# Patient Record
Sex: Female | Born: 1955
Health system: Southern US, Community
[De-identification: ages and names within clinical notes are randomized; demographics above are authoritative.]

## PROBLEM LIST (undated history)

## (undated) DIAGNOSIS — J449 Chronic obstructive pulmonary disease, unspecified: Secondary | ICD-10-CM

## (undated) DIAGNOSIS — M1990 Unspecified osteoarthritis, unspecified site: Secondary | ICD-10-CM

## (undated) DIAGNOSIS — I639 Cerebral infarction, unspecified: Secondary | ICD-10-CM

## (undated) DIAGNOSIS — Z9289 Personal history of other medical treatment: Secondary | ICD-10-CM

## (undated) DIAGNOSIS — I2510 Atherosclerotic heart disease of native coronary artery without angina pectoris: Secondary | ICD-10-CM

## (undated) DIAGNOSIS — M810 Age-related osteoporosis without current pathological fracture: Secondary | ICD-10-CM

## (undated) DIAGNOSIS — H409 Unspecified glaucoma: Secondary | ICD-10-CM

## (undated) DIAGNOSIS — IMO0002 Reserved for concepts with insufficient information to code with codable children: Secondary | ICD-10-CM

## (undated) DIAGNOSIS — I209 Angina pectoris, unspecified: Secondary | ICD-10-CM

## (undated) DIAGNOSIS — R1013 Epigastric pain: Secondary | ICD-10-CM

## (undated) DIAGNOSIS — F32A Depression, unspecified: Secondary | ICD-10-CM

## (undated) DIAGNOSIS — J45909 Unspecified asthma, uncomplicated: Secondary | ICD-10-CM

## (undated) DIAGNOSIS — E039 Hypothyroidism, unspecified: Secondary | ICD-10-CM

## (undated) DIAGNOSIS — I2699 Other pulmonary embolism without acute cor pulmonale: Secondary | ICD-10-CM

## (undated) DIAGNOSIS — K50811 Crohn's disease of both small and large intestine with rectal bleeding: Secondary | ICD-10-CM

## (undated) DIAGNOSIS — I669 Occlusion and stenosis of unspecified cerebral artery: Secondary | ICD-10-CM

## (undated) DIAGNOSIS — J309 Allergic rhinitis, unspecified: Secondary | ICD-10-CM

## (undated) DIAGNOSIS — E785 Hyperlipidemia, unspecified: Secondary | ICD-10-CM

## (undated) DIAGNOSIS — K219 Gastro-esophageal reflux disease without esophagitis: Secondary | ICD-10-CM

## (undated) DIAGNOSIS — I69922 Dysarthria following unspecified cerebrovascular disease: Secondary | ICD-10-CM

## (undated) DIAGNOSIS — I69919 Unspecified symptoms and signs involving cognitive functions following unspecified cerebrovascular disease: Secondary | ICD-10-CM

## (undated) DIAGNOSIS — H269 Unspecified cataract: Secondary | ICD-10-CM

## (undated) DIAGNOSIS — I10 Essential (primary) hypertension: Secondary | ICD-10-CM

## (undated) DIAGNOSIS — K5090 Crohn's disease, unspecified, without complications: Secondary | ICD-10-CM

## (undated) DIAGNOSIS — Z87442 Personal history of urinary calculi: Secondary | ICD-10-CM

## (undated) DIAGNOSIS — R0600 Dyspnea, unspecified: Secondary | ICD-10-CM

## (undated) DIAGNOSIS — C50919 Malignant neoplasm of unspecified site of unspecified female breast: Secondary | ICD-10-CM

## (undated) DIAGNOSIS — B001 Herpesviral vesicular dermatitis: Secondary | ICD-10-CM

## (undated) DIAGNOSIS — D689 Coagulation defect, unspecified: Secondary | ICD-10-CM

## (undated) DIAGNOSIS — F329 Major depressive disorder, single episode, unspecified: Secondary | ICD-10-CM

## (undated) DIAGNOSIS — Z923 Personal history of irradiation: Secondary | ICD-10-CM

## (undated) HISTORY — DX: Hyperlipidemia, unspecified: E78.5

## (undated) HISTORY — DX: Atherosclerotic heart disease of native coronary artery without angina pectoris: I25.10

## (undated) HISTORY — DX: Crohn's disease of both small and large intestine with rectal bleeding: K50.811

## (undated) HISTORY — PX: FRACTURE SURGERY: SHX138

## (undated) HISTORY — DX: Allergic rhinitis, unspecified: J30.9

## (undated) HISTORY — PX: BREAST SURGERY: SHX581

## (undated) HISTORY — DX: Unspecified glaucoma: H40.9

## (undated) HISTORY — DX: Other pulmonary embolism without acute cor pulmonale: I26.99

## (undated) HISTORY — DX: Major depressive disorder, single episode, unspecified: F32.9

## (undated) HISTORY — PX: EYE SURGERY: SHX253

## (undated) HISTORY — DX: Cerebral infarction, unspecified: I63.9

## (undated) HISTORY — DX: Malignant neoplasm of unspecified site of unspecified female breast: C50.919

## (undated) HISTORY — DX: Coagulation defect, unspecified: D68.9

## (undated) HISTORY — PX: CARDIAC CATHETERIZATION: SHX172

## (undated) HISTORY — DX: Occlusion and stenosis of unspecified cerebral artery: I66.9

## (undated) HISTORY — PX: TUBAL LIGATION: SHX77

## (undated) HISTORY — DX: Unspecified cataract: H26.9

## (undated) HISTORY — DX: Age-related osteoporosis without current pathological fracture: M81.0

## (undated) HISTORY — DX: Depression, unspecified: F32.A

## (undated) HISTORY — DX: Dysarthria following unspecified cerebrovascular disease: I69.922

## (undated) HISTORY — DX: Hypothyroidism, unspecified: E03.9

## (undated) HISTORY — DX: Personal history of other medical treatment: Z92.89

## (undated) HISTORY — DX: Gastro-esophageal reflux disease without esophagitis: K21.9

## (undated) HISTORY — DX: Herpesviral vesicular dermatitis: B00.1

## (undated) HISTORY — DX: Epigastric pain: R10.13

## (undated) HISTORY — DX: Essential (primary) hypertension: I10

## (undated) HISTORY — DX: Reserved for concepts with insufficient information to code with codable children: IMO0002

## (undated) HISTORY — PX: FINGER SURGERY: SHX640

## (undated) HISTORY — DX: Crohn's disease, unspecified, without complications: K50.90

## (undated) HISTORY — DX: Unspecified symptoms and signs involving cognitive functions following unspecified cerebrovascular disease: I69.919

## (undated) HISTORY — DX: Unspecified asthma, uncomplicated: J45.909

---

## 2004-08-17 ENCOUNTER — Ambulatory Visit: Payer: Self-pay | Admitting: General Surgery

## 2004-09-07 ENCOUNTER — Ambulatory Visit: Payer: Self-pay | Admitting: Oncology

## 2004-09-20 ENCOUNTER — Ambulatory Visit: Payer: Self-pay | Admitting: Oncology

## 2004-09-20 DIAGNOSIS — C50919 Malignant neoplasm of unspecified site of unspecified female breast: Secondary | ICD-10-CM

## 2004-09-20 DIAGNOSIS — Z923 Personal history of irradiation: Secondary | ICD-10-CM

## 2004-09-20 HISTORY — PX: BREAST BIOPSY: SHX20

## 2004-09-20 HISTORY — DX: Malignant neoplasm of unspecified site of unspecified female breast: C50.919

## 2004-09-20 HISTORY — DX: Personal history of irradiation: Z92.3

## 2004-09-20 HISTORY — PX: BREAST LUMPECTOMY: SHX2

## 2004-09-28 ENCOUNTER — Ambulatory Visit: Payer: Self-pay | Admitting: Family Medicine

## 2004-12-15 ENCOUNTER — Emergency Department: Payer: Self-pay | Admitting: Emergency Medicine

## 2004-12-18 ENCOUNTER — Ambulatory Visit: Payer: Self-pay | Admitting: Internal Medicine

## 2005-01-11 ENCOUNTER — Ambulatory Visit: Payer: Self-pay | Admitting: Internal Medicine

## 2005-01-28 ENCOUNTER — Ambulatory Visit: Payer: Self-pay | Admitting: Internal Medicine

## 2005-04-05 ENCOUNTER — Ambulatory Visit: Payer: Self-pay | Admitting: Oncology

## 2005-04-20 ENCOUNTER — Ambulatory Visit: Payer: Self-pay | Admitting: Oncology

## 2005-07-26 ENCOUNTER — Ambulatory Visit: Payer: Self-pay | Admitting: General Surgery

## 2005-12-27 ENCOUNTER — Ambulatory Visit: Payer: Self-pay | Admitting: Oncology

## 2006-01-18 ENCOUNTER — Ambulatory Visit: Payer: Self-pay | Admitting: Oncology

## 2006-05-15 ENCOUNTER — Observation Stay: Payer: Self-pay

## 2006-05-15 ENCOUNTER — Observation Stay (HOSPITAL_COMMUNITY): Admission: AD | Admit: 2006-05-15 | Discharge: 2006-05-17 | Payer: Self-pay | Admitting: Neurology

## 2006-07-15 ENCOUNTER — Ambulatory Visit: Payer: Self-pay | Admitting: Oncology

## 2006-07-27 ENCOUNTER — Ambulatory Visit: Payer: Self-pay | Admitting: General Surgery

## 2007-01-18 DIAGNOSIS — E785 Hyperlipidemia, unspecified: Secondary | ICD-10-CM | POA: Insufficient documentation

## 2007-01-18 DIAGNOSIS — E7800 Pure hypercholesterolemia, unspecified: Secondary | ICD-10-CM

## 2007-01-18 DIAGNOSIS — I10 Essential (primary) hypertension: Secondary | ICD-10-CM | POA: Insufficient documentation

## 2007-01-18 HISTORY — DX: Pure hypercholesterolemia, unspecified: E78.00

## 2007-01-19 ENCOUNTER — Ambulatory Visit: Payer: Self-pay | Admitting: Oncology

## 2007-02-01 ENCOUNTER — Other Ambulatory Visit: Payer: Self-pay

## 2007-02-01 ENCOUNTER — Emergency Department: Payer: Self-pay | Admitting: Emergency Medicine

## 2007-02-10 ENCOUNTER — Ambulatory Visit: Payer: Self-pay | Admitting: Oncology

## 2007-02-19 ENCOUNTER — Ambulatory Visit: Payer: Self-pay | Admitting: Oncology

## 2007-05-23 DIAGNOSIS — K219 Gastro-esophageal reflux disease without esophagitis: Secondary | ICD-10-CM | POA: Insufficient documentation

## 2007-09-21 ENCOUNTER — Ambulatory Visit: Payer: Self-pay | Admitting: Oncology

## 2007-09-28 ENCOUNTER — Ambulatory Visit: Payer: Self-pay | Admitting: Oncology

## 2007-10-02 ENCOUNTER — Ambulatory Visit: Payer: Self-pay | Admitting: Oncology

## 2007-10-22 ENCOUNTER — Ambulatory Visit: Payer: Self-pay | Admitting: Oncology

## 2008-03-20 ENCOUNTER — Ambulatory Visit: Payer: Self-pay | Admitting: Oncology

## 2008-04-03 ENCOUNTER — Ambulatory Visit: Payer: Self-pay | Admitting: Oncology

## 2008-04-20 ENCOUNTER — Ambulatory Visit: Payer: Self-pay | Admitting: Oncology

## 2008-06-12 DIAGNOSIS — E039 Hypothyroidism, unspecified: Secondary | ICD-10-CM | POA: Insufficient documentation

## 2008-06-20 ENCOUNTER — Ambulatory Visit: Payer: Self-pay | Admitting: Oncology

## 2008-10-21 ENCOUNTER — Ambulatory Visit: Payer: Self-pay | Admitting: Oncology

## 2008-11-04 ENCOUNTER — Ambulatory Visit: Payer: Self-pay | Admitting: Oncology

## 2008-11-08 ENCOUNTER — Ambulatory Visit: Payer: Self-pay | Admitting: Oncology

## 2008-11-18 ENCOUNTER — Ambulatory Visit: Payer: Self-pay | Admitting: Oncology

## 2009-04-28 DIAGNOSIS — I69359 Hemiplegia and hemiparesis following cerebral infarction affecting unspecified side: Secondary | ICD-10-CM | POA: Insufficient documentation

## 2009-05-01 ENCOUNTER — Ambulatory Visit: Payer: Self-pay | Admitting: Family Medicine

## 2009-05-21 ENCOUNTER — Ambulatory Visit: Payer: Self-pay | Admitting: Oncology

## 2009-05-22 ENCOUNTER — Ambulatory Visit: Payer: Self-pay | Admitting: Oncology

## 2009-06-20 ENCOUNTER — Ambulatory Visit: Payer: Self-pay | Admitting: Oncology

## 2009-10-20 DIAGNOSIS — M810 Age-related osteoporosis without current pathological fracture: Secondary | ICD-10-CM | POA: Insufficient documentation

## 2009-10-21 ENCOUNTER — Ambulatory Visit: Payer: Self-pay

## 2009-11-18 ENCOUNTER — Ambulatory Visit: Payer: Self-pay | Admitting: Oncology

## 2009-12-18 ENCOUNTER — Ambulatory Visit: Payer: Self-pay | Admitting: Oncology

## 2009-12-19 ENCOUNTER — Ambulatory Visit: Payer: Self-pay | Admitting: Oncology

## 2010-01-20 DIAGNOSIS — I69919 Unspecified symptoms and signs involving cognitive functions following unspecified cerebrovascular disease: Secondary | ICD-10-CM | POA: Insufficient documentation

## 2010-02-18 ENCOUNTER — Ambulatory Visit: Payer: Self-pay | Admitting: Oncology

## 2010-03-19 ENCOUNTER — Ambulatory Visit: Payer: Self-pay | Admitting: Oncology

## 2010-03-20 ENCOUNTER — Ambulatory Visit: Payer: Self-pay | Admitting: Oncology

## 2010-04-20 ENCOUNTER — Ambulatory Visit: Payer: Self-pay | Admitting: Oncology

## 2010-04-22 DIAGNOSIS — I69922 Dysarthria following unspecified cerebrovascular disease: Secondary | ICD-10-CM | POA: Insufficient documentation

## 2010-09-24 ENCOUNTER — Ambulatory Visit: Payer: Self-pay | Admitting: Oncology

## 2010-10-21 ENCOUNTER — Ambulatory Visit: Payer: Self-pay | Admitting: Oncology

## 2011-04-05 ENCOUNTER — Ambulatory Visit: Payer: Self-pay | Admitting: Oncology

## 2011-04-08 ENCOUNTER — Ambulatory Visit: Payer: Self-pay | Admitting: Oncology

## 2011-04-21 ENCOUNTER — Ambulatory Visit: Payer: Self-pay | Admitting: Oncology

## 2011-08-29 ENCOUNTER — Emergency Department: Payer: Self-pay | Admitting: Unknown Physician Specialty

## 2012-04-05 ENCOUNTER — Ambulatory Visit: Payer: Self-pay | Admitting: Oncology

## 2012-04-12 ENCOUNTER — Ambulatory Visit: Payer: Self-pay | Admitting: Oncology

## 2012-04-12 LAB — CBC CANCER CENTER
Basophil #: 0 x10 3/mm (ref 0.0–0.1)
Basophil %: 0.8 %
Eosinophil #: 0.2 x10 3/mm (ref 0.0–0.7)
Eosinophil %: 2.7 %
HCT: 36.7 % (ref 35.0–47.0)
HGB: 11.9 g/dL — ABNORMAL LOW (ref 12.0–16.0)
Lymphocyte #: 1.9 x10 3/mm (ref 1.0–3.6)
Lymphocyte %: 33.4 %
MCH: 28.6 pg (ref 26.0–34.0)
MCHC: 32.4 g/dL (ref 32.0–36.0)
MCV: 88 fL (ref 80–100)
Monocyte #: 0.5 x10 3/mm (ref 0.2–0.9)
Monocyte %: 9.2 %
Neutrophil #: 3 x10 3/mm (ref 1.4–6.5)
Neutrophil %: 53.9 %
Platelet: 186 x10 3/mm (ref 150–440)
RBC: 4.16 10*6/uL (ref 3.80–5.20)
RDW: 14.6 % — ABNORMAL HIGH (ref 11.5–14.5)
WBC: 5.7 x10 3/mm (ref 3.6–11.0)

## 2012-04-12 LAB — COMPREHENSIVE METABOLIC PANEL
Albumin: 3.6 g/dL (ref 3.4–5.0)
Alkaline Phosphatase: 88 U/L (ref 50–136)
Anion Gap: 6 — ABNORMAL LOW (ref 7–16)
BUN: 11 mg/dL (ref 7–18)
Bilirubin,Total: 0.3 mg/dL (ref 0.2–1.0)
Calcium, Total: 9.3 mg/dL (ref 8.5–10.1)
Chloride: 106 mmol/L (ref 98–107)
Co2: 30 mmol/L (ref 21–32)
Creatinine: 0.9 mg/dL (ref 0.60–1.30)
EGFR (African American): >60
EGFR (Non-African Amer.): >60
Glucose: 110 mg/dL — ABNORMAL HIGH (ref 65–99)
Osmolality: 283 (ref 275–301)
Potassium: 3.8 mmol/L (ref 3.5–5.1)
SGOT(AST): 16 U/L (ref 15–37)
SGPT (ALT): 28 U/L
Sodium: 142 mmol/L (ref 136–145)
Total Protein: 7.5 g/dL (ref 6.4–8.2)

## 2012-04-20 ENCOUNTER — Ambulatory Visit: Payer: Self-pay | Admitting: Oncology

## 2012-10-06 ENCOUNTER — Emergency Department: Payer: Self-pay | Admitting: Emergency Medicine

## 2012-10-07 LAB — COMPREHENSIVE METABOLIC PANEL
Albumin: 4.2 g/dL (ref 3.4–5.0)
Alkaline Phosphatase: 88 U/L (ref 50–136)
Anion Gap: 7 (ref 7–16)
BUN: 13 mg/dL (ref 7–18)
Bilirubin,Total: 0.4 mg/dL (ref 0.2–1.0)
Calcium, Total: 9.8 mg/dL (ref 8.5–10.1)
Chloride: 106 mmol/L (ref 98–107)
Co2: 26 mmol/L (ref 21–32)
Creatinine: 0.67 mg/dL (ref 0.60–1.30)
EGFR (African American): >60
EGFR (Non-African Amer.): >60
Glucose: 106 mg/dL — ABNORMAL HIGH (ref 65–99)
Osmolality: 278 (ref 275–301)
Potassium: 3.4 mmol/L — ABNORMAL LOW (ref 3.5–5.1)
SGOT(AST): 16 U/L (ref 15–37)
SGPT (ALT): 21 U/L (ref 12–78)
Sodium: 139 mmol/L (ref 136–145)
Total Protein: 7.9 g/dL (ref 6.4–8.2)

## 2012-10-07 LAB — CBC
HCT: 37.4 % (ref 35.0–47.0)
HGB: 12.5 g/dL (ref 12.0–16.0)
MCH: 28.8 pg (ref 26.0–34.0)
MCHC: 33.5 g/dL (ref 32.0–36.0)
MCV: 86 fL (ref 80–100)
Platelet: 202 10*3/uL (ref 150–440)
RBC: 4.35 10*6/uL (ref 3.80–5.20)
RDW: 14.6 % — ABNORMAL HIGH (ref 11.5–14.5)
WBC: 6.7 10*3/uL (ref 3.6–11.0)

## 2012-10-07 LAB — CK TOTAL AND CKMB (NOT AT ARMC)
CK, Total: 107 U/L (ref 21–215)
CK, Total: 97 U/L (ref 21–215)
CK-MB: 0.7 ng/mL (ref 0.5–3.6)
CK-MB: <0.5 ng/mL — ABNORMAL LOW (ref 0.5–3.6)

## 2012-10-07 LAB — TROPONIN I
Troponin-I: <0.02 ng/mL
Troponin-I: <0.02 ng/mL

## 2012-10-19 ENCOUNTER — Ambulatory Visit: Payer: Self-pay | Admitting: Internal Medicine

## 2012-10-25 ENCOUNTER — Ambulatory Visit: Payer: Self-pay | Admitting: Internal Medicine

## 2012-11-07 ENCOUNTER — Ambulatory Visit (INDEPENDENT_AMBULATORY_CARE_PROVIDER_SITE_OTHER): Payer: Medicare Other | Admitting: Pulmonary Disease

## 2012-11-07 ENCOUNTER — Encounter: Payer: Self-pay | Admitting: Pulmonary Disease

## 2012-11-07 VITALS — BP 122/64 | HR 96 | Temp 97.8°F | Ht 60.0 in | Wt 137.0 lb

## 2012-11-07 DIAGNOSIS — R0609 Other forms of dyspnea: Secondary | ICD-10-CM | POA: Insufficient documentation

## 2012-11-07 DIAGNOSIS — R0600 Dyspnea, unspecified: Secondary | ICD-10-CM | POA: Insufficient documentation

## 2012-11-07 DIAGNOSIS — R0602 Shortness of breath: Secondary | ICD-10-CM

## 2012-11-07 DIAGNOSIS — R911 Solitary pulmonary nodule: Secondary | ICD-10-CM

## 2012-11-07 MED ORDER — BUDESONIDE-FORMOTEROL FUMARATE 160-4.5 MCG/ACT IN AERO: 2.0000 | INHALATION_SPRAY | Freq: Two times a day (BID) | RESPIRATORY_TRACT | Status: DC

## 2012-11-07 MED ORDER — ALBUTEROL SULFATE HFA 108 (90 BASE) MCG/ACT IN AERS: 2.0000 | INHALATION_SPRAY | Freq: Four times a day (QID) | RESPIRATORY_TRACT | Status: DC | PRN

## 2012-11-07 NOTE — Assessment & Plan Note (Addendum)
Repeat CT scan of chest in 12 months (January 2015). We will need to order this on the next visit.

## 2012-11-07 NOTE — Progress Notes (Signed)
Subjective:    Patient ID: Carolyn Campbell, female    DOB: March 18, 1956, 57 y.o.   MRN: [id number]  HPI And is-year-old female with a past medical history significant for she and hypertension who comes to our clinic for evaluation of a fast respiratory rate. She states that she had no childhood respiratory illnesses and was diagnosed with asthma approximately 10 years ago. She tells me she never smoked cigarettes. She lives with a smoker. In the last 2 months she has developed increasing shortness of breath with exertion. She states that she can make it through the grocery store if she "leans on the basket". She has difficulty cleaning or doing things in the house. About one month ago she started noticing back and chest pain which radiated to her left arm. She was evaluated at [facility] and underwent a left heart catheterization echocardiogram which she tells me were normal. Her shortness of breath develop not long after this and she went to [facility] for a violation. She was evaluated there with a hospitalization which lasted one night. She ruled out for an MI, she underwent a CT MG a chest which was negative for pulmonary embolism but there was a small, 3 mm pulmonary nodule. She had normal chemistries with the exception of a slightly high calcium at 10.7. Her bicarbonate value was 24 on chemistries a [employer]. Her pro BNP was 27. Since her discharge at [facility] she has gone back for evaluation in the emergency room and reassured and told to go home. Her symptoms have persisted. She states that she gets short of breath with talking and with exertion. When she is at rest things feel better. She still has some chest and back tightness. She states that they're known terminal triggers which make her shortness of breath worse. It is mostly just with exertion and talking. She states that she took inhalers approximately 10 years ago around the time of her diagnosis of asthma. It did help with shortness of breath.  She's not been on any controller medications lately. She states that the breathing treatment she has received during hospitalizations recently have been beneficial.   Past Medical History  Diagnosis Date  . Asthma   . Hypertension   . Hyperlipidemia   . Stroke   . Allergic rhinitis      Family History  Problem Relation Age of Onset  . Heart disease Mother   . Breast cancer Sister      History   Social History  . Marital Status: Married    Spouse Name: N/A    Number of Children: N/A  . Years of Education: N/A   Occupational History  . Not on file.   Social History Main Topics  . Smoking status: Never Smoker   . Smokeless tobacco: Never Used  . Alcohol Use: No  . Drug Use: No  . Sexually Active: Not on file   Other Topics Concern  . Not on file   Social History Narrative  . No narrative on file     Allergies  Allergen Reactions  . Morphine And Related      No outpatient prescriptions prior to visit.   No facility-administered medications prior to visit.   Home medications  synthroid  benazapril  lipitor  alendrontate amolidipne Bayer zoloft   Review of Systems  Constitutional: Negative for fever and unexpected weight change.  HENT: Negative for ear pain, nosebleeds, congestion, sore throat, rhinorrhea, sneezing, trouble swallowing, dental problem, postnasal drip and  sinus pressure.   Eyes: Negative for redness and itching.  Respiratory: Positive for cough, chest tightness, shortness of breath and wheezing.   Cardiovascular: Negative for palpitations and leg swelling.  Gastrointestinal: Negative for nausea and vomiting.  Genitourinary: Negative for dysuria.  Musculoskeletal: Negative for joint swelling.  Skin: Negative for rash.  Neurological: Negative for headaches.  Hematological: Does not bruise/bleed easily.  Psychiatric/Behavioral: Negative for dysphoric mood. The patient is not nervous/anxious.        Objective:   Physical  Exam  Filed Vitals:   11/07/12 1543  BP: 122/64  Pulse: 96  Temp: 97.8 F (36.6 C)  TempSrc: Oral  Height: 5' (1.524 m)  Weight: 137 lb (62.143 kg)  SpO2: 100%  room air  Gen: no acute distress when I enter the room, tachypnic with speaking in HEENT: NCAT, PERRL, EOMi, OP clear, neck supple without masses PULM: CTA B, no accessory muscle use, no paradoxical breathing CV: RRR, no mgr, no JVD AB: BS+, soft, nontender, no hsm Ext: warm, no edema, no clubbing, no cyanosis Derm: no rash or skin breakdown Neuro: A&Ox4, left facial droop, strength 5/5 hand grip, hip flexion R side; 4/5 for both of these on L   January 2014 left heart catheterization by Dr. Jerrye Beavers normal per patient January 2014 echocardiogram by Dr. Jerrye Beavers normal per patient January 2014 CT angio chest at [facility] no evidence of pulmonary embolism, 3 mm right upper lobe pulmonary nodule, no lymphadenopathy January 2014 labwork at [facility] includes a pro BNP value of 27, hemoglobin of 12.7, d-dimer of 741 (elevated) calcium of 10.7 bicarbonate 24 (normal)  11/07/2012 simple spirometry poor patient effort, uninterpretable     Assessment & Plan:   Shortness of breath It is unclear to me what is causing Carolyn Campbell shortness of breath. She has had an extensive evaluation in the last month including a left heart catheterization, a CT angiogram chest, and an echocardiogram which apparently are all normal. I am reassured that there is no clear evidence of pulmonary parenchymal disease seen on the CT scan of her chest. We need to obtain records from Dr. Donnamarie Rossetti to confirm that the left heart catheterization and the echocardiogram are normal.  She was not able to fully cooperate with the simple spirometry test today and so it's results are uninterpretable.  Though she exhibits increased work of breathing on occasion while speaking with me she uses no accessory muscles and does not show signs of diaphragmatic  weakness and that there is no paradoxical breathing. When I leave the room the breathing difficulty goes away and she is breathing comfortably when I come back. She has no signs of neuromuscular weakness on physical exam.   Perhaps we are dealing with a restrictive lung disease which is best evaluated with full pulmonary function testing.  The next best test to evaluate her shortness of breath is a full pulmonary function tests with lung volumes. We will request that this be ordered at [facility].  Because she has a history of asthma I will add Symbicort twice a day and when necessary albuterol to see if this helps with her subjective dyspnea. To be honest, because she is not wheezing I do not think these will make much of a difference.  Overall I am encouraged by the extensive negative work up she has received in the last few months, but I am uncertain as to the etiology of her symptoms.   Plan : -Trial of  Symbicort twice a day -Trial of albuterol when necessary -Full pulmonary function testing at [facility] -Return to clinic in 4 weeks  Solitary pulmonary nodule Repeat CT scan of chest in 12 months (January 2015). We will need to order this on the next visit.    Updated Medication List Outpatient Encounter Prescriptions as of 11/07/2012  Medication Sig Dispense Refill  . alendronate (FOSAMAX) 70 MG tablet Take 1 tablet by mouth once a week.      Marland Kitchen amLODipine (NORVASC) 10 MG tablet Take 1 tablet by mouth daily.      Marland Kitchen aspirin 81 MG tablet Take 81 mg by mouth daily.      Marland Kitchen atorvastatin (LIPITOR) 40 MG tablet Take 1 tablet by mouth daily.      . benazepril (LOTENSIN) 20 MG tablet Take 1 tablet by mouth daily.      Marland Kitchen levothyroxine (SYNTHROID, LEVOTHROID) 50 MCG tablet Take 1 tablet by mouth daily.      . sertraline (ZOLOFT) 100 MG tablet Take 100 mg by mouth daily.      Marland Kitchen albuterol (PROVENTIL HFA;VENTOLIN HFA) 108 (90 BASE) MCG/ACT inhaler  Inhale 2 puffs into the lungs every 6 (six) hours as needed for wheezing.  1 Inhaler  0  . budesonide-formoterol (SYMBICORT) 160-4.5 MCG/ACT inhaler Inhale 2 puffs into the lungs 2 (two) times daily.  1 Inhaler  0   No facility-administered encounter medications on file as of 11/07/2012.

## 2012-11-07 NOTE — Patient Instructions (Signed)
Take the symbicort inhaler two puffs twice a day no matter how you feel Pick up a prescription for albuterol and use two puffs every four hours as needed We will send you to [facility] for a breathing test Go to the ER if your breathing gets worse We will see you back in one month or sooner if needed

## 2012-11-07 NOTE — Assessment & Plan Note (Addendum)
It is unclear to me what is causing Carolyn Campbell shortness of breath. She has had an extensive evaluation in the last month including a left heart catheterization, a CT angiogram chest, and an echocardiogram which apparently are all normal. I am reassured that there is no clear evidence of pulmonary parenchymal disease seen on the CT scan of her chest. We need to obtain records from Dr. Donnamarie Rossetti to confirm that the left heart catheterization and the echocardiogram are normal.  She was not able to fully cooperate with the simple spirometry test today and so it's results are uninterpretable.  Though she exhibits increased work of breathing on occasion while speaking with me she uses no accessory muscles and does not show signs of diaphragmatic weakness and that there is no paradoxical breathing. When I leave the room the breathing difficulty goes away and she is breathing comfortably when I come back. She has no signs of neuromuscular weakness on physical exam.   Perhaps we are dealing with a restrictive lung disease which is best evaluated with full pulmonary function testing.  The next best test to evaluate her shortness of breath is a full pulmonary function tests with lung volumes. We will request that this be ordered at [facility].  Because she has a history of asthma I will add Symbicort twice a day and when necessary albuterol to see if this helps with her subjective dyspnea. To be honest, because she is not wheezing I do not think these will make much of a difference.  Overall I am encouraged by the extensive negative work up she has received in the last few months, but I am uncertain as to the etiology of her symptoms.   Plan : -Trial of Symbicort twice a day -Trial of albuterol when necessary -Full pulmonary function testing at [facility] -Return to clinic in 4 weeks

## 2012-11-08 ENCOUNTER — Telehealth: Payer: Self-pay | Admitting: *Deleted

## 2012-11-08 NOTE — Telephone Encounter (Signed)
Error

## 2012-11-14 ENCOUNTER — Ambulatory Visit: Payer: Self-pay | Admitting: Pulmonary Disease

## 2012-11-14 LAB — PULMONARY FUNCTION TEST

## 2012-11-17 ENCOUNTER — Telehealth: Payer: Self-pay | Admitting: Pulmonary Disease

## 2012-11-17 MED ORDER — BUDESONIDE-FORMOTEROL FUMARATE 160-4.5 MCG/ACT IN AERO: 2.0000 | INHALATION_SPRAY | Freq: Two times a day (BID) | RESPIRATORY_TRACT | Status: DC

## 2012-11-17 NOTE — Telephone Encounter (Signed)
Rx has been sent in, pt is aware. 

## 2012-11-22 ENCOUNTER — Encounter: Payer: Self-pay | Admitting: Pulmonary Disease

## 2012-12-05 ENCOUNTER — Encounter: Payer: Self-pay | Admitting: Pulmonary Disease

## 2012-12-05 ENCOUNTER — Ambulatory Visit (INDEPENDENT_AMBULATORY_CARE_PROVIDER_SITE_OTHER): Payer: Medicare Other | Admitting: Pulmonary Disease

## 2012-12-05 VITALS — BP 120/70 | HR 91 | Temp 98.9°F | Ht 60.0 in | Wt 138.4 lb

## 2012-12-05 DIAGNOSIS — R0602 Shortness of breath: Secondary | ICD-10-CM

## 2012-12-05 DIAGNOSIS — R911 Solitary pulmonary nodule: Secondary | ICD-10-CM

## 2012-12-05 NOTE — Assessment & Plan Note (Signed)
It is unclear to me exactly what caused her elevated respiratory rate in January and February.  She had an extensive work up that was normal.    She is better now that she is on Symbicort, but it is not clear to me that she has any degree of obstructive airways disease.  She really needs PFT's and a methacholine challenge test but she requests that we not order any more tests now due to her extensive medical bills.  I seriously question whether or not her tachypnea in January/February 2014 was psychogenic.  It is really not possible for someone to have a legitimate, organic, underlying progressive process driving tachypnea for weeks on end without eventual respiratory failure.  Plan: -needs PFTs when she can afford it, spirometry minimal; ideally full PFT with methacholine challenge -continue Symbicort for now, try to change to inhaled corticosteroid alone next visit if still well

## 2012-12-05 NOTE — Assessment & Plan Note (Signed)
A 62m nodule was seen on her January 2014 CT chest.  We will order a repeat for 12 months time (Jan 2015).

## 2012-12-05 NOTE — Progress Notes (Signed)
Subjective:    Patient ID: Carolyn Campbell, female    DOB: 10/12/1955, 57 y.o.   MRN: [id number]  Synopsis: Carolyn Campbell first saw the [facility] office in February 2014 after multiple hospitalizations and ER visits for tachypnea. She had been seen at both [facility] and [facility] and had an extensive workup which was entirely negative. This included a left heart catheterization, CT angiogram chest multiple chest x-rays, and an echocardiogram. In February 2014 when she saw [place] for the first time the [facility] Campbell clinic she was noted to be tachypneic when the examiner was in the room but this apparently subsided undergone. She was unable to complete pulmonary function testing due to her tachypnea. She was started on Symbicort empirically because she had a distant history of asthma.   HPI  12/05/2012 routine office visit--Carolyn Campbell has been doing well since her last visit. She is taking the Symbicort twice a day and states that it has helped greatly with her shortness of breath and wheezing. She has only had to use albuterol 2 times since the last visit. She states that she sometimes get short of breath when she exerts her self but in general she is much better than before. She is not exercising on a regular basis and states that she does not feel as strong as she did when she previously participated in a regular exercise routine. She is not coughing. She has not had weight loss, fever, or chills. She is currently suffering from an episode of gastroenteritis causing diarrhea.  Past Medical History  Diagnosis Date  . Asthma   . Hypertension   . Hyperlipidemia   . Stroke   . Allergic rhinitis      Review of Systems  Constitutional: Negative for fever, chills and fatigue.  HENT: Negative for congestion, rhinorrhea and postnasal drip.   Respiratory: Negative for cough, shortness of breath and wheezing.   Cardiovascular: Negative for chest pain, palpitations and  leg swelling.  Gastrointestinal: Positive for nausea, vomiting and diarrhea.       Objective:   Physical Exam  Filed Vitals:   12/05/12 1607  BP: 120/70  Pulse: 91  Temp: 98.9 F (37.2 C)  TempSrc: Oral  Height: 5' (1.524 m)  Weight: 138 lb 6.4 oz (62.778 kg)  SpO2: 98%   Room Air  Gen: well appearing, no acute distress HEENT: NCAT, EOMi, OP clear, PULM: CTA B CV: RRR, no mgr, no JVD AB: BS+, soft, nontender, no hsm Ext: warm, no edema, no clubbing, no cyanosis  January 2014 left heart catheterization by Dr. Jerrye Beavers normal per patient January 2014 echocardiogram by Dr. Jerrye Beavers at normal per patient January 2014 CT angio chest at [facility] no evidence of pulmonary embolism, 3 mm right upper lobe pulmonary nodule, no lymphadenopathy January 2014 labwork at [facility] includes a pro BNP value of 27, hemoglobin of 12.7, d-dimer of 741 (elevated) calcium of 10.7 bicarbonate 24 (normal) 11/07/2012 simple spirometry poor patient effort, uninterpretable 11/14/2012 unable to complete PFTs, did not follow commands      Assessment & Plan:   Solitary pulmonary nodule A 29m nodule was seen on her January 2014 CT chest.  We will order a repeat for 12 months time (Jan 2015).  Shortness of breath It is unclear to me exactly what caused her elevated respiratory rate in January and February.  She had an extensive work up that was normal.    She is better now that  she is on Symbicort, but it is not clear to me that she has any degree of obstructive airways disease.  She really needs PFT's and a methacholine challenge test but she requests that we not order any more tests now due to her extensive medical bills.  I seriously question whether or not her tachypnea in January/February 2014 was psychogenic.  It is really not possible for someone to have a legitimate, organic, underlying progressive process driving tachypnea for weeks on end without eventual respiratory  failure.  Plan: -needs PFTs when she can afford it, spirometry minimal; ideally full PFT with methacholine challenge -continue Symbicort for now, try to change to inhaled corticosteroid alone next visit if still well   Updated Medication List Outpatient Encounter Prescriptions as of 12/05/2012  Medication Sig Dispense Refill  . albuterol (PROVENTIL HFA;VENTOLIN HFA) 108 (90 BASE) MCG/ACT inhaler Inhale 2 puffs into the lungs every 6 (six) hours as needed for wheezing.  1 Inhaler  0  . alendronate (FOSAMAX) 70 MG tablet Take 1 tablet by mouth once a week.      Marland Kitchen amLODipine (NORVASC) 10 MG tablet Take 1 tablet by mouth daily.      Marland Kitchen aspirin 81 MG tablet Take 81 mg by mouth daily.      Marland Kitchen atorvastatin (LIPITOR) 40 MG tablet Take 1 tablet by mouth daily.      . benazepril (LOTENSIN) 20 MG tablet Take 1 tablet by mouth daily.      . budesonide-formoterol (SYMBICORT) 160-4.5 MCG/ACT inhaler Inhale 2 puffs into the lungs 2 (two) times daily.  1 Inhaler  5  . levothyroxine (SYNTHROID, LEVOTHROID) 50 MCG tablet Take 1 tablet by mouth daily.      . sertraline (ZOLOFT) 100 MG tablet Take 100 mg by mouth daily.       No facility-administered encounter medications on file as of 12/05/2012.

## 2012-12-05 NOTE — Patient Instructions (Signed)
We will have you get a CT scan in January 2014 to evaluate the pulmonary nodule Keep taking the Symbicort as you are doing We will see you back in 3-4 months or sooner if needed

## 2012-12-11 ENCOUNTER — Encounter: Payer: Self-pay | Admitting: Pulmonary Disease

## 2012-12-12 ENCOUNTER — Encounter: Payer: Self-pay | Admitting: Pulmonary Disease

## 2012-12-18 ENCOUNTER — Telehealth: Payer: Self-pay | Admitting: Pulmonary Disease

## 2012-12-18 MED ORDER — BUDESONIDE-FORMOTEROL FUMARATE 160-4.5 MCG/ACT IN AERO: 2.0000 | INHALATION_SPRAY | Freq: Two times a day (BID) | RESPIRATORY_TRACT | Status: DC

## 2012-12-18 NOTE — Telephone Encounter (Signed)
I spoke with pt. She is needing rx for symbicort sent to [facility]. rx has been printed off. Magda Paganini will have Dr. Lake Bells sign this and will fax this tomorrow in [place]. Pt aware. Nothing further was needed

## 2013-03-06 ENCOUNTER — Ambulatory Visit: Payer: Medicare Other | Admitting: Pulmonary Disease

## 2013-04-02 ENCOUNTER — Ambulatory Visit: Payer: Medicare Other | Admitting: Pulmonary Disease

## 2013-04-24 ENCOUNTER — Encounter: Payer: Self-pay | Admitting: Pulmonary Disease

## 2013-04-24 ENCOUNTER — Ambulatory Visit (INDEPENDENT_AMBULATORY_CARE_PROVIDER_SITE_OTHER): Payer: Medicare Other | Admitting: Pulmonary Disease

## 2013-04-24 VITALS — BP 140/72 | HR 72 | Temp 98.3°F | Ht 60.0 in | Wt 141.0 lb

## 2013-04-24 DIAGNOSIS — R0602 Shortness of breath: Secondary | ICD-10-CM

## 2013-04-24 DIAGNOSIS — R911 Solitary pulmonary nodule: Secondary | ICD-10-CM

## 2013-04-24 NOTE — Patient Instructions (Signed)
We will order a CT scan of your chest at [facility] in January 2015 to evaluate the pulmonary nodule  Stop taking the Symbicort and replace it with the Aerospan inhaler I gave you.  Take one puff twice a day for a month.  At the end of the month let us know how you are doing.  If you are doing OK on it then let us know so that we can call in a prescription for this new medicine.  If you are not better then resume the symbicort regularly  We will see you back in January 2015

## 2013-04-24 NOTE — Assessment & Plan Note (Signed)
Symptoms been stable since the last visit. It is possible she has mild asthma. She cannot afford a methacholine challenge which I think would provide useful information. Notably, she has never really given as acceptable pulmonary function testing.  Plan: -We will try a trial of inhaled corticosteroid alone, she will call us if she does okay on this. -If her symptoms worsen on the inhaled corticosteroid and she is to restart Symbicort twice a day and use it regularly.

## 2013-04-24 NOTE — Assessment & Plan Note (Signed)
Repeat CT chest in January 2015

## 2013-04-24 NOTE — Progress Notes (Signed)
Subjective:    Patient ID: Carolyn Campbell, female    DOB: Sep 26, 1955, 57 y.o.   MRN: [id number]  Synopsis: Carolyn Campbell first saw the [facility] office in February 2014 after multiple hospitalizations and ER visits for tachypnea. She had been seen at both [facility] and [facility] and had an extensive workup which was entirely negative. This included a left heart catheterization, CT angiogram chest multiple chest x-rays, and an echocardiogram. In February 2014 when she saw [place] for the first time the [facility] clinic she was noted to be tachypneic when the examiner was in the room but this apparently subsided undergone. She was unable to complete pulmonary function testing due to her tachypnea. She was started on Symbicort empirically because she had a distant history of asthma.   HPI   12/05/2012 routine office visit--Carolyn Campbell has been doing well since her last visit. She is taking the Symbicort twice a day and states that it has helped greatly with her shortness of breath and wheezing. She has only had to use albuterol 2 times since the last visit. She states that she sometimes get short of breath when she exerts her self but in general she is much better than before. She is not exercising on a regular basis and states that she does not feel as strong as she did when she previously participated in a regular exercise routine. She is not coughing. She has not had weight loss, fever, or chills. She is currently suffering from an episode of gastroenteritis causing diarrhea.  04/24/2013 ROV >> This is been a stable interval for Carolyn Campbell. She is only using the Symbicort on occasion and usually at night. She does not take it regularly and only uses as needed. Her breathing has been better since when I first met her in February 2014. She has not had use her rescue inhaler. Sometimes she feels that she cannot breathe very well and she is lying on her left side.    Past  Medical History  Diagnosis Date  . Asthma   . Hypertension   . Hyperlipidemia   . Stroke   . Allergic rhinitis      Review of Systems  Constitutional: Negative for fever, chills and fatigue.  HENT: Negative for congestion, rhinorrhea and postnasal drip.   Respiratory: Negative for cough, shortness of breath and wheezing.   Cardiovascular: Negative for chest pain, palpitations and leg swelling.  Gastrointestinal: Positive for nausea, vomiting and diarrhea.       Objective:   Physical Exam   Filed Vitals:   04/24/13 1000  BP: 140/72  Pulse: 72  Temp: 98.3 F (36.8 C)  TempSrc: Oral  Height: 5' (1.524 m)  Weight: 141 lb (63.957 kg)  SpO2: 97%   Room Air  Gen: well appearing, no acute distress HEENT: NCAT, EOMi, OP clear,  PULM: CTA B CV: RRR, no mgr, no JVD AB: BS+, soft, nontender, no hsm Ext: warm, no edema, no clubbing, no cyanosis  January 2014 left heart catheterization by Dr. Jerrye Beavers normal per patient January 2014 echocardiogram by Dr. Jerrye Beavers at normal per patient January 2014 CT angio chest at [facility] no evidence of pulmonary embolism, 3 mm right upper lobe pulmonary nodule, no lymphadenopathy January 2014 labwork at [facility] includes a pro BNP value of 27, hemoglobin of 12.7, d-dimer of 741 (elevated) calcium of 10.7 bicarbonate 24 (normal) 11/07/2012 simple spirometry poor patient effort, uninterpretable 11/14/2012 unable to complete PFTs, did  not follow commands      Assessment & Plan:   Shortness of breath Symptoms been stable since the last visit. It is possible she has mild asthma. She cannot afford a methacholine challenge which I think would provide useful information. Notably, she has never really given as acceptable pulmonary function testing.  Plan: -We will try a trial of inhaled corticosteroid alone, she will call us if she does okay on this. -If her symptoms worsen on the inhaled corticosteroid and she is to restart  Symbicort twice a day and use it regularly.  Solitary pulmonary nodule Repeat CT chest in January 2015    Updated Medication List Outpatient Encounter Prescriptions as of 04/24/2013  Medication Sig Dispense Refill  . albuterol (PROVENTIL HFA;VENTOLIN HFA) 108 (90 BASE) MCG/ACT inhaler Inhale 2 puffs into the lungs every 6 (six) hours as needed for wheezing.  1 Inhaler  0  . amLODipine (NORVASC) 10 MG tablet Take 1 tablet by mouth daily.      Marland Kitchen aspirin 81 MG tablet Take 81 mg by mouth daily.      Marland Kitchen atorvastatin (LIPITOR) 40 MG tablet Take 1 tablet by mouth daily.      . benazepril (LOTENSIN) 20 MG tablet Take 1 tablet by mouth daily.      . budesonide-formoterol (SYMBICORT) 160-4.5 MCG/ACT inhaler Inhale 2 puffs into the lungs 2 (two) times daily.  1 Inhaler  6  . levothyroxine (SYNTHROID, LEVOTHROID) 50 MCG tablet Take 1 tablet by mouth daily.      . sertraline (ZOLOFT) 100 MG tablet Take 100 mg by mouth daily.      . [DISCONTINUED] alendronate (FOSAMAX) 70 MG tablet Take 1 tablet by mouth once a week.       No facility-administered encounter medications on file as of 04/24/2013.

## 2013-04-27 ENCOUNTER — Telehealth: Payer: Self-pay | Admitting: Pulmonary Disease

## 2013-04-27 NOTE — Telephone Encounter (Signed)
Spoke with patient-- Patient seen by Dr. Kendrick Fries 04/24/13 Patient Instructions    We will order a CT scan of your chest at [facility] in January 2015 to evaluate the pulmonary nodule  Stop taking the Symbicort and replace it with the Aerospan inhaler I gave you. Take one puff twice a day for a month. At the end of the month let us know how you are doing. If you are doing OK on it then let us know so that we can call in a prescription for this new medicine. If you are not better then resume the symbicort regularly  We will see you back in January 2015   ------------- Pt states every time she uses the aerospan she coughs Today she woke up with sore throat unsure if she is catching a cold or if this is related to [place] Dr. Maple Hudson please advise as Dr. Kendrick Fries is out of the office, thank you  Allergies  Allergen Reactions  . Morphine And Related    Current Outpatient Prescriptions on File Prior to Visit  Medication Sig Dispense Refill  . albuterol (PROVENTIL HFA;VENTOLIN HFA) 108 (90 BASE) MCG/ACT inhaler Inhale 2 puffs into the lungs every 6 (six) hours as needed for wheezing.  1 Inhaler  0  . amLODipine (NORVASC) 10 MG tablet Take 1 tablet by mouth daily.      Marland Kitchen aspirin 81 MG tablet Take 81 mg by mouth daily.      Marland Kitchen atorvastatin (LIPITOR) 40 MG tablet Take 1 tablet by mouth daily.      . benazepril (LOTENSIN) 20 MG tablet Take 1 tablet by mouth daily.      . budesonide-formoterol (SYMBICORT) 160-4.5 MCG/ACT inhaler Inhale 2 puffs into the lungs 2 (two) times daily.  1 Inhaler  6  . levothyroxine (SYNTHROID, LEVOTHROID) 50 MCG tablet Take 1 tablet by mouth daily.      . sertraline (ZOLOFT) 100 MG tablet Take 100 mg by mouth daily.       No current facility-administered medications on file prior to visit.

## 2013-04-27 NOTE — Telephone Encounter (Signed)
Per CDY:  If she still has symbicort, go back to that 2 puffs and rinse bid until we can reach Dr. Kendrick Fries next week.    -------  Carolyn Campbell, spoke with pt.  Informed her of above per CY.  She verbalized understanding of this and does still have symbicort.  She will go back to symbicort 2 puffs bid until instructed further.  Will send msg to Dr. Kendrick Fries to advise on further recs.

## 2013-05-01 NOTE — Telephone Encounter (Signed)
noted 

## 2013-05-29 ENCOUNTER — Ambulatory Visit: Payer: Self-pay | Admitting: Family Medicine

## 2013-07-26 ENCOUNTER — Other Ambulatory Visit: Payer: Self-pay

## 2013-08-20 ENCOUNTER — Telehealth: Payer: Self-pay | Admitting: *Deleted

## 2013-08-20 DIAGNOSIS — R911 Solitary pulmonary nodule: Secondary | ICD-10-CM

## 2013-08-20 NOTE — Telephone Encounter (Signed)
Message copied by Christen Butter on Mon Aug 20, 2013 10:22 AM ------      Message from: Christen Butter      Created: Tue Dec 05, 2012  4:23 PM       Pt neds non con ct chest in Jan 2015 to f/u pulmonary nodule ------

## 2013-08-20 NOTE — Telephone Encounter (Signed)
Order sent to [facility] for ct chest in Jan 2015.

## 2013-09-20 HISTORY — PX: ESOPHAGOGASTRODUODENOSCOPY: SHX1529

## 2013-09-20 HISTORY — PX: GIVENS CAPSULE STUDY: SHX5432

## 2013-09-20 HISTORY — PX: COLONOSCOPY: SHX174

## 2013-09-24 ENCOUNTER — Telehealth: Payer: Self-pay | Admitting: Pulmonary Disease

## 2013-09-24 NOTE — Telephone Encounter (Signed)
Spoke with pt. She reports she doesn't drive to [facility], so she needs her CT cancelled that is scheduled over at [facility] on 10/01/13 and r/s to be done in [place]. Please advise PCC's thanks

## 2013-09-25 NOTE — Telephone Encounter (Signed)
pcc's aware and precert has been issused for [employer]

## 2013-09-28 ENCOUNTER — Telehealth: Payer: Self-pay | Admitting: Pulmonary Disease

## 2013-10-01 ENCOUNTER — Other Ambulatory Visit: Payer: Medicare Other

## 2013-10-01 NOTE — Telephone Encounter (Signed)
Per [employer] pt has to see pcp, pt has appt today, then they have to do a referral for dr [facility] and once that is done precert can be completed,in the mean time ct has been moved to 10/04/13@11 :30am@[facility]  pt is aware of this process Joellen Jersey

## 2013-10-08 ENCOUNTER — Encounter: Payer: Self-pay | Admitting: Pulmonary Disease

## 2013-10-09 ENCOUNTER — Telehealth: Payer: Self-pay | Admitting: Pulmonary Disease

## 2013-10-10 NOTE — Telephone Encounter (Signed)
Had to reschedule the ct for [email or web address]  location 10/12/13@4 :00pm pt has been notified Joellen Jersey

## 2013-10-12 ENCOUNTER — Ambulatory Visit: Payer: Self-pay | Admitting: Pulmonary Disease

## 2013-10-19 ENCOUNTER — Encounter: Payer: Self-pay | Admitting: Pulmonary Disease

## 2013-10-19 ENCOUNTER — Ambulatory Visit (INDEPENDENT_AMBULATORY_CARE_PROVIDER_SITE_OTHER): Payer: Medicare HMO | Admitting: Pulmonary Disease

## 2013-10-19 VITALS — BP 122/68 | HR 75 | Temp 98.0°F | Ht 60.0 in | Wt 138.0 lb

## 2013-10-19 DIAGNOSIS — R0602 Shortness of breath: Secondary | ICD-10-CM

## 2013-10-19 DIAGNOSIS — D3500 Benign neoplasm of unspecified adrenal gland: Secondary | ICD-10-CM

## 2013-10-19 DIAGNOSIS — R911 Solitary pulmonary nodule: Secondary | ICD-10-CM

## 2013-10-19 LAB — BASIC METABOLIC PANEL
BUN: 13 mg/dL (ref 6–23)
CO2: 30 mEq/L (ref 19–32)
Calcium: 9.6 mg/dL (ref 8.4–10.5)
Chloride: 103 mEq/L (ref 96–112)
Creatinine, Ser: 0.7 mg/dL (ref 0.4–1.2)
GFR: 104.61 mL/min (ref >60.00)
Glucose, Bld: 101 mg/dL — ABNORMAL HIGH (ref 70–99)
Potassium: 3.9 mEq/L (ref 3.5–5.1)
Sodium: 139 mEq/L (ref 135–145)

## 2013-10-19 MED ORDER — ALBUTEROL SULFATE HFA 108 (90 BASE) MCG/ACT IN AERS: 2.0000 | INHALATION_SPRAY | Freq: Four times a day (QID) | RESPIRATORY_TRACT | Status: DC | PRN

## 2013-10-19 NOTE — Assessment & Plan Note (Signed)
This was seen on the CT of her chest and was incompletely imaged. Plan: -CT abdomen with contrast to evaluate further

## 2013-10-19 NOTE — Progress Notes (Signed)
Subjective:    Patient ID: Carolyn Campbell, female    DOB: [birth year], 58 y.o.   MRN: [id number]  Synopsis: [name] first saw the [facility] office in February 2014 after multiple hospitalizations and ER visits for tachypnea. She had been seen at both [facility] and [facility] and had an extensive workup which was entirely negative. This included a left heart catheterization, CT angiogram chest multiple chest x-rays, and an echocardiogram. In February 2014 when she saw [place] for the first time the [facility] clinic she was noted to be tachypneic when the examiner was in the room but this apparently subsided undergone. She was unable to complete pulmonary function testing due to her tachypnea. She was started on Symbicort empirically because she had a distant history of asthma.   HPI   12/05/2012 routine office visit--Carolyn Campbell has been doing well since her last visit. She is taking the Symbicort twice a day and states that it has helped greatly with her shortness of breath and wheezing. She has only had to use albuterol 2 times since the last visit. She states that she sometimes get short of breath when she exerts her self but in general she is much better than before. She is not exercising on a regular basis and states that she does not feel as strong as she did when she previously participated in a regular exercise routine. She is not coughing. She has not had weight loss, fever, or chills. She is currently suffering from an episode of gastroenteritis causing diarrhea.  04/24/2013 ROV >> This is been a stable interval for [name]. She is only using the Symbicort on occasion and usually at night. She does not take it regularly and only uses as needed. Her breathing has been better since when I first met her in February 2014. She has not had use her rescue inhaler. Sometimes she feels that she cannot breathe very well and she is lying on her left side.  10/19/2013  ROV >> Carolyn Campbell says that her breathing is better since the last visit. She sometimes feels like hard candy gets stuck in her throat and sometimes if she "speaks out loud" she gets choked.  She still gets some shortness of breath with exercise like fast walking and climbing a flight of stairs. She sometimes get hoarseness. She continues to use the symbicort two puffs twice a day.  She sometimes misses an occassional dose maybe twice a week.  She tried another inhaler after the last visit that made her cough more.   Past Medical History  Diagnosis Date  . Asthma   . Hypertension   . Hyperlipidemia   . Stroke   . Allergic rhinitis      Review of Systems  Constitutional: Negative for fever, chills and fatigue.  HENT: Negative for congestion, postnasal drip and rhinorrhea.   Respiratory: Negative for cough, shortness of breath and wheezing.   Cardiovascular: Negative for chest pain, palpitations and leg swelling.  Gastrointestinal: Positive for nausea, vomiting and diarrhea.       Objective:   Physical Exam   Filed Vitals:   10/19/13 0933  BP: 122/68  Pulse: 75  Temp: 98 F (36.7 C)  TempSrc: Oral  Height: 5' (1.524 m)  Weight: 138 lb (62.596 kg)  SpO2: 97%   Room Air  Gen: well appearing, no acute distress HEENT: NCAT, EOMi, OP clear,  PULM: CTA B CV: RRR, no mgr, no JVD AB: BS+, soft, nontender, no  hsm Ext: warm, no edema, no clubbing, no cyanosis  January 2014 left heart catheterization by Dr. Jerrye Beavers normal per patient January 2014 echocardiogram by Dr. Jerrye Beavers at normal per patient January 2014 CT angio chest at [facility] no evidence of pulmonary embolism, 3 mm right upper lobe pulmonary nodule, no lymphadenopathy January 2014 labwork at [facility] includes a pro BNP value of 27, hemoglobin of 12.7, d-dimer of 741 (elevated) calcium of 10.7 bicarbonate 24 (normal) 11/07/2012 simple spirometry poor patient effort, uninterpretable 11/14/2012 unable to complete  PFTs, did not follow commands      Assessment & Plan:   Solitary pulmonary nodule Resolved  Adrenal adenoma This was seen on the CT of her chest and was incompletely imaged. Plan: -CT abdomen with contrast to evaluate further  Shortness of breath It is really not clear to me if she has asthma or not.  I strongly suspect VCD and or other anxiety related issues as she has had a lengthy work up which was negative and her exams and vitals are always normal.  If she has asthma it is mild at best and it is currently over treated with the strong dose of symbicort.  Plan: -stop symbicort -use Aerospan inhaler 2 puffs twice a day -if this doesn't work, go back to Symbicort 80/4.5 (NOT 160/4.5) -instructed on Albuterol PRN use and prescribed -goal 2015 is to get to prn albuterol alone    Updated Medication List Outpatient Encounter Prescriptions as of 10/19/2013  Medication Sig  . albuterol (PROVENTIL HFA;VENTOLIN HFA) 108 (90 BASE) MCG/ACT inhaler Inhale 2 puffs into the lungs every 6 (six) hours as needed for wheezing.  Marland Kitchen amLODipine (NORVASC) 10 MG tablet Take 1 tablet by mouth daily.  Marland Kitchen aspirin 81 MG tablet Take 81 mg by mouth daily.  Marland Kitchen atorvastatin (LIPITOR) 40 MG tablet Take 1 tablet by mouth daily.  . benazepril (LOTENSIN) 20 MG tablet Take 1 tablet by mouth daily.  . budesonide-formoterol (SYMBICORT) 160-4.5 MCG/ACT inhaler Inhale 2 puffs into the lungs 2 (two) times daily.  Marland Kitchen levothyroxine (SYNTHROID, LEVOTHROID) 50 MCG tablet Take 1 tablet by mouth daily.  . sertraline (ZOLOFT) 100 MG tablet Take 100 mg by mouth daily.

## 2013-10-19 NOTE — Patient Instructions (Signed)
Stop symbicort Start using the Aerospan two puffs twice a day no matter how you feel Let us know how you are doing on the [place] and we will send in a new prescription for this  Use the albuterol (rescue) inhaler 2 puffs every 4-6 hours AS NEEDED FOR SHORTNESS OF BREATH  We will order a contrasted CT of your abdomen to evaluate the adrenal nodule  We will see you back in 3-4 months or sooner if needed

## 2013-10-19 NOTE — Assessment & Plan Note (Signed)
Resolved

## 2013-10-19 NOTE — Assessment & Plan Note (Addendum)
It is really not clear to me if she has asthma or not.  I strongly suspect VCD and or other anxiety related issues as she has had a lengthy work up which was negative and her exams and vitals are always normal.  If she has asthma it is mild at best and it is currently over treated with the strong dose of symbicort.  Plan: -stop symbicort -use Aerospan inhaler 2 puffs twice a day -if this doesn't work, go back to Symbicort 80/4.5 (NOT 160/4.5) -instructed on Albuterol PRN use and prescribed -goal 2015 is to get to prn albuterol alone

## 2013-10-22 NOTE — Addendum Note (Signed)
Addended by: Len Blalock on: 10/22/2013 01:51 PM   Modules accepted: Orders

## 2013-10-30 ENCOUNTER — Telehealth: Payer: Self-pay | Admitting: Pulmonary Disease

## 2013-10-30 NOTE — Telephone Encounter (Signed)
Will forward to [facility]

## 2013-10-31 ENCOUNTER — Telehealth: Payer: Self-pay | Admitting: Pulmonary Disease

## 2013-10-31 DIAGNOSIS — D3500 Benign neoplasm of unspecified adrenal gland: Secondary | ICD-10-CM

## 2013-10-31 NOTE — Telephone Encounter (Signed)
lmomtcb x1 for teresa

## 2013-10-31 NOTE — Telephone Encounter (Signed)
Rec'd a call from [facility] outpatient [facility] regarding pt's CT.  Needs a different order for tomorrow's CT.  [name] can be reached at [phone].  [name]

## 2013-10-31 NOTE — Telephone Encounter (Signed)
I spoke with Juliann Pulse and she states that they are needing the precert for the pt CT for tomorrow. I spoke with Golden Circle and she has Psychologist, counselling for [employer] and will work on this and will [employer]. I will forward the note to [facility]. [place] Bing, CMA

## 2013-10-31 NOTE — Telephone Encounter (Signed)
Needs to speak to libby in ref to this pt says she left a vm also can be reached at [phone].Elnita Maxwell

## 2013-10-31 NOTE — Telephone Encounter (Signed)
  Spoke with [name] and the radiologists and he is asking that the CT abdomen be done without contrast as well as with. This is what is best to view what is needed. Order placed and Golden Circle is aware to do precert. [place] Bing, CMA I ATC diane x3, NA, no voicemail. WCB. [place] Bing, CMA

## 2013-10-31 NOTE — Telephone Encounter (Signed)
Auth# [id number] given to Damaris Hippo Ottinger

## 2013-11-01 NOTE — Telephone Encounter (Signed)
Received numerous call from [facility] this has been taken care of order refaxed and precert resent to [email or web address]  is aware of this spoke to Peri Maris

## 2013-11-07 ENCOUNTER — Telehealth: Payer: Self-pay | Admitting: Pulmonary Disease

## 2013-11-07 NOTE — Telephone Encounter (Signed)
This has been rescheduled to 11/14/13 @[facility]  Carolyn Campbell

## 2013-11-09 ENCOUNTER — Ambulatory Visit: Payer: Self-pay | Admitting: Pulmonary Disease

## 2013-11-12 ENCOUNTER — Emergency Department: Payer: Self-pay | Admitting: Emergency Medicine

## 2013-11-12 ENCOUNTER — Telehealth: Payer: Self-pay | Admitting: Pulmonary Disease

## 2013-11-12 LAB — URINALYSIS, COMPLETE
Bacteria: NONE SEEN
Bilirubin,UR: NEGATIVE
Blood: NEGATIVE
Glucose,UR: NEGATIVE mg/dL (ref 0–75)
Leukocyte Esterase: NEGATIVE
Nitrite: NEGATIVE
Ph: 6 (ref 4.5–8.0)
Protein: NEGATIVE
RBC,UR: 1 /HPF (ref 0–5)
Specific Gravity: 1.026 (ref 1.003–1.030)
Squamous Epithelial: <1
WBC UR: <1 /HPF (ref 0–5)

## 2013-11-12 LAB — COMPREHENSIVE METABOLIC PANEL
Albumin: 4.2 g/dL (ref 3.4–5.0)
Alkaline Phosphatase: 106 U/L
Anion Gap: 4 — ABNORMAL LOW (ref 7–16)
BUN: 13 mg/dL (ref 7–18)
Bilirubin,Total: 0.5 mg/dL (ref 0.2–1.0)
Calcium, Total: 9.8 mg/dL (ref 8.5–10.1)
Chloride: 103 mmol/L (ref 98–107)
Co2: 30 mmol/L (ref 21–32)
Creatinine: 0.81 mg/dL (ref 0.60–1.30)
EGFR (African American): >60
EGFR (Non-African Amer.): >60
Glucose: 96 mg/dL (ref 65–99)
Osmolality: 274 (ref 275–301)
Potassium: 3.5 mmol/L (ref 3.5–5.1)
SGOT(AST): 29 U/L (ref 15–37)
SGPT (ALT): 22 U/L (ref 12–78)
Sodium: 137 mmol/L (ref 136–145)
Total Protein: 8.4 g/dL — ABNORMAL HIGH (ref 6.4–8.2)

## 2013-11-12 LAB — CBC WITH DIFFERENTIAL/PLATELET
Basophil #: 0.1 10*3/uL (ref 0.0–0.1)
Basophil %: 0.9 %
Eosinophil #: 0.1 10*3/uL (ref 0.0–0.7)
Eosinophil %: 1.5 %
HCT: 41.6 % (ref 35.0–47.0)
HGB: 13.3 g/dL (ref 12.0–16.0)
Lymphocyte #: 2.3 10*3/uL (ref 1.0–3.6)
Lymphocyte %: 35.9 %
MCH: 27.9 pg (ref 26.0–34.0)
MCHC: 31.9 g/dL — ABNORMAL LOW (ref 32.0–36.0)
MCV: 87 fL (ref 80–100)
Monocyte #: 0.6 x10 3/mm (ref 0.2–0.9)
Monocyte %: 9.3 %
Neutrophil #: 3.4 10*3/uL (ref 1.4–6.5)
Neutrophil %: 52.4 %
Platelet: 252 10*3/uL (ref 150–440)
RBC: 4.76 10*6/uL (ref 3.80–5.20)
RDW: 14.5 % (ref 11.5–14.5)
WBC: 6.4 10*3/uL (ref 3.6–11.0)

## 2013-11-12 NOTE — Telephone Encounter (Signed)
lmomtcb x1 for pt 

## 2013-11-13 NOTE — Telephone Encounter (Signed)
I called Carolyn Campbell to discuss the results of her CT which showed no adrenal mass but one gallstone.  She has already undergone an ultrasound in the last 2 days as she went to the ED with belly pain.  She plans to see her PCP this week to discuss further.  Agreed with this plan.

## 2013-11-20 ENCOUNTER — Encounter: Payer: Self-pay | Admitting: *Deleted

## 2013-11-27 ENCOUNTER — Encounter: Payer: Self-pay | Admitting: Pulmonary Disease

## 2013-11-28 ENCOUNTER — Ambulatory Visit: Payer: Self-pay | Admitting: General Surgery

## 2013-12-03 ENCOUNTER — Ambulatory Visit: Payer: Self-pay | Admitting: Urgent Care

## 2013-12-03 ENCOUNTER — Encounter: Payer: Self-pay | Admitting: Pulmonary Disease

## 2013-12-10 ENCOUNTER — Ambulatory Visit: Payer: Self-pay | Admitting: Gastroenterology

## 2014-05-06 ENCOUNTER — Ambulatory Visit: Payer: Self-pay | Admitting: Gastroenterology

## 2014-05-08 ENCOUNTER — Other Ambulatory Visit: Payer: Self-pay | Admitting: Urgent Care

## 2014-05-20 ENCOUNTER — Other Ambulatory Visit: Payer: Self-pay | Admitting: Urgent Care

## 2014-05-21 LAB — HM MAMMOGRAPHY: HM Mammogram: NORMAL (ref 0–4)

## 2014-05-23 ENCOUNTER — Other Ambulatory Visit: Payer: Self-pay | Admitting: Urgent Care

## 2014-05-23 LAB — COMPREHENSIVE METABOLIC PANEL
Albumin: 3.6 g/dL (ref 3.4–5.0)
Alkaline Phosphatase: 107 U/L
Anion Gap: 6 — ABNORMAL LOW (ref 7–16)
BUN: 9 mg/dL (ref 7–18)
Bilirubin,Total: 0.4 mg/dL (ref 0.2–1.0)
Calcium, Total: 9.3 mg/dL (ref 8.5–10.1)
Chloride: 107 mmol/L (ref 98–107)
Co2: 28 mmol/L (ref 21–32)
Creatinine: 0.71 mg/dL (ref 0.60–1.30)
EGFR (African American): >60
EGFR (Non-African Amer.): >60
Glucose: 104 mg/dL — ABNORMAL HIGH (ref 65–99)
Osmolality: 280 (ref 275–301)
Potassium: 3.5 mmol/L (ref 3.5–5.1)
SGOT(AST): 11 U/L — ABNORMAL LOW (ref 15–37)
SGPT (ALT): 18 U/L
Sodium: 141 mmol/L (ref 136–145)
Total Protein: 7.7 g/dL (ref 6.4–8.2)

## 2014-05-30 ENCOUNTER — Ambulatory Visit: Payer: Self-pay | Admitting: Family Medicine

## 2014-08-19 ENCOUNTER — Ambulatory Visit: Payer: Self-pay | Admitting: Urgent Care

## 2014-08-19 LAB — CBC WITH DIFFERENTIAL/PLATELET
Basophil #: 0 10*3/uL (ref 0.0–0.1)
Basophil %: 0.8 %
Eosinophil #: 0.1 10*3/uL (ref 0.0–0.7)
Eosinophil %: 2.9 %
HCT: 38.8 % (ref 35.0–47.0)
HGB: 12.3 g/dL (ref 12.0–16.0)
Lymphocyte #: 1.6 10*3/uL (ref 1.0–3.6)
Lymphocyte %: 32 %
MCH: 27.9 pg (ref 26.0–34.0)
MCHC: 31.7 g/dL — ABNORMAL LOW (ref 32.0–36.0)
MCV: 88 fL (ref 80–100)
Monocyte #: 0.4 x10 3/mm (ref 0.2–0.9)
Monocyte %: 7.9 %
Neutrophil #: 2.8 10*3/uL (ref 1.4–6.5)
Neutrophil %: 56.4 %
Platelet: 209 10*3/uL (ref 150–440)
RBC: 4.4 10*6/uL (ref 3.80–5.20)
RDW: 14.7 % — ABNORMAL HIGH (ref 11.5–14.5)
WBC: 5 10*3/uL (ref 3.6–11.0)

## 2014-08-19 LAB — COMPREHENSIVE METABOLIC PANEL
Albumin: 3.7 g/dL (ref 3.4–5.0)
Alkaline Phosphatase: 97 U/L
Anion Gap: 8 (ref 7–16)
BUN: 10 mg/dL (ref 7–18)
Bilirubin,Total: 0.4 mg/dL (ref 0.2–1.0)
Calcium, Total: 9.1 mg/dL (ref 8.5–10.1)
Chloride: 106 mmol/L (ref 98–107)
Co2: 28 mmol/L (ref 21–32)
Creatinine: 0.75 mg/dL (ref 0.60–1.30)
EGFR (African American): >60
EGFR (Non-African Amer.): >60
Glucose: 99 mg/dL (ref 65–99)
Osmolality: 282 (ref 275–301)
Potassium: 3.5 mmol/L (ref 3.5–5.1)
SGOT(AST): 25 U/L (ref 15–37)
SGPT (ALT): 23 U/L
Sodium: 142 mmol/L (ref 136–145)
Total Protein: 7.7 g/dL (ref 6.4–8.2)

## 2014-10-16 ENCOUNTER — Encounter: Payer: Self-pay | Admitting: Internal Medicine

## 2014-12-04 ENCOUNTER — Encounter: Payer: Self-pay | Admitting: Internal Medicine

## 2014-12-04 ENCOUNTER — Other Ambulatory Visit (INDEPENDENT_AMBULATORY_CARE_PROVIDER_SITE_OTHER): Payer: Commercial Managed Care - HMO

## 2014-12-04 ENCOUNTER — Other Ambulatory Visit (HOSPITAL_COMMUNITY): Payer: Self-pay | Admitting: Internal Medicine

## 2014-12-04 ENCOUNTER — Ambulatory Visit (INDEPENDENT_AMBULATORY_CARE_PROVIDER_SITE_OTHER): Payer: Commercial Managed Care - HMO | Admitting: Internal Medicine

## 2014-12-04 VITALS — BP 140/70 | HR 72 | Ht 60.0 in | Wt 131.2 lb

## 2014-12-04 DIAGNOSIS — K50811 Crohn's disease of both small and large intestine with rectal bleeding: Secondary | ICD-10-CM

## 2014-12-04 DIAGNOSIS — R1313 Dysphagia, pharyngeal phase: Secondary | ICD-10-CM

## 2014-12-04 DIAGNOSIS — K50112 Crohn's disease of large intestine with intestinal obstruction: Secondary | ICD-10-CM

## 2014-12-04 DIAGNOSIS — R1314 Dysphagia, pharyngoesophageal phase: Secondary | ICD-10-CM

## 2014-12-04 HISTORY — DX: Crohn's disease of both small and large intestine with rectal bleeding: K50.811

## 2014-12-04 LAB — COMPREHENSIVE METABOLIC PANEL
ALT: 14 U/L (ref 0–35)
AST: 16 U/L (ref 0–37)
Albumin: 4.3 g/dL (ref 3.5–5.2)
Alkaline Phosphatase: 101 U/L (ref 39–117)
BUN: 7 mg/dL (ref 6–23)
CO2: 35 mEq/L — ABNORMAL HIGH (ref 19–32)
Calcium: 9.6 mg/dL (ref 8.4–10.5)
Chloride: 102 mEq/L (ref 96–112)
Creatinine, Ser: 0.72 mg/dL (ref 0.40–1.20)
GFR: 105.89 mL/min (ref >60.00)
Glucose, Bld: 101 mg/dL — ABNORMAL HIGH (ref 70–99)
Potassium: 3.6 mEq/L (ref 3.5–5.1)
Sodium: 142 mEq/L (ref 135–145)
Total Bilirubin: 0.6 mg/dL (ref 0.2–1.2)
Total Protein: 7.3 g/dL (ref 6.0–8.3)

## 2014-12-04 LAB — CBC WITH DIFFERENTIAL/PLATELET
Basophils Absolute: 0 10*3/uL (ref 0.0–0.1)
Basophils Relative: 0.4 % (ref 0.0–3.0)
Eosinophils Absolute: 0.1 10*3/uL (ref 0.0–0.7)
Eosinophils Relative: 1.7 % (ref 0.0–5.0)
HCT: 36 % (ref 36.0–46.0)
Hemoglobin: 12.2 g/dL (ref 12.0–15.0)
Lymphocytes Relative: 29.4 % (ref 12.0–46.0)
Lymphs Abs: 1.5 10*3/uL (ref 0.7–4.0)
MCHC: 33.7 g/dL (ref 30.0–36.0)
MCV: 84.1 fl (ref 78.0–100.0)
Monocytes Absolute: 0.5 10*3/uL (ref 0.1–1.0)
Monocytes Relative: 8.7 % (ref 3.0–12.0)
Neutro Abs: 3.1 10*3/uL (ref 1.4–7.7)
Neutrophils Relative %: 59.8 % (ref 43.0–77.0)
Platelets: 207 10*3/uL (ref 150.0–400.0)
RBC: 4.28 Mil/uL (ref 3.87–5.11)
RDW: 15 % (ref 11.5–15.5)
WBC: 5.2 10*3/uL (ref 4.0–10.5)

## 2014-12-04 LAB — HEPATITIS B SURFACE ANTIGEN: Hepatitis B Surface Ag: NEGATIVE

## 2014-12-04 LAB — HEPATITIS B CORE ANTIBODY, TOTAL: Hep B Core Total Ab: NONREACTIVE

## 2014-12-04 NOTE — Patient Instructions (Signed)
Your physician has requested that you go to the basement for lab work before leaving today.  You have been scheduled for a modified barium swallow on March 23rd at 11:00. Please arrive 15 minutes prior to your test for registration. You will go to [facility] (1st Floor) for your appointment. . Should you need to cancel or reschedule your appointment, please contact [phone] [place]) or [phone] [place]). _____________________________________________________________________ A Modified Barium Swallow Study, or MBS, is a special x-ray that is taken to check swallowing skills. It is carried out by a Stage manager and a Psychologist, clinical (SLP). During this test, yourmouth, throat, and esophagus, a muscular tube which connects your mouth to your stomach, is checked. The test will help you, your doctor, and the SLP plan what types of foods and liquids are easier for you to swallow. The SLP will also identify positions and ways to help you swallow more easily and safely. What will happen during an MBS? You will be taken to an x-ray room and seated comfortably. You will be asked to swallow small amounts of food and liquid mixed with barium. Barium is a liquid or paste that allows images of your mouth, throat and esophagus to be seen on x-ray. The x-ray captures moving images of the food you are swallowing as it travels from your mouth through your throat and into your esophagus. This test helps identify whether food or liquid is entering your lungs (aspiration). The test also shows which part of your mouth or throat lacks strength or coordination to move the food or liquid in the right direction. This test typically takes 30 minutes to 1 hour to complete. _______________________________________________________________________  I appreciate the opportunity to care for you. Silvano Rusk, M.D., [facility]

## 2014-12-04 NOTE — Progress Notes (Signed)
Referred by Magnus Sinning, MD Subjective:  Cc: Crohn's, swallowing problems  Patient ID: Carolyn Campbell, female    DOB: Nov 11, 1955, 59 y.o.   MRN: [id number]  HPI Patient is a very nice middle-aged black woman here with her husband. She was diagnosed with Crohn's disease over the past year. Her cup was performed and I reviewed it. Dr. Allen Norris has performed EGD, colonoscopy and capsule endoscopy of the small bowel. She had been complaining of upper abdominal pain after eating, and rectal bleeding. Colonoscopy on 12/10/2013 demonstrated scattered areas of ulcerated mucosa without bleeding stigmata in the entire colon. He had a sessile polyp in the sigmoid colon 4 mm. Terminal ileum wasn't well. EGD demonstrated a hiatal hernia and gastritis. Pathology specimens revealed active proctitis with architectural features of chronicity on rectal biopsy. Random colon biopsies in the right and left colon showed melanosis coli. Duodenal biopsies were negative. Mild chronic gastritis and erosion on gastric biopsies. Capsule endoscopy has shown distal small bowel ulcerations. Images viewed as well as images from the endoscopy and colonoscopy. C-reactive protein in August was high at 7.4. She had IBD diagnostic serologies significant for being consistent with Crohn's disease. She has had slight anemia. Hemoglobin in the 11 range.  She has been through with Pentasa, it sounds like Entocort was used as well and she is also been on Canasa suppositories most recently for about a month. She continues to have intermittent passage of bloody mucus. It might be better but it is a persistent problem. She has significant upper abdominal pain after eating and bloating and distention. She takes 4 g of Pentasa in the form of 2 g twice a day. She has on a prescription program because of the expense of that. She does not seem to have much diarrhea if at all.  Additionally she has a problem where she has like a sensation of something being  stuck in her throat in the right neck all the time. She coughs when she talks at times and feels like she is swallowing or aspirating saliva and this happens with liquids at times as well. Does not seem to have any difficulty swallowing food in particular. Allergies  Allergen Reactions  . Morphine And Related    Outpatient Prescriptions Prior to Visit  Medication Sig Dispense Refill  . amLODipine (NORVASC) 10 MG tablet Take 1 tablet by mouth daily.    Marland Kitchen atorvastatin (LIPITOR) 40 MG tablet Take 1 tablet by mouth daily.    . benazepril (LOTENSIN) 20 MG tablet Take 1 tablet by mouth daily.    Marland Kitchen levothyroxine (SYNTHROID, LEVOTHROID) 50 MCG tablet Take 1 tablet by mouth daily.    . sertraline (ZOLOFT) 100 MG tablet Take 100 mg by mouth daily.    Marland Kitchen albuterol (PROVENTIL HFA;VENTOLIN HFA) 108 (90 BASE) MCG/ACT inhaler Inhale 2 puffs into the lungs every 6 (six) hours as needed for wheezing. 1 Inhaler 0  . aspirin 81 MG tablet Take 81 mg by mouth daily.    . budesonide-formoterol (SYMBICORT) 160-4.5 MCG/ACT inhaler Inhale 2 puffs into the lungs 2 (two) times daily. 1 Inhaler 6   No facility-administered medications prior to visit.   Past Medical History  Diagnosis Date  . Asthma   . Hypertension   . Hyperlipidemia   . Allergic rhinitis   . Breast cancer   . Esophageal reflux   . Hypothyroidism   . Osteoporosis   . Occlusion, cerebral artery     NOS w/infarction  . 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 Cognitive deficits as late effect of cerebrovascular disease   . Glaucoma     vitreous degeneration  . Crohn's disease of both small and large intestine with rectal bleeding 12/04/2014   Past Surgical History  Procedure Laterality Date  . Tubal ligation    . Breast surgery Left     malignant biopsy  . Esophagogastroduodenoscopy  2015  . Colonoscopy  2015  . Givens capsule study  2015   History   Social History  . Marital Status: Married    Spouse Name: N/A  .  Number of Children: N/A  . Years of Education: N/A   Social History Main Topics  . Smoking status: Never Smoker   . Smokeless tobacco: Never Used  . Alcohol Use: No  . Drug Use: No  . Sexual Activity: Not on file   Other Topics Concern  . None   Social History Narrative   Married. One son and one daughter. She is disabled after she had breast cancer and strokes. 2 caffeinated beverages daily.   Family History  Problem Relation Age of Onset  . Heart disease Mother   . Breast cancer Sister   . Alcohol abuse Father   . Cerebrovascular Accident Father   . Hypertension    . Diabetes    . Heart attack Mother    Review of Systems As per HPI. Chronic mild RLE weakness after stroke. Some fatigue. All other ROS negative.    Objective:   Physical Exam BP 140/70 mmHg  Pulse 72  Ht 5' (1.524 m)  Wt 131 lb 3.2 oz (59.512 kg)  BMI 25.62 kg/m2 General:  Well-developed, well-nourished and in no acute distress Eyes:  anicteric. ENT:   Mouth and posterior pharynx free of lesions.  Neck:   supple w/o thyromegaly or mass.  Lungs: Clear to auscultation bilaterally. Heart:  S1S2, no rubs, murmurs, gallops. Abdomen:  soft, non-tender, no hepatosplenomegaly, hernia, or mass and BS+.  Rectal:  Carolyn Campbell, Bowbells present.  NL anoderm Slightly decreased rectal tone at rest, formed brown stool and no mass  Lymph:  no cervical or supraclavicular adenopathy. Extremities:   no edema Skin   no rash. Neuro:  A&O x 3.  Psych:  appropriate mood and  Affect.   Data Reviewed: Xrays, labs, GI notes, colonoscopy with images and pathology, EGD with images and pathology, capsule endoscopy small bowel with images, all from 2015-16. See HPI also.       Assessment & Plan:  Crohn's disease of both small and large intestine with rectal bleeding  The diagnosis and treatment to date have been appropriate I think. However, I think it is time to change therapy - she is not adequately responding to  mesalamine therapy. I reviewed immunomodulators (azathioprine, 6 MP) and biologic therapy (Remicade, Humira, Cimzia) with her and husband today. Explained that these all increase risk of serious infections, possibly hematopoetic malignancies also. Risk of CNS damage also. She is inclined to pursue more aggressive therapy since quality of life not adeqyuate and disease not controlloed (signs and sxs).  CBC, CMET, TSH Hep B S Ag and quantiferon TB testing today in anticipation of starting biologic Tx. Will call results and decide Tx   Pharyngeal dysphagia  Modified ba swallow has been ordered to evaluate this problem.   I appreciate the opportunity to care for this patient. XT:[email or web address],IOEVOJ, MD

## 2014-12-06 ENCOUNTER — Other Ambulatory Visit: Payer: Self-pay

## 2014-12-06 ENCOUNTER — Other Ambulatory Visit: Payer: Commercial Managed Care - HMO

## 2014-12-06 DIAGNOSIS — K50119 Crohn's disease of large intestine with unspecified complications: Secondary | ICD-10-CM

## 2014-12-09 LAB — QUANTIFERON TB GOLD ASSAY (BLOOD)
Interferon Gamma Release Assay: NEGATIVE
Mitogen value: 2.17 IU/mL
Quantiferon Nil Value: 0.15 IU/mL
Quantiferon Tb Ag Minus Nil Value: 0.09 IU/mL
TB Ag value: 0.24 IU/mL

## 2014-12-11 ENCOUNTER — Ambulatory Visit (HOSPITAL_COMMUNITY)
Admission: RE | Admit: 2014-12-11 | Discharge: 2014-12-11 | Disposition: A | Discharge status: Home / Self Care | Payer: Commercial Managed Care - HMO | Source: Ambulatory Visit | Attending: Internal Medicine | Admitting: Internal Medicine

## 2014-12-11 DIAGNOSIS — E039 Hypothyroidism, unspecified: Secondary | ICD-10-CM | POA: Diagnosis not present

## 2014-12-11 DIAGNOSIS — K219 Gastro-esophageal reflux disease without esophagitis: Secondary | ICD-10-CM | POA: Insufficient documentation

## 2014-12-11 DIAGNOSIS — J45909 Unspecified asthma, uncomplicated: Secondary | ICD-10-CM | POA: Insufficient documentation

## 2014-12-11 DIAGNOSIS — R1312 Dysphagia, oropharyngeal phase: Secondary | ICD-10-CM | POA: Diagnosis not present

## 2014-12-11 DIAGNOSIS — I10 Essential (primary) hypertension: Secondary | ICD-10-CM | POA: Diagnosis not present

## 2014-12-11 DIAGNOSIS — K50811 Crohn's disease of both small and large intestine with rectal bleeding: Secondary | ICD-10-CM

## 2014-12-11 DIAGNOSIS — K5080 Crohn's disease of both small and large intestine without complications: Secondary | ICD-10-CM | POA: Insufficient documentation

## 2014-12-11 DIAGNOSIS — R1310 Dysphagia, unspecified: Secondary | ICD-10-CM | POA: Diagnosis present

## 2014-12-11 DIAGNOSIS — I69922 Dysarthria following unspecified cerebrovascular disease: Secondary | ICD-10-CM | POA: Diagnosis not present

## 2014-12-11 DIAGNOSIS — E785 Hyperlipidemia, unspecified: Secondary | ICD-10-CM | POA: Diagnosis not present

## 2014-12-11 DIAGNOSIS — M810 Age-related osteoporosis without current pathological fracture: Secondary | ICD-10-CM | POA: Diagnosis not present

## 2014-12-11 DIAGNOSIS — R1314 Dysphagia, pharyngoesophageal phase: Secondary | ICD-10-CM

## 2014-12-11 DIAGNOSIS — R1313 Dysphagia, pharyngeal phase: Secondary | ICD-10-CM

## 2014-12-11 DIAGNOSIS — K449 Diaphragmatic hernia without obstruction or gangrene: Secondary | ICD-10-CM | POA: Diagnosis not present

## 2014-12-11 DIAGNOSIS — I6991 Cognitive deficits following unspecified cerebrovascular disease: Secondary | ICD-10-CM | POA: Insufficient documentation

## 2014-12-11 DIAGNOSIS — K50112 Crohn's disease of large intestine with intestinal obstruction: Secondary | ICD-10-CM

## 2014-12-11 NOTE — Procedures (Signed)
Objective Swallowing Evaluation: Modified Barium Swallowing Study  Patient Details  Name: Carolyn Campbell MRN: [id number] Date of Birth: 02/24/1956  Today's Date: 12/11/2014 Time: SLP Start Time (ACUTE ONLY): 1110-SLP Stop Time (ACUTE ONLY): 1130 SLP Time Calculation (min) (ACUTE ONLY): 20 min  Past Medical History:  Past Medical History  Diagnosis Date  . Asthma   . Hypertension   . Hyperlipidemia   . Allergic rhinitis   . Breast cancer   . Esophageal reflux   . Hypothyroidism   . Osteoporosis   . Occlusion, cerebral artery     NOS w/infarction  . 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 Cognitive deficits as late effect of cerebrovascular disease   . Glaucoma     vitreous degeneration  . Crohn's disease of both small and large intestine with rectal bleeding 12/04/2014   Past Surgical History:  Past Surgical History  Procedure Laterality Date  . Tubal ligation    . Breast surgery Left     malignant biopsy  . Esophagogastroduodenoscopy  2015  . Colonoscopy  2015  . Givens capsule study  2015   HPI:  HPI: 59 yr old seen for outpatient MBS accompanied by husband. PMH includes CVA 2008, EGD revealing hiatal hernia and gastritis, Crohns. Pt complains of pharyngeal globus sensation, coughing with thin liquids and sometimes saliva.   No Data Recorded  Assessment / Plan / Recommendation CHL IP CLINICAL IMPRESSIONS 12/11/2014  Dysphagia Diagnosis Mild oral phase dysphagia;Mild pharyngeal phase dysphagia  Clinical impression Pt exhibits mild sensory based pharyngeal dysphagia characterized by decreased sensation leading to swallow initiation at the pyriform sinuses. Pt appears to be compensating for decreased oral cohesion and control due to hesitation and brief holding of bolus in oral cavity prior to initiating posterior transfer. Mild residue in vallecular and pyriform sinsus residue due to reduced tongue base retraction and laryngeal elevation. Airway was protected  without penetration or aspiration during this assessment. Esophagus briefly scanned with observation of pill stopping mid esophagus not transited by liquid, however puree consistency facilitated transfer to stomach. Educated pt to use precaution and take small sips, straws allowed, swallow 2 times after every several bites and sips, esophageal precautions and bites puree following pills. Regular diet texture and thin liqiuds recommended.       CHL IP TREATMENT RECOMMENDATION 12/11/2014  Treatment Plan Recommendations No treatment recommended at this time     CHL IP DIET RECOMMENDATION 12/11/2014  Diet Recommendations Regular;Thin liquid  Liquid Administration via Cup;Straw  Medication Administration Whole meds with liquid  Compensations Slow rate;Small sips/bites;Multiple dry swallows after each bite/sip;Follow solids with liquid  Postural Changes and/or Swallow Maneuvers Seated upright 90 degrees;Upright 30-60 min after meal     CHL IP OTHER RECOMMENDATIONS 12/11/2014  Recommended Consults (None)  Oral Care Recommendations Oral care BID  Other Recommendations (None)     CHL IP FOLLOW UP RECOMMENDATIONS 12/11/2014  Follow up Recommendations None     No flowsheet data found.   Pertinent Vitals/Pain none    SLP Swallow Goals No flowsheet data found.  No flowsheet data found.    CHL IP REASON FOR REFERRAL 12/11/2014  Reason for Referral Objectively evaluate swallowing function     CHL IP ORAL PHASE 12/11/2014  Lips (None)  Tongue (None)  Mucous membranes (None)  Nutritional status (None)  Other (None)  Oxygen therapy (None)  Oral Phase Impaired  Oral - Pudding Teaspoon (None)  Oral - Pudding Cup (None)  Oral - Honey Teaspoon (None)  Oral - Honey Cup (None)  Oral - Honey Syringe (None)  Oral - Nectar Teaspoon (None)  Oral - Nectar Cup (None)  Oral - Nectar Straw (None)  Oral - Nectar Syringe (None)  Oral - Ice Chips (None)  Oral - Thin Teaspoon (None)  Oral - Thin Cup  Delayed oral transit;Piecemeal swallowing  Oral - Thin Straw Delayed oral transit  Oral - Thin Syringe (None)  Oral - Puree (None)  Oral - Mechanical Soft (None)  Oral - Regular (None)  Oral - Multi-consistency (None)  Oral - Pill (None)  Oral Phase - Comment (None)      CHL IP PHARYNGEAL PHASE 12/11/2014  Pharyngeal Phase Impaired  Pharyngeal - Pudding Teaspoon (None)  Penetration/Aspiration details (pudding teaspoon) (None)  Pharyngeal - Pudding Cup (None)  Penetration/Aspiration details (pudding cup) (None)  Pharyngeal - Honey Teaspoon (None)  Penetration/Aspiration details (honey teaspoon) (None)  Pharyngeal - Honey Cup (None)  Penetration/Aspiration details (honey cup) (None)  Pharyngeal - Honey Syringe (None)  Penetration/Aspiration details (honey syringe) (None)  Pharyngeal - Nectar Teaspoon (None)  Penetration/Aspiration details (nectar teaspoon) (None)  Pharyngeal - Nectar Cup (None)  Penetration/Aspiration details (nectar cup) (None)  Pharyngeal - Nectar Straw (None)  Penetration/Aspiration details (nectar straw) (None)  Pharyngeal - Nectar Syringe (None)  Penetration/Aspiration details (nectar syringe) (None)  Pharyngeal - Ice Chips (None)  Penetration/Aspiration details (ice chips) (None)  Pharyngeal - Thin Teaspoon (None)  Penetration/Aspiration details (thin teaspoon) (None)  Pharyngeal - Thin Cup Delayed swallow initiation;Premature spillage to valleculae;Premature spillage to pyriform sinuses;Pharyngeal residue - valleculae;Pharyngeal residue - pyriform sinuses;Reduced laryngeal elevation;Reduced tongue base retraction  Penetration/Aspiration details (thin cup) (None)  Pharyngeal - Thin Straw Delayed swallow initiation;Premature spillage to pyriform sinuses;Pharyngeal residue - valleculae;Pharyngeal residue - pyriform sinuses;Reduced tongue base retraction;Reduced laryngeal elevation  Penetration/Aspiration details (thin straw) (None)  Pharyngeal - Thin Syringe  (None)  Penetration/Aspiration details (thin syringe') (None)  Pharyngeal - Puree (None)  Penetration/Aspiration details (puree) (None)  Pharyngeal - Mechanical Soft (None)  Penetration/Aspiration details (mechanical soft) (None)  Pharyngeal - Regular WFL  Penetration/Aspiration details (regular) (None)  Pharyngeal - Multi-consistency (None)  Penetration/Aspiration details (multi-consistency) (None)  Pharyngeal - Pill Pharyngeal residue - valleculae  Penetration/Aspiration details (pill) (None)  Pharyngeal Comment (None)     CHL IP CERVICAL ESOPHAGEAL PHASE 12/11/2014  Cervical Esophageal Phase WFL  Pudding Teaspoon (None)  Pudding Cup (None)  Honey Teaspoon (None)  Honey Cup (None)  Honey Syringe (None)  Nectar Teaspoon (None)  Nectar Cup (None)  Nectar Straw (None)  Nectar Syringe (None)  Thin Teaspoon (None)  Thin Cup (None)  Thin Straw (None)  Thin Syringe (None)  Cervical Esophageal Comment (None)    CHL IP GO 12/11/2014  Functional Assessment Tool Used clinical skilled judgement  Functional Limitations Swallowing  Swallow Current Status (O9629) CJ  Swallow Goal Status (B2841) CJ  Swallow Discharge Status (L2440) CJ  Motor Speech Current Status (N0272) (None)  Motor Speech Goal Status (Z3664) (None)  Motor Speech Goal Status (Q0347) (None)  Spoken Language Comprehension Current Status (Q2595) (None)  Spoken Language Comprehension Goal Status (G3875) (None)  Spoken Language Comprehension Discharge Status (I4332) (None)  Spoken Language Expression Current Status (R5188) (None)  Spoken Language Expression Goal Status (C1660) (None)  Spoken Language Expression Discharge Status (Y3016) (None)  Attention Current Status (W1093) (None)  Attention Goal Status (A3557) (None)  Attention Discharge Status (D2202) (None)  Memory Current Status (R4270) (None)  Memory Goal Status (W2376) (None)  Memory Discharge Status (E8315) (None)  Voice  Current Status [phone]) (None)   Voice Goal Status [phone]) (None)  Voice Discharge Status [phone]) (None)  Other Speech-Language Pathology Functional Limitation [phone]) (None)  Other Speech-Language Pathology Functional Limitation Goal Status (S1423) (None)  Other Speech-Language Pathology Functional Limitation Discharge Status [phone]) (None)           Houston Siren 12/11/2014, 1:21 PM  Orbie Pyo Colvin Caroli.[employer] [phone]

## 2014-12-17 NOTE — Progress Notes (Signed)
Quick Note:  Labs are ok We were planning on biologic therapy - she was going to think about it. Is she ready to pursue or does she have ? For me about it and I will call her ______

## 2014-12-17 NOTE — Progress Notes (Signed)
Quick Note:  I spoke to her and suggested that Remicade may be most cost effective for her. She is going to check with her insurance company and call back. Told her that support programs usually not helpful for Medicare patients ______

## 2014-12-18 ENCOUNTER — Telehealth: Payer: Self-pay | Admitting: Internal Medicine

## 2014-12-18 NOTE — Telephone Encounter (Signed)
Crohn's small and large intestine please

## 2014-12-18 NOTE — Telephone Encounter (Signed)
Carolyn Campbell called back and said she spoke to her insurance company about the Remicade.  They just told her that you need to contact them with dx.  Do you want [place] to initialize a rx for this [name]?

## 2014-12-19 ENCOUNTER — Other Ambulatory Visit: Payer: Self-pay

## 2014-12-19 ENCOUNTER — Telehealth: Payer: Self-pay | Admitting: Internal Medicine

## 2014-12-19 ENCOUNTER — Telehealth: Payer: Self-pay

## 2014-12-19 DIAGNOSIS — K5080 Crohn's disease of both small and large intestine without complications: Secondary | ICD-10-CM

## 2014-12-19 NOTE — Telephone Encounter (Signed)
See other phone note from 12/19/14 for additional details.  She is scheduled for first Remicade for 12/26/14

## 2014-12-19 NOTE — Telephone Encounter (Signed)
-----   Message from Gatha Mayer, MD sent at 12/18/2014  5:24 PM EDT ----- Regarding: F/U after Remicade Forgot to say 5 mg/kg on her She will need an REV in 3 months after starting the Remicade

## 2014-12-19 NOTE — Telephone Encounter (Signed)
Patient is scheduled for her first Remicade infusion for Thursday 12/26/14 8:00 am at [facility] She is notified of the first infusion date and time.  She is aware that they will schedule her next 2 infusions with her when she is there.   She verbalized understanding to arrive at 7:30 in admitting to register.

## 2014-12-19 NOTE — Telephone Encounter (Signed)
Patient has questions about how much Remicade infusion will be and how much she has to pay.  I advised her that I do not know that information.  I did give her the number to short stay, I advised her that they will probably be able to direct her to the correct department

## 2014-12-20 ENCOUNTER — Telehealth: Payer: Self-pay | Admitting: Internal Medicine

## 2014-12-20 LAB — HM PAP SMEAR: HM Pap smear: NORMAL

## 2014-12-20 NOTE — Telephone Encounter (Signed)
I explained to the patient that the precert for the infusion and for the drug are still pending. She is advised unlikely to have an answer until Tues of next week.  She is advised that once we hear from the insurance company we will come up with a plan if not approved or too expensive.  She is aware that we will call once we hear from the insurance company

## 2014-12-20 NOTE — Telephone Encounter (Signed)
Prior Carolyn Campbell is already in process for Remicade. Pending review It is a covered drug on her formulary according to [place] Left message for patient to call back

## 2014-12-23 NOTE — Telephone Encounter (Signed)
Pt said she resch'd her appt at the hospital until 12-26-14

## 2014-12-23 NOTE — Telephone Encounter (Signed)
Remicade has been approved under her Medicare part B.   She is notified that she should keep her appt for Thursday at [facility].  She told me she cancelled it last week because she thought it would not be covered.  She said she will call back the infusion center at [facility] and reschedule.  She is asked to call me back if she is not able to reschedule.

## 2014-12-26 ENCOUNTER — Encounter (HOSPITAL_COMMUNITY)
Admission: RE | Admit: 2014-12-26 | Discharge: 2014-12-26 | Disposition: A | Discharge status: Home / Self Care | Payer: Commercial Managed Care - HMO | Source: Ambulatory Visit | Attending: Internal Medicine | Admitting: Internal Medicine

## 2014-12-26 ENCOUNTER — Encounter (HOSPITAL_COMMUNITY): Payer: Commercial Managed Care - HMO

## 2014-12-26 VITALS — BP 139/60 | HR 72 | Temp 98.0°F | Resp 20 | Ht 60.0 in | Wt 132.0 lb

## 2014-12-26 DIAGNOSIS — K5080 Crohn's disease of both small and large intestine without complications: Secondary | ICD-10-CM | POA: Diagnosis present

## 2014-12-26 DIAGNOSIS — Z79899 Other long term (current) drug therapy: Secondary | ICD-10-CM | POA: Insufficient documentation

## 2014-12-26 MED ORDER — SODIUM CHLORIDE 0.9 % IV SOLN
INTRAVENOUS | Status: DC
  Administered 2014-12-26: 09:00:00 via INTRAVENOUS

## 2014-12-26 MED ORDER — DIPHENHYDRAMINE HCL 25 MG PO CAPS
ORAL_CAPSULE | ORAL | Status: AC
  Filled 2014-12-26: qty 2

## 2014-12-26 MED ORDER — ACETAMINOPHEN 325 MG PO TABS
ORAL_TABLET | ORAL | Status: AC
  Filled 2014-12-26: qty 2

## 2014-12-26 MED ORDER — SODIUM CHLORIDE 0.9 % IV SOLN
5.0000 mg/kg | Freq: Once | INTRAVENOUS | Status: AC
  Administered 2014-12-26: 300 mg via INTRAVENOUS
  Filled 2014-12-26: qty 30

## 2014-12-26 MED ORDER — DIPHENHYDRAMINE HCL 25 MG PO CAPS
50.0000 mg | ORAL_CAPSULE | Freq: Every day | ORAL | Status: DC
  Administered 2014-12-26: 50 mg via ORAL

## 2014-12-26 MED ORDER — ACETAMINOPHEN 325 MG PO TABS
650.0000 mg | ORAL_TABLET | Freq: Every day | ORAL | Status: DC
  Administered 2014-12-26: 650 mg via ORAL

## 2014-12-26 NOTE — Discharge Instructions (Signed)
Infliximab injection °What is this medicine? °INFLIXIMAB (in FLIX i mab) is used to treat Crohn's disease and ulcerative colitis. It is also used to treat ankylosing spondylitis, psoriasis, and some forms of arthritis. °This medicine may be used for other purposes; ask your health care provider or pharmacist if you have questions. °COMMON BRAND NAME(S): Remicade °What should I tell my health care provider before I take this medicine? °They need to know if you have any of these conditions: °-diabetes °-exposure to tuberculosis °-heart failure °-hepatitis or liver disease °-immune system problems °-infection °-lung or breathing disease, like COPD °-multiple sclerosis °-current or past resident of [place] °-seizure disorder °-an unusual or allergic reaction to infliximab, mouse proteins, other medicines, foods, dyes, or preservatives °-pregnant or trying to get pregnant °-breast-feeding °How should I use this medicine? °This medicine is for injection into a vein. It is usually given by a health care professional in a hospital or clinic setting. °A special MedGuide will be given to you by the pharmacist with each prescription and refill. Be sure to read this information carefully each time. °Talk to your pediatrician regarding the use of this medicine in children. Special care may be needed. °Overdosage: If you think you have taken too much of this medicine contact a poison control center or emergency room at once. °NOTE: This medicine is only for you. Do not share this medicine with others. °What if I miss a dose? °It is important not to miss your dose. Call your doctor or health care professional if you are unable to keep an appointment. °What may interact with this medicine? °Do not take this medicine with any of the following medications: °-anakinra °-rilonacept °This medicine may also interact with the following medications: °-vaccines °This list may not describe all possible interactions.  Give your health care provider a list of all the medicines, herbs, non-prescription drugs, or dietary supplements you use. Also tell them if you smoke, drink alcohol, or use illegal drugs. Some items may interact with your medicine. °What should I watch for while using this medicine? °Visit your doctor or health care professional for regular checks on your progress. °If you get a cold or other infection while receiving this medicine, call your doctor or health care professional. Do not treat yourself. This medicine may decrease your body's ability to fight infections. Before beginning therapy, your doctor may do a test to see if you have been exposed to tuberculosis. °This medicine may make the symptoms of heart failure worse in some patients. If you notice symptoms such as increased shortness of breath or swelling of the ankles or legs, contact your health care provider right away. °If you are going to have surgery or dental work, tell your health care professional or dentist that you have received this medicine. °If you take this medicine for plaque psoriasis, stay out of the sun. If you cannot avoid being in the sun, wear protective clothing and use sunscreen. Do not use sun lamps or tanning beds/booths. °What side effects may I notice from receiving this medicine? °Side effects that you should report to your doctor or health care professional as soon as possible: °-allergic reactions like skin rash, itching or hives, swelling of the face, lips, or tongue °-chest pain °-fever or chills, usually related to the infusion °-muscle or joint pain °-red, scaly patches or raised bumps on the skin °-signs of infection - fever or chills, cough, sore throat, pain or difficulty passing urine °-swollen lymph nodes   in the neck, underarm, or groin areas °-unexplained weight loss °-unusual bleeding or bruising °-unusually weak or tired °-yellowing of the eyes or skin °Side effects that usually do not require medical attention  (report to your doctor or health care professional if they continue or are bothersome): °-headache °-heartburn or stomach pain °-nausea, vomiting °This list may not describe all possible side effects. Call your doctor for medical advice about side effects. You may report side effects to FDA at 1-800-FDA-1088. °Where should I keep my medicine? °This drug is given in a hospital or clinic and will not be stored at home. °NOTE: This sheet is a summary. It may not cover all possible information. If you have questions about this medicine, talk to your doctor, pharmacist, or health care provider. °© 2015, Elsevier/Gold Standard. (2008-04-24 10:26:02) ° °

## 2015-01-03 ENCOUNTER — Other Ambulatory Visit: Payer: Self-pay | Admitting: Family Medicine

## 2015-01-03 ENCOUNTER — Telehealth: Payer: Self-pay | Admitting: Internal Medicine

## 2015-01-03 DIAGNOSIS — Z1239 Encounter for other screening for malignant neoplasm of breast: Secondary | ICD-10-CM

## 2015-01-03 NOTE — Telephone Encounter (Signed)
Patient has started on Remicade and had her first infusion 2 weeks ago.  She is advised that she should remain on both the Remicade and Pentasa.  She will call back for any additional questions or concerns.

## 2015-01-07 LAB — BASIC METABOLIC PANEL
BUN: 10 mg/dL (ref 4–21)
Creatinine: 0.7 mg/dL (ref 0.5–1.1)
Glucose: 101 mg/dL
Potassium: 4.2 mmol/L (ref 3.4–5.3)
Sodium: 146 mmol/L (ref 137–147)

## 2015-01-07 LAB — LIPID PANEL
Cholesterol: 223 mg/dL — AB (ref 0–200)
HDL: 135 mg/dL — AB (ref 35–70)
LDL Cholesterol: 77 mg/dL
LDl/HDL Ratio: 0.6
Triglycerides: 53 mg/dL (ref 40–160)

## 2015-01-07 LAB — CBC AND DIFFERENTIAL
HCT: 37 % (ref 36–46)
Hemoglobin: 12.6 g/dL (ref 12.0–16.0)
Neutrophils Absolute: 2 /uL
Platelets: 211 10*3/uL (ref 150–399)
WBC: 4.9 10^3/mL

## 2015-01-07 LAB — HEPATIC FUNCTION PANEL
ALT: 14 U/L (ref 7–35)
AST: 16 U/L (ref 13–35)
Alkaline Phosphatase: 89 U/L (ref 25–125)
Bilirubin, Total: 0.5 mg/dL

## 2015-01-07 LAB — TSH: TSH: 1.39 u[IU]/mL (ref 0.41–5.90)

## 2015-01-09 ENCOUNTER — Encounter (HOSPITAL_COMMUNITY)
Admission: RE | Admit: 2015-01-09 | Discharge: 2015-01-09 | Disposition: A | Discharge status: Home / Self Care | Payer: Commercial Managed Care - HMO | Source: Ambulatory Visit | Attending: Internal Medicine | Admitting: Internal Medicine

## 2015-01-09 VITALS — BP 111/44 | HR 81 | Temp 98.1°F | Resp 20 | Ht 60.0 in | Wt 132.0 lb

## 2015-01-09 DIAGNOSIS — K5080 Crohn's disease of both small and large intestine without complications: Secondary | ICD-10-CM

## 2015-01-09 DIAGNOSIS — Z79899 Other long term (current) drug therapy: Secondary | ICD-10-CM | POA: Diagnosis not present

## 2015-01-09 MED ORDER — DIPHENHYDRAMINE HCL 25 MG PO CAPS: 50.0000 mg | ORAL_CAPSULE | Freq: Every day | ORAL | Status: DC

## 2015-01-09 MED ORDER — SODIUM CHLORIDE 0.9 % IV SOLN
INTRAVENOUS | Status: DC
  Administered 2015-01-09: 10:00:00 via INTRAVENOUS

## 2015-01-09 MED ORDER — ACETAMINOPHEN 325 MG PO TABS: 650.0000 mg | ORAL_TABLET | Freq: Every day | ORAL | Status: DC

## 2015-01-09 MED ORDER — SODIUM CHLORIDE 0.9 % IV SOLN
5.0000 mg/kg | Freq: Once | INTRAVENOUS | Status: AC
  Administered 2015-01-09: 300 mg via INTRAVENOUS
  Filled 2015-01-09: qty 30

## 2015-01-10 NOTE — Consult Note (Signed)
PATIENT NAME:  Carolyn Campbell, Carolyn Campbell MR#:  [id number] DATE OF BIRTH:  12-Jul-1956  DATE OF CONSULTATION:  10/06/2012  REFERRING PHYSICIAN:  Hospitalist and Emergency Room physician from [facility].  CONSULTING PHYSICIAN:  Dionisio David, MD  HISTORY OF PRESENT ILLNESS: This is a 59 year old African American female with a past medical history of hypertension, hyperlipidemia, who normally sees Dr. Rutherford Nail, came into the Emergency Room because of chest pain. The chest pain was left precordial associated with shortness of breath and diaphoresis. She says she came into the Emergency Room, it was 9/10 chest pain. She was given nitroglycerin and aspirin and it eased off for chest pain. She had two sets of cardiac enzymes which were negative. EKG shows normal sinus rhythm, nonspecific ST-T changes. No acute changes. I was asked to evaluate the patient since two sets of cardiac enzymes were negative. The patient at this time is feeling much better, was sleeping when I came to see her.   PAST MEDICAL HISTORY: History of hypertension, hyperlipidemia, no history of diabetes.   SOCIAL HISTORY: No history of EtOH abuse or smoking or drug use.   FAMILY HISTORY: Her mother died of myocardial infarction at age 20.   ALLERGIES: No known drug allergies.   PHYSICAL EXAMINATION:  GENERAL: She is alert, oriented x 3, in no acute distress.  VITAL SIGNS: Stable.  NECK: No JVD.  LUNGS: Clear.  HEART: Regular rate and rhythm. Normal S1, S2. No audible murmur.  ABDOMEN: Soft, nontender, positive bowel sounds.  EXTREMITIES: No pedal edema.  CHEST: Has tenderness on the left precordium.  EKG showed normal sinus rhythm, nonspecific ST-T changes, 80 beats per minute, cardiac enzymes x 2 are negative.   ASSESSMENT AND PLAN: Exertional anginal-type chest pain. Right now chest pain free. I advised giving aspirin and Imdur 30 mg once a day with follow-up in the office Monday at 9:00 when we will do a stress  Myoview. The patient was given my card and my phone number in case she has chest pain over the weekend.   ____________________________ Dionisio David, MD sak:jm D: 10/07/2012 09:00:55 ET T: 10/07/2012 09:56:28 ET JOB#: [id number]  cc: Dionisio David, MD, <Dictator> Ashok Norris, MD Dionisio David MD ELECTRONICALLY SIGNED 11/06/2012 9:04

## 2015-01-10 NOTE — H&P (Signed)
PATIENT NAME:  Carolyn Campbell, RIEDERER MR#:  [id number] DATE OF BIRTH:  1955/09/29  DATE OF ADMISSION:  10/25/2012  REFERRING PHYSICIAN:   Ashok Norris, MD    INDICATIONS: Angina, chest pain, abnormal functional study, poorly-controlled hypertension.   HISTORY OF PRESENT ILLNESS: The patient is a 59 year old African American female with a history of multiple medical problems and multiple risk factors including hypertension, cerebrovascular accident, hypercholesterolemia and a heart murmur who presented with recurrent chest pain and angina. The patient has been having recurrent midsternal chest discomfort, saw her PMD in the office, was set up for further evaluation; but the pain got worse, so she went to the Emergency Room. She was seen in the Emergency Room and then also ended up at [facility] Emergency Room and got admitted from 01/19 to 01/20. She ruled out for myocardial infarction, but with her current symptoms she was recommended to have a functional study, so she was referred to me for further evaluation.  During the admission, she was found to have an abnormal CT with a pulmonary nodule. She complained of midsternal chest pain, was placed on reflux medicine, placed on Imdur. Blood pressure was difficult to control, and she was on benazepril and amlodipine as well as Imdur; but her blood pressure remained difficult to control. She had midsternal chest pain and radiation to the arms, so she was referred for further evaluation and underwent a functional study with a Lexiscan. She had Post Lexiscan shortness of breath and stridor.  The Lexiscan showed a persistent anterior defect suggestive of coronary artery disease, less likely related to a scar, so she was referred for cardiac catheterization.    REVIEW OF SYSTEMS: No blackout spells, syncope. No nausea, vomiting. No fever, chills, or sweats.  No weight loss, no weight gain.  No hemoptysis, hematemesis. No bright red blood rectum.   PAST MEDICAL HISTORY:   Hypertension, CVA, hypercholesterolemia,    artery disease, depression, GERD, pulmonary nodule, murmur.   FAMILY HISTORY: Hypertension.   SOCIAL HISTORY: Married, children, disabled. She denies recent smoking or alcohol consumption.   PAST SURGICAL HISTORY: Breast cancer.   MEDICATIONS: Amlodipine 10 mg a day, benazepril 20 mg a day, Lipitor 40 a day, calcium 600 a day, levothyroxine 50 a day, Zoloft 100 a day, Imdur 30 mg a day, aspirin 81 mg a day, omeprazole 20 mg a day, alendronate 70 mg once a week.   ALLERGIES: None.   LABORATORY DATA: BUN 13, creatinine 0.67, potassium 3.4. H and H of 12.5 and 37, platelet count of 202.   PHYSICAL EXAMINATION:  VITAL SIGNS: Blood pressure 170/90, pulse 75, respiratory rate 18, afebrile. Pulse ox was 97. HEENT: Normocephalic, atraumatic. Pupils are equal and reactive to light.  NECK: Supple. No significant JVD, bruits or adenopathy.  LUNGS: Clear to auscultation and percussion. No significant wheeze, rhonchi, or rales.  HEART: Regular rate and rhythm. Positive S4. Systolic ejection murmur at the apex.  ABDOMEN: Benign. Positive bowel sounds. No rebound, guarding, or tenderness.  EXTREMITIES: Within normal limits. No cyanosis, clubbing, or edema.  NEUROLOGIC: Intact.  SKIN: Normal.   ASSESSMENT: 1. Angina, possible unstable angina. 2. Abnormal functional study.  3. Chest pain.  4. Hypertension.  5. History of cerebrovascular accident.  6. Depression.  7. Hypercholesterolemia.  8. Gastroesophageal reflux disease.  9. Murmur.   PLAN:  1. Because of the abnormal functional study and persistent symptoms of chest pain, recurrent even on Imdur and aspirin, I would proceed with cardiac catheterization to definitively evaluate  coronary artery disease with her abnormal functional study.   2. Poorly controlled hypertension: She is on benazepril and amlodipine. I will consider adding a beta blocker and/or increase in the dosage of benazepril and/or  amlodipine, continue Imdur as well. We will consider renal angiogram to rule out renal artery stenosis since her blood pressure is so difficult to control. 3. Cerebrovascular accident: She is on aspirin, consider Plavix if symptoms recur. Lipid management with Lipitor should be continued and follow up based on lipid studies.  4. Reflux: Treated with omeprazole at this point.  5. Murmur:  She had an echocardiogram which showed a functional murmur.  We will proceed with cardiac catheterization to definitively rule out any significant coronary artery disease with her baseline abnormal functional study.   ____________________________ Loran Senters Clayborn Bigness, MD ddc:cb D: 10/25/2012 15:05:06 ET T: 10/25/2012 18:17:45 ET JOB#: [id number]  cc: Dwayne D. Clayborn Bigness, MD, <Dictator> Yolonda Kida MD ELECTRONICALLY SIGNED 11/10/2012 16:18

## 2015-01-23 ENCOUNTER — Encounter (HOSPITAL_COMMUNITY)
Admission: RE | Admit: 2015-01-23 | Discharge: 2015-01-23 | Disposition: A | Discharge status: Home / Self Care | Payer: Commercial Managed Care - HMO | Source: Ambulatory Visit | Attending: Internal Medicine | Admitting: Internal Medicine

## 2015-01-23 VITALS — BP 112/53 | HR 72 | Temp 98.2°F | Resp 20 | Ht 60.0 in | Wt 132.0 lb

## 2015-01-23 DIAGNOSIS — K5080 Crohn's disease of both small and large intestine without complications: Secondary | ICD-10-CM | POA: Diagnosis not present

## 2015-01-23 DIAGNOSIS — Z79899 Other long term (current) drug therapy: Secondary | ICD-10-CM | POA: Insufficient documentation

## 2015-01-23 MED ORDER — SODIUM CHLORIDE 0.9 % IV SOLN
5.0000 mg/kg | Freq: Once | INTRAVENOUS | Status: AC
  Administered 2015-01-23: 300 mg via INTRAVENOUS
  Filled 2015-01-23: qty 30

## 2015-01-23 MED ORDER — SODIUM CHLORIDE 0.9 % IV SOLN
INTRAVENOUS | Status: DC
  Administered 2015-01-23: 11:00:00 via INTRAVENOUS

## 2015-01-23 MED ORDER — DIPHENHYDRAMINE HCL 25 MG PO CAPS: 50.0000 mg | ORAL_CAPSULE | Freq: Every day | ORAL | Status: DC

## 2015-01-23 MED ORDER — ACETAMINOPHEN 325 MG PO TABS: 650.0000 mg | ORAL_TABLET | Freq: Every day | ORAL | Status: DC

## 2015-01-23 NOTE — Progress Notes (Signed)
Spoke with Carolyn Campbell at Dr. Marla Roe office re: pr states she had tooth pulled 3 days ago; Carolyn Campbell spoke with Dr. Carlean Purl, stated to proceed with infusion

## 2015-03-12 ENCOUNTER — Telehealth: Payer: Self-pay

## 2015-03-12 ENCOUNTER — Encounter: Payer: Self-pay | Admitting: Internal Medicine

## 2015-03-12 DIAGNOSIS — K50119 Crohn's disease of large intestine with unspecified complications: Secondary | ICD-10-CM

## 2015-03-12 DIAGNOSIS — R109 Unspecified abdominal pain: Secondary | ICD-10-CM

## 2015-03-12 NOTE — Telephone Encounter (Signed)
Patient notified New orders placed She will come for labs and x-rays this week

## 2015-03-12 NOTE — Telephone Encounter (Signed)
-----   Message from Gatha Mayer, MD sent at 03/12/2015  3:36 PM EDT ----- Regarding: needs testing Has Crohn's and has had 0,2 and 6 week remicade.  Has had 2 weeks bloating and post defecation abdominal pain, still having rectal bleeding.  1) CBC, CMET, CRP re: abdominal pain and Crohn's 2) 2 view abdomen re these sxs and Crohn's   I told her we would call via My Chart

## 2015-03-13 ENCOUNTER — Ambulatory Visit (INDEPENDENT_AMBULATORY_CARE_PROVIDER_SITE_OTHER)
Admission: RE | Admit: 2015-03-13 | Discharge: 2015-03-13 | Disposition: A | Discharge status: Home / Self Care | Payer: Commercial Managed Care - HMO | Source: Ambulatory Visit | Attending: Internal Medicine | Admitting: Internal Medicine

## 2015-03-13 ENCOUNTER — Ambulatory Visit: Payer: Self-pay | Admitting: Family Medicine

## 2015-03-13 ENCOUNTER — Other Ambulatory Visit (INDEPENDENT_AMBULATORY_CARE_PROVIDER_SITE_OTHER): Payer: Commercial Managed Care - HMO

## 2015-03-13 ENCOUNTER — Other Ambulatory Visit: Payer: Self-pay

## 2015-03-13 DIAGNOSIS — K50119 Crohn's disease of large intestine with unspecified complications: Secondary | ICD-10-CM

## 2015-03-13 DIAGNOSIS — R109 Unspecified abdominal pain: Secondary | ICD-10-CM

## 2015-03-13 LAB — CBC WITH DIFFERENTIAL/PLATELET
Basophils Absolute: 0 10*3/uL (ref 0.0–0.1)
Basophils Relative: 0.7 % (ref 0.0–3.0)
Eosinophils Absolute: 0.1 10*3/uL (ref 0.0–0.7)
Eosinophils Relative: 2.7 % (ref 0.0–5.0)
HCT: 39.7 % (ref 36.0–46.0)
Hemoglobin: 13.1 g/dL (ref 12.0–15.0)
Lymphocytes Relative: 35.5 % (ref 12.0–46.0)
Lymphs Abs: 1.9 10*3/uL (ref 0.7–4.0)
MCHC: 33.1 g/dL (ref 30.0–36.0)
MCV: 86.7 fl (ref 78.0–100.0)
Monocytes Absolute: 0.6 10*3/uL (ref 0.1–1.0)
Monocytes Relative: 10.5 % (ref 3.0–12.0)
Neutro Abs: 2.8 10*3/uL (ref 1.4–7.7)
Neutrophils Relative %: 50.6 % (ref 43.0–77.0)
Platelets: 218 10*3/uL (ref 150.0–400.0)
RBC: 4.58 Mil/uL (ref 3.87–5.11)
RDW: 14.6 % (ref 11.5–15.5)
WBC: 5.5 10*3/uL (ref 4.0–10.5)

## 2015-03-13 LAB — COMPREHENSIVE METABOLIC PANEL
ALT: 22 U/L (ref 0–35)
AST: 21 U/L (ref 0–37)
Albumin: 4.2 g/dL (ref 3.5–5.2)
Alkaline Phosphatase: 86 U/L (ref 39–117)
BUN: 10 mg/dL (ref 6–23)
CO2: 29 mEq/L (ref 19–32)
Calcium: 9.9 mg/dL (ref 8.4–10.5)
Chloride: 105 mEq/L (ref 96–112)
Creatinine, Ser: 0.77 mg/dL (ref 0.40–1.20)
GFR: 97.91 mL/min (ref >60.00)
Glucose, Bld: 90 mg/dL (ref 70–99)
Potassium: 3.4 mEq/L — ABNORMAL LOW (ref 3.5–5.1)
Sodium: 141 mEq/L (ref 135–145)
Total Bilirubin: 0.6 mg/dL (ref 0.2–1.2)
Total Protein: 7.4 g/dL (ref 6.0–8.3)

## 2015-03-13 LAB — C-REACTIVE PROTEIN: CRP: 0.4 mg/dL — ABNORMAL LOW (ref 0.5–20.0)

## 2015-03-13 MED ORDER — POTASSIUM CHLORIDE ER 20 MEQ PO TBCR: 20.0000 meq | EXTENDED_RELEASE_TABLET | Freq: Every day | ORAL | Status: DC

## 2015-03-13 MED ORDER — DICYCLOMINE HCL 20 MG PO TABS: 20.0000 mg | ORAL_TABLET | Freq: Three times a day (TID) | ORAL | Status: DC

## 2015-03-13 NOTE — Progress Notes (Signed)
Quick Note:  Labs and xray ok except slightly low K  KCL 20 mEQ daily # 30 12 RF Dicyclomine 20 mg q6 hrs prn pain tell her may help to take before meals  #90 2 RF  If this does not help things by next week call back or sooner if getting severe problems ______

## 2015-03-20 ENCOUNTER — Encounter (HOSPITAL_COMMUNITY)
Admission: RE | Admit: 2015-03-20 | Discharge: 2015-03-20 | Disposition: A | Discharge status: Home / Self Care | Payer: Commercial Managed Care - HMO | Source: Ambulatory Visit | Attending: Internal Medicine | Admitting: Internal Medicine

## 2015-03-20 VITALS — BP 107/41 | HR 72 | Temp 98.3°F | Resp 20 | Ht 60.0 in | Wt 130.0 lb

## 2015-03-20 DIAGNOSIS — K5080 Crohn's disease of both small and large intestine without complications: Secondary | ICD-10-CM | POA: Diagnosis not present

## 2015-03-20 MED ORDER — SODIUM CHLORIDE 0.9 % IV SOLN
INTRAVENOUS | Status: DC
  Administered 2015-03-20: 10:00:00 via INTRAVENOUS

## 2015-03-20 MED ORDER — ACETAMINOPHEN 325 MG PO TABS
650.0000 mg | ORAL_TABLET | Freq: Every day | ORAL | Status: DC
Start: 2015-03-20 — End: 2015-03-21

## 2015-03-20 MED ORDER — DIPHENHYDRAMINE HCL 25 MG PO CAPS
50.0000 mg | ORAL_CAPSULE | Freq: Every day | ORAL | Status: DC
Start: 2015-03-20 — End: 2015-03-21

## 2015-03-20 MED ORDER — SODIUM CHLORIDE 0.9 % IV SOLN
5.0000 mg/kg | Freq: Once | INTRAVENOUS | Status: AC
  Administered 2015-03-20: 300 mg via INTRAVENOUS
  Filled 2015-03-20: qty 30

## 2015-04-10 ENCOUNTER — Ambulatory Visit (INDEPENDENT_AMBULATORY_CARE_PROVIDER_SITE_OTHER): Payer: Commercial Managed Care - HMO | Admitting: Internal Medicine

## 2015-04-10 ENCOUNTER — Encounter: Payer: Self-pay | Admitting: Internal Medicine

## 2015-04-10 VITALS — BP 116/62 | HR 72 | Ht 60.0 in | Wt 136.6 lb

## 2015-04-10 DIAGNOSIS — K50811 Crohn's disease of both small and large intestine with rectal bleeding: Secondary | ICD-10-CM

## 2015-04-10 NOTE — Patient Instructions (Signed)
  Stop your Pentasa per Dr Carlean Purl.   We are going to look into you getting your remicade in [place].   Follow up with Dr. Carlean Purl in 3 months.    I appreciate the opportunity to care for you. Silvano Rusk, MD, [facility]

## 2015-04-10 NOTE — Assessment & Plan Note (Addendum)
She says she is slightly better, she has had 4 doses of Remicade. She is concerned about the cost of this and traveling over to [place]. He would like to return to [facility] to get the therapy. I explained to her that I do not have privileges at [facility] and that I might have to ask her primary care provider to prescribe it. She asked about returning to her gastrologist she saw first, Dr. Allen Norris. I told her I would see if that was possible for her. I had further to go back to [place] with second opinion initially but she decided to stay here. He has been helped by dicyclomine and will continue that.  At this point I recommended she discontinue Pentasa, continue the Remicade. 5 mg/kg every 8 weeks. I think later this she thereafter 6+ months of Remicade it would make sense to check for improvement by colonoscopy. At this point I plan to see her back in about 3 months and let she switches air.

## 2015-04-10 NOTE — Progress Notes (Signed)
   Subjective:    Patient ID: Carolyn Campbell, female    DOB: 1955-10-12, 59 y.o.   MRN: [id number] Chief complaint:  Crohn's follow-up HPI Patient returns, she has received her loading doses and then her first every 8 week dose of Remicade 5 mg/kg. She had called in June and said she was having postprandial bloating slight prescribed dicyclomine 20 mg and she says that helps quite a bit. She uses it before meals as needed. She is not having diarrhea but she still has some mucus production and rectal bleeding. She thinks that is slightly better since initiating Remicade. She is concerned about the infusion administration cost with Remicade as well as she is working with an assistance fund to try to get help with that. Wt Readings from Last 3 Encounters:  04/10/15 136 lb 9.6 oz (61.961 kg)  03/20/15 130 lb (58.968 kg)  12/20/14 132 lb 3 oz (59.96 kg)   she denies any infectious symptoms though she's having some ear pressure and alone with sinus congestion.  Medications, allergies, past medical history, past surgical history, family history and social history are reviewed and updated in the EMR.   Review of Systems As above    Objective:   Physical Exam BP 116/62 mmHg  Pulse 72  Ht 5' (1.524 m)  Wt 136 lb 9.6 oz (61.961 kg)  BMI 26.68 kg/m2 Well-developed well-nourished black woman in no acute distress She does not have any sinus tenderness, pharynx is clear the neck is supple without lymphadenopathy Lungs are clear Heart sounds are normal The abdomen is soft and nontender and bowel sounds are present and there is no organomegaly or mass      Assessment & Plan:  Crohn's disease of both small and large intestine with rectal bleeding She says she is slightly better, she has had 4 doses of Remicade. She is concerned about the cost of this and traveling over to [place]. He would like to return to [facility] to get the therapy. I explained to her that I do not have privileges at [facility] and that I might have to ask her primary care provider to prescribe it. She asked about returning to her gastrologist she saw first, Dr. Allen Norris. I told her I would see if that was possible for her. I had further to go back to [place] with second opinion initially but she decided to stay here. He has been helped by dicyclomine and will continue that.  At this point I recommended she discontinue Pentasa, continue the Remicade. 5 mg/kg every 8 weeks. I think later this she thereafter 6+ months of Remicade it would make sense to check for improvement by colonoscopy. At this point I plan to see her back in about 3 months and let she switches air.   15 minutes time spent with patient > half in counseling coordination of care  CC: Ashok Norris, MD

## 2015-04-14 ENCOUNTER — Telehealth: Payer: Self-pay

## 2015-04-14 NOTE — Telephone Encounter (Signed)
-----   Message from Gatha Mayer, MD sent at 04/14/2015  9:24 AM EDT ----- Regarding: FW: accept a patient back Please let the patient know Dr. Allen Norris is willing to see her again.  She should transfer back to him.  She will need help getting Remicade set up at [facility].  Gatha Mayer, MD, [facility]  ----- Message -----    From: Lucilla Lame, MD    Sent: 04/12/2015   7:00 PM      To: Gatha Mayer, MD Subject: RE: accept a patient back                      That is fine. I don't mind when someone wants another opinion. I will help her in anyway I can.  Darren ----- Message -----    From: Gatha Mayer, MD    Sent: 04/10/2015   1:04 PM      To: Lucilla Lame, MD Subject: accept a patient back                          Hello Dr. Linton Flemings sent me this lady for another opinion and I recommended Remicade - she elected to stay with me but it has been a burden to come over here for Remicade (I cannot order at [facility]) and is asking to return to you and [place].  I can understand if you do not want to accept her back since she sought another opinion but told her I would ask.  She is perhaps a little better on Remicade though she thinks dicyclomine has helped a lot - ? If its Remicade working   Thanks

## 2015-04-14 NOTE — Telephone Encounter (Signed)
Patient notified, she will call Dr. Dorothey Baseman office to schedule an appt.

## 2015-05-13 ENCOUNTER — Encounter: Payer: Self-pay | Admitting: Family Medicine

## 2015-05-13 ENCOUNTER — Encounter: Payer: Self-pay | Admitting: Emergency Medicine

## 2015-05-13 ENCOUNTER — Emergency Department
Admission: EM | Admit: 2015-05-13 | Discharge: 2015-05-13 | Disposition: A | Discharge status: Home / Self Care | Payer: Commercial Managed Care - HMO | Attending: Emergency Medicine | Admitting: Emergency Medicine

## 2015-05-13 DIAGNOSIS — M25552 Pain in left hip: Secondary | ICD-10-CM | POA: Diagnosis present

## 2015-05-13 DIAGNOSIS — M5432 Sciatica, left side: Secondary | ICD-10-CM

## 2015-05-13 DIAGNOSIS — I10 Essential (primary) hypertension: Secondary | ICD-10-CM | POA: Diagnosis not present

## 2015-05-13 DIAGNOSIS — Z79899 Other long term (current) drug therapy: Secondary | ICD-10-CM | POA: Diagnosis not present

## 2015-05-13 MED ORDER — CYCLOBENZAPRINE HCL 5 MG PO TABS
5.0000 mg | ORAL_TABLET | Freq: Three times a day (TID) | ORAL | Status: DC | PRN
Start: 2015-05-13 — End: 2016-02-09

## 2015-05-13 MED ORDER — CYCLOBENZAPRINE HCL 10 MG PO TABS
5.0000 mg | ORAL_TABLET | Freq: Once | ORAL | Status: AC
Start: 2015-05-13 — End: 2015-05-13
  Administered 2015-05-13: 5 mg via ORAL
  Filled 2015-05-13: qty 1

## 2015-05-13 NOTE — ED Notes (Signed)
Lower back pain for a few days w/o injury  Now pain radiates into leg  ambs slowly d/t pain  No limp

## 2015-05-13 NOTE — ED Provider Notes (Signed)
[facility] Emergency Department Provider Note    ____________________________________________  Time seen: 1105  I have reviewed the triage vital signs and the nursing notes.   HISTORY  Chief Complaint Hip Pain   History limited by: Not Limited   HPI Carolyn Campbell is a 59 y.o. female who presents to the emergency department today with left sided low back pain and left leg pain. Patient states that these symptoms started 2 days ago. They've progressively gotten worse since then. She denies any trauma, falls prior to the pain starting. She describes pain as starting in the left lower back.She states that the pain then travels down the back side of her left thigh. He gets to the level of the knee. She describes it as sharp. She denies any recent fevers. Denies any bowel or bladder incontinence or retention.     Past Medical History  Diagnosis Date  . Asthma   . Hypertension   . Hyperlipidemia   . Allergic rhinitis   . Breast cancer   . Esophageal reflux   . Hypothyroidism   . Osteoporosis   . Occlusion, cerebral artery     NOS w/infarction  . 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 Cognitive deficits as late effect of cerebrovascular disease   . Glaucoma     vitreous degeneration  . Crohn's disease of both small and large intestine with rectal bleeding 12/04/2014    Patient Active Problem List   Diagnosis Date Noted  . Crohn's disease of both small and large intestine with rectal bleeding 12/04/2014  . Adrenal adenoma 10/19/2013  . Shortness of breath 11/07/2012  . Solitary pulmonary nodule 11/07/2012    Past Surgical History  Procedure Laterality Date  . Tubal ligation    . Breast surgery Left     malignant biopsy  . Esophagogastroduodenoscopy  2015  . Colonoscopy  2015  . Givens capsule study  2015  . Finger surgery      Right small finger    Current Outpatient Rx  Name  Route  Sig  Dispense  Refill  .  alendronate (FOSAMAX) 70 MG tablet               . amLODipine (NORVASC) 10 MG tablet   Oral   Take 1 tablet by mouth daily.         Marland Kitchen atorvastatin (LIPITOR) 40 MG tablet   Oral   Take 1 tablet by mouth daily.         . benazepril (LOTENSIN) 20 MG tablet   Oral   Take 1 tablet by mouth daily.         Marland Kitchen dicyclomine (BENTYL) 20 MG tablet   Oral   Take 1 tablet (20 mg total) by mouth 3 (three) times daily before meals.   90 tablet   1   . inFLIXimab in sodium chloride 0.9 %   Intravenous   Inject 5 mg/kg into the vein every 8 (eight) weeks.         Marland Kitchen levothyroxine (SYNTHROID, LEVOTHROID) 50 MCG tablet   Oral   Take 1 tablet by mouth daily.         Marland Kitchen omeprazole (PRILOSEC) 20 MG capsule               . Potassium Chloride ER 20 MEQ TBCR   Oral   Take 20 mEq by mouth daily.   30 tablet   12   . sertraline (ZOLOFT) 100 MG  tablet   Oral   Take 100 mg by mouth daily.           Allergies Morphine and related  Family History  Problem Relation Age of Onset  . Heart disease Mother   . Breast cancer Sister   . Alcohol abuse Father   . Cerebrovascular Accident Father   . Hypertension    . Diabetes    . Heart attack Mother     Social History Social History  Substance Use Topics  . Smoking status: Never Smoker   . Smokeless tobacco: Never Used  . Alcohol Use: No    Review of Systems  Constitutional: Negative for fever. Cardiovascular: Negative for chest pain. Respiratory: Negative for shortness of breath. Gastrointestinal: Negative for abdominal pain, vomiting and diarrhea. Genitourinary: Negative for dysuria. Musculoskeletal: Left lower back pain Skin: Negative for rash. Neurological: Negative for headaches, focal weakness or numbness.  10-point ROS otherwise negative.  ____________________________________________   PHYSICAL EXAM:  VITAL SIGNS: ED Triage Vitals  Enc Vitals Group     BP 05/13/15 1040 131/55 mmHg     Pulse Rate  05/13/15 1040 88     Resp 05/13/15 1040 16     Temp 05/13/15 1040 98 F (36.7 C)     Temp src --      SpO2 05/13/15 1040 97 %     Weight 05/13/15 1040 138 lb (62.596 kg)     Height 05/13/15 1040 5' (1.524 m)     Head Cir --      Peak Flow --      Pain Score 05/13/15 1041 10   Constitutional: Alert and oriented. Well appearing and in no distress. Eyes: Conjunctivae are normal. PERRL. Normal extraocular movements. ENT   Head: Normocephalic and atraumatic.   Nose: No congestion/rhinnorhea.   Mouth/Throat: Mucous membranes are moist.   Neck: No stridor. Hematological/Lymphatic/Immunilogical: No cervical lymphadenopathy. Cardiovascular: Normal rate, regular rhythm.  No murmurs, rubs, or gallops. Respiratory: Normal respiratory effort without tachypnea nor retractions. Breath sounds are clear and equal bilaterally. No wheezes/rales/rhonchi. Gastrointestinal: Soft and nontender. No distention.  Genitourinary: Deferred Musculoskeletal: Normal range of motion in all extremities. No joint effusions.  No lower extremity tenderness nor edema. Mild tenderness to palpation of the left lower back. No midline tenderness of the entire spine. Neurologic:  Normal speech and language. No gross focal neurologic deficits are appreciated. Speech is normal.  Skin:  Skin is warm, dry and intact. No rash noted. Psychiatric: Mood and affect are normal. Speech and behavior are normal. Patient exhibits appropriate insight and judgment.  ____________________________________________    LABS (pertinent positives/negatives)  None  ____________________________________________   EKG  None  ____________________________________________    RADIOLOGY  None  ____________________________________________   PROCEDURES  Procedure(s) performed: None  Critical Care performed: No  ____________________________________________   INITIAL IMPRESSION / ASSESSMENT AND PLAN / ED  COURSE  Pertinent labs & imaging results that were available during my care of the patient were reviewed by me and considered in my medical decision making (see chart for details).  Patient presents to the emergency department today with concerns for left lower back pain that radiates down the back of her left thigh. Clinical history and exam is consistent with sciatica. This time I have no concern for cord compression. Discussed plan and expectations with patient.  ____________________________________________   FINAL CLINICAL IMPRESSION(S) / ED DIAGNOSES  Final diagnoses:  Sciatica, left     Nance Pear, MD 05/13/15 1116

## 2015-05-13 NOTE — Discharge Instructions (Signed)
Please seek medical attention for any high fevers, chest pain, shortness of breath, change in behavior, persistent vomiting, bloody stool or any other new or concerning symptoms.   Sciatica with Rehab The sciatic nerve runs from the back down the leg and is responsible for sensation and control of the muscles in the back (posterior) side of the thigh, lower leg, and foot. Sciatica is a condition that is characterized by inflammation of this nerve.  SYMPTOMS   Signs of nerve damage, including numbness and/or weakness along the posterior side of the lower extremity.  Pain in the back of the thigh that may also travel down the leg.  Pain that worsens when sitting for long periods of time.  Occasionally, pain in the back or buttock. CAUSES  Inflammation of the sciatic nerve is the cause of sciatica. The inflammation is due to something irritating the nerve. Common sources of irritation include:  Sitting for long periods of time.  Direct trauma to the nerve.  Arthritis of the spine.  Herniated or ruptured disk.  Slipping of the vertebrae (spondylolisthesis).  Pressure from soft tissues, such as muscles or ligament-like tissue (fascia). RISK INCREASES WITH:  Sports that place pressure or stress on the spine (football or weightlifting).  Poor strength and flexibility.  Failure to warm up properly before activity.  Family history of low back pain or disk disorders.  Previous back injury or surgery.  Poor body mechanics, especially when lifting, or poor posture. PREVENTION   Warm up and stretch properly before activity.  Maintain physical fitness:  Strength, flexibility, and endurance.  Cardiovascular fitness.  Learn and use proper technique, especially with posture and lifting. When possible, have coach correct improper technique.  Avoid activities that place stress on the spine. PROGNOSIS If treated properly, then sciatica usually resolves within 6 weeks. However,  occasionally surgery is necessary.  RELATED COMPLICATIONS   Permanent nerve damage, including pain, numbness, tingle, or weakness.  Chronic back pain.  Risks of surgery: infection, bleeding, nerve damage, or damage to surrounding tissues. TREATMENT Treatment initially involves resting from any activities that aggravate your symptoms. The use of ice and medication may help reduce pain and inflammation. The use of strengthening and stretching exercises may help reduce pain with activity. These exercises may be performed at home or with referral to a therapist. A therapist may recommend further treatments, such as transcutaneous electronic nerve stimulation (TENS) or ultrasound. Your caregiver may recommend corticosteroid injections to help reduce inflammation of the sciatic nerve. If symptoms persist despite non-surgical (conservative) treatment, then surgery may be recommended. MEDICATION  If pain medication is necessary, then nonsteroidal anti-inflammatory medications, such as aspirin and ibuprofen, or other minor pain relievers, such as acetaminophen, are often recommended.  Do not take pain medication for 7 days before surgery.  Prescription pain relievers may be given if deemed necessary by your caregiver. Use only as directed and only as much as you need.  Ointments applied to the skin may be helpful.  Corticosteroid injections may be given by your caregiver. These injections should be reserved for the most serious cases, because they may only be given a certain number of times. HEAT AND COLD  Cold treatment (icing) relieves pain and reduces inflammation. Cold treatment should be applied for 10 to 15 minutes every 2 to 3 hours for inflammation and pain and immediately after any activity that aggravates your symptoms. Use ice packs or massage the area with a piece of ice (ice massage).  Heat treatment may be  used prior to performing the stretching and strengthening activities prescribed  by your caregiver, physical therapist, or athletic trainer. Use a heat pack or soak the injury in warm water. SEEK MEDICAL CARE IF:  Treatment seems to offer no benefit, or the condition worsens.  Any medications produce adverse side effects. EXERCISES  RANGE OF MOTION (ROM) AND STRETCHING EXERCISES - Sciatica Most people with sciatic will find that their symptoms worsen with either excessive bending forward (flexion) or arching at the low back (extension). The exercises which will help resolve your symptoms will focus on the opposite motion. Your physician, physical therapist or athletic trainer will help you determine which exercises will be most helpful to resolve your low back pain. Do not complete any exercises without first consulting with your clinician. Discontinue any exercises which worsen your symptoms until you speak to your clinician. If you have pain, numbness or tingling which travels down into your buttocks, leg or foot, the goal of the therapy is for these symptoms to move closer to your back and eventually resolve. Occasionally, these leg symptoms will get better, but your low back pain may worsen; this is typically an indication of progress in your rehabilitation. Be certain to be very alert to any changes in your symptoms and the activities in which you participated in the 24 hours prior to the change. Sharing this information with your clinician will allow him/her to most efficiently treat your condition. These exercises may help you when beginning to rehabilitate your injury. Your symptoms may resolve with or without further involvement from your physician, physical therapist or athletic trainer. While completing these exercises, remember:   Restoring tissue flexibility helps normal motion to return to the joints. This allows healthier, less painful movement and activity.  An effective stretch should be held for at least 30 seconds.  A stretch should never be painful. You should  only feel a gentle lengthening or release in the stretched tissue. FLEXION RANGE OF MOTION AND STRETCHING EXERCISES: STRETCH - Flexion, Single Knee to Chest   Lie on a firm bed or floor with both legs extended in front of you.  Keeping one leg in contact with the floor, bring your opposite knee to your chest. Hold your leg in place by either grabbing behind your thigh or at your knee.  Pull until you feel a gentle stretch in your low back. Hold __________ seconds.  Slowly release your grasp and repeat the exercise with the opposite side. Repeat __________ times. Complete this exercise __________ times per day.  STRETCH - Flexion, Double Knee to Chest  Lie on a firm bed or floor with both legs extended in front of you.  Keeping one leg in contact with the floor, bring your opposite knee to your chest.  Tense your stomach muscles to support your back and then lift your other knee to your chest. Hold your legs in place by either grabbing behind your thighs or at your knees.  Pull both knees toward your chest until you feel a gentle stretch in your low back. Hold __________ seconds.  Tense your stomach muscles and slowly return one leg at a time to the floor. Repeat __________ times. Complete this exercise __________ times per day.  STRETCH - Low Trunk Rotation   Lie on a firm bed or floor. Keeping your legs in front of you, bend your knees so they are both pointed toward the ceiling and your feet are flat on the floor.  Extend your arms out to  the side. This will stabilize your upper body by keeping your shoulders in contact with the floor.  Gently and slowly drop both knees together to one side until you feel a gentle stretch in your low back. Hold for __________ seconds.  Tense your stomach muscles to support your low back as you bring your knees back to the starting position. Repeat the exercise to the other side. Repeat __________ times. Complete this exercise __________ times per  day  EXTENSION RANGE OF MOTION AND FLEXIBILITY EXERCISES: STRETCH - Extension, Prone on Elbows  Lie on your stomach on the floor, a bed will be too soft. Place your palms about shoulder width apart and at the height of your head.  Place your elbows under your shoulders. If this is too painful, stack pillows under your chest.  Allow your body to relax so that your hips drop lower and make contact more completely with the floor.  Hold this position for __________ seconds.  Slowly return to lying flat on the floor. Repeat __________ times. Complete this exercise __________ times per day.  RANGE OF MOTION - Extension, Prone Press Ups  Lie on your stomach on the floor, a bed will be too soft. Place your palms about shoulder width apart and at the height of your head.  Keeping your back as relaxed as possible, slowly straighten your elbows while keeping your hips on the floor. You may adjust the placement of your hands to maximize your comfort. As you gain motion, your hands will come more underneath your shoulders.  Hold this position __________ seconds.  Slowly return to lying flat on the floor. Repeat __________ times. Complete this exercise __________ times per day.  STRENGTHENING EXERCISES - Sciatica  These exercises may help you when beginning to rehabilitate your injury. These exercises should be done near your "sweet spot." This is the neutral, low-back arch, somewhere between fully rounded and fully arched, that is your least painful position. When performed in this safe range of motion, these exercises can be used for people who have either a flexion or extension based injury. These exercises may resolve your symptoms with or without further involvement from your physician, physical therapist or athletic trainer. While completing these exercises, remember:   Muscles can gain both the endurance and the strength needed for everyday activities through controlled exercises.  Complete  these exercises as instructed by your physician, physical therapist or athletic trainer. Progress with the resistance and repetition exercises only as your caregiver advises.  You may experience muscle soreness or fatigue, but the pain or discomfort you are trying to eliminate should never worsen during these exercises. If this pain does worsen, stop and make certain you are following the directions exactly. If the pain is still present after adjustments, discontinue the exercise until you can discuss the trouble with your clinician. STRENGTHENING - Deep Abdominals, Pelvic Tilt   Lie on a firm bed or floor. Keeping your legs in front of you, bend your knees so they are both pointed toward the ceiling and your feet are flat on the floor.  Tense your lower abdominal muscles to press your low back into the floor. This motion will rotate your pelvis so that your tail bone is scooping upwards rather than pointing at your feet or into the floor.  With a gentle tension and even breathing, hold this position for __________ seconds. Repeat __________ times. Complete this exercise __________ times per day.  STRENGTHENING - Abdominals, Crunches   Lie on a  firm bed or floor. Keeping your legs in front of you, bend your knees so they are both pointed toward the ceiling and your feet are flat on the floor. Cross your arms over your chest.  Slightly tip your chin down without bending your neck.  Tense your abdominals and slowly lift your trunk high enough to just clear your shoulder blades. Lifting higher can put excessive stress on the low back and does not further strengthen your abdominal muscles.  Control your return to the starting position. Repeat __________ times. Complete this exercise __________ times per day.  STRENGTHENING - Quadruped, Opposite UE/LE Lift  Assume a hands and knees position on a firm surface. Keep your hands under your shoulders and your knees under your hips. You may place padding  under your knees for comfort.  Find your neutral spine and gently tense your abdominal muscles so that you can maintain this position. Your shoulders and hips should form a rectangle that is parallel with the floor and is not twisted.  Keeping your trunk steady, lift your right hand no higher than your shoulder and then your left leg no higher than your hip. Make sure you are not holding your breath. Hold this position __________ seconds.  Continuing to keep your abdominal muscles tense and your back steady, slowly return to your starting position. Repeat with the opposite arm and leg. Repeat __________ times. Complete this exercise __________ times per day.  STRENGTHENING - Abdominals and Quadriceps, Straight Leg Raise   Lie on a firm bed or floor with both legs extended in front of you.  Keeping one leg in contact with the floor, bend the other knee so that your foot can rest flat on the floor.  Find your neutral spine, and tense your abdominal muscles to maintain your spinal position throughout the exercise.  Slowly lift your straight leg off the floor about 6 inches for a count of 15, making sure to not hold your breath.  Still keeping your neutral spine, slowly lower your leg all the way to the floor. Repeat this exercise with each leg __________ times. Complete this exercise __________ times per day. POSTURE AND BODY MECHANICS CONSIDERATIONS - Sciatica Keeping correct posture when sitting, standing or completing your activities will reduce the stress put on different body tissues, allowing injured tissues a chance to heal and limiting painful experiences. The following are general guidelines for improved posture. Your physician or physical therapist will provide you with any instructions specific to your needs. While reading these guidelines, remember:  The exercises prescribed by your provider will help you have the flexibility and strength to maintain correct postures.  The correct  posture provides the optimal environment for your joints to work. All of your joints have less wear and tear when properly supported by a spine with good posture. This means you will experience a healthier, less painful body.  Correct posture must be practiced with all of your activities, especially prolonged sitting and standing. Correct posture is as important when doing repetitive low-stress activities (typing) as it is when doing a single heavy-load activity (lifting). RESTING POSITIONS Consider which positions are most painful for you when choosing a resting position. If you have pain with flexion-based activities (sitting, bending, stooping, squatting), choose a position that allows you to rest in a less flexed posture. You would want to avoid curling into a fetal position on your side. If your pain worsens with extension-based activities (prolonged standing, working overhead), avoid resting in an extended  position such as sleeping on your stomach. Most people will find more comfort when they rest with their spine in a more neutral position, neither too rounded nor too arched. Lying on a non-sagging bed on your side with a pillow between your knees, or on your back with a pillow under your knees will often provide some relief. Keep in mind, being in any one position for a prolonged period of time, no matter how correct your posture, can still lead to stiffness. PROPER SITTING POSTURE In order to minimize stress and discomfort on your spine, you must sit with correct posture Sitting with good posture should be effortless for a healthy body. Returning to good posture is a gradual process. Many people can work toward this most comfortably by using various supports until they have the flexibility and strength to maintain this posture on their own. When sitting with proper posture, your ears will fall over your shoulders and your shoulders will fall over your hips. You should use the back of the chair to  support your upper back. Your low back will be in a neutral position, just slightly arched. You may place a small pillow or folded towel at the base of your low back for support.  When working at a desk, create an environment that supports good, upright posture. Without extra support, muscles fatigue and lead to excessive strain on joints and other tissues. Keep these recommendations in mind: CHAIR:   A chair should be able to slide under your desk when your back makes contact with the back of the chair. This allows you to work closely.  The chair's height should allow your eyes to be level with the upper part of your monitor and your hands to be slightly lower than your elbows. BODY POSITION  Your feet should make contact with the floor. If this is not possible, use a foot rest.  Keep your ears over your shoulders. This will reduce stress on your neck and low back. INCORRECT SITTING POSTURES   If you are feeling tired and unable to assume a healthy sitting posture, do not slouch or slump. This puts excessive strain on your back tissues, causing more damage and pain. Healthier options include:  Using more support, like a lumbar pillow.  Switching tasks to something that requires you to be upright or walking.  Talking a brief walk.  Lying down to rest in a neutral-spine position. PROLONGED STANDING WHILE SLIGHTLY LEANING FORWARD  When completing a task that requires you to lean forward while standing in one place for a long time, place either foot up on a stationary 2-4 inch high object to help maintain the best posture. When both feet are on the ground, the low back tends to lose its slight inward curve. If this curve flattens (or becomes too large), then the back and your other joints will experience too much stress, fatigue more quickly and can cause pain.  CORRECT STANDING POSTURES Proper standing posture should be assumed with all daily activities, even if they only take a few moments,  like when brushing your teeth. As in sitting, your ears should fall over your shoulders and your shoulders should fall over your hips. You should keep a slight tension in your abdominal muscles to brace your spine. Your tailbone should point down to the ground, not behind your body, resulting in an over-extended swayback posture.  INCORRECT STANDING POSTURES  Common incorrect standing postures include a forward head, locked knees and/or an excessive swayback. WALKING  Walk with an upright posture. Your ears, shoulders and hips should all line-up. PROLONGED ACTIVITY IN A FLEXED POSITION When completing a task that requires you to bend forward at your waist or lean over a low surface, try to find a way to stabilize 3 of 4 of your limbs. You can place a hand or elbow on your thigh or rest a knee on the surface you are reaching across. This will provide you more stability so that your muscles do not fatigue as quickly. By keeping your knees relaxed, or slightly bent, you will also reduce stress across your low back. CORRECT LIFTING TECHNIQUES DO :   Assume a wide stance. This will provide you more stability and the opportunity to get as close as possible to the object which you are lifting.  Tense your abdominals to brace your spine; then bend at the knees and hips. Keeping your back locked in a neutral-spine position, lift using your leg muscles. Lift with your legs, keeping your back straight.  Test the weight of unknown objects before attempting to lift them.  Try to keep your elbows locked down at your sides in order get the best strength from your shoulders when carrying an object.  Always ask for help when lifting heavy or awkward objects. INCORRECT LIFTING TECHNIQUES DO NOT:   Lock your knees when lifting, even if it is a small object.  Bend and twist. Pivot at your feet or move your feet when needing to change directions.  Assume that you cannot safely pick up a paperclip without proper  posture. Document Released: 09/06/2005 Document Revised: 01/21/2014 Document Reviewed: 12/19/2008 [facility] Patient Information 2015 [place], [place]. This information is not intended to replace advice given to you by your health care provider. Make sure you discuss any questions you have with your health care provider.

## 2015-05-13 NOTE — ED Notes (Signed)
Reports lower back pain onset Sunday, now radiating down left hip and leg.  Hurts to walk.  Skin w/d

## 2015-05-15 ENCOUNTER — Encounter (HOSPITAL_COMMUNITY): Payer: Commercial Managed Care - HMO

## 2015-05-16 ENCOUNTER — Encounter: Payer: Self-pay | Admitting: Family Medicine

## 2015-05-16 ENCOUNTER — Other Ambulatory Visit: Payer: Self-pay | Admitting: Family Medicine

## 2015-05-21 ENCOUNTER — Other Ambulatory Visit: Payer: Self-pay

## 2015-05-21 DIAGNOSIS — Z853 Personal history of malignant neoplasm of breast: Secondary | ICD-10-CM | POA: Insufficient documentation

## 2015-05-21 DIAGNOSIS — R413 Other amnesia: Secondary | ICD-10-CM | POA: Insufficient documentation

## 2015-05-21 DIAGNOSIS — H43819 Vitreous degeneration, unspecified eye: Secondary | ICD-10-CM | POA: Insufficient documentation

## 2015-05-21 DIAGNOSIS — K921 Melena: Secondary | ICD-10-CM | POA: Insufficient documentation

## 2015-05-21 DIAGNOSIS — Q159 Congenital malformation of eye, unspecified: Secondary | ICD-10-CM | POA: Insufficient documentation

## 2015-05-21 DIAGNOSIS — H4050X0 Glaucoma secondary to other eye disorders, unspecified eye, stage unspecified: Secondary | ICD-10-CM | POA: Insufficient documentation

## 2015-05-21 DIAGNOSIS — K625 Hemorrhage of anus and rectum: Secondary | ICD-10-CM | POA: Insufficient documentation

## 2015-05-21 DIAGNOSIS — K5090 Crohn's disease, unspecified, without complications: Secondary | ICD-10-CM

## 2015-05-21 DIAGNOSIS — L309 Dermatitis, unspecified: Secondary | ICD-10-CM

## 2015-05-21 HISTORY — DX: Crohn's disease, unspecified, without complications: K50.90

## 2015-05-21 HISTORY — DX: Dermatitis, unspecified: L30.9

## 2015-05-22 ENCOUNTER — Ambulatory Visit: Payer: Self-pay | Admitting: Gastroenterology

## 2015-05-22 ENCOUNTER — Ambulatory Visit (INDEPENDENT_AMBULATORY_CARE_PROVIDER_SITE_OTHER): Payer: Commercial Managed Care - HMO | Admitting: Gastroenterology

## 2015-05-22 ENCOUNTER — Encounter: Payer: Self-pay | Admitting: Gastroenterology

## 2015-05-22 VITALS — BP 120/65 | HR 102 | Temp 98.2°F | Ht 60.0 in | Wt 141.0 lb

## 2015-05-22 DIAGNOSIS — K5090 Crohn's disease, unspecified, without complications: Secondary | ICD-10-CM | POA: Diagnosis not present

## 2015-05-22 NOTE — Progress Notes (Signed)
Primary Care Physician: Carolyn Norris, MD  Primary Gastroenterologist:  Dr. Lucilla Lame  Chief Complaint  Patient presents with  . Crohn's Disease    transferring care from [facility]    HPI: Carolyn Campbell is a 59 y.o. female here follow-up of Crohn's disease. The patient was seen by another gastrologist after not doing well on her Pentasa for her Crohn's disease. The patient was started on Remicade and states that she still has some rectal bleeding but her symptoms are much better. The patient was not started on Imuran at the same time of the Remicade and is now on 5 mg/kg per dose every 8 weeks. The drive to [facility] was too much for her so her gastrologist there contacted me and asked if I would resume her care. The patient has been having some bright red blood per rectum but states that her pain is much better.  Current Outpatient Prescriptions  Medication Sig Dispense Refill  . amLODipine (NORVASC) 10 MG tablet Take 1 tablet by mouth daily.    Marland Kitchen atorvastatin (LIPITOR) 40 MG tablet Take 1 tablet by mouth daily.    . benazepril (LOTENSIN) 20 MG tablet Take 1 tablet by mouth daily.    . cyclobenzaprine (FLEXERIL) 5 MG tablet Take 1 tablet (5 mg total) by mouth every 8 (eight) hours as needed for muscle spasms. 20 tablet 0  . dicyclomine (BENTYL) 20 MG tablet Take 1 tablet (20 mg total) by mouth 3 (three) times daily before meals. 90 tablet 1  . inFLIXimab in sodium chloride 0.9 % Inject 5 mg/kg into the vein every 8 (eight) weeks.    Marland Kitchen levothyroxine (SYNTHROID, LEVOTHROID) 50 MCG tablet TAKE 1 TABLET EVERY DAY 90 tablet 1  . Potassium Chloride ER 20 MEQ TBCR Take 20 mEq by mouth daily. 30 tablet 12  . sertraline (ZOLOFT) 100 MG tablet Take 100 mg by mouth daily.    Marland Kitchen alendronate (FOSAMAX) 70 MG tablet     . omeprazole (PRILOSEC) 20 MG capsule      No current facility-administered medications for this visit.    Allergies as of 05/22/2015 - Review Complete 05/22/2015  Allergen  Reaction Noted  . Morphine and related  11/07/2012    ROS:  General: Negative for anorexia, weight loss, fever, chills, fatigue, weakness. ENT: Negative for hoarseness, difficulty swallowing , nasal congestion. CV: Negative for chest pain, angina, palpitations, dyspnea on exertion, peripheral edema.  Respiratory: Negative for dyspnea at rest, dyspnea on exertion, cough, sputum, wheezing.  GI: See history of present illness. GU:  Negative for dysuria, hematuria, urinary incontinence, urinary frequency, nocturnal urination.  Endo: Negative for unusual weight change.    Physical Examination:   BP 120/65 mmHg  Pulse 102  Temp(Src) 98.2 F (36.8 C)  Ht 5' (1.524 m)  Wt 141 lb (63.957 kg)  BMI 27.54 kg/m2  General: Well-nourished, well-developed in no acute distress.  Eyes: No icterus. Conjunctivae pink. Mouth: Oropharyngeal mucosa moist and pink , no lesions erythema or exudate. Lungs: Clear to auscultation bilaterally. Non-labored. Heart: Regular rate and rhythm, no murmurs rubs or gallops.  Abdomen: Bowel sounds are normal, nontender, nondistended, no hepatosplenomegaly or masses, no abdominal bruits or hernia , no rebound or guarding.   Extremities: No lower extremity edema. No clubbing or deformities. Neuro: Alert and oriented x 3.  Grossly intact. Skin: Warm and dry, no jaundice.   Psych: Alert and cooperative, normal mood and affect.  Labs:    Imaging Studies: No results found.  Assessment and Plan:   Carolyn Campbell is a 60 y.o. y/o female who has a history of Crohn's disease. The patient was started on Pentasa and then reports that she wasn't doing well and decided to go seek medical care in [place]. The patient was escalated to Remicade without Imuran and states that she has been doing better but not completely resolved. The patient will have her C-reactive protein checked to see if her Crohn's is under control. She has been explained the risks of Remicade including  infection anaphylactic shock antibody resistance and MS. The patient agrees to continue the medication and will be set up at the cancer center for infusions of the Remicade.   Note: This dictation was prepared with Dragon dictation along with smaller phrase technology. Any transcriptional errors that result from this process are unintentional.

## 2015-05-23 LAB — HIGH SENSITIVITY CRP: CRP, High Sensitivity: 16.67 mg/L — ABNORMAL HIGH (ref 0.00–3.00)

## 2015-05-27 ENCOUNTER — Encounter: Payer: Self-pay | Admitting: Family Medicine

## 2015-05-27 ENCOUNTER — Ambulatory Visit
Admission: RE | Admit: 2015-05-27 | Discharge: 2015-05-27 | Disposition: A | Discharge status: Home / Self Care | Payer: Commercial Managed Care - HMO | Source: Ambulatory Visit | Attending: Family Medicine | Admitting: Family Medicine

## 2015-05-27 ENCOUNTER — Ambulatory Visit (INDEPENDENT_AMBULATORY_CARE_PROVIDER_SITE_OTHER): Payer: Commercial Managed Care - HMO | Admitting: Family Medicine

## 2015-05-27 VITALS — BP 110/60 | HR 95 | Temp 97.9°F | Resp 16 | Ht 60.0 in | Wt 140.2 lb

## 2015-05-27 DIAGNOSIS — M5137 Other intervertebral disc degeneration, lumbosacral region: Secondary | ICD-10-CM | POA: Insufficient documentation

## 2015-05-27 DIAGNOSIS — K50811 Crohn's disease of both small and large intestine with rectal bleeding: Secondary | ICD-10-CM

## 2015-05-27 DIAGNOSIS — E876 Hypokalemia: Secondary | ICD-10-CM | POA: Diagnosis not present

## 2015-05-27 DIAGNOSIS — M5430 Sciatica, unspecified side: Secondary | ICD-10-CM | POA: Diagnosis not present

## 2015-05-27 DIAGNOSIS — M545 Low back pain: Secondary | ICD-10-CM | POA: Insufficient documentation

## 2015-05-27 DIAGNOSIS — I6930 Unspecified sequelae of cerebral infarction: Secondary | ICD-10-CM | POA: Diagnosis not present

## 2015-05-27 DIAGNOSIS — M5440 Lumbago with sciatica, unspecified side: Secondary | ICD-10-CM | POA: Diagnosis not present

## 2015-05-27 MED ORDER — GABAPENTIN 100 MG PO CAPS: 100.0000 mg | ORAL_CAPSULE | Freq: Three times a day (TID) | ORAL | Status: DC

## 2015-05-27 NOTE — Progress Notes (Signed)
Name: Carolyn Campbell   MRN: [id number]    DOB: 1956-01-07   Date:05/27/2015       Progress Note  Subjective  Chief Complaint  Chief Complaint  Patient presents with  . Back Pain    Transtion into care fro [facility]. 05/13/2015    HPI  Back pain  Complaint of lower back discomfort for the past several weeks with radiation down to the lower extremities bilaterally. She was seen in emergency department where she was given Flexeril. Because of her history of Crohn's disease she was not placed on NSAID. The pain has been relieved somewhat by Flexeril but not completely resolved. There is no history of any traumatic injury.  History of CVA with mild left right-sided weakness  Patient continues to say mild left-sided weakness. Her speech is basically back to normal.  Hypokalemia  Patient was seen by gastroenterologist and placed on potassium again for supplementation at that time. She continues on same. She has a known history of Crohn's disease Past Medical History  Diagnosis Date  . Asthma   . Hypertension   . Hyperlipidemia   . Allergic rhinitis   . Breast cancer   . Esophageal reflux   . Hypothyroidism   . Osteoporosis   . Occlusion, cerebral artery     NOS w/infarction  . 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 Cognitive deficits as late effect of cerebrovascular disease   . Glaucoma     vitreous degeneration  . Crohn's disease of both small and large intestine with rectal bleeding 12/04/2014    Social History  Substance Use Topics  . Smoking status: Never Smoker   . Smokeless tobacco: Never Used  . Alcohol Use: No     Current outpatient prescriptions:  .  alendronate (FOSAMAX) 70 MG tablet, , Disp: , Rfl:  .  amLODipine (NORVASC) 10 MG tablet, Take 1 tablet by mouth daily., Disp: , Rfl:  .  atorvastatin (LIPITOR) 40 MG tablet, Take 1 tablet by mouth daily., Disp: , Rfl:  .  benazepril (LOTENSIN) 20 MG tablet, Take 1 tablet by mouth daily., Disp: , Rfl:   .  cyclobenzaprine (FLEXERIL) 5 MG tablet, Take 1 tablet (5 mg total) by mouth every 8 (eight) hours as needed for muscle spasms., Disp: 20 tablet, Rfl: 0 .  dicyclomine (BENTYL) 20 MG tablet, Take 1 tablet (20 mg total) by mouth 3 (three) times daily before meals., Disp: 90 tablet, Rfl: 1 .  inFLIXimab in sodium chloride 0.9 %, Inject 5 mg/kg into the vein every 8 (eight) weeks., Disp: , Rfl:  .  levothyroxine (SYNTHROID, LEVOTHROID) 50 MCG tablet, TAKE 1 TABLET EVERY DAY, Disp: 90 tablet, Rfl: 1 .  omeprazole (PRILOSEC) 20 MG capsule, , Disp: , Rfl:  .  Potassium Chloride ER 20 MEQ TBCR, Take 20 mEq by mouth daily., Disp: 30 tablet, Rfl: 12 .  sertraline (ZOLOFT) 100 MG tablet, Take 100 mg by mouth daily., Disp: , Rfl:   Allergies  Allergen Reactions  . Morphine And Related     Review of Systems  Constitutional: Negative for fever and chills.  Respiratory: Negative for cough.   Cardiovascular: Negative for chest pain and palpitations.  Gastrointestinal: Positive for abdominal pain, diarrhea and blood in stool.  Genitourinary: Negative for dysuria, urgency, frequency and hematuria.  Musculoskeletal: Positive for back pain and joint pain.  Skin: Negative for rash.  Neurological: Positive for focal weakness and weakness. Negative for headaches.     Objective  Filed Vitals:   05/27/15 1034  BP: 110/60  Pulse: 95  Temp: 97.9 F (36.6 C)  TempSrc: Oral  Resp: 16  Height: 5' (1.524 m)  Weight: 140 lb 3.2 oz (63.594 kg)  SpO2: 97%     Physical Exam  Constitutional: She is oriented to person, place, and time and well-developed, well-nourished, and in no distress.  HENT:  Head: Normocephalic.  Eyes: EOM are normal. Pupils are equal, round, and reactive to light.  Neck: Normal range of motion. No thyromegaly present.  Cardiovascular: Normal rate, regular rhythm and normal heart sounds.   No murmur heard. Pulmonary/Chest: Effort normal and breath sounds normal.   Musculoskeletal: Normal range of motion. She exhibits tenderness (mild tenderness along the lower lumbar segments and the  Sacral region. Straight leg raising is negative except for tight hamstrings.). She exhibits no edema.  Neurological: She is alert and oriented to person, place, and time. No cranial nerve deficit. Gait normal.  There is mild left-sided weakness 5 minus over 5 left upper lower extremity from her former CVA this is basically unchanged  Skin: Skin is warm and dry. No rash noted.  Psychiatric: Memory and affect normal.      Assessment & Plan  1. Midline low back pain with sciatica, sciatica laterality unspecified Resolving - DG Lumbar Spine Complete; Future  2. Sciatic leg pain Resolving - DG Lumbar Spine Complete; Future  3. Hypokalemia Repeat labs on return visit  4. History of CVA with residual deficit Stable  5. Crohn's disease of both small and large intestine with rectal bleeding Stable and followed by gastroenterologist

## 2015-05-27 NOTE — Patient Instructions (Signed)
Sciatica Sciatica is pain, weakness, numbness, or tingling along the path of the sciatic nerve. The nerve starts in the lower back and runs down the back of each leg. The nerve controls the muscles in the lower leg and in the back of the knee, while also providing sensation to the back of the thigh, lower leg, and the sole of your foot. Sciatica is a symptom of another medical condition. For instance, nerve damage or certain conditions, such as a herniated disk or bone spur on the spine, pinch or put pressure on the sciatic nerve. This causes the pain, weakness, or other sensations normally associated with sciatica. Generally, sciatica only affects one side of the body. CAUSES   Herniated or slipped disc.  Degenerative disk disease.  A pain disorder involving the narrow muscle in the buttocks (piriformis syndrome).  Pelvic injury or fracture.  Pregnancy.  Tumor (rare). SYMPTOMS  Symptoms can vary from mild to very severe. The symptoms usually travel from the low back to the buttocks and down the back of the leg. Symptoms can include:  Mild tingling or dull aches in the lower back, leg, or hip.  Numbness in the back of the calf or sole of the foot.  Burning sensations in the lower back, leg, or hip.  Sharp pains in the lower back, leg, or hip.  Leg weakness.  Severe back pain inhibiting movement. These symptoms may get worse with coughing, sneezing, laughing, or prolonged sitting or standing. Also, being overweight may worsen symptoms. DIAGNOSIS  Your caregiver will perform a physical exam to look for common symptoms of sciatica. He or she may ask you to do certain movements or activities that would trigger sciatic nerve pain. Other tests may be performed to find the cause of the sciatica. These may include:  Blood tests.  X-rays.  Imaging tests, such as an MRI or CT scan. TREATMENT  Treatment is directed at the cause of the sciatic pain. Sometimes, treatment is not necessary  and the pain and discomfort goes away on its own. If treatment is needed, your caregiver may suggest:  Over-the-counter medicines to relieve pain.  Prescription medicines, such as anti-inflammatory medicine, muscle relaxants, or narcotics.  Applying heat or ice to the painful area.  Steroid injections to lessen pain, irritation, and inflammation around the nerve.  Reducing activity during periods of pain.  Exercising and stretching to strengthen your abdomen and improve flexibility of your spine. Your caregiver may suggest losing weight if the extra weight makes the back pain worse.  Physical therapy.  Surgery to eliminate what is pressing or pinching the nerve, such as a bone spur or part of a herniated disk. HOME CARE INSTRUCTIONS   Only take over-the-counter or prescription medicines for pain or discomfort as directed by your caregiver.  Apply ice to the affected area for 20 minutes, 3-4 times a day for the first 48-72 hours. Then try heat in the same way.  Exercise, stretch, or perform your usual activities if these do not aggravate your pain.  Attend physical therapy sessions as directed by your caregiver.  Keep all follow-up appointments as directed by your caregiver.  Do not wear high heels or shoes that do not provide proper support.  Check your mattress to see if it is too soft. A firm mattress may lessen your pain and discomfort. SEEK IMMEDIATE MEDICAL CARE IF:   You lose control of your bowel or bladder (incontinence).  You have increasing weakness in the lower back, pelvis, buttocks,   or legs.  You have redness or swelling of your back.  You have a burning sensation when you urinate.  You have pain that gets worse when you lie down or awakens you at night.  Your pain is worse than you have experienced in the past.  Your pain is lasting longer than 4 weeks.  You are suddenly losing weight without reason. MAKE SURE YOU:  Understand these  instructions.  Will watch your condition.  Will get help right away if you are not doing well or get worse. Document Released: 08/31/2001 Document Revised: 03/07/2012 Document Reviewed: 01/16/2012 Exit[employer] Patient Information 2015 [employer]. This information is not intended to replace advice given to you by your health care provider. Make sure you discuss any questions you have with your health care provider.  

## 2015-05-28 ENCOUNTER — Telehealth: Payer: Self-pay

## 2015-05-28 NOTE — Telephone Encounter (Signed)
-----   Message from Lucilla Lame, MD sent at 05/27/2015  7:51 AM EDT ----- Let the patient know that the blood test showed her disease to be active. Therefore she needs to proceed with the Remicaide.

## 2015-05-28 NOTE — Telephone Encounter (Signed)
LVM for pt to return my call.

## 2015-05-29 ENCOUNTER — Other Ambulatory Visit: Payer: Self-pay

## 2015-05-29 DIAGNOSIS — K50919 Crohn's disease, unspecified, with unspecified complications: Secondary | ICD-10-CM

## 2015-05-29 NOTE — Telephone Encounter (Signed)
Pt notified of results as well as referral to the cancer center for Remicade infusions.

## 2015-05-29 NOTE — Telephone Encounter (Signed)
-----   Message from Lucilla Lame, MD sent at 05/27/2015  7:51 AM EDT ----- Let the patient know that the blood test showed her disease to be active. Therefore she needs to proceed with the Remicaide.

## 2015-06-02 ENCOUNTER — Ambulatory Visit
Admission: RE | Admit: 2015-06-02 | Discharge: 2015-06-02 | Disposition: A | Discharge status: Home / Self Care | Payer: Commercial Managed Care - HMO | Source: Ambulatory Visit | Attending: Family Medicine | Admitting: Family Medicine

## 2015-06-02 DIAGNOSIS — Z1231 Encounter for screening mammogram for malignant neoplasm of breast: Secondary | ICD-10-CM | POA: Diagnosis not present

## 2015-06-02 DIAGNOSIS — Z1239 Encounter for other screening for malignant neoplasm of breast: Secondary | ICD-10-CM

## 2015-06-03 ENCOUNTER — Telehealth: Payer: Self-pay

## 2015-06-03 MED ORDER — SODIUM CHLORIDE 0.9 % IV SOLN: 5.0000 mg/kg | INTRAVENOUS | Status: DC

## 2015-06-03 NOTE — Progress Notes (Signed)
Make sure this has been taken care of.

## 2015-06-05 ENCOUNTER — Other Ambulatory Visit: Payer: Self-pay | Admitting: Family Medicine

## 2015-06-05 NOTE — Telephone Encounter (Signed)
Pt has been referred to the cancer center to establish care with physician there. Pt will undergo Remicade infusion every 8 weeks.

## 2015-06-09 ENCOUNTER — Other Ambulatory Visit: Payer: Self-pay | Admitting: Family Medicine

## 2015-06-12 ENCOUNTER — Telehealth: Payer: Self-pay

## 2015-06-12 ENCOUNTER — Telehealth: Payer: Self-pay | Admitting: Family Medicine

## 2015-06-12 NOTE — Telephone Encounter (Signed)
Per michelle with cancer center at [place] said they need a ref to be put in . Looks like someone tried but they but it in the cancer center in of [facility]. PT is coming in at 9am.

## 2015-06-12 NOTE — Telephone Encounter (Signed)
Had tiffnay to do this for they were in high need of it.

## 2015-06-13 ENCOUNTER — Inpatient Hospital Stay: Payer: Commercial Managed Care - HMO | Attending: Oncology | Admitting: Oncology

## 2015-06-13 ENCOUNTER — Inpatient Hospital Stay: Payer: Commercial Managed Care - HMO

## 2015-06-13 VITALS — BP 135/63 | HR 84 | Temp 96.4°F | Resp 20 | Wt 138.7 lb

## 2015-06-13 DIAGNOSIS — K219 Gastro-esophageal reflux disease without esophagitis: Secondary | ICD-10-CM | POA: Insufficient documentation

## 2015-06-13 DIAGNOSIS — E785 Hyperlipidemia, unspecified: Secondary | ICD-10-CM | POA: Diagnosis not present

## 2015-06-13 DIAGNOSIS — E039 Hypothyroidism, unspecified: Secondary | ICD-10-CM | POA: Insufficient documentation

## 2015-06-13 DIAGNOSIS — I10 Essential (primary) hypertension: Secondary | ICD-10-CM | POA: Insufficient documentation

## 2015-06-13 DIAGNOSIS — J45909 Unspecified asthma, uncomplicated: Secondary | ICD-10-CM | POA: Diagnosis not present

## 2015-06-13 DIAGNOSIS — Z79899 Other long term (current) drug therapy: Secondary | ICD-10-CM | POA: Insufficient documentation

## 2015-06-13 DIAGNOSIS — K5090 Crohn's disease, unspecified, without complications: Secondary | ICD-10-CM | POA: Insufficient documentation

## 2015-06-13 DIAGNOSIS — K50919 Crohn's disease, unspecified, with unspecified complications: Secondary | ICD-10-CM

## 2015-06-13 DIAGNOSIS — Z853 Personal history of malignant neoplasm of breast: Secondary | ICD-10-CM | POA: Diagnosis not present

## 2015-06-13 MED ORDER — SODIUM CHLORIDE 0.9 % IV SOLN
5.0000 mg/kg | Freq: Once | INTRAVENOUS | Status: AC
  Administered 2015-06-13: 300 mg via INTRAVENOUS
  Filled 2015-06-13: qty 30

## 2015-06-13 MED ORDER — ACETAMINOPHEN 325 MG PO TABS
650.0000 mg | ORAL_TABLET | Freq: Once | ORAL | Status: AC
  Administered 2015-06-13: 650 mg via ORAL
  Filled 2015-06-13: qty 2

## 2015-06-13 MED ORDER — SODIUM CHLORIDE 0.9 % IV SOLN
Freq: Once | INTRAVENOUS | Status: AC
  Administered 2015-06-13: 10:00:00 via INTRAVENOUS
  Filled 2015-06-13: qty 1000

## 2015-06-13 NOTE — Progress Notes (Signed)
Patient here today to establish care for Remicade infusion, patient referred by Dr. Allen Norris regarding Remicade for Crohn's disease.

## 2015-06-26 ENCOUNTER — Ambulatory Visit: Payer: Commercial Managed Care - HMO | Admitting: Family Medicine

## 2015-06-27 NOTE — Progress Notes (Signed)
[place]  [phone] [phone]  ID: Carolyn Campbell OB: 08/19/1956  MR#: [id number]  IOE#:[id number]  Patient Care Team: Ashok Norris, MD as PCP - General (Unknown Physician Specialty)  CHIEF COMPLAINT:  Chief Complaint  Patient presents with  . New Evaluation    Remicade infusions    INTERVAL HISTORY: Patient is a 59 year old female with a long-standing history of Crohn's disease who is being referred to clinic for Remicade infusions. She currently feels well and is asymptomatic. She denies any recent fevers or illnesses. She has a good appetite and denies weight loss. She has no neurologic complaints. She denies any chest pain or shortness of breath. She denies any abdominal pain. She denies any nausea, vomiting, consultation, or diarrhea. Patient feels at her baseline and offers no specific complaints today.  REVIEW OF SYSTEMS:   Review of Systems  Constitutional: Negative.  Negative for fever and malaise/fatigue.  Respiratory: Negative.   Cardiovascular: Negative.   Gastrointestinal: Negative for diarrhea, blood in stool and melena.  Musculoskeletal: Negative.   Neurological: Negative.  Negative for weakness.    As per HPI. Otherwise, a complete review of systems is negatve.  PAST MEDICAL HISTORY: Past Medical History  Diagnosis Date  . Asthma   . Hypertension   . Hyperlipidemia   . Allergic rhinitis   . Breast cancer   . Esophageal reflux   . Hypothyroidism   . Osteoporosis   . Occlusion, cerebral artery     NOS w/infarction  . 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 Cognitive deficits as late effect of cerebrovascular disease   . Glaucoma     vitreous degeneration  . Crohn's disease of both small and large intestine with rectal bleeding 12/04/2014    PAST SURGICAL HISTORY: Past Surgical History  Procedure Laterality Date  . Tubal ligation    . Breast surgery Left     malignant biopsy  .  Esophagogastroduodenoscopy  2015  . Colonoscopy  2015  . Givens capsule study  2015  . Finger surgery      Right small finger  . Breast lumpectomy Left 09/20/2004    positive    FAMILY HISTORY Family History  Problem Relation Age of Onset  . Heart disease Mother   . Heart attack Mother   . Breast cancer Sister   . Alcohol abuse Father   . Cerebrovascular Accident Father   . Hypertension    . Diabetes    . Breast cancer Sister 32       ADVANCED DIRECTIVES:    HEALTH MAINTENANCE: Social History  Substance Use Topics  . Smoking status: Never Smoker   . Smokeless tobacco: Never Used  . Alcohol Use: No     Colonoscopy:  PAP:  Bone density:  Lipid panel:  Allergies  Allergen Reactions  . Morphine And Related     Current Outpatient Prescriptions  Medication Sig Dispense Refill  . alendronate (FOSAMAX) 70 MG tablet TAKE 1 TABLET ONE TIME WEEKLY 12 tablet 2  . amLODipine (NORVASC) 10 MG tablet Take 1 tablet by mouth daily.    Marland Kitchen atorvastatin (LIPITOR) 40 MG tablet TAKE 1 TABLET EVERY DAY 90 tablet 1  . benazepril (LOTENSIN) 20 MG tablet TAKE 1 TABLET EVERY DAY 90 tablet 0  . inFLIXimab 300 mg in sodium chloride 0.9 % 220 mL Inject 300 mg into the vein every 8 (eight) weeks. 1 each prn  . levothyroxine (SYNTHROID, LEVOTHROID) 50 MCG tablet TAKE 1 TABLET  EVERY DAY 90 tablet 1  . sertraline (ZOLOFT) 100 MG tablet Take 100 mg by mouth daily.    . cyclobenzaprine (FLEXERIL) 5 MG tablet Take 1 tablet (5 mg total) by mouth every 8 (eight) hours as needed for muscle spasms. (Patient not taking: Reported on 06/13/2015) 20 tablet 0  . dicyclomine (BENTYL) 20 MG tablet Take 1 tablet (20 mg total) by mouth 3 (three) times daily before meals. (Patient not taking: Reported on 06/13/2015) 90 tablet 1  . gabapentin (NEURONTIN) 100 MG capsule Take 1 capsule (100 mg total) by mouth 3 (three) times daily. (Patient not taking: Reported on 06/13/2015) 90 capsule 3  . omeprazole (PRILOSEC) 20 MG  capsule     . Potassium Chloride ER 20 MEQ TBCR Take 20 mEq by mouth daily. (Patient not taking: Reported on 06/13/2015) 30 tablet 12   No current facility-administered medications for this visit.    OBJECTIVE: Filed Vitals:   06/13/15 0918  BP: 135/63  Pulse: 84  Temp: 96.4 F (35.8 C)  Resp: 20     Body mass index is 27.08 kg/(m^2).    ECOG FS:0 - Asymptomatic  General: Well-developed, well-nourished, no acute distress. Eyes: Pink conjunctiva, anicteric sclera. HEENT: Normocephalic, moist mucous membranes, clear oropharnyx. Lungs: Clear to auscultation bilaterally. Heart: Regular rate and rhythm. No rubs, murmurs, or gallops. Abdomen: Soft, nontender, nondistended. No organomegaly noted, normoactive bowel sounds. Musculoskeletal: No edema, cyanosis, or clubbing. Neuro: Alert, answering all questions appropriately. Cranial nerves grossly intact. Skin: No rashes or petechiae noted. Psych: Normal affect. Lymphatics: No cervical, calvicular, axillary or inguinal LAD.   LAB RESULTS:  Lab Results  Component Value Date   NA 141 03/13/2015   K 3.4* 03/13/2015   CL 105 03/13/2015   CO2 29 03/13/2015   GLUCOSE 90 03/13/2015   BUN 10 03/13/2015   CREATININE 0.77 03/13/2015   CALCIUM 9.9 03/13/2015   PROT 7.4 03/13/2015   ALBUMIN 4.2 03/13/2015   AST 21 03/13/2015   ALT 22 03/13/2015   ALKPHOS 86 03/13/2015   BILITOT 0.6 03/13/2015   GFRNONAA >60 08/19/2014   GFRAA >60 08/19/2014    Lab Results  Component Value Date   WBC 5.5 03/13/2015   NEUTROABS 2.8 03/13/2015   HGB 13.1 03/13/2015   HCT 39.7 03/13/2015   MCV 86.7 03/13/2015   PLT 218.0 03/13/2015     STUDIES: Mm Digital Screening Bilateral  06/03/2015   CLINICAL DATA:  Screening.  EXAM: DIGITAL SCREENING BILATERAL MAMMOGRAM WITH CAD  COMPARISON:  Previous exam(s).  ACR Breast Density Category c: The breast tissue is heterogeneously dense, which may obscure small masses.  FINDINGS: There are no findings  suspicious for malignancy. Images were processed with CAD.  IMPRESSION: No mammographic evidence of malignancy. A result letter of this screening mammogram will be mailed directly to the patient.  RECOMMENDATION: Screening mammogram in one year. (Code:SM-B-01Y)  BI-RADS CATEGORY  1: Negative.   Electronically Signed   By: Everlean Alstrom M.D.   On: 06/03/2015 08:24    ASSESSMENT: Crohn's disease requiring Remicade infusions.  PLAN:    1. Crohn's disease: Patient will proceed with 300 mg IV Remicade today. She will return to clinic every 8 weeks for additional infusions and then in 6 months for routine evaluation. Patient understands that any laboratory work or questions regarding her Crohn's should be directed at her primary GI physician.  Continue routine colonoscopies per GI.  Patient expressed understanding and was in agreement with this plan. She also understands  that She can call clinic at any time with any questions, concerns, or complaints.    Lloyd Huger, MD   06/27/2015 12:48 PM

## 2015-07-07 ENCOUNTER — Telehealth: Payer: Self-pay | Admitting: Gastroenterology

## 2015-07-07 ENCOUNTER — Other Ambulatory Visit: Payer: Self-pay

## 2015-07-07 ENCOUNTER — Ambulatory Visit (INDEPENDENT_AMBULATORY_CARE_PROVIDER_SITE_OTHER): Payer: Commercial Managed Care - HMO | Admitting: Family Medicine

## 2015-07-07 ENCOUNTER — Encounter: Payer: Self-pay | Admitting: Family Medicine

## 2015-07-07 ENCOUNTER — Ambulatory Visit: Payer: Commercial Managed Care - HMO | Admitting: Family Medicine

## 2015-07-07 VITALS — BP 128/60 | HR 100 | Temp 98.9°F | Resp 16 | Ht 60.0 in | Wt 139.3 lb

## 2015-07-07 DIAGNOSIS — Z23 Encounter for immunization: Secondary | ICD-10-CM

## 2015-07-07 DIAGNOSIS — M5442 Lumbago with sciatica, left side: Secondary | ICD-10-CM

## 2015-07-07 NOTE — Telephone Encounter (Signed)
LVM for pt to return my call.

## 2015-07-07 NOTE — Telephone Encounter (Signed)
Patient called and wanted to know if you got authorization for her to see Dr. Allen Norris from Dr. Rutherford Nail?  She stated that Dr. Walker Kehr office said you would have to call and get it for her insurance. I didn't see anything in her chart.

## 2015-07-07 NOTE — Progress Notes (Signed)
Name: Carolyn Campbell   MRN: [id number]    DOB: 1956/08/28   Date:07/07/2015       Progress Note  Subjective  Chief Complaint  Chief Complaint  Patient presents with  . Back Pain    1 month follow up. Taking medication only as needesd now.    HPI  Follow back pain  Low back pain is now essentially resolved. Flexeril in the past for this. Show show C 12 limits is 11   Past Medical History  Diagnosis Date  . Asthma   . Hypertension   . Hyperlipidemia   . Allergic rhinitis   . Breast cancer ([place])   . Esophageal reflux   . Hypothyroidism   . Osteoporosis   . Occlusion, cerebral artery (HCC)     NOS w/infarction  . 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 Cognitive deficits as late effect of cerebrovascular disease   . Glaucoma     vitreous degeneration  . Crohn's disease of both small and large intestine with rectal bleeding ([place]) 12/04/2014    Social History  Substance Use Topics  . Smoking status: Never Smoker   . Smokeless tobacco: Never Used  . Alcohol Use: No     Current outpatient prescriptions:  .  alendronate (FOSAMAX) 70 MG tablet, TAKE 1 TABLET ONE TIME WEEKLY, Disp: 12 tablet, Rfl: 2 .  amLODipine (NORVASC) 10 MG tablet, Take 1 tablet by mouth daily., Disp: , Rfl:  .  atorvastatin (LIPITOR) 40 MG tablet, TAKE 1 TABLET EVERY DAY, Disp: 90 tablet, Rfl: 1 .  benazepril (LOTENSIN) 20 MG tablet, TAKE 1 TABLET EVERY DAY, Disp: 90 tablet, Rfl: 0 .  cyclobenzaprine (FLEXERIL) 5 MG tablet, Take 1 tablet (5 mg total) by mouth every 8 (eight) hours as needed for muscle spasms. (Patient not taking: Reported on 06/13/2015), Disp: 20 tablet, Rfl: 0 .  dicyclomine (BENTYL) 20 MG tablet, Take 1 tablet (20 mg total) by mouth 3 (three) times daily before meals. (Patient not taking: Reported on 06/13/2015), Disp: 90 tablet, Rfl: 1 .  gabapentin (NEURONTIN) 100 MG capsule, Take 1 capsule (100 mg total) by mouth 3 (three) times daily. (Patient not taking: Reported on  06/13/2015), Disp: 90 capsule, Rfl: 3 .  inFLIXimab 300 mg in sodium chloride 0.9 % 220 mL, Inject 300 mg into the vein every 8 (eight) weeks., Disp: 1 each, Rfl: prn .  levothyroxine (SYNTHROID, LEVOTHROID) 50 MCG tablet, TAKE 1 TABLET EVERY DAY, Disp: 90 tablet, Rfl: 1 .  omeprazole (PRILOSEC) 20 MG capsule, , Disp: , Rfl:  .  Potassium Chloride ER 20 MEQ TBCR, Take 20 mEq by mouth daily. (Patient not taking: Reported on 06/13/2015), Disp: 30 tablet, Rfl: 12 .  sertraline (ZOLOFT) 100 MG tablet, Take 100 mg by mouth daily., Disp: , Rfl:   Allergies  Allergen Reactions  . Morphine And Related     Review of Systems  Constitutional: Negative for fever, chills and weight loss.  HENT: Negative for congestion, hearing loss, sore throat and tinnitus.   Eyes: Negative for blurred vision, double vision and redness.  Respiratory: Negative for cough, hemoptysis and shortness of breath.   Cardiovascular: Negative for chest pain, palpitations, orthopnea, claudication and leg swelling.  Gastrointestinal: Negative for heartburn, nausea, vomiting, diarrhea, constipation and blood in stool.  Genitourinary: Negative for dysuria, urgency, frequency and hematuria.  Musculoskeletal: Positive for back pain (essentially all resolved). Negative for myalgias, joint pain and falls.  Skin: Negative for itching.  Neurological:  Negative for dizziness, tingling, tremors, focal weakness, seizures, loss of consciousness, weakness and headaches.  Endo/Heme/Allergies: Does not bruise/bleed easily.  Psychiatric/Behavioral: Negative for depression and substance abuse. The patient is not nervous/anxious and does not have insomnia.      Objective  Filed Vitals:   07/07/15 1143  BP: 128/60  Pulse: 100  Temp: 98.9 F (37.2 C)  TempSrc: Oral  Resp: 16  Height: 5' (1.524 m)  Weight: 139 lb 4.8 oz (63.186 kg)  SpO2: 96%     Physical Exam  Constitutional: She is oriented to person, place, and time and  well-developed, well-nourished, and in no distress.  HENT:  Head: Normocephalic.  Eyes: EOM are normal. Pupils are equal, round, and reactive to light.  Neck: Normal range of motion. No thyromegaly present.  Cardiovascular: Normal rate, regular rhythm and normal heart sounds.   No murmur heard. Pulmonary/Chest: Effort normal and breath sounds normal.  Abdominal: Soft. Bowel sounds are normal.  Musculoskeletal: She exhibits tenderness (mild left paraspinal muscle spasm in the lumbar area but nontender). She exhibits no edema.  Neurological: She is alert and oriented to person, place, and time. No cranial nerve deficit. Gait normal.  Skin: Skin is warm and dry. No rash noted.  Psychiatric: Memory and affect normal.      Assessment & Plan   1. Need for influenza vaccination Given - Flu Vaccine QUAD 36+ mos PF IM (Fluarix & Fluzone Quad PF)  2. Left-sided low back pain with left-sided sciatica Essentially resolved  3. Hypertension  Well-controlled Well-controlled

## 2015-07-08 ENCOUNTER — Telehealth: Payer: Self-pay | Admitting: Family Medicine

## 2015-07-08 NOTE — Telephone Encounter (Signed)
Open in Error.

## 2015-07-08 NOTE — Telephone Encounter (Signed)
Patient is needing a referral to Dr Durwin Reges (who see her for her disease) and for Dr Grayland Ormond (who does her Transfusions/Infusion)

## 2015-07-10 NOTE — Telephone Encounter (Signed)
LVM again for pt to return my call.  

## 2015-07-15 NOTE — Telephone Encounter (Signed)
Obtained [employer] referral for pt to see Dr. Allen Norris ICD-10: K50.90,  I also looked in the acuity portal and she already has authorization for Dr. Grayland Ormond.  Start - 07/15/15 Expires - 01/11/16 ICD-10: K50.90

## 2015-07-28 ENCOUNTER — Telehealth: Payer: Self-pay | Admitting: Gastroenterology

## 2015-07-28 NOTE — Telephone Encounter (Signed)
Patient left a voice message that she will not be able to take the Rem[employer] because she can't assistance fund. Please call.

## 2015-07-30 NOTE — Telephone Encounter (Signed)
Pt can no longer continue with Remicade injections due to cost. Please advise what we can change her medication to. Pt has Crohn's disease. Pt was last seen in office with you on 05-22-15.

## 2015-07-31 NOTE — Telephone Encounter (Signed)
Findings at if there is something else that her insurance will cover if not then she should be started on Imuran 50 mg a day with her blood count being checked every week for 4 weeks including a CBC

## 2015-08-01 NOTE — Telephone Encounter (Signed)
LVM for pt to return my call.

## 2015-08-05 ENCOUNTER — Other Ambulatory Visit: Payer: Self-pay

## 2015-08-05 DIAGNOSIS — K50919 Crohn's disease, unspecified, with unspecified complications: Secondary | ICD-10-CM

## 2015-08-05 MED ORDER — AZATHIOPRINE 50 MG PO TABS
50.0000 mg | ORAL_TABLET | Freq: Every day | ORAL | Status: DC
Start: 2015-08-05 — End: 2015-10-07

## 2015-08-05 NOTE — Telephone Encounter (Signed)
Pt notified of recommendation. Rx for Imuran 50mg  sent to pt's pharmacy. Pt also made aware that a CBC will need to be done every 4 weeks.

## 2015-08-08 ENCOUNTER — Ambulatory Visit: Payer: Commercial Managed Care - HMO

## 2015-08-28 ENCOUNTER — Telehealth: Payer: Self-pay | Admitting: Gastroenterology

## 2015-08-28 NOTE — Telephone Encounter (Signed)
Is there any medication she can take to help after she eats. Her stomach is hurting. Can you recommend anything.

## 2015-08-29 NOTE — Telephone Encounter (Signed)
Pt stated her stomach hurts most days. She is having nausea as well. Advised her to take pepto and gasx over the weekend. Please advise.

## 2015-09-01 NOTE — Telephone Encounter (Signed)
I have been following her for Crohn's. This is something new and I don't know what is causing her abd pain when she eats. She can try a PPI and see if it helps.Marland Kitchen

## 2015-09-01 NOTE — Telephone Encounter (Signed)
Pt has been notified to restart her Omeprazole 40mg  daily. If absolutely no improvement in her symptoms after 1 week, then she is to call me back. If she is experiencing some improvement she is to continue taking daily.

## 2015-09-23 ENCOUNTER — Other Ambulatory Visit: Payer: Self-pay

## 2015-09-23 MED ORDER — SERTRALINE HCL 100 MG PO TABS: 100.0000 mg | ORAL_TABLET | Freq: Every day | ORAL | Status: DC

## 2015-10-03 ENCOUNTER — Ambulatory Visit: Payer: Commercial Managed Care - HMO

## 2015-10-06 ENCOUNTER — Other Ambulatory Visit: Payer: Self-pay

## 2015-10-06 ENCOUNTER — Telehealth: Payer: Self-pay | Admitting: Gastroenterology

## 2015-10-06 DIAGNOSIS — K50919 Crohn's disease, unspecified, with unspecified complications: Secondary | ICD-10-CM

## 2015-10-06 NOTE — Telephone Encounter (Signed)
Patient needs a refill on her azathioprine she would like to use a mail pharmacy, [facility] # [phone] Patient only had the phone number to the company

## 2015-10-07 ENCOUNTER — Other Ambulatory Visit: Payer: Self-pay

## 2015-10-07 DIAGNOSIS — K50919 Crohn's disease, unspecified, with unspecified complications: Secondary | ICD-10-CM

## 2015-10-07 MED ORDER — AZATHIOPRINE 50 MG PO TABS: 50.0000 mg | ORAL_TABLET | Freq: Every day | ORAL | Status: DC

## 2015-10-07 NOTE — Telephone Encounter (Signed)
Rx has been faxed to [facility] mail order pharmacy per pt request. [phone]. Pt aware this was sent.

## 2015-11-04 ENCOUNTER — Other Ambulatory Visit: Payer: Self-pay | Admitting: Family Medicine

## 2015-11-06 ENCOUNTER — Ambulatory Visit: Payer: Commercial Managed Care - HMO | Admitting: Family Medicine

## 2015-11-06 ENCOUNTER — Telehealth: Payer: Self-pay | Admitting: Emergency Medicine

## 2015-11-06 MED ORDER — SERTRALINE HCL 100 MG PO TABS: 100.0000 mg | ORAL_TABLET | Freq: Every day | ORAL | Status: DC

## 2015-11-07 NOTE — Telephone Encounter (Signed)
Script sent  

## 2015-11-20 ENCOUNTER — Telehealth: Payer: Self-pay | Admitting: Family Medicine

## 2015-11-20 NOTE — Telephone Encounter (Signed)
Patient has been r/s to see Dr. Rutherford Nail on 12/24/15 @ 11am.  Patient is needing a refill on her blood pressure medication.  She was getting the medication separate (Amlodipine 43m & Benzepril 240m and she wants to know if the [facility].

## 2015-11-27 ENCOUNTER — Ambulatory Visit: Payer: Commercial Managed Care - HMO | Admitting: Family Medicine

## 2015-11-28 ENCOUNTER — Ambulatory Visit: Payer: Commercial Managed Care - HMO | Admitting: Oncology

## 2015-11-28 ENCOUNTER — Ambulatory Visit: Payer: Commercial Managed Care - HMO

## 2015-12-19 ENCOUNTER — Ambulatory Visit (INDEPENDENT_AMBULATORY_CARE_PROVIDER_SITE_OTHER): Payer: PPO | Admitting: Family Medicine

## 2015-12-19 ENCOUNTER — Ambulatory Visit
Admission: RE | Admit: 2015-12-19 | Discharge: 2015-12-19 | Disposition: A | Discharge status: Home / Self Care | Payer: PPO | Source: Ambulatory Visit | Attending: Family Medicine | Admitting: Family Medicine

## 2015-12-19 ENCOUNTER — Encounter: Payer: Self-pay | Admitting: Family Medicine

## 2015-12-19 VITALS — BP 126/70 | HR 92 | Temp 98.8°F | Resp 18 | Ht 60.0 in | Wt 142.6 lb

## 2015-12-19 DIAGNOSIS — J111 Influenza due to unidentified influenza virus with other respiratory manifestations: Secondary | ICD-10-CM

## 2015-12-19 DIAGNOSIS — R05 Cough: Secondary | ICD-10-CM

## 2015-12-19 DIAGNOSIS — R059 Cough, unspecified: Secondary | ICD-10-CM

## 2015-12-19 DIAGNOSIS — J4521 Mild intermittent asthma with (acute) exacerbation: Secondary | ICD-10-CM | POA: Diagnosis not present

## 2015-12-19 DIAGNOSIS — I700 Atherosclerosis of aorta: Secondary | ICD-10-CM

## 2015-12-19 MED ORDER — FLUTICASONE FUROATE-VILANTEROL 100-25 MCG/INH IN AEPB: 1.0000 | INHALATION_SPRAY | Freq: Every day | RESPIRATORY_TRACT | Status: DC

## 2015-12-19 MED ORDER — GUAIFENESIN ER 600 MG PO TB12: 600.0000 mg | ORAL_TABLET | Freq: Two times a day (BID) | ORAL | Status: DC

## 2015-12-19 NOTE — Progress Notes (Addendum)
Name: Carolyn Campbell   MRN: [id number]    DOB: 29-May-1956   Date:12/19/2015       Progress Note  Subjective  Chief Complaint  Chief Complaint  Patient presents with  . Cough    patient states she is having a dry cough and she feels drainage but unable to produce anything, feels as if something in her chest.    HPI  Flu: she was diagnosed with Flu at local urgent care 6 days ago. She had Flu type A, she was given Tamiflu, she states rhinorrhea, fatigue, fever, body aches have improved, however has a dry cough and feels congested in her chest. She has a history of asthma, but did not start using her rescue inhaler. She denies wheezing, but she has mild SOB with activity    Patient Active Problem List   Diagnosis Date Noted  . Vitreous degeneration 05/21/2015  . Bleeding per rectum 05/21/2015  . Bad memory 05/21/2015  . Chronic ulcerative proctitis ([place]) 05/21/2015  . Personal history of malignant neoplasm of breast 05/21/2015  . Glaucoma associated with chamber angle anomalies 05/21/2015  . Dermatitis, eczematoid 05/21/2015  . CD (Crohn's disease) ([place]) 05/21/2015  . H/O malignant neoplasm of breast 05/21/2015  . Crohn's disease of both small and large intestine with rectal bleeding ([place]) 12/04/2014  . Adrenal adenoma 10/19/2013  . Shortness of breath 11/07/2012  . Solitary pulmonary nodule 11/07/2012  . 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 Cognitive deficits as late effect of cerebrovascular disease 01/20/2010  . OP (osteoporosis) 10/20/2009  . Cerebral vascular accident ([place]) 04/28/2009  . Adult hypothyroidism 06/12/2008  . Acid reflux 05/23/2007  . Hypercholesterolemia without hypertriglyceridemia 01/18/2007  . Benign essential HTN 01/18/2007    Past Surgical History  Procedure Laterality Date  . Tubal ligation    . Breast surgery Left     malignant biopsy  . Esophagogastroduodenoscopy  2015  . Colonoscopy  2015  . Givens capsule study   2015  . Finger surgery      Right small finger  . Breast lumpectomy Left 09/20/2004    positive    Family History  Problem Relation Age of Onset  . Heart disease Mother   . Heart attack Mother   . Breast cancer Sister   . Alcohol abuse Father   . Cerebrovascular Accident Father   . Hypertension    . Diabetes    . Breast cancer Sister 29    Social History   Social History  . Marital Status: Married    Spouse Name: N/A  . Number of Children: N/A  . Years of Education: N/A   Occupational History  . Not on file.   Social History Main Topics  . Smoking status: Never Smoker   . Smokeless tobacco: Never Used  . Alcohol Use: No  . Drug Use: No  . Sexual Activity: Not on file   Other Topics Concern  . Not on file   Social History Narrative   Married. One son and one daughter. She is disabled after she had breast cancer and strokes. 2 caffeinated beverages daily.     Current outpatient prescriptions:  .  alendronate (FOSAMAX) 70 MG tablet, TAKE 1 TABLET ONE TIME WEEKLY, Disp: 12 tablet, Rfl: 2 .  amLODipine (NORVASC) 10 MG tablet, Take 1 tablet by mouth daily., Disp: , Rfl:  .  atorvastatin (LIPITOR) 40 MG tablet, TAKE 1 TABLET EVERY DAY, Disp: 90 tablet, Rfl: 1 .  azaTHIOprine (IMURAN)  50 MG tablet, Take 1 tablet (50 mg total) by mouth daily., Disp: 90 tablet, Rfl: 3 .  benazepril (LOTENSIN) 20 MG tablet, TAKE 1 TABLET EVERY DAY, Disp: 90 tablet, Rfl: 0 .  cyclobenzaprine (FLEXERIL) 5 MG tablet, Take 1 tablet (5 mg total) by mouth every 8 (eight) hours as needed for muscle spasms., Disp: 20 tablet, Rfl: 0 .  dicyclomine (BENTYL) 20 MG tablet, Take 1 tablet (20 mg total) by mouth 3 (three) times daily before meals., Disp: 90 tablet, Rfl: 1 .  fluticasone (FLONASE) 50 MCG/ACT nasal spray, , Disp: , Rfl:  .  gabapentin (NEURONTIN) 100 MG capsule, Take 1 capsule (100 mg total) by mouth 3 (three) times daily., Disp: 90 capsule, Rfl: 3 .  inFLIXimab 300 mg in sodium chloride 0.9  % 220 mL, Inject 300 mg into the vein every 8 (eight) weeks., Disp: 1 each, Rfl: prn .  levothyroxine (SYNTHROID, LEVOTHROID) 50 MCG tablet, TAKE 1 TABLET EVERY DAY, Disp: 90 tablet, Rfl: 1 .  omeprazole (PRILOSEC) 20 MG capsule, , Disp: , Rfl:  .  oseltamivir (TAMIFLU) 75 MG capsule, , Disp: , Rfl:  .  sertraline (ZOLOFT) 100 MG tablet, Take 1 tablet (100 mg total) by mouth daily., Disp: 90 tablet, Rfl: 0 .  fluticasone furoate-vilanterol (BREO ELLIPTA) 100-25 MCG/INH AEPB, Inhale 1 puff into the lungs daily., Disp: 60 each, Rfl: 0 .  guaiFENesin (MUCINEX) 600 MG 12 hr tablet, Take 1 tablet (600 mg total) by mouth 2 (two) times daily., Disp: 60 tablet, Rfl: 0  Allergies  Allergen Reactions  . Morphine And Related      ROS  Ten systems reviewed and is negative except as mentioned in HPI   Objective  Filed Vitals:   12/19/15 1335  BP: 126/70  Pulse: 92  Temp: 98.8 F (37.1 C)  TempSrc: Oral  Resp: 18  Height: 5' (1.524 m)  Weight: 142 lb 9.6 oz (64.683 kg)  SpO2: 96%    Body mass index is 27.85 kg/(m^2).  Physical Exam  Constitutional: Patient appears well-developed and well-nourished.  No distress.  HEENT: head atraumatic, normocephalic, pupils equal and reactive to light, ears normal TM bilaterally,  neck supple, throat within normal limits Cardiovascular: Normal rate, regular rhythm and normal heart sounds.  No murmur heard. No BLE edema. Pulmonary/Chest: Effort normal with occasional rhonchi. No respiratory distress. Abdominal: Soft.  There is no tenderness. Psychiatric: Patient has a normal mood and affect. behavior is normal. Judgment and thought content normal.  PHQ2/9: Depression screen [facility] 2/9 12/19/2015 07/07/2015  Decreased Interest 0 0  Down, Depressed, Hopeless 0 0  PHQ - 2 Score 0 0     Fall Risk: Fall Risk  12/19/2015 07/07/2015 05/27/2015  Falls in the past year? No No No     Functional Status Survey: Is the patient deaf or have difficulty  hearing?: Yes (left ear problems) Does the patient have difficulty seeing, even when wearing glasses/contacts?: Yes (glasses) Does the patient have difficulty concentrating, remembering, or making decisions?: No Does the patient have difficulty walking or climbing stairs?: No Does the patient have difficulty dressing or bathing?: No Does the patient have difficulty doing errands alone such as visiting a doctor's office or shopping?: No    Assessment & Plan  1. Flu  Treated on Saturday at Urgent Care with Tamiflu  2. Acute asthma exacerbation, mild intermittent  - fluticasone furoate-vilanterol (BREO ELLIPTA) 100-25 MCG/INH AEPB; Inhale 1 puff into the lungs daily.  Dispense: 60 each; Refill:  0 - guaiFENesin (MUCINEX) 600 MG 12 hr tablet; Take 1 tablet (600 mg total) by mouth 2 (two) times daily.  Dispense: 60 tablet; Refill: 0  3. Cough  Since flu, other symptoms have improved, but having chest congestion and is worried about pneumonia. She is immuno compromised with a history of chron's disease and take Imuran  - DG Chest 2 View; Future

## 2015-12-24 ENCOUNTER — Ambulatory Visit: Payer: Commercial Managed Care - HMO | Admitting: Family Medicine

## 2016-01-27 NOTE — Telephone Encounter (Signed)
Appointment made for patient to come in to office

## 2016-01-31 ENCOUNTER — Other Ambulatory Visit: Payer: Self-pay | Admitting: Family Medicine

## 2016-02-09 ENCOUNTER — Ambulatory Visit (INDEPENDENT_AMBULATORY_CARE_PROVIDER_SITE_OTHER): Payer: PPO | Admitting: Family Medicine

## 2016-02-09 ENCOUNTER — Encounter: Payer: Self-pay | Admitting: Family Medicine

## 2016-02-09 VITALS — BP 140/68 | HR 96 | Temp 98.7°F | Resp 14 | Ht 60.0 in | Wt 136.3 lb

## 2016-02-09 DIAGNOSIS — B001 Herpesviral vesicular dermatitis: Secondary | ICD-10-CM

## 2016-02-09 DIAGNOSIS — I10 Essential (primary) hypertension: Secondary | ICD-10-CM | POA: Diagnosis not present

## 2016-02-09 DIAGNOSIS — E038 Other specified hypothyroidism: Secondary | ICD-10-CM

## 2016-02-09 DIAGNOSIS — F325 Major depressive disorder, single episode, in full remission: Secondary | ICD-10-CM

## 2016-02-09 DIAGNOSIS — K50811 Crohn's disease of both small and large intestine with rectal bleeding: Secondary | ICD-10-CM

## 2016-02-09 DIAGNOSIS — M549 Dorsalgia, unspecified: Secondary | ICD-10-CM | POA: Diagnosis not present

## 2016-02-09 DIAGNOSIS — I700 Atherosclerosis of aorta: Secondary | ICD-10-CM

## 2016-02-09 DIAGNOSIS — M810 Age-related osteoporosis without current pathological fracture: Secondary | ICD-10-CM | POA: Diagnosis not present

## 2016-02-09 DIAGNOSIS — E7800 Pure hypercholesterolemia, unspecified: Secondary | ICD-10-CM | POA: Diagnosis not present

## 2016-02-09 DIAGNOSIS — G8929 Other chronic pain: Secondary | ICD-10-CM | POA: Diagnosis not present

## 2016-02-09 MED ORDER — LEVOTHYROXINE SODIUM 50 MCG PO TABS: 50.0000 ug | ORAL_TABLET | Freq: Every day | ORAL | Status: DC

## 2016-02-09 MED ORDER — SERTRALINE HCL 100 MG PO TABS: 100.0000 mg | ORAL_TABLET | Freq: Every day | ORAL | Status: DC

## 2016-02-09 MED ORDER — ALENDRONATE SODIUM 70 MG PO TABS: 70.0000 mg | ORAL_TABLET | ORAL | Status: DC

## 2016-02-09 MED ORDER — GABAPENTIN 100 MG PO CAPS: 100.0000 mg | ORAL_CAPSULE | Freq: Every day | ORAL | Status: DC

## 2016-02-09 MED ORDER — AMLODIPINE BESY-BENAZEPRIL HCL 10-20 MG PO CAPS: 1.0000 | ORAL_CAPSULE | Freq: Every day | ORAL | Status: DC

## 2016-02-09 MED ORDER — VALACYCLOVIR HCL 1 G PO TABS: 1000.0000 mg | ORAL_TABLET | Freq: Two times a day (BID) | ORAL | Status: DC

## 2016-02-09 MED ORDER — ATORVASTATIN CALCIUM 40 MG PO TABS: 40.0000 mg | ORAL_TABLET | Freq: Every day | ORAL | Status: DC

## 2016-02-09 NOTE — Progress Notes (Signed)
Name: Carolyn Campbell   MRN: [id number]    DOB: 19-Dec-1955   Date:02/09/2016       Progress Note  Subjective  Chief Complaint  Chief Complaint  Patient presents with  . Medication Refill    6 MONTH F/U  . Hypertension    Patient would like to discuss taking BP and Cholestrol medication as a combination medication.   . Hyperlipidemia  . Depression    Well controlled with medication  . Hypothyroidism    Unchanged    HPI  HTN: taking medication, but would like to go back on combination Lotrel. She denies chest pain or palpitation  Hyperlipidemia and Aortic Atherosclerosis: she stopped Lipitor on her own, due for labs, explained importance of compliance to decrease chance of heart attack and strokes.  Chron's: can't take aspirin because it increased her rectal bleeding. Doing well on Imuran, sees Dr. Durwin Reges. She has intermittent episodes of diarrhea and abdominal pain, no recent episodes of bleeding  Hypothyroidism: on current dose for a long time, no constipation, dysphagia .  Major Depression: she has been on Zoloft for many years, tried to wean self off but felt sad, she is currently in remission. No sadness, lack of energy or anhedonia.   Osteoporosis: taking medication as prescribed we will check vitamin D level  Patient Active Problem List   Diagnosis Date Noted  . Atherosclerosis of aorta ([place]) 12/19/2015  . Vitreous degeneration 05/21/2015  . Bad memory 05/21/2015  . Chronic ulcerative proctitis ([place]) 05/21/2015  . Glaucoma associated with chamber angle anomalies 05/21/2015  . Dermatitis, eczematoid 05/21/2015  . CD (Crohn's disease) ([place]) 05/21/2015  . H/O malignant neoplasm of breast 05/21/2015  . Crohn's disease of both small and large intestine with rectal bleeding ([place]) 12/04/2014  . Solitary pulmonary nodule 11/07/2012  . 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 Cognitive deficits as late effect of cerebrovascular disease 01/20/2010  . OP  (osteoporosis) 10/20/2009  . Cerebral vascular accident ([place]) 04/28/2009  . Adult hypothyroidism 06/12/2008  . Acid reflux 05/23/2007  . Hypercholesterolemia without hypertriglyceridemia 01/18/2007  . Benign essential HTN 01/18/2007    Past Surgical History  Procedure Laterality Date  . Tubal ligation    . Breast surgery Left     malignant biopsy  . Esophagogastroduodenoscopy  2015  . Colonoscopy  2015  . Givens capsule study  2015  . Finger surgery      Right small finger  . Breast lumpectomy Left 09/20/2004    positive    Family History  Problem Relation Age of Onset  . Heart disease Mother   . Heart attack Mother   . Breast cancer Sister   . Alcohol abuse Father   . Cerebrovascular Accident Father   . Hypertension    . Diabetes    . Breast cancer Sister 25    Social History   Social History  . Marital Status: Married    Spouse Name: N/A  . Number of Children: N/A  . Years of Education: N/A   Occupational History  . Not on file.   Social History Main Topics  . Smoking status: Never Smoker   . Smokeless tobacco: Never Used  . Alcohol Use: No  . Drug Use: No  . Sexual Activity:    Partners: Male   Other Topics Concern  . Not on file   Social History Narrative   Married. One son and one daughter. She is disabled after she had breast cancer and strokes.  2 caffeinated beverages daily.     Current outpatient prescriptions:  .  alendronate (FOSAMAX) 70 MG tablet, Take 1 tablet (70 mg total) by mouth once a week. Take with a full glass of water on an empty stomach., Disp: 12 tablet, Rfl: 1 .  amLODipine-benazepril (LOTREL) 10-20 MG capsule, Take 1 capsule by mouth daily., Disp: 90 capsule, Rfl: 1 .  atorvastatin (LIPITOR) 40 MG tablet, Take 1 tablet (40 mg total) by mouth daily., Disp: 90 tablet, Rfl: 1 .  azaTHIOprine (IMURAN) 50 MG tablet, Take 1 tablet (50 mg total) by mouth daily., Disp: 90 tablet, Rfl: 3 .  dicyclomine (BENTYL) 20 MG tablet, Take 1  tablet (20 mg total) by mouth 3 (three) times daily before meals., Disp: 90 tablet, Rfl: 1 .  fluticasone (FLONASE) 50 MCG/ACT nasal spray, , Disp: , Rfl:  .  gabapentin (NEURONTIN) 100 MG capsule, Take 1-3 capsules (100-300 mg total) by mouth at bedtime., Disp: 270 capsule, Rfl: 1 .  levothyroxine (SYNTHROID, LEVOTHROID) 50 MCG tablet, Take 1 tablet (50 mcg total) by mouth daily., Disp: 90 tablet, Rfl: 1 .  sertraline (ZOLOFT) 100 MG tablet, Take 1 tablet (100 mg total) by mouth daily., Disp: 90 tablet, Rfl: 1 .  valACYclovir (VALTREX) 1000 MG tablet, Take 1 tablet (1,000 mg total) by mouth 2 (two) times daily., Disp: 30 tablet, Rfl: 0  Allergies  Allergen Reactions  . Morphine And Related      ROS  Constitutional: Negative for fever, positive for weight change ( changed life style - trying to lose weight )  Respiratory: Negative for cough and shortness of breath.   Cardiovascular: Negative for chest pain or palpitations.  Gastrointestinal: Positive for abdominal pain, no bowel changes.  Musculoskeletal: Negative for gait problem or joint swelling.  Skin: Negative for rash.  Neurological: Negative for dizziness or headache.  No other specific complaints in a complete review of systems (except as listed in HPI above).  Objective  Filed Vitals:   02/09/16 1016  BP: 140/68  Pulse: 96  Temp: 98.7 F (37.1 C)  TempSrc: Oral  Resp: 14  Height: 5' (1.524 m)  Weight: 136 lb 4.8 oz (61.825 kg)  SpO2: 96%    Body mass index is 26.62 kg/(m^2).  Physical Exam  Constitutional: Patient appears well-developed and well-nourished.  No distress.  HEENT: head atraumatic, normocephalic, pupils equal and reactive to light, ears normal, neck supple, throat within normal limits, no goiter Cardiovascular: Normal rate, regular rhythm and normal heart sounds.  No murmur heard. No BLE edema. Pulmonary/Chest: Effort normal and breath sounds normal. No respiratory distress. Abdominal: Soft.  There  is no tenderness. Psychiatric: Patient has a normal mood and affect. behavior is normal. Judgment and thought content normal. Muscular Skeletal: negative straight leg raise  PHQ2/9: Depression screen [facility] 2/9 12/19/2015 07/07/2015  Decreased Interest 0 0  Down, Depressed, Hopeless 0 0  PHQ - 2 Score 0 0    Fall Risk: Fall Risk  12/19/2015 07/07/2015 05/27/2015  Falls in the past year? No No No     Assessment & Plan  1. Crohn's disease of both small and large intestine with rectal bleeding (HCC)  - CBC with Differential/Platelet - C-reactive protein  2. Hypercholesterolemia without hypertriglyceridemia  - atorvastatin (LIPITOR) 40 MG tablet; Take 1 tablet (40 mg total) by mouth daily.  Dispense: 90 tablet; Refill: 1 - Lipid panel  3. Other specified hypothyroidism  - levothyroxine (SYNTHROID, LEVOTHROID) 50 MCG tablet; Take 1 tablet (50 mcg  total) by mouth daily.  Dispense: 90 tablet; Refill: 1 - TSH  4. Atherosclerosis of aorta (HCC)  - atorvastatin (LIPITOR) 40 MG tablet; Take 1 tablet (40 mg total) by mouth daily.  Dispense: 90 tablet; Refill: 1 - Lipid panel  5. Benign essential HTN  - amLODipine-benazepril (LOTREL) 10-20 MG capsule; Take 1 capsule by mouth daily.  Dispense: 90 capsule; Refill: 1 - Comprehensive metabolic panel  6. Chronic back pain  - gabapentin (NEURONTIN) 100 MG capsule; Take 1-3 capsules (100-300 mg total) by mouth at bedtime.  Dispense: 270 capsule; Refill: 1  7. OP (osteoporosis)  - alendronate (FOSAMAX) 70 MG tablet; Take 1 tablet (70 mg total) by mouth once a week. Take with a full glass of water on an empty stomach.  Dispense: 12 tablet; Refill: 1 - VITAMIN D 25 Hydroxy (Vit-D Deficiency, Fractures)  8. Fever blister  Recurrent on upper lip, we will try Valtrex prn for 1 to two days - valACYclovir (VALTREX) 1000 MG tablet; Take 1 tablet (1,000 mg total) by mouth 2 (two) times daily.  Dispense: 30 tablet; Refill: 0  9. Major depression  in remission (HCC)  - sertraline (ZOLOFT) 100 MG tablet; Take 1 tablet (100 mg total) by mouth daily.  Dispense: 90 tablet; Refill: 1

## 2016-02-16 ENCOUNTER — Other Ambulatory Visit: Payer: Self-pay | Admitting: Family Medicine

## 2016-02-17 ENCOUNTER — Other Ambulatory Visit: Payer: Self-pay | Admitting: Family Medicine

## 2016-02-19 ENCOUNTER — Other Ambulatory Visit: Payer: Self-pay | Admitting: Family Medicine

## 2016-02-19 DIAGNOSIS — B001 Herpesviral vesicular dermatitis: Secondary | ICD-10-CM

## 2016-02-19 MED ORDER — VALACYCLOVIR HCL 1 G PO TABS: 1000.0000 mg | ORAL_TABLET | Freq: Two times a day (BID) | ORAL | Status: DC

## 2016-02-20 LAB — CBC WITH DIFFERENTIAL/PLATELET
Basophils Absolute: 0 10*3/uL (ref 0.0–0.2)
Basos: 1 %
EOS (ABSOLUTE): 0 10*3/uL (ref 0.0–0.4)
Eos: 1 %
Hematocrit: 36.6 % (ref 34.0–46.6)
Hemoglobin: 12.7 g/dL (ref 11.1–15.9)
Immature Grans (Abs): 0 10*3/uL (ref 0.0–0.1)
Immature Granulocytes: 0 %
Lymphocytes Absolute: 1.9 10*3/uL (ref 0.7–3.1)
Lymphs: 40 %
MCH: 28.9 pg (ref 26.6–33.0)
MCHC: 34.7 g/dL (ref 31.5–35.7)
MCV: 83 fL (ref 79–97)
Monocytes Absolute: 0.4 10*3/uL (ref 0.1–0.9)
Monocytes: 8 %
Neutrophils Absolute: 2.3 10*3/uL (ref 1.4–7.0)
Neutrophils: 50 %
Platelets: 225 10*3/uL (ref 150–379)
RBC: 4.39 x10E6/uL (ref 3.77–5.28)
RDW: 14.4 % (ref 12.3–15.4)
WBC: 4.6 10*3/uL (ref 3.4–10.8)

## 2016-02-20 LAB — LIPID PANEL
Chol/HDL Ratio: 2.2 ratio units (ref 0.0–4.4)
Cholesterol, Total: 221 mg/dL — ABNORMAL HIGH (ref 100–199)
HDL: 102 mg/dL (ref >39)
LDL Calculated: 109 mg/dL — ABNORMAL HIGH (ref 0–99)
Triglycerides: 51 mg/dL (ref 0–149)
VLDL Cholesterol Cal: 10 mg/dL (ref 5–40)

## 2016-02-20 LAB — COMPREHENSIVE METABOLIC PANEL
ALT: 17 IU/L (ref 0–32)
AST: 21 IU/L (ref 0–40)
Albumin/Globulin Ratio: 1.6 (ref 1.2–2.2)
Albumin: 4.3 g/dL (ref 3.6–4.8)
Alkaline Phosphatase: 85 IU/L (ref 39–117)
BUN/Creatinine Ratio: 17 (ref 12–28)
BUN: 13 mg/dL (ref 8–27)
Bilirubin Total: 0.5 mg/dL (ref 0.0–1.2)
CO2: 22 mmol/L (ref 18–29)
Calcium: 9.7 mg/dL (ref 8.7–10.3)
Chloride: 101 mmol/L (ref 96–106)
Creatinine, Ser: 0.75 mg/dL (ref 0.57–1.00)
GFR calc Af Amer: 99 mL/min/{1.73_m2} (ref >59)
GFR calc non Af Amer: 86 mL/min/{1.73_m2} (ref >59)
Globulin, Total: 2.7 g/dL (ref 1.5–4.5)
Glucose: 99 mg/dL (ref 65–99)
Potassium: 3.8 mmol/L (ref 3.5–5.2)
Sodium: 142 mmol/L (ref 134–144)
Total Protein: 7 g/dL (ref 6.0–8.5)

## 2016-02-20 LAB — C-REACTIVE PROTEIN: CRP: 6.7 mg/L — ABNORMAL HIGH (ref 0.0–4.9)

## 2016-02-20 LAB — VITAMIN D 25 HYDROXY (VIT D DEFICIENCY, FRACTURES): Vit D, 25-Hydroxy: 29.9 ng/mL — ABNORMAL LOW (ref 30.0–100.0)

## 2016-02-20 LAB — TSH: TSH: 4.83 u[IU]/mL — ABNORMAL HIGH (ref 0.450–4.500)

## 2016-03-01 ENCOUNTER — Telehealth: Payer: Self-pay | Admitting: Family Medicine

## 2016-03-01 NOTE — Telephone Encounter (Signed)
Results were reviewed and a copy was mailed out to the patient along with patient education print out.

## 2016-03-01 NOTE — Telephone Encounter (Signed)
Pt would like a call back about her lab results and needs someone advise on how she can get back on track to get her levels where they should be.

## 2016-04-22 ENCOUNTER — Encounter: Payer: Self-pay | Admitting: Gastroenterology

## 2016-05-13 ENCOUNTER — Encounter: Payer: Self-pay | Admitting: Gastroenterology

## 2016-05-13 ENCOUNTER — Encounter: Payer: Self-pay | Admitting: Family Medicine

## 2016-05-14 ENCOUNTER — Other Ambulatory Visit: Payer: Self-pay | Admitting: Family Medicine

## 2016-05-18 ENCOUNTER — Other Ambulatory Visit: Payer: Self-pay

## 2016-05-18 DIAGNOSIS — K50919 Crohn's disease, unspecified, with unspecified complications: Secondary | ICD-10-CM

## 2016-05-18 MED ORDER — AZATHIOPRINE 50 MG PO TABS: 50.0000 mg | ORAL_TABLET | Freq: Every day | ORAL | 3 refills | Status: DC

## 2016-05-31 ENCOUNTER — Other Ambulatory Visit: Payer: Self-pay

## 2016-05-31 ENCOUNTER — Telehealth: Payer: Self-pay | Admitting: Gastroenterology

## 2016-05-31 DIAGNOSIS — K5090 Crohn's disease, unspecified, without complications: Secondary | ICD-10-CM

## 2016-05-31 MED ORDER — PREDNISONE 20 MG PO TABS: ORAL_TABLET | ORAL | 0 refills | Status: DC

## 2016-05-31 NOTE — Telephone Encounter (Signed)
Spoke with pt and she is taking her Imuran 50mg  daily but started having abdominal pain and bloody stools. No fever noted. Should we get a CRP? Send prednisone? Please advise.

## 2016-05-31 NOTE — Telephone Encounter (Signed)
Yes. Send off a CRP and prednisone 40mg  a day for 2 weeks and then decrease by 10mg  every 2 weeks.

## 2016-05-31 NOTE — Telephone Encounter (Signed)
Pt notified of CRP lab test and rx for prednisone sent to pt's pharmacy.

## 2016-05-31 NOTE — Telephone Encounter (Signed)
Patient having a Crohn's flare up and wondering what she can take.

## 2016-06-01 ENCOUNTER — Other Ambulatory Visit: Payer: Self-pay

## 2016-06-01 DIAGNOSIS — K50011 Crohn's disease of small intestine with rectal bleeding: Secondary | ICD-10-CM

## 2016-06-01 LAB — C-REACTIVE PROTEIN: CRP: 15.1 mg/L — ABNORMAL HIGH (ref 0.0–4.9)

## 2016-06-01 NOTE — Telephone Encounter (Signed)
Pt notified of active flare and to increase her Imuran to 100mg  daily. Lab order has been done for pt to repeat CBC and LFT's in 2 weeks.

## 2016-06-01 NOTE — Telephone Encounter (Signed)
-----   Message from Lucilla Lame, MD sent at 06/01/2016  7:51 AM EDT ----- Let the patient know that her C-reactive protein was high consistent with this being a flare. Findings the patient is on 50 mg of Imuran and if so go up to 100. If we do go up then please check her CBC and liver functions in 2 weeks.

## 2016-06-12 LAB — CBC WITH DIFFERENTIAL/PLATELET
Basophils Absolute: 0 10*3/uL (ref 0.0–0.2)
Basos: 0 %
EOS (ABSOLUTE): 0 10*3/uL (ref 0.0–0.4)
Eos: 0 %
Hematocrit: 36.7 % (ref 34.0–46.6)
Hemoglobin: 12.4 g/dL (ref 11.1–15.9)
Immature Grans (Abs): 0.1 10*3/uL (ref 0.0–0.1)
Immature Granulocytes: 1 %
Lymphocytes Absolute: 1.6 10*3/uL (ref 0.7–3.1)
Lymphs: 14 %
MCH: 29.2 pg (ref 26.6–33.0)
MCHC: 33.8 g/dL (ref 31.5–35.7)
MCV: 86 fL (ref 79–97)
Monocytes Absolute: 0.6 10*3/uL (ref 0.1–0.9)
Monocytes: 5 %
Neutrophils Absolute: 9.5 10*3/uL — ABNORMAL HIGH (ref 1.4–7.0)
Neutrophils: 80 %
Platelets: 226 10*3/uL (ref 150–379)
RBC: 4.25 x10E6/uL (ref 3.77–5.28)
RDW: 15 % (ref 12.3–15.4)
WBC: 11.8 10*3/uL — ABNORMAL HIGH (ref 3.4–10.8)

## 2016-06-12 LAB — HEPATIC FUNCTION PANEL
ALT: 13 IU/L (ref 0–32)
AST: 10 IU/L (ref 0–40)
Albumin: 3.9 g/dL (ref 3.6–4.8)
Alkaline Phosphatase: 77 IU/L (ref 39–117)
Bilirubin Total: 0.5 mg/dL (ref 0.0–1.2)
Bilirubin, Direct: 0.16 mg/dL (ref 0.00–0.40)
Total Protein: 6.5 g/dL (ref 6.0–8.5)

## 2016-06-15 ENCOUNTER — Encounter: Payer: Self-pay | Admitting: Family Medicine

## 2016-06-15 ENCOUNTER — Telehealth: Payer: Self-pay

## 2016-06-15 ENCOUNTER — Ambulatory Visit (INDEPENDENT_AMBULATORY_CARE_PROVIDER_SITE_OTHER): Payer: PPO | Admitting: Family Medicine

## 2016-06-15 VITALS — BP 122/42 | HR 91 | Temp 99.1°F | Wt 137.6 lb

## 2016-06-15 DIAGNOSIS — M549 Dorsalgia, unspecified: Secondary | ICD-10-CM

## 2016-06-15 DIAGNOSIS — D899 Disorder involving the immune mechanism, unspecified: Secondary | ICD-10-CM | POA: Diagnosis not present

## 2016-06-15 DIAGNOSIS — Z0000 Encounter for general adult medical examination without abnormal findings: Secondary | ICD-10-CM

## 2016-06-15 DIAGNOSIS — M5430 Sciatica, unspecified side: Secondary | ICD-10-CM | POA: Diagnosis not present

## 2016-06-15 DIAGNOSIS — Z23 Encounter for immunization: Secondary | ICD-10-CM

## 2016-06-15 DIAGNOSIS — E038 Other specified hypothyroidism: Secondary | ICD-10-CM

## 2016-06-15 DIAGNOSIS — G8929 Other chronic pain: Secondary | ICD-10-CM

## 2016-06-15 DIAGNOSIS — D849 Immunodeficiency, unspecified: Secondary | ICD-10-CM

## 2016-06-15 DIAGNOSIS — Z853 Personal history of malignant neoplasm of breast: Secondary | ICD-10-CM | POA: Diagnosis not present

## 2016-06-15 DIAGNOSIS — Z08 Encounter for follow-up examination after completed treatment for malignant neoplasm: Secondary | ICD-10-CM | POA: Diagnosis not present

## 2016-06-15 MED ORDER — GABAPENTIN 100 MG PO CAPS: 100.0000 mg | ORAL_CAPSULE | Freq: Every day | ORAL | 0 refills | Status: DC

## 2016-06-15 NOTE — Progress Notes (Signed)
Name: Carolyn Campbell   MRN: [id number]    DOB: 04/29/1956   Date:06/15/2016       Progress Note  Subjective  Chief Complaint  Chief Complaint  Patient presents with  . Annual Exam    HPI  Functional ability/safety issues: She is on disability secondary to CVA with left side hemiparesis, she still has some dysarthria and mild cognitive dysfunction -she is able to do ADL Hearing issues: Addressed  Activities of daily living: Discussed - she does it slowly but able to function Home safety issues: No Issues  End Of Life Planning: Offered verbal information regarding advanced directives, healthcare power of attorney.  Preventative care, Health maintenance, Preventative health measures discussed.  Preventative screenings discussed today: lab work, colonoscopy,  mammogram, DEXA.  Low Dose CT Chest recommended if Age 50-80 years, 30 pack-year currently smoking OR have quit w/in 15years. Never smoked   Lifestyle risk factor issued reviewed: Diet, exercise, weight management, advised patient smoking is not healthy, nutrition/diet.  Preventative health measures discussed (5-10 year plan).  Reviewed and recommended vaccinations: - Pneumovax  - Prevnar  - Annual Influenza - Zostavax -can;t afford it  - Tdap   Depression screening: Done Fall risk screening: Done Discuss ADLs/IADLs: Done  Current medical providers: See HPI  Other health risk factors identified this visit: No other issues Cognitive impairment issues: None identified  All above discussed with patient. Appropriate education, counseling and referral will be made based upon the above.   Hypothyroidism: last TSH was elevated and is due for repeat, she has gained some weight and has been feeling more tired lately, we will recheck labs, she did not want to change the dose of levothyroxine last time  Chron's flare: she has noticed that she has been more tired, seeing Dr. Durwin Reges, on Imuran and currently prednisone taper    Chronic back pain with radiculitis: out of gabapentin, but states pain is so severe at times and needs to go back on mediation to control symptoms. She denies side effects of medication. Pain is described as hip pain and goes down her leg.   Patient Active Problem List   Diagnosis Date Noted  . Atherosclerosis of aorta ([place]) 12/19/2015  . Vitreous degeneration 05/21/2015  . Bad memory 05/21/2015  . Chronic ulcerative proctitis ([place]) 05/21/2015  . Glaucoma associated with chamber angle anomalies 05/21/2015  . Dermatitis, eczematoid 05/21/2015  . CD (Crohn's disease) ([place]) 05/21/2015  . H/O malignant neoplasm of breast 05/21/2015  . Crohn's disease of both small and large intestine with rectal bleeding ([place]) 12/04/2014  . Solitary pulmonary nodule 11/07/2012  . 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 Cognitive deficits as late effect of cerebrovascular disease 01/20/2010  . CVA, old, hemiparesis ([place]) 04/28/2009  . Adult hypothyroidism 06/12/2008  . Acid reflux 05/23/2007  . Hypercholesterolemia without hypertriglyceridemia 01/18/2007  . Benign essential HTN 01/18/2007    Past Surgical History:  Procedure Laterality Date  . BREAST LUMPECTOMY Left 09/20/2004   positive  . BREAST SURGERY Left    malignant biopsy  . COLONOSCOPY  2015  . ESOPHAGOGASTRODUODENOSCOPY  2015  . FINGER SURGERY     Right small finger  . [place] STUDY  2015  . TUBAL LIGATION      Family History  Problem Relation Age of Onset  . Heart disease Mother   . Heart attack Mother   . Breast cancer Sister   . Alcohol abuse Father   . Cerebrovascular Accident Father   .  Hypertension    . Diabetes    . Breast cancer Sister 14    Social History   Social History  . Marital status: Married    Spouse name: N/A  . Number of children: N/A  . Years of education: N/A   Occupational History  . Not on file.   Social History Main Topics  . Smoking status: Never Smoker  .  Smokeless tobacco: Never Used  . Alcohol use No  . Drug use: No  . Sexual activity: Yes    Partners: Male   Other Topics Concern  . Not on file   Social History Narrative   Married. One son and one daughter. She is disabled after she had breast cancer and strokes. 2 caffeinated beverages daily.     Current Outpatient Prescriptions:  .  alendronate (FOSAMAX) 70 MG tablet, Take 1 tablet (70 mg total) by mouth once a week. Take with a full glass of water on an empty stomach., Disp: 12 tablet, Rfl: 1 .  amLODipine-benazepril (LOTREL) 10-20 MG capsule, Take 1 capsule by mouth daily., Disp: 90 capsule, Rfl: 1 .  atorvastatin (LIPITOR) 40 MG tablet, Take 1 tablet (40 mg total) by mouth daily., Disp: 90 tablet, Rfl: 1 .  azaTHIOprine (IMURAN) 50 MG tablet, Take 1 tablet (50 mg total) by mouth daily., Disp: 90 tablet, Rfl: 3 .  gabapentin (NEURONTIN) 100 MG capsule, Take 1-3 capsules (100-300 mg total) by mouth at bedtime., Disp: 270 capsule, Rfl: 1 .  latanoprost (XALATAN) 0.005 % ophthalmic solution, , Disp: , Rfl:  .  levothyroxine (SYNTHROID, LEVOTHROID) 50 MCG tablet, Take 1 tablet (50 mcg total) by mouth daily., Disp: 90 tablet, Rfl: 1 .  predniSONE (DELTASONE) 20 MG tablet, Take 40mg  daily x 2 weeks, 30mg  daily x 2 weeks, 20mg  daily x 2 weeks, then 10mg  daily x 2 weeks, Disp: 84 tablet, Rfl: 0 .  sertraline (ZOLOFT) 100 MG tablet, Take 1 tablet (100 mg total) by mouth daily., Disp: 90 tablet, Rfl: 1 .  valACYclovir (VALTREX) 1000 MG tablet, Take 1 tablet (1,000 mg total) by mouth 2 (two) times daily., Disp: 30 tablet, Rfl: 0  Allergies  Allergen Reactions  . Morphine And Related Palpitations     ROS  Constitutional: Negative for fever, positive for weight change.  Respiratory: Negative for cough and shortness of breath.   Cardiovascular: Negative for chest pain or palpitations.  Gastrointestinal: Negative for abdominal pain, no bowel changes.  Musculoskeletal: Positive  for gait  problem ( worse when tired ) or joint swelling.  Skin: Negative for rash.  Neurological: Negative for dizziness or headache.  No other specific complaints in a complete review of systems (except as listed in HPI above).  Objective  Vitals:   06/15/16 1132  BP: (!) 122/42  Pulse: 91  Temp: 99.1 F (37.3 C)  SpO2: 97%  Weight: 137 lb 9.6 oz (62.4 kg)    Body mass index is 26.87 kg/m.  Physical Exam  Constitutional: Patient appears well-developed and well-nourished. No distress.  HENT: Head: Normocephalic and atraumatic. Ears: B TMs ok, no erythema or effusion; Nose: Nose normal. Mouth/Throat: Oropharynx is clear and moist. No oropharyngeal exudate.  Eyes: Conjunctivae and EOM are normal. Pupils are equal, round, and reactive to light. No scleral icterus.  Neck: Normal range of motion. Neck supple. No JVD present. No thyromegaly present.  Cardiovascular: Normal rate, regular rhythm and normal heart sounds.  No murmur heard. No BLE edema. Pulmonary/Chest: Effort normal and breath sounds  normal. No respiratory distress. Abdominal: Soft. Bowel sounds are normal, no distension. There is no tenderness. no masses Breast: no lumps or masses, no nipple discharge or rashes FEMALE GENITALIA:  External genitalia normal External urethra normal Not done RECTAL: not done Musculoskeletal: Normal range of motion, no joint effusions. No gross deformities Neurological: he is alert and oriented to person, place, and time. No cranial nerve deficit. Coordination, balance she has dysarthria and weaker on left side when compared to right side Skin: Skin is warm and dry. No rash noted. No erythema.  Psychiatric: Patient has a normal mood and affect. behavior is normal. Judgment and thought content normal.  Recent Results (from the past 2160 hour(s))  C-reactive protein     Status: Abnormal   Collection Time: 05/31/16  3:57 PM  Result Value Ref Range   CRP 15.1 (H) 0.0 - 4.9 mg/L  Hepatic function  panel     Status: None   Collection Time: 06/11/16 10:01 AM  Result Value Ref Range   Total Protein 6.5 6.0 - 8.5 g/dL   Albumin 3.9 3.6 - 4.8 g/dL   Bilirubin Total 0.5 0.0 - 1.2 mg/dL   Bilirubin, Direct 0.16 0.00 - 0.40 mg/dL   Alkaline Phosphatase 77 39 - 117 IU/L   AST 10 0 - 40 IU/L   ALT 13 0 - 32 IU/L  CBC with Differential/Platelet     Status: Abnormal   Collection Time: 06/11/16 10:03 AM  Result Value Ref Range   WBC 11.8 (H) 3.4 - 10.8 x10E3/uL   RBC 4.25 3.77 - 5.28 x10E6/uL   Hemoglobin 12.4 11.1 - 15.9 g/dL   Hematocrit 36.7 34.0 - 46.6 %   MCV 86 79 - 97 fL   MCH 29.2 26.6 - 33.0 pg   MCHC 33.8 31.5 - 35.7 g/dL   RDW 15.0 12.3 - 15.4 %   Platelets 226 150 - 379 x10E3/uL   Neutrophils 80 %   Lymphs 14 %   Monocytes 5 %   Eos 0 %   Basos 0 %   Neutrophils Absolute 9.5 (H) 1.4 - 7.0 x10E3/uL   Lymphocytes Absolute 1.6 0.7 - 3.1 x10E3/uL   Monocytes Absolute 0.6 0.1 - 0.9 x10E3/uL   EOS (ABSOLUTE) 0.0 0.0 - 0.4 x10E3/uL   Basophils Absolute 0.0 0.0 - 0.2 x10E3/uL   Immature Granulocytes 1 %   Immature Grans (Abs) 0.1 0.0 - 0.1 x10E3/uL      PHQ2/9: Depression screen [facility] 2/9 06/15/2016 12/19/2015 07/07/2015  Decreased Interest 0 0 0  Down, Depressed, Hopeless 0 0 0  PHQ - 2 Score 0 0 0     Fall Risk: Fall Risk  06/15/2016 12/19/2015 07/07/2015 05/27/2015  Falls in the past year? No No No No      Functional Status Survey: Is the patient deaf or have difficulty hearing?: No Does the patient have difficulty seeing, even when wearing glasses/contacts?: No Does the patient have difficulty concentrating, remembering, or making decisions?: No Does the patient have difficulty walking or climbing stairs?: No Does the patient have difficulty dressing or bathing?: No Does the patient have difficulty doing errands alone such as visiting a doctor's office or shopping?: No    Assessment & Plan  1. Medicare annual wellness visit, subsequent  Discussed  importance of 150 minutes of physical activity weekly, eat two servings of fish weekly, eat one serving of tree nuts ( cashews, pistachios, pecans, almonds.Marland Kitchen) every other day, eat 6 servings of fruit/vegetables daily and drink plenty  of water and avoid sweet beverages.   2. Needs flu shot   - Flu Vaccine QUAD 36+ mos PF IM (Fluarix & Fluzone Quad PF) She is on prednisone but had already receive Flu shot before I saw her, explained she may not mount a good response to vaccine  3. Other specified hypothyroidism  - TSH  4. Encounter for follow-up surveillance of breast cancer  - MM Digital Diagnostic Bilat; Future   5. Immunosuppressed status (HCC)  Imdur and prednisone  6. Sciatic leg pain  - gabapentin (NEURONTIN) 100 MG capsule; Take 1-3 capsules (100-300 mg total) by mouth at bedtime.  Dispense: 270 capsule; Refill: 0  7. Chronic back pain  - gabapentin (NEURONTIN) 100 MG capsule; Take 1-3 capsules (100-300 mg total) by mouth at bedtime.  Dispense: 270 capsule; Refill: 0

## 2016-06-15 NOTE — Telephone Encounter (Signed)
Pt notified of results. Pt stated she was not having any infection type symptoms. Will monitor this and call me back if she develops any problems.

## 2016-06-15 NOTE — Addendum Note (Signed)
Addended by: Inda Coke on: 06/15/2016 04:55 PM   Modules accepted: Orders

## 2016-06-15 NOTE — Telephone Encounter (Signed)
-----   Message from Lucilla Lame, MD sent at 06/15/2016  7:21 AM EDT ----- Let the patient know the white cells are a bit high likely from the steriods. Make sure she is not having any symptoms of an ifection.

## 2016-06-20 LAB — TSH: TSH: 0.287 u[IU]/mL — ABNORMAL LOW (ref 0.450–4.500)

## 2016-06-20 LAB — SPECIMEN STATUS REPORT

## 2016-07-14 ENCOUNTER — Ambulatory Visit
Admission: RE | Admit: 2016-07-14 | Discharge: 2016-07-14 | Disposition: A | Discharge status: Home / Self Care | Payer: PPO | Source: Ambulatory Visit | Attending: Family Medicine | Admitting: Family Medicine

## 2016-07-14 ENCOUNTER — Other Ambulatory Visit: Payer: Self-pay | Admitting: Family Medicine

## 2016-07-14 DIAGNOSIS — Z0000 Encounter for general adult medical examination without abnormal findings: Secondary | ICD-10-CM

## 2016-07-14 DIAGNOSIS — Z1231 Encounter for screening mammogram for malignant neoplasm of breast: Secondary | ICD-10-CM | POA: Diagnosis present

## 2016-07-14 HISTORY — DX: Personal history of irradiation: Z92.3

## 2016-08-11 ENCOUNTER — Ambulatory Visit: Payer: PPO | Admitting: Family Medicine

## 2016-08-19 ENCOUNTER — Other Ambulatory Visit: Payer: Self-pay | Admitting: Family Medicine

## 2016-08-19 DIAGNOSIS — M810 Age-related osteoporosis without current pathological fracture: Secondary | ICD-10-CM

## 2016-08-19 DIAGNOSIS — I10 Essential (primary) hypertension: Secondary | ICD-10-CM

## 2016-08-19 DIAGNOSIS — E038 Other specified hypothyroidism: Secondary | ICD-10-CM

## 2016-08-19 NOTE — Telephone Encounter (Signed)
Patient requesting refill of Fosamax, Lotrel, and Levothyroxine to [facility].

## 2016-10-18 ENCOUNTER — Encounter: Payer: Self-pay | Admitting: Family Medicine

## 2016-10-18 ENCOUNTER — Ambulatory Visit (INDEPENDENT_AMBULATORY_CARE_PROVIDER_SITE_OTHER): Payer: Medicare Other | Admitting: Family Medicine

## 2016-10-18 VITALS — BP 130/70 | HR 108 | Temp 98.7°F | Resp 16 | Ht 60.0 in | Wt 146.9 lb

## 2016-10-18 DIAGNOSIS — M5442 Lumbago with sciatica, left side: Secondary | ICD-10-CM

## 2016-10-18 DIAGNOSIS — D849 Immunodeficiency, unspecified: Secondary | ICD-10-CM

## 2016-10-18 DIAGNOSIS — I10 Essential (primary) hypertension: Secondary | ICD-10-CM

## 2016-10-18 DIAGNOSIS — M5430 Sciatica, unspecified side: Secondary | ICD-10-CM

## 2016-10-18 DIAGNOSIS — R1311 Dysphagia, oral phase: Secondary | ICD-10-CM | POA: Diagnosis not present

## 2016-10-18 DIAGNOSIS — F325 Major depressive disorder, single episode, in full remission: Secondary | ICD-10-CM | POA: Diagnosis not present

## 2016-10-18 DIAGNOSIS — E7800 Pure hypercholesterolemia, unspecified: Secondary | ICD-10-CM

## 2016-10-18 DIAGNOSIS — K50811 Crohn's disease of both small and large intestine with rectal bleeding: Secondary | ICD-10-CM | POA: Diagnosis not present

## 2016-10-18 DIAGNOSIS — D899 Disorder involving the immune mechanism, unspecified: Secondary | ICD-10-CM | POA: Diagnosis not present

## 2016-10-18 DIAGNOSIS — E038 Other specified hypothyroidism: Secondary | ICD-10-CM

## 2016-10-18 DIAGNOSIS — M8580 Other specified disorders of bone density and structure, unspecified site: Secondary | ICD-10-CM | POA: Diagnosis not present

## 2016-10-18 DIAGNOSIS — I69359 Hemiplegia and hemiparesis following cerebral infarction affecting unspecified side: Secondary | ICD-10-CM | POA: Diagnosis not present

## 2016-10-18 DIAGNOSIS — G8929 Other chronic pain: Secondary | ICD-10-CM

## 2016-10-18 DIAGNOSIS — I700 Atherosclerosis of aorta: Secondary | ICD-10-CM

## 2016-10-18 DIAGNOSIS — R946 Abnormal results of thyroid function studies: Secondary | ICD-10-CM

## 2016-10-18 LAB — TSH: TSH: 3 mIU/L

## 2016-10-18 MED ORDER — SERTRALINE HCL 100 MG PO TABS
100.0000 mg | ORAL_TABLET | Freq: Every day | ORAL | 1 refills | Status: DC
Start: 2016-10-18 — End: 2017-04-14

## 2016-10-18 MED ORDER — ALENDRONATE SODIUM 70 MG PO TABS: ORAL_TABLET | ORAL | 1 refills | Status: DC

## 2016-10-18 MED ORDER — AMLODIPINE BESY-BENAZEPRIL HCL 10-20 MG PO CAPS: 1.0000 | ORAL_CAPSULE | Freq: Every day | ORAL | 1 refills | Status: DC

## 2016-10-18 MED ORDER — GABAPENTIN 300 MG PO CAPS: 300.0000 mg | ORAL_CAPSULE | Freq: Every day | ORAL | 1 refills | Status: DC

## 2016-10-18 MED ORDER — ATORVASTATIN CALCIUM 40 MG PO TABS: 40.0000 mg | ORAL_TABLET | Freq: Every day | ORAL | 1 refills | Status: DC

## 2016-10-18 NOTE — Progress Notes (Addendum)
Name: Carolyn Campbell   MRN: [id number]    DOB: [birth year]   Date:10/18/2016       Progress Note  Subjective  Chief Complaint  Chief Complaint  Patient presents with  . Follow-up    patient is here for her 4 month f/u  . Back Pain    patient stated that she has been having some lower back pain  . Dysphagia    patient stated that she has some issues with swallowing on the right side  . Cough    patient stated that she has been coughing a lot at night so she has been using cough drops  . Medication Refill    patient needs printed Rxs due to new insurance and must be 90 day supply    HPI   Hypothyroidism: last TSH was suppressed t, she has gained some weight and has been feeling more tired lately, we will recheck labs, she did not want to change the dose of levothyroxine last time, but not sure on how she is taking it.   Chron's flare: she states she has been doing well since Summer of 2017, last colonoscopy done in 2015.   Chronic back pain with radiculitis: out of gabapentin, pain right now is 4/10 on lower back, described as dull aching and sometimes shoots down left leg  S/p CVA with left side weakness: she is on bp medication, statin therapy , but not on aspirin because of Chron's and risk of bleeding. She is right hand dominant.   Atherosclerosis of aorta: found on imaging studies, on statin but unable to take aspirin  Hyperlipidemia: taking statin and denies myalgia, chest pain or SOB  Dysphagia: she states she seems to choke with saliva when talking or screaming, but also has a dysphagia on right side of throat when she swallows, that has been present for months now, no weight loss. Episodes more frequent. She has a history of GERD but she is not taking medication for it, but she has some omeprazole at home and advised to try it  referral to GI  Major depression: she is on disability, but doing well at this time, no crying spells or lack of energy , she is taking Zoloft daily  and denies side effects  Patient Active Problem List   Diagnosis Date Noted  . Atherosclerosis of aorta ([place]) 12/19/2015  . Vitreous degeneration 05/21/2015  . Bad memory 05/21/2015  . Chronic ulcerative proctitis ([place]) 05/21/2015  . Glaucoma associated with chamber angle anomalies 05/21/2015  . Dermatitis, eczematoid 05/21/2015  . CD (Crohn's disease) ([place]) 05/21/2015  . H/O malignant neoplasm of breast 05/21/2015  . Crohn's disease of both small and large intestine with rectal bleeding ([place]) 12/04/2014  . Solitary pulmonary nodule 11/07/2012  . 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 Cognitive deficits as late effect of cerebrovascular disease 01/20/2010  . CVA, old, hemiparesis ([place]) 04/28/2009  . Adult hypothyroidism 06/12/2008  . Acid reflux 05/23/2007  . Hypercholesterolemia without hypertriglyceridemia 01/18/2007  . Benign essential HTN 01/18/2007    Past Surgical History:  Procedure Laterality Date  . BREAST BIOPSY Left 2006   POS  . BREAST LUMPECTOMY Left 09/20/2004   positive  . BREAST SURGERY Left    malignant biopsy  . COLONOSCOPY  2015  . ESOPHAGOGASTRODUODENOSCOPY  2015  . FINGER SURGERY     Right small finger  . [place] STUDY  2015  . TUBAL LIGATION      Family History  Problem Relation Age of Onset  . Heart disease Mother   . Heart attack Mother   . Breast cancer Sister   . Alcohol abuse Father   . Cerebrovascular Accident Father   . Hypertension    . Diabetes    . Breast cancer Sister 46    Social History   Social History  . Marital status: Married    Spouse name: N/A  . Number of children: N/A  . Years of education: N/A   Occupational History  . Not on file.   Social History Main Topics  . Smoking status: Never Smoker  . Smokeless tobacco: Never Used  . Alcohol use No  . Drug use: No  . Sexual activity: Yes    Partners: Male   Other Topics Concern  . Not on file   Social History Narrative    Married. One son and one daughter. She is disabled after she had breast cancer and strokes. 2 caffeinated beverages daily.     Current Outpatient Prescriptions:  .  alendronate (FOSAMAX) 70 MG tablet, Take 1 tablet by mouth once a week. Take with a full glass of water on an empty stomach., Disp: 12 tablet, Rfl: 1 .  amLODipine-benazepril (LOTREL) 10-20 MG capsule, Take 1 capsule by mouth daily., Disp: 90 capsule, Rfl: 1 .  atorvastatin (LIPITOR) 40 MG tablet, Take 1 tablet (40 mg total) by mouth daily., Disp: 90 tablet, Rfl: 1 .  azaTHIOprine (IMURAN) 50 MG tablet, Take 1 tablet (50 mg total) by mouth daily., Disp: 90 tablet, Rfl: 3 .  levothyroxine (SYNTHROID, LEVOTHROID) 50 MCG tablet, Take 1 tablet by mouth once daily, Disp: 90 tablet, Rfl: 0 .  Multiple Vitamin (MULTIVITAMIN) tablet, Take 1 tablet by mouth daily., Disp: , Rfl:  .  sertraline (ZOLOFT) 100 MG tablet, Take 1 tablet (100 mg total) by mouth daily., Disp: 90 tablet, Rfl: 1 .  gabapentin (NEURONTIN) 300 MG capsule, Take 1 capsule (300 mg total) by mouth at bedtime., Disp: 90 capsule, Rfl: 1 .  latanoprost (XALATAN) 0.005 % ophthalmic solution, , Disp: , Rfl:  .  valACYclovir (VALTREX) 1000 MG tablet, Take 1 tablet (1,000 mg total) by mouth 2 (two) times daily. (Patient not taking: Reported on 10/18/2016), Disp: 30 tablet, Rfl: 0  No Active Allergies   ROS  Constitutional: Negative for fever, positive for  weight change.  Respiratory: Positive  for dry cough at night, no shortness of breath.   Cardiovascular: Negative for chest pain or palpitations.  Gastrointestinal: Negative for abdominal pain, no bowel changes.  Musculoskeletal: Negative for gait problem or joint swelling.  Skin: Negative for rash.  Neurological: Negative for dizziness or headache.  No other specific complaints in a complete review of systems (except as listed in HPI above).  Objective  Vitals:   10/18/16 1322  BP: 130/70  Pulse: (!) 108  Resp: 16   Temp: 98.7 F (37.1 C)  TempSrc: Oral  SpO2: 97%  Weight: 146 lb 14.4 oz (66.6 kg)  Height: 5' (1.524 m)    Body mass index is 28.69 kg/m.  Physical Exam  Constitutional: Patient appears well-developed and well-nourished. Obese  No distress.  HEENT: head atraumatic, normocephalic, pupils equal and reactive to light,  neck supple, throat within normal limits, possible thyroid enlargement  Cardiovascular: Normal rate, regular rhythm and normal heart sounds.  No murmur heard. No BLE edema. Pulmonary/Chest: Effort normal and breath sounds normal. No respiratory distress. Abdominal: Soft.  There is no tenderness. Psychiatric: Patient has  a normal mood and affect. behavior is normal. Judgment and thought content normal. Neurological: negative straight leg raise, grip is weaker on left side , 4-5/5 normal on right side  PHQ2/9: Depression screen [facility] 2/9 06/15/2016 12/19/2015 07/07/2015  Decreased Interest 0 0 0  Down, Depressed, Hopeless 0 0 0  PHQ - 2 Score 0 0 0     Fall Risk: Fall Risk  06/15/2016 12/19/2015 07/07/2015 05/27/2015  Falls in the past year? No No No No     Assessment & Plan  1. Other specified hypothyroidism  - TSH  2. Immunosuppressed status ([place])  On Imuran   3. Hypercholesterolemia without hypertriglyceridemia  - atorvastatin (LIPITOR) 40 MG tablet; Take 1 tablet (40 mg total) by mouth daily.  Dispense: 90 tablet; Refill: 1  4. Atherosclerosis of aorta (HCC)  - atorvastatin (LIPITOR) 40 MG tablet; Take 1 tablet (40 mg total) by mouth daily.  Dispense: 90 tablet; Refill: 1  5. Crohn's disease of both small and large intestine with rectal bleeding ([place])  Continue follow up with GI - she has seen Dr. Durwin Reges in the past and needs to follow up with him Referral GI  6. Benign essential HTN  - amLODipine-benazepril (LOTREL) 10-20 MG capsule; Take 1 capsule by mouth daily.  Dispense: 90 capsule; Refill: 1  7. Major depression in remission (HCC)  -  sertraline (ZOLOFT) 100 MG tablet; Take 1 tablet (100 mg total) by mouth daily.  Dispense: 90 tablet; Refill: 1  8. Chronic left-sided low back pain with left-sided sciatica  With recent flare, however stopped taking gapentin and we will resume it today  9. CVA, old, hemiparesis ([place])  Still has weakness on left side  10. Chronic bilateral low back pain with left-sided sciatica  - gabapentin (NEURONTIN) 300 MG capsule; Take 1 capsule (300 mg total) by mouth at bedtime.  Dispense: 90 capsule; Refill: 1  11. Sciatic leg pain  - gabapentin (NEURONTIN) 300 MG capsule; Take 1 capsule (300 mg total) by mouth at bedtime.  Dispense: 90 capsule; Refill: 1  12. Oral phase dysphagia  - Ambulatory referral to GI - Dr. Durwin Reges  -Ranitidine 150 mg qhs #90   13. Osteopenia, unspecified location  - DG Bone Density; Future  14. Abnormal thyroid exam  - US Soft Tissue Head/Neck; Future

## 2016-10-18 NOTE — Addendum Note (Signed)
Addended by: Steele Sizer F on: 10/18/2016 02:13 PM   Modules accepted: Orders

## 2016-10-19 ENCOUNTER — Other Ambulatory Visit: Payer: Self-pay | Admitting: Family Medicine

## 2016-10-19 DIAGNOSIS — E038 Other specified hypothyroidism: Secondary | ICD-10-CM

## 2016-10-19 MED ORDER — LEVOTHYROXINE SODIUM 50 MCG PO TABS: 50.0000 ug | ORAL_TABLET | Freq: Every day | ORAL | 1 refills | Status: DC

## 2016-10-27 ENCOUNTER — Emergency Department: Payer: Medicare Other

## 2016-10-27 ENCOUNTER — Encounter: Payer: Self-pay | Admitting: Emergency Medicine

## 2016-10-27 ENCOUNTER — Emergency Department
Admission: EM | Admit: 2016-10-27 | Discharge: 2016-10-27 | Disposition: A | Discharge status: Home / Self Care | Payer: Medicare Other | Attending: Student in an Organized Health Care Education/Training Program | Admitting: Student in an Organized Health Care Education/Training Program

## 2016-10-27 DIAGNOSIS — I10 Essential (primary) hypertension: Secondary | ICD-10-CM | POA: Insufficient documentation

## 2016-10-27 DIAGNOSIS — R079 Chest pain, unspecified: Secondary | ICD-10-CM | POA: Diagnosis not present

## 2016-10-27 DIAGNOSIS — Z853 Personal history of malignant neoplasm of breast: Secondary | ICD-10-CM | POA: Insufficient documentation

## 2016-10-27 DIAGNOSIS — E039 Hypothyroidism, unspecified: Secondary | ICD-10-CM | POA: Insufficient documentation

## 2016-10-27 DIAGNOSIS — J45909 Unspecified asthma, uncomplicated: Secondary | ICD-10-CM | POA: Diagnosis not present

## 2016-10-27 DIAGNOSIS — Z79899 Other long term (current) drug therapy: Secondary | ICD-10-CM | POA: Diagnosis not present

## 2016-10-27 LAB — CBC WITH DIFFERENTIAL/PLATELET
Basophils Absolute: 0 10*3/uL (ref 0–0.1)
Basophils Relative: 1 %
Eosinophils Absolute: 0 10*3/uL (ref 0–0.7)
Eosinophils Relative: 1 %
HCT: 37.9 % (ref 35.0–47.0)
Hemoglobin: 12.9 g/dL (ref 12.0–16.0)
Lymphocytes Relative: 18 %
Lymphs Abs: 1 10*3/uL (ref 1.0–3.6)
MCH: 29.3 pg (ref 26.0–34.0)
MCHC: 34.1 g/dL (ref 32.0–36.0)
MCV: 85.9 fL (ref 80.0–100.0)
Monocytes Absolute: 0.5 10*3/uL (ref 0.2–0.9)
Monocytes Relative: 9 %
Neutro Abs: 3.9 10*3/uL (ref 1.4–6.5)
Neutrophils Relative %: 71 %
Platelets: 229 10*3/uL (ref 150–440)
RBC: 4.41 MIL/uL (ref 3.80–5.20)
RDW: 14.7 % — ABNORMAL HIGH (ref 11.5–14.5)
WBC: 5.4 10*3/uL (ref 3.6–11.0)

## 2016-10-27 LAB — TROPONIN I
Troponin I: <0.03 ng/mL (ref <0.03)
Troponin I: <0.03 ng/mL (ref <0.03)

## 2016-10-27 LAB — COMPREHENSIVE METABOLIC PANEL
ALT: 17 U/L (ref 14–54)
AST: 21 U/L (ref 15–41)
Albumin: 4.1 g/dL (ref 3.5–5.0)
Alkaline Phosphatase: 75 U/L (ref 38–126)
Anion gap: 7 (ref 5–15)
BUN: 17 mg/dL (ref 6–20)
CO2: 26 mmol/L (ref 22–32)
Calcium: 9.3 mg/dL (ref 8.9–10.3)
Chloride: 108 mmol/L (ref 101–111)
Creatinine, Ser: 0.97 mg/dL (ref 0.44–1.00)
GFR calc Af Amer: >60 mL/min (ref >60)
GFR calc non Af Amer: >60 mL/min (ref >60)
Glucose, Bld: 108 mg/dL — ABNORMAL HIGH (ref 65–99)
Potassium: 3.5 mmol/L (ref 3.5–5.1)
Sodium: 141 mmol/L (ref 135–145)
Total Bilirubin: 0.7 mg/dL (ref 0.3–1.2)
Total Protein: 7.4 g/dL (ref 6.5–8.1)

## 2016-10-27 LAB — FIBRIN DERIVATIVES D-DIMER (ARMC ONLY): Fibrin derivatives D-dimer (ARMC): 584 — ABNORMAL HIGH (ref 0–499)

## 2016-10-27 MED ORDER — AMLODIPINE BESYLATE 5 MG PO TABS
10.0000 mg | ORAL_TABLET | Freq: Once | ORAL | Status: AC
  Administered 2016-10-27: 10 mg via ORAL
  Filled 2016-10-27: qty 2

## 2016-10-27 MED ORDER — ALBUTEROL SULFATE HFA 108 (90 BASE) MCG/ACT IN AERS: 2.0000 | INHALATION_SPRAY | Freq: Four times a day (QID) | RESPIRATORY_TRACT | 2 refills | Status: DC | PRN

## 2016-10-27 MED ORDER — IOPAMIDOL (ISOVUE-370) INJECTION 76%
75.0000 mL | Freq: Once | INTRAVENOUS | Status: AC | PRN
  Administered 2016-10-27: 75 mL via INTRAVENOUS

## 2016-10-27 MED ORDER — TRAMADOL HCL 50 MG PO TABS: 50.0000 mg | ORAL_TABLET | Freq: Four times a day (QID) | ORAL | 0 refills | Status: DC | PRN

## 2016-10-27 MED ORDER — SODIUM CHLORIDE 0.9 % IV BOLUS (SEPSIS)
500.0000 mL | Freq: Once | INTRAVENOUS | Status: AC
  Administered 2016-10-27: 500 mL via INTRAVENOUS

## 2016-10-27 MED ORDER — IPRATROPIUM-ALBUTEROL 0.5-2.5 (3) MG/3ML IN SOLN
3.0000 mL | Freq: Once | RESPIRATORY_TRACT | Status: AC
  Administered 2016-10-27: 3 mL via RESPIRATORY_TRACT
  Filled 2016-10-27: qty 3

## 2016-10-27 NOTE — ED Notes (Signed)
Pt left without signing. Pt was given paperwork and papers were explained. Pt was waiting for MD and left before RN could come back to the room. Pt in NAD. PT left ED at 1540

## 2016-10-27 NOTE — ED Provider Notes (Signed)
[facility] Emergency Department Provider Note    First MD Initiated Contact with Patient 10/27/16 [id number]     (approximate)  I have reviewed the triage vital signs and the nursing notes.   HISTORY  Chief Complaint Chest Pain    HPI Carolyn Campbell is a 61 y.o. female with a history of asthma and breast cancer presents with several days of left-sided chest pain radiating down her left arm. States it initially started over 2 weeks ago but became worse over the weekend. Denies any pain with taking a deep breath. States the pain has been constant. No cough. No shortness of breath. Denies any pain radiating to her right shoulder or jaw. There is no associated nausea, vomiting or diarrhea. No measured fevers. States she also has right ear pain and a sore throat.   Past Medical History:  Diagnosis Date  . Allergic rhinitis   . Asthma   . Breast cancer ([place]) 2006   LT LUMPECTOMY  . Cognitive deficits as late effect of cerebrovascular disease   . Crohn's disease of both small and large intestine with rectal bleeding ([place]) 12/04/2014  . 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 Esophageal reflux   . Glaucoma    vitreous degeneration  . Hyperlipidemia   . Hypertension   . Hypothyroidism   . Occlusion, cerebral artery    NOS w/infarction  . Osteoporosis   . Personal history of radiation therapy 2006   BREAST CA   Family History  Problem Relation Age of Onset  . Heart disease Mother   . Heart attack Mother   . Breast cancer Sister   . Alcohol abuse Father   . Cerebrovascular Accident Father   . Hypertension    . Diabetes    . Breast cancer Sister 99   Past Surgical History:  Procedure Laterality Date  . BREAST BIOPSY Left 2006   POS  . BREAST LUMPECTOMY Left 09/20/2004   positive  . BREAST SURGERY Left    malignant biopsy  . COLONOSCOPY  2015  . ESOPHAGOGASTRODUODENOSCOPY  2015  . FINGER SURGERY     Right small finger  . [place] STUDY  2015  . TUBAL LIGATION     Patient Active Problem List   Diagnosis Date Noted  . Osteopenia 10/18/2016  . Atherosclerosis of aorta ([place]) 12/19/2015  . Vitreous degeneration 05/21/2015  . Bad memory 05/21/2015  . Chronic ulcerative proctitis ([place]) 05/21/2015  . Glaucoma associated with chamber angle anomalies 05/21/2015  . Dermatitis, eczematoid 05/21/2015  . CD (Crohn's disease) ([place]) 05/21/2015  . H/O malignant neoplasm of breast 05/21/2015  . Crohn's disease of both small and large intestine with rectal bleeding ([place]) 12/04/2014  . Solitary pulmonary nodule 11/07/2012  . 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 Cognitive deficits as late effect of cerebrovascular disease 01/20/2010  . CVA, old, hemiparesis ([place]) 04/28/2009  . Adult hypothyroidism 06/12/2008  . Acid reflux 05/23/2007  . Hypercholesterolemia without hypertriglyceridemia 01/18/2007  . Benign essential HTN 01/18/2007      Prior to Admission medications   Medication Sig Start Date End Date Taking? Authorizing Provider  alendronate (FOSAMAX) 70 MG tablet Take 1 tablet by mouth once a week. Take with a full glass of water on an empty stomach. 10/18/16   Steele Sizer, MD  amLODipine-benazepril (LOTREL) 10-20 MG capsule Take 1 capsule by mouth daily. 10/18/16   Steele Sizer, MD  atorvastatin (LIPITOR) 40 MG tablet  Take 1 tablet (40 mg total) by mouth daily. 10/18/16   Steele Sizer, MD  azaTHIOprine (IMURAN) 50 MG tablet Take 1 tablet (50 mg total) by mouth daily. 05/18/16   Lucilla Lame, MD  gabapentin (NEURONTIN) 300 MG capsule Take 1 capsule (300 mg total) by mouth at bedtime. 10/18/16   Steele Sizer, MD  latanoprost (XALATAN) 0.005 % ophthalmic solution  04/22/16   Historical Provider, MD  levothyroxine (SYNTHROID, LEVOTHROID) 50 MCG tablet Take 1 tablet (50 mcg total) by mouth daily. 10/19/16   Steele Sizer, MD  Multiple Vitamin (MULTIVITAMIN) tablet Take 1 tablet by mouth  daily.    Historical Provider, MD  sertraline (ZOLOFT) 100 MG tablet Take 1 tablet (100 mg total) by mouth daily. 10/18/16   Steele Sizer, MD  valACYclovir (VALTREX) 1000 MG tablet Take 1 tablet (1,000 mg total) by mouth 2 (two) times daily. Patient not taking: Reported on 10/18/2016 02/19/16   Steele Sizer, MD    Allergies Aspirin    Social History Social History  Substance Use Topics  . Smoking status: Never Smoker  . Smokeless tobacco: Never Used  . Alcohol use No    Review of Systems Patient denies headaches, rhinorrhea, blurry vision, numbness, shortness of breath, chest pain, edema, cough, abdominal pain, nausea, vomiting, diarrhea, dysuria, fevers, rashes or hallucinations unless otherwise stated above in HPI. ____________________________________________   PHYSICAL EXAM:  VITAL SIGNS: Vitals:   10/27/16 1043 10/27/16 1426  BP: (!) 161/77 (!) 151/77  Pulse: 91 88  Resp: 17 16  Temp:      Constitutional: Alert and oriented. Well appearing and in no acute distress. Eyes: Conjunctivae are normal. PERRL. EOMI. Head: Atraumatic. Ears: right TM erythematous with normal anatomy, no effusion, left TM normal Nose: No congestion/rhinnorhea. Mouth/Throat: Mucous membranes are moist.  Oropharynx non-erythematous. Neck: No stridor. Painless ROM. No cervical spine tenderness to palpation Hematological/Lymphatic/Immunilogical: No cervical lymphadenopathy. Cardiovascular: Normal rate, regular rhythm. Grossly normal heart sounds.  Good peripheral circulation. Respiratory: Normal respiratory effort.  No retractions. Lungs CTAB. Gastrointestinal: Soft and nontender. No distention. No abdominal bruits. No CVA tenderness. Musculoskeletal: No lower extremity tenderness nor edema.  No joint effusions. Neurologic:  Normal speech and language. No gross focal neurologic deficits are appreciated. No gait instability. Skin:  Skin is warm, dry and intact. No rash noted. Psychiatric: Mood  and affect are normal. Speech and behavior are normal.  ____________________________________________   LABS (all labs ordered are listed, but only abnormal results are displayed)  Results for orders placed or performed during the hospital encounter of 10/27/16 (from the past 24 hour(s))  CBC with Differential/Platelet     Status: Abnormal   Collection Time: 10/27/16 10:37 AM  Result Value Ref Range   WBC 5.4 3.6 - 11.0 K/uL   RBC 4.41 3.80 - 5.20 MIL/uL   Hemoglobin 12.9 12.0 - 16.0 g/dL   HCT 37.9 35.0 - 47.0 %   MCV 85.9 80.0 - 100.0 fL   MCH 29.3 26.0 - 34.0 pg   MCHC 34.1 32.0 - 36.0 g/dL   RDW 14.7 (H) 11.5 - 14.5 %   Platelets 229 150 - 440 K/uL   Neutrophils Relative % 71 %   Neutro Abs 3.9 1.4 - 6.5 K/uL   Lymphocytes Relative 18 %   Lymphs Abs 1.0 1.0 - 3.6 K/uL   Monocytes Relative 9 %   Monocytes Absolute 0.5 0.2 - 0.9 K/uL   Eosinophils Relative 1 %   Eosinophils Absolute 0.0 0 - 0.7 K/uL  Basophils Relative 1 %   Basophils Absolute 0.0 0 - 0.1 K/uL  Comprehensive metabolic panel     Status: Abnormal   Collection Time: 10/27/16 10:37 AM  Result Value Ref Range   Sodium 141 135 - 145 mmol/L   Potassium 3.5 3.5 - 5.1 mmol/L   Chloride 108 101 - 111 mmol/L   CO2 26 22 - 32 mmol/L   Glucose, Bld 108 (H) 65 - 99 mg/dL   BUN 17 6 - 20 mg/dL   Creatinine, Ser 0.97 0.44 - 1.00 mg/dL   Calcium 9.3 8.9 - 10.3 mg/dL   Total Protein 7.4 6.5 - 8.1 g/dL   Albumin 4.1 3.5 - 5.0 g/dL   AST 21 15 - 41 U/L   ALT 17 14 - 54 U/L   Alkaline Phosphatase 75 38 - 126 U/L   Total Bilirubin 0.7 0.3 - 1.2 mg/dL   GFR calc non Af Amer >60 >60 mL/min   GFR calc Af Amer >60 >60 mL/min   Anion gap 7 5 - 15  Troponin I     Status: None   Collection Time: 10/27/16 10:37 AM  Result Value Ref Range   Troponin I <0.03 <0.03 ng/mL  Fibrin derivatives D-Dimer ([facility] only)     Status: Abnormal   Collection Time: 10/27/16 10:37 AM  Result Value Ref Range   Fibrin derivatives D-dimer  ([facility]) 584 (H) 0 - 499  Troponin I     Status: None   Collection Time: 10/27/16  2:22 PM  Result Value Ref Range   Troponin I <0.03 <0.03 ng/mL   ____________________________________________  EKG My review and personal interpretation at Time: 9:31   Indication: chest pain  Rate: 110  Rhythm: sinus Axis: normal Other: non specific st changes, normal intervals, no STEMI ____________________________________________  RADIOLOGY  I personally reviewed all radiographic images ordered to evaluate for the above acute complaints and reviewed radiology reports and findings.  These findings were personally discussed with the patient.  Please see medical record for radiology report.  ____________________________________________   PROCEDURES  Procedure(s) performed:  Procedures    Critical Care performed: no ____________________________________________   INITIAL IMPRESSION / ASSESSMENT AND PLAN / ED COURSE  Pertinent labs & imaging results that were available during my care of the patient were reviewed by me and considered in my medical decision making (see chart for details).  DDX: ACS, pericarditis, esophagitis, boerhaaves, pe, dissection, pna, bronchitis, costochondritis   BLAKELIE UEHARA is a 61 y.o. who presents to the ED with chief complaint of left-sided chest pain as well as ear pain and sore throat as described above. Patient with low-grade temperature, tachycardia and hypertension the patient did not take her antihypertensive medications this morning. Initial EKG with nonspecific changes. No evidence of ST elevation MI. Presentation was consistent with ACS based on duration without any exertional features are typical symptoms of ACS but patient will require further evaluation with troponin and chest x-ray to evaluate for any evidence of underlying ischemia, pneumonia. Patient is tachycardic but not hypoxic. She is low risk [place] therefore will order a d-dimer to further risk  stratify for pulmonary embolism.  The patient will be placed on continuous pulse oximetry and telemetry for monitoring.  Laboratory evaluation will be sent to evaluate for the above complaints.     Clinical Course as of Oct 27 1644  Wed Oct 27, 2016  1115 DG Chest 2 View [PR]  1522 D-dimer was elevated therefore a based on risk factors  CT imaging of the chest was ordered to evaluate for pulmonary embolus him. CT imaging shows no evidence of acute abnormality.   Repeat troponin is negative. Patient hemodynamically stable. In no acute distress. Possible component of bronchitis. Will provide nebulas or treatment as well as symptomatically management and follow-up with cardiology.  [PR]    Clinical Course User Index [PR] Merlyn Lot, MD     ____________________________________________   FINAL CLINICAL IMPRESSION(S) / ED DIAGNOSES  Final diagnoses:  Chest pain, unspecified type      NEW MEDICATIONS STARTED DURING THIS VISIT:  New Prescriptions   No medications on file     Note:  This document was prepared using Dragon voice recognition software and may include unintentional dictation errors.    Merlyn Lot, MD 10/27/16 [phone]

## 2016-10-27 NOTE — ED Triage Notes (Signed)
Pt presents to ED with c/o L sided chest pain that radiates to her L arm. Pt states pain has been going on x 2 weeks, intermittently with worsening over the last 3 days. Pt is alert and oriented, noted to be tachycardic on the montior.

## 2016-11-04 ENCOUNTER — Telehealth: Payer: Self-pay

## 2016-11-04 NOTE — Telephone Encounter (Signed)
Patient was informed that she has been scheduled to have her ultrasound of the head and neck on 11/08/16 @ 1pm at the [facility]. She was told to arrive at 12:45pm to get registered. She expressed verbal agreement and said thanks.

## 2016-11-08 ENCOUNTER — Ambulatory Visit (INDEPENDENT_AMBULATORY_CARE_PROVIDER_SITE_OTHER): Payer: Medicare Other | Admitting: Gastroenterology

## 2016-11-08 ENCOUNTER — Ambulatory Visit
Admission: RE | Admit: 2016-11-08 | Discharge: 2016-11-08 | Disposition: A | Discharge status: Home / Self Care | Payer: Medicare Other | Source: Ambulatory Visit | Attending: Family Medicine | Admitting: Family Medicine

## 2016-11-08 ENCOUNTER — Encounter: Payer: Self-pay | Admitting: Gastroenterology

## 2016-11-08 VITALS — BP 144/69 | HR 84 | Temp 98.4°F | Ht 60.0 in | Wt 147.0 lb

## 2016-11-08 DIAGNOSIS — K50919 Crohn's disease, unspecified, with unspecified complications: Secondary | ICD-10-CM | POA: Diagnosis not present

## 2016-11-08 DIAGNOSIS — R946 Abnormal results of thyroid function studies: Secondary | ICD-10-CM | POA: Diagnosis not present

## 2016-11-08 DIAGNOSIS — E039 Hypothyroidism, unspecified: Secondary | ICD-10-CM | POA: Diagnosis not present

## 2016-11-08 MED ORDER — AZATHIOPRINE 50 MG PO TABS: 50.0000 mg | ORAL_TABLET | Freq: Every day | ORAL | 3 refills | Status: DC

## 2016-11-08 NOTE — Progress Notes (Signed)
Primary Care Physician: Loistine Chance, MD  Primary Gastroenterologist:  Dr. Lucilla Lame  Chief Complaint  Patient presents with  . Crohn's Disease    HPI: Carolyn Campbell is a 61 y.o. female here for follow-up of her Crohn's disease. The patient was on Remicade but states that she had problems with getting it covered by the insurance company and was not able to keep taking it.  She reports that she is doing well now on Imuran.  There is no report of any diarrhea black stools or bloody stools.  She does report that she has chronic bloating but this is unchanged from her baseline.  Current Outpatient Prescriptions  Medication Sig Dispense Refill  . albuterol (PROVENTIL HFA;VENTOLIN HFA) 108 (90 Base) MCG/ACT inhaler Inhale 2 puffs into the lungs every 6 (six) hours as needed for wheezing or shortness of breath. 1 Inhaler 2  . amLODipine-benazepril (LOTREL) 10-20 MG capsule Take 1 capsule by mouth daily. 90 capsule 1  . atorvastatin (LIPITOR) 40 MG tablet Take 1 tablet (40 mg total) by mouth daily. 90 tablet 1  . azaTHIOprine (IMURAN) 50 MG tablet Take 1 tablet (50 mg total) by mouth daily. 90 tablet 3  . gabapentin (NEURONTIN) 300 MG capsule Take 1 capsule (300 mg total) by mouth at bedtime. 90 capsule 1  . latanoprost (XALATAN) 0.005 % ophthalmic solution Place 1 drop into both eyes at bedtime.     Marland Kitchen levothyroxine (SYNTHROID, LEVOTHROID) 50 MCG tablet Take 1 tablet (50 mcg total) by mouth daily. 90 tablet 1  . Multiple Vitamin (MULTIVITAMIN) tablet Take 1 tablet by mouth daily.    . sertraline (ZOLOFT) 100 MG tablet Take 1 tablet (100 mg total) by mouth daily. 90 tablet 1  . valACYclovir (VALTREX) 1000 MG tablet Take 1 tablet (1,000 mg total) by mouth 2 (two) times daily. 30 tablet 0  . alendronate (FOSAMAX) 70 MG tablet Take 1 tablet by mouth once a week. Take with a full glass of water on an empty stomach. (Patient not taking: Reported on 11/08/2016) 12 tablet 1  . traMADol (ULTRAM) 50  MG tablet Take 1 tablet (50 mg total) by mouth every 6 (six) hours as needed. (Patient not taking: Reported on 11/08/2016) 6 tablet 0   No current facility-administered medications for this visit.     Allergies as of 11/08/2016 - Review Complete 11/08/2016  Allergen Reaction Noted  . Aspirin Other (See Comments) 10/18/2016    ROS:  General: Negative for anorexia, weight loss, fever, chills, fatigue, weakness. ENT: Negative for hoarseness, difficulty swallowing , nasal congestion. CV: Negative for chest pain, angina, palpitations, dyspnea on exertion, peripheral edema.  Respiratory: Negative for dyspnea at rest, dyspnea on exertion, cough, sputum, wheezing.  GI: See history of present illness. GU:  Negative for dysuria, hematuria, urinary incontinence, urinary frequency, nocturnal urination.  Endo: Negative for unusual weight change.    Physical Examination:   BP (!) 144/69   Pulse 84   Temp 98.4 F (36.9 C) (Oral)   Ht 5' (1.524 m)   Wt 147 lb (66.7 kg)   BMI 28.71 kg/m   General: Well-nourished, well-developed in no acute distress.  Eyes: No icterus. Conjunctivae pink. Mouth: Oropharyngeal mucosa moist and pink , no lesions erythema or exudate. Lungs: Clear to auscultation bilaterally. Non-labored. Heart: Regular rate and rhythm, no murmurs rubs or gallops.  Abdomen: Bowel sounds are normal, nontender, nondistended, no hepatosplenomegaly or masses, no abdominal bruits or hernia , no rebound or guarding.  Extremities: No lower extremity edema. No clubbing or deformities. Neuro: Alert and oriented x 3.  Grossly intact. Skin: Warm and dry, no jaundice.   Psych: Alert and cooperative, normal mood and affect.  Labs:    Imaging Studies: Dg Chest 2 View  Result Date: 10/27/2016 CLINICAL DATA:  Left-sided chest pain radiating to the left arm over the last 2 weeks, worse over the last 3 days, history of asthma and breast carcinoma EXAM: CHEST  2 VIEW COMPARISON:  Chest x-ray of  12/19/2015 FINDINGS: No active infiltrate or effusion is seen. Mediastinal and hilar contours are unremarkable. The heart is within normal limits in size. No bony abnormality is seen. IMPRESSION: No active cardiopulmonary disease. Electronically Signed   By: Ivar Drape M.D.   On: 10/27/2016 10:17   Ct Angio Chest Pe W And/or Wo Contrast  Result Date: 10/27/2016 CLINICAL DATA:  Left-sided chest pain radiating to the left arm EXAM: CT ANGIOGRAPHY CHEST WITH CONTRAST TECHNIQUE: Multidetector CT imaging of the chest was performed using the standard protocol during bolus administration of intravenous contrast. Multiplanar CT image reconstructions and MIPs were obtained to evaluate the vascular anatomy. CONTRAST:  Study 5 mL Isovue 370 COMPARISON:  None. FINDINGS: Cardiovascular: Satisfactory opacification of the pulmonary arteries to the segmental level. No evidence of pulmonary embolism. Normal heart size. No pericardial effusion. Normal caliber thoracic aorta. Mediastinum/Nodes: No enlarged mediastinal, hilar, or axillary lymph nodes. Thyroid gland, trachea, and esophagus demonstrate no significant findings. Lungs/Pleura: Lungs are clear. No pleural effusion or pneumothorax. Upper Abdomen: No acute abnormality. Musculoskeletal: No chest wall abnormality. No acute or significant osseous findings. Review of the MIP images confirms the above findings. IMPRESSION: 1. No evidence pulmonary embolus. 2. No acute cardiopulmonary disease. Electronically Signed   By: Kathreen Devoid   On: 10/27/2016 13:36   US Thyroid  Result Date: 11/08/2016 CLINICAL DATA:  Right sided dysphagia, hypothyroidism, asymmetry EXAM: THYROID ULTRASOUND TECHNIQUE: Ultrasound examination of the thyroid gland and adjacent soft tissues was performed. COMPARISON:  None. FINDINGS: Parenchymal Echotexture: Mildly heterogenous Isthmus: 0.2 cm thickness Right lobe: 3.8 x 1.1 x 1.1 cm Left lobe: 3.6 x 1.4 x 1.3 cm  _________________________________________________________ Estimated total number of nodules >/= 1 cm: 0 Number of spongiform nodules >/=  2 cm not described below (TR1): 0 Number of mixed cystic and solid nodules >/= 1.5 cm not described below (TR2): 0 No discrete nodules are seen within the thyroid gland. IMPRESSION: 1. Normal-sized thyroid.  No nodule. The above is in keeping with the ACR TI-RADS recommendations - J Am Coll Radiol 2017;14:587-595. Electronically Signed   By: Lucrezia Europe M.D.   On: 11/08/2016 15:01    Assessment and Plan:   Carolyn Campbell is a 61 y.o. y/o female comes in today with a history of Crohn's disease.  The patient also has some fullness on the right side of her throat that she is being worked up for possible thyroid problems.  The patient's Crohn's disease seems to be under control at the present time and she has no complaints.  The patient will have her Imuran refilled for a year.  The patient will also have her liver enzymes and CBC checked today. The patient has been explained the plan and agrees with it.    Lucilla Lame, MD. Marval Regal   Note: This dictation was prepared with Dragon dictation along with smaller phrase technology. Any transcriptional errors that result from this process are unintentional.

## 2016-11-09 ENCOUNTER — Telehealth: Payer: Self-pay | Admitting: Family Medicine

## 2016-11-09 LAB — CBC WITH DIFFERENTIAL/PLATELET
Basophils Absolute: 0 10*3/uL (ref 0.0–0.2)
Basos: 0 %
EOS (ABSOLUTE): 0.1 10*3/uL (ref 0.0–0.4)
Eos: 2 %
Hematocrit: 37.6 % (ref 34.0–46.6)
Hemoglobin: 12.7 g/dL (ref 11.1–15.9)
Immature Grans (Abs): 0 10*3/uL (ref 0.0–0.1)
Immature Granulocytes: 0 %
Lymphocytes Absolute: 1.8 10*3/uL (ref 0.7–3.1)
Lymphs: 35 %
MCH: 28.6 pg (ref 26.6–33.0)
MCHC: 33.8 g/dL (ref 31.5–35.7)
MCV: 85 fL (ref 79–97)
Monocytes Absolute: 0.4 10*3/uL (ref 0.1–0.9)
Monocytes: 8 %
Neutrophils Absolute: 2.7 10*3/uL (ref 1.4–7.0)
Neutrophils: 55 %
Platelets: 226 10*3/uL (ref 150–379)
RBC: 4.44 x10E6/uL (ref 3.77–5.28)
RDW: 14.6 % (ref 12.3–15.4)
WBC: 5 10*3/uL (ref 3.4–10.8)

## 2016-11-09 LAB — HEPATIC FUNCTION PANEL
ALT: 23 IU/L (ref 0–32)
AST: 24 IU/L (ref 0–40)
Albumin: 4.4 g/dL (ref 3.6–4.8)
Alkaline Phosphatase: 90 IU/L (ref 39–117)
Bilirubin Total: 0.4 mg/dL (ref 0.0–1.2)
Bilirubin, Direct: 0.13 mg/dL (ref 0.00–0.40)
Total Protein: 7.2 g/dL (ref 6.0–8.5)

## 2016-11-09 NOTE — Telephone Encounter (Signed)
Experiencing right ear pain.  Patient stated that she was seen in the ED on 10/27/16 for chest pain and the provider also checked her ear because patient complained of pain of the right ear.  Patient was given tramadol.  Patient stated that her ear is still bothering and wants to know what she can do. Please advise.

## 2016-11-10 ENCOUNTER — Encounter: Payer: Self-pay | Admitting: Family Medicine

## 2016-11-10 ENCOUNTER — Ambulatory Visit (INDEPENDENT_AMBULATORY_CARE_PROVIDER_SITE_OTHER): Payer: Medicare Other | Admitting: Family Medicine

## 2016-11-10 ENCOUNTER — Telehealth: Payer: Self-pay

## 2016-11-10 VITALS — BP 116/64 | HR 83 | Temp 98.2°F | Resp 16 | Ht 60.0 in | Wt 146.0 lb

## 2016-11-10 DIAGNOSIS — H9201 Otalgia, right ear: Secondary | ICD-10-CM | POA: Diagnosis not present

## 2016-11-10 DIAGNOSIS — M26621 Arthralgia of right temporomandibular joint: Secondary | ICD-10-CM

## 2016-11-10 MED ORDER — DICLOFENAC SODIUM 1 % TD GEL: 2.0000 g | Freq: Four times a day (QID) | TRANSDERMAL | 0 refills | Status: DC

## 2016-11-10 NOTE — Telephone Encounter (Signed)
-----   Message from Lucilla Lame, MD sent at 11/09/2016 10:27 AM EST ----- Let the patient know that all of her labs were normal

## 2016-11-10 NOTE — Progress Notes (Signed)
Name: Carolyn Campbell   MRN: [id number]    DOB: 22-Apr-1956   Date:11/10/2016       Progress Note  Subjective  Chief Complaint  Chief Complaint  Patient presents with  . Ear Pain    not getting better    HPI  Right ear pain: she states pain started suddenly. She state she developed left arm pain about one month ago and right otalgia about 2 weeks ago . She went to Urgent Care and was sent to [facility]. She was diagnosed with bronchitis and was given Ventolin. She states breathing is better, and no longer has left arm pain, but she continues to have right ear pain , worse when she applies pressure and radiates to right side of neck and head. Pain is described as aching, dull like. No fever, chills, change in appetite, nausea or vomiting. CT chest negative for PE, negative cardiac enzymes, normal CBC   Patient Active Problem List   Diagnosis Date Noted  . Osteopenia 10/18/2016  . Atherosclerosis of aorta ([place]) 12/19/2015  . Vitreous degeneration 05/21/2015  . Bad memory 05/21/2015  . Chronic ulcerative proctitis ([place]) 05/21/2015  . Glaucoma associated with chamber angle anomalies 05/21/2015  . Dermatitis, eczematoid 05/21/2015  . CD (Crohn's disease) ([place]) 05/21/2015  . H/O malignant neoplasm of breast 05/21/2015  . Crohn's disease of both small and large intestine with rectal bleeding ([place]) 12/04/2014  . Solitary pulmonary nodule 11/07/2012  . 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 Cognitive deficits as late effect of cerebrovascular disease 01/20/2010  . CVA, old, hemiparesis ([place]) 04/28/2009  . Adult hypothyroidism 06/12/2008  . Acid reflux 05/23/2007  . Hypercholesterolemia without hypertriglyceridemia 01/18/2007  . Benign essential HTN 01/18/2007    Past Surgical History:  Procedure Laterality Date  . BREAST BIOPSY Left 2006   POS  . BREAST LUMPECTOMY Left 09/20/2004   positive  . BREAST SURGERY Left    malignant biopsy  . COLONOSCOPY  2015  .  ESOPHAGOGASTRODUODENOSCOPY  2015  . FINGER SURGERY     Right small finger  . [place] STUDY  2015  . TUBAL LIGATION      Family History  Problem Relation Age of Onset  . Heart disease Mother   . Heart attack Mother   . Breast cancer Sister   . Alcohol abuse Father   . Cerebrovascular Accident Father   . Hypertension    . Diabetes    . Breast cancer Sister 46    Social History   Social History  . Marital status: Married    Spouse name: N/A  . Number of children: N/A  . Years of education: N/A   Occupational History  . Not on file.   Social History Main Topics  . Smoking status: Never Smoker  . Smokeless tobacco: Never Used  . Alcohol use No  . Drug use: No  . Sexual activity: Yes    Partners: Male   Other Topics Concern  . Not on file   Social History Narrative   Married. One son and one daughter. She is disabled after she had breast cancer and strokes. 2 caffeinated beverages daily.     Current Outpatient Prescriptions:  .  albuterol (PROVENTIL HFA;VENTOLIN HFA) 108 (90 Base) MCG/ACT inhaler, Inhale 2 puffs into the lungs every 6 (six) hours as needed for wheezing or shortness of breath., Disp: 1 Inhaler, Rfl: 2 .  amLODipine-benazepril (LOTREL) 10-20 MG capsule, Take 1 capsule by mouth daily., Disp: 90  capsule, Rfl: 1 .  atorvastatin (LIPITOR) 40 MG tablet, Take 1 tablet (40 mg total) by mouth daily., Disp: 90 tablet, Rfl: 1 .  azaTHIOprine (IMURAN) 50 MG tablet, Take 1 tablet (50 mg total) by mouth daily., Disp: 90 tablet, Rfl: 3 .  gabapentin (NEURONTIN) 300 MG capsule, Take 1 capsule (300 mg total) by mouth at bedtime., Disp: 90 capsule, Rfl: 1 .  latanoprost (XALATAN) 0.005 % ophthalmic solution, Place 1 drop into both eyes at bedtime. , Disp: , Rfl:  .  levothyroxine (SYNTHROID, LEVOTHROID) 50 MCG tablet, Take 1 tablet (50 mcg total) by mouth daily., Disp: 90 tablet, Rfl: 1 .  Multiple Vitamin (MULTIVITAMIN) tablet, Take 1 tablet by mouth daily.,  Disp: , Rfl:  .  sertraline (ZOLOFT) 100 MG tablet, Take 1 tablet (100 mg total) by mouth daily., Disp: 90 tablet, Rfl: 1 .  traMADol (ULTRAM) 50 MG tablet, Take 1 tablet (50 mg total) by mouth every 6 (six) hours as needed., Disp: 6 tablet, Rfl: 0 .  valACYclovir (VALTREX) 1000 MG tablet, Take 1 tablet (1,000 mg total) by mouth 2 (two) times daily., Disp: 30 tablet, Rfl: 0 .  alendronate (FOSAMAX) 70 MG tablet, Take 1 tablet by mouth once a week. Take with a full glass of water on an empty stomach. (Patient not taking: Reported on 11/10/2016), Disp: 12 tablet, Rfl: 1  Allergies  Allergen Reactions  . Aspirin Other (See Comments)    She has Chron's and advised to not take it by GI     ROS  Ten systems reviewed and is negative except as mentioned in HPI   Objective  Vitals:   11/10/16 1301  BP: 116/64  Pulse: 83  Resp: 16  Temp: 98.2 F (36.8 C)  TempSrc: Oral  SpO2: 95%  Weight: 146 lb (66.2 kg)  Height: 5' (1.524 m)    Body mass index is 28.51 kg/m.  Physical Exam  Constitutional: Patient appears well-developed and well-nourished. No distress.  HEENT: head atraumatic, normocephalic, pupils equal and reactive to light, ears normal TM, no pain during palpation of mastoid area, but very tender over TMJ with palpation of Right TMJ,  neck supple, throat within normal limits Cardiovascular: Normal rate, regular rhythm and normal heart sounds.  No murmur heard. No BLE edema. Pulmonary/Chest: Effort normal and breath sounds normal. No respiratory distress. Abdominal: Soft.  There is no tenderness. Psychiatric: Patient has a normal mood and affect. behavior is normal. Judgment and thought content normal.  Recent Results (from the past 2160 hour(s))  TSH     Status: None   Collection Time: 10/18/16  2:17 PM  Result Value Ref Range   TSH 3.00 mIU/L    Comment:   Reference Range   > or = 20 Years  0.40-4.50   Pregnancy Range First trimester  0.26-2.66 Second trimester  0.55-2.73 Third trimester  0.43-2.91     CBC with Differential/Platelet     Status: Abnormal   Collection Time: 10/27/16 10:37 AM  Result Value Ref Range   WBC 5.4 3.6 - 11.0 K/uL   RBC 4.41 3.80 - 5.20 MIL/uL   Hemoglobin 12.9 12.0 - 16.0 g/dL   HCT 37.9 35.0 - 47.0 %   MCV 85.9 80.0 - 100.0 fL   MCH 29.3 26.0 - 34.0 pg   MCHC 34.1 32.0 - 36.0 g/dL   RDW 14.7 (H) 11.5 - 14.5 %   Platelets 229 150 - 440 K/uL   Neutrophils Relative % 71 %  Neutro Abs 3.9 1.4 - 6.5 K/uL   Lymphocytes Relative 18 %   Lymphs Abs 1.0 1.0 - 3.6 K/uL   Monocytes Relative 9 %   Monocytes Absolute 0.5 0.2 - 0.9 K/uL   Eosinophils Relative 1 %   Eosinophils Absolute 0.0 0 - 0.7 K/uL   Basophils Relative 1 %   Basophils Absolute 0.0 0 - 0.1 K/uL  Comprehensive metabolic panel     Status: Abnormal   Collection Time: 10/27/16 10:37 AM  Result Value Ref Range   Sodium 141 135 - 145 mmol/L   Potassium 3.5 3.5 - 5.1 mmol/L   Chloride 108 101 - 111 mmol/L   CO2 26 22 - 32 mmol/L   Glucose, Bld 108 (H) 65 - 99 mg/dL   BUN 17 6 - 20 mg/dL   Creatinine, Ser 0.97 0.44 - 1.00 mg/dL   Calcium 9.3 8.9 - 10.3 mg/dL   Total Protein 7.4 6.5 - 8.1 g/dL   Albumin 4.1 3.5 - 5.0 g/dL   AST 21 15 - 41 U/L   ALT 17 14 - 54 U/L   Alkaline Phosphatase 75 38 - 126 U/L   Total Bilirubin 0.7 0.3 - 1.2 mg/dL   GFR calc non Af Amer >60 >60 mL/min   GFR calc Af Amer >60 >60 mL/min    Comment: (NOTE) The eGFR has been calculated using the CKD EPI equation. This calculation has not been validated in all clinical situations. eGFR's persistently <60 mL/min signify possible Chronic Kidney Disease.    Anion gap 7 5 - 15  Troponin I     Status: None   Collection Time: 10/27/16 10:37 AM  Result Value Ref Range   Troponin I <0.03 <0.03 ng/mL  Fibrin derivatives D-Dimer ([facility] only)     Status: Abnormal   Collection Time: 10/27/16 10:37 AM  Result Value Ref Range   Fibrin derivatives D-dimer ([facility]) 584 (H) 0 - 499     Comment: (NOTE) <> Exclusion of Venous Thromboembolism (VTE) - OUTPATIENT ONLY   (Emergency Department or [place])   0-499 ng/ml (FEU): With a low to intermediate pretest probability                      for VTE this test result excludes the diagnosis                      of VTE.   >499 ng/ml (FEU) : VTE not excluded; additional work up for VTE is                      required. <> Testing on Inpatients and Evaluation of Disseminated Intravascular   Coagulation ([facility]) Reference Range:   0-499 ng/ml (FEU)   Troponin I     Status: None   Collection Time: 10/27/16  2:22 PM  Result Value Ref Range   Troponin I <0.03 <0.03 ng/mL  CBC with Differential/Platelet     Status: None   Collection Time: 11/08/16  2:47 PM  Result Value Ref Range   WBC 5.0 3.4 - 10.8 x10E3/uL   RBC 4.44 3.77 - 5.28 x10E6/uL   Hemoglobin 12.7 11.1 - 15.9 g/dL   Hematocrit 37.6 34.0 - 46.6 %   MCV 85 79 - 97 fL   MCH 28.6 26.6 - 33.0 pg   MCHC 33.8 31.5 - 35.7 g/dL   RDW 14.6 12.3 - 15.4 %   Platelets 226 150 - 379 x10E3/uL  Neutrophils 55 Not Estab. %   Lymphs 35 Not Estab. %   Monocytes 8 Not Estab. %   Eos 2 Not Estab. %   Basos 0 Not Estab. %   Neutrophils Absolute 2.7 1.4 - 7.0 x10E3/uL   Lymphocytes Absolute 1.8 0.7 - 3.1 x10E3/uL   Monocytes Absolute 0.4 0.1 - 0.9 x10E3/uL   EOS (ABSOLUTE) 0.1 0.0 - 0.4 x10E3/uL   Basophils Absolute 0.0 0.0 - 0.2 x10E3/uL   Immature Granulocytes 0 Not Estab. %   Immature Grans (Abs) 0.0 0.0 - 0.1 x10E3/uL  Hepatic function panel     Status: None   Collection Time: 11/08/16  2:47 PM  Result Value Ref Range   Total Protein 7.2 6.0 - 8.5 g/dL   Albumin 4.4 3.6 - 4.8 g/dL   Bilirubin Total 0.4 0.0 - 1.2 mg/dL   Bilirubin, Direct 0.13 0.00 - 0.40 mg/dL   Alkaline Phosphatase 90 39 - 117 IU/L   AST 24 0 - 40 IU/L   ALT 23 0 - 32 IU/L     PHQ2/9: Depression screen [facility] 2/9 11/10/2016 06/15/2016 12/19/2015 07/07/2015  Decreased Interest 0 0 0 0  Down, Depressed,  Hopeless 0 0 0 0  PHQ - 2 Score 0 0 0 0     Fall Risk: Fall Risk  11/10/2016 06/15/2016 12/19/2015 07/07/2015 05/27/2015  Falls in the past year? _0     Functional Status Survey: Is the patient deaf or have difficulty hearing?: No Does the patient have difficulty seeing, even when wearing glasses/contacts?: No Does the patient have difficulty concentrating, remembering, or making decisions?: No Does the patient have difficulty walking or climbing stairs?: No Does the patient have difficulty dressing or bathing?: No Does the patient have difficulty doing errands alone such as visiting a doctor's office or shopping?: No    Assessment & Plan  1. Right ear pain  No signs of infection   2. Arthralgia of right temporomandibular joint  Likely the cause of her pain, painful during palpation of right TMJ also with abduction, but not when chewing. Advised follow up with her dentist She states she likes to chew gum, advised her to avoid chewing if possible - diclofenac sodium (VOLTAREN) 1 % GEL; Apply 2 g topically 4 (four) times daily.  Dispense: 100 g; Refill: 0

## 2016-11-10 NOTE — Telephone Encounter (Signed)
Left vm letting pt know lab was normal.  

## 2016-11-10 NOTE — Telephone Encounter (Signed)
Pt seen today

## 2016-11-11 ENCOUNTER — Other Ambulatory Visit: Payer: Self-pay | Admitting: Family Medicine

## 2016-11-11 MED ORDER — MELOXICAM 15 MG PO TABS: 15.0000 mg | ORAL_TABLET | Freq: Every day | ORAL | 0 refills | Status: DC

## 2016-12-16 ENCOUNTER — Other Ambulatory Visit: Payer: Self-pay | Admitting: Family Medicine

## 2016-12-16 NOTE — Telephone Encounter (Signed)
Patient requesting refill of Meloxicam to [facility].

## 2016-12-30 DIAGNOSIS — H401121 Primary open-angle glaucoma, left eye, mild stage: Secondary | ICD-10-CM | POA: Diagnosis not present

## 2016-12-30 DIAGNOSIS — H401112 Primary open-angle glaucoma, right eye, moderate stage: Secondary | ICD-10-CM | POA: Diagnosis not present

## 2016-12-30 DIAGNOSIS — H52223 Regular astigmatism, bilateral: Secondary | ICD-10-CM | POA: Diagnosis not present

## 2017-01-17 ENCOUNTER — Encounter: Payer: Self-pay | Admitting: Family Medicine

## 2017-01-17 ENCOUNTER — Ambulatory Visit (INDEPENDENT_AMBULATORY_CARE_PROVIDER_SITE_OTHER): Payer: Medicare Other | Admitting: Family Medicine

## 2017-01-17 VITALS — BP 126/64 | HR 90 | Temp 98.3°F | Resp 16 | Ht 60.0 in | Wt 145.9 lb

## 2017-01-17 DIAGNOSIS — K50811 Crohn's disease of both small and large intestine with rectal bleeding: Secondary | ICD-10-CM | POA: Diagnosis not present

## 2017-01-17 DIAGNOSIS — I69359 Hemiplegia and hemiparesis following cerebral infarction affecting unspecified side: Secondary | ICD-10-CM | POA: Diagnosis not present

## 2017-01-17 DIAGNOSIS — R1311 Dysphagia, oral phase: Secondary | ICD-10-CM

## 2017-01-17 DIAGNOSIS — E663 Overweight: Secondary | ICD-10-CM

## 2017-01-17 DIAGNOSIS — K219 Gastro-esophageal reflux disease without esophagitis: Secondary | ICD-10-CM | POA: Diagnosis not present

## 2017-01-17 DIAGNOSIS — E039 Hypothyroidism, unspecified: Secondary | ICD-10-CM

## 2017-01-17 DIAGNOSIS — I700 Atherosclerosis of aorta: Secondary | ICD-10-CM | POA: Diagnosis not present

## 2017-01-17 DIAGNOSIS — M8580 Other specified disorders of bone density and structure, unspecified site: Secondary | ICD-10-CM

## 2017-01-17 DIAGNOSIS — I10 Essential (primary) hypertension: Secondary | ICD-10-CM

## 2017-01-17 NOTE — Patient Instructions (Addendum)
Exercising to Lose Weight Exercising can help you to lose weight. In order to lose weight through exercise, you need to do vigorous-intensity exercise. You can tell that you are exercising with vigorous intensity if you are breathing very hard and fast and cannot hold a conversation while exercising. Moderate-intensity exercise helps to maintain your current weight. You can tell that you are exercising at a moderate level if you have a higher heart rate and faster breathing, but you are still able to hold a conversation. How often should I exercise? Choose an activity that you enjoy and set realistic goals. Your health care provider can help you to make an activity plan that works for you. Exercise regularly as directed by your health care provider. This may include:  Doing resistance training twice each week, such as:  Push-ups.  Sit-ups.  Lifting weights.  Using resistance bands.  Doing a given intensity of exercise for a given amount of time. Choose from these options:  150 minutes of moderate-intensity exercise every week.  75 minutes of vigorous-intensity exercise every week.  A mix of moderate-intensity and vigorous-intensity exercise every week. Children, pregnant women, people who are out of shape, people who are overweight, and older adults may need to consult a health care provider for individual recommendations. If you have any sort of medical condition, be sure to consult your health care provider before starting a new exercise program. What are some activities that can help me to lose weight?  Walking at a rate of at least 4.5 miles an hour.  Jogging or running at a rate of 5 miles per hour.  Biking at a rate of at least 10 miles per hour.  Lap swimming.  Roller-skating or in-line skating.  Cross-country skiing.  Vigorous competitive sports, such as football, basketball, and soccer.  Jumping rope.  Aerobic dancing. How can I be more active in my day-to-day  activities?  Use the stairs instead of the elevator.  Take a walk during your lunch break.  If you drive, park your car farther away from work or school.  If you take public transportation, get off one stop early and walk the rest of the way.  Make all of your phone calls while standing up and walking around.  Get up, stretch, and walk around every 30 minutes throughout the day. What guidelines should I follow while exercising?  Do not exercise so much that you hurt yourself, feel dizzy, or get very short of breath.  Consult your health care provider prior to starting a new exercise program.  Wear comfortable clothes and shoes with good support.  Drink plenty of water while you exercise to prevent dehydration or heat stroke. Body water is lost during exercise and must be replaced.  Work out until you breathe faster and your heart beats faster. This information is not intended to replace advice given to you by your health care provider. Make sure you discuss any questions you have with your health care provider. Document Released: 10/09/2010 Document Revised: 02/12/2016 Document Reviewed: 02/07/2014 Elsevier Interactive Patient Education  2017 [employer]  Obesity, Adult Obesity is having too much body fat. If you have a BMI of 30 or more, you are obese. BMI is a number that explains how much body fat you have. Obesity is often caused by taking in (consuming) more calories than your body uses. Obesity can cause serious health problems. Changing your lifestyle can help to treat obesity. Follow these instructions at home: Eating and drinking  Follow advice from your doctor about what to eat and drink. Your doctor may tell you to:  Cut down on (limit) fast foods, sweets, and processed snack foods.  Choose low-fat options. For example, choose low-fat milk instead of whole milk.  Eat 5 or more servings of fruits or vegetables every day.  Eat at home more often. This gives  you more control over what you eat.  Choose healthy foods when you eat out.  Learn what a healthy portion size is. A portion size is the amount of a certain food that is healthy for you to eat at one time. This is different for each person.  Keep low-fat snacks available.  Avoid sugary drinks. These include soda, fruit juice, iced tea that is sweetened with sugar, and flavored milk.  Eat a healthy breakfast.  Drink enough water to keep your pee (urine) clear or pale yellow.  Do not go without eating for long periods of time (do not fast).  Do not go on popular or trendy diets (fad diets). Physical Activity   Exercise often, as told by your doctor. Ask your doctor:  What types of exercise are safe for you.  How often you should exercise.  Warm up and stretch before being active.  Do slow stretching after being active (cool down).  Rest between times of being active. Lifestyle   Limit how much time you spend in front of your TV, computer, or video game system (be less sedentary).  Find ways to reward yourself that do not involve food.  Limit alcohol intake to no more than 1 drink a day for nonpregnant women and 2 drinks a day for men. One drink equals 12 oz of beer, 5 oz of wine, or 1 oz of hard liquor. General instructions   Keep a weight loss journal. This can help you keep track of:  The food that you eat.  The exercise that you do.  Take over-the-counter and prescription medicines only as told by your doctor.  Take vitamins and supplements only as told by your doctor.  Think about joining a support group. Your doctor may be able to help with this.  Keep all follow-up visits as told by your doctor. This is important. Contact a doctor if:  You cannot meet your weight loss goal after you have changed your diet and lifestyle for 6 weeks. This information is not intended to replace advice given to you by your health care provider. Make sure you discuss any  questions you have with your health care provider. Document Released: 11/29/2011 Document Revised: 02/12/2016 Document Reviewed: 06/25/2015 Elsevier Interactive Patient Education  2017 [employer].

## 2017-01-17 NOTE — Progress Notes (Signed)
Name: Carolyn Campbell   MRN: [id number]    DOB: 1956/06/12   Date:01/17/2017       Progress Note  Subjective  Chief Complaint  Chief Complaint  Patient presents with  . Medication Refill  . Hypothyroidism  . Depression    HPI   Hypothyroidism: last TSH was 3.00, she has recently started to work on losing weight. Denies weight gain, fatigue, dry skin, palpitations, or constipation.  Chron's: Last colonoscopy done in 2015 - she is due to repeat.  Had a flare 4-5 months ago, saw GI, and has been doing well since then.   Chronic back pain with radiculitis: Has been taking gabapentin as prescribed, pain right now is 0/10 on lower back, described as dull aching and sometimes shoots down left leg, but this has been less since beginning to exercise. Takes Tramadol very seldomly for pain.  S/p CVA with left side weakness: she is on bp medication, statin therapy , but not on aspirin because of Chron's and risk of bleeding. She is right hand dominant.   Atherosclerosis of aorta: found on imaging studies, on statin but unable to take aspirin  Hyperlipidemia: taking statin and denies myalgias, chest pain, or SOB  Dysphagia: she states she seems to choke with saliva when talking or yelling, but also has a dysphagia on right side of throat when she swallows, that has been present for several months now, no weight loss. Episodes more frequent. She has a history of GERD but she is not taking medication for it, but she did trial with omeprazole and it did not help. Saw her GI specialist regarding this issue and states there was never a resolution, she did not see the ENT after referral was placed - states never heard back.  Major depression: she is on disability, but doing well at this time, no crying spells or lack of energy, she is taking Zoloft daily and denies side effects.  Overweight: She notes this is the heaviest she has ever been and is motivated to lose about 10lbs. Pt began going to the  [facility] 3 times a week and has been doing the bike for 30 minutes, and then does different strengthening machines, and sometimes walk the track.  Osteopenia - Patient continues to take Fosamax as prescribed.   Patient Active Problem List   Diagnosis Date Noted  . Osteopenia 10/18/2016  . Atherosclerosis of aorta ([place]) 12/19/2015  . Vitreous degeneration 05/21/2015  . Bad memory 05/21/2015  . Chronic ulcerative proctitis ([place]) 05/21/2015  . Glaucoma associated with chamber angle anomalies 05/21/2015  . Dermatitis, eczematoid 05/21/2015  . CD (Crohn's disease) ([place]) 05/21/2015  . H/O malignant neoplasm of breast 05/21/2015  . Crohn's disease of both small and large intestine with rectal bleeding ([place]) 12/04/2014  . Solitary pulmonary nodule 11/07/2012  . 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 Cognitive deficits as late effect of cerebrovascular disease 01/20/2010  . CVA, old, hemiparesis ([place]) 04/28/2009  . Adult hypothyroidism 06/12/2008  . Acid reflux 05/23/2007  . Hypercholesterolemia without hypertriglyceridemia 01/18/2007  . Benign essential HTN 01/18/2007    Past Surgical History:  Procedure Laterality Date  . BREAST BIOPSY Left 2006   POS  . BREAST LUMPECTOMY Left 09/20/2004   positive  . BREAST SURGERY Left    malignant biopsy  . COLONOSCOPY  2015  . ESOPHAGOGASTRODUODENOSCOPY  2015  . FINGER SURGERY     Right small finger  . [place] STUDY  2015  . TUBAL  LIGATION      Family History  Problem Relation Age of Onset  . Heart disease Mother   . Heart attack Mother   . Breast cancer Sister   . Alcohol abuse Father   . Cerebrovascular Accident Father   . Hypertension    . Diabetes    . Breast cancer Sister 45    Social History   Social History  . Marital status: Married    Spouse name: N/A  . Number of children: N/A  . Years of education: N/A   Occupational History  . Not on file.   Social History Main Topics  . Smoking  status: Never Smoker  . Smokeless tobacco: Never Used  . Alcohol use No  . Drug use: No  . Sexual activity: Yes    Partners: Male   Other Topics Concern  . Not on file   Social History Narrative   Married. One son and one daughter. She is disabled after she had breast cancer and strokes. 2 caffeinated beverages daily.     Current Outpatient Prescriptions:  .  alendronate (FOSAMAX) 70 MG tablet, Take 1 tablet by mouth once a week. Take with a full glass of water on an empty stomach., Disp: 12 tablet, Rfl: 1 .  amLODipine-benazepril (LOTREL) 10-20 MG capsule, Take 1 capsule by mouth daily., Disp: 90 capsule, Rfl: 1 .  atorvastatin (LIPITOR) 40 MG tablet, Take 1 tablet (40 mg total) by mouth daily., Disp: 90 tablet, Rfl: 1 .  azaTHIOprine (IMURAN) 50 MG tablet, Take 1 tablet (50 mg total) by mouth daily., Disp: 90 tablet, Rfl: 3 .  diclofenac sodium (VOLTAREN) 1 % GEL, Apply 2 g topically 4 (four) times daily., Disp: 100 g, Rfl: 0 .  gabapentin (NEURONTIN) 300 MG capsule, Take 1 capsule (300 mg total) by mouth at bedtime., Disp: 90 capsule, Rfl: 1 .  latanoprost (XALATAN) 0.005 % ophthalmic solution, Place 1 drop into both eyes at bedtime. , Disp: , Rfl:  .  levothyroxine (SYNTHROID, LEVOTHROID) 50 MCG tablet, Take 1 tablet (50 mcg total) by mouth daily., Disp: 90 tablet, Rfl: 1 .  meloxicam (MOBIC) 15 MG tablet, Take 1 tablet (15 mg total) by mouth daily., Disp: 30 tablet, Rfl: 0 .  Multiple Vitamin (MULTIVITAMIN) tablet, Take 1 tablet by mouth daily., Disp: , Rfl:  .  sertraline (ZOLOFT) 100 MG tablet, Take 1 tablet (100 mg total) by mouth daily., Disp: 90 tablet, Rfl: 1 .  traMADol (ULTRAM) 50 MG tablet, Take 1 tablet (50 mg total) by mouth every 6 (six) hours as needed., Disp: 6 tablet, Rfl: 0 .  valACYclovir (VALTREX) 1000 MG tablet, Take 1 tablet (1,000 mg total) by mouth 2 (two) times daily., Disp: 30 tablet, Rfl: 0  Allergies  Allergen Reactions  . Aspirin Other (See Comments)     She has Chron's and advised to not take it by GI     ROS  Constitutional: Negative for fever or weight change.  Respiratory: Negative for cough and shortness of breath.   Cardiovascular: Negative for chest pain or palpitations.  Gastrointestinal: Negative for abdominal pain, no bowel changes.  Musculoskeletal: Negative for gait problem or joint swelling.  Skin: Negative for rash.  Neurological: Negative for dizziness or headache.  No other specific complaints in a complete review of systems (except as listed in HPI above).  Objective  Vitals:   01/17/17 1322  BP: 126/64  Pulse: 90  Resp: 16  Temp: 98.3 F (36.8 C)  TempSrc: Oral  SpO2: 95%  Weight: 145 lb 14.4 oz (66.2 kg)  Height: 5' (1.524 m)    Body mass index is 28.49 kg/m.  Physical Exam Constitutional: Patient appears well-developed and well-nourished. Overweight No distress.  HENT: Head: Normocephalic and atraumatic. Eyes: Conjunctivae and EOM are normal. Pupils are equal, round, and reactive to light. No scleral icterus.  Neck: Normal range of motion. Neck supple. No JVD present. No thyromegaly present.  Cardiovascular: Normal rate, regular rhythm and normal heart sounds.  No murmur heard. No BLE edema. Pulmonary/Chest: Effort normal and breath sounds normal. No respiratory distress. Musculoskeletal: Normal range of motion, no joint effusions. No gross deformities Neurological: he is alert and oriented to person, place, and time. No cranial nerve deficit. Coordination, balance, strength, speech and gait are normal.  Skin: Skin is warm and dry. No rash noted. No erythema.  Psychiatric: Patient has a normal mood and affect. behavior is normal. Judgment and thought content normal.   Recent Results (from the past 2160 hour(s))  CBC with Differential/Platelet     Status: Abnormal   Collection Time: 10/27/16 10:37 AM  Result Value Ref Range   WBC 5.4 3.6 - 11.0 K/uL   RBC 4.41 3.80 - 5.20 MIL/uL   Hemoglobin  12.9 12.0 - 16.0 g/dL   HCT 37.9 35.0 - 47.0 %   MCV 85.9 80.0 - 100.0 fL   MCH 29.3 26.0 - 34.0 pg   MCHC 34.1 32.0 - 36.0 g/dL   RDW 14.7 (H) 11.5 - 14.5 %   Platelets 229 150 - 440 K/uL   Neutrophils Relative % 71 %   Neutro Abs 3.9 1.4 - 6.5 K/uL   Lymphocytes Relative 18 %   Lymphs Abs 1.0 1.0 - 3.6 K/uL   Monocytes Relative 9 %   Monocytes Absolute 0.5 0.2 - 0.9 K/uL   Eosinophils Relative 1 %   Eosinophils Absolute 0.0 0 - 0.7 K/uL   Basophils Relative 1 %   Basophils Absolute 0.0 0 - 0.1 K/uL  Comprehensive metabolic panel     Status: Abnormal   Collection Time: 10/27/16 10:37 AM  Result Value Ref Range   Sodium 141 135 - 145 mmol/L   Potassium 3.5 3.5 - 5.1 mmol/L   Chloride 108 101 - 111 mmol/L   CO2 26 22 - 32 mmol/L   Glucose, Bld 108 (H) 65 - 99 mg/dL   BUN 17 6 - 20 mg/dL   Creatinine, Ser 0.97 0.44 - 1.00 mg/dL   Calcium 9.3 8.9 - 10.3 mg/dL   Total Protein 7.4 6.5 - 8.1 g/dL   Albumin 4.1 3.5 - 5.0 g/dL   AST 21 15 - 41 U/L   ALT 17 14 - 54 U/L   Alkaline Phosphatase 75 38 - 126 U/L   Total Bilirubin 0.7 0.3 - 1.2 mg/dL   GFR calc non Af Amer >60 >60 mL/min   GFR calc Af Amer >60 >60 mL/min    Comment: (NOTE) The eGFR has been calculated using the CKD EPI equation. This calculation has not been validated in all clinical situations. eGFR's persistently <60 mL/min signify possible Chronic Kidney Disease.    Anion gap 7 5 - 15  Troponin I     Status: None   Collection Time: 10/27/16 10:37 AM  Result Value Ref Range   Troponin I <0.03 <0.03 ng/mL  Fibrin derivatives D-Dimer ([facility] only)     Status: Abnormal   Collection Time: 10/27/16 10:37 AM  Result Value Ref Range  Fibrin derivatives D-dimer ([facility]) 584 (H) 0 - 499    Comment: (NOTE) <> Exclusion of Venous Thromboembolism (VTE) - OUTPATIENT ONLY   (Emergency Department or [place])   0-499 ng/ml (FEU): With a low to intermediate pretest probability                      for VTE this test result  excludes the diagnosis                      of VTE.   >499 ng/ml (FEU) : VTE not excluded; additional work up for VTE is                      required. <> Testing on Inpatients and Evaluation of Disseminated Intravascular   Coagulation ([facility]) Reference Range:   0-499 ng/ml (FEU)   Troponin I     Status: None   Collection Time: 10/27/16  2:22 PM  Result Value Ref Range   Troponin I <0.03 <0.03 ng/mL  CBC with Differential/Platelet     Status: None   Collection Time: 11/08/16  2:47 PM  Result Value Ref Range   WBC 5.0 3.4 - 10.8 x10E3/uL   RBC 4.44 3.77 - 5.28 x10E6/uL   Hemoglobin 12.7 11.1 - 15.9 g/dL   Hematocrit 37.6 34.0 - 46.6 %   MCV 85 79 - 97 fL   MCH 28.6 26.6 - 33.0 pg   MCHC 33.8 31.5 - 35.7 g/dL   RDW 14.6 12.3 - 15.4 %   Platelets 226 150 - 379 x10E3/uL   Neutrophils 55 Not Estab. %   Lymphs 35 Not Estab. %   Monocytes 8 Not Estab. %   Eos 2 Not Estab. %   Basos 0 Not Estab. %   Neutrophils Absolute 2.7 1.4 - 7.0 x10E3/uL   Lymphocytes Absolute 1.8 0.7 - 3.1 x10E3/uL   Monocytes Absolute 0.4 0.1 - 0.9 x10E3/uL   EOS (ABSOLUTE) 0.1 0.0 - 0.4 x10E3/uL   Basophils Absolute 0.0 0.0 - 0.2 x10E3/uL   Immature Granulocytes 0 Not Estab. %   Immature Grans (Abs) 0.0 0.0 - 0.1 x10E3/uL  Hepatic function panel     Status: None   Collection Time: 11/08/16  2:47 PM  Result Value Ref Range   Total Protein 7.2 6.0 - 8.5 g/dL   Albumin 4.4 3.6 - 4.8 g/dL   Bilirubin Total 0.4 0.0 - 1.2 mg/dL   Bilirubin, Direct 0.13 0.00 - 0.40 mg/dL   Alkaline Phosphatase 90 39 - 117 IU/L   AST 24 0 - 40 IU/L   ALT 23 0 - 32 IU/L    PHQ2/9: Depression screen [facility] 2/9 01/17/2017 11/10/2016 06/15/2016 12/19/2015 07/07/2015  Decreased Interest 0 0 0 0 0  Down, Depressed, Hopeless 0 0 0 0 0  PHQ - 2 Score 0 0 0 0 0    Fall Risk: Fall Risk  01/17/2017 11/10/2016 06/15/2016 12/19/2015 07/07/2015  Falls in the past year? _0     Functional Status Survey: Is the patient deaf or have  difficulty hearing?: No Does the patient have difficulty seeing, even when wearing glasses/contacts?: No Does the patient have difficulty concentrating, remembering, or making decisions?: No Does the patient have difficulty walking or climbing stairs?: No Does the patient have difficulty dressing or bathing?: No Does the patient have difficulty doing errands alone such as visiting a doctor's office or shopping?: No   Assessment & Plan  1. Adult hypothyroidism Stable - will recheck at next visit  2. Overweight (BMI 25.0-29.9) -Avoid sweets, begin eating more fruits, vegetables, lean meats, and tree nuts as discussed. -Continue to exercise and advance as tolerated.  3. Benign essential HTN Stable, doing well  4. Atherosclerosis of aorta ([place]) Will recheck lipids at next appointment  5. Osteopenia, unspecified location Continue fossamax and weight-bearing exercise  6. Gastroesophageal reflux disease, esophagitis presence not specified Follow up with GI as needed.  7. Crohn's disease of both small and large intestine with rectal bleeding ([place]) Follow up with GI as needed.  8. CVA, old, hemiparesis ([place]) Stable Will recheck lipids at next appointment  9. Oral phase dysphagia - Ambulatory referral to ENT   -Reviewed Health Maintenance: Due for annual exam - will schedule at checkout.

## 2017-01-24 ENCOUNTER — Other Ambulatory Visit: Payer: Self-pay | Admitting: Family Medicine

## 2017-01-24 DIAGNOSIS — G8929 Other chronic pain: Secondary | ICD-10-CM

## 2017-01-24 DIAGNOSIS — M5442 Lumbago with sciatica, left side: Principal | ICD-10-CM

## 2017-01-24 DIAGNOSIS — M5430 Sciatica, unspecified side: Secondary | ICD-10-CM

## 2017-01-24 NOTE — Telephone Encounter (Signed)
Patient requesting refill of Gabapentin and Meloxicam to [facility].

## 2017-01-28 ENCOUNTER — Encounter: Payer: Self-pay | Admitting: Family Medicine

## 2017-01-28 ENCOUNTER — Ambulatory Visit (INDEPENDENT_AMBULATORY_CARE_PROVIDER_SITE_OTHER): Payer: Medicare Other | Admitting: Family Medicine

## 2017-01-28 VITALS — BP 132/68 | HR 78 | Temp 98.3°F | Resp 16 | Ht 60.0 in | Wt 144.2 lb

## 2017-01-28 DIAGNOSIS — R400 Somnolence: Secondary | ICD-10-CM

## 2017-01-28 DIAGNOSIS — E559 Vitamin D deficiency, unspecified: Secondary | ICD-10-CM | POA: Diagnosis not present

## 2017-01-28 DIAGNOSIS — R413 Other amnesia: Secondary | ICD-10-CM | POA: Diagnosis not present

## 2017-01-28 DIAGNOSIS — R0683 Snoring: Secondary | ICD-10-CM | POA: Diagnosis not present

## 2017-01-28 LAB — CBC WITH DIFFERENTIAL/PLATELET
Basophils Absolute: 0 cells/uL (ref 0–200)
Basophils Relative: 0 %
Eosinophils Absolute: 147 cells/uL (ref 15–500)
Eosinophils Relative: 3 %
HCT: 37.9 % (ref 35.0–45.0)
Hemoglobin: 12.3 g/dL (ref 11.7–15.5)
Lymphocytes Relative: 31 %
Lymphs Abs: 1519 cells/uL (ref 850–3900)
MCH: 28.8 pg (ref 27.0–33.0)
MCHC: 32.5 g/dL (ref 32.0–36.0)
MCV: 88.8 fL (ref 80.0–100.0)
MPV: 10 fL (ref 7.5–12.5)
Monocytes Absolute: 392 cells/uL (ref 200–950)
Monocytes Relative: 8 %
Neutro Abs: 2842 cells/uL (ref 1500–7800)
Neutrophils Relative %: 58 %
Platelets: 279 10*3/uL (ref 140–400)
RBC: 4.27 MIL/uL (ref 3.80–5.10)
RDW: 14.8 % (ref 11.0–15.0)
WBC: 4.9 10*3/uL (ref 3.8–10.8)

## 2017-01-28 LAB — TSH: TSH: 1.88 mIU/L

## 2017-01-28 NOTE — Patient Instructions (Signed)

## 2017-01-28 NOTE — Progress Notes (Signed)
Name: Carolyn Campbell   MRN: [id number]    DOB: [birth year]   Date:01/28/2017       Progress Note  Subjective  Chief Complaint  Chief Complaint  Patient presents with  . Dazed and Sleepy    Patient states for the past couple of months she has been noticing she has become increasely sleeply and dazed feeling.     HPI  Somnolence: she is disabled because of strokes and breast cancer. She has been feeling tired for a long time, but much worse over the past week. She states she feels like she will fall asleep any time of the day. She states she usually does not take naps, but she napped three days this week. She has difficulty falling asleep at night, and uses a tablet to play games while in bed. She states her husband told her that she snores. She is staying in bed for about 12 hours, 9 - 10 hours of sleep time but wakes up still feeling tired a few times a week, but always tired in the afternoon. She denies headaches. No fever, no chills , denies vertigo symptoms.   Patient Active Problem List   Diagnosis Date Noted  . Osteopenia 10/18/2016  . Atherosclerosis of aorta ([place]) 12/19/2015  . Vitreous degeneration 05/21/2015  . Bad memory 05/21/2015  . Chronic ulcerative proctitis ([place]) 05/21/2015  . Glaucoma associated with chamber angle anomalies 05/21/2015  . Dermatitis, eczematoid 05/21/2015  . CD (Crohn's disease) ([place]) 05/21/2015  . H/O malignant neoplasm of breast 05/21/2015  . Crohn's disease of both small and large intestine with rectal bleeding ([place]) 12/04/2014  . Solitary pulmonary nodule 11/07/2012  . 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 Cognitive deficits as late effect of cerebrovascular disease 01/20/2010  . CVA, old, hemiparesis ([place]) 04/28/2009  . Adult hypothyroidism 06/12/2008  . Acid reflux 05/23/2007  . Hypercholesterolemia without hypertriglyceridemia 01/18/2007  . Benign essential HTN 01/18/2007    Past Surgical History:  Procedure  Laterality Date  . BREAST BIOPSY Left 2006   POS  . BREAST LUMPECTOMY Left 09/20/2004   positive  . BREAST SURGERY Left    malignant biopsy  . COLONOSCOPY  2015  . ESOPHAGOGASTRODUODENOSCOPY  2015  . FINGER SURGERY     Right small finger  . [place] STUDY  2015  . TUBAL LIGATION      Family History  Problem Relation Age of Onset  . Heart disease Mother   . Heart attack Mother   . Breast cancer Sister   . Alcohol abuse Father   . Cerebrovascular Accident Father   . Hypertension Unknown   . Diabetes Unknown   . Breast cancer Sister 27    Social History   Social History  . Marital status: Married    Spouse name: N/A  . Number of children: N/A  . Years of education: N/A   Occupational History  . Not on file.   Social History Main Topics  . Smoking status: Never Smoker  . Smokeless tobacco: Never Used  . Alcohol use No  . Drug use: No  . Sexual activity: Yes    Partners: Male   Other Topics Concern  . Not on file   Social History Narrative   Married. One son and one daughter. She is disabled after she had breast cancer and strokes. 2 caffeinated beverages daily.     Current Outpatient Prescriptions:  .  alendronate (FOSAMAX) 70 MG tablet, Take 1 tablet by mouth  once a week. Take with a full glass of water on an empty stomach., Disp: 12 tablet, Rfl: 1 .  amLODipine-benazepril (LOTREL) 10-20 MG capsule, Take 1 capsule by mouth daily., Disp: 90 capsule, Rfl: 1 .  atorvastatin (LIPITOR) 40 MG tablet, Take 1 tablet (40 mg total) by mouth daily., Disp: 90 tablet, Rfl: 1 .  azaTHIOprine (IMURAN) 50 MG tablet, Take 1 tablet (50 mg total) by mouth daily., Disp: 90 tablet, Rfl: 3 .  diclofenac sodium (VOLTAREN) 1 % GEL, Apply 2 g topically 4 (four) times daily., Disp: 100 g, Rfl: 0 .  gabapentin (NEURONTIN) 300 MG capsule, TAKE 1 CAPSULE BY MOUTH AT  BEDTIME, Disp: 90 capsule, Rfl: 1 .  latanoprost (XALATAN) 0.005 % ophthalmic solution, Place 1 drop into both eyes at  bedtime. , Disp: , Rfl:  .  levothyroxine (SYNTHROID, LEVOTHROID) 50 MCG tablet, Take 1 tablet (50 mcg total) by mouth daily., Disp: 90 tablet, Rfl: 1 .  meloxicam (MOBIC) 15 MG tablet, Take 1 tablet (15 mg total) by mouth daily., Disp: 30 tablet, Rfl: 0 .  Multiple Vitamin (MULTIVITAMIN) tablet, Take 1 tablet by mouth daily., Disp: , Rfl:  .  sertraline (ZOLOFT) 100 MG tablet, Take 1 tablet (100 mg total) by mouth daily., Disp: 90 tablet, Rfl: 1 .  traMADol (ULTRAM) 50 MG tablet, Take 1 tablet (50 mg total) by mouth every 6 (six) hours as needed., Disp: 6 tablet, Rfl: 0 .  valACYclovir (VALTREX) 1000 MG tablet, Take 1 tablet (1,000 mg total) by mouth 2 (two) times daily., Disp: 30 tablet, Rfl: 0  Allergies  Allergen Reactions  . Aspirin Other (See Comments)    She has Chron's and advised to not take it by GI     ROS  Constitutional: Negative for fever or weight change.  Respiratory: Positive  for dry cough but no shortness of breath.   Cardiovascular: Negative for chest pain or palpitations.  Gastrointestinal: Negative for abdominal pain, no bowel changes.  Musculoskeletal: Negative for gait problem or joint swelling.  Skin: Negative for rash.  Neurological: Negative for dizziness or headache.  No other specific complaints in a complete review of systems (except as listed in HPI above).  Objective  Vitals:   01/28/17 1511  BP: 132/68  Pulse: 78  Resp: 16  Temp: 98.3 F (36.8 C)  TempSrc: Oral  SpO2: 96%  Weight: 144 lb 3.2 oz (65.4 kg)  Height: 5' (1.524 m)    Body mass index is 28.16 kg/m.  Physical Exam  Constitutional: Patient appears well-developed and well-nourished.No distress.  HEENT: head atraumatic, normocephalic, pupils equal and reactive to light, ear normal exam, no nystagmus,  neck supple, throat within normal limits Cardiovascular: Normal rate, regular rhythm and normal heart sounds.  No murmur heard. No BLE edema. Pulmonary/Chest: Effort normal and  breath sounds normal. No respiratory distress. Abdominal: Soft.  There is no tenderness. Psychiatric: Patient has a normal mood and affect. behavior is normal Neurological: slow speech pattern, gait also slow  Recent Results (from the past 2160 hour(s))  CBC with Differential/Platelet     Status: None   Collection Time: 11/08/16  2:47 PM  Result Value Ref Range   WBC 5.0 3.4 - 10.8 x10E3/uL   RBC 4.44 3.77 - 5.28 x10E6/uL   Hemoglobin 12.7 11.1 - 15.9 g/dL   Hematocrit 37.6 34.0 - 46.6 %   MCV 85 79 - 97 fL   MCH 28.6 26.6 - 33.0 pg   MCHC 33.8 31.5 -  35.7 g/dL   RDW 14.6 12.3 - 15.4 %   Platelets 226 150 - 379 x10E3/uL   Neutrophils 55 Not Estab. %   Lymphs 35 Not Estab. %   Monocytes 8 Not Estab. %   Eos 2 Not Estab. %   Basos 0 Not Estab. %   Neutrophils Absolute 2.7 1.4 - 7.0 x10E3/uL   Lymphocytes Absolute 1.8 0.7 - 3.1 x10E3/uL   Monocytes Absolute 0.4 0.1 - 0.9 x10E3/uL   EOS (ABSOLUTE) 0.1 0.0 - 0.4 x10E3/uL   Basophils Absolute 0.0 0.0 - 0.2 x10E3/uL   Immature Granulocytes 0 Not Estab. %   Immature Grans (Abs) 0.0 0.0 - 0.1 x10E3/uL  Hepatic function panel     Status: None   Collection Time: 11/08/16  2:47 PM  Result Value Ref Range   Total Protein 7.2 6.0 - 8.5 g/dL   Albumin 4.4 3.6 - 4.8 g/dL   Bilirubin Total 0.4 0.0 - 1.2 mg/dL   Bilirubin, Direct 0.13 0.00 - 0.40 mg/dL   Alkaline Phosphatase 90 39 - 117 IU/L   AST 24 0 - 40 IU/L   ALT 23 0 - 32 IU/L      PHQ2/9: Depression screen [facility] 2/9 01/17/2017 11/10/2016 06/15/2016 12/19/2015 07/07/2015  Decreased Interest 0 0 0 0 0  Down, Depressed, Hopeless 0 0 0 0 0  PHQ - 2 Score 0 0 0 0 0     Fall Risk: Fall Risk  01/17/2017 11/10/2016 06/15/2016 12/19/2015 07/07/2015  Falls in the past year? No No No No No     Assessment & Plan  1. Somnolence  - CBC with Differential/Platelet - COMPLETE METABOLIC PANEL WITH GFR - TSH - Vitamin B12 - VITAMIN D 25 Hydroxy (Vit-D Deficiency, Fractures) Discussed sleep  hygiene  2. Snoring  Discussed referral for sleep study or neurologist but she wants to wait until lab results.  ESS 16

## 2017-01-29 LAB — COMPLETE METABOLIC PANEL WITH GFR
ALT: 11 U/L (ref 6–29)
AST: 17 U/L (ref 10–35)
Albumin: 4.3 g/dL (ref 3.6–5.1)
Alkaline Phosphatase: 79 U/L (ref 33–130)
BUN: 11 mg/dL (ref 7–25)
CO2: 26 mmol/L (ref 20–31)
Calcium: 9.9 mg/dL (ref 8.6–10.4)
Chloride: 105 mmol/L (ref 98–110)
Creat: 0.73 mg/dL (ref 0.50–0.99)
GFR, Est African American: >89 mL/min (ref >=60)
GFR, Est Non African American: 88 mL/min (ref >=60)
Glucose, Bld: 83 mg/dL (ref 65–99)
Potassium: 4.1 mmol/L (ref 3.5–5.3)
Sodium: 142 mmol/L (ref 135–146)
Total Bilirubin: 0.5 mg/dL (ref 0.2–1.2)
Total Protein: 7 g/dL (ref 6.1–8.1)

## 2017-01-29 LAB — VITAMIN D 25 HYDROXY (VIT D DEFICIENCY, FRACTURES): Vit D, 25-Hydroxy: 29 ng/mL — ABNORMAL LOW (ref 30–100)

## 2017-01-29 LAB — VITAMIN B12: Vitamin B-12: 687 pg/mL (ref 200–1100)

## 2017-02-09 ENCOUNTER — Encounter: Payer: Self-pay | Admitting: Family Medicine

## 2017-02-23 ENCOUNTER — Other Ambulatory Visit: Payer: Self-pay | Admitting: Unknown Physician Specialty

## 2017-02-23 DIAGNOSIS — R1312 Dysphagia, oropharyngeal phase: Secondary | ICD-10-CM

## 2017-02-23 DIAGNOSIS — K219 Gastro-esophageal reflux disease without esophagitis: Secondary | ICD-10-CM | POA: Diagnosis not present

## 2017-02-23 DIAGNOSIS — R1310 Dysphagia, unspecified: Secondary | ICD-10-CM | POA: Diagnosis not present

## 2017-02-24 ENCOUNTER — Telehealth: Payer: Self-pay

## 2017-02-24 NOTE — Telephone Encounter (Signed)
Left message for patient to call back regarding medication and seeing ENT for Dysphagia.

## 2017-02-24 NOTE — Telephone Encounter (Signed)
Patient requesting refill of Pantoprazole to [facility].

## 2017-02-24 NOTE — Telephone Encounter (Signed)
Pantoprazole is not on patient's medication list and it does not appear in her medication history. I can see that she has a history of GERD and she trialed Omeprazole in 2016 without good efficacy.  Please call pt to clarify. Also please ask - Has she been able to see ENT about the dysphagia?  Thank you!

## 2017-02-25 NOTE — Telephone Encounter (Signed)
PT CALLED BACK AND SAID THAT SHE GOT THE DR THAT PRESCRIBED THE MEDICATION TO HER TO REFILL IT.

## 2017-03-08 ENCOUNTER — Ambulatory Visit (INDEPENDENT_AMBULATORY_CARE_PROVIDER_SITE_OTHER): Payer: Medicare Other | Admitting: Family Medicine

## 2017-03-08 ENCOUNTER — Ambulatory Visit
Admission: RE | Admit: 2017-03-08 | Discharge: 2017-03-08 | Disposition: A | Discharge status: Home / Self Care | Payer: Medicare Other | Source: Ambulatory Visit | Attending: Family Medicine | Admitting: Family Medicine

## 2017-03-08 ENCOUNTER — Encounter: Payer: Self-pay | Admitting: Family Medicine

## 2017-03-08 ENCOUNTER — Telehealth: Payer: Self-pay

## 2017-03-08 ENCOUNTER — Other Ambulatory Visit: Payer: Self-pay | Admitting: Family Medicine

## 2017-03-08 VITALS — BP 140/76 | HR 97 | Temp 98.2°F | Resp 16 | Ht 60.0 in | Wt 142.2 lb

## 2017-03-08 DIAGNOSIS — M79605 Pain in left leg: Secondary | ICD-10-CM | POA: Insufficient documentation

## 2017-03-08 DIAGNOSIS — M7989 Other specified soft tissue disorders: Secondary | ICD-10-CM | POA: Insufficient documentation

## 2017-03-08 MED ORDER — BENAZEPRIL HCL 40 MG PO TABS: 40.0000 mg | ORAL_TABLET | Freq: Every day | ORAL | 0 refills | Status: DC

## 2017-03-08 MED ORDER — HYDROCHLOROTHIAZIDE 12.5 MG PO CAPS: 12.5000 mg | ORAL_CAPSULE | Freq: Every day | ORAL | 0 refills | Status: DC

## 2017-03-08 NOTE — Progress Notes (Addendum)
Name: Carolyn Campbell   MRN: [id number]    DOB: [birth year]   Date:03/08/2017       Progress Note  Subjective  Chief Complaint  Chief Complaint  Patient presents with  . Leg Swelling    and pain    HPI  PT presents with c/o BLE edema for several months and LEFT lower extremity pain x1 week.  No chest pain or shortness of breath, palpitations, or increased anxiety; no numbness or tingling, no skin color changes.  Edema usually goes down at night, but the left side has been staying swollen over the last week.   Has HTN, atherosclerosis of the aorta and hx CVA. Has Crohn disease and does not take Aspirin and is not on anticoagulation.  Has been taking BP medications as prescribed.  Patient Active Problem List   Diagnosis Date Noted  . Osteopenia 10/18/2016  . Atherosclerosis of aorta ([place]) 12/19/2015  . Vitreous degeneration 05/21/2015  . Bad memory 05/21/2015  . Chronic ulcerative proctitis ([place]) 05/21/2015  . Glaucoma associated with chamber angle anomalies 05/21/2015  . Dermatitis, eczematoid 05/21/2015  . CD (Crohn's disease) ([place]) 05/21/2015  . H/O malignant neoplasm of breast 05/21/2015  . Crohn's disease of both small and large intestine with rectal bleeding ([place]) 12/04/2014  . Solitary pulmonary nodule 11/07/2012  . 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 Cognitive deficits as late effect of cerebrovascular disease 01/20/2010  . CVA, old, hemiparesis ([place]) 04/28/2009  . Adult hypothyroidism 06/12/2008  . Acid reflux 05/23/2007  . Hypercholesterolemia without hypertriglyceridemia 01/18/2007  . Benign essential HTN 01/18/2007    Social History  Substance Use Topics  . Smoking status: Never Smoker  . Smokeless tobacco: Never Used  . Alcohol use No     Current Outpatient Prescriptions:  .  alendronate (FOSAMAX) 70 MG tablet, Take 1 tablet by mouth once a week. Take with a full glass of water on an empty stomach., Disp: 12 tablet, Rfl: 1 .   amLODipine-benazepril (LOTREL) 10-20 MG capsule, Take 1 capsule by mouth daily., Disp: 90 capsule, Rfl: 1 .  atorvastatin (LIPITOR) 40 MG tablet, Take 1 tablet (40 mg total) by mouth daily., Disp: 90 tablet, Rfl: 1 .  azaTHIOprine (IMURAN) 50 MG tablet, Take 1 tablet (50 mg total) by mouth daily., Disp: 90 tablet, Rfl: 3 .  diclofenac sodium (VOLTAREN) 1 % GEL, Apply 2 g topically 4 (four) times daily., Disp: 100 g, Rfl: 0 .  gabapentin (NEURONTIN) 300 MG capsule, TAKE 1 CAPSULE BY MOUTH AT  BEDTIME, Disp: 90 capsule, Rfl: 1 .  latanoprost (XALATAN) 0.005 % ophthalmic solution, Place 1 drop into both eyes at bedtime. , Disp: , Rfl:  .  levothyroxine (SYNTHROID, LEVOTHROID) 50 MCG tablet, Take 1 tablet (50 mcg total) by mouth daily., Disp: 90 tablet, Rfl: 1 .  meloxicam (MOBIC) 15 MG tablet, Take 1 tablet (15 mg total) by mouth daily., Disp: 30 tablet, Rfl: 0 .  Multiple Vitamin (MULTIVITAMIN) tablet, Take 1 tablet by mouth daily., Disp: , Rfl:  .  pantoprazole (PROTONIX) 40 MG tablet, Take 40 mg by mouth daily., Disp: , Rfl:  .  sertraline (ZOLOFT) 100 MG tablet, Take 1 tablet (100 mg total) by mouth daily., Disp: 90 tablet, Rfl: 1 .  traMADol (ULTRAM) 50 MG tablet, Take 1 tablet (50 mg total) by mouth every 6 (six) hours as needed., Disp: 6 tablet, Rfl: 0 .  valACYclovir (VALTREX) 1000 MG tablet, Take 1 tablet (1,000 mg total) by  mouth 2 (two) times daily., Disp: 30 tablet, Rfl: 0  Allergies  Allergen Reactions  . Aspirin Other (See Comments)    She has Chron's and advised to not take it by GI    ROS  Constitutional: Negative for fever or weight change.  Respiratory: Negative for cough and shortness of breath.   Cardiovascular: Negative for chest pain or palpitations.  Gastrointestinal: Negative for abdominal pain, no bowel changes.  Musculoskeletal: See HPI Skin: Negative for rash.  Neurological: Negative for dizziness or headache.  No other specific complaints in a complete review of  systems (except as listed in HPI above).  Objective  Vitals:   03/08/17 1125  BP: 140/76  Pulse: 97  Resp: 16  Temp: 98.2 F (36.8 C)  TempSrc: Oral  SpO2: 96%  Weight: 142 lb 3.2 oz (64.5 kg)  Height: 5' (1.524 m)    Body mass index is 27.77 kg/m.  Nursing Note and Vital Signs reviewed.  Physical Exam  Constitutional: Patient appears well-developed and well-nourished.  No distress.  HEENT: head atraumatic, normocephalic Cardiovascular: Normal rate, regular rhythm, S1/S2 present.  No murmur or rub heard. No BLE edema. Pulmonary/Chest: Effort normal and breath sounds clear. No respiratory distress or retractions. MSK: Antalgic gait favoring the LLE. Pain on palpation to upper anterior thigh and calf of LLE, no erythema or heat.  Left calf moderately taught and has moderate non-pitting edema on palpation. RLE exhibits mild non-pitting edema.  Pedal pulses +2 bilaterally, cap refill <3 seconds bilaterally. Full AROM. Psychiatric: Patient has a normal mood and affect. behavior is normal. Judgment and thought content normal.  Recent Results (from the past 2160 hour(s))  CBC with Differential/Platelet     Status: None   Collection Time: 01/28/17  3:59 PM  Result Value Ref Range   WBC 4.9 3.8 - 10.8 K/uL   RBC 4.27 3.80 - 5.10 MIL/uL   Hemoglobin 12.3 11.7 - 15.5 g/dL   HCT 37.9 35.0 - 45.0 %   MCV 88.8 80.0 - 100.0 fL   MCH 28.8 27.0 - 33.0 pg   MCHC 32.5 32.0 - 36.0 g/dL   RDW 14.8 11.0 - 15.0 %   Platelets 279 140 - 400 K/uL   MPV 10.0 7.5 - 12.5 fL   Neutro Abs 2,842 1,500 - 7,800 cells/uL   Lymphs Abs 1,519 850 - 3,900 cells/uL   Monocytes Absolute 392 200 - 950 cells/uL   Eosinophils Absolute 147 15 - 500 cells/uL   Basophils Absolute 0 0 - 200 cells/uL   Neutrophils Relative % 58 %   Lymphocytes Relative 31 %   Monocytes Relative 8 %   Eosinophils Relative 3 %   Basophils Relative 0 %   Smear Review Criteria for review not met   COMPLETE METABOLIC PANEL WITH  GFR     Status: None   Collection Time: 01/28/17  3:59 PM  Result Value Ref Range   Sodium 142 135 - 146 mmol/L   Potassium 4.1 3.5 - 5.3 mmol/L   Chloride 105 98 - 110 mmol/L   CO2 26 20 - 31 mmol/L   Glucose, Bld 83 65 - 99 mg/dL   BUN 11 7 - 25 mg/dL   Creat 0.73 0.50 - 0.99 mg/dL    Comment:   For patients > or = 63 years of age: The upper reference limit for Creatinine is approximately 13% higher for people identified as African-American.      Total Bilirubin 0.5 0.2 - 1.2 mg/dL  Alkaline Phosphatase 79 33 - 130 U/L   AST 17 10 - 35 U/L   ALT 11 6 - 29 U/L   Total Protein 7.0 6.1 - 8.1 g/dL   Albumin 4.3 3.6 - 5.1 g/dL   Calcium 9.9 8.6 - 10.4 mg/dL   GFR, Est African American >89 >=60 mL/min   GFR, Est Non African American 88 >=60 mL/min  TSH     Status: None   Collection Time: 01/28/17  3:59 PM  Result Value Ref Range   TSH 1.88 mIU/L    Comment:   Reference Range   > or = 20 Years  0.40-4.50   Pregnancy Range First trimester  0.26-2.66 Second trimester 0.55-2.73 Third trimester  0.43-2.91     Vitamin B12     Status: None   Collection Time: 01/28/17  3:59 PM  Result Value Ref Range   Vitamin B-12 687 200 - 1,100 pg/mL  VITAMIN D 25 Hydroxy (Vit-D Deficiency, Fractures)     Status: Abnormal   Collection Time: 01/28/17  3:59 PM  Result Value Ref Range   Vit D, 25-Hydroxy 29 (L) 30 - 100 ng/mL    Comment: Vitamin D Status           25-OH Vitamin D        Deficiency                <20 ng/mL        Insufficiency         20 - 29 ng/mL        Optimal             > or = 30 ng/mL   For 25-OH Vitamin D testing on patients on D2-supplementation and patients for whom quantitation of D2 and D3 fractions is required, the QuestAssureD 25-OH VIT D, (D2,D3), LC/MS/MS is recommended: order code [id number] (patients > 2 yrs).      Assessment & Plan  1. Pain and swelling of lower extremity, left  - US Venous Img Lower Unilateral Left; Future - 1:45 today in [place],  patient is aware of appointment and agreeable to present for imaging.  2. Swelling of right lower extremity   -Discussed possibility of new onset clot in LLE in the last week in combination with BLE dependent edema that has been ongoing for several months.  Discussed the need for stat US to rule out clot. If this is negative, we will consider other causes of swelling including use of CCB for HTN and venous insufficiency. We will also consider daily use of compression stockings, elevating feet at night, etc. She is advised to continue current regimen and not to wear compression stockings until results are returned.  -Red flags and when to present for emergency care or RTC including fever >101.52F, chest pain, shortness of breath, new/worsening/un-resolving symptoms, significant increase in pain or swelling reviewed with patient at time of visit. Follow up and care instructions discussed and provided in AVS.   I have reviewed this encounter including the documentation in this note and/or discussed this patient with the Johney Maine, FNP, NP-C. I am certifying that I agree with the content of this note as supervising physician.  Steele Sizer, MD [facility] 03/08/2017, 1:07 PM

## 2017-03-08 NOTE — Patient Instructions (Signed)
Please go to [place] Location at [facility] (Across from the [facility]) for your ultrasound. Please arrive by 1:30, your appointment is at 1:45pm.

## 2017-03-08 NOTE — Progress Notes (Signed)
Hello Ms. Treloar,  Your ultrasound was negative for clot - good news. I want you to STOP taking your Lotrel (Amlodipine-Benazepril).  I have sent in two pills that I want you to take daily in place of the Lotrel.  These two pills are Benazepril and Hydrochlorothiazide.  Please also wear compression stockings any time you are awake and remove them to sleep. I will have someone call you soon to schedule a 2 week follow up.  If you have any questions, do not hesitate to reach out.  Best, Raelyn Ensign NP-C

## 2017-03-08 NOTE — Progress Notes (Signed)
Please call patient and let her know her [place] was negative for clot. I want her to stop her Lotrel (Amlodipine-Benazepril), and start Benazepril 40mg  and HCTZ 12.5mg  - I have sent 30 day supply to pharmacy.  She should also start wearing compression stockings daily unless she is sleeping.  Also remind her to elevate her feet at night.  Please schedule her for a 2 week follow up.  If she experiences lightheadedness, dizziness, chest pain, or shortness of breath, she needs to seek immediate medical attention. Thank you!

## 2017-03-08 NOTE — Telephone Encounter (Signed)
Per the request of Raelyn Ensign, FNP, I contacted this patient to relay the message below:  Please call patient and let her know her [place] was negative for clot. I want her to stop her Lotrel (Amlodipine-Benazepril), and start Benazepril 40mg  and HCTZ 12.5mg  - I have sent 30 day supply to pharmacy.  She should also start wearing compression stockings daily unless she is sleeping.  Also remind her to elevate her feet at night.  Please schedule her for a 2 week follow up.  If she experiences lightheadedness, dizziness, chest pain, or shortness of breath, she needs to seek immediate medical attention.   Patient said ok and thank you.

## 2017-03-10 ENCOUNTER — Other Ambulatory Visit: Payer: Self-pay | Admitting: Family Medicine

## 2017-03-10 NOTE — Telephone Encounter (Signed)
Patient requesting refill of Fosamax to [facility].

## 2017-03-14 ENCOUNTER — Other Ambulatory Visit: Payer: Self-pay | Admitting: Family Medicine

## 2017-03-14 DIAGNOSIS — E7800 Pure hypercholesterolemia, unspecified: Secondary | ICD-10-CM

## 2017-03-14 DIAGNOSIS — I700 Atherosclerosis of aorta: Secondary | ICD-10-CM

## 2017-03-14 DIAGNOSIS — I10 Essential (primary) hypertension: Secondary | ICD-10-CM

## 2017-03-14 DIAGNOSIS — E038 Other specified hypothyroidism: Secondary | ICD-10-CM

## 2017-03-14 DIAGNOSIS — F325 Major depressive disorder, single episode, in full remission: Secondary | ICD-10-CM

## 2017-03-14 NOTE — Telephone Encounter (Signed)
Patient requesting refill of Atorvastatin, Levothyroxine, Zoloft and Lotrel to [facility].

## 2017-03-16 ENCOUNTER — Ambulatory Visit
Admission: RE | Admit: 2017-03-16 | Discharge: 2017-03-16 | Disposition: A | Discharge status: Home / Self Care | Payer: Medicare Other | Source: Ambulatory Visit | Attending: Unknown Physician Specialty | Admitting: Unknown Physician Specialty

## 2017-03-16 ENCOUNTER — Other Ambulatory Visit: Payer: Self-pay

## 2017-03-16 DIAGNOSIS — E7800 Pure hypercholesterolemia, unspecified: Secondary | ICD-10-CM

## 2017-03-16 DIAGNOSIS — K219 Gastro-esophageal reflux disease without esophagitis: Secondary | ICD-10-CM | POA: Insufficient documentation

## 2017-03-16 DIAGNOSIS — R1310 Dysphagia, unspecified: Secondary | ICD-10-CM | POA: Insufficient documentation

## 2017-03-16 DIAGNOSIS — R1312 Dysphagia, oropharyngeal phase: Secondary | ICD-10-CM

## 2017-03-16 DIAGNOSIS — I700 Atherosclerosis of aorta: Secondary | ICD-10-CM

## 2017-03-16 DIAGNOSIS — E038 Other specified hypothyroidism: Secondary | ICD-10-CM

## 2017-03-16 DIAGNOSIS — F325 Major depressive disorder, single episode, in full remission: Secondary | ICD-10-CM

## 2017-03-16 NOTE — Therapy (Signed)
[place], [place], [address] Phone: [phone]   Fax:     Modified Barium Swallow  Patient Details  Name: Carolyn Campbell MRN: [id number] Date of Birth: 1956/06/09 No Data Recorded  Encounter Date: 03/16/2017      End of Session - 03/16/17 1359    Visit Number 1   Number of Visits 1   Date for SLP Re-Evaluation 03/16/17   SLP Start Time 77   SLP Stop Time  1330   SLP Time Calculation (min) 60 min   Activity Tolerance Patient tolerated treatment well      Past Medical History:  Diagnosis Date  . Allergic rhinitis   . Asthma   . Breast cancer ([place]) 2006   LT LUMPECTOMY  . Cognitive deficits as late effect of cerebrovascular disease   . Crohn's disease of both small and large intestine with rectal bleeding ([place]) 12/04/2014  . 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 Esophageal reflux   . Glaucoma    vitreous degeneration  . Hyperlipidemia   . Hypertension   . Hypothyroidism   . Occlusion, cerebral artery    NOS w/infarction  . Osteoporosis   . Personal history of radiation therapy 2006   BREAST CA    Past Surgical History:  Procedure Laterality Date  . BREAST BIOPSY Left 2006   POS  . BREAST LUMPECTOMY Left 09/20/2004   positive  . BREAST SURGERY Left    malignant biopsy  . COLONOSCOPY  2015  . ESOPHAGOGASTRODUODENOSCOPY  2015  . FINGER SURGERY     Right small finger  . [place] STUDY  2015  . TUBAL LIGATION      There were no vitals filed for this visit.   Subjective: Patient behavior: (alertness, ability to follow instructions, etc.): able to follow directions and verbalize complaints  Chief complaint: S/P CVA 2008, EGD revealing hiatal hernia and gastritis, Crohns, LPR and complaints of globus sensation, coughing with talking/screaming, and needing to "think" in order to swallow  MBS 12/11/2014: Pt exhibits mild sensory based pharyngeal dysphagia  characterized by decreased sensation leading to swallow initiation at the pyriform sinuses. Pt appears to be compensating for decreased oral cohesion and control due to hesitation and brief holding of bolus in oral cavity prior to initiating posterior transfer. Mild residue in vallecular and pyriform sinsus residue due to reduced tongue base retraction and laryngeal elevation. Airway was protected without penetration or aspiration during this assessment. Esophagus briefly scanned with observation of pill stopping mid esophagus not transited by liquid, however puree consistency facilitated transfer to stomach. Educated pt to use precaution and take small sips, straws allowed, swallow 2 times after every several bites and sips, esophageal precautions and bites puree following pills. Regular diet texture and thin liqiuds recommended.   Objective:  Radiological Procedure: A videoflouroscopic evaluation of oral-preparatory, reflex initiation, and pharyngeal phases of the swallow was performed; as well as a screening of the upper esophageal phase.  I. POSTURE: Upright in MB chair, standing  II. VIEW: Lateral and A-P  III. COMPENSATORY STRATEGIES: dry swallows clear pharyngeal residue  IV. BOLUSES ADMINISTERED:   Thin Liquid: 2 small cup rim, 3 rapid consecutive   Nectar-thick Liquid: 1 moderate cup rim   Honey-thick Liquid: DNT   Puree: 2 teaspoon presentations   Mechanical Soft: 1/4 graham cracker in applesauce  V. RESULTS OF EVALUATION: A. ORAL PREPARATORY PHASE: (The lips, tongue, and velum are observed  for strength and coordination)       **Overall Severity Rating: Mild; disorganized and prolonged posterior transfer  B. SWALLOW INITIATION/REFLEX: (The reflex is normal if "triggered" by the time the bolus reached the base of the tongue)  **Overall Severity Rating: Mild-moderate; triggers at the valleculae  C. PHARYNGEAL PHASE: (Pharyngeal function is normal if the bolus shows rapid,  smooth, and continuous transit through the pharynx and there is no pharyngeal residue after the swallow)  **Overall Severity Rating: Mild; reduced tongue base retraction, reduced hyolaryngeal excursion, mild pharyngeal residue (greater right than left)  D. LARYNGEAL PENETRATION: (Material entering into the laryngeal inlet/vestibule but not aspirated) None  E. ASPIRATION: None  F. ESOPHAGEAL PHASE: (Screening of the upper esophagus): no observed abnormality within the viewable cervical esophagus; patient has documented esophogeal problems  ASSESSMENT: This 61 year old woman; S/P CVA 2008, EGD revealing hiatal hernia and gastritis, Crohns, LPR and complaints of globus sensation, coughing with talking/screaming, and needing to "think" in order to swallow; is presenting with mild oropharyngeal dysphagia characterized by disorganized/prolonged oral transit, delayed swallow initiation, and reduced pharyngeal pressure generation (decreased hyolaryngeal excursion and tongue base retraction) with mild pharyngeal residue (primarily right pyriform sinus).  There is no observed laryngeal penetration or tracheal aspiration and the patient is not at significant risk for prandial aspiration.  The patient's oropharyngeal dysphagia is most likely due to a combination of neuromuscular changes due to stroke and decreased sensation of the larynx and pharynx due to LPR.  The patient was counseled that her swallowing is safe; her symptoms can be explained by the stroke and LPR and do not pose a health risk.  She was counseled to avoid screaming or other vocal habits that elicit coughing.    PLAN/RECOMMENDATIONS:   A. Diet: Regular    B. Swallowing Precautions: Continue to take your time with swallowing, follow reflux precautions, avoid screaming   C. Recommended consultation to: follow up with MD as recommended   D. Therapy recommendations: not indicated at this time   E. Results and recommendations were discussed  with the patient immediately following the study and the final report routed to referring MD   Oropharyngeal dysphagia - Plan: DG OP Swallowing Func-Medicare/Speech Path, DG OP Swallowing Func-Medicare/Speech Path      G-Codes - 03/26/2017 1400    Functional Assessment Tool Used MBSS, clinical judgment   Functional Limitations Swallowing   Swallow Current Status (B4496) At least 20 percent but less than 40 percent impaired, limited or restricted   Swallow Goal Status (P5916) At least 20 percent but less than 40 percent impaired, limited or restricted   Swallow Discharge Status [id number]) At least 20 percent but less than 40 percent impaired, limited or restricted          Problem List Patient Active Problem List   Diagnosis Date Noted  . Osteopenia 10/18/2016  . Atherosclerosis of aorta ([place]) 12/19/2015  . Vitreous degeneration 05/21/2015  . Bad memory 05/21/2015  . Chronic ulcerative proctitis ([place]) 05/21/2015  . Glaucoma associated with chamber angle anomalies 05/21/2015  . Dermatitis, eczematoid 05/21/2015  . CD (Crohn's disease) ([place]) 05/21/2015  . H/O malignant neoplasm of breast 05/21/2015  . Crohn's disease of both small and large intestine with rectal bleeding ([place]) 12/04/2014  . Solitary pulmonary nodule 11/07/2012  . 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 Cognitive deficits as late effect of cerebrovascular disease 01/20/2010  . CVA, old, hemiparesis ([place]) 04/28/2009  . Adult hypothyroidism 06/12/2008  .  Acid reflux 05/23/2007  . Hypercholesterolemia without hypertriglyceridemia 01/18/2007  . Benign essential HTN 01/18/2007    Leroy Sea, MS/CCC- SLP  Lou Miner 03/16/2017, 2:00 PM  [place], [place], [address] Phone: [phone]   Fax:     Name: Carolyn Campbell MRN: [id number] Date of Birth: [birth year]

## 2017-03-16 NOTE — Telephone Encounter (Signed)
Patient requesting refill of Atorvastatin, Levothyroxine and Zoloft to [facility].

## 2017-03-18 ENCOUNTER — Encounter: Payer: Self-pay | Admitting: Family Medicine

## 2017-03-18 ENCOUNTER — Ambulatory Visit (INDEPENDENT_AMBULATORY_CARE_PROVIDER_SITE_OTHER): Payer: Medicare Other | Admitting: Family Medicine

## 2017-03-18 VITALS — BP 122/84 | HR 100 | Temp 98.1°F | Resp 16 | Ht 60.0 in | Wt 143.4 lb

## 2017-03-18 DIAGNOSIS — R600 Localized edema: Secondary | ICD-10-CM | POA: Diagnosis not present

## 2017-03-18 DIAGNOSIS — I10 Essential (primary) hypertension: Secondary | ICD-10-CM | POA: Diagnosis not present

## 2017-03-18 MED ORDER — HYDROCHLOROTHIAZIDE 25 MG PO TABS: 25.0000 mg | ORAL_TABLET | Freq: Every day | ORAL | 0 refills | Status: DC

## 2017-03-18 MED ORDER — BENAZEPRIL HCL 40 MG PO TABS: 40.0000 mg | ORAL_TABLET | Freq: Every day | ORAL | 0 refills | Status: DC

## 2017-03-18 NOTE — Progress Notes (Addendum)
Name: Carolyn Campbell   MRN: [id number]    DOB: Jul 23, 1956   Date:03/18/2017       Progress Note  Subjective  Chief Complaint  Chief Complaint  Patient presents with  . Follow-up    leg pain  2 week recheck    HPI  Pt presents to follow up on BLE swelling, worse on LEFT ankle/calf. She switched her BP medication Microzide and Benazapril and notes this has made a difference in the swelling, but she is still having some swelling and would like to increase dosing of HCTZ. Not wearing compression socks often and not elevated feet, so still having some swelling - is willing to do this, but forgot. Does not use anything for pain, but does have Voltaren gel at home she is willing to try.   HTN: BP is at goal today, but she would like to increase HCTZ dosing because it has helped with her swelling so much. She denies side effects - no lightheadedness, chest pain, shortness of breath, no near-syncope/syncope.  Patient Active Problem List   Diagnosis Date Noted  . Osteopenia 10/18/2016  . Atherosclerosis of aorta ([place]) 12/19/2015  . Vitreous degeneration 05/21/2015  . Bad memory 05/21/2015  . Chronic ulcerative proctitis ([place]) 05/21/2015  . Glaucoma associated with chamber angle anomalies 05/21/2015  . Dermatitis, eczematoid 05/21/2015  . CD (Crohn's disease) ([place]) 05/21/2015  . H/O malignant neoplasm of breast 05/21/2015  . Crohn's disease of both small and large intestine with rectal bleeding ([place]) 12/04/2014  . Solitary pulmonary nodule 11/07/2012  . 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 Cognitive deficits as late effect of cerebrovascular disease 01/20/2010  . CVA, old, hemiparesis ([place]) 04/28/2009  . Adult hypothyroidism 06/12/2008  . Acid reflux 05/23/2007  . Hypercholesterolemia without hypertriglyceridemia 01/18/2007  . Benign essential HTN 01/18/2007    Social History  Substance Use Topics  . Smoking status: Never Smoker  . Smokeless tobacco: Never  Used  . Alcohol use No     Current Outpatient Prescriptions:  .  alendronate (FOSAMAX) 70 MG tablet, TAKE 1 TABLET BY MOUTH ONCE A WEEK. TAKE WITH A FULL  GLASS OF WATER ON AN EMPTY  STOMACH., Disp: 12 tablet, Rfl: 2 .  atorvastatin (LIPITOR) 40 MG tablet, Take 1 tablet (40 mg total) by mouth daily., Disp: 90 tablet, Rfl: 1 .  azaTHIOprine (IMURAN) 50 MG tablet, Take 1 tablet (50 mg total) by mouth daily., Disp: 90 tablet, Rfl: 3 .  benazepril (LOTENSIN) 40 MG tablet, Take 1 tablet (40 mg total) by mouth daily., Disp: 30 tablet, Rfl: 0 .  diclofenac sodium (VOLTAREN) 1 % GEL, Apply 2 g topically 4 (four) times daily., Disp: 100 g, Rfl: 0 .  gabapentin (NEURONTIN) 300 MG capsule, TAKE 1 CAPSULE BY MOUTH AT  BEDTIME, Disp: 90 capsule, Rfl: 1 .  hydrochlorothiazide (MICROZIDE) 12.5 MG capsule, Take 1 capsule (12.5 mg total) by mouth daily., Disp: 30 capsule, Rfl: 0 .  latanoprost (XALATAN) 0.005 % ophthalmic solution, Place 1 drop into both eyes at bedtime. , Disp: , Rfl:  .  levothyroxine (SYNTHROID, LEVOTHROID) 50 MCG tablet, Take 1 tablet (50 mcg total) by mouth daily., Disp: 90 tablet, Rfl: 1 .  meloxicam (MOBIC) 15 MG tablet, Take 1 tablet (15 mg total) by mouth daily., Disp: 30 tablet, Rfl: 0 .  Multiple Vitamin (MULTIVITAMIN) tablet, Take 1 tablet by mouth daily., Disp: , Rfl:  .  pantoprazole (PROTONIX) 40 MG tablet, Take 40 mg by mouth  daily., Disp: , Rfl:  .  sertraline (ZOLOFT) 100 MG tablet, Take 1 tablet (100 mg total) by mouth daily., Disp: 90 tablet, Rfl: 1 .  traMADol (ULTRAM) 50 MG tablet, Take 1 tablet (50 mg total) by mouth every 6 (six) hours as needed., Disp: 6 tablet, Rfl: 0 .  valACYclovir (VALTREX) 1000 MG tablet, Take 1 tablet (1,000 mg total) by mouth 2 (two) times daily., Disp: 30 tablet, Rfl: 0  Allergies  Allergen Reactions  . Aspirin Other (See Comments)    She has Chron's and advised to not take it by GI    ROS  Constitutional: Negative for fever or weight  change.  Respiratory: Negative for cough and shortness of breath.   Cardiovascular: Negative for chest pain or palpitations.  See HPI Gastrointestinal: Negative for abdominal pain, no bowel changes.  Musculoskeletal: Negative for gait problem or joint swelling.  Skin: Negative for rash.  Neurological: Negative for dizziness or headache.  No other specific complaints in a complete review of systems (except as listed in HPI above).  Objective  Vitals:   03/18/17 1014  BP: 122/84  Pulse: 100  Resp: 16  Temp: 98.1 F (36.7 C)  TempSrc: Oral  SpO2: 97%  Weight: 143 lb 6.4 oz (65 kg)  Height: 5' (1.524 m)   Body mass index is 28.01 kg/m.  Nursing Note and Vital Signs reviewed.  Physical Exam  Constitutional: Patient appears well-developed and well-nourished.  No distress.  HEENT: head atraumatic, normocephalic Cardiovascular: Normal rate, regular rhythm, S1/S2 present.  No murmur or rub heard. Mild RLE edema, mild to moderate LLE edema - non-pitting bilaterally. Pulmonary/Chest: Effort normal and breath sounds clear. No respiratory distress or retractions. Psychiatric: Patient has a normal mood and affect. behavior is normal. Judgment and thought content normal.  Recent Results (from the past 2160 hour(s))  CBC with Differential/Platelet     Status: None   Collection Time: 01/28/17  3:59 PM  Result Value Ref Range   WBC 4.9 3.8 - 10.8 K/uL   RBC 4.27 3.80 - 5.10 MIL/uL   Hemoglobin 12.3 11.7 - 15.5 g/dL   HCT 37.9 35.0 - 45.0 %   MCV 88.8 80.0 - 100.0 fL   MCH 28.8 27.0 - 33.0 pg   MCHC 32.5 32.0 - 36.0 g/dL   RDW 14.8 11.0 - 15.0 %   Platelets 279 140 - 400 K/uL   MPV 10.0 7.5 - 12.5 fL   Neutro Abs 2,842 1,500 - 7,800 cells/uL   Lymphs Abs 1,519 850 - 3,900 cells/uL   Monocytes Absolute 392 200 - 950 cells/uL   Eosinophils Absolute 147 15 - 500 cells/uL   Basophils Absolute 0 0 - 200 cells/uL   Neutrophils Relative % 58 %   Lymphocytes Relative 31 %   Monocytes  Relative 8 %   Eosinophils Relative 3 %   Basophils Relative 0 %   Smear Review Criteria for review not met   COMPLETE METABOLIC PANEL WITH GFR     Status: None   Collection Time: 01/28/17  3:59 PM  Result Value Ref Range   Sodium 142 135 - 146 mmol/L   Potassium 4.1 3.5 - 5.3 mmol/L   Chloride 105 98 - 110 mmol/L   CO2 26 20 - 31 mmol/L   Glucose, Bld 83 65 - 99 mg/dL   BUN 11 7 - 25 mg/dL   Creat 0.73 0.50 - 0.99 mg/dL    Comment:   For patients > or = 63  years of age: The upper reference limit for Creatinine is approximately 13% higher for people identified as African-American.      Total Bilirubin 0.5 0.2 - 1.2 mg/dL   Alkaline Phosphatase 79 33 - 130 U/L   AST 17 10 - 35 U/L   ALT 11 6 - 29 U/L   Total Protein 7.0 6.1 - 8.1 g/dL   Albumin 4.3 3.6 - 5.1 g/dL   Calcium 9.9 8.6 - 10.4 mg/dL   GFR, Est African American >89 >=60 mL/min   GFR, Est Non African American 88 >=60 mL/min  TSH     Status: None   Collection Time: 01/28/17  3:59 PM  Result Value Ref Range   TSH 1.88 mIU/L    Comment:   Reference Range   > or = 20 Years  0.40-4.50   Pregnancy Range First trimester  0.26-2.66 Second trimester 0.55-2.73 Third trimester  0.43-2.91     Vitamin B12     Status: None   Collection Time: 01/28/17  3:59 PM  Result Value Ref Range   Vitamin B-12 687 200 - 1,100 pg/mL  VITAMIN D 25 Hydroxy (Vit-D Deficiency, Fractures)     Status: Abnormal   Collection Time: 01/28/17  3:59 PM  Result Value Ref Range   Vit D, 25-Hydroxy 29 (L) 30 - 100 ng/mL    Comment: Vitamin D Status           25-OH Vitamin D        Deficiency                <20 ng/mL        Insufficiency         20 - 29 ng/mL        Optimal             > or = 30 ng/mL   For 25-OH Vitamin D testing on patients on D2-supplementation and patients for whom quantitation of D2 and D3 fractions is required, the QuestAssureD 25-OH VIT D, (D2,D3), LC/MS/MS is recommended: order code [id number] (patients > 2 yrs).       Assessment & Plan  1. Bilateral lower extremity edema - hydrochlorothiazide (HYDRODIURIL) 25 MG tablet; Take 1 tablet (25 mg total) by mouth daily.  Dispense: 90 tablet; Refill: 0  2. Benign essential HTN - benazepril (LOTENSIN) 40 MG tablet; Take 1 tablet (40 mg total) by mouth daily.  Dispense: 90 tablet; Refill: 0 - hydrochlorothiazide (HYDRODIURIL) 25 MG tablet; Take 1 tablet (25 mg total) by mouth daily.  Dispense: 90 tablet; Refill: 0  -Red flags and when to present for emergency care or RTC including fever >101.16F, chest pain, shortness of breath, new/worsening/un-resolving symptoms, lightheadedness, near-syncope/syncope reviewed with patient at time of visit. Follow up and care instructions discussed and provided in AVS.  I have reviewed this encounter including the documentation in this note and/or discussed this patient with the Johney Maine, FNP, NP-C. I am certifying that I agree with the content of this note as supervising physician.  Steele Sizer, MD [facility] 03/18/2017, 1:01 PM

## 2017-03-18 NOTE — Patient Instructions (Addendum)
-Check your blood pressure every morning and bring your log to your next appointment.   **-If it is below 110/70, ONLY take your Hydrochlorothiazide and do not take Benazapril.  If it is less than 100/60, do not take either medication.**    -If you have lightheadedness, chest pain, shortness of breath, increased swelling, or feel like you're going to pass out, call our office or call 911.   DASH Eating Plan DASH stands for "Dietary Approaches to Stop Hypertension." The DASH eating plan is a healthy eating plan that has been shown to reduce high blood pressure (hypertension). It may also reduce your risk for type 2 diabetes, heart disease, and stroke. The DASH eating plan may also help with weight loss. What are tips for following this plan? General guidelines  Avoid eating more than 2,300 mg (milligrams) of salt (sodium) a day. If you have hypertension, you may need to reduce your sodium intake to 1,500 mg a day.  Limit alcohol intake to no more than 1 drink a day for nonpregnant women and 2 drinks a day for men. One drink equals 12 oz of beer, 5 oz of wine, or 1 oz of hard liquor.  Work with your health care provider to maintain a healthy body weight or to lose weight. Ask what an ideal weight is for you.  Get at least 30 minutes of exercise that causes your heart to beat faster (aerobic exercise) most days of the week. Activities may include walking, swimming, or biking.  Work with your health care provider or diet and nutrition specialist (dietitian) to adjust your eating plan to your individual calorie needs. Reading food labels  Check food labels for the amount of sodium per serving. Choose foods with less than 5 percent of the Daily Value of sodium. Generally, foods with less than 300 mg of sodium per serving fit into this eating plan.  To find whole grains, look for the word "whole" as the first word in the ingredient list. Shopping  Buy products labeled as "low-sodium" or "no  salt added."  Buy fresh foods. Avoid canned foods and premade or frozen meals. Cooking  Avoid adding salt when cooking. Use salt-free seasonings or herbs instead of table salt or sea salt. Check with your health care provider or pharmacist before using salt substitutes.  Do not fry foods. Cook foods using healthy methods such as baking, boiling, grilling, and broiling instead.  Cook with heart-healthy oils, such as olive, canola, soybean, or sunflower oil. Meal planning   Eat a balanced diet that includes: ? 5 or more servings of fruits and vegetables each day. At each meal, try to fill half of your plate with fruits and vegetables. ? Up to 6-8 servings of whole grains each day. ? Less than 6 oz of lean meat, poultry, or fish each day. A 3-oz serving of meat is about the same size as a deck of cards. One egg equals 1 oz. ? 2 servings of low-fat dairy each day. ? A serving of nuts, seeds, or beans 5 times each week. ? Heart-healthy fats. Healthy fats called Omega-3 fatty acids are found in foods such as flaxseeds and coldwater fish, like sardines, salmon, and mackerel.  Limit how much you eat of the following: ? Canned or prepackaged foods. ? Food that is high in trans fat, such as fried foods. ? Food that is high in saturated fat, such as fatty meat. ? Sweets, desserts, sugary drinks, and other foods with added sugar. ?  Full-fat dairy products.  Do not salt foods before eating.  Try to eat at least 2 vegetarian meals each week.  Eat more home-cooked food and less restaurant, buffet, and fast food.  When eating at a restaurant, ask that your food be prepared with less salt or no salt, if possible. What foods are recommended? The items listed may not be a complete list. Talk with your dietitian about what dietary choices are best for you. Grains Whole-grain or whole-wheat bread. Whole-grain or whole-wheat pasta. Brown rice. [place]. Bulgur. Whole-grain and low-sodium  cereals. Pita bread. Low-fat, low-sodium crackers. Whole-wheat flour tortillas. Vegetables Fresh or frozen vegetables (raw, steamed, roasted, or grilled). Low-sodium or reduced-sodium tomato and vegetable juice. Low-sodium or reduced-sodium tomato sauce and tomato paste. Low-sodium or reduced-sodium canned vegetables. Fruits All fresh, dried, or frozen fruit. Canned fruit in natural juice (without added sugar). Meat and other protein foods Skinless chicken or [place]. Ground chicken or [place]. Pork with fat trimmed off. Fish and seafood. Egg whites. Dried beans, peas, or lentils. Unsalted nuts, nut butters, and seeds. Unsalted canned beans. Lean cuts of beef with fat trimmed off. Low-sodium, lean deli meat. Dairy Low-fat (1%) or fat-free (skim) milk. Fat-free, low-fat, or reduced-fat cheeses. Nonfat, low-sodium ricotta or cottage cheese. Low-fat or nonfat yogurt. Low-fat, low-sodium cheese. Fats and oils Soft margarine without trans fats. Vegetable oil. Low-fat, reduced-fat, or light mayonnaise and salad dressings (reduced-sodium). Canola, safflower, olive, soybean, and sunflower oils. Avocado. Seasoning and other foods Herbs. Spices. Seasoning mixes without salt. Unsalted popcorn and pretzels. Fat-free sweets. What foods are not recommended? The items listed may not be a complete list. Talk with your dietitian about what dietary choices are best for you. Grains Baked goods made with fat, such as croissants, muffins, or some breads. Dry pasta or rice meal packs. Vegetables Creamed or fried vegetables. Vegetables in a cheese sauce. Regular canned vegetables (not low-sodium or reduced-sodium). Regular canned tomato sauce and paste (not low-sodium or reduced-sodium). Regular tomato and vegetable juice (not low-sodium or reduced-sodium). Angie Fava. Olives. Fruits Canned fruit in a light or heavy syrup. Fried fruit. Fruit in cream or butter sauce. Meat and other protein foods Fatty cuts of meat. Ribs.  Fried meat. Berniece Salines. Sausage. Bologna and other processed lunch meats. Salami. Fatback. Hotdogs. Bratwurst. Salted nuts and seeds. Canned beans with added salt. Canned or smoked fish. Whole eggs or egg yolks. Chicken or [place] with skin. Dairy Whole or 2% milk, cream, and half-and-half. Whole or full-fat cream cheese. Whole-fat or sweetened yogurt. Full-fat cheese. Nondairy creamers. Whipped toppings. Processed cheese and cheese spreads. Fats and oils Butter. Stick margarine. Lard. Shortening. Ghee. Bacon fat. Tropical oils, such as coconut, palm kernel, or palm oil. Seasoning and other foods Salted popcorn and pretzels. Onion salt, garlic salt, seasoned salt, table salt, and sea salt. Worcestershire sauce. Tartar sauce. Barbecue sauce. Teriyaki sauce. Soy sauce, including reduced-sodium. Steak sauce. Canned and packaged gravies. Fish sauce. Oyster sauce. Cocktail sauce. Horseradish that you find on the shelf. Ketchup. Mustard. Meat flavorings and tenderizers. Bouillon cubes. Hot sauce and Tabasco sauce. Premade or packaged marinades. Premade or packaged taco seasonings. Relishes. Regular salad dressings. Where to find more information:  National Heart, Lung, and [place]: https://wilson-eaton.com/  American Heart Association: www.heart.org Summary  The DASH eating plan is a healthy eating plan that has been shown to reduce high blood pressure (hypertension). It may also reduce your risk for type 2 diabetes, heart disease, and stroke.  With the DASH eating plan, you  should limit salt (sodium) intake to 2,300 mg a day. If you have hypertension, you may need to reduce your sodium intake to 1,500 mg a day.  When on the DASH eating plan, aim to eat more fresh fruits and vegetables, whole grains, lean proteins, low-fat dairy, and heart-healthy fats.  Work with your health care provider or diet and nutrition specialist (dietitian) to adjust your eating plan to your individual calorie needs. This  information is not intended to replace advice given to you by your health care provider. Make sure you discuss any questions you have with your health care provider. Document Released: 08/26/2011 Document Revised: 08/30/2016 Document Reviewed: 08/30/2016 Elsevier Interactive Patient Education  2017 [employer].  How to Take Your Blood Pressure You can take your blood pressure at home with a machine. You may need to check your blood pressure at home:  To check if you have high blood pressure (hypertension).  To check your blood pressure over time.  To make sure your blood pressure medicine is working.  Supplies needed: You will need a blood pressure machine, or monitor. You can buy one at a drugstore or online. When choosing one:  Choose one with an arm cuff.  Choose one that wraps around your upper arm. Only one finger should fit between your arm and the cuff.  Do not choose one that measures your blood pressure from your wrist or finger.  Your doctor can suggest a monitor. How to prepare Avoid these things for 30 minutes before checking your blood pressure:  Drinking caffeine.  Drinking alcohol.  Eating.  Smoking.  Exercising.  Five minutes before checking your blood pressure:  Pee.  Sit in a dining chair. Avoid sitting in a soft couch or armchair.  Be quiet. Do not talk.  How to take your blood pressure Follow the instructions that came with your machine. If you have a digital blood pressure monitor, these may be the instructions: 1. Sit up straight. 2. Place your feet on the floor. Do not cross your ankles or legs. 3. Rest your left arm at the level of your heart. You may rest it on a table, desk, or chair. 4. Pull up your shirt sleeve. 5. Wrap the blood pressure cuff around the upper part of your left arm. The cuff should be 1 inch (2.5 cm) above your elbow. It is best to wrap the cuff around bare skin. 6. Fit the cuff snugly around your arm. You should be  able to place only one finger between the cuff and your arm. 7. Put the cord inside the groove of your elbow. 8. Press the power button. 9. Sit quietly while the cuff fills with air and loses air. 10. Write down the numbers on the screen. 11. Wait 2-3 minutes and then repeat steps 1-10.  What do the numbers mean? Two numbers make up your blood pressure. The first number is called systolic pressure. The second is called diastolic pressure. An example of a blood pressure reading is "120 over 80" (or 120/80). If you are an adult and do not have a medical condition, use this guide to find out if your blood pressure is normal: Normal  First number: below 120.  Second number: below 80. Elevated  First number: 120-129.  Second number: below 80. Hypertension stage 1  First number: 130-139.  Second number: 80-89. Hypertension stage 2  First number: 140 or above.  Second number: 82 or above. Your blood pressure is above normal even if  only the top or bottom number is above normal. Follow these instructions at home:  Check your blood pressure as often as your doctor tells you to.  Take your monitor to your next doctor's appointment. Your doctor will: ? Make sure you are using it correctly. ? Make sure it is working right.  Make sure you understand what your blood pressure numbers should be.  Tell your doctor if your medicines are causing side effects. Contact a doctor if:  Your blood pressure keeps being high. Get help right away if:  Your first blood pressure number is higher than 180.  Your second blood pressure number is higher than 120. This information is not intended to replace advice given to you by your health care provider. Make sure you discuss any questions you have with your health care provider. Document Released: 08/19/2008 Document Revised: 08/04/2016 Document Reviewed: 02/13/2016 Elsevier Interactive Patient Education  [employer].

## 2017-03-20 ENCOUNTER — Encounter: Payer: Self-pay | Admitting: Family Medicine

## 2017-03-20 DIAGNOSIS — I69391 Dysphagia following cerebral infarction: Secondary | ICD-10-CM | POA: Insufficient documentation

## 2017-03-27 ENCOUNTER — Encounter: Payer: Self-pay | Admitting: Family Medicine

## 2017-03-31 DIAGNOSIS — H401121 Primary open-angle glaucoma, left eye, mild stage: Secondary | ICD-10-CM | POA: Diagnosis not present

## 2017-03-31 DIAGNOSIS — H401112 Primary open-angle glaucoma, right eye, moderate stage: Secondary | ICD-10-CM | POA: Diagnosis not present

## 2017-03-31 DIAGNOSIS — H52223 Regular astigmatism, bilateral: Secondary | ICD-10-CM | POA: Diagnosis not present

## 2017-04-06 DIAGNOSIS — R1310 Dysphagia, unspecified: Secondary | ICD-10-CM | POA: Diagnosis not present

## 2017-04-06 DIAGNOSIS — K219 Gastro-esophageal reflux disease without esophagitis: Secondary | ICD-10-CM | POA: Diagnosis not present

## 2017-04-06 DIAGNOSIS — R0982 Postnasal drip: Secondary | ICD-10-CM | POA: Diagnosis not present

## 2017-04-14 ENCOUNTER — Ambulatory Visit (INDEPENDENT_AMBULATORY_CARE_PROVIDER_SITE_OTHER): Payer: Medicare Other | Admitting: Family Medicine

## 2017-04-14 ENCOUNTER — Encounter: Payer: Self-pay | Admitting: Family Medicine

## 2017-04-14 VITALS — BP 130/70 | HR 93 | Temp 98.1°F | Resp 16 | Ht 60.0 in | Wt 146.0 lb

## 2017-04-14 DIAGNOSIS — K50811 Crohn's disease of both small and large intestine with rectal bleeding: Secondary | ICD-10-CM

## 2017-04-14 DIAGNOSIS — F325 Major depressive disorder, single episode, in full remission: Secondary | ICD-10-CM

## 2017-04-14 DIAGNOSIS — M8580 Other specified disorders of bone density and structure, unspecified site: Secondary | ICD-10-CM | POA: Diagnosis not present

## 2017-04-14 DIAGNOSIS — E039 Hypothyroidism, unspecified: Secondary | ICD-10-CM

## 2017-04-14 DIAGNOSIS — I69359 Hemiplegia and hemiparesis following cerebral infarction affecting unspecified side: Secondary | ICD-10-CM | POA: Diagnosis not present

## 2017-04-14 DIAGNOSIS — I700 Atherosclerosis of aorta: Secondary | ICD-10-CM

## 2017-04-14 DIAGNOSIS — R600 Localized edema: Secondary | ICD-10-CM | POA: Diagnosis not present

## 2017-04-14 DIAGNOSIS — K219 Gastro-esophageal reflux disease without esophagitis: Secondary | ICD-10-CM

## 2017-04-14 DIAGNOSIS — I10 Essential (primary) hypertension: Secondary | ICD-10-CM | POA: Diagnosis not present

## 2017-04-14 DIAGNOSIS — E7800 Pure hypercholesterolemia, unspecified: Secondary | ICD-10-CM

## 2017-04-14 MED ORDER — SERTRALINE HCL 100 MG PO TABS: 100.0000 mg | ORAL_TABLET | Freq: Every day | ORAL | 1 refills | Status: DC

## 2017-04-14 NOTE — Progress Notes (Addendum)
Name: Carolyn Campbell   MRN: [id number]    DOB: [birth year]   Date:04/14/2017       Progress Note  Subjective  Chief Complaint  Chief Complaint  Patient presents with  . Follow-up    3 month recheck  . Heartburn    Protonix not working    HPI  Hypothyroidism: last TSH was 1.88, she has recently started to work on losing weight. Denies fatigue, dry skin, palpitations, or constipation.  Endorses some brittle nails, gained 3lbs since last visit.   Chron's: Last colonoscopy done in 2015.  Had a flare September 2017. Last visit with GI was February 2018 with Dr. Allen Norris and has been doing well since then. No longer taking fosamax, takes mobic very seldom.  Chronic back pain with radiculitis: Has been taking gabapentin as prescribed, pain right now is 0/10 on lower back, described as dull aching and sometimes shoots down left leg, but this has been less since beginning to exercise - doing aerobics. Takes Mobic very seldomly.  S/p CVA with left side weakness: she is on bp medication, statin therapy , but not on aspirin because of Chron's and risk of bleeding. She is right hand dominant. No new changes with left sided weakness.  Atherosclerosis of aorta: found on imaging studies, on statin but unable to take aspirin  Hyperlipidemia: taking statin and denies myalgias, chest pain, or SOB  HTN: Taking HCTZ and Benazapril, brings home BP readings with her and all are <140/90. Denies chest pain, palpitations, shortness of breath.has BLE edema on occasion - has been better with increased dose of HCTZ at last visit.  Dysphagia: she states she seems to choke with saliva when talking or yelling, but also has a dysphagia on right side of throat when she swallows, that has been present for several months now, no weight loss. Episodes more frequent. She has a history of GERD and protonix is not helping - ENT Rx'd at referral appt a few weeks ago. She saw  the ENT a couple of weeks ago and was told that her  throat was red on the right side but better than before. No longer taking fosamax, takes mobic very seldom.  Major depression: she is on disability, but doing well at this time, no crying spells or lack of energy, she is taking Zoloft daily and denies side effects.   Overweight: Gained 3lbs since last visit, she notes this is the heaviest she has ever been and is motivated to lose about 10lbs. Pt began going to the [facility] 3 times a week and has been doing the bike for 30 minutes, and then does different strengthening machines, and sometimes walk the track, also does a group aerobics class.  Osteopenia - States has not taken Fosamax in over a year. Has Bone density ordered, needs to schedule.  Patient Active Problem List   Diagnosis Date Noted  . Dysphagia as late effect of stroke 03/20/2017  . Bilateral lower extremity edema 03/18/2017  . Osteopenia 10/18/2016  . Atherosclerosis of aorta ([place]) 12/19/2015  . Vitreous degeneration 05/21/2015  . Bad memory 05/21/2015  . Chronic ulcerative proctitis ([place]) 05/21/2015  . Glaucoma associated with chamber angle anomalies 05/21/2015  . Dermatitis, eczematoid 05/21/2015  . CD (Crohn's disease) ([place]) 05/21/2015  . H/O malignant neoplasm of breast 05/21/2015  . Crohn's disease of both small and large intestine with rectal bleeding ([place]) 12/04/2014  . Solitary pulmonary nodule 11/07/2012  . 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  Cognitive deficits as late effect of cerebrovascular disease 01/20/2010  . CVA, old, hemiparesis ([place]) 04/28/2009  . Adult hypothyroidism 06/12/2008  . Acid reflux 05/23/2007  . Hypercholesterolemia without hypertriglyceridemia 01/18/2007  . Benign essential HTN 01/18/2007    Past Surgical History:  Procedure Laterality Date  . BREAST BIOPSY Left 2006   POS  . BREAST LUMPECTOMY Left 09/20/2004   positive  . BREAST SURGERY Left    malignant biopsy  . COLONOSCOPY  2015  .  ESOPHAGOGASTRODUODENOSCOPY  2015  . FINGER SURGERY     Right small finger  . [place] STUDY  2015  . TUBAL LIGATION      Family History  Problem Relation Age of Onset  . Heart disease Mother   . Heart attack Mother   . Breast cancer Sister   . Alcohol abuse Father   . Cerebrovascular Accident Father   . Hypertension Unknown   . Diabetes Unknown   . Breast cancer Sister 77    Social History   Social History  . Marital status: Married    Spouse name: N/A  . Number of children: N/A  . Years of education: N/A   Occupational History  . Not on file.   Social History Main Topics  . Smoking status: Never Smoker  . Smokeless tobacco: Never Used  . Alcohol use No  . Drug use: No  . Sexual activity: Yes    Partners: Male   Other Topics Concern  . Not on file   Social History Narrative   Married. One son and one daughter. She is disabled after she had breast cancer and strokes. 2 caffeinated beverages daily.     Current Outpatient Prescriptions:  .  atorvastatin (LIPITOR) 40 MG tablet, Take 1 tablet (40 mg total) by mouth daily., Disp: 90 tablet, Rfl: 1 .  azaTHIOprine (IMURAN) 50 MG tablet, Take 1 tablet (50 mg total) by mouth daily., Disp: 90 tablet, Rfl: 3 .  benazepril (LOTENSIN) 40 MG tablet, Take 1 tablet (40 mg total) by mouth daily., Disp: 90 tablet, Rfl: 0 .  diclofenac sodium (VOLTAREN) 1 % GEL, Apply 2 g topically 4 (four) times daily., Disp: 100 g, Rfl: 0 .  gabapentin (NEURONTIN) 300 MG capsule, TAKE 1 CAPSULE BY MOUTH AT  BEDTIME, Disp: 90 capsule, Rfl: 1 .  hydrochlorothiazide (HYDRODIURIL) 25 MG tablet, Take 1 tablet (25 mg total) by mouth daily., Disp: 90 tablet, Rfl: 0 .  latanoprost (XALATAN) 0.005 % ophthalmic solution, Place 1 drop into both eyes at bedtime. , Disp: , Rfl:  .  levothyroxine (SYNTHROID, LEVOTHROID) 50 MCG tablet, Take 1 tablet (50 mcg total) by mouth daily., Disp: 90 tablet, Rfl: 1 .  meloxicam (MOBIC) 15 MG tablet, Take 1 tablet  (15 mg total) by mouth daily., Disp: 30 tablet, Rfl: 0 .  Multiple Vitamin (MULTIVITAMIN) tablet, Take 1 tablet by mouth daily., Disp: , Rfl:  .  pantoprazole (PROTONIX) 40 MG tablet, Take 40 mg by mouth daily., Disp: , Rfl:  .  sertraline (ZOLOFT) 100 MG tablet, Take 1 tablet (100 mg total) by mouth daily., Disp: 90 tablet, Rfl: 1 .  valACYclovir (VALTREX) 1000 MG tablet, Take 1 tablet (1,000 mg total) by mouth 2 (two) times daily., Disp: 30 tablet, Rfl: 0  Allergies  Allergen Reactions  . Aspirin Other (See Comments)    She has Chron's and advised to not take it by GI     ROS  Constitutional: Negative for fever or weight change.  Respiratory:  Negative for cough and shortness of breath.   Cardiovascular: Negative for chest pain or palpitations.  Gastrointestinal: Negative for abdominal pain, no bowel changes.  Musculoskeletal: Negative for gait problem or joint swelling.  Skin: Negative for rash.  Neurological: Negative for dizziness or headache.  No other specific complaints in a complete review of systems (except as listed in HPI above).  Objective  Vitals:   04/14/17 1005  BP: 130/70  Pulse: 93  Resp: 16  Temp: 98.1 F (36.7 C)  TempSrc: Oral  SpO2: 97%  Weight: 146 lb (66.2 kg)  Height: 5' (1.524 m)   Body mass index is 28.51 kg/m.  Physical Exam Constitutional: Patient appears well-developed and well-nourished. Overweight. No distress.  HEENT: head atraumatic, normocephalic, pupils equal and reactive to light, neck supple, throat within normal limits Cardiovascular: Normal rate, regular rhythm and normal heart sounds.  No murmur heard. No BLE edema. Pulmonary/Chest: Effort normal and breath sounds normal. No respiratory distress. Abdominal: Soft.  There is no tenderness. Psychiatric: Patient has a normal mood and affect. behavior is normal. Judgment and thought content normal.   Recent Results (from the past 2160 hour(s))  CBC with Differential/Platelet      Status: None   Collection Time: 01/28/17  3:59 PM  Result Value Ref Range   WBC 4.9 3.8 - 10.8 K/uL   RBC 4.27 3.80 - 5.10 MIL/uL   Hemoglobin 12.3 11.7 - 15.5 g/dL   HCT 37.9 35.0 - 45.0 %   MCV 88.8 80.0 - 100.0 fL   MCH 28.8 27.0 - 33.0 pg   MCHC 32.5 32.0 - 36.0 g/dL   RDW 14.8 11.0 - 15.0 %   Platelets 279 140 - 400 K/uL   MPV 10.0 7.5 - 12.5 fL   Neutro Abs 2,842 1,500 - 7,800 cells/uL   Lymphs Abs 1,519 850 - 3,900 cells/uL   Monocytes Absolute 392 200 - 950 cells/uL   Eosinophils Absolute 147 15 - 500 cells/uL   Basophils Absolute 0 0 - 200 cells/uL   Neutrophils Relative % 58 %   Lymphocytes Relative 31 %   Monocytes Relative 8 %   Eosinophils Relative 3 %   Basophils Relative 0 %   Smear Review Criteria for review not met   COMPLETE METABOLIC PANEL WITH GFR     Status: None   Collection Time: 01/28/17  3:59 PM  Result Value Ref Range   Sodium 142 135 - 146 mmol/L   Potassium 4.1 3.5 - 5.3 mmol/L   Chloride 105 98 - 110 mmol/L   CO2 26 20 - 31 mmol/L   Glucose, Bld 83 65 - 99 mg/dL   BUN 11 7 - 25 mg/dL   Creat 0.73 0.50 - 0.99 mg/dL    Comment:   For patients > or = 63 years of age: The upper reference limit for Creatinine is approximately 13% higher for people identified as African-American.      Total Bilirubin 0.5 0.2 - 1.2 mg/dL   Alkaline Phosphatase 79 33 - 130 U/L   AST 17 10 - 35 U/L   ALT 11 6 - 29 U/L   Total Protein 7.0 6.1 - 8.1 g/dL   Albumin 4.3 3.6 - 5.1 g/dL   Calcium 9.9 8.6 - 10.4 mg/dL   GFR, Est African American >89 >=60 mL/min   GFR, Est Non African American 88 >=60 mL/min  TSH     Status: None   Collection Time: 01/28/17  3:59 PM  Result Value  Ref Range   TSH 1.88 mIU/L    Comment:   Reference Range   > or = 20 Years  0.40-4.50   Pregnancy Range First trimester  0.26-2.66 Second trimester 0.55-2.73 Third trimester  0.43-2.91     Vitamin B12     Status: None   Collection Time: 01/28/17  3:59 PM  Result Value Ref Range    Vitamin B-12 687 200 - 1,100 pg/mL  VITAMIN D 25 Hydroxy (Vit-D Deficiency, Fractures)     Status: Abnormal   Collection Time: 01/28/17  3:59 PM  Result Value Ref Range   Vit D, 25-Hydroxy 29 (L) 30 - 100 ng/mL    Comment: Vitamin D Status           25-OH Vitamin D        Deficiency                <20 ng/mL        Insufficiency         20 - 29 ng/mL        Optimal             > or = 30 ng/mL   For 25-OH Vitamin D testing on patients on D2-supplementation and patients for whom quantitation of D2 and D3 fractions is required, the QuestAssureD 25-OH VIT D, (D2,D3), LC/MS/MS is recommended: order code [phone] (patients > 2 yrs).     PHQ2/9: Depression screen [facility] 2/9 01/17/2017 11/10/2016 06/15/2016 12/19/2015 07/07/2015  Decreased Interest 0 0 0 0 0  Down, Depressed, Hopeless 0 0 0 0 0  PHQ - 2 Score 0 0 0 0 0   Fall Risk: Fall Risk  01/17/2017 11/10/2016 06/15/2016 12/19/2015 07/07/2015  Falls in the past year? No No No No No   Assessment & Plan  1. Crohn's disease of both small and large intestine with rectal bleeding ([place]) - Ambulatory referral to Gastroenterology  2. Hypercholesterolemia without hypertriglyceridemia - Lipid panel  3. Atherosclerosis of aorta ([facility]) - Lipid panel  4. Bilateral lower extremity edema Continue to elevate feet at night, continue HCTZ  5. Osteopenia, unspecified location Dexa scan per orders.  6. CVA, old, hemiparesis ([place]) Stable, continue medication  7. Benign essential HTN Stable, continue to monitor BP weekly at home, continue medications.  8. Gastroesophageal reflux disease, esophagitis presence not specified Continue follow up with ENT  9. Major depression in remission (HCC) - sertraline (ZOLOFT) 100 MG tablet; Take 1 tablet (100 mg total) by mouth daily.  Dispense: 90 tablet; Refill: 1  10. Adult hypothyroidism Stable  I have reviewed this encounter including the documentation in this note and/or discussed this patient with the  Johney Maine, FNP, NP-C. I am certifying that I agree with the content of this note as supervising physician.  Steele Sizer, MD [facility] 04/14/2017, 11:08 AM

## 2017-04-14 NOTE — Patient Instructions (Addendum)
Please come  Back fasting to have labs done. Please call the ENT doctor regarding your Protonix and reflux.  We will let you know when your Dexa scan is scheduled. Keep exercising, try to avoid sweets.

## 2017-04-15 ENCOUNTER — Other Ambulatory Visit: Payer: Self-pay | Admitting: Family Medicine

## 2017-04-15 ENCOUNTER — Emergency Department
Admission: EM | Admit: 2017-04-15 | Discharge: 2017-04-15 | Disposition: A | Discharge status: Home / Self Care | Payer: Medicare Other | Attending: Emergency Medicine | Admitting: Emergency Medicine

## 2017-04-15 ENCOUNTER — Telehealth: Payer: Self-pay | Admitting: Family Medicine

## 2017-04-15 ENCOUNTER — Emergency Department: Payer: Medicare Other

## 2017-04-15 ENCOUNTER — Encounter: Payer: Self-pay | Admitting: Emergency Medicine

## 2017-04-15 DIAGNOSIS — E7800 Pure hypercholesterolemia, unspecified: Secondary | ICD-10-CM

## 2017-04-15 DIAGNOSIS — I10 Essential (primary) hypertension: Secondary | ICD-10-CM

## 2017-04-15 DIAGNOSIS — R079 Chest pain, unspecified: Secondary | ICD-10-CM | POA: Diagnosis not present

## 2017-04-15 DIAGNOSIS — J45909 Unspecified asthma, uncomplicated: Secondary | ICD-10-CM | POA: Insufficient documentation

## 2017-04-15 DIAGNOSIS — K2970 Gastritis, unspecified, without bleeding: Secondary | ICD-10-CM | POA: Insufficient documentation

## 2017-04-15 DIAGNOSIS — K2900 Acute gastritis without bleeding: Secondary | ICD-10-CM

## 2017-04-15 DIAGNOSIS — E039 Hypothyroidism, unspecified: Secondary | ICD-10-CM | POA: Diagnosis not present

## 2017-04-15 DIAGNOSIS — Z791 Long term (current) use of non-steroidal anti-inflammatories (NSAID): Secondary | ICD-10-CM | POA: Insufficient documentation

## 2017-04-15 DIAGNOSIS — Z79899 Other long term (current) drug therapy: Secondary | ICD-10-CM | POA: Insufficient documentation

## 2017-04-15 DIAGNOSIS — E038 Other specified hypothyroidism: Secondary | ICD-10-CM

## 2017-04-15 DIAGNOSIS — I700 Atherosclerosis of aorta: Secondary | ICD-10-CM

## 2017-04-15 DIAGNOSIS — K760 Fatty (change of) liver, not elsewhere classified: Secondary | ICD-10-CM | POA: Diagnosis not present

## 2017-04-15 DIAGNOSIS — R600 Localized edema: Secondary | ICD-10-CM

## 2017-04-15 LAB — CBC
HCT: 37.6 % (ref 35.0–47.0)
Hemoglobin: 12.7 g/dL (ref 12.0–16.0)
MCH: 30.1 pg (ref 26.0–34.0)
MCHC: 33.9 g/dL (ref 32.0–36.0)
MCV: 88.9 fL (ref 80.0–100.0)
Platelets: 239 10*3/uL (ref 150–440)
RBC: 4.23 MIL/uL (ref 3.80–5.20)
RDW: 14.5 % (ref 11.5–14.5)
WBC: 5.3 10*3/uL (ref 3.6–11.0)

## 2017-04-15 LAB — BASIC METABOLIC PANEL
Anion gap: 10 (ref 5–15)
BUN: 14 mg/dL (ref 6–20)
CO2: 29 mmol/L (ref 22–32)
Calcium: 9.8 mg/dL (ref 8.9–10.3)
Chloride: 103 mmol/L (ref 101–111)
Creatinine, Ser: 0.76 mg/dL (ref 0.44–1.00)
GFR calc Af Amer: >60 mL/min (ref >60)
GFR calc non Af Amer: >60 mL/min (ref >60)
Glucose, Bld: 124 mg/dL — ABNORMAL HIGH (ref 65–99)
Potassium: 3.3 mmol/L — ABNORMAL LOW (ref 3.5–5.1)
Sodium: 142 mmol/L (ref 135–145)

## 2017-04-15 LAB — HEPATIC FUNCTION PANEL
ALT: 17 U/L (ref 14–54)
AST: 21 U/L (ref 15–41)
Albumin: 4.4 g/dL (ref 3.5–5.0)
Alkaline Phosphatase: 78 U/L (ref 38–126)
Bilirubin, Direct: <0.1 mg/dL — ABNORMAL LOW (ref 0.1–0.5)
Total Bilirubin: 0.7 mg/dL (ref 0.3–1.2)
Total Protein: 7.8 g/dL (ref 6.5–8.1)

## 2017-04-15 LAB — LIPASE, BLOOD: Lipase: 51 U/L (ref 11–51)

## 2017-04-15 LAB — TROPONIN I
Troponin I: <0.03 ng/mL (ref <0.03)
Troponin I: <0.03 ng/mL (ref <0.03)

## 2017-04-15 MED ORDER — SUCRALFATE 1 G PO TABS: 1.0000 g | ORAL_TABLET | Freq: Four times a day (QID) | ORAL | 1 refills | Status: DC

## 2017-04-15 MED ORDER — GI COCKTAIL ~~LOC~~
30.0000 mL | ORAL | Status: AC
  Administered 2017-04-15: 30 mL via ORAL
  Filled 2017-04-15: qty 30

## 2017-04-15 MED ORDER — FAMOTIDINE 20 MG PO TABS
40.0000 mg | ORAL_TABLET | Freq: Once | ORAL | Status: AC
  Administered 2017-04-15: 40 mg via ORAL
  Filled 2017-04-15: qty 2

## 2017-04-15 MED ORDER — FAMOTIDINE 20 MG PO TABS: 20.0000 mg | ORAL_TABLET | Freq: Two times a day (BID) | ORAL | 0 refills | Status: DC

## 2017-04-15 MED ORDER — METOCLOPRAMIDE HCL 10 MG PO TABS: 10.0000 mg | ORAL_TABLET | Freq: Four times a day (QID) | ORAL | 0 refills | Status: DC | PRN

## 2017-04-15 NOTE — Telephone Encounter (Signed)
Pt's husband called to state that patient is having much worse heartburn today with fatigue. Advised that patient needs to go to the Emergency Department immediately, preferably by ambulance. He is in agreement.  Advised to call back when ready to schedule close follow up appointment.

## 2017-04-15 NOTE — ED Notes (Signed)
Pt discharged to home.  Family member driving.  Discharge instructions reviewed.  Verbalized understanding.  No questions or concerns at this time.  Teach back verified.  Pt in NAD.  No items left in ED.   

## 2017-04-15 NOTE — ED Triage Notes (Signed)
Pt to ED via POV for chest pain, pt states that she has been having chest pain for over 1 week. Pt states that the pain is worse today than it has been over the past week. Pt saw her PCP yesterday. Pt states that she has hx/o GERD. Pt states that she is having nausea but no other symptoms.

## 2017-04-15 NOTE — ED Provider Notes (Signed)
[facility] Emergency Department Provider Note  ____________________________________________  Time seen: Approximately 7:13 PM  I have reviewed the triage vital signs and the nursing notes.   HISTORY  Chief Complaint Chest Pain    HPI Carolyn Campbell is a 61 y.o. female who complains of chest pain over the lower sternum and epigastrium radiating up to the throat over the past week, intermittent without aggravating or alleviating factors. Not exertional or pleuritic. Not associated with shortness of breath vomiting or diaphoresis. Today seemed worse.  Saw her doctor yesterday without any acute changes. Has been on Protonix for about a month without relief. Pain is described as aching.      Past Medical History:  Diagnosis Date  . Allergic rhinitis   . Asthma   . Breast cancer ([place]) 2006   LT LUMPECTOMY  . Cognitive deficits as late effect of cerebrovascular disease   . Crohn's disease of both small and large intestine with rectal bleeding ([place]) 12/04/2014  . 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 Esophageal reflux   . Glaucoma    vitreous degeneration  . Hyperlipidemia   . Hypertension   . Hypothyroidism   . Occlusion, cerebral artery    NOS w/infarction  . Osteoporosis   . Personal history of radiation therapy 2006   BREAST CA     Patient Active Problem List   Diagnosis Date Noted  . Dysphagia as late effect of stroke 03/20/2017  . Bilateral lower extremity edema 03/18/2017  . Osteopenia 10/18/2016  . Atherosclerosis of aorta ([place]) 12/19/2015  . Vitreous degeneration 05/21/2015  . Bad memory 05/21/2015  . Chronic ulcerative proctitis ([place]) 05/21/2015  . Glaucoma associated with chamber angle anomalies 05/21/2015  . Dermatitis, eczematoid 05/21/2015  . CD (Crohn's disease) ([place]) 05/21/2015  . H/O malignant neoplasm of breast 05/21/2015  . Crohn's disease of both small and large intestine with rectal bleeding ([place])  12/04/2014  . Solitary pulmonary nodule 11/07/2012  . 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 Cognitive deficits as late effect of cerebrovascular disease 01/20/2010  . CVA, old, hemiparesis ([place]) 04/28/2009  . Adult hypothyroidism 06/12/2008  . Acid reflux 05/23/2007  . Hypercholesterolemia without hypertriglyceridemia 01/18/2007  . Benign essential HTN 01/18/2007     Past Surgical History:  Procedure Laterality Date  . BREAST BIOPSY Left 2006   POS  . BREAST LUMPECTOMY Left 09/20/2004   positive  . BREAST SURGERY Left    malignant biopsy  . COLONOSCOPY  2015  . ESOPHAGOGASTRODUODENOSCOPY  2015  . FINGER SURGERY     Right small finger  . [place] STUDY  2015  . TUBAL LIGATION       Prior to Admission medications   Medication Sig Start Date End Date Taking? Authorizing Provider  atorvastatin (LIPITOR) 40 MG tablet Take 1 tablet (40 mg total) by mouth daily. 10/18/16   Steele Sizer, MD  azaTHIOprine (IMURAN) 50 MG tablet Take 1 tablet (50 mg total) by mouth daily. 11/08/16   Lucilla Lame, MD  benazepril (LOTENSIN) 40 MG tablet Take 1 tablet (40 mg total) by mouth daily. 03/18/17   Hubbard Hartshorn, FNP  diclofenac sodium (VOLTAREN) 1 % GEL Apply 2 g topically 4 (four) times daily. 11/10/16   Steele Sizer, MD  famotidine (PEPCID) 20 MG tablet Take 1 tablet (20 mg total) by mouth 2 (two) times daily. 04/15/17   Carrie Mew, MD  gabapentin (NEURONTIN) 300 MG capsule TAKE 1 CAPSULE BY  MOUTH AT  BEDTIME 01/24/17   Steele Sizer, MD  hydrochlorothiazide (HYDRODIURIL) 25 MG tablet Take 1 tablet (25 mg total) by mouth daily. 03/18/17   Hubbard Hartshorn, FNP  latanoprost (XALATAN) 0.005 % ophthalmic solution Place 1 drop into both eyes at bedtime.  04/22/16   [provider]  levothyroxine (SYNTHROID, LEVOTHROID) 50 MCG tablet Take 1 tablet (50 mcg total) by mouth daily. 10/19/16   Steele Sizer, MD  meloxicam (MOBIC) 15 MG tablet Take 1 tablet  (15 mg total) by mouth daily. 11/11/16   Steele Sizer, MD  metoCLOPramide (REGLAN) 10 MG tablet Take 1 tablet (10 mg total) by mouth every 6 (six) hours as needed. 04/15/17   Carrie Mew, MD  Multiple Vitamin (MULTIVITAMIN) tablet Take 1 tablet by mouth daily.    [provider]  pantoprazole (PROTONIX) 40 MG tablet Take 40 mg by mouth daily.    [provider]  sertraline (ZOLOFT) 100 MG tablet Take 1 tablet (100 mg total) by mouth daily. 04/14/17   Hubbard Hartshorn, FNP  sucralfate (CARAFATE) 1 g tablet Take 1 tablet (1 g total) by mouth 4 (four) times daily. 04/15/17   Carrie Mew, MD  valACYclovir (VALTREX) 1000 MG tablet Take 1 tablet (1,000 mg total) by mouth 2 (two) times daily. 02/19/16   Steele Sizer, MD     Allergies Aspirin   Family History  Problem Relation Age of Onset  . Heart disease Mother   . Heart attack Mother   . Breast cancer Sister   . Alcohol abuse Father   . Cerebrovascular Accident Father   . Hypertension Unknown   . Diabetes Unknown   . Breast cancer Sister 39    Social History Social History  Substance Use Topics  . Smoking status: Never Smoker  . Smokeless tobacco: Never Used  . Alcohol use No    Review of Systems  Constitutional:   No fever or chills.  ENT:   No sore throat. No rhinorrhea. Cardiovascular:   Positive as above chest pain without syncope. Respiratory:   No dyspnea or cough. Gastrointestinal:   Negative for abdominal pain, vomiting and diarrhea.  Musculoskeletal:   Negative for focal pain or swelling All other systems reviewed and are negative except as documented above in ROS and HPI.  ____________________________________________   PHYSICAL EXAM:  VITAL SIGNS: ED Triage Vitals  Enc Vitals Group     BP 04/15/17 1559 (!) 153/62     Pulse Rate 04/15/17 1559 96     Resp 04/15/17 1559 12     Temp 04/15/17 1559 99.2 F (37.3 C)     Temp Source 04/15/17 1559 Oral     SpO2 04/15/17 1559 96 %      Weight 04/15/17 1559 146 lb (66.2 kg)     Height 04/15/17 1559 5' (1.524 m)     Head Circumference --      Peak Flow --      Pain Score 04/15/17 1554 6     Pain Loc --      Pain Edu? --      Excl. in [place]? --     Vital signs reviewed, nursing assessments reviewed.   Constitutional:   Alert and oriented. Well appearing and in no distress. Eyes:   No scleral icterus.  EOMI. No nystagmus. No conjunctival pallor. PERRL. ENT   Head:   Normocephalic and atraumatic.   Nose:   No congestion/rhinnorhea.    Mouth/Throat:   MMM, no pharyngeal erythema.  No peritonsillar mass.    Neck:   No meningismus. Full ROM Hematological/Lymphatic/Immunilogical:   No cervical lymphadenopathy. Cardiovascular:   RRR. Symmetric bilateral radial and DP pulses.  No murmurs.  Respiratory:   Normal respiratory effort without tachypnea/retractions. Breath sounds are clear and equal bilaterally. No wheezes/rales/rhonchi. Gastrointestinal:   Soft With right upper quadrant and epigastric tenderness. Non distended. There is no CVA tenderness.  No rebound, rigidity, or guarding. Genitourinary:   deferred Musculoskeletal:   Normal range of motion in all extremities. No joint effusions.  No lower extremity tenderness.  No edema. Neurologic:   Normal speech and language.  Motor grossly intact. No gross focal neurologic deficits are appreciated.  Skin:    Skin is warm, dry and intact. No rash noted.  No petechiae, purpura, or bullae.  ____________________________________________    LABS (pertinent positives/negatives) (all labs ordered are listed, but only abnormal results are displayed) Labs Reviewed  BASIC METABOLIC PANEL - Abnormal; Notable for the following:       Result Value   Potassium 3.3 (*)    Glucose, Bld 124 (*)    All other components within normal limits  HEPATIC FUNCTION PANEL - Abnormal; Notable for the following:    Bilirubin, Direct <0.1 (*)    All other components within normal limits   CBC  TROPONIN I  TROPONIN I  LIPASE, BLOOD   ____________________________________________   EKG  Interpreted by me Normal sinus rhythm rate of 94, normal axis and intervals. Normal QRS ST segments. Isolated T wave inversion in V2 which is nonspecific.  ____________________________________________    RADIOLOGY  Dg Chest 2 View  Result Date: 04/15/2017 CLINICAL DATA:  Centralized chest pain. EXAM: CHEST  2 VIEW COMPARISON:  10/27/2016 FINDINGS: The heart size and mediastinal contours are within normal limits. Both lungs are clear. The visualized skeletal structures are unremarkable. IMPRESSION: No active cardiopulmonary disease. Electronically Signed   By: Kathreen Devoid   On: 04/15/2017 16:33   US Abdomen Limited Ruq  Result Date: 04/15/2017 CLINICAL DATA:  Right upper quadrant pain for 1 week EXAM: ULTRASOUND ABDOMEN LIMITED RIGHT UPPER QUADRANT COMPARISON:  None. FINDINGS: Gallbladder: No gallstones or wall thickening visualized. No sonographic Murphy sign noted by sonographer. Common bile duct: Diameter: 9 mm Liver: Slightly increased in echogenicity consistent with fatty infiltration. IMPRESSION: Fatty infiltration of liver. Mild prominence of the common bile duct. Correlation laboratory values is recommended. Electronically Signed   By: Inez Catalina M.D.   On: 04/15/2017 20:08    ____________________________________________   PROCEDURES Procedures  ____________________________________________   INITIAL IMPRESSION / ASSESSMENT AND PLAN / ED COURSE  Pertinent labs & imaging results that were available during my care of the patient were reviewed by me and considered in my medical decision making (see chart for details).  Patient well appearing no acute distress presents with central chest pain, abdominal tenderness. Review of electronic medical records show she has a history of gastritis and small bowel erosions. We'll treat with antiacids for now. Initial labs unremarkable  added on LFTs ultrasound of the abdomen and a delta troponin given her comorbidities and presentation. If negative and she is suitable for discharge home with aggressive antacid therapy.      ----------------------------------------- 8:11 PM on 04/15/2017 -----------------------------------------  Workup negative. We'll discharge home to follow up with primary care.  ____________________________________________   FINAL CLINICAL IMPRESSION(S) / ED DIAGNOSES  Final diagnoses:  Nonspecific chest pain  Acute gastritis without hemorrhage, unspecified gastritis type  New Prescriptions   FAMOTIDINE (PEPCID) 20 MG TABLET    Take 1 tablet (20 mg total) by mouth 2 (two) times daily.   METOCLOPRAMIDE (REGLAN) 10 MG TABLET    Take 1 tablet (10 mg total) by mouth every 6 (six) hours as needed.   SUCRALFATE (CARAFATE) 1 G TABLET    Take 1 tablet (1 g total) by mouth 4 (four) times daily.     Portions of this note were generated with dragon dictation software. Dictation errors may occur despite best attempts at proofreading.    Carrie Mew, MD 04/15/17 2011

## 2017-04-15 NOTE — Discharge Instructions (Signed)
Your test today were all unremarkable, including labs that look at your heart, liver, and electrolytes. Your ultrasound of the gallbladder is unremarkable and does not show any gallstones. Continue your protonix, take other antacid medicines as prescribed, and follow up with your doctor.  Results for orders placed or performed during the hospital encounter of 03/49/17  Basic metabolic panel  Result Value Ref Range   Sodium 142 135 - 145 mmol/L   Potassium 3.3 (L) 3.5 - 5.1 mmol/L   Chloride 103 101 - 111 mmol/L   CO2 29 22 - 32 mmol/L   Glucose, Bld 124 (H) 65 - 99 mg/dL   BUN 14 6 - 20 mg/dL   Creatinine, Ser 0.76 0.44 - 1.00 mg/dL   Calcium 9.8 8.9 - 10.3 mg/dL   GFR calc non Af Amer >60 >60 mL/min   GFR calc Af Amer >60 >60 mL/min   Anion gap 10 5 - 15  CBC  Result Value Ref Range   WBC 5.3 3.6 - 11.0 K/uL   RBC 4.23 3.80 - 5.20 MIL/uL   Hemoglobin 12.7 12.0 - 16.0 g/dL   HCT 37.6 35.0 - 47.0 %   MCV 88.9 80.0 - 100.0 fL   MCH 30.1 26.0 - 34.0 pg   MCHC 33.9 32.0 - 36.0 g/dL   RDW 14.5 11.5 - 14.5 %   Platelets 239 150 - 440 K/uL  Troponin I  Result Value Ref Range   Troponin I <0.03 <0.03 ng/mL  Troponin I  Result Value Ref Range   Troponin I <0.03 <0.03 ng/mL  Hepatic function panel  Result Value Ref Range   Total Protein 7.8 6.5 - 8.1 g/dL   Albumin 4.4 3.5 - 5.0 g/dL   AST 21 15 - 41 U/L   ALT 17 14 - 54 U/L   Alkaline Phosphatase 78 38 - 126 U/L   Total Bilirubin 0.7 0.3 - 1.2 mg/dL   Bilirubin, Direct <0.1 (L) 0.1 - 0.5 mg/dL   Indirect Bilirubin NOT CALCULATED 0.3 - 0.9 mg/dL  Lipase, blood  Result Value Ref Range   Lipase 51 11 - 51 U/L   Dg Chest 2 View  Result Date: 04/15/2017 CLINICAL DATA:  Centralized chest pain. EXAM: CHEST  2 VIEW COMPARISON:  10/27/2016 FINDINGS: The heart size and mediastinal contours are within normal limits. Both lungs are clear. The visualized skeletal structures are unremarkable. IMPRESSION: No active cardiopulmonary disease.  Electronically Signed   By: Kathreen Devoid   On: 04/15/2017 16:33

## 2017-04-15 NOTE — ED Notes (Signed)
Pt given ginger ale to drink per pt preference.  Pt states that her pain is a little bit better, but not completely relieved.  Pt states that she is ready to go home at this time.  Pt educated to see how she tolerates ginger ale and then if ginger ale is tolerated well that this RN will notify EDP for discharge.

## 2017-04-18 NOTE — Telephone Encounter (Signed)
Done. Thanks.

## 2017-04-18 NOTE — Telephone Encounter (Signed)
Patient requesting refill of Levothyroxine and Atorvastatin to [facility].

## 2017-04-19 ENCOUNTER — Telehealth: Payer: Self-pay | Admitting: Emergency Medicine

## 2017-04-19 ENCOUNTER — Other Ambulatory Visit: Payer: Self-pay | Admitting: Emergency Medicine

## 2017-04-19 ENCOUNTER — Telehealth: Payer: Self-pay | Admitting: Family Medicine

## 2017-04-19 DIAGNOSIS — K219 Gastro-esophageal reflux disease without esophagitis: Secondary | ICD-10-CM

## 2017-04-19 NOTE — Telephone Encounter (Signed)
Please call and ask patient how she is feeling after her visit to the ER. Has she been doing better on the new medications? Does she have an appointment scheduled with the ENT and/or has she heard back regarding her referral to a new GI doctor? If she needs anything, we are here. Please remind her to come in fasting to obtain her lipid panel at her earliest convenience. Thank you!

## 2017-04-19 NOTE — Telephone Encounter (Signed)
Per husband Mariana Single, she is doing a little better, not in as much agony as last week.  No appointment was made with GI or ENT.  Referral to be made

## 2017-04-19 NOTE — Progress Notes (Unsigned)
gastr

## 2017-04-28 ENCOUNTER — Other Ambulatory Visit: Payer: Medicare Other

## 2017-05-06 ENCOUNTER — Other Ambulatory Visit: Payer: Self-pay | Admitting: Family Medicine

## 2017-05-06 DIAGNOSIS — I10 Essential (primary) hypertension: Secondary | ICD-10-CM

## 2017-05-06 DIAGNOSIS — R600 Localized edema: Secondary | ICD-10-CM

## 2017-05-18 ENCOUNTER — Other Ambulatory Visit: Payer: Self-pay

## 2017-05-18 ENCOUNTER — Other Ambulatory Visit
Admission: RE | Admit: 2017-05-18 | Discharge: 2017-05-18 | Disposition: A | Discharge status: Home / Self Care | Payer: Medicare Other | Source: Ambulatory Visit | Attending: Gastroenterology | Admitting: Gastroenterology

## 2017-05-18 ENCOUNTER — Ambulatory Visit (INDEPENDENT_AMBULATORY_CARE_PROVIDER_SITE_OTHER): Payer: Medicare Other | Admitting: Gastroenterology

## 2017-05-18 ENCOUNTER — Encounter: Payer: Self-pay | Admitting: Gastroenterology

## 2017-05-18 VITALS — BP 125/67 | HR 83 | Temp 98.4°F | Ht 61.0 in | Wt 137.6 lb

## 2017-05-18 DIAGNOSIS — K50111 Crohn's disease of large intestine with rectal bleeding: Secondary | ICD-10-CM

## 2017-05-18 DIAGNOSIS — K50811 Crohn's disease of both small and large intestine with rectal bleeding: Secondary | ICD-10-CM

## 2017-05-18 DIAGNOSIS — R1084 Generalized abdominal pain: Secondary | ICD-10-CM

## 2017-05-18 DIAGNOSIS — K921 Melena: Secondary | ICD-10-CM

## 2017-05-18 NOTE — Progress Notes (Signed)
Carolyn Darby, MD [address]  [place]  [place], [place] [address]  Main: [phone]  Fax: [phone]    Gastroenterology Consultation  Referring Provider:     Hubbard Hartshorn, FNP Primary Care Physician:  Carolyn Hartshorn, FNP Primary Gastroenterologist:  Dr. Cephas Campbell Reason for Consultation:     Establish care for Crohn's disease        HPI:   Carolyn Campbell is a 61 y.o. y/o female referred by Dr. Uvaldo Rising, Astrid Divine, FNP  for consultation & management of Crohn's disease. This was diagnosed in 2015, currently on Imuran 50 mg daily. She reports intermittent flareups of her Crohn's disease such as abdominal pain, bloating, rectal bleeding. She received prednisone for flare up. She had 2 flareups within last 1 year. She recently is suffering with constipation, had to take laxatives to have a BM. Her last BM was 3 days ago. She does report some upper abdominal bloating. She denies any weight loss, rectal bleeding, diarrhea, nausea, vomiting. She is not on any prednisone currently. She denies taking NSAIDs.  Crohn's disease classification:  Age: > 40 Location: ileocolonic  Behavior: non stricturing, non penetrating  Perianal: no  IBD diagnosis: 11/2013  Disease course:Crohn's disease in 2015, initial symptoms were abdominal pain, blood in stool and on wiping, bloating. She had a colonoscopy, VCE, EGD at the time of diagnosis. She was initially on mesalamine 4 pills daily, prednisone later switched to Remicade in 11/2014. She took only 5 doses as she could not afford. Then she was switched to Imuran 50 mg daily. She had mild flareups about twice a year.  Extra intestinal manifestations: None  IBD surgical history: None No family history of IBD Imaging:  MRE none CTE none SBFT none  Procedures:  Colonoscopy : 01/28/2005, showed external hemorrhoids only Colonoscopy 12/10/2013, showed normal terminal ileum, biopsies performed. Skipped areas of nonbleeding ulcerative  mucosa were seen in the entire colon, biopsies performed. 4 mm polyp in the sigmoid colon Pathology: Rectal biopsy mild-to-moderate active proctitis with architectural features of chronicity, negative for dysplasia and malignancy. Terminal ileum: Small bowel mucosa with preserved villous architecture. Right colon biopsy: No significant pathologic changes  Upper Endoscopy 12/10/2013, underwent dilation of the esophagus Ampullary biopsy: Normal, gastric biopsy: Intramucosal with erosion and mild chronic gastritis, negative for H. pylori, dysplasia and malignancy  VCE 05/06/2014: Erosions in the distal ileum  IBD medications:  Steroids: Prednisone, budesonide 5-ASA: mesalamine Immunomodulators: AZA currently on 50 mg daily, methotrexate TPMT status unknown Biologics:  Anti TNFs: Remicade monotherapy, received induction followed by 1 maintenance dose in 11/2014 Anti Integrins: Ustekinumab: Tofactinib: Clinical trial:   Past Medical History:  Diagnosis Date  . Allergic rhinitis   . Asthma   . Breast cancer ([facility]) 2006   LT LUMPECTOMY  . Cognitive deficits as late effect of cerebrovascular disease   . Crohn's disease of both small and large intestine with rectal bleeding ([place]) 12/04/2014  . 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 Esophageal reflux   . Glaucoma    vitreous degeneration  . Hyperlipidemia   . Hypertension   . Hypothyroidism   . Occlusion, cerebral artery    NOS w/infarction  . Osteoporosis   . Personal history of radiation therapy 2006   BREAST CA    Past Surgical History:  Procedure Laterality Date  . BREAST BIOPSY Left 2006   POS  . BREAST LUMPECTOMY Left 09/20/2004   positive  . BREAST SURGERY  Left    malignant biopsy  . COLONOSCOPY  2015  . ESOPHAGOGASTRODUODENOSCOPY  2015  . FINGER SURGERY     Right small finger  . [place] STUDY  2015  . TUBAL LIGATION      Prior to Admission medications   Medication Sig Start Date End Date  Taking? Authorizing Provider  atorvastatin (LIPITOR) 40 MG tablet TAKE 1 TABLET BY MOUTH  DAILY 04/18/17   Carolyn Hartshorn, FNP  azaTHIOprine (IMURAN) 50 MG tablet Take 1 tablet (50 mg total) by mouth daily. 11/08/16   Lucilla Lame, MD  benazepril (LOTENSIN) 40 MG tablet TAKE 1 TABLET BY MOUTH  DAILY 05/06/17   Carolyn Hartshorn, FNP  diclofenac sodium (VOLTAREN) 1 % GEL Apply 2 g topically 4 (four) times daily. 11/10/16   Steele Sizer, MD  famotidine (PEPCID) 20 MG tablet Take 1 tablet (20 mg total) by mouth 2 (two) times daily. 04/15/17   Carrie Mew, MD  gabapentin (NEURONTIN) 300 MG capsule TAKE 1 CAPSULE BY MOUTH AT  BEDTIME 01/24/17   Steele Sizer, MD  hydrochlorothiazide (HYDRODIURIL) 25 MG tablet TAKE 1 TABLET BY MOUTH  DAILY 05/06/17   Carolyn Hartshorn, FNP  latanoprost (XALATAN) 0.005 % ophthalmic solution Place 1 drop into both eyes at bedtime.  04/22/16   [provider]  levothyroxine (SYNTHROID, LEVOTHROID) 50 MCG tablet TAKE 1 TABLET BY MOUTH  DAILY 04/18/17   Carolyn Hartshorn, FNP  meloxicam (MOBIC) 15 MG tablet Take 1 tablet (15 mg total) by mouth daily. 11/11/16   Steele Sizer, MD  metoCLOPramide (REGLAN) 10 MG tablet Take 1 tablet (10 mg total) by mouth every 6 (six) hours as needed. 04/15/17   Carrie Mew, MD  Multiple Vitamin (MULTIVITAMIN) tablet Take 1 tablet by mouth daily.    [provider]  pantoprazole (PROTONIX) 40 MG tablet Take 40 mg by mouth daily.    [provider]  sertraline (ZOLOFT) 100 MG tablet Take 1 tablet (100 mg total) by mouth daily. 04/14/17   Carolyn Hartshorn, FNP  sucralfate (CARAFATE) 1 g tablet Take 1 tablet (1 g total) by mouth 4 (four) times daily. 04/15/17   Carrie Mew, MD  valACYclovir (VALTREX) 1000 MG tablet Take 1 tablet (1,000 mg total) by mouth 2 (two) times daily. 02/19/16   Steele Sizer, MD    Family History  Problem Relation Age of Onset  . Heart disease Mother   . Heart attack Mother   . Breast cancer  Sister   . Alcohol abuse Father   . Cerebrovascular Accident Father   . Hypertension Unknown   . Diabetes Unknown   . Breast cancer Sister 18     Social History  Substance Use Topics  . Smoking status: Never Smoker  . Smokeless tobacco: Never Used  . Alcohol use No    Allergies as of 05/18/2017 - Review Complete 04/15/2017  Allergen Reaction Noted  . Aspirin Other (See Comments) 10/18/2016    Review of Systems:    All systems reviewed and negative except where noted in HPI.   Physical Exam:  There were no vitals taken for this visit. No LMP recorded. Patient is postmenopausal.  General:   Alert,  Well-developed, well-nourished, pleasant and cooperative in NAD Head:  Normocephalic and atraumatic. Eyes:  Sclera clear, no icterus.   Conjunctiva pink. Ears:  Normal auditory acuity. Nose:  No deformity, discharge, or lesions. Mouth:  No deformity or lesions,oropharynx pink & moist. Neck:  Supple; no masses or  thyromegaly. Lungs:  Respirations even and unlabored.  Clear throughout to auscultation.   No wheezes, crackles, or rhonchi. No acute distress. Heart:  Regular rate and rhythm; no murmurs, clicks, rubs, or gallops. Abdomen:  Normal bowel sounds.  No bruits.  Soft, non-tender and non-distended without masses, hepatosplenomegaly or hernias noted.  No guarding or rebound tenderness.   Rectal: Nor performed Msk:  Symmetrical without gross deformities. Good, equal movement & strength bilaterally. Pulses:  Normal pulses noted. Extremities:  No clubbing or edema.  No cyanosis. Neurologic:  Alert and oriented x3;  grossly normal neurologically. Skin:  Intact without significant lesions or rashes. No jaundice. Lymph Nodes:  No significant cervical adenopathy. Psych:  Alert and cooperative. Normal mood and affect.  Imaging Studies:   Assessment and Plan:   ANNET MANUKYAN is a 61 y.o. y/o female with Terminal ileal and rectal Crohn's, diagnosed in 2015 currently maintained on  Imuran 50 mg daily with mild intermittent flareups about twice a year needing prednisone. I would like to restage her Crohn's disease with EGD and a colonoscopy, check CRP before giving further recommendations. She will continue Imuran for now.  IBD Health Maintenance  1.TB status: Gold quantiferon negative on 12/06/2014 2. Anemia: Absent 3.Immunizations: Hep A and B not immune, Influenza vaccine received in 05/2016, prevnar not received, pneumovax not received 4.Cancer screening I) Colon cancer/dysplasia surveillance: Schedule colonoscopy II) Cervical cancer: Not addressed III) Skin cancer - counseled about annual skin exam by dermatology and skin protection in summer using sun screen SPF > 50, clothing 5.Bone health Vitamin D status: Normal Bone density testing: Not done 5. Labs: Check CRP 6. Smoking: Never smoked 7. NSAIDs and Antibiotics use: Denies NSAID use and antibiotic use  Follow up in 3 months   Carolyn Darby, MD

## 2017-05-18 NOTE — Patient Instructions (Signed)
1. EGD and colonoscopy 2. Continue imran 50mg  daily 3. Check CRP   Please call our office to speak with my nurse [name] at [phone] during business hours from 8am to 4pm if you have any questions/concerns. During after hours, you will be redirected to on call GI physician. For any emergency please call 911 or go the nearest emergency room.    [name], MD [address]  [place]  [place], [place] [address]  Main: [phone]  Fax: [phone]

## 2017-05-19 ENCOUNTER — Other Ambulatory Visit: Payer: Self-pay | Admitting: Gastroenterology

## 2017-05-19 LAB — HIGH SENSITIVITY CRP: CRP, High Sensitivity: 13.29 mg/L — ABNORMAL HIGH (ref 0.00–3.00)

## 2017-05-19 MED ORDER — BUDESONIDE 3 MG PO CPEP: 9.0000 mg | ORAL_CAPSULE | Freq: Every day | ORAL | 1 refills | Status: DC

## 2017-05-20 ENCOUNTER — Telehealth: Payer: Self-pay | Admitting: Family Medicine

## 2017-05-20 DIAGNOSIS — Z111 Encounter for screening for respiratory tuberculosis: Secondary | ICD-10-CM

## 2017-05-20 NOTE — Telephone Encounter (Signed)
Please call patient and ask her to schedule a nurse visit for as soon as possible to provide vaccinations and lab work per her GI specialist Dr. Marius Ditch due to starting biologic therapy for Crohn disease.  First visit: Gold Quantiferon TB Testing at lab and administer PCV23, Hep A and Hep B Vaccines. 2 Months from first visit: Hep B again 6 months from first visit: Hep B and Hep A again 1 year from first visit: Prevnar 13  Lab testing is ordered and placed at front desk. Please place vaccination orders upon patient's arrival and ensure follow up visits are scheduled at initial visit.  Thanks!

## 2017-05-20 NOTE — Telephone Encounter (Signed)
Spoke with patient's husband and he will relay message to patient, also routed message to [facility] for further follow up

## 2017-05-24 NOTE — Telephone Encounter (Signed)
Patient notified

## 2017-05-24 NOTE — Telephone Encounter (Signed)
Called left message on v/m and home phone number

## 2017-05-25 LAB — QUANTIFERON TB GOLD ASSAY (BLOOD)
Interferon Gamma Release Assay: NEGATIVE
Mitogen-Nil: 5.66 IU/mL
Quantiferon Nil Value: 0.08 IU/mL
Quantiferon Tb Ag Minus Nil Value: <0 IU/mL

## 2017-05-26 ENCOUNTER — Encounter: Payer: Self-pay | Admitting: Family Medicine

## 2017-05-27 NOTE — Anesthesia Preprocedure Evaluation (Addendum)
Anesthesia Evaluation  Patient identified by MRN, date of birth, ID band Patient awake    Reviewed: Allergy & Precautions, H&P , NPO status , Patient's Chart, lab work & pertinent test results, reviewed documented beta blocker date and time   Airway Mallampati: II  TM Distance: >3 FB Neck ROM: full    Dental no notable dental hx.    Pulmonary neg pulmonary ROS, shortness of breath, asthma , COPD,    Pulmonary exam normal breath sounds clear to auscultation       Cardiovascular Exercise Tolerance: Good hypertension, + angina negative cardio ROS   Rhythm:regular Rate:Normal  Recently had episode of chest pain.  ER visit with normal EKG and troponin, thought to be reflux related.   Neuro/Psych CVA negative neurological ROS  negative psych ROS   GI/Hepatic negative GI ROS, Neg liver ROS, PUD, GERD  ,  Endo/Other  negative endocrine ROSHypothyroidism   Renal/GU negative Renal ROS  negative genitourinary   Musculoskeletal  (+) Arthritis ,   Abdominal   Peds  Hematology negative hematology ROS (+)   Anesthesia Other Findings   Reproductive/Obstetrics negative OB ROS                                                             Anesthesia Evaluation    Airway        Dental   Pulmonary           Cardiovascular hypertension,   Recently had episode of chest pain.  ER visit with normal EKG and troponin, thought to be reflux related.   Neuro/Psych    GI/Hepatic   Endo/Other    Renal/GU      Musculoskeletal   Abdominal   Peds  Hematology   Anesthesia Other Findings   Reproductive/Obstetrics                             Anesthesia Physical Anesthesia Plan Anesthesia Quick Evaluation  Anesthesia Physical Anesthesia Plan  ASA: II  Anesthesia Plan: General   Post-op Pain Management:    Induction:   PONV Risk Score and Plan:   Airway  Management Planned:   Additional Equipment:   Intra-op Plan:   Post-operative Plan:   Informed Consent: I have reviewed the patients History and Physical, chart, labs and discussed the procedure including the risks, benefits and alternatives for the proposed anesthesia with the patient or authorized representative who has indicated his/her understanding and acceptance.   Dental Advisory Given  Plan Discussed with: CRNA  Anesthesia Plan Comments:         Anesthesia Quick Evaluation

## 2017-05-31 ENCOUNTER — Telehealth: Payer: Self-pay

## 2017-05-31 NOTE — Telephone Encounter (Signed)
Patient stated that she will wait til after her colonoscopy.  She said she will be alright until then.  Thanks [employer]

## 2017-05-31 NOTE — Telephone Encounter (Signed)
Patient contacted office stated she can not afford medication will cost $500. pledase advise on med change for budesonide.  Thanks [employer]

## 2017-06-02 ENCOUNTER — Ambulatory Visit
Admission: RE | Admit: 2017-06-02 | Discharge: 2017-06-02 | Disposition: A | Discharge status: Home / Self Care | Payer: Medicare Other | Source: Ambulatory Visit | Attending: Family Medicine | Admitting: Family Medicine

## 2017-06-02 DIAGNOSIS — M8580 Other specified disorders of bone density and structure, unspecified site: Secondary | ICD-10-CM | POA: Diagnosis present

## 2017-06-02 DIAGNOSIS — M85851 Other specified disorders of bone density and structure, right thigh: Secondary | ICD-10-CM | POA: Insufficient documentation

## 2017-06-06 NOTE — Discharge Instructions (Signed)
General Anesthesia, Adult, Care After °These instructions provide you with information about caring for yourself after your procedure. Your health care provider may also give you more specific instructions. Your treatment has been planned according to current medical practices, but problems sometimes occur. Call your health care provider if you have any problems or questions after your procedure. °What can I expect after the procedure? °After the procedure, it is common to have: °· Vomiting. °· A sore throat. °· Mental slowness. ° °It is common to feel: °· Nauseous. °· Cold or shivery. °· Sleepy. °· Tired. °· Sore or achy, even in parts of your body where you did not have surgery. ° °Follow these instructions at home: °For at least 24 hours after the procedure: °· Do not: °? Participate in activities where you could fall or become injured. °? Drive. °? Use heavy machinery. °? Drink alcohol. °? Take sleeping pills or medicines that cause drowsiness. °? Make important decisions or sign legal documents. °? Take care of children on your own. °· Rest. °Eating and drinking °· If you vomit, drink water, juice, or soup when you can drink without vomiting. °· Drink enough fluid to keep your urine clear or pale yellow. °· Make sure you have little or no nausea before eating solid foods. °· Follow the diet recommended by your health care provider. °General instructions °· Have a responsible adult stay with you until you are awake and alert. °· Return to your normal activities as told by your health care provider. Ask your health care provider what activities are safe for you. °· Take over-the-counter and prescription medicines only as told by your health care provider. °· If you smoke, do not smoke without supervision. °· Keep all follow-up visits as told by your health care provider. This is important. °Contact a health care provider if: °· You continue to have nausea or vomiting at home, and medicines are not helpful. °· You  cannot drink fluids or start eating again. °· You cannot urinate after 8-12 hours. °· You develop a skin rash. °· You have fever. °· You have increasing redness at the site of your procedure. °Get help right away if: °· You have difficulty breathing. °· You have chest pain. °· You have unexpected bleeding. °· You feel that you are having a life-threatening or urgent problem. °This information is not intended to replace advice given to you by your health care provider. Make sure you discuss any questions you have with your health care provider. °Document Released: 12/13/2000 Document Revised: 02/09/2016 Document Reviewed: 08/21/2015 °Elsevier Interactive Patient Education © 2018 [employer] ° °

## 2017-06-08 ENCOUNTER — Ambulatory Visit: Payer: Medicare Other | Admitting: Anesthesiology

## 2017-06-08 ENCOUNTER — Encounter
Admission: RE | Disposition: A | Discharge status: Home / Self Care | Payer: Self-pay | Source: Ambulatory Visit | Attending: Gastroenterology

## 2017-06-08 ENCOUNTER — Ambulatory Visit
Admission: RE | Admit: 2017-06-08 | Discharge: 2017-06-08 | Disposition: A | Discharge status: Home / Self Care | Payer: Medicare Other | Source: Ambulatory Visit | Attending: Gastroenterology | Admitting: Gastroenterology

## 2017-06-08 DIAGNOSIS — J449 Chronic obstructive pulmonary disease, unspecified: Secondary | ICD-10-CM | POA: Insufficient documentation

## 2017-06-08 DIAGNOSIS — E039 Hypothyroidism, unspecified: Secondary | ICD-10-CM | POA: Diagnosis not present

## 2017-06-08 DIAGNOSIS — K219 Gastro-esophageal reflux disease without esophagitis: Secondary | ICD-10-CM | POA: Insufficient documentation

## 2017-06-08 DIAGNOSIS — K449 Diaphragmatic hernia without obstruction or gangrene: Secondary | ICD-10-CM | POA: Diagnosis not present

## 2017-06-08 DIAGNOSIS — Z8673 Personal history of transient ischemic attack (TIA), and cerebral infarction without residual deficits: Secondary | ICD-10-CM | POA: Insufficient documentation

## 2017-06-08 DIAGNOSIS — E785 Hyperlipidemia, unspecified: Secondary | ICD-10-CM | POA: Insufficient documentation

## 2017-06-08 DIAGNOSIS — K529 Noninfective gastroenteritis and colitis, unspecified: Secondary | ICD-10-CM | POA: Diagnosis not present

## 2017-06-08 DIAGNOSIS — Z79899 Other long term (current) drug therapy: Secondary | ICD-10-CM | POA: Diagnosis not present

## 2017-06-08 DIAGNOSIS — I10 Essential (primary) hypertension: Secondary | ICD-10-CM | POA: Insufficient documentation

## 2017-06-08 DIAGNOSIS — H409 Unspecified glaucoma: Secondary | ICD-10-CM | POA: Diagnosis not present

## 2017-06-08 DIAGNOSIS — Z923 Personal history of irradiation: Secondary | ICD-10-CM | POA: Diagnosis not present

## 2017-06-08 DIAGNOSIS — Z853 Personal history of malignant neoplasm of breast: Secondary | ICD-10-CM | POA: Insufficient documentation

## 2017-06-08 DIAGNOSIS — K5090 Crohn's disease, unspecified, without complications: Secondary | ICD-10-CM | POA: Diagnosis present

## 2017-06-08 DIAGNOSIS — K50111 Crohn's disease of large intestine with rectal bleeding: Secondary | ICD-10-CM | POA: Diagnosis not present

## 2017-06-08 HISTORY — DX: Personal history of urinary calculi: Z87.442

## 2017-06-08 HISTORY — DX: Angina pectoris, unspecified: I20.9

## 2017-06-08 HISTORY — PX: COLONOSCOPY WITH PROPOFOL: SHX5780

## 2017-06-08 HISTORY — DX: Dyspnea, unspecified: R06.00

## 2017-06-08 HISTORY — DX: Unspecified osteoarthritis, unspecified site: M19.90

## 2017-06-08 HISTORY — DX: Chronic obstructive pulmonary disease, unspecified: J44.9

## 2017-06-08 HISTORY — PX: ESOPHAGOGASTRODUODENOSCOPY (EGD) WITH PROPOFOL: SHX5813

## 2017-06-08 SURGERY — COLONOSCOPY WITH PROPOFOL
Anesthesia: General | Wound class: Contaminated

## 2017-06-08 MED ORDER — PHENYLEPHRINE HCL 10 MG/ML IJ SOLN
INTRAMUSCULAR | Status: DC | PRN
  Administered 2017-06-08: 50 ug via INTRAVENOUS

## 2017-06-08 MED ORDER — PROPOFOL 10 MG/ML IV BOLUS
INTRAVENOUS | Status: DC | PRN
  Administered 2017-06-08: 20 mg via INTRAVENOUS
  Administered 2017-06-08: 50 mg via INTRAVENOUS
  Administered 2017-06-08 (×2): 30 mg via INTRAVENOUS
  Administered 2017-06-08 (×2): 20 mg via INTRAVENOUS
  Administered 2017-06-08: 10 mg via INTRAVENOUS
  Administered 2017-06-08: 100 mg via INTRAVENOUS
  Administered 2017-06-08: 40 mg via INTRAVENOUS
  Administered 2017-06-08 (×4): 20 mg via INTRAVENOUS

## 2017-06-08 MED ORDER — OXYCODONE HCL 5 MG PO TABS: 5.0000 mg | ORAL_TABLET | Freq: Once | ORAL | Status: DC | PRN

## 2017-06-08 MED ORDER — LIDOCAINE HCL (CARDIAC) 20 MG/ML IV SOLN
INTRAVENOUS | Status: DC | PRN
  Administered 2017-06-08: 50 mg via INTRAVENOUS

## 2017-06-08 MED ORDER — SODIUM CHLORIDE 0.9 % IV SOLN: INTRAVENOUS | Status: DC

## 2017-06-08 MED ORDER — OXYCODONE HCL 5 MG/5ML PO SOLN: 5.0000 mg | Freq: Once | ORAL | Status: DC | PRN

## 2017-06-08 MED ORDER — GLYCOPYRROLATE 0.2 MG/ML IJ SOLN
INTRAMUSCULAR | Status: DC | PRN
  Administered 2017-06-08: 0.1 mg via INTRAVENOUS

## 2017-06-08 MED ORDER — LACTATED RINGERS IV SOLN
10.0000 mL/h | INTRAVENOUS | Status: DC
  Administered 2017-06-08: 10 mL/h via INTRAVENOUS

## 2017-06-08 MED ORDER — PROMETHAZINE HCL 25 MG/ML IJ SOLN: 6.2500 mg | INTRAMUSCULAR | Status: DC | PRN

## 2017-06-08 MED ORDER — MEPERIDINE HCL 25 MG/ML IJ SOLN: 6.2500 mg | INTRAMUSCULAR | Status: DC | PRN

## 2017-06-08 MED ORDER — FENTANYL CITRATE (PF) 100 MCG/2ML IJ SOLN: 25.0000 ug | INTRAMUSCULAR | Status: DC | PRN

## 2017-06-08 SURGICAL SUPPLY — 35 items
BALLN DILATOR 10-12 8 (BALLOONS)
BALLN DILATOR 12-15 8 (BALLOONS)
BALLN DILATOR 15-18 8 (BALLOONS)
BALLN DILATOR CRE 0-12 8 (BALLOONS)
BALLN DILATOR ESOPH 8 10 CRE (MISCELLANEOUS) IMPLANT
BALLOON DILATOR 12-15 8 (BALLOONS) IMPLANT
BALLOON DILATOR 15-18 8 (BALLOONS) IMPLANT
BALLOON DILATOR CRE 0-12 8 (BALLOONS) IMPLANT
BLOCK BITE 60FR ADLT L/F GRN (MISCELLANEOUS) ×2 IMPLANT
CANISTER SUCT 1200ML W/VALVE (MISCELLANEOUS) ×2 IMPLANT
CLIP HMST 235XBRD CATH ROT (MISCELLANEOUS) IMPLANT
CLIP RESOLUTION 360 11X235 (MISCELLANEOUS)
FCP ESCP3.2XJMB 240X2.8X (MISCELLANEOUS)
FORCEPS BIOP RAD 4 LRG CAP 4 (CUTTING FORCEPS) ×1 IMPLANT
FORCEPS BIOP RJ4 240 W/NDL (MISCELLANEOUS)
FORCEPS ESCP3.2XJMB 240X2.8X (MISCELLANEOUS) IMPLANT
GOWN CVR UNV OPN BCK APRN NK (MISCELLANEOUS) ×2 IMPLANT
GOWN ISOL THUMB LOOP REG UNIV (MISCELLANEOUS) ×4
INJECTOR VARIJECT VIN23 (MISCELLANEOUS) IMPLANT
KIT DEFENDO VALVE AND CONN (KITS) IMPLANT
KIT ENDO PROCEDURE OLY (KITS) ×2 IMPLANT
MARKER SPOT ENDO TATTOO 5ML (MISCELLANEOUS) IMPLANT
PAD GROUND ADULT SPLIT (MISCELLANEOUS) IMPLANT
PROBE APC STR FIRE (PROBE) IMPLANT
RETRIEVER NET PLAT FOOD (MISCELLANEOUS) IMPLANT
RETRIEVER NET ROTH 2.5X230 LF (MISCELLANEOUS) IMPLANT
SNARE SHORT THROW 13M SML OVAL (MISCELLANEOUS) IMPLANT
SNARE SHORT THROW 30M LRG OVAL (MISCELLANEOUS) IMPLANT
SNARE SNG USE RND 15MM (INSTRUMENTS) IMPLANT
SPOT EX ENDOSCOPIC TATTOO (MISCELLANEOUS)
SYR INFLATION 60ML (SYRINGE) IMPLANT
TRAP ETRAP POLY (MISCELLANEOUS) IMPLANT
VARIJECT INJECTOR VIN23 (MISCELLANEOUS)
WATER STERILE IRR 250ML POUR (IV SOLUTION) ×2 IMPLANT
WIRE CRE 18-20MM 8CM F G (MISCELLANEOUS) IMPLANT

## 2017-06-08 NOTE — Transfer of Care (Signed)
Immediate Anesthesia Transfer of Care Note  Patient: Carolyn Campbell  Procedure(s) Performed: Procedure(s): COLONOSCOPY WITH PROPOFOL (N/A) ESOPHAGOGASTRODUODENOSCOPY (EGD) WITH PROPOFOL (N/A)  Patient Location: PACU  Anesthesia Type: General  Level of Consciousness: awake, alert  and patient cooperative  Airway and Oxygen Therapy: Patient Spontanous Breathing and Patient connected to supplemental oxygen  Post-op Assessment: Post-op Vital signs reviewed, Patient's Cardiovascular Status Stable, Respiratory Function Stable, Patent Airway and No signs of Nausea or vomiting  Post-op Vital Signs: Reviewed and stable  Complications: No apparent anesthesia complications

## 2017-06-08 NOTE — Anesthesia Postprocedure Evaluation (Signed)
Anesthesia Post Note  Patient: Carolyn Campbell  Procedure(s) Performed: Procedure(s) (LRB): COLONOSCOPY WITH PROPOFOL (N/A) ESOPHAGOGASTRODUODENOSCOPY (EGD) WITH PROPOFOL (N/A)  Patient location during evaluation: PACU Anesthesia Type: General Level of consciousness: awake and alert Pain management: pain level controlled Vital Signs Assessment: post-procedure vital signs reviewed and stable Respiratory status: spontaneous breathing, nonlabored ventilation, respiratory function stable and patient connected to nasal cannula oxygen Cardiovascular status: blood pressure returned to baseline and stable Postop Assessment: no apparent nausea or vomiting Anesthetic complications: no    Monice Lundy ELAINE

## 2017-06-08 NOTE — Op Note (Signed)
[facility] Gastroenterology Patient Name: Carolyn Campbell Procedure Date: 06/08/2017 10:34 AM MRN: [id number] Account #: [id number] Date of Birth: [birth year] Admit Type: Outpatient Age: 61 Room: [facility] OR ROOM 01 Gender: Female Note Status: Finalized Procedure:            Colonoscopy Indications:          Follow-up of Crohn's disease of the small bowel and                        colon, Disease activity assessment of Crohn's disease                        of the small bowel and colon, Assess therapeutic                        response to therapy of Crohn's disease of the small                        bowel and colon Providers:            Lin Landsman MD, MD Medicines:            Monitored Anesthesia Care Complications:        No immediate complications. Estimated blood loss: None. Procedure:            Pre-Anesthesia Assessment:                       - Prior to the procedure, a History and Physical was                        performed, and patient medications and allergies were                        reviewed. The patient is competent. The risks and                        benefits of the procedure and the sedation options and                        risks were discussed with the patient. All questions                        were answered and informed consent was obtained.                        Patient identification and proposed procedure were                        verified by the physician, the nurse, the                        anesthesiologist, the anesthetist and the technician in                        the pre-procedure area in the procedure room. Mental                        Status Examination: alert and oriented. Airway  Examination: normal oropharyngeal airway and neck                        mobility. Respiratory Examination: clear to                        auscultation. CV Examination: normal. ASA Grade   Assessment: II - A patient with mild systemic disease.                        After reviewing the risks and benefits, the patient was                        deemed in satisfactory condition to undergo the                        procedure. The anesthesia plan was to use monitored                        anesthesia care (MAC). Immediately prior to                        administration of medications, the patient was                        re-assessed for adequacy to receive sedatives. The                        heart rate, respiratory rate, oxygen saturations, blood                        pressure, adequacy of pulmonary ventilation, and                        response to care were monitored throughout the                        procedure. The physical status of the patient was                        re-assessed after the procedure.                       After obtaining informed consent, the colonoscope was                        passed under direct vision. Throughout the procedure,                        the patient's blood pressure, pulse, and oxygen                        saturations were monitored continuously. The Olympus                        CF-HQ190L Colonoscope (S#. [id number]) was introduced                        through the anus and advanced to the 15 cm into the  ileum. The colonoscopy was performed without                        difficulty. The patient tolerated the procedure well.                        The quality of the bowel preparation was evaluated                        using the BBPS [facility] Bowel Preparation Scale) with                        scores of: Right Colon = 3, Transverse Colon = 3 and                        Left Colon = 3 (entire mucosa seen well with no                        residual staining, small fragments of stool or opaque                        liquid). The total BBPS score equals 9. Findings:      The perianal and digital rectal  examinations were normal. Pertinent       negatives include normal sphincter tone and no palpable rectal lesions.      The terminal ileum appeared normal. Biopsies were taken with a cold       forceps for histology.      Two scattered non-bleeding aphthae were found in the ascending colon. No       stigmata of recent bleeding were seen.      The rectum, sigmoid colon, descending colon, transverse colon and cecum       appeared normal. Biopsies were taken with a cold forceps for histology.       Multiple random biopsies were obtained with cold forceps for histology       randomly in the entire colon.      The retroflexed view of the distal rectum and anal verge was normal and       showed no anal or rectal abnormalities. Impression:           - The examined portion of the ileum was normal.                        Biopsied.                       - Aphtha in the ascending colon.                       - The rectum, sigmoid colon, descending colon,                        transverse colon and cecum are normal. Biopsied.                       - The distal rectum and anal verge are normal on                        retroflexion view.                       -  Multiple random biopsies were obtained in the entire                        colon.                       - No endoscopic evidence of active Crohn's disease Recommendation:       - Discharge patient to home.                       - Resume previous diet today.                       - Continue present medications.                       - Await pathology results.                       - Continue imuran 48m daily                       - Manage constipation with miralax daily                       - Return to GI clinic as previously scheduled. Procedure Code(s):    --- Professional ---                       [phone] Colonoscopy, flexible; with biopsy, single or                        multiple Diagnosis Code(s):    --- Professional ---                        K63.89, Other specified diseases of intestine                       K50.80, Crohn's disease of both small and large                        intestine without complications CPT copyright 2016 American Medical Association. All rights reserved. The codes documented in this report are preliminary and upon coder review may  be revised to meet current compliance requirements. Dr. RUlyess MortRLin LandsmanMD, MD 06/08/2017 11:26:13 AM This report has been signed electronically. Number of Addenda: 0 Note Initiated On: 06/08/2017 10:34 AM Scope Withdrawal Time: 0 hours 11 minutes 38 seconds  Total Procedure Duration: 0 hours 17 minutes 7 seconds       [facility]

## 2017-06-08 NOTE — Anesthesia Procedure Notes (Signed)
Procedure Name: MAC Date/Time: 06/08/2017 10:43 AM Performed by: Cameron Ali Pre-anesthesia Checklist: Patient identified, Emergency Drugs available, Suction available, Timeout performed and Patient being monitored Patient Re-evaluated:Patient Re-evaluated prior to induction Oxygen Delivery Method: Nasal cannula Placement Confirmation: positive ETCO2

## 2017-06-08 NOTE — H&P (Signed)
[name], MD [address]  [place]  [place], [place] [address]  Main: [phone]  Fax: [phone] Pager: [phone]  Primary Care Physician:  Hubbard Hartshorn, FNP Primary Gastroenterologist:  Dr. [name]  Pre-Procedure History & Physical: HPI:  Carolyn Campbell is a 61 y.o. female is here for an endoscopy and colonoscopy.   Past Medical History:  Diagnosis Date  . Allergic rhinitis   . Anginal pain ([place])    states MD said it was GERD  . Arthritis   . Asthma   . Breast cancer ([place]) 2006   LT LUMPECTOMY  . Cognitive deficits as late effect of cerebrovascular disease   . COPD (chronic obstructive pulmonary disease) ([place])   . Crohn's disease of both small and large intestine with rectal bleeding ([place]) 12/04/2014  . 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 Dyspnea   . Esophageal reflux   . Glaucoma    vitreous degeneration  . History of kidney stones   . Hyperlipidemia   . Hypertension   . Hypothyroidism   . Occlusion, cerebral artery    NOS w/infarction  . Osteoporosis   . Personal history of radiation therapy 2006   BREAST CA  . Stroke [facility])     Past Surgical History:  Procedure Laterality Date  . BREAST BIOPSY Left 2006   POS  . BREAST LUMPECTOMY Left 09/20/2004   positive  . BREAST SURGERY Left    malignant biopsy  . CARDIAC CATHETERIZATION    . COLONOSCOPY  2015  . ESOPHAGOGASTRODUODENOSCOPY  2015  . FINGER SURGERY     Right small finger  . [place] STUDY  2015  . TUBAL LIGATION      Prior to Admission medications   Medication Sig Start Date End Date Taking? Authorizing Provider  atorvastatin (LIPITOR) 40 MG tablet TAKE 1 TABLET BY MOUTH  DAILY 04/18/17  Yes Hubbard Hartshorn, FNP  azaTHIOprine (IMURAN) 50 MG tablet Take 1 tablet (50 mg total) by mouth daily. 11/08/16  Yes Lucilla Lame, MD  benazepril (LOTENSIN) 40 MG tablet TAKE 1 TABLET BY MOUTH  DAILY 05/06/17  Yes Hubbard Hartshorn, FNP  diclofenac sodium  (VOLTAREN) 1 % GEL Apply 2 g topically 4 (four) times daily. 11/10/16  Yes Sowles, Drue Stager, MD  gabapentin (NEURONTIN) 300 MG capsule TAKE 1 CAPSULE BY MOUTH AT  BEDTIME 01/24/17  Yes Sowles, Drue Stager, MD  hydrochlorothiazide (HYDRODIURIL) 25 MG tablet TAKE 1 TABLET BY MOUTH  DAILY 05/06/17  Yes Hubbard Hartshorn, FNP  latanoprost (XALATAN) 0.005 % ophthalmic solution Place 1 drop into both eyes at bedtime.  04/22/16  Yes [provider]  levothyroxine (SYNTHROID, LEVOTHROID) 50 MCG tablet TAKE 1 TABLET BY MOUTH  DAILY 04/18/17  Yes Hubbard Hartshorn, FNP  Multiple Vitamin (MULTIVITAMIN) tablet Take 1 tablet by mouth daily.   Yes [provider]  pantoprazole (PROTONIX) 40 MG tablet Take 40 mg by mouth daily.   Yes [provider]  sertraline (ZOLOFT) 100 MG tablet Take 1 tablet (100 mg total) by mouth daily. 04/14/17  Yes Hubbard Hartshorn, FNP  sucralfate (CARAFATE) 1 g tablet Take 1 tablet (1 g total) by mouth 4 (four) times daily. 04/15/17  Yes Carrie Mew, MD  valACYclovir (VALTREX) 1000 MG tablet Take 1 tablet (1,000 mg total) by mouth 2 (two) times daily. 02/19/16  Yes Sowles, Drue Stager, MD  budesonide (ENTOCORT EC) 3 MG 24 hr capsule Take 3 capsules (9 mg total)  by mouth daily. Patient not taking: Reported on 05/27/2017 05/19/17 07/18/17  Lin Landsman, MD  famotidine (PEPCID) 20 MG tablet Take 1 tablet (20 mg total) by mouth 2 (two) times daily. Patient not taking: Reported on 05/27/2017 04/15/17   Carrie Mew, MD  meloxicam (MOBIC) 15 MG tablet Take 1 tablet (15 mg total) by mouth daily. Patient not taking: Reported on 05/18/2017 11/11/16   Steele Sizer, MD  metoCLOPramide (REGLAN) 10 MG tablet Take 1 tablet (10 mg total) by mouth every 6 (six) hours as needed. Patient not taking: Reported on 05/18/2017 04/15/17   Carrie Mew, MD    Allergies as of 05/18/2017 - Review Complete 05/18/2017  Allergen Reaction Noted  . Aspirin Other (See Comments) 10/18/2016     Family History  Problem Relation Age of Onset  . Heart disease Mother   . Heart attack Mother   . Breast cancer Sister   . Alcohol abuse Father   . Cerebrovascular Accident Father   . Hypertension Unknown   . Diabetes Unknown   . Breast cancer Sister 61    Social History   Social History  . Marital status: Married    Spouse name: N/A  . Number of children: N/A  . Years of education: N/A   Occupational History  . Not on file.   Social History Main Topics  . Smoking status: Never Smoker  . Smokeless tobacco: Never Used  . Alcohol use No  . Drug use: No  . Sexual activity: Yes    Partners: Male   Other Topics Concern  . Not on file   Social History Narrative   Married. One son and one daughter. She is disabled after she had breast cancer and strokes. 2 caffeinated beverages daily.    Review of Systems: See HPI, otherwise negative ROS  Physical Exam: BP (!) 143/68   Pulse 91   Temp (!) 97.3 F (36.3 C) (Tympanic)   Resp 16   Ht 5\' 1"  (1.549 m)   Wt 133 lb (60.3 kg)   SpO2 100%   BMI 25.13 kg/m  General:   Alert,  pleasant and cooperative in NAD Head:  Normocephalic and atraumatic. Neck:  Supple; no masses or thyromegaly. Lungs:  Clear throughout to auscultation.    Heart:  Regular rate and rhythm. Abdomen:  Soft, nontender and nondistended. Normal bowel sounds, without guarding, and without rebound.   Neurologic:  Alert and  oriented x4;  grossly normal neurologically.  Impression/Plan: Carolyn Campbell is here for an endoscopy and colonoscopy to be performed for crohn's disease  Risks, benefits, limitations, and alternatives regarding  endoscopy and colonoscopy have been reviewed with the patient.  Questions have been answered.  All parties agreeable.   [name], MD  06/08/2017, 9:38 AM

## 2017-06-08 NOTE — Op Note (Signed)
[facility] Gastroenterology Patient Name: Carolyn Campbell Procedure Date: 06/08/2017 10:34 AM MRN: [id number] Account #: [id number] Date of Birth: 09-Dec-1955 Admit Type: Outpatient Age: 61 Room: [facility] OR ROOM 01 Gender: Female Note Status: Finalized Procedure:            Upper GI endoscopy Indications:          h/o crohn's Providers:            Lin Landsman MD, MD Referring MD:         Bethena Roys. Sowles, MD (Referring MD) Medicines:            Monitored Anesthesia Care Complications:        No immediate complications. Estimated blood loss: None. Procedure:            Pre-Anesthesia Assessment:                       - Prior to the procedure, a History and Physical was                        performed, and patient medications and allergies were                        reviewed. The patient is competent. The risks and                        benefits of the procedure and the sedation options and                        risks were discussed with the patient. All questions                        were answered and informed consent was obtained.                        Patient identification and proposed procedure were                        verified by the physician, the nurse, the                        anesthesiologist, the anesthetist and the technician in                        the pre-procedure area in the procedure room. Mental                        Status Examination: alert and oriented. Airway                        Examination: normal oropharyngeal airway and neck                        mobility. Respiratory Examination: clear to                        auscultation. CV Examination: normal. Prophylactic                        Antibiotics: The patient does not require prophylactic  antibiotics. Prior Anticoagulants: The patient has                        taken no previous anticoagulant or antiplatelet agents.                        ASA Grade  Assessment: II - A patient with mild systemic                        disease. After reviewing the risks and benefits, the                        patient was deemed in satisfactory condition to undergo                        the procedure. The anesthesia plan was to use monitored                        anesthesia care (MAC). Immediately prior to                        administration of medications, the patient was                        re-assessed for adequacy to receive sedatives. The                        heart rate, respiratory rate, oxygen saturations, blood                        pressure, adequacy of pulmonary ventilation, and                        response to care were monitored throughout the                        procedure. The physical status of the patient was                        re-assessed after the procedure.                       After obtaining informed consent, the endoscope was                        passed under direct vision. Throughout the procedure,                        the patient's blood pressure, pulse, and oxygen                        saturations were monitored continuously. The Olympus                        GIF-HQ190 Endoscope (S#. [id number]) was introduced                        through the mouth, and advanced to the second part of  duodenum. The upper GI endoscopy was accomplished                        without difficulty. The patient tolerated the procedure                        well. Findings:      The first portion of the duodenum and second portion of the duodenum       were normal.      A small hiatal hernia was present.      The entire examined stomach was normal.      The gastroesophageal junction and examined esophagus were normal. Impression:           - Normal first portion of the duodenum and second                        portion of the duodenum.                       - Small hiatal hernia.                       -  Normal stomach.                       - Normal gastroesophageal junction and esophagus.                       - No specimens collected. Recommendation:       - Proceed with colonoscopy Procedure Code(s):    --- Professional ---                       [id number], Esophagogastroduodenoscopy, flexible, transoral;                        diagnostic, including collection of specimen(s) by                        brushing or washing, when performed (separate procedure) Diagnosis Code(s):    --- Professional ---                       K44.9, Diaphragmatic hernia without obstruction or                        gangrene CPT copyright 2016 American Medical Association. All rights reserved. The codes documented in this report are preliminary and upon coder review may  be revised to meet current compliance requirements. Dr. Ulyess Mort Lin Landsman MD, MD 06/08/2017 10:55:30 AM This report has been signed electronically. Number of Addenda: 0 Note Initiated On: 06/08/2017 10:34 AM      [facility]

## 2017-06-09 ENCOUNTER — Encounter: Payer: Self-pay | Admitting: Gastroenterology

## 2017-06-14 ENCOUNTER — Encounter: Payer: Self-pay | Admitting: Gastroenterology

## 2017-06-15 ENCOUNTER — Other Ambulatory Visit: Payer: Self-pay

## 2017-06-17 ENCOUNTER — Telehealth: Payer: Self-pay

## 2017-06-17 ENCOUNTER — Other Ambulatory Visit: Payer: Self-pay

## 2017-06-17 DIAGNOSIS — K50111 Crohn's disease of large intestine with rectal bleeding: Secondary | ICD-10-CM

## 2017-06-17 NOTE — Telephone Encounter (Signed)
LVM for patient to call office so that I can give her the appt for her MR Enterogram scheduled 06/27/17 at [facility] she needs to arrive at 7am .  Nothing to eat or drink 4 hours prior. Thanks [employer]

## 2017-06-17 NOTE — Telephone Encounter (Signed)
-----   Message from Lin Landsman, MD sent at 06/08/2017 11:33 AM EDT ----- Regarding: Schedule MRE Please schedule MRE for Carolyn Campbell  -RV

## 2017-06-21 ENCOUNTER — Ambulatory Visit (INDEPENDENT_AMBULATORY_CARE_PROVIDER_SITE_OTHER): Payer: Medicare Other | Admitting: Gastroenterology

## 2017-06-21 ENCOUNTER — Ambulatory Visit: Payer: Medicare Other | Admitting: Gastroenterology

## 2017-06-21 VITALS — BP 125/70 | Temp 98.4°F | Ht 61.0 in | Wt 130.0 lb

## 2017-06-21 DIAGNOSIS — K5080 Crohn's disease of both small and large intestine without complications: Secondary | ICD-10-CM

## 2017-06-21 MED ORDER — MESALAMINE 1.2 G PO TBEC: 1.2000 g | DELAYED_RELEASE_TABLET | Freq: Two times a day (BID) | ORAL | 1 refills | Status: DC

## 2017-06-21 NOTE — Progress Notes (Signed)
Carolyn Darby, MD [address]  [place]  [place], [place] [address]  Main: [phone]  Fax: [phone]    Gastroenterology Consultation  Referring Provider:     Hubbard Hartshorn, FNP Primary Care Physician:  Hubbard Hartshorn, FNP Primary Gastroenterologist:  Dr. Cephas Campbell Reason for Consultation:     Establish care for Crohn's disease        HPI:   Carolyn Campbell is a 61 y.o. y/o female referred by Dr. Uvaldo Rising, Astrid Divine, FNP  for consultation & management of Crohn's disease. This was diagnosed in 2015, currently on Imuran 50 mg daily. She reports intermittent flareups of her Crohn's disease such as abdominal pain, bloating, rectal bleeding. She received prednisone for flare up. She had 2 flareups within last 1 year. She recently is suffering with constipation, had to take laxatives to have a BM. Her last BM was 3 days ago. She does report some upper abdominal bloating. She denies any weight loss, rectal bleeding, diarrhea, nausea, vomiting. She is not on any prednisone currently. She denies taking NSAIDs.  Follow up 06/21/2017: She underwent EGD and colonoscopy. EGD was normal, colonoscopy revealed normal mucosa but the biopsies showed mild active chronic ileitis and right-sided colitis. No granulomas were seen Doing well overall. Having BM every other day, denies feeling constipated. She does have mild abdominal discomfort after a BM which lasts for about a minute. She continues to take Imuran 50 mg daily. She is trying to lose weight  Crohn's disease classification:  Age: > 40 Location: ileocolonic  Behavior: non stricturing, non penetrating  Perianal: no  IBD diagnosis: 11/2013  Disease course:Crohn's disease in 2015, initial symptoms were abdominal pain, blood in stool and on wiping, bloating. She had a colonoscopy, VCE, EGD at the time of diagnosis. She was initially on mesalamine 4 pills daily, prednisone later switched to Remicade in 11/2014. She took only 5 doses  as she could not afford. Then she was switched to Imuran 50 mg daily. She had mild flareups about twice a year.  Extra intestinal manifestations: None  IBD surgical history: None No family history of IBD Imaging:  MRE none CTE none SBFT none  Procedures:  Colonoscopy : 01/28/2005, showed external hemorrhoids only Colonoscopy 12/10/2013, showed normal terminal ileum, biopsies performed. Skipped areas of nonbleeding ulcerative mucosa were seen in the entire colon, biopsies performed. 4 mm polyp in the sigmoid colon Pathology: Rectal biopsy mild-to-moderate active proctitis with architectural features of chronicity, negative for dysplasia and malignancy. Terminal ileum: Small bowel mucosa with preserved villous architecture. Right colon biopsy: No significant pathologic changes  Upper Endoscopy 12/10/2013, underwent dilation of the esophagus Ampullary biopsy: Normal, gastric biopsy: Intramucosal with erosion and mild chronic gastritis, negative for H. pylori, dysplasia and malignancy  VCE 05/06/2014: Erosions in the distal ileum  EGD 06/08/2017 Normal esophagus, small hiatal hernia, normal stomach and duodenum  Colonoscopy 06/08/2017 The perianal and digital rectal examinations were normal. Pertinent negatives include normal sphincter tone and no palpable rectal Lesions. The terminal ileum appeared normal. Biopsies were taken with a cold forceps for histology. Two scattered non-bleeding aphthae were found in the ascending colon. Rest of the colon and rectum appeared normal. Random biopsies performed   IBD medications:  Steroids: Prednisone, budesonide 5-ASA: mesalamine Immunomodulators: AZA currently on 50 mg daily, methotrexate TPMT status unknown Biologics:  Anti TNFs: Remicade monotherapy, received induction followed by 1 maintenance dose in 11/2014 Anti Integrins: Ustekinumab: Tofactinib: Clinical trial:   Past Medical History:  Diagnosis Date  . Allergic rhinitis   .  Anginal pain ([place])    states MD said it was GERD  . Arthritis   . Asthma   . Breast cancer ([place]) 2006   LT LUMPECTOMY  . Cognitive deficits as late effect of cerebrovascular disease   . COPD (chronic obstructive pulmonary disease) ([place])   . Crohn's disease of both small and large intestine with rectal bleeding ([place]) 12/04/2014  . 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 Dyspnea   . Esophageal reflux   . Glaucoma    vitreous degeneration  . History of kidney stones   . Hyperlipidemia   . Hypertension   . Hypothyroidism   . Occlusion, cerebral artery    NOS w/infarction  . Osteoporosis   . Personal history of radiation therapy 2006   BREAST CA  . Stroke [facility])     Past Surgical History:  Procedure Laterality Date  . BREAST BIOPSY Left 2006   POS  . BREAST LUMPECTOMY Left 09/20/2004   positive  . BREAST SURGERY Left    malignant biopsy  . CARDIAC CATHETERIZATION    . COLONOSCOPY  2015  . COLONOSCOPY WITH PROPOFOL N/A 06/08/2017   Procedure: COLONOSCOPY WITH PROPOFOL;  Surgeon: Lin Landsman, MD;  Location: [facility];  Service: Gastroenterology;  Laterality: N/A;  . ESOPHAGOGASTRODUODENOSCOPY  2015  . ESOPHAGOGASTRODUODENOSCOPY (EGD) WITH PROPOFOL N/A 06/08/2017   Procedure: ESOPHAGOGASTRODUODENOSCOPY (EGD) WITH PROPOFOL;  Surgeon: Lin Landsman, MD;  Location: [facility];  Service: Gastroenterology;  Laterality: N/A;  . FINGER SURGERY     Right small finger  . [place] STUDY  2015  . TUBAL LIGATION      Prior to Admission medications   Medication Sig Start Date End Date Taking? Authorizing Provider  atorvastatin (LIPITOR) 40 MG tablet TAKE 1 TABLET BY MOUTH  DAILY 04/18/17   Hubbard Hartshorn, FNP  azaTHIOprine (IMURAN) 50 MG tablet Take 1 tablet (50 mg total) by mouth daily. 11/08/16   Lucilla Lame, MD  benazepril (LOTENSIN) 40 MG tablet TAKE 1 TABLET BY MOUTH  DAILY 05/06/17   Hubbard Hartshorn, FNP  diclofenac sodium (VOLTAREN)  1 % GEL Apply 2 g topically 4 (four) times daily. 11/10/16   Steele Sizer, MD  famotidine (PEPCID) 20 MG tablet Take 1 tablet (20 mg total) by mouth 2 (two) times daily. 04/15/17   Carrie Mew, MD  gabapentin (NEURONTIN) 300 MG capsule TAKE 1 CAPSULE BY MOUTH AT  BEDTIME 01/24/17   Steele Sizer, MD  hydrochlorothiazide (HYDRODIURIL) 25 MG tablet TAKE 1 TABLET BY MOUTH  DAILY 05/06/17   Hubbard Hartshorn, FNP  latanoprost (XALATAN) 0.005 % ophthalmic solution Place 1 drop into both eyes at bedtime.  04/22/16   [provider]  levothyroxine (SYNTHROID, LEVOTHROID) 50 MCG tablet TAKE 1 TABLET BY MOUTH  DAILY 04/18/17   Hubbard Hartshorn, FNP  meloxicam (MOBIC) 15 MG tablet Take 1 tablet (15 mg total) by mouth daily. 11/11/16   Steele Sizer, MD  metoCLOPramide (REGLAN) 10 MG tablet Take 1 tablet (10 mg total) by mouth every 6 (six) hours as needed. 04/15/17   Carrie Mew, MD  Multiple Vitamin (MULTIVITAMIN) tablet Take 1 tablet by mouth daily.    [provider]  pantoprazole (PROTONIX) 40 MG tablet Take 40 mg by mouth daily.    [provider]  sertraline (ZOLOFT) 100 MG tablet Take 1 tablet (100 mg total) by mouth daily. 04/14/17  Hubbard Hartshorn, FNP  sucralfate (CARAFATE) 1 g tablet Take 1 tablet (1 g total) by mouth 4 (four) times daily. 04/15/17   Carrie Mew, MD  valACYclovir (VALTREX) 1000 MG tablet Take 1 tablet (1,000 mg total) by mouth 2 (two) times daily. 02/19/16   Steele Sizer, MD    Family History  Problem Relation Age of Onset  . Heart disease Mother   . Heart attack Mother   . Breast cancer Sister   . Alcohol abuse Father   . Cerebrovascular Accident Father   . Hypertension Unknown   . Diabetes Unknown   . Breast cancer Sister 15     Social History  Substance Use Topics  . Smoking status: Never Smoker  . Smokeless tobacco: Never Used  . Alcohol use No    Allergies as of 06/21/2017  . (No Active Allergies)    Review of Systems:     All systems reviewed and negative except where noted in HPI.   Physical Exam:  BP 125/70   Temp 98.4 F (36.9 C) (Oral)   Ht 5' 1"  (1.549 m)   Wt 130 lb (59 kg)   BMI 24.56 kg/m  No LMP recorded. Patient is postmenopausal.  General:   Alert,  Well-developed, well-nourished, pleasant and cooperative in NAD Head:  Normocephalic and atraumatic. Eyes:  Sclera clear, no icterus.   Conjunctiva pink. Ears:  Normal auditory acuity. Nose:  No deformity, discharge, or lesions. Mouth:  No deformity or lesions,oropharynx pink & moist. Neck:  Supple; no masses or thyromegaly. Lungs:  Respirations even and unlabored.  Clear throughout to auscultation.   No wheezes, crackles, or rhonchi. No acute distress. Heart:  Regular rate and rhythm; no murmurs, clicks, rubs, or gallops. Abdomen:  Normal bowel sounds.  No bruits.  Soft, non-tender and non-distended without masses, hepatosplenomegaly or hernias noted.  No guarding or rebound tenderness.   Rectal: Nor performed Msk:  Symmetrical without gross deformities. Good, equal movement & strength bilaterally. Pulses:  Normal pulses noted. Extremities:  No clubbing or edema.  No cyanosis. Neurologic:  Alert and oriented x3;  grossly normal neurologically. Skin:  Intact without significant lesions or rashes. No jaundice. Lymph Nodes:  No significant cervical adenopathy. Psych:  Alert and cooperative. Normal mood and affect.  Imaging Studies: No abdominal imaging performed  Assessment and Plan:   Carolyn Campbell is a 61 y.o. y/o female with Terminal ileal and rectal Crohn's, diagnosed in 2015 currently maintained on Imuran 50 mg daily with mild intermittent flareups about twice a year needing prednisone. She does not have any endoscopy evidence of ileocolonic Crohn's disease other than a few scattered aphthae in the ascending colon on colonoscopy in 05/2017 but histology revealed chronic mild active ileitis and right-sided colitis. CRP is significantly  elevated at 13.29. Therefore, I will perform MR enterography to evaluate rest of the small bowel. I tried to give her budesonide but she could not afford high co-pay of $500 for 30 days supply. I will start her on Lialda 2.4 g daily as she is in clinical remission. She will continue Imuran for now. My goal is to eventually get her off Imuran given her age as long as she maintains clinical and endoscopic remission on mesalamine.   IBD Health Maintenance  1.TB status: Gold quantiferon negative on 12/06/2014 2. Anemia: Absent 3.Immunizations: Hep A and B not immune, Influenza vaccine received in 05/2016, prevnar not received, pneumovax not received, seeing PCP tomorrow and recommend about vaccinations. 4.Cancer screening I)  Colon cancer/dysplasia surveillance: Colonoscopy 05/2017, no evidence of dysplasia and no polyps identified. Repeat colonoscopy in 5 years II) Cervical cancer: n/a III) Skin cancer - counseled about annual skin exam by dermatology and skin protection in summer using sun screen SPF > 50, clothing 5.Bone health Vitamin D status: Normal Bone density testing: Not done 5. Labs: Recheck CRP, CBC, LFTs, ferritin in 3 months 6. Smoking: Never smoked 7. NSAIDs and Antibiotics use: Denies NSAID use and antibiotic use  Follow up in 3 months   Carolyn Darby, MD

## 2017-06-22 ENCOUNTER — Ambulatory Visit (INDEPENDENT_AMBULATORY_CARE_PROVIDER_SITE_OTHER): Payer: Medicare Other | Admitting: Family Medicine

## 2017-06-22 ENCOUNTER — Encounter: Payer: Self-pay | Admitting: Family Medicine

## 2017-06-22 VITALS — BP 126/64 | HR 82 | Temp 98.3°F | Resp 18 | Ht 60.24 in | Wt 136.3 lb

## 2017-06-22 DIAGNOSIS — N6019 Diffuse cystic mastopathy of unspecified breast: Secondary | ICD-10-CM

## 2017-06-22 DIAGNOSIS — I700 Atherosclerosis of aorta: Secondary | ICD-10-CM

## 2017-06-22 DIAGNOSIS — I10 Essential (primary) hypertension: Secondary | ICD-10-CM

## 2017-06-22 DIAGNOSIS — Z1231 Encounter for screening mammogram for malignant neoplasm of breast: Secondary | ICD-10-CM

## 2017-06-22 DIAGNOSIS — M5442 Lumbago with sciatica, left side: Secondary | ICD-10-CM

## 2017-06-22 DIAGNOSIS — Z23 Encounter for immunization: Secondary | ICD-10-CM

## 2017-06-22 DIAGNOSIS — E039 Hypothyroidism, unspecified: Secondary | ICD-10-CM | POA: Diagnosis not present

## 2017-06-22 DIAGNOSIS — Z0000 Encounter for general adult medical examination without abnormal findings: Secondary | ICD-10-CM

## 2017-06-22 DIAGNOSIS — K50811 Crohn's disease of both small and large intestine with rectal bleeding: Secondary | ICD-10-CM

## 2017-06-22 DIAGNOSIS — G8929 Other chronic pain: Secondary | ICD-10-CM | POA: Diagnosis not present

## 2017-06-22 DIAGNOSIS — D899 Disorder involving the immune mechanism, unspecified: Secondary | ICD-10-CM | POA: Diagnosis not present

## 2017-06-22 DIAGNOSIS — I69359 Hemiplegia and hemiparesis following cerebral infarction affecting unspecified side: Secondary | ICD-10-CM | POA: Diagnosis not present

## 2017-06-22 DIAGNOSIS — D849 Immunodeficiency, unspecified: Secondary | ICD-10-CM

## 2017-06-22 DIAGNOSIS — M5430 Sciatica, unspecified side: Secondary | ICD-10-CM | POA: Diagnosis not present

## 2017-06-22 DIAGNOSIS — N6012 Diffuse cystic mastopathy of left breast: Secondary | ICD-10-CM | POA: Diagnosis not present

## 2017-06-22 LAB — COMPLETE METABOLIC PANEL WITH GFR
AG Ratio: 1.8 (calc) (ref 1.0–2.5)
ALT: 10 U/L (ref 6–29)
AST: 14 U/L (ref 10–35)
Albumin: 4.3 g/dL (ref 3.6–5.1)
Alkaline phosphatase (APISO): 79 U/L (ref 33–130)
BUN: 14 mg/dL (ref 7–25)
CO2: 31 mmol/L (ref 20–32)
Calcium: 9.9 mg/dL (ref 8.6–10.4)
Chloride: 103 mmol/L (ref 98–110)
Creat: 0.76 mg/dL (ref 0.50–0.99)
GFR, Est African American: 97 mL/min/{1.73_m2} (ref >=60)
GFR, Est Non African American: 83 mL/min/{1.73_m2} (ref >=60)
Globulin: 2.4 g/dL (calc) (ref 1.9–3.7)
Glucose, Bld: 105 mg/dL (ref 65–139)
Potassium: 3.5 mmol/L (ref 3.5–5.3)
Sodium: 142 mmol/L (ref 135–146)
Total Bilirubin: 0.6 mg/dL (ref 0.2–1.2)
Total Protein: 6.7 g/dL (ref 6.1–8.1)

## 2017-06-22 LAB — LIPID PANEL
Cholesterol: 200 mg/dL — ABNORMAL HIGH (ref <200)
HDL: 88 mg/dL (ref >50)
LDL Cholesterol (Calc): 95 mg/dL (calc)
Non-HDL Cholesterol (Calc): 112 mg/dL (calc) (ref <130)
Total CHOL/HDL Ratio: 2.3 (calc) (ref <5.0)
Triglycerides: 79 mg/dL (ref <150)

## 2017-06-22 LAB — TSH: TSH: 1.74 mIU/L (ref 0.40–4.50)

## 2017-06-22 MED ORDER — GABAPENTIN 300 MG PO CAPS: 300.0000 mg | ORAL_CAPSULE | Freq: Every day | ORAL | 1 refills | Status: DC

## 2017-06-22 NOTE — Progress Notes (Signed)
Patient: Carolyn Campbell, Female    DOB: 1956/04/28, 61 y.o.   MRN: [id number]  Visit Date: 06/22/2017  Today's Provider: Loistine Chance, MD   Chief Complaint  Patient presents with  . Annual Exam    Subjective:    HPI Carolyn Campbell is a 61 y.o. female who presents today for her Subsequent Annual Wellness Visit and follow up  Immunosuppression secondary to Chron's disease and medication use: she just had a colonoscopy still has active disease, but no polyps and is seeing Dr. Marius Ditch, she is going to start on a new medications. She denies current abdominal pain, no blood in stools, weight is stable. She still has pain after a bowel movement.   HTN: taking medication and denies side effects, no chest pain or palpitation.  Hypothyroidism: last TSH was normal, she states no longer having constipation, no dry skin.   Chronic back pain with radiculitis: she is back on  gabapentin, pain right now is 0/10 on lower back, described as dull aching and sometimes shoots down left leg  S/p CVA with left side weakness: she is on bp medication, statin therapy , but not on aspirin because of Chron's and risk of bleeding. She is right hand dominant.   Atherosclerosis of aorta: found on imaging studies, on statin but unable to take aspirin  Hyperlipidemia: taking statin and denies myalgia, chest pain or SOB, we will recheck labs   Review of Systems  Constitutional: Negative for fever or weight change.  Respiratory: Negative for cough and shortness of breath.   Cardiovascular: Negative for chest pain or palpitations.  Gastrointestinal: Negative for abdominal pain, no bowel changes.  Musculoskeletal: Negative for gait problem or joint swelling.  Skin: Negative for rash.  Neurological: Negative for dizziness or headache.  No other specific complaints in a complete review of systems (except as listed in HPI above).  Past Medical History:  Diagnosis Date  . Allergic rhinitis   . Anginal pain  ([place])    states MD said it was GERD  . Arthritis   . Asthma   . Breast cancer ([place]) 2006   LT LUMPECTOMY  . Cognitive deficits as late effect of cerebrovascular disease   . COPD (chronic obstructive pulmonary disease) ([place])   . Crohn's disease of both small and large intestine with rectal bleeding ([place]) 12/04/2014  . 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 Dyspnea   . Esophageal reflux   . Glaucoma    vitreous degeneration  . History of kidney stones   . Hyperlipidemia   . Hypertension   . Hypothyroidism   . Occlusion, cerebral artery    NOS w/infarction  . Osteoporosis   . Personal history of radiation therapy 2006   BREAST CA  . Stroke [facility])     Past Surgical History:  Procedure Laterality Date  . BREAST BIOPSY Left 2006   POS  . BREAST LUMPECTOMY Left 09/20/2004   positive  . BREAST SURGERY Left    malignant biopsy  . CARDIAC CATHETERIZATION    . COLONOSCOPY  2015  . COLONOSCOPY WITH PROPOFOL N/A 06/08/2017   Procedure: COLONOSCOPY WITH PROPOFOL;  Surgeon: Lin Landsman, MD;  Location: [facility];  Service: Gastroenterology;  Laterality: N/A;  . ESOPHAGOGASTRODUODENOSCOPY  2015  . ESOPHAGOGASTRODUODENOSCOPY (EGD) WITH PROPOFOL N/A 06/08/2017   Procedure: ESOPHAGOGASTRODUODENOSCOPY (EGD) WITH PROPOFOL;  Surgeon: Lin Landsman, MD;  Location: [facility];  Service: Gastroenterology;  Laterality: N/A;  . FINGER SURGERY  Right small finger  . [place] STUDY  2015  . TUBAL LIGATION      Family History  Problem Relation Age of Onset  . Heart disease Mother   . Heart attack Mother   . Breast cancer Sister   . Alcohol abuse Father   . Cerebrovascular Accident Father   . Hypertension Unknown   . Diabetes Unknown   . Breast cancer Sister 66  . Heart attack Other 44    Social History   Social History  . Marital status: Married    Spouse name: N/A  . Number of children: N/A  . Years of education: N/A    Occupational History  . Not on file.   Social History Main Topics  . Smoking status: Never Smoker  . Smokeless tobacco: Never Used  . Alcohol use No  . Drug use: No  . Sexual activity: Yes    Partners: Male   Other Topics Concern  . Not on file   Social History Narrative   Married. One son and one daughter. She is disabled after she had breast cancer and strokes. 2 caffeinated beverages daily.    Outpatient Encounter Prescriptions as of 06/22/2017  Medication Sig Note  . atorvastatin (LIPITOR) 40 MG tablet TAKE 1 TABLET BY MOUTH  DAILY   . azaTHIOprine (IMURAN) 50 MG tablet Take 1 tablet (50 mg total) by mouth daily.   . benazepril (LOTENSIN) 40 MG tablet TAKE 1 TABLET BY MOUTH  DAILY   . diclofenac sodium (VOLTAREN) 1 % GEL Apply 2 g topically 4 (four) times daily.   Marland Kitchen gabapentin (NEURONTIN) 300 MG capsule Take 1 capsule (300 mg total) by mouth at bedtime.   . hydrochlorothiazide (HYDRODIURIL) 25 MG tablet TAKE 1 TABLET BY MOUTH  DAILY   . latanoprost (XALATAN) 0.005 % ophthalmic solution Place 1 drop into both eyes at bedtime.    Marland Kitchen levothyroxine (SYNTHROID, LEVOTHROID) 50 MCG tablet TAKE 1 TABLET BY MOUTH  DAILY   . mesalamine (LIALDA) 1.2 g EC tablet Take 1 tablet (1.2 g total) by mouth 2 (two) times daily.   . Multiple Vitamin (MULTIVITAMIN) tablet Take 1 tablet by mouth daily.   . pantoprazole (PROTONIX) 40 MG tablet Take 40 mg by mouth daily.   . sertraline (ZOLOFT) 100 MG tablet Take 1 tablet (100 mg total) by mouth daily.   . valACYclovir (VALTREX) 1000 MG tablet Take 1 tablet (1,000 mg total) by mouth 2 (two) times daily. 10/18/2016: PRN  . [DISCONTINUED] gabapentin (NEURONTIN) 300 MG capsule TAKE 1 CAPSULE BY MOUTH AT  BEDTIME   . [DISCONTINUED] famotidine (PEPCID) 20 MG tablet Take 1 tablet (20 mg total) by mouth 2 (two) times daily. (Patient not taking: Reported on 06/22/2017)   . [DISCONTINUED] metoCLOPramide (REGLAN) 10 MG tablet Take 1 tablet (10 mg total) by mouth  every 6 (six) hours as needed. (Patient not taking: Reported on 06/22/2017)   . [DISCONTINUED] sucralfate (CARAFATE) 1 g tablet Take 1 tablet (1 g total) by mouth 4 (four) times daily. (Patient not taking: Reported on 06/22/2017)    No facility-administered encounter medications on file as of 06/22/2017.     No Known Allergies  Care Team Updated in EHR: Yes  Last Vision Exam: 2018  Wears corrective lenses: Yes Last Dental Exam: 2018  Last Hearing Exam: Wears Hearing Aids: No  Functional Ability / Safety Screening 1.  Was the timed Get Up and Go test shorter than 30 seconds?  yes 2.  Does the  patient need help with the phone, transportation, shopping,      preparing meals, housework, laundry, medications, or managing money?  no 3.  Is the patient's home free of loose throw rugs in walkways, pet beds, electrical cords, etc?   yes      Grab bars in the bathroom? yes      Handrails on the stairs?   yes      Adequate lighting?   yes 4.  Has the patient noticed any hearing difficulties?   no  Diet Recall and Exercise Regimen:   She eats a balanced diet - eat more fruit and vegetables, avoiding sweets  Current Exercise Habits: The patient does not participate in regular exercise at present, Type of exercise: Other - see comments, Time (Minutes): 60, Frequency (Times/Week): 3, Weekly Exercise (Minutes/Week): 180, Intensity: Moderate Exercise limited by: Other - see comments (does not go when Chron's flares)  Advanced Care Planning: A voluntary discussion about advance care planning including the explanation and discussion of advance directives.  Discussed health care proxy and Living will, and the patient was able to identify a health care proxy as: her husband and Thermon Leyland - only daughter   Patient does not have a living will at present time. If patient does have living will, I have requested they bring this to the clinic to be scanned in to their chart. Does patient have a HCPOA?    no If  yes, name and contact information:  Does patient have a living will or MOST form?  no - she has forms at home and will fill it out  Cancer Screenings: Skin: no concerns Lung:  Low Dose CT Chest recommended if Age 73-80 years, 30 pack-year currently smoking OR have quit w/in 15years. Patient does not qualify. Breast:  Up to date on Mammogram? Yes  Up to date of Bone Density/Dexa? Yes Colon: yes 2018  Additional Screenings:   Hepatitis C Screening: 12/20/2014 Intimate Partner Violence: no  Objective:   Vitals: BP 126/64 (BP Location: Left Arm, Patient Position: Sitting, Cuff Size: Large)   Pulse 82   Temp 98.3 F (36.8 C) (Oral)   Resp 18   Ht 5' 0.24" (1.53 m)   Wt 136 lb 4.8 oz (61.8 kg)   SpO2 96%   BMI 26.41 kg/m  Body mass index is 26.41 kg/m.  No exam data present  Physical Exam Constitutional: Patient appears well-developed and well-nourished.  No distress.  HEENT: head atraumatic, normocephalic, pupils equal and reactive to light, ear : normal TM bilaterally  neck supple, throat within normal limits Breast: left breast has scar tissue, smaller than right, previous lumpectomy for breast cancer, lumpy breast Cardiovascular: Normal rate, regular rhythm and normal heart sounds.  No murmur heard. No BLE edema. Pulmonary/Chest: Effort normal and breath sounds normal. No respiratory distress. Abdominal: Soft.  There is no tenderness. Psychiatric: Patient has a normal mood and affect. behavior is normal. Judgment and thought content normal.  Cognitive Testing - 6-CIT  Correct? Score   What year is it? yes 0 Yes = 0    No = 4  What month is it? yes 0 Yes = 0    No = 3  Remember:     [address]ia [address], [place], [place]     What time is it? yes 0 Yes = 0    No = 3  Count backwards from 20 to 1 yes 0 Correct = 0    1 error = 2  More than 1 error = 4  Say the months of the year in reverse. yes 0 Correct = 0    1 error = 2   More than 1 error = 4  What address did I ask you  to remember? yes 0 Correct = 0  1 error = 2    2 error = 4    3 error = 6    4 error = 8    All wrong = 10       TOTAL SCORE  0/28   Interpretation:  Normal  Normal (0-7) Abnormal (8-28)   Fall Risk: Fall Risk  06/22/2017 01/17/2017 11/10/2016 06/15/2016 12/19/2015  Falls in the past year? No No No No No    Depression Screen Depression screen [facility] 2/9 06/22/2017 01/17/2017 11/10/2016 06/15/2016 12/19/2015  Decreased Interest 0 0 0 0 0  Down, Depressed, Hopeless 1 0 0 0 0  PHQ - 2 Score 1 0 0 0 0    Recent Results (from the past 2160 hour(s))  Basic metabolic panel     Status: Abnormal   Collection Time: 04/15/17  3:55 PM  Result Value Ref Range   Sodium 142 135 - 145 mmol/L   Potassium 3.3 (L) 3.5 - 5.1 mmol/L   Chloride 103 101 - 111 mmol/L   CO2 29 22 - 32 mmol/L   Glucose, Bld 124 (H) 65 - 99 mg/dL   BUN 14 6 - 20 mg/dL   Creatinine, Ser 0.76 0.44 - 1.00 mg/dL   Calcium 9.8 8.9 - 10.3 mg/dL   GFR calc non Af Amer >60 >60 mL/min   GFR calc Af Amer >60 >60 mL/min    Comment: (NOTE) The eGFR has been calculated using the CKD EPI equation. This calculation has not been validated in all clinical situations. eGFR's persistently <60 mL/min signify possible Chronic Kidney Disease.    Anion gap 10 5 - 15  CBC     Status: None   Collection Time: 04/15/17  3:55 PM  Result Value Ref Range   WBC 5.3 3.6 - 11.0 K/uL   RBC 4.23 3.80 - 5.20 MIL/uL   Hemoglobin 12.7 12.0 - 16.0 g/dL   HCT 37.6 35.0 - 47.0 %   MCV 88.9 80.0 - 100.0 fL   MCH 30.1 26.0 - 34.0 pg   MCHC 33.9 32.0 - 36.0 g/dL   RDW 14.5 11.5 - 14.5 %   Platelets 239 150 - 440 K/uL  Troponin I     Status: None   Collection Time: 04/15/17  3:55 PM  Result Value Ref Range   Troponin I <0.03 <0.03 ng/mL  Troponin I     Status: None   Collection Time: 04/15/17  6:53 PM  Result Value Ref Range   Troponin I <0.03 <0.03 ng/mL  Hepatic function panel     Status: Abnormal   Collection Time: 04/15/17  6:53 PM  Result Value Ref  Range   Total Protein 7.8 6.5 - 8.1 g/dL   Albumin 4.4 3.5 - 5.0 g/dL   AST 21 15 - 41 U/L   ALT 17 14 - 54 U/L   Alkaline Phosphatase 78 38 - 126 U/L   Total Bilirubin 0.7 0.3 - 1.2 mg/dL   Bilirubin, Direct <0.1 (L) 0.1 - 0.5 mg/dL   Indirect Bilirubin NOT CALCULATED 0.3 - 0.9 mg/dL  Lipase, blood     Status: None   Collection Time: 04/15/17  6:53 PM  Result Value Ref Range  Lipase 51 11 - 51 U/L  High sensitivity CRP     Status: Abnormal   Collection Time: 05/18/17  4:03 PM  Result Value Ref Range   CRP, High Sensitivity 13.29 (H) 0.00 - 3.00 mg/L    Comment: (NOTE)         Relative Risk for Future Cardiovascular Event                             Low                 <1.00                             Average       1.00 - 3.00                             High                >3.00 Performed At: [facility] [place], [place] [id number] Lindon Romp MD YI:[id number]   Quantiferon tb gold assay (blood)     Status: None   Collection Time: 05/24/17  9:46 AM  Result Value Ref Range   Interferon Gamma Release Assay NEGATIVE NEGATIVE    Comment: Negative test result. M. tuberculosis complex infection unlikely.   Quantiferon Nil Value 0.08 IU/mL   Mitogen-Nil 5.66 IU/mL   Quantiferon Tb Ag Minus Nil Value <0.00 IU/mL    Comment:   The Nil tube value is used to determine if the patient has a preexisting immune response which could cause a false-positive reading on the test. In order for a test to be valid, the Nil tube must have a value of less than or equal to 8.0 IU/mL.   The mitogen control tube is used to assure the patient has a healthy immune status and also serves as a control for correct blood handling and incubation. It is used to detect false-negative readings. The mitogen tube must have a gamma interferon value of greater than or equal to 0.5 IU/mL higher than the value of the Nil tube.   The TB antigen tube is coated with the M. tuberculosis  specific antigens. For a test to be considered positive, the TB antigen tube value minus the Nil tube value must be greater than or equal to 0.35 IU/mL.   For additional information, please refer to http://education.questdiagnostics.com/faq/QFT (This link is being provided for informational/educational purposes only.)     Assessment & Plan:    1. Medicare annual wellness visit, subsequent  - MM Digital Screening; Future - she has a history of breast cancer and lumpectomy left side  2. Needs flu shot  - Flu Vaccine QUAD 6+ mos PF IM (Fluarix Quad PF)  3. Need for vaccination for pneumococcus  - Pneumococcal conjugate vaccine 13-valent IM  4. Need for hepatitis A vaccination  - Hepatitis A vaccine adult IM  5. Need for hepatitis B vaccination  - Hepatitis B vaccine adult IM  6. Immunosuppressed status ([place])  - Pneumococcal conjugate vaccine 13-valent IM - Hepatitis A vaccine adult IM - Hepatitis B vaccine adult IM  7. Crohn's disease of both small and large intestine with rectal bleeding ([place])   8. CVA, old, hemiparesis ([place])  - Lipid panel  9. Benign essential HTN  - COMPLETE METABOLIC PANEL WITH GFR  10. Adult hypothyroidism  - TSH  11. Chronic bilateral low back pain with left-sided sciatica  - gabapentin (NEURONTIN) 300 MG capsule; Take 1 capsule (300 mg total) by mouth at bedtime.  Dispense: 90 capsule; Refill: 1  12. Sciatic leg pain  - gabapentin (NEURONTIN) 300 MG capsule; Take 1 capsule (300 mg total) by mouth at bedtime.  Dispense: 90 capsule; Refill: 1  13. Atherosclerosis of aorta ([facility])  Continue statin therapy   14. Encounter for screening mammogram for breast cancer  - MM Digital Screening; Future   Exercise Activities and Dietary recommendations  Goals: she wants to travel.  - Discussed health benefits of physical activity, and encouraged her to engage in regular exercise appropriate for her age and condition.   Immunization  History  Administered Date(s) Administered  . Influenza Whole 07/05/2012  . Influenza, Seasonal, Injecte, Preservative Fre 06/08/2011  . Influenza,inj,Quad PF,6+ Mos 07/31/2014, 07/07/2015, 06/15/2016, 06/22/2017  . Influenza-Unspecified 07/21/2013  . Tdap 03/28/2012    Health Maintenance  Topic Date Due  . HIV Screening  09/20/2019 (Originally 11/20/1970)  . MAMMOGRAM  07/14/2017  . PAP SMEAR  12/20/2019  . COLONOSCOPY  06/08/2020  . TETANUS/TDAP  03/28/2022  . INFLUENZA VACCINE  Completed  . Hepatitis C Screening  Completed    Meds ordered this encounter  Medications  . gabapentin (NEURONTIN) 300 MG capsule    Sig: Take 1 capsule (300 mg total) by mouth at bedtime.    Dispense:  90 capsule    Refill:  1    Current Outpatient Prescriptions:  .  atorvastatin (LIPITOR) 40 MG tablet, TAKE 1 TABLET BY MOUTH  DAILY, Disp: 90 tablet, Rfl: 1 .  azaTHIOprine (IMURAN) 50 MG tablet, Take 1 tablet (50 mg total) by mouth daily., Disp: 90 tablet, Rfl: 3 .  benazepril (LOTENSIN) 40 MG tablet, TAKE 1 TABLET BY MOUTH  DAILY, Disp: 90 tablet, Rfl: 1 .  diclofenac sodium (VOLTAREN) 1 % GEL, Apply 2 g topically 4 (four) times daily., Disp: 100 g, Rfl: 0 .  gabapentin (NEURONTIN) 300 MG capsule, Take 1 capsule (300 mg total) by mouth at bedtime., Disp: 90 capsule, Rfl: 1 .  hydrochlorothiazide (HYDRODIURIL) 25 MG tablet, TAKE 1 TABLET BY MOUTH  DAILY, Disp: 90 tablet, Rfl: 1 .  latanoprost (XALATAN) 0.005 % ophthalmic solution, Place 1 drop into both eyes at bedtime. , Disp: , Rfl:  .  levothyroxine (SYNTHROID, LEVOTHROID) 50 MCG tablet, TAKE 1 TABLET BY MOUTH  DAILY, Disp: 90 tablet, Rfl: 1 .  mesalamine (LIALDA) 1.2 g EC tablet, Take 1 tablet (1.2 g total) by mouth 2 (two) times daily., Disp: 180 tablet, Rfl: 1 .  Multiple Vitamin (MULTIVITAMIN) tablet, Take 1 tablet by mouth daily., Disp: , Rfl:  .  pantoprazole (PROTONIX) 40 MG tablet, Take 40 mg by mouth daily., Disp: , Rfl:  .  sertraline  (ZOLOFT) 100 MG tablet, Take 1 tablet (100 mg total) by mouth daily., Disp: 90 tablet, Rfl: 1 .  valACYclovir (VALTREX) 1000 MG tablet, Take 1 tablet (1,000 mg total) by mouth 2 (two) times daily., Disp: 30 tablet, Rfl: 0 Medications Discontinued During This Encounter  Medication Reason  . sucralfate (CARAFATE) 1 g tablet Completed Course  . metoCLOPramide (REGLAN) 10 MG tablet Completed Course  . famotidine (PEPCID) 20 MG tablet Completed Course  . gabapentin (NEURONTIN) 300 MG capsule Reorder    I have personally reviewed and addressed the Medicare Annual Wellness health risk assessment questionnaire and have noted the following in  the patient's chart:  A.         Medical and social history & family history B.         Use of alcohol, tobacco, and illicit drugs  C.         Current medications and supplements D.         Functional and Cognitive ability and status E.         Nutritional status F.         Physical activity G.        Advance directives H.         List of other physicians I.          Hospitalizations, surgeries, and ER visits in previous 12 months J.         [place] such as hearing, vision, cognitive function, and depression L.         Referrals and appointments:  In addition, I have reviewed and discussed with patient certain preventive protocols, quality metrics, and best practice recommendations. A written personalized care plan for preventive services as well as general preventive health recommendations were provided to patient.  See attached scanned questionnaire for additional information.   Follow up in 6 months or prn

## 2017-06-22 NOTE — Patient Instructions (Signed)
Preventive Care 40-64 Years, Female Preventive care refers to lifestyle choices and visits with your health care provider that can promote health and wellness. What does preventive care include?  A yearly physical exam. This is also called an annual well check.  Dental exams once or twice a year.  Routine eye exams. Ask your health care provider how often you should have your eyes checked.  Personal lifestyle choices, including: ? Daily care of your teeth and gums. ? Regular physical activity. ? Eating a healthy diet. ? Avoiding tobacco and drug use. ? Limiting alcohol use. ? Practicing safe sex. ? Taking low-dose aspirin daily starting at age 58. ? Taking vitamin and mineral supplements as recommended by your health care provider. What happens during an annual well check? The services and screenings done by your health care provider during your annual well check will depend on your age, overall health, lifestyle risk factors, and family history of disease. Counseling Your health care provider may ask you questions about your:  Alcohol use.  Tobacco use.  Drug use.  Emotional well-being.  Home and relationship well-being.  Sexual activity.  Eating habits.  Work and work Statistician.  Method of birth control.  Menstrual cycle.  Pregnancy history.  Screening You may have the following tests or measurements:  Height, weight, and BMI.  Blood pressure.  Lipid and cholesterol levels. These may be checked every 5 years, or more frequently if you are over 81 years old.  Skin check.  Lung cancer screening. You may have this screening every year starting at age 78 if you have a 30-pack-year history of smoking and currently smoke or have quit within the past 15 years.  Fecal occult blood test (FOBT) of the stool. You may have this test every year starting at age 65.  Flexible sigmoidoscopy or colonoscopy. You may have a sigmoidoscopy every 5 years or a colonoscopy  every 10 years starting at age 30.  Hepatitis C blood test.  Hepatitis B blood test.  Sexually transmitted disease (STD) testing.  Diabetes screening. This is done by checking your blood sugar (glucose) after you have not eaten for a while (fasting). You may have this done every 1-3 years.  Mammogram. This may be done every 1-2 years. Talk to your health care provider about when you should start having regular mammograms. This may depend on whether you have a family history of breast cancer.  BRCA-related cancer screening. This may be done if you have a family history of breast, ovarian, tubal, or peritoneal cancers.  Pelvic exam and Pap test. This may be done every 3 years starting at age 80. Starting at age 36, this may be done every 5 years if you have a Pap test in combination with an HPV test.  Bone density scan. This is done to screen for osteoporosis. You may have this scan if you are at high risk for osteoporosis.  Discuss your test results, treatment options, and if necessary, the need for more tests with your health care provider. Vaccines Your health care provider may recommend certain vaccines, such as:  Influenza vaccine. This is recommended every year.  Tetanus, diphtheria, and acellular pertussis (Tdap, Td) vaccine. You may need a Td booster every 10 years.  Varicella vaccine. You may need this if you have not been vaccinated.  Zoster vaccine. You may need this after age 5.  Measles, mumps, and rubella (MMR) vaccine. You may need at least one dose of MMR if you were born in  1959 or later. You may also need a second dose.  Pneumococcal 13-valent conjugate (PCV13) vaccine. You may need this if you have certain conditions and were not previously vaccinated.  Pneumococcal polysaccharide (PPSV23) vaccine. You may need one or two doses if you smoke cigarettes or if you have certain conditions.  Meningococcal vaccine. You may need this if you have certain  conditions.  Hepatitis A vaccine. You may need this if you have certain conditions or if you travel or work in places where you may be exposed to hepatitis A.  Hepatitis B vaccine. You may need this if you have certain conditions or if you travel or work in places where you may be exposed to hepatitis B.  Haemophilus influenzae type b (Hib) vaccine. You may need this if you have certain conditions.  Talk to your health care provider about which screenings and vaccines you need and how often you need them. This information is not intended to replace advice given to you by your health care provider. Make sure you discuss any questions you have with your health care provider. Document Released: 10/03/2015 Document Revised: 05/26/2016 Document Reviewed: 07/08/2015 Elsevier Interactive Patient Education  2017 [employer].

## 2017-06-27 ENCOUNTER — Ambulatory Visit
Admission: RE | Admit: 2017-06-27 | Discharge: 2017-06-27 | Disposition: A | Discharge status: Home / Self Care | Payer: Medicare Other | Source: Ambulatory Visit | Attending: Gastroenterology | Admitting: Gastroenterology

## 2017-06-27 DIAGNOSIS — K50111 Crohn's disease of large intestine with rectal bleeding: Secondary | ICD-10-CM | POA: Diagnosis present

## 2017-06-27 DIAGNOSIS — D252 Subserosal leiomyoma of uterus: Secondary | ICD-10-CM | POA: Diagnosis not present

## 2017-06-27 MED ORDER — GADOBENATE DIMEGLUMINE 529 MG/ML IV SOLN
10.0000 mL | Freq: Once | INTRAVENOUS | Status: AC | PRN
  Administered 2017-06-27: 10 mL via INTRAVENOUS

## 2017-07-15 ENCOUNTER — Ambulatory Visit
Admission: RE | Admit: 2017-07-15 | Discharge: 2017-07-15 | Disposition: A | Discharge status: Home / Self Care | Payer: Medicare Other | Source: Ambulatory Visit | Attending: Family Medicine | Admitting: Family Medicine

## 2017-07-15 DIAGNOSIS — Z1231 Encounter for screening mammogram for malignant neoplasm of breast: Secondary | ICD-10-CM | POA: Insufficient documentation

## 2017-07-15 DIAGNOSIS — N6012 Diffuse cystic mastopathy of left breast: Secondary | ICD-10-CM | POA: Insufficient documentation

## 2017-07-15 DIAGNOSIS — Z0000 Encounter for general adult medical examination without abnormal findings: Secondary | ICD-10-CM

## 2017-07-19 ENCOUNTER — Other Ambulatory Visit: Payer: Self-pay | Admitting: Family Medicine

## 2017-07-19 DIAGNOSIS — N6320 Unspecified lump in the left breast, unspecified quadrant: Secondary | ICD-10-CM

## 2017-07-20 ENCOUNTER — Other Ambulatory Visit: Payer: Self-pay | Admitting: Gastroenterology

## 2017-07-20 DIAGNOSIS — K50919 Crohn's disease, unspecified, with unspecified complications: Secondary | ICD-10-CM

## 2017-07-27 ENCOUNTER — Ambulatory Visit
Admission: RE | Admit: 2017-07-27 | Discharge: 2017-07-27 | Disposition: A | Discharge status: Home / Self Care | Payer: Medicare Other | Source: Ambulatory Visit | Attending: Family Medicine | Admitting: Family Medicine

## 2017-07-27 DIAGNOSIS — N6320 Unspecified lump in the left breast, unspecified quadrant: Secondary | ICD-10-CM

## 2017-07-27 DIAGNOSIS — R928 Other abnormal and inconclusive findings on diagnostic imaging of breast: Secondary | ICD-10-CM | POA: Diagnosis not present

## 2017-08-18 ENCOUNTER — Other Ambulatory Visit: Payer: Self-pay

## 2017-08-20 ENCOUNTER — Other Ambulatory Visit: Payer: Self-pay | Admitting: Nurse Practitioner

## 2017-08-29 ENCOUNTER — Other Ambulatory Visit: Payer: Self-pay | Admitting: Family Medicine

## 2017-08-29 DIAGNOSIS — I700 Atherosclerosis of aorta: Secondary | ICD-10-CM

## 2017-08-29 DIAGNOSIS — F325 Major depressive disorder, single episode, in full remission: Secondary | ICD-10-CM

## 2017-08-29 DIAGNOSIS — E7800 Pure hypercholesterolemia, unspecified: Secondary | ICD-10-CM

## 2017-08-29 DIAGNOSIS — E038 Other specified hypothyroidism: Secondary | ICD-10-CM

## 2017-09-29 DIAGNOSIS — H47233 Glaucomatous optic atrophy, bilateral: Secondary | ICD-10-CM | POA: Diagnosis not present

## 2017-09-29 DIAGNOSIS — H401111 Primary open-angle glaucoma, right eye, mild stage: Secondary | ICD-10-CM | POA: Diagnosis not present

## 2017-09-29 DIAGNOSIS — H401122 Primary open-angle glaucoma, left eye, moderate stage: Secondary | ICD-10-CM | POA: Diagnosis not present

## 2017-09-29 DIAGNOSIS — H5203 Hypermetropia, bilateral: Secondary | ICD-10-CM | POA: Diagnosis not present

## 2017-09-29 DIAGNOSIS — H2513 Age-related nuclear cataract, bilateral: Secondary | ICD-10-CM | POA: Diagnosis not present

## 2017-10-05 ENCOUNTER — Other Ambulatory Visit: Payer: Self-pay

## 2017-10-14 ENCOUNTER — Encounter: Payer: Self-pay | Admitting: Gastroenterology

## 2017-10-19 ENCOUNTER — Other Ambulatory Visit: Payer: Self-pay | Admitting: Family Medicine

## 2017-10-19 DIAGNOSIS — M5430 Sciatica, unspecified side: Secondary | ICD-10-CM

## 2017-10-19 DIAGNOSIS — I10 Essential (primary) hypertension: Secondary | ICD-10-CM

## 2017-10-19 DIAGNOSIS — M5442 Lumbago with sciatica, left side: Principal | ICD-10-CM

## 2017-10-19 DIAGNOSIS — R600 Localized edema: Secondary | ICD-10-CM

## 2017-10-19 DIAGNOSIS — G8929 Other chronic pain: Secondary | ICD-10-CM

## 2017-11-29 ENCOUNTER — Other Ambulatory Visit: Payer: Self-pay | Admitting: Family Medicine

## 2017-11-29 DIAGNOSIS — R600 Localized edema: Secondary | ICD-10-CM

## 2017-11-29 DIAGNOSIS — E038 Other specified hypothyroidism: Secondary | ICD-10-CM

## 2017-11-29 DIAGNOSIS — I10 Essential (primary) hypertension: Secondary | ICD-10-CM

## 2017-11-29 DIAGNOSIS — I700 Atherosclerosis of aorta: Secondary | ICD-10-CM

## 2017-11-29 DIAGNOSIS — E7800 Pure hypercholesterolemia, unspecified: Secondary | ICD-10-CM

## 2017-11-29 DIAGNOSIS — F325 Major depressive disorder, single episode, in full remission: Secondary | ICD-10-CM

## 2017-11-29 NOTE — Telephone Encounter (Signed)
Refill request for general medication. Fosamax  Last office visit 06/22/2017  Follow up on 12/21/2017

## 2017-11-30 NOTE — Telephone Encounter (Signed)
90-day supplies provided, however she must return for visit in April as scheduled to obtain additional refills.

## 2017-11-30 NOTE — Telephone Encounter (Signed)
Fosamax not on current med list.

## 2017-12-21 ENCOUNTER — Other Ambulatory Visit: Payer: Self-pay | Admitting: Family Medicine

## 2017-12-21 ENCOUNTER — Encounter: Payer: Self-pay | Admitting: Nurse Practitioner

## 2017-12-21 ENCOUNTER — Ambulatory Visit: Payer: Medicare Other | Admitting: Family Medicine

## 2017-12-21 ENCOUNTER — Ambulatory Visit (INDEPENDENT_AMBULATORY_CARE_PROVIDER_SITE_OTHER): Payer: Medicare Other | Admitting: Nurse Practitioner

## 2017-12-21 VITALS — BP 120/60 | HR 94 | Temp 99.3°F | Resp 16 | Ht 60.0 in | Wt 134.4 lb

## 2017-12-21 DIAGNOSIS — K50811 Crohn's disease of both small and large intestine with rectal bleeding: Secondary | ICD-10-CM

## 2017-12-21 DIAGNOSIS — G8929 Other chronic pain: Secondary | ICD-10-CM

## 2017-12-21 DIAGNOSIS — Z79899 Other long term (current) drug therapy: Secondary | ICD-10-CM

## 2017-12-21 DIAGNOSIS — E039 Hypothyroidism, unspecified: Secondary | ICD-10-CM | POA: Diagnosis not present

## 2017-12-21 DIAGNOSIS — M19041 Primary osteoarthritis, right hand: Secondary | ICD-10-CM

## 2017-12-21 DIAGNOSIS — D899 Disorder involving the immune mechanism, unspecified: Secondary | ICD-10-CM | POA: Diagnosis not present

## 2017-12-21 DIAGNOSIS — K219 Gastro-esophageal reflux disease without esophagitis: Secondary | ICD-10-CM | POA: Diagnosis not present

## 2017-12-21 DIAGNOSIS — M5430 Sciatica, unspecified side: Secondary | ICD-10-CM

## 2017-12-21 DIAGNOSIS — E559 Vitamin D deficiency, unspecified: Secondary | ICD-10-CM

## 2017-12-21 DIAGNOSIS — I700 Atherosclerosis of aorta: Secondary | ICD-10-CM | POA: Diagnosis not present

## 2017-12-21 DIAGNOSIS — E7800 Pure hypercholesterolemia, unspecified: Secondary | ICD-10-CM

## 2017-12-21 DIAGNOSIS — F325 Major depressive disorder, single episode, in full remission: Secondary | ICD-10-CM | POA: Diagnosis not present

## 2017-12-21 DIAGNOSIS — N6019 Diffuse cystic mastopathy of unspecified breast: Secondary | ICD-10-CM

## 2017-12-21 DIAGNOSIS — I10 Essential (primary) hypertension: Secondary | ICD-10-CM

## 2017-12-21 DIAGNOSIS — I69359 Hemiplegia and hemiparesis following cerebral infarction affecting unspecified side: Secondary | ICD-10-CM

## 2017-12-21 DIAGNOSIS — R600 Localized edema: Secondary | ICD-10-CM

## 2017-12-21 DIAGNOSIS — D849 Immunodeficiency, unspecified: Secondary | ICD-10-CM | POA: Insufficient documentation

## 2017-12-21 DIAGNOSIS — E038 Other specified hypothyroidism: Secondary | ICD-10-CM

## 2017-12-21 DIAGNOSIS — M5442 Lumbago with sciatica, left side: Secondary | ICD-10-CM

## 2017-12-21 MED ORDER — HYDROCHLOROTHIAZIDE 25 MG PO TABS: 25.0000 mg | ORAL_TABLET | Freq: Every day | ORAL | 0 refills | Status: DC

## 2017-12-21 MED ORDER — BENAZEPRIL HCL 40 MG PO TABS: 40.0000 mg | ORAL_TABLET | Freq: Every day | ORAL | 0 refills | Status: DC

## 2017-12-21 MED ORDER — GABAPENTIN 300 MG PO CAPS: 300.0000 mg | ORAL_CAPSULE | Freq: Every day | ORAL | 1 refills | Status: DC

## 2017-12-21 MED ORDER — LEVOTHYROXINE SODIUM 50 MCG PO TABS: 50.0000 ug | ORAL_TABLET | Freq: Every day | ORAL | 0 refills | Status: DC

## 2017-12-21 MED ORDER — SERTRALINE HCL 50 MG PO TABS
50.0000 mg | ORAL_TABLET | Freq: Every day | ORAL | 3 refills | Status: DC
Start: 2017-12-21 — End: 2018-01-17

## 2017-12-21 MED ORDER — ATORVASTATIN CALCIUM 40 MG PO TABS: 40.0000 mg | ORAL_TABLET | Freq: Every day | ORAL | 0 refills | Status: DC

## 2017-12-21 MED ORDER — SERTRALINE HCL 100 MG PO TABS: 100.0000 mg | ORAL_TABLET | Freq: Every day | ORAL | 0 refills | Status: DC

## 2017-12-21 NOTE — Patient Instructions (Addendum)
Please call Dr. Verlin Grills office to set up an appointment We have brought down your zoloft dose form 100mg  to 50mg  per your request; if you start to have any depressive symptoms: fatigue, sadness, crying, eating changes, sleeping problems ect please increase to you 100mg  dose and call us immediately. Do not stop taking this medication all of a sudden as it can be very harmful to your health.     Preventing High Cholesterol Cholesterol is a waxy, fat-like substance that your body needs in small amounts. Your liver makes all the cholesterol that your body needs. Having high cholesterol (hypercholesterolemia) increases your risk for heart disease and stroke. Extra (excess) cholesterol comes from the food you eat, such as animal-based fat (saturated fat) from meat and some dairy products. High cholesterol can often be prevented with diet and lifestyle changes. If you already have high cholesterol, you can control it with diet and lifestyle changes, as well as medicine. What nutrition changes can be made?  Eat less saturated fat. Foods that contain saturated fat include red meat and some dairy products.  Avoid processed meats, like bacon and lunch meats.  Avoid trans fats, which are found in margarine and some baked goods.  Avoid foods and beverages that have added sugars.  Eat more fruits, vegetables, and whole grains.  Choose healthy sources of protein, such as fish, poultry, and nuts.  Choose healthy sources of fat, such as: ? Nuts. ? Vegetable oils, especially olive oil. ? Fish that have healthy fats (omega-3 fatty acids), such as mackerel or salmon. What lifestyle changes can be made?  Lose weight if you are overweight. Losing 5-10 lb (2.3-4.5 kg) can help prevent or control high cholesterol and reduce your risk for diabetes and high blood pressure. Ask your health care provider to help you with a diet and exercise plan to safely lose weight.  Get enough exercise. Do at least 150 minutes  of moderate-intensity exercise each week. ? You could do this in short exercise sessions several times a day, or you could do longer exercise sessions a few times a week. For example, you could take a brisk 10-minute walk or bike ride, 3 times a day, for 5 days a week.  Do not smoke. If you need help quitting, ask your health care provider.  Limit your alcohol intake. If you drink alcohol, limit alcohol intake to no more than 1 drink a day for nonpregnant women and 2 drinks a day for men. One drink equals 12 oz of beer, 5 oz of wine, or 1 oz of hard liquor. Why are these changes important? If you have high cholesterol, deposits (plaques) may build up on the walls of your blood vessels. Plaques make the arteries narrower and stiffer, which can restrict or block blood flow and cause blood clots to form. This greatly increases your risk for heart attack and stroke. Making diet and lifestyle changes can reduce your risk for these life-threatening conditions. What can I do to lower my risk?  Manage your risk factors for high cholesterol. Talk with your health care provider about all of your risk factors and how to lower your risk.  Manage other conditions that you have, such as diabetes or high blood pressure (hypertension).  Have your cholesterol checked at regular intervals.  Keep all follow-up visits as told by your health care provider. This is important. How is this treated? In addition to diet and lifestyle changes, your health care provider may recommend medicines to help lower cholesterol,  such as a medicine to reduce the amount of cholesterol made in your liver. You may need medicine if:  Diet and lifestyle changes do not lower your cholesterol enough.  You have high cholesterol and other risk factors for heart disease or stroke.  Take over-the-counter and prescription medicines only as told by your health care provider. Where to find more information:  American Heart Association:  ThisTune.com.pt.jsp  National Heart, Lung, and Blood Institute: FrenchToiletries.com.cy Summary  High cholesterol increases your risk for heart disease and stroke. By keeping your cholesterol level low, you can reduce your risk for these conditions.  Diet and lifestyle changes are the most important steps in preventing high cholesterol.  Work with your health care provider to manage your risk factors, and have your blood tested regularly. This information is not intended to replace advice given to you by your health care provider. Make sure you discuss any questions you have with your health care provider. Document Released: 09/21/2015 Document Revised: 05/15/2016 Document Reviewed: 05/15/2016 Elsevier Interactive Patient Education  2018 [place] in the Home Falls can cause injuries. They can happen to people of all ages. There are many things you can do to make your home safe and to help prevent falls. What can I do on the outside of my home?  Regularly fix the edges of walkways and driveways and fix any cracks.  Remove anything that might make you trip as you walk through a door, such as a raised step or threshold.  Trim any bushes or trees on the path to your home.  Use bright outdoor lighting.  Clear any walking paths of anything that might make someone trip, such as rocks or tools.  Regularly check to see if handrails are loose or broken. Make sure that both sides of any steps have handrails.  Any raised decks and porches should have guardrails on the edges.  Have any leaves, snow, or ice cleared regularly.  Use sand or salt on walking paths during winter.  Clean up any spills in your garage right away. This includes oil or grease spills. What can I do in the bathroom?  Use night lights.  Install grab bars by the toilet and in the tub  and shower. Do not use towel bars as grab bars.  Use non-skid mats or decals in the tub or shower.  If you need to sit down in the shower, use a plastic, non-slip stool.  Keep the floor dry. Clean up any water that spills on the floor as soon as it happens.  Remove soap buildup in the tub or shower regularly.  Attach bath mats securely with double-sided non-slip rug tape.  Do not have throw rugs and other things on the floor that can make you trip. What can I do in the bedroom?  Use night lights.  Make sure that you have a light by your bed that is easy to reach.  Do not use any sheets or blankets that are too big for your bed. They should not hang down onto the floor.  Have a firm chair that has side arms. You can use this for support while you get dressed.  Do not have throw rugs and other things on the floor that can make you trip. What can I do in the kitchen?  Clean up any spills right away.  Avoid walking on wet floors.  Keep items that you use a lot in easy-to-reach places.  If you need to reach  something above you, use a strong step stool that has a grab bar.  Keep electrical cords out of the way.  Do not use floor polish or wax that makes floors slippery. If you must use wax, use non-skid floor wax.  Do not have throw rugs and other things on the floor that can make you trip. What can I do with my stairs?  Do not leave any items on the stairs.  Make sure that there are handrails on both sides of the stairs and use them. Fix handrails that are broken or loose. Make sure that handrails are as long as the stairways.  Check any carpeting to make sure that it is firmly attached to the stairs. Fix any carpet that is loose or worn.  Avoid having throw rugs at the top or bottom of the stairs. If you do have throw rugs, attach them to the floor with carpet tape.  Make sure that you have a light switch at the top of the stairs and the bottom of the stairs. If you do  not have them, ask someone to add them for you. What else can I do to help prevent falls?  Wear shoes that: ? Do not have high heels. ? Have rubber bottoms. ? Are comfortable and fit you well. ? Are closed at the toe. Do not wear sandals.  If you use a stepladder: ? Make sure that it is fully opened. Do not climb a closed stepladder. ? Make sure that both sides of the stepladder are locked into place. ? Ask someone to hold it for you, if possible.  Clearly mark and make sure that you can see: ? Any grab bars or handrails. ? First and last steps. ? Where the edge of each step is.  Use tools that help you move around (mobility aids) if they are needed. These include: ? Canes. ? Walkers. ? Scooters. ? Crutches.  Turn on the lights when you go into a dark area. Replace any light bulbs as soon as they burn out.  Set up your furniture so you have a clear path. Avoid moving your furniture around.  If any of your floors are uneven, fix them.  If there are any pets around you, be aware of where they are.  Review your medicines with your doctor. Some medicines can make you feel dizzy. This can increase your chance of falling. Ask your doctor what other things that you can do to help prevent falls. This information is not intended to replace advice given to you by your health care provider. Make sure you discuss any questions you have with your health care provider. Document Released: 07/03/2009 Document Revised: 02/12/2016 Document Reviewed: 10/11/2014 Elsevier Interactive Patient Education  [employer].

## 2017-12-21 NOTE — Progress Notes (Addendum)
Name: Carolyn Campbell   MRN: [id number]    DOB: 1956-07-27   Date:12/21/2017       Progress Note  Subjective  Chief Complaint  Chief Complaint  Patient presents with  . Follow-up    6 month recheck    HPI  Immunosuppression secondary to Chron's disease and medication use: Follows up with Dr. Marius Ditch- last appointment was on 06/21/2017- recommendation for 3 month follow up. Patient was placed on Lialda and taking Lialda BID- got payment assistance and is still taking this medication; she is off the Imuran. Patient states still occasionally has pain with BM and small amounts BRB when wiping; but states has been okay.   HTN Stable; well-controlled today - Taking benazepril 40mg ; HCTZ 25mg , no chest pain or palpitation.  Hypothyroidism Synthroid has been on stable dose for awhile, denies weight loss, bowel changes, skin changes Lab Results  Component Value Date   TSH 1.74 06/22/2017    Chronic back pain with radiculitis States not daily but occasionally has spells. Takes gabapentin every night.   S/p CVA with left side weakness States left side weakness is same but notice she is forgetting to pick up her left leg as much and has tripped some in the last 4-5 months; states has fallen 2 weeks ago and scraped her knee. States she doesn't want people to know she has a problem with walking so she walks fast so her legs glide, instead of walking slowly and lifting up feet. On statin & BP meds; lipids and BP well controlled   Atherosclerosis of aorta:  Taking lipitor every night, no SE.   Hyperlipidemia Stable-taking statin and denies myalgia, chest pain or SOB, we will recheck labs. Patient ate ham sandwich  Lab Results  Component Value Date   CHOL 200 (H) 06/22/2017   HDL 88 06/22/2017   [place] 95 06/22/2017   TRIG 79 06/22/2017   CHOLHDL 2.3 06/22/2017    Fibrocystic Breasts: Mammogram done after 06/2017 negative: No mammographic or sonographic evidence of malignancy. Lump felt  at PCP visit and [place] completed showing: The left lumpectomy site is stable. No suspicious masses, calcifications, distortion, or changes in either breast. Denies pain, nipple discharge.   Arthritis of right hand  states worse when she uses it a lot; patient states typically just deals with the pain sometimes does hand stretching for relief.   GERD Diet controlled- does not take protonix anymore  Fever blisters States takes valtrex as needed, for fever blisters around mouth, last breakout was a month ago.   Depression/ Anxiety  Takes zoloft every day- states it is well controlled on this dose has probably been on it for 15 years. States has had depression due to husbands health. Patient would like to come off this medication today states she has been fine for a long time. Denies si/hi Depression screen [facility] 2/9 12/21/2017  Decreased Interest 0  Down, Depressed, Hopeless 0  PHQ - 2 Score 0  Altered sleeping 1  Tired, decreased energy 2  Change in appetite 0  Feeling bad or failure about yourself  0  Trouble concentrating 0  Suicidal thoughts 0  PHQ-9 Score 3   GAD 7 : Generalized Anxiety Score 12/21/2017  Nervous, Anxious, on Edge 0  Control/stop worrying 0  Worry too much - different things 1  Trouble relaxing 0  Restless 0  Easily annoyed or irritable 0  Afraid - awful might happen 1  Total GAD 7 Score 2    Vitamin  D Deficiency Has supplements at home and took it in the past but hasn't taken it in the past few months. Endorses chronic fatigue- states has improved with being more active.   Patient Active Problem List   Diagnosis Date Noted  . Fibrocystic breast changes 06/22/2017  . Crohn's disease of colon with rectal bleeding ([place])   . Dysphagia as late effect of stroke 03/20/2017  . Bilateral lower extremity edema 03/18/2017  . Osteopenia 10/18/2016  . Atherosclerosis of aorta ([place]) 12/19/2015  . Vitreous degeneration 05/21/2015  . Bad memory 05/21/2015  . Chronic  ulcerative proctitis ([place]) 05/21/2015  . Glaucoma associated with chamber angle anomalies 05/21/2015  . Dermatitis, eczematoid 05/21/2015  . CD (Crohn's disease) ([place]) 05/21/2015  . H/O malignant neoplasm of breast 05/21/2015  . Crohn's disease of both small and large intestine with rectal bleeding ([place]) 12/04/2014  . Solitary pulmonary nodule 11/07/2012  . 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 Cognitive deficits as late effect of cerebrovascular disease 01/20/2010  . CVA, old, hemiparesis ([place]) 04/28/2009  . Adult hypothyroidism 06/12/2008  . Acid reflux 05/23/2007  . Hypercholesterolemia without hypertriglyceridemia 01/18/2007  . Benign essential HTN 01/18/2007    Social History   Tobacco Use  . Smoking status: Never Smoker  . Smokeless tobacco: Never Used  Substance Use Topics  . Alcohol use: No    Alcohol/week: 0.0 oz     Current Outpatient Medications:  .  atorvastatin (LIPITOR) 40 MG tablet, TAKE 1 TABLET BY MOUTH  DAILY, Disp: 90 tablet, Rfl: 0 .  azaTHIOprine (IMURAN) 50 MG tablet, TAKE 1 TABLET BY MOUTH  DAILY, Disp: 90 tablet, Rfl: 3 .  benazepril (LOTENSIN) 40 MG tablet, TAKE 1 TABLET BY MOUTH  DAILY, Disp: 90 tablet, Rfl: 0 .  diclofenac sodium (VOLTAREN) 1 % GEL, Apply 2 g topically 4 (four) times daily., Disp: 100 g, Rfl: 0 .  gabapentin (NEURONTIN) 300 MG capsule, TAKE 1 CAPSULE BY MOUTH AT  BEDTIME, Disp: 90 capsule, Rfl: 1 .  hydrochlorothiazide (HYDRODIURIL) 25 MG tablet, TAKE 1 TABLET BY MOUTH  DAILY, Disp: 90 tablet, Rfl: 0 .  latanoprost (XALATAN) 0.005 % ophthalmic solution, Place 1 drop into both eyes at bedtime. , Disp: , Rfl:  .  levothyroxine (SYNTHROID, LEVOTHROID) 50 MCG tablet, TAKE 1 TABLET BY MOUTH  DAILY, Disp: 90 tablet, Rfl: 0 .  Multiple Vitamin (MULTIVITAMIN) tablet, Take 1 tablet by mouth daily., Disp: , Rfl:  .  pantoprazole (PROTONIX) 40 MG tablet, Take 40 mg by mouth daily., Disp: , Rfl:  .  sertraline (ZOLOFT)  100 MG tablet, TAKE 1 TABLET BY MOUTH  DAILY, Disp: 90 tablet, Rfl: 0 .  valACYclovir (VALTREX) 1000 MG tablet, Take 1 tablet (1,000 mg total) by mouth 2 (two) times daily., Disp: 30 tablet, Rfl: 0 .  mesalamine (LIALDA) 1.2 g EC tablet, Take 1 tablet (1.2 g total) by mouth 2 (two) times daily., Disp: 180 tablet, Rfl: 1  No Known Allergies  ROS  Constitutional: Negative for fever or weight change.  Respiratory: Positive for chronic dry cough and Negative shortness of breath.   Cardiovascular: Negative for chest pain or palpitations.  Gastrointestinal: Positive for abdominal pain at times with BM, no bowel changes.  Musculoskeletal: Positive for gait problem-  Negative joint swelling.  Skin: Negative for rash.  Neurological: Negative for dizziness or headache.  No other specific complaints in a complete review of systems (except as listed in HPI above).  Objective  Vitals:  12/21/17 1025  BP: 120/60  Pulse: 94  Resp: 16  Temp: 99.3 F (37.4 C)  TempSrc: Oral  SpO2: 97%  Weight: 134 lb 6.4 oz (61 kg)  Height: 5' (1.524 m)    Body mass index is 26.25 kg/m.  Nursing Note and Vital Signs reviewed.  Physical Exam   Constitutional: Patient appears well-developed and well-nourished.  No distress.  HEENT: head atraumatic, normocephalic, pupils equal and reactive to light, neck supple without lymphadenopathy, no thyromegaly, no carotid bruits Cardiovascular: Normal rate, regular rhythm, S1/S2 present.  No murmur or rub heard. Pulses intact, skin warm and dry  Pulmonary/Chest: Effort normal and breath sounds clear. No respiratory distress or retractions. Abdominal: Soft and non-tender, bowel sounds present  MSK: steady gait, no spinal tenderness Psychiatric: Patient has a normal mood and affect. behavior is normal. Judgment and thought content normal.  No results found for this or any previous visit (from the past 72 hour(s)).  Assessment & Plan  1. Benign essential HTN -  Well controlled  - Lipid Profile - COMPLETE METABOLIC PANEL WITH GFR - hydrochlorothiazide (HYDRODIURIL) 25 MG tablet; Take 1 tablet (25 mg total) by mouth daily.  Dispense: 90 tablet; Refill: 0 -benzapril 40 mg   2. Atherosclerosis of aorta (HCC) - diet, exercise  - Lipid Profile - atorvastatin (LIPITOR) 40 MG tablet; Take 1 tablet (40 mg total) by mouth daily.  Dispense: 90 tablet; Refill: 0  3. Arthritis of right hand - Discussed tylenol as neede   4. Crohn's disease of both small and large intestine with rectal bleeding ([place]) - Follow up with Dr. Marius Ditch   5. Adult hypothyroidism Stable - TSH -- levothyroxine (SYNTHROID, LEVOTHROID) 50 MCG tablet; Take 1 tablet (50 mcg total) by mouth daily.  Dispense: 90 tablet; Refill: 0 6. CVA, old, hemiparesis ([place]) - discussed PT for walking; patient declines states I know what I need to do I just need to do it.  - Lipid Profile - COMPLETE METABOLIC PANEL WITH GFR  7. Hypercholesterolemia without hypertriglyceridemia - diet, exercise, stable - Lipid Profile ( she did eat ham sandwich this morning)  - atorvastatin (LIPITOR) 40 MG tablet; Take 1 tablet (40 mg total) by mouth daily.  Dispense: 90 tablet; Refill: 0  8. Fibrocystic breast changes, unspecified laterality -stable  9. Gastroesophageal reflux disease, esophagitis presence not specified - diet controlled   10. Immunosuppressed status ([place]) - follow up with Dr. Marius Ditch, continue lialda - CBC  12. Medication management  - COMPLETE METABOLIC PANEL WITH GFR  13. Vitamin D deficiency - will take OTC if low - Vitamin D (25 hydroxy)  14. Chronic bilateral low back pain with left-sided sciatica - gabapentin (NEURONTIN) 300 MG capsule; Take 1 capsule (300 mg total) by mouth at bedtime.  Dispense: 90 capsule; Refill: 1  15. Sciatic leg pain - gabapentin (NEURONTIN) 300 MG capsule; Take 1 capsule (300 mg total) by mouth at bedtime.  Dispense: 90 capsule; Refill: 1  16.  Bilateral lower extremity edema - hydrochlorothiazide (HYDRODIURIL) 25 MG tablet; Take 1 tablet (25 mg total) by mouth daily.  Dispense: 90 tablet; Refill: 0  17. Major depression in remission ([place]) - stable, shared decision making to come off zoloft- discussed weaning patient insistent on trying to stop medication she has been on for 15 years without side effects. Return precautions disucssed - sertraline (ZOLOFT) 50 MG tablet; Take 1 tablet (50 mg total) by mouth daily.  Dispense: 30 tablet; Refill: 3  -Red flags and when to  present for emergency care or RTC including fever >101.73F, chest pain, shortness of breath, new/worsening/un-resolving symptoms, SI/HI reviewed with patient at time of visit. Follow up and care instructions discussed and provided in AVS.  I have reviewed this encounter including the documentation in this note and/or discussed this patient with the provider, Suezanne Cheshire DNP AGNP-C. I am certifying that I agree with the content of this note as supervising physician. Steele Sizer, MD [facility] 12/21/2017, 4:56 PM

## 2017-12-28 DIAGNOSIS — H43813 Vitreous degeneration, bilateral: Secondary | ICD-10-CM | POA: Diagnosis not present

## 2017-12-28 DIAGNOSIS — H47233 Glaucomatous optic atrophy, bilateral: Secondary | ICD-10-CM | POA: Diagnosis not present

## 2017-12-28 DIAGNOSIS — I10 Essential (primary) hypertension: Secondary | ICD-10-CM | POA: Diagnosis not present

## 2017-12-28 DIAGNOSIS — I69359 Hemiplegia and hemiparesis following cerebral infarction affecting unspecified side: Secondary | ICD-10-CM | POA: Diagnosis not present

## 2017-12-28 DIAGNOSIS — E559 Vitamin D deficiency, unspecified: Secondary | ICD-10-CM | POA: Diagnosis not present

## 2017-12-28 DIAGNOSIS — E7800 Pure hypercholesterolemia, unspecified: Secondary | ICD-10-CM | POA: Diagnosis not present

## 2017-12-28 DIAGNOSIS — H401121 Primary open-angle glaucoma, left eye, mild stage: Secondary | ICD-10-CM | POA: Diagnosis not present

## 2017-12-28 DIAGNOSIS — H401112 Primary open-angle glaucoma, right eye, moderate stage: Secondary | ICD-10-CM | POA: Diagnosis not present

## 2017-12-28 DIAGNOSIS — E039 Hypothyroidism, unspecified: Secondary | ICD-10-CM | POA: Diagnosis not present

## 2017-12-28 NOTE — Telephone Encounter (Signed)
Patient had lab work done this morning and was notified medication was sent to [facility].

## 2017-12-29 LAB — CBC
HCT: 33.8 % — ABNORMAL LOW (ref 35.0–45.0)
Hemoglobin: 11.6 g/dL — ABNORMAL LOW (ref 11.7–15.5)
MCH: 29.4 pg (ref 27.0–33.0)
MCHC: 34.3 g/dL (ref 32.0–36.0)
MCV: 85.6 fL (ref 80.0–100.0)
MPV: 10.7 fL (ref 7.5–12.5)
Platelets: 201 10*3/uL (ref 140–400)
RBC: 3.95 10*6/uL (ref 3.80–5.10)
RDW: 12.6 % (ref 11.0–15.0)
WBC: 4.3 10*3/uL (ref 3.8–10.8)

## 2017-12-29 LAB — LIPID PANEL
Cholesterol: 203 mg/dL — ABNORMAL HIGH (ref <200)
HDL: 100 mg/dL (ref >50)
LDL Cholesterol (Calc): 89 mg/dL (calc)
Non-HDL Cholesterol (Calc): 103 mg/dL (calc) (ref <130)
Total CHOL/HDL Ratio: 2 (calc) (ref <5.0)
Triglycerides: 57 mg/dL (ref <150)

## 2017-12-29 LAB — COMPLETE METABOLIC PANEL WITH GFR
AG Ratio: 1.8 (calc) (ref 1.0–2.5)
ALT: 15 U/L (ref 6–29)
AST: 17 U/L (ref 10–35)
Albumin: 4.5 g/dL (ref 3.6–5.1)
Alkaline phosphatase (APISO): 81 U/L (ref 33–130)
BUN: 13 mg/dL (ref 7–25)
CO2: 33 mmol/L — ABNORMAL HIGH (ref 20–32)
Calcium: 9.6 mg/dL (ref 8.6–10.4)
Chloride: 101 mmol/L (ref 98–110)
Creat: 0.82 mg/dL (ref 0.50–0.99)
GFR, Est African American: 88 mL/min/{1.73_m2} (ref >=60)
GFR, Est Non African American: 76 mL/min/{1.73_m2} (ref >=60)
Globulin: 2.5 g/dL (calc) (ref 1.9–3.7)
Glucose, Bld: 100 mg/dL — ABNORMAL HIGH (ref 65–99)
Potassium: 3.7 mmol/L (ref 3.5–5.3)
Sodium: 141 mmol/L (ref 135–146)
Total Bilirubin: 0.5 mg/dL (ref 0.2–1.2)
Total Protein: 7 g/dL (ref 6.1–8.1)

## 2017-12-29 LAB — TSH: TSH: 2.08 mIU/L (ref 0.40–4.50)

## 2017-12-29 LAB — VITAMIN D 25 HYDROXY (VIT D DEFICIENCY, FRACTURES): Vit D, 25-Hydroxy: 33 ng/mL (ref 30–100)

## 2018-01-17 ENCOUNTER — Telehealth: Payer: Self-pay

## 2018-01-17 ENCOUNTER — Other Ambulatory Visit
Admission: RE | Admit: 2018-01-17 | Discharge: 2018-01-17 | Disposition: A | Discharge status: Home / Self Care | Payer: Medicare Other | Source: Ambulatory Visit | Attending: Gastroenterology | Admitting: Gastroenterology

## 2018-01-17 ENCOUNTER — Encounter: Payer: Self-pay | Admitting: Gastroenterology

## 2018-01-17 ENCOUNTER — Other Ambulatory Visit: Payer: Self-pay

## 2018-01-17 ENCOUNTER — Ambulatory Visit (INDEPENDENT_AMBULATORY_CARE_PROVIDER_SITE_OTHER): Payer: Medicare Other | Admitting: Gastroenterology

## 2018-01-17 VITALS — BP 131/67 | Wt 133.6 lb

## 2018-01-17 DIAGNOSIS — K5080 Crohn's disease of both small and large intestine without complications: Secondary | ICD-10-CM

## 2018-01-17 DIAGNOSIS — K5000 Crohn's disease of small intestine without complications: Secondary | ICD-10-CM | POA: Insufficient documentation

## 2018-01-17 DIAGNOSIS — F325 Major depressive disorder, single episode, in full remission: Secondary | ICD-10-CM

## 2018-01-17 LAB — CBC
HCT: 35.5 % (ref 35.0–47.0)
Hemoglobin: 11.9 g/dL — ABNORMAL LOW (ref 12.0–16.0)
MCH: 29.7 pg (ref 26.0–34.0)
MCHC: 33.7 g/dL (ref 32.0–36.0)
MCV: 88.3 fL (ref 80.0–100.0)
Platelets: 214 10*3/uL (ref 150–440)
RBC: 4.01 MIL/uL (ref 3.80–5.20)
RDW: 14.7 % — ABNORMAL HIGH (ref 11.5–14.5)
WBC: 5.6 10*3/uL (ref 3.6–11.0)

## 2018-01-17 LAB — IRON AND TIBC
Iron: 53 ug/dL (ref 28–170)
Saturation Ratios: 14 % (ref 10.4–31.8)
TIBC: 378 ug/dL (ref 250–450)
UIBC: 325 ug/dL

## 2018-01-17 LAB — C-REACTIVE PROTEIN: CRP: 1.1 mg/dL — ABNORMAL HIGH (ref <1.0)

## 2018-01-17 LAB — VITAMIN B12: Vitamin B-12: 664 pg/mL (ref 180–914)

## 2018-01-17 LAB — FERRITIN: Ferritin: 56 ng/mL (ref 11–307)

## 2018-01-17 MED ORDER — SERTRALINE HCL 50 MG PO TABS: 50.0000 mg | ORAL_TABLET | Freq: Every day | ORAL | 0 refills | Status: DC

## 2018-01-17 MED ORDER — MESALAMINE 1.2 G PO TBEC: 1.2000 g | DELAYED_RELEASE_TABLET | Freq: Four times a day (QID) | ORAL | 1 refills | Status: DC

## 2018-01-17 NOTE — Progress Notes (Signed)
[name], MD [address]  [place]  [place], [place] [address]  Main: [phone]  Fax: [phone]    Gastroenterology Consultation  Referring Provider:     Hubbard Hartshorn, FNP Primary Care Physician:  Hubbard Hartshorn, FNP Primary Gastroenterologist:  Dr. [name] Reason for Consultation: Crohn's Disease        HPI:   Carolyn Campbell is a 62 y.o. y/o female referred by Dr. Uvaldo Rising, Astrid Divine, FNP  for consultation & management of Crohn's disease. This was diagnosed in 2015, currently on Imuran 50 mg daily. She reports intermittent flareups of her Crohn's disease such as abdominal pain, bloating, rectal bleeding. She received prednisone for flare up. She had 2 flareups within last 1 year. She recently is suffering with constipation, had to take laxatives to have a BM. Her last BM was 3 days ago. She does report some upper abdominal bloating. She denies any weight loss, rectal bleeding, diarrhea, nausea, vomiting. She is not on any prednisone currently. She denies taking NSAIDs.  Follow up 06/21/2017: She underwent EGD and colonoscopy. EGD was normal, colonoscopy revealed normal mucosa but the biopsies showed mild active chronic ileitis and right-sided colitis. No granulomas were seen Doing well overall. Having BM every other day, denies feeling constipated. She does have mild abdominal discomfort after a BM which lasts for about a minute. She continues to take Imuran 50 mg daily. She is trying to lose weight  Follow-up visit 01/17/2018 Patient had an episode of flare up in 09/2017 when she developed right lower quadrant discomfort associated with constipation, pain persisted after relief of constipation which lasted for about 2 weeks. She did not call my office and on the time. She did not take any prednisone, no ER visits. She is currently on Lialda 1.2 g twice daily. She denies having ongoing symptoms. She reports having regular bowel movements and her weight has been  stable. Her most recent blood work revealed mild normocytic anemia.  Crohn's disease classification:  Age: > 40 Location: ileocolonic  Behavior: non stricturing, non penetrating  Perianal: no  IBD diagnosis: 11/2013  Disease course:Crohn's disease in 2015, initial symptoms were abdominal pain, blood in stool and on wiping, bloating. She had a colonoscopy, VCE, EGD at the time of diagnosis. She was initially on mesalamine 4 pills daily, prednisone later switched to Remicade in 11/2014. She took only 5 doses as she could not afford. Then she was switched to Imuran 50 mg daily. She had mild flareups about twice a year. Colonoscopy 05/2017 revealed mild ileocolonic disease primarily on histology. She self discontinued Imuran. Lialda started in 06/2017  Extra intestinal manifestations: None  IBD surgical history: None No family history of IBD Imaging:  MRE none CTE none SBFT none  Procedures:  Colonoscopy : 01/28/2005, showed external hemorrhoids only Colonoscopy 12/10/2013, showed normal terminal ileum, biopsies performed. Skipped areas of nonbleeding ulcerative mucosa were seen in the entire colon, biopsies performed. 4 mm polyp in the sigmoid colon Pathology: Rectal biopsy mild-to-moderate active proctitis with architectural features of chronicity, negative for dysplasia and malignancy. Terminal ileum: Small bowel mucosa with preserved villous architecture. Right colon biopsy: No significant pathologic changes  Upper Endoscopy 12/10/2013, underwent dilation of the esophagus Ampullary biopsy: Normal, gastric biopsy: Intramucosal with erosion and mild chronic gastritis, negative for H. pylori, dysplasia and malignancy  VCE 05/06/2014: Erosions in the distal ileum  EGD 06/08/2017 Normal esophagus, small hiatal hernia, normal stomach and duodenum  Colonoscopy 06/08/2017 The perianal  and digital rectal examinations were normal. Pertinent negatives include normal sphincter tone and no  palpable rectal Lesions. The terminal ileum appeared normal. Biopsies were taken with a cold forceps for histology. Two scattered non-bleeding aphthae were found in the ascending colon. Rest of the colon and rectum appeared normal. Random biopsies performed   IBD medications:  Steroids: Prednisone, budesonide 5-ASA: mesalamine Immunomodulators: AZA currently on 50 mg daily, methotrexate TPMT status unknown Biologics:  Anti TNFs: Remicade monotherapy, received induction followed by 1 maintenance dose in 11/2014 Anti Integrins: Ustekinumab: Tofactinib: Clinical trial:   Past Medical History:  Diagnosis Date  . Allergic rhinitis   . Anginal pain ([place])    states MD said it was GERD  . Arthritis   . Asthma   . Breast cancer ([place]) 2006   LT LUMPECTOMY  . Cognitive deficits as late effect of cerebrovascular disease   . COPD (chronic obstructive pulmonary disease) ([place])   . Crohn's disease of both small and large intestine with rectal bleeding ([place]) 12/04/2014  . 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 Dyspnea   . Esophageal reflux   . Glaucoma    vitreous degeneration  . History of kidney stones   . Hyperlipidemia   . Hypertension   . Hypothyroidism   . Occlusion, cerebral artery    NOS w/infarction  . Osteoporosis   . Personal history of radiation therapy 2006   BREAST CA  . Stroke [facility])     Past Surgical History:  Procedure Laterality Date  . BREAST BIOPSY Left 2006   POS  . BREAST LUMPECTOMY Left 09/20/2004   positive  . BREAST SURGERY Left    malignant biopsy  . CARDIAC CATHETERIZATION    . COLONOSCOPY  2015  . COLONOSCOPY WITH PROPOFOL N/A 06/08/2017   Procedure: COLONOSCOPY WITH PROPOFOL;  Surgeon: Lin Landsman, MD;  Location: [facility];  Service: Gastroenterology;  Laterality: N/A;  . ESOPHAGOGASTRODUODENOSCOPY  2015  . ESOPHAGOGASTRODUODENOSCOPY (EGD) WITH PROPOFOL N/A 06/08/2017   Procedure: ESOPHAGOGASTRODUODENOSCOPY (EGD)  WITH PROPOFOL;  Surgeon: Lin Landsman, MD;  Location: [facility];  Service: Gastroenterology;  Laterality: N/A;  . FINGER SURGERY     Right small finger  . [place] STUDY  2015  . TUBAL LIGATION       Current Outpatient Medications:  .  alendronate (FOSAMAX) 70 MG tablet, TAKE 1 TABLET BY MOUTH ONCE A WEEK. TAKE WITH A FULL  GLASS OF WATER ON AN EMPTY  STOMACH., Disp: 12 tablet, Rfl: 0 .  atorvastatin (LIPITOR) 40 MG tablet, Take 1 tablet (40 mg total) by mouth daily., Disp: 90 tablet, Rfl: 0 .  benazepril (LOTENSIN) 40 MG tablet, Take 1 tablet (40 mg total) by mouth daily., Disp: 90 tablet, Rfl: 0 .  diclofenac sodium (VOLTAREN) 1 % GEL, Apply 2 g topically 4 (four) times daily., Disp: 100 g, Rfl: 0 .  gabapentin (NEURONTIN) 300 MG capsule, Take 1 capsule (300 mg total) by mouth at bedtime., Disp: 90 capsule, Rfl: 1 .  hydrochlorothiazide (HYDRODIURIL) 25 MG tablet, Take 1 tablet (25 mg total) by mouth daily., Disp: 90 tablet, Rfl: 0 .  latanoprost (XALATAN) 0.005 % ophthalmic solution, Place 1 drop into both eyes at bedtime. , Disp: , Rfl:  .  levothyroxine (SYNTHROID, LEVOTHROID) 50 MCG tablet, Take 1 tablet (50 mcg total) by mouth daily., Disp: 90 tablet, Rfl: 0 .  Multiple Vitamin (MULTIVITAMIN) tablet, Take 1 tablet by mouth daily., Disp: , Rfl:  .  sertraline (ZOLOFT) 50 MG tablet, Take 1 tablet (50 mg total) by mouth daily., Disp: 30 tablet, Rfl: 3 .  valACYclovir (VALTREX) 1000 MG tablet, Take 1 tablet (1,000 mg total) by mouth 2 (two) times daily., Disp: 30 tablet, Rfl: 0 .  mesalamine (LIALDA) 1.2 g EC tablet, Take 1 tablet (1.2 g total) by mouth QID., Disp: 180 tablet, Rfl: 1   Family History  Problem Relation Age of Onset  . Heart disease Mother   . Heart attack Mother   . Breast cancer Sister   . Alcohol abuse Father   . Cerebrovascular Accident Father   . Hypertension Unknown   . Diabetes Unknown   . Breast cancer Sister 8  . Heart attack Other  31     Social History   Tobacco Use  . Smoking status: Never Smoker  . Smokeless tobacco: Never Used  Substance Use Topics  . Alcohol use: No    Alcohol/week: 0.0 oz  . Drug use: No    Allergies as of 01/17/2018 - Review Complete 01/17/2018  Allergen Reaction Noted  . Other Palpitations 11/07/2012    Review of Systems:    All systems reviewed and negative except where noted in HPI.   Physical Exam:  BP 131/67   Wt 133 lb 9.6 oz (60.6 kg)   BMI 26.09 kg/m  No LMP recorded. Patient is postmenopausal.  General:   Alert,  Well-developed, well-nourished, pleasant and cooperative in NAD Head:  Normocephalic and atraumatic. Eyes:  Sclera clear, no icterus.   Conjunctiva pink. Ears:  Normal auditory acuity. Nose:  No deformity, discharge, or lesions. Mouth:  No deformity or lesions,oropharynx pink & moist. Neck:  Supple; no masses or thyromegaly. Lungs:  Respirations even and unlabored.  Clear throughout to auscultation.   No wheezes, crackles, or rhonchi. No acute distress. Heart:  Regular rate and rhythm; no murmurs, clicks, rubs, or gallops. Abdomen:  Normal bowel sounds.  No bruits.  Soft, mid RLQ tenderness and non-distended without masses, hepatosplenomegaly or hernias noted.  No guarding or rebound tenderness.   Rectal: Nor performed Msk:  Symmetrical without gross deformities. Good, equal movement & strength bilaterally. Pulses:  Normal pulses noted. Extremities:  No clubbing or edema.  No cyanosis. Neurologic:  Alert and oriented x3;  grossly normal neurologically. Skin:  Intact without significant lesions or rashes. No jaundice. Psych:  Alert and cooperative. Normal mood and affect.  Imaging Studies: MRI reviewed  Assessment and Plan:   Carolyn Campbell is a 62 y.o. African-American female with Terminal ileal and colonic Crohn's, diagnosed in 2015 who was previously maintained on Imuran 50 mg daily with mild intermittent flareups about twice a year needing  prednisone. She does not have any endoscopy evidence of ileocolonic Crohn's disease other than a few scattered aphthae in the ascending colon on colonoscopy in 05/2017 but histology revealed chronic mild active ileitis and right-sided colitis. CRP is significantly elevated at 13.29. MR enterography did not inflammation in small bowel. I tried to give her budesonide but she could not afford high co-pay of $500 for 30 days supply. Currently, she is on on Lialda 2.4 g daily as she is in clinical remission. She stopped Imuran by herself.  Recommend to increase Lialda to 4.8 g daily given recent flareup Recheck labs today, if her CRP is significantly elevated, recommend colonoscopy to assess disease severity and determine the need for stepup therapy  IBD Health Maintenance  1.TB status: Gold quantiferon negative on 12/06/2014 2. Anemia: Absent 3.Immunizations:  Hep A and B not immune,received 1 dose in 06/2017, Influenza vaccine received in 06/2017, prevnar  Received in 06/2017, pneumovax not received, recommend 4.Cancer screening I) Colon cancer/dysplasia surveillance: Colonoscopy 05/2017, no evidence of dysplasia and no polyps identified. Repeat colonoscopy in 5 years II) Cervical cancer: n/a III) Skin cancer - n/a  5.Bone health Vitamin D status: Normal Bone density testing: Not done 5. Labs: Recheck CRP, CBC, ferritin 6. Smoking: Never smoked 7. NSAIDs and Antibiotics use: Denies NSAID use and antibiotic use  Follow up in 4 weeks   [name], MD

## 2018-01-17 NOTE — Telephone Encounter (Signed)
Copied from [place] [id number]. Topic: General - Other >> Jan 17, 2018  2:18 PM Carolyn Stare wrote:  Pt cal land said the below med has to be a 90 supply cause if not she has to pay a co pay. So she is requesting a 90 day supply be sent to [facility]    sertraline (ZOLOFT) 50 MG tablet

## 2018-01-17 NOTE — Telephone Encounter (Signed)
PRESCRIPTION SENT TO PHARMACY

## 2018-01-24 ENCOUNTER — Encounter: Payer: Self-pay | Admitting: Gastroenterology

## 2018-02-16 ENCOUNTER — Encounter: Payer: Self-pay | Admitting: Gastroenterology

## 2018-02-16 ENCOUNTER — Ambulatory Visit (INDEPENDENT_AMBULATORY_CARE_PROVIDER_SITE_OTHER): Payer: Medicare Other | Admitting: Gastroenterology

## 2018-02-16 VITALS — BP 108/64 | HR 72 | Resp 16 | Ht 60.0 in | Wt 134.0 lb

## 2018-02-16 DIAGNOSIS — K5080 Crohn's disease of both small and large intestine without complications: Secondary | ICD-10-CM

## 2018-02-16 NOTE — Progress Notes (Signed)
[name], MD [address]  [place]  [place], [place] [address]  Main: [phone]  Fax: [phone]    Gastroenterology Consultation  Referring Provider:     Hubbard Hartshorn, FNP Primary Care Physician:  Hubbard Hartshorn, FNP Primary Gastroenterologist:  Dr. [name] Reason for Consultation: Crohn's Disease        HPI:   Carolyn Campbell is a 62 y.o. African-American female referred by Dr. Uvaldo Rising, Astrid Divine, FNP  for consultation & management of Crohn's disease. This was diagnosed in 2015, currently on Imuran 50 mg daily. She reports intermittent flareups of her Crohn's disease such as abdominal pain, bloating, rectal bleeding. She received prednisone for flare up. She had 2 flareups within last 1 year. She recently is suffering with constipation, had to take laxatives to have a BM. Her last BM was 3 days ago. She does report some upper abdominal bloating. She denies any weight loss, rectal bleeding, diarrhea, nausea, vomiting. She is not on any prednisone currently. She denies taking NSAIDs.  Follow up 06/21/2017: She underwent EGD and colonoscopy. EGD was normal, colonoscopy revealed normal mucosa but the biopsies showed mild active chronic ileitis and right-sided colitis. No granulomas were seen Doing well overall. Having BM every other day, denies feeling constipated. She does have mild abdominal discomfort after a BM which lasts for about a minute. She continues to take Imuran 50 mg daily. She is trying to lose weight  Follow-up visit 01/17/2018 Patient had an episode of flare up in 09/2017 when she developed right lower quadrant discomfort associated with constipation, pain persisted after relief of constipation which lasted for about 2 weeks. She did not call my office and on the time. She did not take any prednisone, no ER visits. She is currently on Lialda 1.2 g twice daily. She denies having ongoing symptoms. She reports having regular bowel movements and her weight  has been stable. Her most recent blood work revealed mild normocytic anemia.  Follow up visit 02/16/2018 She denies any complaints today. She is currently on Lialda 1.2 g 4 pills daily. She was briefly on 6 pills per day when she had worsened symptoms about 3 weeks ago. Her CRP is improving. Her iron levels are normal, however she is mildly anemic. She is currently taking oral iron daily. She now has insurance from the [place] and requests future prescriptions to be sent to the [place] so that she doesn't need to pay any co-pay.   Crohn's disease classification:  Age: > 40 Location: ileocolonic  Behavior: non stricturing, non penetrating  Perianal: no  IBD diagnosis: 11/2013  Disease course:Crohn's disease in 2015, initial symptoms were abdominal pain, blood in stool and on wiping, bloating. She had a colonoscopy, VCE, EGD at the time of diagnosis. She was initially on mesalamine 4 pills daily, prednisone later switched to Remicade in 11/2014. She took only 5 doses as she could not afford. Then she was switched to Imuran 50 mg daily. She had mild flareups about twice a year. Colonoscopy 05/2017 revealed mild ileocolonic disease primarily on histology. She self discontinued Imuran. Lialda started in 06/2017  Extra intestinal manifestations: None  IBD surgical history: None No family history of IBD Imaging:  MRE none CTE none SBFT none  Procedures:  Colonoscopy : 01/28/2005, showed external hemorrhoids only Colonoscopy 12/10/2013, showed normal terminal ileum, biopsies performed. Skipped areas of nonbleeding ulcerative mucosa were seen in the entire colon, biopsies performed. 4 mm polyp in the sigmoid  colon Pathology: Rectal biopsy mild-to-moderate active proctitis with architectural features of chronicity, negative for dysplasia and malignancy. Terminal ileum: Small bowel mucosa with preserved villous architecture. Right colon biopsy: No significant pathologic changes  Upper Endoscopy  12/10/2013, underwent dilation of the esophagus Ampullary biopsy: Normal, gastric biopsy: Intramucosal with erosion and mild chronic gastritis, negative for H. pylori, dysplasia and malignancy  VCE 05/06/2014: Erosions in the distal ileum  EGD 06/08/2017 Normal esophagus, small hiatal hernia, normal stomach and duodenum  Colonoscopy 06/08/2017 The perianal and digital rectal examinations were normal. Pertinent negatives include normal sphincter tone and no palpable rectal Lesions. The terminal ileum appeared normal. Biopsies were taken with a cold forceps for histology. Two scattered non-bleeding aphthae were found in the ascending colon. Rest of the colon and rectum appeared normal. Random biopsies performed   IBD medications:  Steroids: Prednisone, budesonide 5-ASA: mesalamine, Lialda Immunomodulators: stopped azathioprine  TPMT status unknown Biologics:  Anti TNFs: Remicade monotherapy, received induction followed by 1 maintenance dose in 11/2014 Anti Integrins: Ustekinumab: Tofactinib: Clinical trial:   Past Medical History:  Diagnosis Date  . Allergic rhinitis   . Anginal pain ([place])    states MD said it was GERD  . Arthritis   . Asthma   . Breast cancer ([place]) 2006   LT LUMPECTOMY  . Cognitive deficits as late effect of cerebrovascular disease   . COPD (chronic obstructive pulmonary disease) ([facility])   . Crohn's disease of both small and large intestine with rectal bleeding ([place]) 12/04/2014  . 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 Dyspnea   . Esophageal reflux   . Glaucoma    vitreous degeneration  . History of kidney stones   . Hyperlipidemia   . Hypertension   . Hypothyroidism   . Occlusion, cerebral artery    NOS w/infarction  . Osteoporosis   . Personal history of radiation therapy 2006   BREAST CA  . Stroke [facility])     Past Surgical History:  Procedure Laterality Date  . BREAST BIOPSY Left 2006   POS  . BREAST LUMPECTOMY Left 09/20/2004    positive  . BREAST SURGERY Left    malignant biopsy  . CARDIAC CATHETERIZATION    . COLONOSCOPY  2015  . COLONOSCOPY WITH PROPOFOL N/A 06/08/2017   Procedure: COLONOSCOPY WITH PROPOFOL;  Surgeon: Lin Landsman, MD;  Location: [facility];  Service: Gastroenterology;  Laterality: N/A;  . ESOPHAGOGASTRODUODENOSCOPY  2015  . ESOPHAGOGASTRODUODENOSCOPY (EGD) WITH PROPOFOL N/A 06/08/2017   Procedure: ESOPHAGOGASTRODUODENOSCOPY (EGD) WITH PROPOFOL;  Surgeon: Lin Landsman, MD;  Location: [facility];  Service: Gastroenterology;  Laterality: N/A;  . FINGER SURGERY     Right small finger  . [place] STUDY  2015  . TUBAL LIGATION       Current Outpatient Medications:  .  alendronate (FOSAMAX) 70 MG tablet, TAKE 1 TABLET BY MOUTH ONCE A WEEK. TAKE WITH A FULL  GLASS OF WATER ON AN EMPTY  STOMACH., Disp: 12 tablet, Rfl: 0 .  atorvastatin (LIPITOR) 40 MG tablet, Take 1 tablet (40 mg total) by mouth daily., Disp: 90 tablet, Rfl: 0 .  benazepril (LOTENSIN) 40 MG tablet, Take 1 tablet (40 mg total) by mouth daily., Disp: 90 tablet, Rfl: 0 .  diclofenac sodium (VOLTAREN) 1 % GEL, Apply 2 g topically 4 (four) times daily., Disp: 100 g, Rfl: 0 .  gabapentin (NEURONTIN) 300 MG capsule, Take 1 capsule (300 mg total) by mouth at bedtime., Disp: 90 capsule,  Rfl: 1 .  hydrochlorothiazide (HYDRODIURIL) 25 MG tablet, Take 1 tablet (25 mg total) by mouth daily., Disp: 90 tablet, Rfl: 0 .  latanoprost (XALATAN) 0.005 % ophthalmic solution, Place 1 drop into both eyes at bedtime. , Disp: , Rfl:  .  levothyroxine (SYNTHROID, LEVOTHROID) 50 MCG tablet, Take 1 tablet (50 mcg total) by mouth daily., Disp: 90 tablet, Rfl: 0 .  mesalamine (LIALDA) 1.2 g EC tablet, Take 1 tablet (1.2 g total) by mouth QID., Disp: 180 tablet, Rfl: 1 .  Multiple Vitamin (MULTIVITAMIN) tablet, Take 1 tablet by mouth daily., Disp: , Rfl:  .  sertraline (ZOLOFT) 50 MG tablet, Take 1 tablet (50 mg total) by mouth  daily., Disp: 90 tablet, Rfl: 0 .  valACYclovir (VALTREX) 1000 MG tablet, Take 1 tablet (1,000 mg total) by mouth 2 (two) times daily., Disp: 30 tablet, Rfl: 0   Family History  Problem Relation Age of Onset  . Heart disease Mother   . Heart attack Mother   . Breast cancer Sister   . Alcohol abuse Father   . Cerebrovascular Accident Father   . Hypertension Unknown   . Diabetes Unknown   . Breast cancer Sister 36  . Heart attack Other 39     Social History   Tobacco Use  . Smoking status: Never Smoker  . Smokeless tobacco: Never Used  Substance Use Topics  . Alcohol use: No    Alcohol/week: 0.0 oz  . Drug use: No    Allergies as of 02/16/2018 - Review Complete 02/16/2018  Allergen Reaction Noted  . Other Palpitations 11/07/2012    Review of Systems:    All systems reviewed and negative except where noted in HPI.   Physical Exam:  BP 108/64 (BP Location: Left Arm, Patient Position: Sitting, Cuff Size: Normal)   Pulse 72   Resp 16   Ht 5' (1.524 m)   Wt 134 lb (60.8 kg)   BMI 26.17 kg/m  No LMP recorded. Patient is postmenopausal.  General:   Alert,  Well-developed, well-nourished, pleasant and cooperative in NAD Head:  Normocephalic and atraumatic. Eyes:  Sclera clear, no icterus.   Conjunctiva pink. Ears:  Normal auditory acuity. Nose:  No deformity, discharge, or lesions. Mouth:  No deformity or lesions,oropharynx pink & moist. Neck:  Supple; no masses or thyromegaly. Lungs:  Respirations even and unlabored.  Clear throughout to auscultation.   No wheezes, crackles, or rhonchi. No acute distress. Heart:  Regular rate and rhythm; no murmurs, clicks, rubs, or gallops. Abdomen:  Normal bowel sounds.  No bruits.  Soft, nontender and non-distended without masses, hepatosplenomegaly or hernias noted.  No guarding or rebound tenderness.   Rectal: Nor performed Msk:  Symmetrical without gross deformities. Good, equal movement & strength bilaterally. Pulses:  Normal  pulses noted. Extremities:  No clubbing or edema.  No cyanosis. Neurologic:  Alert and oriented x3;  grossly normal neurologically. Skin:  Intact without significant lesions or rashes. No jaundice. Psych:  Alert and cooperative. Normal mood and affect.  Imaging Studies: MRI reviewed  Assessment and Plan:   Carolyn Campbell is a 62 y.o. African-American female with Terminal ileal and colonic Crohn's, diagnosed in 2015 who was previously maintained on Imuran 50 mg daily with mild intermittent flareups about twice a year needing prednisone. She does not have any endoscopy evidence of ileocolonic Crohn's disease other than a few scattered aphthae in the ascending colon on colonoscopy in 05/2017 but histology revealed chronic mild active ileitis and right-sided  colitis. MR enterography did not inflammation in small bowel. I tried to give her budesonide but she could not afford high co-pay of $500 for 30 days supply. Currently, she is on on Lialda 2.4 g twice a day and she is in clinical remission. She stopped Imuran by herself.  continue Lialda 4.8 g daily  Recheck labs during next visit  IBD Health Maintenance  1.TB status: Gold quantiferon negative on 12/06/2014 2. Anemia: mild normocytic anemia, vitamin B12 and ferritin levels are normal 3.Immunizations: Hep A and B not immune,received 1 dose in 06/2017, Influenza vaccine received in 06/2017, prevnar  Received in 06/2017, pneumovax not received, recommend 4.Cancer screening I) Colon cancer/dysplasia surveillance: Colonoscopy 05/2017, no evidence of dysplasia and no polyps identified. Repeat colonoscopy in 5 years II) Cervical cancer: n/a III) Skin cancer - n/a  5.Bone health Vitamin D status: Normal Bone density testing: Not done 5. Labs: in 3 months 6. Smoking: Never smoked 7. NSAIDs and Antibiotics use: Denies NSAID use and antibiotic use  Follow up in 4 weeks   [name], MD

## 2018-02-17 ENCOUNTER — Other Ambulatory Visit: Payer: Self-pay

## 2018-02-17 ENCOUNTER — Encounter: Payer: Self-pay | Admitting: Family Medicine

## 2018-02-17 DIAGNOSIS — F325 Major depressive disorder, single episode, in full remission: Secondary | ICD-10-CM

## 2018-02-17 DIAGNOSIS — M5430 Sciatica, unspecified side: Secondary | ICD-10-CM

## 2018-02-17 DIAGNOSIS — R600 Localized edema: Secondary | ICD-10-CM

## 2018-02-17 DIAGNOSIS — E038 Other specified hypothyroidism: Secondary | ICD-10-CM

## 2018-02-17 DIAGNOSIS — I10 Essential (primary) hypertension: Secondary | ICD-10-CM

## 2018-02-17 DIAGNOSIS — G8929 Other chronic pain: Secondary | ICD-10-CM

## 2018-02-17 DIAGNOSIS — I700 Atherosclerosis of aorta: Secondary | ICD-10-CM

## 2018-02-17 DIAGNOSIS — M5442 Lumbago with sciatica, left side: Secondary | ICD-10-CM

## 2018-02-17 DIAGNOSIS — E7800 Pure hypercholesterolemia, unspecified: Secondary | ICD-10-CM

## 2018-02-17 MED ORDER — SERTRALINE HCL 50 MG PO TABS: 50.0000 mg | ORAL_TABLET | Freq: Every day | ORAL | 0 refills | Status: DC

## 2018-02-17 MED ORDER — HYDROCHLOROTHIAZIDE 25 MG PO TABS: 25.0000 mg | ORAL_TABLET | Freq: Every day | ORAL | 0 refills | Status: DC

## 2018-02-17 MED ORDER — BENAZEPRIL HCL 40 MG PO TABS: 40.0000 mg | ORAL_TABLET | Freq: Every day | ORAL | 0 refills | Status: DC

## 2018-02-17 MED ORDER — LEVOTHYROXINE SODIUM 50 MCG PO TABS: 50.0000 ug | ORAL_TABLET | Freq: Every day | ORAL | 0 refills | Status: DC

## 2018-02-17 MED ORDER — ATORVASTATIN CALCIUM 40 MG PO TABS: 40.0000 mg | ORAL_TABLET | Freq: Every day | ORAL | 0 refills | Status: DC

## 2018-02-17 MED ORDER — GABAPENTIN 300 MG PO CAPS: 300.0000 mg | ORAL_CAPSULE | Freq: Every day | ORAL | 0 refills | Status: DC

## 2018-02-17 NOTE — Telephone Encounter (Signed)
Patient insurance requires her to use [facility]. Patient asked we reroute them to [facility] instead of [facility].

## 2018-02-26 ENCOUNTER — Other Ambulatory Visit: Payer: Self-pay | Admitting: Nurse Practitioner

## 2018-02-26 DIAGNOSIS — I10 Essential (primary) hypertension: Secondary | ICD-10-CM

## 2018-03-07 ENCOUNTER — Other Ambulatory Visit: Payer: Self-pay | Admitting: Family Medicine

## 2018-03-07 DIAGNOSIS — F325 Major depressive disorder, single episode, in full remission: Secondary | ICD-10-CM

## 2018-03-28 ENCOUNTER — Ambulatory Visit (INDEPENDENT_AMBULATORY_CARE_PROVIDER_SITE_OTHER): Payer: Medicare Other | Admitting: Nurse Practitioner

## 2018-03-28 ENCOUNTER — Encounter: Payer: Self-pay | Admitting: Nurse Practitioner

## 2018-03-28 VITALS — BP 100/58 | HR 93 | Temp 98.1°F | Resp 16 | Ht 60.0 in | Wt 134.4 lb

## 2018-03-28 DIAGNOSIS — I69359 Hemiplegia and hemiparesis following cerebral infarction affecting unspecified side: Secondary | ICD-10-CM | POA: Diagnosis not present

## 2018-03-28 DIAGNOSIS — K50811 Crohn's disease of both small and large intestine with rectal bleeding: Secondary | ICD-10-CM | POA: Diagnosis not present

## 2018-03-28 DIAGNOSIS — I10 Essential (primary) hypertension: Secondary | ICD-10-CM | POA: Diagnosis not present

## 2018-03-28 DIAGNOSIS — B001 Herpesviral vesicular dermatitis: Secondary | ICD-10-CM

## 2018-03-28 DIAGNOSIS — H4053X2 Glaucoma secondary to other eye disorders, bilateral, moderate stage: Secondary | ICD-10-CM

## 2018-03-28 DIAGNOSIS — Q159 Congenital malformation of eye, unspecified: Secondary | ICD-10-CM | POA: Diagnosis not present

## 2018-03-28 DIAGNOSIS — F325 Major depressive disorder, single episode, in full remission: Secondary | ICD-10-CM

## 2018-03-28 DIAGNOSIS — I700 Atherosclerosis of aorta: Secondary | ICD-10-CM

## 2018-03-28 DIAGNOSIS — M8580 Other specified disorders of bone density and structure, unspecified site: Secondary | ICD-10-CM

## 2018-03-28 DIAGNOSIS — E7800 Pure hypercholesterolemia, unspecified: Secondary | ICD-10-CM | POA: Diagnosis not present

## 2018-03-28 DIAGNOSIS — R413 Other amnesia: Secondary | ICD-10-CM | POA: Diagnosis not present

## 2018-03-28 MED ORDER — HYDROCHLOROTHIAZIDE 12.5 MG PO TABS: 12.5000 mg | ORAL_TABLET | Freq: Every day | ORAL | 0 refills | Status: DC

## 2018-03-28 MED ORDER — VALACYCLOVIR HCL 1 G PO TABS: 1000.0000 mg | ORAL_TABLET | Freq: Two times a day (BID) | ORAL | 5 refills | Status: DC

## 2018-03-28 MED ORDER — ALENDRONATE SODIUM 70 MG PO TABS: ORAL_TABLET | ORAL | 3 refills | Status: DC

## 2018-03-28 NOTE — Progress Notes (Addendum)
Name: Carolyn Campbell   MRN: [id number]    DOB: 1955-09-24   Date:03/28/2018       Progress Note  Subjective  Chief Complaint  Chief Complaint  Patient presents with  . Hypertension    HPI  Hypertension Patient is taking benazepril 43m HCTZ 25 mg a day.  Patient states feels lightheaded sometimes when she bends down or sits up. Denies chest pain, headaches, blurry vision, slurred speech.  Has cut down on unhealthy foods less sweet  BP Readings from Last 3 Encounters:  03/28/18 (!) 100/58  02/16/18 108/64  01/17/18 131/67   CVA/ Atherosclerosis  blood pressure meds- controlled, atorvastatin every night   Chron's  5/30 appt with Dr. VMarius Ditch increased dose of lialda; she had identified chocolate as a triggers, states overall well managed  Depression zoloft every day, states is well-controlled. Does not see counselor. [facility] 03/28/2018 12/21/2017 06/22/2017  Decreased Interest 0 0 0  Down, Depressed, Hopeless 0 0 1  PHQ - 2 Score 0 0 1  Altered sleeping 1 1 -  Tired, decreased energy 1 2 -  Change in appetite 0 0 -  Feeling bad or failure about yourself  0 0 -  Trouble concentrating 0 0 -  Moving slowly or fidgety/restless 0 - -  Suicidal thoughts 0 0 -  PHQ-9 Score 2 3 -  Difficult doing work/chores Not difficult at all - -   Memory issue States sometimes is talking and forgets her train of thought, states husband has noticed it too. Has never been driving and forgot where she was going, or left cooking unattended. Has not affected her being able to hold conversations or complete tasks. 6CIT Screen 03/28/2018  What Year? 0 points  What month? 0 points  What time? 0 points  Count back from 20 0 points  Months in reverse 0 points  Repeat phrase 0 points  Total Score 0   Osteopenia is on fosamax, takes multivitamin; walks around- tries to go to the gym three times a week.  Lab Results  Component Value Date   CREATININE 0.82 12/28/2017   Lab Results   Component Value Date   CALCIUM 9.6 12/28/2017    Glaucoma Has an appointment with opthalmologist this week; sees him every 3-4 months.   Patient Active Problem List   Diagnosis Date Noted  . Arthritis of right hand 12/21/2017  . Immunosuppressed status ([place] 12/21/2017  . Major depression in remission ([place] 12/21/2017  . Fibrocystic breast changes 06/22/2017  . Dysphagia as late effect of stroke 03/20/2017  . Osteopenia 10/18/2016  . Atherosclerosis of aorta ([place] 12/19/2015  . Vitreous degeneration 05/21/2015  . Bad memory 05/21/2015  . Chronic ulcerative proctitis ([facility] 05/21/2015  . Glaucoma associated with chamber angle anomalies 05/21/2015  . Dermatitis, eczematoid 05/21/2015  . CD (Crohn's disease) ([place] 05/21/2015  . H/O malignant neoplasm of breast 05/21/2015  . Crohn's disease of both small and large intestine with rectal bleeding ([place] 12/04/2014  . Solitary pulmonary nodule 11/07/2012  . 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 Cognitive deficits as late effect of cerebrovascular disease 01/20/2010  . CVA, old, hemiparesis ([place] 04/28/2009  . Adult hypothyroidism 06/12/2008  . Acid reflux 05/23/2007  . Hypercholesterolemia without hypertriglyceridemia 01/18/2007  . Benign essential HTN 01/18/2007    Past Medical History:  Diagnosis Date  . Allergic rhinitis   . Anginal pain ([place]    states MD said it was GERD  . Arthritis   .  Asthma   . Breast cancer ([place]) 2006   LT LUMPECTOMY  . Cognitive deficits as late effect of cerebrovascular disease   . COPD (chronic obstructive pulmonary disease) ([place])   . Crohn's disease of both small and large intestine with rectal bleeding ([place]) 12/04/2014  . 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 Dyspnea   . Esophageal reflux   . Glaucoma    vitreous degeneration  . History of kidney stones   . Hyperlipidemia   . Hypertension   . Hypothyroidism   . Occlusion, cerebral artery     NOS w/infarction  . Osteoporosis   . Personal history of radiation therapy 2006   BREAST CA  . Stroke [facility])     Past Surgical History:  Procedure Laterality Date  . BREAST BIOPSY Left 2006   POS  . BREAST LUMPECTOMY Left 09/20/2004   positive  . BREAST SURGERY Left    malignant biopsy  . CARDIAC CATHETERIZATION    . COLONOSCOPY  2015  . COLONOSCOPY WITH PROPOFOL N/A 06/08/2017   Procedure: COLONOSCOPY WITH PROPOFOL;  Surgeon: Lin Landsman, MD;  Location: [facility];  Service: Gastroenterology;  Laterality: N/A;  . ESOPHAGOGASTRODUODENOSCOPY  2015  . ESOPHAGOGASTRODUODENOSCOPY (EGD) WITH PROPOFOL N/A 06/08/2017   Procedure: ESOPHAGOGASTRODUODENOSCOPY (EGD) WITH PROPOFOL;  Surgeon: Lin Landsman, MD;  Location: [place];  Service: Gastroenterology;  Laterality: N/A;  . FINGER SURGERY     Right small finger  . [place] STUDY  2015  . TUBAL LIGATION      Social History   Tobacco Use  . Smoking status: Never Smoker  . Smokeless tobacco: Never Used  Substance Use Topics  . Alcohol use: No    Alcohol/week: 0.0 oz     Current Outpatient Medications:  .  alendronate (FOSAMAX) 70 MG tablet, TAKE 1 TABLET BY MOUTH ONCE A WEEK. TAKE WITH A FULL  GLASS OF WATER ON AN EMPTY  STOMACH., Disp: 12 tablet, Rfl: 0 .  atorvastatin (LIPITOR) 40 MG tablet, Take 1 tablet (40 mg total) by mouth daily., Disp: 90 tablet, Rfl: 0 .  benazepril (LOTENSIN) 40 MG tablet, TAKE 1 TABLET BY MOUTH  DAILY, Disp: 90 tablet, Rfl: 0 .  diclofenac sodium (VOLTAREN) 1 % GEL, Apply 2 g topically 4 (four) times daily., Disp: 100 g, Rfl: 0 .  gabapentin (NEURONTIN) 300 MG capsule, Take 1 capsule (300 mg total) by mouth at bedtime., Disp: 90 capsule, Rfl: 0 .  hydrochlorothiazide (HYDRODIURIL) 25 MG tablet, Take 1 tablet (25 mg total) by mouth daily., Disp: 90 tablet, Rfl: 0 .  latanoprost (XALATAN) 0.005 % ophthalmic solution, Place 1 drop into both eyes at bedtime. , Disp: , Rfl:   .  levothyroxine (SYNTHROID, LEVOTHROID) 50 MCG tablet, Take 1 tablet (50 mcg total) by mouth daily., Disp: 90 tablet, Rfl: 0 .  mesalamine (LIALDA) 1.2 g EC tablet, Take 1 tablet (1.2 g total) by mouth QID., Disp: 180 tablet, Rfl: 1 .  Multiple Vitamin (MULTIVITAMIN) tablet, Take 1 tablet by mouth daily., Disp: , Rfl:  .  sertraline (ZOLOFT) 50 MG tablet, Take 1 tablet (50 mg total) by mouth daily., Disp: 90 tablet, Rfl: 0 .  valACYclovir (VALTREX) 1000 MG tablet, Take 1 tablet (1,000 mg total) by mouth 2 (two) times daily., Disp: 30 tablet, Rfl: 0  Allergies  Allergen Reactions  . Other Palpitations    ROS   Constitutional: Negative for fever or weight change.  Respiratory: Negative for cough  and shortness of breath.   Cardiovascular: Negative for chest pain or palpitations.  Gastrointestinal: Negative for abdominal pain, no bowel changes.  Musculoskeletal: Negative for gait problem or joint swelling.  Skin: Negative for rash.  Neurological: Negative for dizziness or headache.  No other specific complaints in a complete review of systems (except as listed in HPI above).  Objective  Vitals:   03/28/18 0955  BP: (!) 100/58  Pulse: 93  Resp: 16  Temp: 98.1 F (36.7 C)  TempSrc: Oral  SpO2: 96%  Weight: 134 lb 6.4 oz (61 kg)  Height: 5' (1.524 m)     Body mass index is 26.25 kg/m.  Nursing Note and Vital Signs reviewed.  Physical Exam   Constitutional: Patient appears well-developed and well-nourished.  No distress.  HEENT: head atraumatic, normocephalic, pupils equal and reactive to light, Cardiovascular: Normal rate, regular rhythm, S1/S2 present.  No murmur or rub heard.  Pulmonary/Chest: Effort normal and breath sounds clear. No respiratory distress or retractions. Abdominal: Soft and non-tender, bowel sounds present  MSK: full ROM, no swelling or obvious deformities noted  Psychiatric: Patient has a normal mood and affect. behavior is normal. Judgment and  thought content normal.  No results found for this or any previous visit (from the past 72 hour(s)).  Assessment & Plan  1. Benign essential HTN Decreased HCTZ - symptomatic; no leg swelling  - hydrochlorothiazide (HYDRODIURIL) 12.5 MG tablet; Take 1 tablet (12.5 mg total) by mouth daily.  Dispense: 90 tablet; Refill: 0  2. Fever blister stable - valACYclovir (VALTREX) 1000 MG tablet; Take 1 tablet (1,000 mg total) by mouth 2 (two) times daily.  Dispense: 30 tablet; Refill: 5  3. Crohn's disease of both small and large intestine with rectal bleeding (HCC) -stable; GI   4. Hypercholesterolemia without hypertriglyceridemia -stable continue meds and improved diet  5. CVA, old, hemiparesis ([place]) - cont bp and cholesterol management   6. Bad memory -stable, discussed memory games, diet, and sleep and referral to memory clinic if needed  7. 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 Atherosclerosis of aorta (HCC) BP control   9. Osteopenia, unspecified location Weight bearing, vitamin d - alendronate (FOSAMAX) 70 MG tablet; TAKE 1 TABLET BY MOUTH ONCE A WEEK. TAKE WITH A FULL  GLASS OF WATER ON AN EMPTY  STOMACH.  Dispense: 12 tablet; Refill: 3  10. Major depression in remission ([place]) -stable cont meds   Follow up and care instructions discussed and provided in AVS.  ------------------------------------------ I have reviewed this encounter including the documentation in this note and/or discussed this patient with the provider, Suezanne Cheshire DNP AGNP-C. I am certifying that I agree with the content of this note as supervising physician. Enid Derry, [facility] 03/29/2018, 4:37 PM

## 2018-03-28 NOTE — Patient Instructions (Addendum)
Our goal for you blood pressure is to be between (100-130)/ (60-80)  Since you are having some lightheadedness with position changes and your last two blood pressures have been on the lower end of normal we will try to cut down you HCTZ to see if that helps with your symptoms. - TAKE 12.5mg  of HCTZ a day instead of 25mg  of HCTZ. You can cut this pill in half until you get your new presciption for the 12.5mg  dose.  - Check your blood pressures at home 3 times a week and call us next week to let us know where they are.  To help with memory: - eat less sugar - add more fish to diet or take fish oils - get enough sleep - train your brain with cognitive skills games- Crosswords, word-recall games, Tetris and even mobile apps dedicated to memory training are excellent ways to strengthen memory.

## 2018-04-11 ENCOUNTER — Ambulatory Visit (INDEPENDENT_AMBULATORY_CARE_PROVIDER_SITE_OTHER): Payer: Medicare Other | Admitting: Family Medicine

## 2018-04-11 ENCOUNTER — Encounter: Payer: Self-pay | Admitting: Family Medicine

## 2018-04-11 VITALS — BP 118/64 | HR 83 | Temp 99.1°F | Resp 14 | Ht 60.0 in | Wt 137.9 lb

## 2018-04-11 DIAGNOSIS — I700 Atherosclerosis of aorta: Secondary | ICD-10-CM | POA: Diagnosis not present

## 2018-04-11 DIAGNOSIS — I10 Essential (primary) hypertension: Secondary | ICD-10-CM

## 2018-04-11 NOTE — Patient Instructions (Addendum)
-   Check your blood pressure 2-3 times a week, and bring the log in to the next visit.  - Your goal blood pressure: (110-130)/ (60-80) If you are below or above this, please call our office.  - If you have lightheadedness, near-passing out, or significant tiredness, call our office.

## 2018-04-11 NOTE — Progress Notes (Signed)
Name: Carolyn Campbell   MRN: [id number]    DOB: 31-Dec-1955   Date:04/11/2018       Progress Note  Subjective  Chief Complaint  Chief Complaint  Patient presents with  . Follow-up    2 week recheck low BP    HPI  PT presents for 2 week BP check. She was seen for low BP with lightheadedness and some fatigue.  She does get occasional BLE edema, so she'd like to stay on HCTZ even at the lower dose of 12.5 that she was switched to at her last visit.  Home BP's: 128/61, 125/58 - she reports feeling a little tired when she had these BP's. She reports lightheadedness has improved but still occurs sometimes; also reports some fatigue but this has also improved.   Current medication: 12.5mg  HCTZ, 40mg  Benazapril. Dietary: She has been eating fruit and vegetables; avoiding salt in her diet.  Atherosclerosis: Taking statin medication; she has Crohn's disease, and aspirin is contraindicated.  Patient Active Problem List   Diagnosis Date Noted  . Arthritis of right hand 12/21/2017  . Immunosuppressed status ([place]) 12/21/2017  . Major depression in remission ([place]) 12/21/2017  . Fibrocystic breast changes 06/22/2017  . Dysphagia as late effect of stroke 03/20/2017  . Osteopenia 10/18/2016  . Atherosclerosis of aorta ([place]) 12/19/2015  . Vitreous degeneration 05/21/2015  . Bad memory 05/21/2015  . Chronic ulcerative proctitis ([place]) 05/21/2015  . Glaucoma associated with chamber angle anomalies 05/21/2015  . Dermatitis, eczematoid 05/21/2015  . CD (Crohn's disease) ([place]) 05/21/2015  . H/O malignant neoplasm of breast 05/21/2015  . Crohn's disease of both small and large intestine with rectal bleeding ([place]) 12/04/2014  . Solitary pulmonary nodule 11/07/2012  . 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 Cognitive deficits as late effect of cerebrovascular disease 01/20/2010  . CVA, old, hemiparesis ([place]) 04/28/2009  . Adult hypothyroidism 06/12/2008  . Acid reflux  05/23/2007  . Hypercholesterolemia without hypertriglyceridemia 01/18/2007  . Benign essential HTN 01/18/2007    Social History   Tobacco Use  . Smoking status: Never Smoker  . Smokeless tobacco: Never Used  Substance Use Topics  . Alcohol use: No    Alcohol/week: 0.0 oz     Current Outpatient Medications:  .  alendronate (FOSAMAX) 70 MG tablet, TAKE 1 TABLET BY MOUTH ONCE A WEEK. TAKE WITH A FULL  GLASS OF WATER ON AN EMPTY  STOMACH., Disp: 12 tablet, Rfl: 3 .  atorvastatin (LIPITOR) 40 MG tablet, Take 1 tablet (40 mg total) by mouth daily., Disp: 90 tablet, Rfl: 0 .  benazepril (LOTENSIN) 40 MG tablet, TAKE 1 TABLET BY MOUTH  DAILY, Disp: 90 tablet, Rfl: 0 .  diclofenac sodium (VOLTAREN) 1 % GEL, Apply 2 g topically 4 (four) times daily., Disp: 100 g, Rfl: 0 .  gabapentin (NEURONTIN) 300 MG capsule, Take 1 capsule (300 mg total) by mouth at bedtime., Disp: 90 capsule, Rfl: 0 .  hydrochlorothiazide (HYDRODIURIL) 12.5 MG tablet, Take 1 tablet (12.5 mg total) by mouth daily., Disp: 90 tablet, Rfl: 0 .  latanoprost (XALATAN) 0.005 % ophthalmic solution, Place 1 drop into both eyes at bedtime. , Disp: , Rfl:  .  levothyroxine (SYNTHROID, LEVOTHROID) 50 MCG tablet, Take 1 tablet (50 mcg total) by mouth daily., Disp: 90 tablet, Rfl: 0 .  mesalamine (LIALDA) 1.2 g EC tablet, Take 1 tablet (1.2 g total) by mouth QID., Disp: 180 tablet, Rfl: 1 .  Multiple Vitamin (MULTIVITAMIN) tablet, Take 1 tablet  by mouth daily., Disp: , Rfl:  .  sertraline (ZOLOFT) 50 MG tablet, Take 1 tablet (50 mg total) by mouth daily., Disp: 90 tablet, Rfl: 0 .  valACYclovir (VALTREX) 1000 MG tablet, Take 1 tablet (1,000 mg total) by mouth 2 (two) times daily., Disp: 30 tablet, Rfl: 5  Allergies  Allergen Reactions  . Other Palpitations    ROS  Constitutional: Negative for fever or weight change.  Respiratory: Negative for cough and shortness of breath.   Cardiovascular: Negative for chest pain or palpitations.   Gastrointestinal: Negative for abdominal pain, no bowel changes.  Musculoskeletal: Negative for gait problem or joint swelling.  Skin: Negative for rash.  Neurological: Negative for dizziness or headache.  No other specific complaints in a complete review of systems (except as listed in HPI above).  Objective  Vitals:   04/11/18 0944  BP: 118/64  Pulse: 83  Resp: 14  Temp: 99.1 F (37.3 C)  TempSrc: Oral  SpO2: 96%  Weight: 137 lb 14.4 oz (62.6 kg)  Height: 5' (1.524 m)   Body mass index is 26.93 kg/m.  Nursing Note and Vital Signs reviewed.  Physical Exam  Constitutional: She is oriented to person, place, and time. She appears well-developed and well-nourished. No distress.  HENT:  Head: Normocephalic and atraumatic.  Right Ear: External ear normal.  Left Ear: External ear normal.  Nose: Nose normal.  Eyes: Conjunctivae and EOM are normal.  Neck: Normal range of motion. No JVD present.  Cardiovascular: Normal rate, regular rhythm and normal heart sounds.  Pulmonary/Chest: Effort normal and breath sounds normal.  Abdominal: Soft. Bowel sounds are normal.  Musculoskeletal: Normal range of motion. She exhibits no edema or tenderness.  Neurological: She is alert and oriented to person, place, and time. No cranial nerve deficit.  Skin: Skin is warm and dry. No rash noted.  Psychiatric: She has a normal mood and affect. Her behavior is normal. Judgment and thought content normal.  Nursing note and vitals reviewed.  No results found for this or any previous visit (from the past 72 hour(s)).  Assessment & Plan  1. Benign essential HTN Continue 12.5mg  HCTZ and 40mg  Benazepril DASH diet discussed.  2. Atherosclerosis of aorta (HCC) Continue statin therapy; antiplatelet therapy is not indicated due to Crohn's dz.  Return in about 1 month (around 05/12/2018) for Follow Up.  -Red flags and when to present for emergency care or RTC including fever >101.51F, chest pain,  shortness of breath, new/worsening/un-resolving symptoms, near-syncope reviewed with patient at time of visit. Follow up and care instructions discussed and provided in AVS.

## 2018-04-14 DIAGNOSIS — H401132 Primary open-angle glaucoma, bilateral, moderate stage: Secondary | ICD-10-CM | POA: Diagnosis not present

## 2018-04-14 DIAGNOSIS — H2513 Age-related nuclear cataract, bilateral: Secondary | ICD-10-CM | POA: Diagnosis not present

## 2018-04-26 ENCOUNTER — Telehealth: Payer: Self-pay

## 2018-04-26 NOTE — Telephone Encounter (Signed)
How is she feeling? Is she lightheaded or dizzy? If she is experiencing any of these symptoms I want to see her tomorrow in the office. If she is having chest pain, shortness of breath or any severe symptoms she needs to go to the hospital.  If she is not experiencing these symptoms, I recommend she cut her benazepril in half and take 20mg  at night along with her 12.5mg  HCTZ in the morning. Have her do this and come in Friday for a BP check.

## 2018-04-26 NOTE — Telephone Encounter (Signed)
Thank you for calling. Let's do that and return on Friday for BP check.

## 2018-04-26 NOTE — Telephone Encounter (Signed)
Patient states she is not experiencing any dizziness or lightheaded. She is only very tired and if she bends over will get dizzy for a short period of time. She would like to try half of both for the next couple of days and come in Friday for BP check.

## 2018-04-26 NOTE — Telephone Encounter (Signed)
Copied from [place] [id number]. Topic: General - Other >> Apr 25, 2018  3:21 PM Keene Breath wrote: Reason for CRM: Patient called to inform doctor that her BP is still running low.  Today it was 125/57.  Patient said doctor wanted her to call if it continued to be low.  Please advise.  CB# [phone].

## 2018-04-28 ENCOUNTER — Ambulatory Visit: Payer: Medicare Other | Admitting: Emergency Medicine

## 2018-04-28 VITALS — BP 122/68

## 2018-04-28 DIAGNOSIS — I10 Essential (primary) hypertension: Secondary | ICD-10-CM

## 2018-04-28 NOTE — Progress Notes (Signed)
Advised to stop HCTZ as BP remains low-end of normal today and continue Benazepril 20mg  (1/2 tablet) daily. Return in 1 week for BP check.

## 2018-05-12 ENCOUNTER — Ambulatory Visit (INDEPENDENT_AMBULATORY_CARE_PROVIDER_SITE_OTHER): Payer: Medicare Other | Admitting: Family Medicine

## 2018-05-12 ENCOUNTER — Encounter: Payer: Self-pay | Admitting: Family Medicine

## 2018-05-12 VITALS — BP 118/70 | HR 88 | Temp 98.7°F | Resp 14 | Ht 60.0 in | Wt 137.8 lb

## 2018-05-12 DIAGNOSIS — Z23 Encounter for immunization: Secondary | ICD-10-CM | POA: Diagnosis not present

## 2018-05-12 DIAGNOSIS — I10 Essential (primary) hypertension: Secondary | ICD-10-CM | POA: Diagnosis not present

## 2018-05-12 NOTE — Progress Notes (Signed)
Name: Carolyn Campbell   MRN: [id number]    DOB: 1956/04/27   Date:05/12/2018       Progress Note  Subjective  Chief Complaint  Chief Complaint  Patient presents with  . Follow-up    1 month recheck BP    HPI  Pt presents for BP follow up - she is again low end of normal today, still reporting lightheadedness especially with standing.  Came for 2 week BP check on 04/28/18 and BP was 122/68.  She does not want to stop HCTZ due to occasional BLE edema.  We will decrease benazepril to 20mg  daily and keep 12.5mg  HCTZ.  She denies chest pain, shortness of breath, headaches, vision changes (does note that she has glaucoma). BP's at home have been 120's/60's.  Patient Active Problem List   Diagnosis Date Noted  . Arthritis of right hand 12/21/2017  . Immunosuppressed status ([place]) 12/21/2017  . Major depression in remission ([place]) 12/21/2017  . Fibrocystic breast changes 06/22/2017  . Dysphagia as late effect of stroke 03/20/2017  . Osteopenia 10/18/2016  . Atherosclerosis of aorta ([place]) 12/19/2015  . Vitreous degeneration 05/21/2015  . Bad memory 05/21/2015  . Chronic ulcerative proctitis ([place]) 05/21/2015  . Glaucoma associated with chamber angle anomalies 05/21/2015  . Dermatitis, eczematoid 05/21/2015  . CD (Crohn's disease) ([place]) 05/21/2015  . H/O malignant neoplasm of breast 05/21/2015  . Crohn's disease of both small and large intestine with rectal bleeding ([place]) 12/04/2014  . Solitary pulmonary nodule 11/07/2012  . 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 Cognitive deficits as late effect of cerebrovascular disease 01/20/2010  . CVA, old, hemiparesis ([place]) 04/28/2009  . Adult hypothyroidism 06/12/2008  . Acid reflux 05/23/2007  . Hypercholesterolemia without hypertriglyceridemia 01/18/2007  . Benign essential HTN 01/18/2007    Past Surgical History:  Procedure Laterality Date  . BREAST BIOPSY Left 2006   POS  . BREAST LUMPECTOMY Left 09/20/2004   positive  . BREAST SURGERY Left    malignant biopsy  . CARDIAC CATHETERIZATION    . COLONOSCOPY  2015  . COLONOSCOPY WITH PROPOFOL N/A 06/08/2017   Procedure: COLONOSCOPY WITH PROPOFOL;  Surgeon: Lin Landsman, MD;  Location: [facility];  Service: Gastroenterology;  Laterality: N/A;  . ESOPHAGOGASTRODUODENOSCOPY  2015  . ESOPHAGOGASTRODUODENOSCOPY (EGD) WITH PROPOFOL N/A 06/08/2017   Procedure: ESOPHAGOGASTRODUODENOSCOPY (EGD) WITH PROPOFOL;  Surgeon: Lin Landsman, MD;  Location: [facility];  Service: Gastroenterology;  Laterality: N/A;  . FINGER SURGERY     Right small finger  . [place] STUDY  2015  . TUBAL LIGATION      Family History  Problem Relation Age of Onset  . Heart disease Mother   . Heart attack Mother   . Breast cancer Sister   . Alcohol abuse Father   . Cerebrovascular Accident Father   . Hypertension Unknown   . Diabetes Unknown   . Breast cancer Sister 35  . Heart attack Other 70    Social History   Socioeconomic History  . Marital status: Married    Spouse name: Not on file  . Number of children: Not on file  . Years of education: Not on file  . Highest education level: Not on file  Occupational History  . Not on file  Social Needs  . Financial resource strain: Not on file  . Food insecurity:    Worry: Not on file    Inability: Not on file  . Transportation needs:  Medical: Not on file    Non-medical: Not on file  Tobacco Use  . Smoking status: Never Smoker  . Smokeless tobacco: Never Used  Substance and Sexual Activity  . Alcohol use: No    Alcohol/week: 0.0 standard drinks  . Drug use: No  . Sexual activity: Yes    Partners: Male  Lifestyle  . Physical activity:    Days per week: Not on file    Minutes per session: Not on file  . Stress: Not on file  Relationships  . Social connections:    Talks on phone: Not on file    Gets together: Not on file    Attends religious service: Not on file     Active member of club or organization: Not on file    Attends meetings of clubs or organizations: Not on file    Relationship status: Not on file  . Intimate partner violence:    Fear of current or ex partner: Not on file    Emotionally abused: Not on file    Physically abused: Not on file    Forced sexual activity: Not on file  Other Topics Concern  . Not on file  Social History Narrative   Married. One son and one daughter. She is disabled after she had breast cancer and strokes. 2 caffeinated beverages daily.     Current Outpatient Medications:  .  alendronate (FOSAMAX) 70 MG tablet, TAKE 1 TABLET BY MOUTH ONCE A WEEK. TAKE WITH A FULL  GLASS OF WATER ON AN EMPTY  STOMACH., Disp: 12 tablet, Rfl: 3 .  atorvastatin (LIPITOR) 40 MG tablet, Take 1 tablet (40 mg total) by mouth daily., Disp: 90 tablet, Rfl: 0 .  benazepril (LOTENSIN) 40 MG tablet, TAKE 1 TABLET BY MOUTH  DAILY, Disp: 90 tablet, Rfl: 0 .  diclofenac sodium (VOLTAREN) 1 % GEL, Apply 2 g topically 4 (four) times daily., Disp: 100 g, Rfl: 0 .  gabapentin (NEURONTIN) 300 MG capsule, Take 1 capsule (300 mg total) by mouth at bedtime., Disp: 90 capsule, Rfl: 0 .  hydrochlorothiazide (HYDRODIURIL) 12.5 MG tablet, Take 1 tablet (12.5 mg total) by mouth daily., Disp: 90 tablet, Rfl: 0 .  latanoprost (XALATAN) 0.005 % ophthalmic solution, Place 1 drop into both eyes at bedtime. , Disp: , Rfl:  .  levothyroxine (SYNTHROID, LEVOTHROID) 50 MCG tablet, Take 1 tablet (50 mcg total) by mouth daily., Disp: 90 tablet, Rfl: 0 .  Multiple Vitamin (MULTIVITAMIN) tablet, Take 1 tablet by mouth daily., Disp: , Rfl:  .  sertraline (ZOLOFT) 50 MG tablet, Take 1 tablet (50 mg total) by mouth daily., Disp: 90 tablet, Rfl: 0 .  valACYclovir (VALTREX) 1000 MG tablet, Take 1 tablet (1,000 mg total) by mouth 2 (two) times daily., Disp: 30 tablet, Rfl: 5 .  mesalamine (LIALDA) 1.2 g EC tablet, Take 1 tablet (1.2 g total) by mouth QID., Disp: 180 tablet,  Rfl: 1  Allergies  Allergen Reactions  . Other Palpitations    ROS Constitutional: Negative for fever or weight change.  Respiratory: Negative for cough and shortness of breath.   Cardiovascular: Negative for chest pain or palpitations.  Gastrointestinal: Positive for intermittent abdominal pain (has appointment with GI upcoming for Crohn disease follow up), no bowel changes.  Musculoskeletal: Negative for gait problem or joint swelling.  Skin: Negative for rash.  Neurological: Positive for lightheadedness, negative for headache.  No other specific complaints in a complete review of systems (except as listed in HPI above).  Objective  Vitals:   05/12/18 0906  BP: 118/70  Pulse: 88  Resp: 14  Temp: 98.7 F (37.1 C)  TempSrc: Oral  SpO2: 96%  Weight: 137 lb 12.8 oz (62.5 kg)  Height: 5' (1.524 m)   Body mass index is 26.91 kg/m.  Physical Exam Constitutional: Patient appears well-developed and well-nourished. No distress.  HENT: Head: Normocephalic and atraumatic. Nose: Nose normal. Mouth/Throat: Oropharynx is clear and moist. No oropharyngeal exudate.  Eyes: Conjunctivae and EOM are normal. Pupils are equal, round, and reactive to light. No scleral icterus.  Neck: Normal range of motion. Neck supple. No JVD present. No thyromegaly present.  Cardiovascular: Normal rate, regular rhythm and normal heart sounds.  No murmur heard. No BLE edema. Pulmonary/Chest: Effort normal and breath sounds normal. No respiratory distress. Neurological: she is alert and oriented to person, place, and time. No cranial nerve deficit. Coordination, balance, strength, speech and gait are normal.  Skin: Skin is warm and dry. No rash noted. No erythema.  Psychiatric: Patient has a normal mood and affect. behavior is normal. Judgment and thought content normal.  No results found for this or any previous visit (from the past 72 hour(s)).  PHQ2/9: Depression screen [facility] 2/9 05/12/2018 04/11/2018  03/28/2018 12/21/2017 06/22/2017  Decreased Interest 0 0 0 0 0  Down, Depressed, Hopeless 0 0 0 0 1  PHQ - 2 Score 0 0 0 0 1  Altered sleeping 0 0 1 1 -  Tired, decreased energy 0 0 1 2 -  Change in appetite 0 0 0 0 -  Feeling bad or failure about yourself  0 0 0 0 -  Trouble concentrating 0 0 0 0 -  Moving slowly or fidgety/restless 0 0 0 - -  Suicidal thoughts 0 0 0 0 -  PHQ-9 Score 0 0 2 3 -  Difficult doing work/chores Not difficult at all Not difficult at all Not difficult at all - -   Fall Risk: Fall Risk  05/12/2018 04/11/2018 03/28/2018 06/22/2017 01/17/2017  Falls in the past year? Yes Yes No No No  Number falls in past yr: 1 1 - - -  Injury with Fall? No No - - -   Assessment & Plan  1. Benign essential HTN - benazepril (LOTENSIN) 20 MG tablet; Take 1 tablet (20 mg total) by mouth daily.  Dispense: 90 tablet; Refill: 0 - Decrease benazepril to 20mg  daily, continue HCTZ 12.5mg  daily.  Monitor BP at home and bring log to next visit. - If continuing to experience lightheadedness before next visit, she will call the office for further instruction.  2. Needs flu shot - Pt will return next week for flu shot

## 2018-05-17 ENCOUNTER — Other Ambulatory Visit: Payer: Self-pay | Admitting: Family Medicine

## 2018-05-17 ENCOUNTER — Other Ambulatory Visit: Payer: Self-pay | Admitting: Gastroenterology

## 2018-05-17 DIAGNOSIS — I10 Essential (primary) hypertension: Secondary | ICD-10-CM

## 2018-05-17 DIAGNOSIS — I700 Atherosclerosis of aorta: Secondary | ICD-10-CM

## 2018-05-17 DIAGNOSIS — M5430 Sciatica, unspecified side: Secondary | ICD-10-CM

## 2018-05-17 DIAGNOSIS — F325 Major depressive disorder, single episode, in full remission: Secondary | ICD-10-CM

## 2018-05-17 DIAGNOSIS — M5442 Lumbago with sciatica, left side: Secondary | ICD-10-CM

## 2018-05-17 DIAGNOSIS — E038 Other specified hypothyroidism: Secondary | ICD-10-CM

## 2018-05-17 DIAGNOSIS — M26621 Arthralgia of right temporomandibular joint: Secondary | ICD-10-CM

## 2018-05-17 DIAGNOSIS — G8929 Other chronic pain: Secondary | ICD-10-CM

## 2018-05-17 DIAGNOSIS — E7800 Pure hypercholesterolemia, unspecified: Secondary | ICD-10-CM

## 2018-05-17 MED ORDER — BENAZEPRIL HCL 20 MG PO TABS: 20.0000 mg | ORAL_TABLET | Freq: Every day | ORAL | 0 refills | Status: DC

## 2018-05-18 ENCOUNTER — Other Ambulatory Visit: Payer: Self-pay

## 2018-05-18 MED ORDER — DICLOFENAC SODIUM 1 % TD GEL: 2.0000 g | Freq: Four times a day (QID) | TRANSDERMAL | 0 refills | Status: DC

## 2018-05-18 MED ORDER — ATORVASTATIN CALCIUM 40 MG PO TABS: 40.0000 mg | ORAL_TABLET | Freq: Every day | ORAL | 0 refills | Status: DC

## 2018-05-18 MED ORDER — GABAPENTIN 300 MG PO CAPS: 300.0000 mg | ORAL_CAPSULE | Freq: Every day | ORAL | 0 refills | Status: DC

## 2018-05-18 MED ORDER — MESALAMINE 1.2 G PO TBEC: 1.2000 g | DELAYED_RELEASE_TABLET | Freq: Four times a day (QID) | ORAL | 0 refills | Status: DC

## 2018-05-18 MED ORDER — SERTRALINE HCL 50 MG PO TABS: 50.0000 mg | ORAL_TABLET | Freq: Every day | ORAL | 0 refills | Status: DC

## 2018-05-18 MED ORDER — MESALAMINE 1.2 G PO TBEC: 1.2000 g | DELAYED_RELEASE_TABLET | Freq: Four times a day (QID) | ORAL | 1 refills | Status: DC

## 2018-05-18 MED ORDER — LEVOTHYROXINE SODIUM 50 MCG PO TABS: 50.0000 ug | ORAL_TABLET | Freq: Every day | ORAL | 0 refills | Status: DC

## 2018-05-23 ENCOUNTER — Encounter: Payer: Self-pay | Admitting: Gastroenterology

## 2018-05-23 ENCOUNTER — Ambulatory Visit (INDEPENDENT_AMBULATORY_CARE_PROVIDER_SITE_OTHER): Payer: Medicare Other | Admitting: Gastroenterology

## 2018-05-23 ENCOUNTER — Other Ambulatory Visit
Admission: RE | Admit: 2018-05-23 | Discharge: 2018-05-23 | Disposition: A | Discharge status: Home / Self Care | Payer: Medicare Other | Source: Ambulatory Visit | Attending: Gastroenterology | Admitting: Gastroenterology

## 2018-05-23 VITALS — BP 109/60 | HR 76 | Resp 17 | Ht 60.0 in | Wt 137.6 lb

## 2018-05-23 DIAGNOSIS — K5080 Crohn's disease of both small and large intestine without complications: Secondary | ICD-10-CM | POA: Diagnosis not present

## 2018-05-23 DIAGNOSIS — K5000 Crohn's disease of small intestine without complications: Secondary | ICD-10-CM

## 2018-05-23 LAB — COMPREHENSIVE METABOLIC PANEL
ALT: 16 U/L (ref 0–44)
AST: 18 U/L (ref 15–41)
Albumin: 4.3 g/dL (ref 3.5–5.0)
Alkaline Phosphatase: 75 U/L (ref 38–126)
Anion gap: 7 (ref 5–15)
BUN: 21 mg/dL (ref 8–23)
CO2: 29 mmol/L (ref 22–32)
Calcium: 10.2 mg/dL (ref 8.9–10.3)
Chloride: 103 mmol/L (ref 98–111)
Creatinine, Ser: 0.84 mg/dL (ref 0.44–1.00)
GFR calc Af Amer: >60 mL/min (ref >60)
GFR calc non Af Amer: >60 mL/min (ref >60)
Glucose, Bld: 93 mg/dL (ref 70–99)
Potassium: 3.8 mmol/L (ref 3.5–5.1)
Sodium: 139 mmol/L (ref 135–145)
Total Bilirubin: 0.8 mg/dL (ref 0.3–1.2)
Total Protein: 7.8 g/dL (ref 6.5–8.1)

## 2018-05-23 LAB — CBC
HCT: 37.1 % (ref 35.0–47.0)
Hemoglobin: 12.7 g/dL (ref 12.0–16.0)
MCH: 29.9 pg (ref 26.0–34.0)
MCHC: 34.1 g/dL (ref 32.0–36.0)
MCV: 87.5 fL (ref 80.0–100.0)
Platelets: 221 10*3/uL (ref 150–440)
RBC: 4.24 MIL/uL (ref 3.80–5.20)
RDW: 14.5 % (ref 11.5–14.5)
WBC: 5.5 10*3/uL (ref 3.6–11.0)

## 2018-05-23 LAB — C-REACTIVE PROTEIN: CRP: 1 mg/dL — ABNORMAL HIGH (ref <1.0)

## 2018-05-23 NOTE — Progress Notes (Signed)
[name], MD [address]  [place]  [place], [place] [address]  Main: [phone]  Fax: [phone]    Gastroenterology Consultation  Referring Provider:     Hubbard Hartshorn, FNP Primary Care Physician:  Hubbard Hartshorn, FNP Primary Gastroenterologist:  Dr. [name] Reason for Consultation: Crohn's Disease        HPI:   Carolyn Campbell is a 62 y.o. African-American female referred by Dr. Uvaldo Rising, Astrid Divine, FNP  for consultation & management of Crohn's disease. This was diagnosed in 2015, currently on Imuran 50 mg daily. She reports intermittent flareups of her Crohn's disease such as abdominal pain, bloating, rectal bleeding. She received prednisone for flare up. She had 2 flareups within last 1 year. She recently is suffering with constipation, had to take laxatives to have a BM. Her last BM was 3 days ago. She does report some upper abdominal bloating. She denies any weight loss, rectal bleeding, diarrhea, nausea, vomiting. She is not on any prednisone currently. She denies taking NSAIDs.  Follow up 06/21/2017: She underwent EGD and colonoscopy. EGD was normal, colonoscopy revealed normal mucosa but the biopsies showed mild active chronic ileitis and right-sided colitis. No granulomas were seen Doing well overall. Having BM every other day, denies feeling constipated. She does have mild abdominal discomfort after a BM which lasts for about a minute. She continues to take Imuran 50 mg daily. She is trying to lose weight  Follow-up visit 01/17/2018 Patient had an episode of flare up in 09/2017 when she developed right lower quadrant discomfort associated with constipation, pain persisted after relief of constipation which lasted for about 2 weeks. She did not call my office and on the time. She did not take any prednisone, no ER visits. She is currently on Lialda 1.2 g twice daily. She denies having ongoing symptoms. She reports having regular bowel movements and her weight  has been stable. Her most recent blood work revealed mild normocytic anemia.  Follow up visit 02/16/2018 She denies any complaints today. She is currently on Lialda 1.2 g 4 pills daily. She was briefly on 6 pills per day when she had worsened symptoms about 3 weeks ago. Her CRP is improving. Her iron levels are normal, however she is mildly anemic. She is currently taking oral iron daily. She now has insurance from the [place] and requests future prescriptions to be sent to the [facility] so that she doesn't need to pay any co-pay.  Follow-up visit 1919 She reports right-sided headache radiating to the right ER and right sided chest. Also, feels like a knot in the epigastric region. She denies any diarrhea or abdominal pain. She is tolerating Lialda 4.8 g daily. Her weight is stable  Crohn's disease classification:  Age: > 40 Location: ileocolonic  Behavior: non stricturing, non penetrating  Perianal: no  IBD diagnosis: 11/2013  Disease course:Crohn's disease in 2015, initial symptoms were abdominal pain, blood in stool and on wiping, bloating. She had a colonoscopy, VCE, EGD at the time of diagnosis. She was initially on mesalamine 4 pills daily, prednisone later switched to Remicade in 11/2014. She took only 5 doses as she could not afford. Then she was switched to Imuran 50 mg daily. She had mild flareups about twice a year. Colonoscopy 05/2017 revealed mild ileocolonic disease primarily on histology. She self discontinued Imuran. Lialda started in 06/2017  Extra intestinal manifestations: None  IBD surgical history: None No family history of IBD Imaging:  MRE  none CTE none SBFT none  Procedures:  Colonoscopy : 01/28/2005, showed external hemorrhoids only Colonoscopy 12/10/2013, showed normal terminal ileum, biopsies performed. Skipped areas of nonbleeding ulcerative mucosa were seen in the entire colon, biopsies performed. 4 mm polyp in the sigmoid colon Pathology: Rectal biopsy  mild-to-moderate active proctitis with architectural features of chronicity, negative for dysplasia and malignancy. Terminal ileum: Small bowel mucosa with preserved villous architecture. Right colon biopsy: No significant pathologic changes  Upper Endoscopy 12/10/2013, underwent dilation of the esophagus Ampullary biopsy: Normal, gastric biopsy: Intramucosal with erosion and mild chronic gastritis, negative for H. pylori, dysplasia and malignancy  VCE 05/06/2014: Erosions in the distal ileum  EGD 06/08/2017 Normal esophagus, small hiatal hernia, normal stomach and duodenum  Colonoscopy 06/08/2017 The perianal and digital rectal examinations were normal. Pertinent negatives include normal sphincter tone and no palpable rectal Lesions. The terminal ileum appeared normal. Biopsies were taken with a cold forceps for histology. Two scattered non-bleeding aphthae were found in the ascending colon. Rest of the colon and rectum appeared normal. Random biopsies performed   IBD medications:  Steroids: Prednisone, budesonide 5-ASA: mesalamine, Lialda Immunomodulators: stopped azathioprine  TPMT status unknown Biologics:  Anti TNFs: Remicade monotherapy, received induction followed by 1 maintenance dose in 11/2014 Anti Integrins: Ustekinumab: Tofactinib: Clinical trial:   Past Medical History:  Diagnosis Date  . Allergic rhinitis   . Anginal pain ([place])    states MD said it was GERD  . Arthritis   . Asthma   . Breast cancer ([place]) 2006   LT LUMPECTOMY  . Cognitive deficits as late effect of cerebrovascular disease   . COPD (chronic obstructive pulmonary disease) ([place])   . Crohn's disease of both small and large intestine with rectal bleeding ([place]) 12/04/2014  . 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 Dyspnea   . Esophageal reflux   . Glaucoma    vitreous degeneration  . History of kidney stones   . Hyperlipidemia   . Hypertension   . Hypothyroidism   . Occlusion,  cerebral artery    NOS w/infarction  . Osteoporosis   . Personal history of radiation therapy 2006   BREAST CA  . Stroke [facility])     Past Surgical History:  Procedure Laterality Date  . BREAST BIOPSY Left 2006   POS  . BREAST LUMPECTOMY Left 09/20/2004   positive  . BREAST SURGERY Left    malignant biopsy  . CARDIAC CATHETERIZATION    . COLONOSCOPY  2015  . COLONOSCOPY WITH PROPOFOL N/A 06/08/2017   Procedure: COLONOSCOPY WITH PROPOFOL;  Surgeon: Lin Landsman, MD;  Location: [facility];  Service: Gastroenterology;  Laterality: N/A;  . ESOPHAGOGASTRODUODENOSCOPY  2015  . ESOPHAGOGASTRODUODENOSCOPY (EGD) WITH PROPOFOL N/A 06/08/2017   Procedure: ESOPHAGOGASTRODUODENOSCOPY (EGD) WITH PROPOFOL;  Surgeon: Lin Landsman, MD;  Location: [facility];  Service: Gastroenterology;  Laterality: N/A;  . FINGER SURGERY     Right small finger  . [place] STUDY  2015  . TUBAL LIGATION       Current Outpatient Medications:  .  alendronate (FOSAMAX) 70 MG tablet, TAKE 1 TABLET BY MOUTH ONCE A WEEK. TAKE WITH A FULL  GLASS OF WATER ON AN EMPTY  STOMACH., Disp: 12 tablet, Rfl: 3 .  atorvastatin (LIPITOR) 40 MG tablet, Take 1 tablet (40 mg total) by mouth daily., Disp: 90 tablet, Rfl: 0 .  benazepril (LOTENSIN) 20 MG tablet, Take 1 tablet (20 mg total) by mouth daily., Disp: 90 tablet,  Rfl: 0 .  diclofenac sodium (VOLTAREN) 1 % GEL, Apply 2 g topically 4 (four) times daily., Disp: 100 g, Rfl: 0 .  gabapentin (NEURONTIN) 300 MG capsule, Take 1 capsule (300 mg total) by mouth at bedtime., Disp: 90 capsule, Rfl: 0 .  hydrochlorothiazide (HYDRODIURIL) 12.5 MG tablet, Take 1 tablet (12.5 mg total) by mouth daily., Disp: 90 tablet, Rfl: 0 .  latanoprost (XALATAN) 0.005 % ophthalmic solution, Place 1 drop into both eyes at bedtime. , Disp: , Rfl:  .  levothyroxine (SYNTHROID, LEVOTHROID) 50 MCG tablet, Take 1 tablet (50 mcg total) by mouth daily., Disp: 90 tablet, Rfl: 0 .   mesalamine (LIALDA) 1.2 g EC tablet, Take 1 tablet (1.2 g total) by mouth QID., Disp: 360 tablet, Rfl: 0 .  Multiple Vitamin (MULTIVITAMIN) tablet, Take 1 tablet by mouth daily., Disp: , Rfl:  .  sertraline (ZOLOFT) 50 MG tablet, Take 1 tablet (50 mg total) by mouth daily., Disp: 90 tablet, Rfl: 0 .  valACYclovir (VALTREX) 1000 MG tablet, Take 1 tablet (1,000 mg total) by mouth 2 (two) times daily., Disp: 30 tablet, Rfl: 5   Family History  Problem Relation Age of Onset  . Heart disease Mother   . Heart attack Mother   . Breast cancer Sister   . Alcohol abuse Father   . Cerebrovascular Accident Father   . Hypertension Unknown   . Diabetes Unknown   . Breast cancer Sister 88  . Heart attack Other 49     Social History   Tobacco Use  . Smoking status: Never Smoker  . Smokeless tobacco: Never Used  Substance Use Topics  . Alcohol use: No    Alcohol/week: 0.0 standard drinks  . Drug use: No    Allergies as of 05/23/2018 - Review Complete 05/23/2018  Allergen Reaction Noted  . Other Palpitations 11/07/2012    Review of Systems:    All systems reviewed and negative except where noted in HPI.   Physical Exam:  BP 109/60 (BP Location: Left Arm, Patient Position: Sitting, Cuff Size: Normal)   Pulse 76   Resp 17   Ht 5' (1.524 m)   Wt 137 lb 9.6 oz (62.4 kg)   BMI 26.87 kg/m  No LMP recorded. Patient is postmenopausal.  General:   Alert,  Well-developed, well-nourished, pleasant and cooperative in NAD Head:  Normocephalic and atraumatic. Eyes:  Sclera clear, no icterus.   Conjunctiva pink. Ears:  Normal auditory acuity. Nose:  No deformity, discharge, or lesions. Mouth:  No deformity or lesions,oropharynx pink & moist. Neck:  Supple; no masses or thyromegaly. Lungs:  Respirations even and unlabored.  Clear throughout to auscultation.   No wheezes, crackles, or rhonchi. No acute distress. Heart:  Regular rate and rhythm; no murmurs, clicks, rubs, or gallops. Abdomen:   Normal bowel sounds.  No bruits.  Soft, nontender and non-distended without masses, hepatosplenomegaly or hernias noted.  No guarding or rebound tenderness.   Rectal: Nor performed Msk:  Symmetrical without gross deformities. Good, equal movement & strength bilaterally. Pulses:  Normal pulses noted. Extremities:  No clubbing or edema.  No cyanosis. Neurologic:  Alert and oriented x3;  grossly normal neurologically. Skin:  Intact without significant lesions or rashes. No jaundice. Psych:  Alert and cooperative. Normal mood and affect.  Imaging Studies: MRI reviewed  Assessment and Plan:   HELEM REESOR is a 62 y.o. African-American female with Terminal ileal and colonic Crohn's, diagnosed in 2015 who was previously maintained on Imuran 50  mg daily with mild intermittent flareups about twice a year needing prednisone. She does not have any endoscopy evidence of ileocolonic Crohn's disease other than a few scattered aphthae in the ascending colon on colonoscopy in 05/2017 but histology revealed chronic mild active ileitis and right-sided colitis. MR enterography did not inflammation in small bowel. I tried to give her budesonide but she could not afford high co-pay of $500 for 30 days supply. Currently, she is on on Lialda 2.4 g twice a day and she is in clinical remission. She stopped Imuran by herself.  continue Lialda 4.8 g daily  Recheck labs today  IBD Health Maintenance  1.TB status: Gold quantiferon negative on 12/06/2014 2. Anemia: mild normocytic anemia, vitamin B12 and ferritin levels are normal 3.Immunizations: Hep A and B not immune,received 1 dose in 06/2017, Influenza vaccine received in 06/2017, prevnar  Received in 06/2017, pneumovax not received, recommend 4.Cancer screening I) Colon cancer/dysplasia surveillance: Colonoscopy 05/2017, no evidence of dysplasia and no polyps identified. Repeat colonoscopy in 5 years or sooner if she develops any flare up of symptoms of Crohn's  disease II) Cervical cancer: n/a III) Skin cancer - n/a  5.Bone health Vitamin D status: Normal Bone density testing: Not done 5. Labs: today 6. Smoking: Never smoked 7. NSAIDs and Antibiotics use: Denies NSAID use and antibiotic use Advised her to discuss about unilateral headaches with her primary care doctor  Follow up in 4 months   [name], MD

## 2018-06-12 ENCOUNTER — Ambulatory Visit: Payer: Medicare Other

## 2018-06-23 ENCOUNTER — Ambulatory Visit (INDEPENDENT_AMBULATORY_CARE_PROVIDER_SITE_OTHER): Payer: Medicare Other | Admitting: Emergency Medicine

## 2018-06-23 DIAGNOSIS — Z23 Encounter for immunization: Secondary | ICD-10-CM | POA: Diagnosis not present

## 2018-06-23 NOTE — Progress Notes (Signed)
Oct

## 2018-06-26 ENCOUNTER — Ambulatory Visit: Payer: Medicare Other

## 2018-07-05 ENCOUNTER — Other Ambulatory Visit: Payer: Self-pay

## 2018-07-05 NOTE — Patient Outreach (Signed)
[facility]) Care Management  07/05/2018  Carolyn Campbell Feb 26, 1956 [id number]   Medication Adherence call to Carolyn Campbell spoke with patient she is now receiving it thru a different  mail order company she is not getting it thru [facility] instead of [facility].patient is showing due on Benazepril 40 mg.Carolyn Campbell is showing past due under Carolyn Campbell.   [facility] Management Direct Dial [phone]  Fax [phone] [email or web address] [email or web address]

## 2018-07-12 ENCOUNTER — Ambulatory Visit: Payer: Medicare Other | Admitting: Family Medicine

## 2018-07-14 DIAGNOSIS — H2513 Age-related nuclear cataract, bilateral: Secondary | ICD-10-CM | POA: Diagnosis not present

## 2018-07-14 DIAGNOSIS — H401132 Primary open-angle glaucoma, bilateral, moderate stage: Secondary | ICD-10-CM | POA: Diagnosis not present

## 2018-07-18 ENCOUNTER — Other Ambulatory Visit: Payer: Self-pay | Admitting: Family Medicine

## 2018-07-18 DIAGNOSIS — Z1231 Encounter for screening mammogram for malignant neoplasm of breast: Secondary | ICD-10-CM

## 2018-07-19 ENCOUNTER — Ambulatory Visit (INDEPENDENT_AMBULATORY_CARE_PROVIDER_SITE_OTHER): Payer: Medicare Other | Admitting: Family Medicine

## 2018-07-19 ENCOUNTER — Encounter: Payer: Self-pay | Admitting: Family Medicine

## 2018-07-19 VITALS — BP 108/60 | HR 81 | Temp 98.0°F | Resp 12 | Ht 60.0 in | Wt 140.6 lb

## 2018-07-19 DIAGNOSIS — I10 Essential (primary) hypertension: Secondary | ICD-10-CM

## 2018-07-19 DIAGNOSIS — K219 Gastro-esophageal reflux disease without esophagitis: Secondary | ICD-10-CM

## 2018-07-19 DIAGNOSIS — D899 Disorder involving the immune mechanism, unspecified: Secondary | ICD-10-CM

## 2018-07-19 DIAGNOSIS — K50811 Crohn's disease of both small and large intestine with rectal bleeding: Secondary | ICD-10-CM | POA: Diagnosis not present

## 2018-07-19 DIAGNOSIS — I83813 Varicose veins of bilateral lower extremities with pain: Secondary | ICD-10-CM

## 2018-07-19 DIAGNOSIS — M8580 Other specified disorders of bone density and structure, unspecified site: Secondary | ICD-10-CM

## 2018-07-19 DIAGNOSIS — E039 Hypothyroidism, unspecified: Secondary | ICD-10-CM

## 2018-07-19 DIAGNOSIS — I69359 Hemiplegia and hemiparesis following cerebral infarction affecting unspecified side: Secondary | ICD-10-CM | POA: Diagnosis not present

## 2018-07-19 DIAGNOSIS — E7800 Pure hypercholesterolemia, unspecified: Secondary | ICD-10-CM | POA: Diagnosis not present

## 2018-07-19 DIAGNOSIS — B001 Herpesviral vesicular dermatitis: Secondary | ICD-10-CM | POA: Insufficient documentation

## 2018-07-19 DIAGNOSIS — E559 Vitamin D deficiency, unspecified: Secondary | ICD-10-CM

## 2018-07-19 DIAGNOSIS — I69391 Dysphagia following cerebral infarction: Secondary | ICD-10-CM

## 2018-07-19 DIAGNOSIS — Z853 Personal history of malignant neoplasm of breast: Secondary | ICD-10-CM

## 2018-07-19 DIAGNOSIS — I700 Atherosclerosis of aorta: Secondary | ICD-10-CM

## 2018-07-19 DIAGNOSIS — D849 Immunodeficiency, unspecified: Secondary | ICD-10-CM

## 2018-07-19 DIAGNOSIS — M19041 Primary osteoarthritis, right hand: Secondary | ICD-10-CM

## 2018-07-19 DIAGNOSIS — J069 Acute upper respiratory infection, unspecified: Secondary | ICD-10-CM | POA: Diagnosis not present

## 2018-07-19 DIAGNOSIS — F325 Major depressive disorder, single episode, in full remission: Secondary | ICD-10-CM

## 2018-07-19 HISTORY — DX: Herpesviral vesicular dermatitis: B00.1

## 2018-07-19 LAB — LIPID PANEL
Cholesterol: 191 mg/dL (ref <200)
HDL: 86 mg/dL (ref >50)
LDL Cholesterol (Calc): 90 mg/dL (calc)
Non-HDL Cholesterol (Calc): 105 mg/dL (calc) (ref <130)
Total CHOL/HDL Ratio: 2.2 (calc) (ref <5.0)
Triglycerides: 65 mg/dL (ref <150)

## 2018-07-19 LAB — TSH: TSH: 2.34 m[IU]/L (ref 0.40–4.50)

## 2018-07-19 MED ORDER — BENAZEPRIL HCL 10 MG PO TABS: 10.0000 mg | ORAL_TABLET | Freq: Every day | ORAL | 0 refills | Status: DC

## 2018-07-19 MED ORDER — SERTRALINE HCL 50 MG PO TABS: 25.0000 mg | ORAL_TABLET | Freq: Every day | ORAL | 0 refills | Status: DC

## 2018-07-19 NOTE — Progress Notes (Signed)
Name: Carolyn Campbell   MRN: [id number]    DOB: 09-29-1955   Date:07/19/2018       Progress Note  Subjective  Chief Complaint  Chief Complaint  Patient presents with  . Follow-up  . URI    left side of throat and left ear pain oset 2 days  . Leg Pain    onset 2 days upper thigh with swelling    HPI  HTN: she is again low end of normal today, denies lightheadedness/orthostatic lightheadedness.  She does not want to stop HCTZ due to occasional BLE edema.  We will decrease benazepril to 39m daily and keep 12.534mHCTZ.  She denies chest pain, shortness of breath, headaches, vision changes (does note that she has glaucoma). BP's at home have been 100's/60's.  URI: Sore throat, LEFT ear pain for a few days now, she is taking allergy medication OTC and this is helping a little bit.  No nasal congestion, chest pain, shortness of breath, N/V, abdominal pain.  LEFT Leg Pain: Has been ongoing for about a week, she notes that voltaren gel seems to help along with rest. She rides the bike at the gym several days a week and this seems to exacerbate her pain. She notes her husband said her leg looked a little swollen, but she has not appreciated this herself. She denies calf tenderness or redness. She also notes varicose veins in bilateral upper thighs that are starting to become more prominent and painful.  Immunosuppression secondary to Chron's disease and medication use: Follows up with Dr. VaMarius Campbell appointment was on 05/23/18.  Patient taking Lialda BID- got payment assistance and is still taking this medication; she is off the Imuran. Patient denies pain with BM or  BRB when wiping; no abdominal pain, no vomiting.   Hypothyroidism: Synthroid 5086mhas been on stable dose for awhile, denies weight changes, bowel changes, skin changes. No history of thyroid nodules or enlarged thyroid.  Atherosclerosis of aorta: Taking lipitor every night, no SE.   S/p CVA with left side weakness: States left  side weakness is same, did strip and fall from not picking up her LEFT leg several months ago.  She endorses ongoing intermittent difficulty saying/choosing the correct words.  On statin & BP meds; lipids and BP well controlled. Las lipid was at goal - we will recheck today  Hyperlipidemia Stable-taking statin and denies myalgia, chest pain or SOB, we will recheck labs. Patient ate ham sandwich  RecentLabs       Lab Results  Component Value Date   CHOL 200 (H) 06/22/2017   HDL 88 06/22/2017   LDLCALC 95 06/22/2017   TRIG 79 06/22/2017   CHOLHDL 2.3 06/22/2017     Hx breast cancer: Has mammogram scheduled 08/08/2018.  Is doing mammograms once yearly, did do radiation in 2006.  She is feeling well now, no concerning areas.   Chronic back pain with radiculitis: Has not been having back pain in quite some time - well controlled with gabapentin. Takes gabapentin every night.   Arthritis of right hand: states worse when she uses it a lot; patient states typically just deals with the pain sometimes does hand stretching for relief. Has voltaren gel, but has not been using it for this.   GERD Diet controlled- does not take protonix anymore; sees GI, avoids dietary triggers  Fever blisters: States takes valtrex as needed, for fever blisters around mouth, no recent outbreaks.   Osteopenia - taking Fossamax weekly. Last bone  density was Sept 2018 - due in 2020 for repeat  Depression/ Anxiety  Takes zoloft every day- states it is well controlled on this dose has probably been on it for 15 years, she would like to decrease and eventually come off of the medication if she can. States has had depression due to husbands health. Denies si/hi.  We will decrease to 41m until next visit, then wean off if tolerating well.   [facility] Total Score  0     Vitamin D Deficiency Not taking supplementation - discussed adding OTC back to  her regimen. Endorses chronic fatigue- states has improved with being more active.   Patient Active Problem List   Diagnosis Date Noted  . Arthritis of right hand 12/21/2017  . Immunosuppressed status ([place] 12/21/2017  . Major depression in remission ([place] 12/21/2017  . Fibrocystic breast changes 06/22/2017  . Dysphagia as late effect of stroke 03/20/2017  . Osteopenia 10/18/2016  . Atherosclerosis of aorta ([place] 12/19/2015  . Vitreous degeneration 05/21/2015  . Bad memory 05/21/2015  . Chronic ulcerative proctitis ([facility] 05/21/2015  . Glaucoma associated with chamber angle anomalies 05/21/2015  . Dermatitis, eczematoid 05/21/2015  . CD (Crohn's disease) ([place] 05/21/2015  . H/O malignant neoplasm of breast 05/21/2015  . Crohn's disease of both small and large intestine with rectal bleeding ([place] 12/04/2014  . Solitary pulmonary nodule 11/07/2012  . 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 Cognitive deficits as late effect of cerebrovascular disease 01/20/2010  . CVA, old, hemiparesis ([place] 04/28/2009  . Adult hypothyroidism 06/12/2008  . Acid reflux 05/23/2007  . Hypercholesterolemia without hypertriglyceridemia 01/18/2007  . Benign essential HTN 01/18/2007    Past Surgical History:  Procedure Laterality Date  . BREAST BIOPSY Left 2006   POS  . BREAST LUMPECTOMY Left 09/20/2004   positive  . BREAST SURGERY Left    malignant biopsy  . CARDIAC CATHETERIZATION    . COLONOSCOPY  2015  . COLONOSCOPY WITH PROPOFOL N/A 06/08/2017   Procedure: COLONOSCOPY WITH PROPOFOL;  Surgeon: VLin Landsman MD;  [facility]  Service: Gastroenterology;  Laterality: N/A;  . ESOPHAGOGASTRODUODENOSCOPY  2015  . ESOPHAGOGASTRODUODENOSCOPY (EGD) WITH PROPOFOL N/A 06/08/2017   Procedure: ESOPHAGOGASTRODUODENOSCOPY (EGD) WITH PROPOFOL;  Surgeon: VLin Landsman MD;  [facility]  Service: Gastroenterology;  Laterality: N/A;  . FINGER  SURGERY     Right small finger  . [place]  2015  . TUBAL LIGATION      Family History  Problem Relation Age of Onset  . Heart disease Mother   . Heart attack Mother   . Breast cancer Sister   . Alcohol abuse Father   . Cerebrovascular Accident Father   . Hypertension Unknown   . Diabetes Unknown   . Breast cancer Sister 676 . Heart attack Other 465   Social History   Socioeconomic History  . Marital status: Married    Spouse name: Not on file  . Number of children: Not on file  . Years of education: Not on file  . Highest education level: Not on file  Occupational History  . Not on file  Social Needs  . Financial resource strain: Not on file  . Food insecurity:    Worry: Not on file    Inability: Not on file  . Transportation needs:    Medical: Not on file    Non-medical: Not on file  Tobacco Use  . Smoking status: Never Smoker  . Smokeless tobacco: Never Used  Substance and Sexual Activity  . Alcohol use: No    Alcohol/week: 0.0 standard drinks  . Drug use: No  . Sexual activity: Yes    Partners: Male  Lifestyle  . Physical activity:    Days per week: Not on file    Minutes per session: Not on file  . Stress: Not on file  Relationships  . Social connections:    Talks on phone: Not on file    Gets together: Not on file    Attends religious service: Not on file    Active member of club or organization: Not on file    Attends meetings of clubs or organizations: Not on file    Relationship status: Not on file  . Intimate partner violence:    Fear of current or ex partner: Not on file    Emotionally abused: Not on file    Physically abused: Not on file    Forced sexual activity: Not on file  Other Topics Concern  . Not on file  Social History Narrative   Married. One son and one daughter. She is disabled after she had breast cancer and strokes. 2 caffeinated beverages daily.     Current Outpatient Medications:  .  alendronate (FOSAMAX)  70 MG tablet, TAKE 1 TABLET BY MOUTH ONCE A WEEK. TAKE WITH A FULL  GLASS OF WATER ON AN EMPTY  STOMACH., Disp: 12 tablet, Rfl: 3 .  atorvastatin (LIPITOR) 40 MG tablet, Take 1 tablet (40 mg total) by mouth daily., Disp: 90 tablet, Rfl: 0 .  benazepril (LOTENSIN) 20 MG tablet, Take 1 tablet (20 mg total) by mouth daily., Disp: 90 tablet, Rfl: 0 .  diclofenac sodium (VOLTAREN) 1 % GEL, Apply 2 g topically 4 (four) times daily., Disp: 100 g, Rfl: 0 .  gabapentin (NEURONTIN) 300 MG capsule, Take 1 capsule (300 mg total) by mouth at bedtime., Disp: 90 capsule, Rfl: 0 .  hydrochlorothiazide (HYDRODIURIL) 12.5 MG tablet, Take 1 tablet (12.5 mg total) by mouth daily., Disp: 90 tablet, Rfl: 0 .  latanoprost (XALATAN) 0.005 % ophthalmic solution, Place 1 drop into both eyes at bedtime. , Disp: , Rfl:  .  levothyroxine (SYNTHROID, LEVOTHROID) 50 MCG tablet, Take 1 tablet (50 mcg total) by mouth daily., Disp: 90 tablet, Rfl: 0 .  mesalamine (LIALDA) 1.2 g EC tablet, Take 1 tablet (1.2 g total) by mouth QID., Disp: 360 tablet, Rfl: 0 .  Multiple Vitamin (MULTIVITAMIN) tablet, Take 1 tablet by mouth daily., Disp: , Rfl:  .  sertraline (ZOLOFT) 50 MG tablet, Take 1 tablet (50 mg total) by mouth daily., Disp: 90 tablet, Rfl: 0 .  valACYclovir (VALTREX) 1000 MG tablet, Take 1 tablet (1,000 mg total) by mouth 2 (two) times daily., Disp: 30 tablet, Rfl: 5  Allergies  Allergen Reactions  . Other Palpitations    I personally reviewed active problem list, medication list, allergies, family history, social history, health maintenance, notes from last encounter, lab results with the patient/caregiver today.   ROS Ten systems reviewed and is negative except as mentioned in HPI  Objective  Vitals:   07/19/18 0931  BP: 108/60  Pulse: 81  Resp: 12  Temp: 98 F (36.7 C)  TempSrc: Oral  SpO2: 99%  Weight: 140 lb 9.6 oz (63.8 kg)  Height: 5' (1.524 m)    Body mass index is 27.46 kg/m.  Physical  Exam Constitutional: Patient appears  well-developed and well-nourished. No distress.  HENT: Head: Normocephalic and atraumatic. Eyes: Conjunctivae and EOM are normal. No scleral icterus.  Pupils are equal, round, and reactive to light.  Neck: Normal range of motion. Neck supple. No JVD present.  Cardiovascular: Normal rate, regular rhythm and normal heart sounds.  No murmur heard. No BLE edema. Pulmonary/Chest: Effort normal and breath sounds normal. No respiratory distress. Musculoskeletal: Normal range of motion, no joint effusions. No gross deformities, no calf tenderness, swelling, or erythema today Neurological: Pt is alert and oriented to person, place, and time. No cranial nerve deficit. Coordination, balance, strength, speech and gait are normal.  Skin: Skin is warm and dry. No rash noted. No erythema. Thin superficial varicose veins are present - non-tender, non-erythematous  Psychiatric: Patient has a normal mood and affect. behavior is normal. Judgment and thought content normal.  No results found for this or any previous visit (from the past 72 hour(s)).  PHQ2/9: Depression screen [place] 2/9 07/19/2018 05/12/2018 04/11/2018 03/28/2018 12/21/2017  Decreased Interest 0 0 0 0 0  Down, Depressed, Hopeless 0 0 0 0 0  PHQ - 2 Score 0 0 0 0 0  Altered sleeping 0 0 0 1 1  Tired, decreased energy 0 0 0 1 2  Change in appetite 0 0 0 0 0  Feeling bad or failure about yourself  0 0 0 0 0  Trouble concentrating 0 0 0 0 0  Moving slowly or fidgety/restless 0 0 0 0 -  Suicidal thoughts 0 0 0 0 0  PHQ-9 Score 0 0 0 2 3  Difficult doing work/chores Not difficult at all Not difficult at all Not difficult at all Not difficult at all -    Fall Risk: Fall Risk  07/19/2018 05/12/2018 04/11/2018 03/28/2018 06/22/2017  Falls in the past year? Yes Yes Yes No No  Number falls in past yr: 1 1 1  - -  Injury with Fall? No No No - -   Functional Status Survey: Is the patient deaf or have difficulty hearing?:  No Does the patient have difficulty seeing, even when wearing glasses/contacts?: Yes Does the patient have difficulty concentrating, remembering, or making decisions?: No Does the patient have difficulty walking or climbing stairs?: No Does the patient have difficulty dressing or bathing?: No Does the patient have difficulty doing errands alone such as visiting a doctor's office or shopping?: No  Assessment & Plan  1. Benign essential HTN - Decrease to 92m lotensin, may consider 560mor d/c of medication in future. Continue HCTZ for periodic BLE edema. - benazepril (LOTENSIN) 10 MG tablet; Take 1 tablet (10 mg total) by mouth daily.  Dispense: 90 tablet; Refill: 0  2. Upper respiratory tract infection, unspecified type - viral in nature, taking OTC antihistamine, return if not improving in 7 days  3. Varicose veins of both lower extremities with pain - Appears superficial, she would like to discuss removal for cosmetic reasons; also referring to vein and vascular for possible claudication in BLE based on symptoms of BLE pain with exertion, better at rest. - Ambulatory referral to Vascular Surgery  4. Crohn's disease of both small and large intestine with rectal bleeding ([place]- Keep follow up w/ GI  5. Immunosuppressed status ([place]- Keep follow up with GI  6. Adult hypothyroidism - 5024mSynthroid - TSH  7. Atherosclerosis of aorta ([facility]) - Lipid panel  8. Dysphagia as late effect of stroke - Stable, no changes  9. CVA, old, hemiparesis ([place]  Stable, no changes. - Lipid panel  10. Hypercholesterolemia without hypertriglyceridemia - Lipid panel  11. H/O malignant neoplasm of breast - Mammogram is ordered.  12. Arthritis of right hand - Voltaren gel PRN  13. Gastroesophageal reflux disease, esophagitis presence not specified - Keep follow up w/ GI  14. Fever blister Stable on Valtrex  15. Osteopenia, unspecified location Continue Fossamax, GERD is well  controlled with diet only.  16. Major depression in remission ([place]) - Has been well controlled, we will taper down as she would like to come off of Zoloft. - sertraline (ZOLOFT) 50 MG tablet; Take 0.5 tablets (25 mg total) by mouth daily.  Dispense: 90 tablet; Refill: 0  17. Vitamin D deficiency - Discussed supplementation.

## 2018-07-19 NOTE — Patient Instructions (Addendum)
Take 10mg  of Benazepril at night: You currently have 20mg  tablets - so take 1/2 tablet each night until you run out.  Then switch to taking 1-10mg  tablet each night. Continue 12.5mg  HCTZ each morning   Take 1/2 tablet of Zoloft daily

## 2018-07-25 ENCOUNTER — Encounter (INDEPENDENT_AMBULATORY_CARE_PROVIDER_SITE_OTHER): Payer: Self-pay | Admitting: Vascular Surgery

## 2018-07-25 ENCOUNTER — Ambulatory Visit (INDEPENDENT_AMBULATORY_CARE_PROVIDER_SITE_OTHER): Payer: Medicare Other | Admitting: Vascular Surgery

## 2018-07-25 VITALS — BP 104/60 | HR 83 | Resp 16 | Ht 60.0 in | Wt 139.4 lb

## 2018-07-25 DIAGNOSIS — I700 Atherosclerosis of aorta: Secondary | ICD-10-CM

## 2018-07-25 DIAGNOSIS — I83813 Varicose veins of bilateral lower extremities with pain: Secondary | ICD-10-CM | POA: Diagnosis not present

## 2018-07-25 DIAGNOSIS — E7800 Pure hypercholesterolemia, unspecified: Secondary | ICD-10-CM

## 2018-07-25 NOTE — Progress Notes (Signed)
Subjective:    Patient ID: Carolyn Campbell, female    DOB: 02-Oct-1955, 62 y.o.   MRN: [id number] Chief Complaint  Patient presents with  . New Patient (Initial Visit)    ref Uvaldo Rising for varicose veins   Presents as a new patient referred by Dr. Uvaldo Rising for evaluation of painful "varicose veins".  Patient notes a long-standing history of varicosities located to the bilateral legs.  She notes that her varicosities have increased in number, size and discomfort over the last "few years".  The patient notes that they are itchy and painful and the symptoms become worse towards the end of the day or with sitting and standing for long periods of time.  The patient also experiences edema to the bilateral legs.  This also worsens towards the end of the day.  At this time, the patient does not engage in conservative therapy including wearing medical grade 1 compression socks, elevating her legs or remaining active on a daily basis.  Patient talks about an intermittent pain in her thigh which occurs when she places weight on her left foot.  The patient has not undergone any imaging of her joints or spine to this date.  She denies any rest pain or ulcer formation to the bilateral lower extremity.  Patient denies any recent surgery or trauma to the bilateral legs DVT history or current bouts of cellulitis.  Patient denies any fever, nausea vomiting.  Review of Systems  Constitutional: Negative.   HENT: Negative.   Eyes: Negative.   Respiratory: Negative.   Cardiovascular: Positive for leg swelling.       Painful varicose veins  Gastrointestinal: Negative.   Endocrine: Negative.   Genitourinary: Negative.   Musculoskeletal: Negative.   Skin: Negative.   Allergic/Immunologic: Negative.   Neurological: Negative.   Hematological: Negative.   Psychiatric/Behavioral: Negative.       Objective:   Physical Exam  Constitutional: She is oriented to person, place, and time. She appears well-developed and  well-nourished. No distress.  HENT:  Head: Normocephalic and atraumatic.  Right Ear: External ear normal.  Left Ear: External ear normal.  Eyes: Pupils are equal, round, and reactive to light. Conjunctivae and EOM are normal.  Neck: Normal range of motion.  Cardiovascular: Normal rate, regular rhythm, normal heart sounds and intact distal pulses.  Pulses:      Radial pulses are 2+ on the right side, and 2+ on the left side.       Dorsalis pedis pulses are 1+ on the right side, and 1+ on the left side.       Posterior tibial pulses are 1+ on the right side, and 2+ on the left side.  Pulmonary/Chest: Effort normal and breath sounds normal.  Musculoskeletal: Normal range of motion. She exhibits edema (Mild edema noted to the bilateral ankles).  Neurological: She is alert and oriented to person, place, and time.  Skin: Skin is warm and dry. She is not diaphoretic.  Diffuse less than 1 cm varicosities noted bilaterally.  There is no stasis dermatitis, fibrosis, cellulitis or active ulcerations noted at this time  Psychiatric: She has a normal mood and affect. Her behavior is normal. Judgment and thought content normal.  Vitals reviewed.  BP 104/60 (BP Location: Right Arm)   Pulse 83   Resp 16   Ht 5' (1.524 m)   Wt 139 lb 6.4 oz (63.2 kg)   BMI 27.22 kg/m   Past Medical History:  Diagnosis Date  . Allergic rhinitis   .  Anginal pain ([place])    states MD said it was GERD  . Arthritis   . Asthma   . Breast cancer ([place]) 2006   LT LUMPECTOMY  . Cognitive deficits as late effect of cerebrovascular disease   . COPD (chronic obstructive pulmonary disease) ([place])   . Crohn's disease of both small and large intestine with rectal bleeding ([place]) 12/04/2014  . 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 Dyspnea   . Esophageal reflux   . Glaucoma    vitreous degeneration  . History of kidney stones   . Hyperlipidemia   . Hypertension   . Hypothyroidism   . Occlusion, cerebral  artery    NOS w/infarction  . Osteoporosis   . Personal history of radiation therapy 2006   BREAST CA  . Stroke [facility])    Social History   Socioeconomic History  . Marital status: Married    Spouse name: Not on file  . Number of children: Not on file  . Years of education: Not on file  . Highest education level: Not on file  Occupational History  . Not on file  Social Needs  . Financial resource strain: Not on file  . Food insecurity:    Worry: Not on file    Inability: Not on file  . Transportation needs:    Medical: Not on file    Non-medical: Not on file  Tobacco Use  . Smoking status: Never Smoker  . Smokeless tobacco: Never Used  Substance and Sexual Activity  . Alcohol use: No    Alcohol/week: 0.0 standard drinks  . Drug use: No  . Sexual activity: Yes    Partners: Male  Lifestyle  . Physical activity:    Days per week: Not on file    Minutes per session: Not on file  . Stress: Not on file  Relationships  . Social connections:    Talks on phone: Not on file    Gets together: Not on file    Attends religious service: Not on file    Active member of club or organization: Not on file    Attends meetings of clubs or organizations: Not on file    Relationship status: Not on file  . Intimate partner violence:    Fear of current or ex partner: Not on file    Emotionally abused: Not on file    Physically abused: Not on file    Forced sexual activity: Not on file  Other Topics Concern  . Not on file  Social History Narrative   Married. One son and one daughter. She is disabled after she had breast cancer and strokes. 2 caffeinated beverages daily.   Past Surgical History:  Procedure Laterality Date  . BREAST BIOPSY Left 2006   POS  . BREAST LUMPECTOMY Left 09/20/2004   positive  . BREAST SURGERY Left    malignant biopsy  . CARDIAC CATHETERIZATION    . COLONOSCOPY  2015  . COLONOSCOPY WITH PROPOFOL N/A 06/08/2017   Procedure: COLONOSCOPY WITH PROPOFOL;   Surgeon: Lin Landsman, MD;  Location: [facility];  Service: Gastroenterology;  Laterality: N/A;  . ESOPHAGOGASTRODUODENOSCOPY  2015  . ESOPHAGOGASTRODUODENOSCOPY (EGD) WITH PROPOFOL N/A 06/08/2017   Procedure: ESOPHAGOGASTRODUODENOSCOPY (EGD) WITH PROPOFOL;  Surgeon: Lin Landsman, MD;  Location: [facility];  Service: Gastroenterology;  Laterality: N/A;  . FINGER SURGERY     Right small finger  . [place] STUDY  2015  . TUBAL LIGATION  Family History  Problem Relation Age of Onset  . Heart disease Mother   . Heart attack Mother   . Breast cancer Sister   . Alcohol abuse Father   . Cerebrovascular Accident Father   . Hypertension Unknown   . Diabetes Unknown   . Breast cancer Sister 33  . Heart attack Other 41   Allergies  Allergen Reactions  . Other Palpitations      Assessment & Plan:  Presents as a new patient referred by Dr. Uvaldo Rising for evaluation of painful "varicose veins".  Patient notes a long-standing history of varicosities located to the bilateral legs.  She notes that her varicosities have increased in number, size and discomfort over the last "few years".  The patient notes that they are itchy and painful and the symptoms become worse towards the end of the day or with sitting and standing for long periods of time.  The patient also experiences edema to the bilateral legs.  This also worsens towards the end of the day.  At this time, the patient does not engage in conservative therapy including wearing medical grade 1 compression socks, elevating her legs or remaining active on a daily basis.  Patient talks about an intermittent pain in her thigh which occurs when she places weight on her left foot.  The patient has not undergone any imaging of her joints or spine to this date.  She denies any rest pain or ulcer formation to the bilateral lower extremity.  Patient denies any recent surgery or trauma to the bilateral legs DVT history or  current bouts of cellulitis.  Patient denies any fever, nausea vomiting.  1. Varicose veins of both lower extremities with pain - New The patient was encouraged to wear graduated compression stockings (20-30 mmHg) on a daily basis. The patient was instructed to begin wearing the stockings first thing in the morning and removing them in the evening. The patient was instructed specifically not to sleep in the stockings. Prescription given. In addition, behavioral modification including elevation during the day will be initiated. Anti-inflammatories for pain. I will bring the patient back and have her undergo bilateral venous duplex to rule out any venous versus lymphatic disease The patient will follow up in three months to asses conservative management.  Information on chronic venous insufficiency and compression stockings was given to the patient. The patient was instructed to call the office in the interim if any worsening edema or ulcerations to the legs, feet or toes occurs. The patient expresses their understanding.  - VAS [place] LOWER EXTREMITY VENOUS REFLUX; Future  2. Atherosclerosis of aorta ([place]) - Stable Patient presents with intermittent left thigh pain. This thigh pain occurs when she applies pressure to her left foot She does have multiple risk factors for peripheral artery disease I will bring her back for an ABI to rule out any arterial insufficiency however I did encourage her to follow-up with her primary care physician to undergo x-rays of her joints and spine to rule out any osteoarthritis or degenerative joint disease  - VAS [place] ABI WITH/WO TBI; Future  3. Hypercholesterolemia without hypertriglyceridemia - Stable Encouraged good control as its slows the progression of atherosclerotic disease  Current Outpatient Medications on File Prior to Visit  Medication Sig Dispense Refill  . alendronate (FOSAMAX) 70 MG tablet TAKE 1 TABLET BY MOUTH ONCE A WEEK. TAKE WITH A FULL  GLASS  OF WATER ON AN EMPTY  STOMACH. 12 tablet 3  . atorvastatin (LIPITOR) 40 MG  tablet Take 1 tablet (40 mg total) by mouth daily. 90 tablet 0  . benazepril (LOTENSIN) 10 MG tablet Take 1 tablet (10 mg total) by mouth daily. 90 tablet 0  . diclofenac sodium (VOLTAREN) 1 % GEL Apply 2 g topically 4 (four) times daily. 100 g 0  . gabapentin (NEURONTIN) 300 MG capsule Take 1 capsule (300 mg total) by mouth at bedtime. 90 capsule 0  . hydrochlorothiazide (HYDRODIURIL) 12.5 MG tablet Take 1 tablet (12.5 mg total) by mouth daily. 90 tablet 0  . latanoprost (XALATAN) 0.005 % ophthalmic solution Place 1 drop into both eyes at bedtime.     Marland Kitchen levothyroxine (SYNTHROID, LEVOTHROID) 50 MCG tablet Take 1 tablet (50 mcg total) by mouth daily. 90 tablet 0  . mesalamine (LIALDA) 1.2 g EC tablet Take 1 tablet (1.2 g total) by mouth QID. 360 tablet 0  . Multiple Vitamin (MULTIVITAMIN) tablet Take 1 tablet by mouth daily.    . sertraline (ZOLOFT) 50 MG tablet Take 0.5 tablets (25 mg total) by mouth daily. 90 tablet 0  . valACYclovir (VALTREX) 1000 MG tablet Take 1 tablet (1,000 mg total) by mouth 2 (two) times daily. 30 tablet 5   No current facility-administered medications on file prior to visit.    There are no Patient Instructions on file for this visit. No follow-ups on file.  Josee Speece A Mita Vallo, PA-C

## 2018-08-08 ENCOUNTER — Ambulatory Visit
Admission: RE | Admit: 2018-08-08 | Discharge: 2018-08-08 | Disposition: A | Discharge status: Home / Self Care | Payer: Medicare Other | Source: Ambulatory Visit | Attending: Family Medicine | Admitting: Family Medicine

## 2018-08-08 DIAGNOSIS — Z1231 Encounter for screening mammogram for malignant neoplasm of breast: Secondary | ICD-10-CM

## 2018-08-25 NOTE — Telephone Encounter (Signed)
Erroneous Entry  

## 2018-09-18 ENCOUNTER — Other Ambulatory Visit: Payer: Self-pay | Admitting: Family Medicine

## 2018-09-18 DIAGNOSIS — G8929 Other chronic pain: Secondary | ICD-10-CM

## 2018-09-18 DIAGNOSIS — M5442 Lumbago with sciatica, left side: Secondary | ICD-10-CM

## 2018-09-18 DIAGNOSIS — F325 Major depressive disorder, single episode, in full remission: Secondary | ICD-10-CM

## 2018-09-18 DIAGNOSIS — I700 Atherosclerosis of aorta: Secondary | ICD-10-CM

## 2018-09-18 DIAGNOSIS — M5430 Sciatica, unspecified side: Secondary | ICD-10-CM

## 2018-09-18 DIAGNOSIS — E038 Other specified hypothyroidism: Secondary | ICD-10-CM

## 2018-09-18 DIAGNOSIS — E7800 Pure hypercholesterolemia, unspecified: Secondary | ICD-10-CM

## 2018-09-18 DIAGNOSIS — I10 Essential (primary) hypertension: Secondary | ICD-10-CM

## 2018-09-19 MED ORDER — BENAZEPRIL HCL 10 MG PO TABS: 10.0000 mg | ORAL_TABLET | Freq: Every day | ORAL | 0 refills | Status: DC

## 2018-09-19 MED ORDER — SERTRALINE HCL 50 MG PO TABS: 25.0000 mg | ORAL_TABLET | Freq: Every day | ORAL | 0 refills | Status: DC

## 2018-09-19 MED ORDER — GABAPENTIN 300 MG PO CAPS: 300.0000 mg | ORAL_CAPSULE | Freq: Every day | ORAL | 0 refills | Status: DC

## 2018-09-19 MED ORDER — LEVOTHYROXINE SODIUM 50 MCG PO TABS: 50.0000 ug | ORAL_TABLET | Freq: Every day | ORAL | 0 refills | Status: DC

## 2018-09-19 MED ORDER — ATORVASTATIN CALCIUM 40 MG PO TABS: 40.0000 mg | ORAL_TABLET | Freq: Every day | ORAL | 0 refills | Status: DC

## 2018-09-19 NOTE — Telephone Encounter (Signed)
Refill request for Hypertension medication:  Benazepril 10 mg  Last office visit pertaining to hypertension: 07/19/2018  BP Readings from Last 3 Encounters:  07/25/18 104/60  07/19/18 108/60  05/23/18 109/60    Lab Results  Component Value Date   CREATININE 0.84 05/23/2018   BUN 21 05/23/2018   NA 139 05/23/2018   K 3.8 05/23/2018   CL 103 05/23/2018   CO2 29 05/23/2018   Refill Request for Cholesterol medication. Lipitor 40 mg  Last physical: None indicated  Lab Results  Component Value Date   CHOL 191 07/19/2018   HDL 86 07/19/2018   LDLCALC 90 07/19/2018   TRIG 65 07/19/2018   CHOLHDL 2.2 07/19/2018    Refill request for thyroid medication: Levothyroxine 50 mg  Last Physical: None indicated  Lab Results  Component Value Date   TSH 2.34 07/19/2018    Follow-ups on file. 10/18/2018

## 2018-09-20 ENCOUNTER — Encounter: Payer: Self-pay | Admitting: Family Medicine

## 2018-09-20 DIAGNOSIS — M8580 Other specified disorders of bone density and structure, unspecified site: Secondary | ICD-10-CM

## 2018-09-20 DIAGNOSIS — I10 Essential (primary) hypertension: Secondary | ICD-10-CM

## 2018-09-21 MED ORDER — ALENDRONATE SODIUM 70 MG PO TABS: ORAL_TABLET | ORAL | 3 refills | Status: DC

## 2018-09-21 MED ORDER — HYDROCHLOROTHIAZIDE 12.5 MG PO TABS: 12.5000 mg | ORAL_TABLET | Freq: Every day | ORAL | 1 refills | Status: DC

## 2018-10-18 ENCOUNTER — Other Ambulatory Visit: Payer: Self-pay

## 2018-10-18 ENCOUNTER — Encounter: Payer: Self-pay | Admitting: Family Medicine

## 2018-10-18 ENCOUNTER — Ambulatory Visit (INDEPENDENT_AMBULATORY_CARE_PROVIDER_SITE_OTHER): Payer: Medicare Other | Admitting: Family Medicine

## 2018-10-18 VITALS — BP 120/72 | HR 88 | Temp 98.0°F | Resp 16 | Ht 60.0 in | Wt 140.4 lb

## 2018-10-18 DIAGNOSIS — D899 Disorder involving the immune mechanism, unspecified: Secondary | ICD-10-CM

## 2018-10-18 DIAGNOSIS — R21 Rash and other nonspecific skin eruption: Secondary | ICD-10-CM

## 2018-10-18 DIAGNOSIS — Z853 Personal history of malignant neoplasm of breast: Secondary | ICD-10-CM

## 2018-10-18 DIAGNOSIS — I10 Essential (primary) hypertension: Secondary | ICD-10-CM

## 2018-10-18 DIAGNOSIS — E559 Vitamin D deficiency, unspecified: Secondary | ICD-10-CM

## 2018-10-18 DIAGNOSIS — E7800 Pure hypercholesterolemia, unspecified: Secondary | ICD-10-CM | POA: Diagnosis not present

## 2018-10-18 DIAGNOSIS — D849 Immunodeficiency, unspecified: Secondary | ICD-10-CM

## 2018-10-18 DIAGNOSIS — M8580 Other specified disorders of bone density and structure, unspecified site: Secondary | ICD-10-CM

## 2018-10-18 DIAGNOSIS — I700 Atherosclerosis of aorta: Secondary | ICD-10-CM | POA: Diagnosis not present

## 2018-10-18 DIAGNOSIS — I83813 Varicose veins of bilateral lower extremities with pain: Secondary | ICD-10-CM | POA: Diagnosis not present

## 2018-10-18 DIAGNOSIS — M19041 Primary osteoarthritis, right hand: Secondary | ICD-10-CM

## 2018-10-18 DIAGNOSIS — F325 Major depressive disorder, single episode, in full remission: Secondary | ICD-10-CM

## 2018-10-18 DIAGNOSIS — I69359 Hemiplegia and hemiparesis following cerebral infarction affecting unspecified side: Secondary | ICD-10-CM

## 2018-10-18 DIAGNOSIS — E039 Hypothyroidism, unspecified: Secondary | ICD-10-CM

## 2018-10-18 DIAGNOSIS — K5000 Crohn's disease of small intestine without complications: Secondary | ICD-10-CM

## 2018-10-18 DIAGNOSIS — B001 Herpesviral vesicular dermatitis: Secondary | ICD-10-CM

## 2018-10-18 DIAGNOSIS — K219 Gastro-esophageal reflux disease without esophagitis: Secondary | ICD-10-CM

## 2018-10-18 MED ORDER — TRIAMCINOLONE ACETONIDE 0.025 % EX OINT: 1.0000 "application " | TOPICAL_OINTMENT | Freq: Two times a day (BID) | CUTANEOUS | 0 refills | Status: DC

## 2018-10-18 NOTE — Patient Instructions (Signed)
Take 1/2 tablet of your zoloft once daily until next visit.

## 2018-10-18 NOTE — Progress Notes (Signed)
Name: Carolyn Campbell   MRN: [id number]    DOB: 1955/11/11   Date:10/18/2018       Progress Note  Subjective  Chief Complaint  Chief Complaint  Patient presents with  . Follow-up    3 month recheck  . Rash    on breast    HPI  HTN: BP is normal today, denies lightheadedness/orthostatic lightheadedness. She does not want to stop HCTZ due to occasional BLE edema. She forgets to take benazepril several nights out of the week, so we will stop and see how she does on HCTZ 12.70m alone. She denies chest pain, shortness of breath, headaches, vision changes (does note that she has glaucoma). BP's at home have been 120/60's.  Varicose Veins: Saw vascular and was told to wear compression stockings - next follow up in 1 month. She also notes varicose veins in bilateral upper thighs that are starting to become more prominent and painful.  She is doing okay with compression stockings - pain is not improving much.  Atherosclerosis of aorta:Taking lipitor every night, no SE's. Not taking aspirin due to Crohn's.  S/p CVA with left side weakness: States left side weakness is same.  She endorses ongoing intermittent difficulty saying/choosing the correct words, but this has been stable. On statin & BP meds; lipids and BP well controlled. Las lipid was at goal.  Hyperlipidemia: Stable-taking statin and denies myalgia, chest pain or SOB, we will recheck labs. Patient ate ham sandwich  Immunosuppression secondary to Chron's disease and medication use: Follows up with Dr. VMarius Ditch last appointment was on 05/23/18, needs to schedule follow up soon.  Patient taking Lialda BID- got payment assistance and is still taking this medication; she is off the Imuran.  Does note one episode last month of having abdominal pain when eating. Patient denies pain with BM or BRB when wiping; no abdominal pain, no vomiting.  GERD Diet controlled- does not take protonix anymore; sees GI, avoids dietary triggers.  Stable.  Hypothyroidism: Synthroid 557m has been on stable dose for awhile, denies weight changes, bowel changes, skin/hair/nail changes. No history of thyroid nodules or enlarged thyroid.  Will check labs at next visit.  Hx breast cancer:Has mammogram scheduled 08/08/2018.  Is doing mammograms once yearly, did do radiation in 2006.  She is feeling well now.  Does have itching rash on LEFT breast over the last month.  No lumps, no nipple discharge.  Chronic back pain with radiculitis: Has not been having back pain in quite some time - well controlled with gabapentin. Takes gabapentin every night.Stable without changes.  Arthritis of right hand:states worse when she uses it a lot; patient states typically just deals with the pain sometimes does hand stretching for relief. Has voltaren gel, does have some stiffness and weaker grip at times.  Fever blisters: States takes valtrex as needed, for fever blisters around mouth, no recent outbreaks.   Osteopenia - taking Fossamax weekly. Last bone density was Sept 2018 - due at next visit for repeat DEXA scan.  Depression/ Anxiety  Takes zoloft every day- states it is well controlled on this dosehas probably been on it for 15 years, she would like to decrease and eventually come off of the medication if she can. States has had depression due to husbands health. Denies si/hi.  Was supposed to decreased to 2542mt last visit, but forgot - we will decrease to 69m49mtil next visit, then wean off if tolerating well. Depression screen PHQ [facility] 10/18/2018 07/19/2018 05/12/2018  04/11/2018 03/28/2018  Decreased Interest 0 0 0 0 0  Down, Depressed, Hopeless 0 0 0 0 0  PHQ - 2 Score 0 0 0 0 0  Altered sleeping 0 0 0 0 1  Tired, decreased energy 0 0 0 0 1  Change in appetite 0 0 0 0 0  Feeling bad or failure about yourself  0 0 0 0 0  Trouble concentrating 0 0 0 0 0  Moving slowly or fidgety/restless 0 0 0 0 0  Suicidal thoughts 0 0 0 0 0  PHQ-9 Score 0 0  0 0 2  Difficult doing work/chores Not difficult at all Not difficult at all Not difficult at all Not difficult at all Not difficult at all   Vitamin D Deficiency: Not taking supplementation - discussed adding OTC back to her regimen. Endorses chronic fatigue- states has improved with being more active.  Patient Active Problem List   Diagnosis Date Noted  . Vitamin D deficiency 07/19/2018  . Fever blister 07/19/2018  . Varicose veins of both lower extremities with pain 07/19/2018  . Arthritis of right hand 12/21/2017  . Immunosuppressed status ([place]) 12/21/2017  . Major depression in remission ([place]) 12/21/2017  . Fibrocystic breast changes 06/22/2017  . Dysphagia as late effect of stroke 03/20/2017  . Osteopenia 10/18/2016  . Atherosclerosis of aorta ([place]) 12/19/2015  . Vitreous degeneration 05/21/2015  . Bad memory 05/21/2015  . Chronic ulcerative proctitis ([place]) 05/21/2015  . Glaucoma associated with chamber angle anomalies 05/21/2015  . Dermatitis, eczematoid 05/21/2015  . CD (Crohn's disease) ([place]) 05/21/2015  . H/O malignant neoplasm of breast 05/21/2015  . Crohn's disease of both small and large intestine with rectal bleeding ([place]) 12/04/2014  . Solitary pulmonary nodule 11/07/2012  . 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 Cognitive deficits as late effect of cerebrovascular disease 01/20/2010  . CVA, old, hemiparesis ([place]) 04/28/2009  . Adult hypothyroidism 06/12/2008  . Acid reflux 05/23/2007  . Hypercholesterolemia without hypertriglyceridemia 01/18/2007  . Benign essential HTN 01/18/2007    Past Surgical History:  Procedure Laterality Date  . BREAST BIOPSY Left 2006   POS  . BREAST LUMPECTOMY Left 09/20/2004   positive  . BREAST SURGERY Left    malignant biopsy  . CARDIAC CATHETERIZATION    . COLONOSCOPY  2015  . COLONOSCOPY WITH PROPOFOL N/A 06/08/2017   Procedure: COLONOSCOPY WITH PROPOFOL;  Surgeon: Lin Landsman, MD;   Location: [place] [place];  Service: Gastroenterology;  Laterality: N/A;  . ESOPHAGOGASTRODUODENOSCOPY  2015  . ESOPHAGOGASTRODUODENOSCOPY (EGD) WITH PROPOFOL N/A 06/08/2017   Procedure: ESOPHAGOGASTRODUODENOSCOPY (EGD) WITH PROPOFOL;  Surgeon: Lin Landsman, MD;  Location: [place];  Service: Gastroenterology;  Laterality: N/A;  . FINGER SURGERY     Right small finger  . [place] STUDY  2015  . TUBAL LIGATION      Family History  Problem Relation Age of Onset  . Heart disease Mother   . Heart attack Mother   . Breast cancer Sister   . Alcohol abuse Father   . Cerebrovascular Accident Father   . Hypertension Unknown   . Diabetes Unknown   . Breast cancer Sister 41  . Heart attack Other 61    Social History   Socioeconomic History  . Marital status: Married    Spouse name: Not on file  . Number of children: Not on file  . Years of education: Not on file  . Highest education level: Not on file  Occupational History  . Not on file  Social Needs  . Financial resource strain: Not on file  . Food insecurity:    Worry: Not on file    Inability: Not on file  . Transportation needs:    Medical: Not on file    Non-medical: Not on file  Tobacco Use  . Smoking status: Never Smoker  . Smokeless tobacco: Never Used  Substance and Sexual Activity  . Alcohol use: No    Alcohol/week: 0.0 standard drinks  . Drug use: No  . Sexual activity: Yes    Partners: Male  Lifestyle  . Physical activity:    Days per week: Not on file    Minutes per session: Not on file  . Stress: Not on file  Relationships  . Social connections:    Talks on phone: Not on file    Gets together: Not on file    Attends religious service: Not on file    Active member of club or organization: Not on file    Attends meetings of clubs or organizations: Not on file    Relationship status: Not on file  . Intimate partner violence:    Fear of current or ex partner: Not on file     Emotionally abused: Not on file    Physically abused: Not on file    Forced sexual activity: Not on file  Other Topics Concern  . Not on file  Social History Narrative   Married. One son and one daughter. She is disabled after she had breast cancer and strokes. 2 caffeinated beverages daily.     Current Outpatient Medications:  .  alendronate (FOSAMAX) 70 MG tablet, TAKE 1 TABLET BY MOUTH ONCE A WEEK. TAKE WITH A FULL  GLASS OF WATER ON AN EMPTY  STOMACH., Disp: 12 tablet, Rfl: 3 .  atorvastatin (LIPITOR) 40 MG tablet, Take 1 tablet (40 mg total) by mouth daily., Disp: 90 tablet, Rfl: 0 .  benazepril (LOTENSIN) 10 MG tablet, Take 1 tablet (10 mg total) by mouth daily., Disp: 90 tablet, Rfl: 0 .  diclofenac sodium (VOLTAREN) 1 % GEL, Apply 2 g topically 4 (four) times daily., Disp: 100 g, Rfl: 0 .  gabapentin (NEURONTIN) 300 MG capsule, Take 1 capsule (300 mg total) by mouth at bedtime., Disp: 90 capsule, Rfl: 0 .  hydrochlorothiazide (HYDRODIURIL) 12.5 MG tablet, Take 1 tablet (12.5 mg total) by mouth daily., Disp: 90 tablet, Rfl: 1 .  latanoprost (XALATAN) 0.005 % ophthalmic solution, Place 1 drop into both eyes at bedtime. , Disp: , Rfl:  .  levothyroxine (SYNTHROID, LEVOTHROID) 50 MCG tablet, Take 1 tablet (50 mcg total) by mouth daily., Disp: 90 tablet, Rfl: 0 .  Multiple Vitamin (MULTIVITAMIN) tablet, Take 1 tablet by mouth daily., Disp: , Rfl:  .  sertraline (ZOLOFT) 50 MG tablet, Take 0.5 tablets (25 mg total) by mouth daily., Disp: 90 tablet, Rfl: 0 .  valACYclovir (VALTREX) 1000 MG tablet, Take 1 tablet (1,000 mg total) by mouth 2 (two) times daily., Disp: 30 tablet, Rfl: 5 .  mesalamine (LIALDA) 1.2 g EC tablet, Take 1 tablet (1.2 g total) by mouth QID., Disp: 360 tablet, Rfl: 0  Allergies  Allergen Reactions  . Other Palpitations    I personally reviewed active problem list, medication list, allergies, notes from last encounter, lab results with the patient/caregiver  today.   ROS Constitutional: Negative for fever or weight change.  Respiratory: Negative for cough and shortness of breath.   Cardiovascular:  Negative for chest pain or palpitations.  Gastrointestinal: Negative for abdominal pain, no bowel changes.  Musculoskeletal: Negative for gait problem or joint swelling.  Skin: Negative for rash.  Neurological: Negative for dizziness or headache.  No other specific complaints in a complete review of systems (except as listed in HPI above)  Objective  Vitals:   10/18/18 1109  BP: 120/72  Pulse: 88  Resp: 16  Temp: 98 F (36.7 C)  TempSrc: Oral  SpO2: 93%  Weight: 140 lb 6.4 oz (63.7 kg)  Height: 5' (1.524 m)   Body mass index is 27.42 kg/m.  Physical Exam Constitutional: Patient appears well-developed and well-nourished. No distress.  HENT: Head: Normocephalic and atraumatic. Nose: Nose normal.  Eyes: Conjunctivae and EOM are normal. No scleral icterus. Neck: Normal range of motion. Neck supple. No JVD present. No thyromegaly present.  Cardiovascular: Normal rate, regular rhythm and normal heart sounds.  No murmur heard. No BLE edema. Pulmonary/Chest: Effort normal and breath sounds normal. No respiratory distress. Musculoskeletal: Normal range of motion, no joint effusions. No gross deformities Neurological: Pt is alert and oriented to person, place, and time. No cranial nerve deficit. Coordination, balance, strength, speech and gait are normal.  Skin: Skin is warm and dry. Superficial dry patch to the 11:00-2:00 area of the breast - no nipple discharge or inversion, no underlying mass. No erythema.  Psychiatric: Patient has a normal mood and affect. behavior is normal. Judgment and thought content normal.  No results found for this or any previous visit (from the past 72 hour(s)).  PHQ2/9: Depression screen [facility] 2/9 10/18/2018 07/19/2018 05/12/2018 04/11/2018 03/28/2018  Decreased Interest 0 0 0 0 0  Down, Depressed, Hopeless 0 0 0 0 0   PHQ - 2 Score 0 0 0 0 0  Altered sleeping 0 0 0 0 1  Tired, decreased energy 0 0 0 0 1  Change in appetite 0 0 0 0 0  Feeling bad or failure about yourself  0 0 0 0 0  Trouble concentrating 0 0 0 0 0  Moving slowly or fidgety/restless 0 0 0 0 0  Suicidal thoughts 0 0 0 0 0  PHQ-9 Score 0 0 0 0 2  Difficult doing work/chores Not difficult at all Not difficult at all Not difficult at all Not difficult at all Not difficult at all    Fall Risk: Fall Risk  10/18/2018 07/19/2018 05/12/2018 04/11/2018 03/28/2018  Falls in the past year? 0 Yes Yes Yes No  Number falls in past yr: 0 1 1 1  -  Injury with Fall? 0 No No No -  Follow up Falls evaluation completed - - - -    Assessment & Plan  1. Benign essential HTN - D/C benazepril, return in 2 weeks for CMA BP check3  2. Varicose veins of both lower extremities with pain - Stable with compression stockings, seeing vascular  3. Atherosclerosis of aorta (HCC) - Continue Statin therapy  4. Hypercholesterolemia without hypertriglyceridemia - Continue Statin therapy  5. CVA, old, hemiparesis ([place]) - On statin, no changes.  6. Crohn's disease of small intestine without complication (HCC) - Seeing Dr. Marius Ditch, continue current medication regimen,  7. Immunosuppressed status ([place]) - Stable  8. Gastroesophageal reflux disease, esophagitis presence not specified - Stable  9. Adult hypothyroidism - Will check labs at next visit  10. Arthritis of right hand - Stable  11. Osteopenia, unspecified location - Taking Fossamax, due for DEXA at next visit.  12. Major depression in  remission ([place]) - Decrease to 35m Zoloft until next visit, then will come off of medication.  13. H/O malignant neoplasm of breast - Stable, annual mammograms  14. Fever blister - Stable,, valtrex PRN  15. Vitamin D deficiency - Needs to restart supplementation OTC  16. Rash and nonspecific skin eruption - triamcinolone (KENALOG) 0.025 % ointment; Apply 1  application topically 2 (two) times daily.  Dispense: 30 g; Refill: 0

## 2018-10-19 ENCOUNTER — Ambulatory Visit: Payer: Medicare Other | Admitting: Family Medicine

## 2018-10-24 ENCOUNTER — Telehealth: Payer: Self-pay | Admitting: Family Medicine

## 2018-10-24 ENCOUNTER — Encounter: Payer: Self-pay | Admitting: Gastroenterology

## 2018-10-24 ENCOUNTER — Ambulatory Visit (INDEPENDENT_AMBULATORY_CARE_PROVIDER_SITE_OTHER): Payer: Medicare Other | Admitting: Gastroenterology

## 2018-10-24 VITALS — BP 130/60 | HR 76 | Resp 17 | Ht 60.0 in | Wt 138.2 lb

## 2018-10-24 DIAGNOSIS — K5080 Crohn's disease of both small and large intestine without complications: Secondary | ICD-10-CM | POA: Diagnosis not present

## 2018-10-24 NOTE — Telephone Encounter (Signed)
I called the patient to schedule her AWV with Nurse Health Advisor, Preston, on 11/02/2018.  She wasn't in, so I'll call back later. VDM (DD)

## 2018-10-24 NOTE — Progress Notes (Signed)
[name], MD [address]  [place]  [place],  [address]  Main: [phone]  Fax: [phone]    Gastroenterology Consultation  Referring Provider:     Hubbard Hartshorn, FNP Primary Care Physician:  Hubbard Hartshorn, FNP Primary Gastroenterologist:  Dr. [name] Reason for Consultation: Crohn's Disease        HPI:   Carolyn Campbell is a 63 y.o. African-American female referred by Dr. Uvaldo Rising, Astrid Divine, FNP  for consultation & management of Crohn's disease. This was diagnosed in 2015, currently on Imuran 50 mg daily. She reports intermittent flareups of her Crohn's disease such as abdominal pain, bloating, rectal bleeding. She received prednisone for flare up. She had 2 flareups within last 1 year. She recently is suffering with constipation, had to take laxatives to have a BM. Her last BM was 3 days ago. She does report some upper abdominal bloating. She denies any weight loss, rectal bleeding, diarrhea, nausea, vomiting. She is not on any prednisone currently. She denies taking NSAIDs.  Follow up 06/21/2017: She underwent EGD and colonoscopy. EGD was normal, colonoscopy revealed normal mucosa but the biopsies showed mild active chronic ileitis and right-sided colitis. No granulomas were seen Doing well overall. Having BM every other day, denies feeling constipated. She does have mild abdominal discomfort after a BM which lasts for about a minute. She continues to take Imuran 50 mg daily. She is trying to lose weight  Follow-up visit 01/17/2018 Patient had an episode of flare up in 09/2017 when she developed right lower quadrant discomfort associated with constipation, pain persisted after relief of constipation which lasted for about 2 weeks. She did not call my office and on the time. She did not take any prednisone, no ER visits. She is currently on Lialda 1.2 g twice daily. She denies having ongoing symptoms. She reports having regular bowel movements and her weight  has been stable. Her most recent blood work revealed mild normocytic anemia.  Follow up visit 02/16/2018 She denies any complaints today. She is currently on Lialda 1.2 g 4 pills daily. She was briefly on 6 pills per day when she had worsened symptoms about 3 weeks ago. Her CRP is improving. Her iron levels are normal, however she is mildly anemic. She is currently taking oral iron daily. She now has insurance from the [place] and requests future prescriptions to be sent to the [facility] so that she doesn't need to pay any co-pay.  Follow-up visit 05/23/18 She reports right-sided headache radiating to the right ear and right sided chest. Also, feels like a knot in the epigastric region. She denies any diarrhea or abdominal pain. She is tolerating Lialda 4.8 g daily. Her weight is stable  Follow up visit 10/24/18 She thinks she has flare up of crohn's as she has mild abdominal discomfort that limited her PO intake. She continues to have atypical chest discomfort for which she is taking tums. She mentioned about chest pain during last visit, I urged her to see her PCP which she did not yet  Crohn's disease classification:  Age: > 40 Location: ileocolonic  Behavior: non stricturing, non penetrating  Perianal: no  IBD diagnosis: 11/2013  Disease course:Crohn's disease in 2015, initial symptoms were abdominal pain, blood in stool and on wiping, bloating. She had a colonoscopy, VCE, EGD at the time of diagnosis. She was initially on mesalamine 4 pills daily, prednisone later switched to Remicade in 11/2014. She took only 5 doses  as she could not afford. Then she was switched to Imuran 50 mg daily. She had mild flareups about twice a year. Colonoscopy 05/2017 revealed mild ileocolonic disease primarily on histology. She self discontinued Imuran. Lialda started in 06/2017  Extra intestinal manifestations: None  IBD surgical history: None No family history of IBD Imaging:  MRE none CTE none SBFT  none  Procedures:  Colonoscopy : 01/28/2005, showed external hemorrhoids only Colonoscopy 12/10/2013, showed normal terminal ileum, biopsies performed. Skipped areas of nonbleeding ulcerative mucosa were seen in the entire colon, biopsies performed. 4 mm polyp in the sigmoid colon Pathology: Rectal biopsy mild-to-moderate active proctitis with architectural features of chronicity, negative for dysplasia and malignancy. Terminal ileum: Small bowel mucosa with preserved villous architecture. Right colon biopsy: No significant pathologic changes  Upper Endoscopy 12/10/2013, underwent dilation of the esophagus Ampullary biopsy: Normal, gastric biopsy: Intramucosal with erosion and mild chronic gastritis, negative for H. pylori, dysplasia and malignancy  VCE 05/06/2014: Erosions in the distal ileum  EGD 06/08/2017 Normal esophagus, small hiatal hernia, normal stomach and duodenum  Colonoscopy 06/08/2017 The perianal and digital rectal examinations were normal. Pertinent negatives include normal sphincter tone and no palpable rectal Lesions. The terminal ileum appeared normal. Biopsies were taken with a cold forceps for histology. Two scattered non-bleeding aphthae were found in the ascending colon. Rest of the colon and rectum appeared normal. Random biopsies performed   IBD medications:  Steroids: Prednisone, budesonide 5-ASA: mesalamine, Lialda Immunomodulators: stopped azathioprine  TPMT status unknown Biologics:  Anti TNFs: Remicade monotherapy, received induction followed by 1 maintenance dose in 11/2014 Anti Integrins: Ustekinumab: Tofactinib: Clinical trial:   Past Medical History:  Diagnosis Date  . Allergic rhinitis   . Anginal pain ([place])    states MD said it was GERD  . Arthritis   . Asthma   . Breast cancer ([place]) 2006   LT LUMPECTOMY  . CD (Crohn's disease) ([place]) 05/21/2015   FOLLOWED BY GI   . Cognitive deficits as late effect of cerebrovascular disease   . COPD  (chronic obstructive pulmonary disease) ([place])   . Crohn's disease of both small and large intestine with rectal bleeding ([place]) 12/04/2014  . 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 Dyspnea   . Esophageal reflux   . Glaucoma    vitreous degeneration  . History of kidney stones   . Hyperlipidemia   . Hypertension   . Hypothyroidism   . Occlusion, cerebral artery    NOS w/infarction  . Osteoporosis   . Personal history of radiation therapy 2006   BREAST CA  . Stroke [facility])     Past Surgical History:  Procedure Laterality Date  . BREAST BIOPSY Left 2006   POS  . BREAST LUMPECTOMY Left 09/20/2004   positive  . BREAST SURGERY Left    malignant biopsy  . CARDIAC CATHETERIZATION    . COLONOSCOPY  2015  . COLONOSCOPY WITH PROPOFOL N/A 06/08/2017   Procedure: COLONOSCOPY WITH PROPOFOL;  Surgeon: Lin Landsman, MD;  Location: [facility];  Service: Gastroenterology;  Laterality: N/A;  . ESOPHAGOGASTRODUODENOSCOPY  2015  . ESOPHAGOGASTRODUODENOSCOPY (EGD) WITH PROPOFOL N/A 06/08/2017   Procedure: ESOPHAGOGASTRODUODENOSCOPY (EGD) WITH PROPOFOL;  Surgeon: Lin Landsman, MD;  Location: [place];  Service: Gastroenterology;  Laterality: N/A;  . FINGER SURGERY     Right small finger  . [place] STUDY  2015  . TUBAL LIGATION       Current Outpatient Medications:  .  alendronate (  FOSAMAX) 70 MG tablet, TAKE 1 TABLET BY MOUTH ONCE A WEEK. TAKE WITH A FULL  GLASS OF WATER ON AN EMPTY  STOMACH., Disp: 12 tablet, Rfl: 3 .  atorvastatin (LIPITOR) 40 MG tablet, Take 1 tablet (40 mg total) by mouth daily., Disp: 90 tablet, Rfl: 0 .  diclofenac sodium (VOLTAREN) 1 % GEL, Apply 2 g topically 4 (four) times daily., Disp: 100 g, Rfl: 0 .  gabapentin (NEURONTIN) 300 MG capsule, Take 1 capsule (300 mg total) by mouth at bedtime., Disp: 90 capsule, Rfl: 0 .  hydrochlorothiazide (HYDRODIURIL) 12.5 MG tablet, Take 1 tablet (12.5 mg total) by mouth daily.,  Disp: 90 tablet, Rfl: 1 .  latanoprost (XALATAN) 0.005 % ophthalmic solution, Place 1 drop into both eyes at bedtime. , Disp: , Rfl:  .  levothyroxine (SYNTHROID, LEVOTHROID) 50 MCG tablet, Take 1 tablet (50 mcg total) by mouth daily., Disp: 90 tablet, Rfl: 0 .  Multiple Vitamin (MULTIVITAMIN) tablet, Take 1 tablet by mouth daily., Disp: , Rfl:  .  sertraline (ZOLOFT) 50 MG tablet, Take 0.5 tablets (25 mg total) by mouth daily., Disp: 90 tablet, Rfl: 0 .  triamcinolone (KENALOG) 0.025 % ointment, Apply 1 application topically 2 (two) times daily., Disp: 30 g, Rfl: 0 .  valACYclovir (VALTREX) 1000 MG tablet, Take 1 tablet (1,000 mg total) by mouth 2 (two) times daily., Disp: 30 tablet, Rfl: 5 .  mesalamine (LIALDA) 1.2 g EC tablet, Take 1 tablet (1.2 g total) by mouth QID., Disp: 360 tablet, Rfl: 0   Family History  Problem Relation Age of Onset  . Heart disease Mother   . Heart attack Mother   . Breast cancer Sister   . Alcohol abuse Father   . Cerebrovascular Accident Father   . Hypertension Unknown   . Diabetes Unknown   . Breast cancer Sister 70  . Heart attack Other 62     Social History   Tobacco Use  . Smoking status: Never Smoker  . Smokeless tobacco: Never Used  Substance Use Topics  . Alcohol use: No    Alcohol/week: 0.0 standard drinks  . Drug use: No    Allergies as of 10/24/2018 - Review Complete 10/24/2018  Allergen Reaction Noted  . Other Palpitations 11/07/2012    Review of Systems:    All systems reviewed and negative except where noted in HPI.   Physical Exam:  BP 130/60 (BP Location: Left Arm, Patient Position: Sitting, Cuff Size: Normal)   Pulse 76   Resp 17   Ht 5' (1.524 m)   Wt 138 lb 3.2 oz (62.7 kg)   BMI 26.99 kg/m  No LMP recorded. Patient is postmenopausal.  General:   Alert,  Well-developed, well-nourished, pleasant and cooperative in NAD Head:  Normocephalic and atraumatic. Eyes:  Sclera clear, no icterus.   Conjunctiva pink. Ears:   Normal auditory acuity. Nose:  No deformity, discharge, or lesions. Mouth:  No deformity or lesions,oropharynx pink & moist. Neck:  Supple; no masses or thyromegaly. Lungs:  Respirations even and unlabored.  Clear throughout to auscultation.   No wheezes, crackles, or rhonchi. No acute distress. Heart:  Regular rate and rhythm; no murmurs, clicks, rubs, or gallops. Abdomen:  Normal bowel sounds.  No bruits.  Soft, nontender and non-distended without masses, hepatosplenomegaly or hernias noted.  No guarding or rebound tenderness.   Rectal: Nor performed Msk:  Symmetrical without gross deformities. Good, equal movement & strength bilaterally. Pulses:  Normal pulses noted. Extremities:  No clubbing or  edema.  No cyanosis. Neurologic:  Alert and oriented x3;  grossly normal neurologically. Skin:  Intact without significant lesions or rashes. No jaundice. Psych:  Alert and cooperative. Normal mood and affect.  Imaging Studies: MRI reviewed  Assessment and Plan:   Carolyn Campbell is a 63 y.o. African-American female with Terminal ileal and colonic Crohn's, diagnosed in 2015 who was previously maintained on Imuran 50 mg daily with mild intermittent flareups about twice a year needing prednisone. She does not have any endoscopy evidence of ileocolonic Crohn's disease other than a few scattered aphthae in the ascending colon on colonoscopy in 05/2017 but histology revealed chronic mild active ileitis and right-sided colitis. MR enterography did not inflammation in small bowel. I tried to give her budesonide but she could not afford high co-pay of $500 for 30 days supply.  She stopped Imuran by herself. Currently, she is on on Lialda 2.4 g twice a day, mild GI symptoms, unclear if it's flare up of crohn's or functional GI symptoms   continue Lialda 4.8 g daily  Recheck labs today  IBD Health Maintenance  1.TB status: Gold quantiferon negative on 12/06/2014 2. Anemia: normal Hb, vitamin B12 and  ferritin levels are normal 3.Immunizations: Hep A and B not immune,received 1 dose in 06/2017, Influenza vaccine received in 06/2017, prevnar  Received in 06/2017, pneumovax not received, recommend 4.Cancer screening I) Colon cancer/dysplasia surveillance: Colonoscopy 05/2017, no evidence of dysplasia and no polyps identified. Repeat colonoscopy in 5 years or sooner if she develops any flare up of symptoms of Crohn's disease II) Cervical cancer: n/a III) Skin cancer - n/a  5.Bone health Vitamin D status: Normal Bone density testing: Not done 5. Labs: today 6. Smoking: Never smoked 7. NSAIDs and Antibiotics use: Denies NSAID use and antibiotic use Advised her to discuss about unilateral headaches with her primary care doctor 8. Atypical chest pain, follow up with her PCP  Follow up in 4 months   [name], MD

## 2018-10-25 ENCOUNTER — Encounter: Payer: Self-pay | Admitting: Gastroenterology

## 2018-10-25 LAB — COMPREHENSIVE METABOLIC PANEL
ALT: 11 IU/L (ref 0–32)
AST: 15 IU/L (ref 0–40)
Albumin/Globulin Ratio: 2.1 (ref 1.2–2.2)
Albumin: 4.7 g/dL (ref 3.8–4.8)
Alkaline Phosphatase: 97 IU/L (ref 39–117)
BUN/Creatinine Ratio: 15 (ref 12–28)
BUN: 13 mg/dL (ref 8–27)
Bilirubin Total: 0.5 mg/dL (ref 0.0–1.2)
CO2: 24 mmol/L (ref 20–29)
Calcium: 9.9 mg/dL (ref 8.7–10.3)
Chloride: 99 mmol/L (ref 96–106)
Creatinine, Ser: 0.87 mg/dL (ref 0.57–1.00)
GFR calc Af Amer: 81 mL/min/{1.73_m2} (ref >59)
GFR calc non Af Amer: 71 mL/min/{1.73_m2} (ref >59)
Globulin, Total: 2.2 g/dL (ref 1.5–4.5)
Glucose: 95 mg/dL (ref 65–99)
Potassium: 3.6 mmol/L (ref 3.5–5.2)
Sodium: 140 mmol/L (ref 134–144)
Total Protein: 6.9 g/dL (ref 6.0–8.5)

## 2018-10-25 LAB — C-REACTIVE PROTEIN: CRP: 10 mg/L (ref 0–10)

## 2018-10-25 LAB — CBC
Hematocrit: 39.5 % (ref 34.0–46.6)
Hemoglobin: 12.3 g/dL (ref 11.1–15.9)
MCH: 28.8 pg (ref 26.6–33.0)
MCHC: 31.1 g/dL — ABNORMAL LOW (ref 31.5–35.7)
MCV: 93 fL (ref 79–97)
Platelets: 220 10*3/uL (ref 150–450)
RBC: 4.27 x10E6/uL (ref 3.77–5.28)
RDW: 13.1 % (ref 11.7–15.4)
WBC: 4.7 10*3/uL (ref 3.4–10.8)

## 2018-10-27 ENCOUNTER — Encounter (INDEPENDENT_AMBULATORY_CARE_PROVIDER_SITE_OTHER): Payer: Self-pay | Admitting: Vascular Surgery

## 2018-10-27 ENCOUNTER — Ambulatory Visit (INDEPENDENT_AMBULATORY_CARE_PROVIDER_SITE_OTHER): Payer: Medicare Other | Admitting: Vascular Surgery

## 2018-10-27 ENCOUNTER — Ambulatory Visit (INDEPENDENT_AMBULATORY_CARE_PROVIDER_SITE_OTHER): Payer: Medicare Other

## 2018-10-27 VITALS — BP 148/76 | HR 90 | Resp 14 | Ht 60.0 in | Wt 137.8 lb

## 2018-10-27 DIAGNOSIS — I83813 Varicose veins of bilateral lower extremities with pain: Secondary | ICD-10-CM

## 2018-10-27 DIAGNOSIS — I10 Essential (primary) hypertension: Secondary | ICD-10-CM

## 2018-10-27 DIAGNOSIS — I700 Atherosclerosis of aorta: Secondary | ICD-10-CM

## 2018-10-27 NOTE — Assessment & Plan Note (Signed)
blood pressure control important in reducing the progression of atherosclerotic disease. On appropriate oral medications.  

## 2018-10-27 NOTE — Progress Notes (Signed)
MRN : [id number]  Carolyn Campbell is a 63 y.o. (10/29/1955) female who presents with chief complaint of  Chief Complaint  Patient presents with  . Follow-up    3 MONTH  .  History of Present Illness: Patient returns today in follow up of leg pain and swelling.  The left leg has more swelling and heaviness whereas the right leg has more pain behind the knee and radiating down the leg.  No new ulceration or infection.  She has been diligently wearing 20 to 30 mmHg compression stocking since her original visit, and this is helped slightly.  She does have superficial varicosities which are irritating, itchy and painful bilaterally.  Noninvasive studies performed today showed normal ABIs of 1.0 bilaterally with normal digital pressures and waveforms consistent with no significant arterial insufficiency.  A venous study showed no obvious venous abnormalities on the right but a moderate sized Baker's cyst was present behind the right knee.  On the left, no DVT or superficial thrombophlebitis was seen, but reflux was seen in the great saphenous vein as well as the small saphenous vein.  Current Outpatient Medications  Medication Sig Dispense Refill  . alendronate (FOSAMAX) 70 MG tablet TAKE 1 TABLET BY MOUTH ONCE A WEEK. TAKE WITH A FULL  GLASS OF WATER ON AN EMPTY  STOMACH. 12 tablet 3  . atorvastatin (LIPITOR) 40 MG tablet Take 1 tablet (40 mg total) by mouth daily. 90 tablet 0  . diclofenac sodium (VOLTAREN) 1 % GEL Apply 2 g topically 4 (four) times daily. 100 g 0  . gabapentin (NEURONTIN) 300 MG capsule Take 1 capsule (300 mg total) by mouth at bedtime. 90 capsule 0  . hydrochlorothiazide (HYDRODIURIL) 12.5 MG tablet Take 1 tablet (12.5 mg total) by mouth daily. 90 tablet 1  . latanoprost (XALATAN) 0.005 % ophthalmic solution Place 1 drop into both eyes at bedtime.     Marland Kitchen levothyroxine (SYNTHROID, LEVOTHROID) 50 MCG tablet Take 1 tablet (50 mcg total) by mouth daily. 90 tablet 0  . Multiple Vitamin  (MULTIVITAMIN) tablet Take 1 tablet by mouth daily.    . sertraline (ZOLOFT) 50 MG tablet Take 0.5 tablets (25 mg total) by mouth daily. 90 tablet 0  . triamcinolone (KENALOG) 0.025 % ointment Apply 1 application topically 2 (two) times daily. 30 g 0  . valACYclovir (VALTREX) 1000 MG tablet Take 1 tablet (1,000 mg total) by mouth 2 (two) times daily. 30 tablet 5  . mesalamine (LIALDA) 1.2 g EC tablet Take 1 tablet (1.2 g total) by mouth QID. 360 tablet 0   No current facility-administered medications for this visit.     Past Medical History:  Diagnosis Date  . Allergic rhinitis   . Anginal pain ([place])    states MD said it was GERD  . Arthritis   . Asthma   . Breast cancer ([place]) 2006   LT LUMPECTOMY  . CD (Crohn's disease) ([place]) 05/21/2015   FOLLOWED BY GI   . Cognitive deficits as late effect of cerebrovascular disease   . COPD (chronic obstructive pulmonary disease) ([place])   . Crohn's disease of both small and large intestine with rectal bleeding ([place]) 12/04/2014  . 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 Dyspnea   . Esophageal reflux   . Fever blister 07/19/2018  . Glaucoma    vitreous degeneration  . History of kidney stones   . Hyperlipidemia   . Hypertension   . Hypothyroidism   . Occlusion,  cerebral artery    NOS w/infarction  . Osteoporosis   . Personal history of radiation therapy 2006   BREAST CA  . Stroke [facility])     Past Surgical History:  Procedure Laterality Date  . BREAST BIOPSY Left 2006   POS  . BREAST LUMPECTOMY Left 09/20/2004   positive  . BREAST SURGERY Left    malignant biopsy  . CARDIAC CATHETERIZATION    . COLONOSCOPY  2015  . COLONOSCOPY WITH PROPOFOL N/A 06/08/2017   Procedure: COLONOSCOPY WITH PROPOFOL;  Surgeon: Lin Landsman, MD;  Location: [place];  Service: Gastroenterology;  Laterality: N/A;  . ESOPHAGOGASTRODUODENOSCOPY  2015  . ESOPHAGOGASTRODUODENOSCOPY (EGD) WITH PROPOFOL N/A 06/08/2017   Procedure:  ESOPHAGOGASTRODUODENOSCOPY (EGD) WITH PROPOFOL;  Surgeon: Lin Landsman, MD;  Location: [place];  Service: Gastroenterology;  Laterality: N/A;  . FINGER SURGERY     Right small finger  . [place] STUDY  2015  . TUBAL LIGATION      Social History Social History   Tobacco Use  . Smoking status: Never Smoker  . Smokeless tobacco: Never Used  Substance Use Topics  . Alcohol use: No    Alcohol/week: 0.0 standard drinks  . Drug use: No     Family History Family History  Problem Relation Age of Onset  . Heart disease Mother   . Heart attack Mother   . Breast cancer Sister   . Alcohol abuse Father   . Cerebrovascular Accident Father   . Hypertension Unknown   . Diabetes Unknown   . Breast cancer Sister 40  . Heart attack Other 41    Allergies  Allergen Reactions  . Other Palpitations     REVIEW OF SYSTEMS (Negative unless checked)  Constitutional: [] Weight loss  [] Fever  [] Chills Cardiac: [] Chest pain   [] Chest pressure   [] Palpitations   [] Shortness of breath when laying flat   [] Shortness of breath at rest   [] Shortness of breath with exertion. Vascular:  [x] Pain in legs with walking   [x] Pain in legs at rest   [] Pain in legs when laying flat   [] Claudication   [] Pain in feet when walking  [] Pain in feet at rest  [] Pain in feet when laying flat   [] History of DVT   [] Phlebitis   [x] Swelling in legs   [x] Varicose veins   [] Non-healing ulcers Pulmonary:   [] Uses home oxygen   [] Productive cough   [] Hemoptysis   [] Wheeze  [] COPD   [x] Asthma Neurologic:  [] Dizziness  [] Blackouts   [] Seizures   [x] History of stroke   [] History of TIA  [] Aphasia   [] Temporary blindness   [] Dysphagia   [] Weakness or numbness in arms   [] Weakness or numbness in legs Musculoskeletal:  [x] Arthritis   [] Joint swelling   [] Joint pain   [] Low back pain Hematologic:  [] Easy bruising  [] Easy bleeding   [] Hypercoagulable state   [] Anemic   Gastrointestinal:  [] Blood in stool    [] Vomiting blood  [] Gastroesophageal reflux/heartburn   [] Abdominal pain Genitourinary:  [] Chronic kidney disease   [] Difficult urination  [] Frequent urination  [] Burning with urination   [] Hematuria Skin:  [] Rashes   [] Ulcers   [] Wounds Psychological:  [] History of anxiety   []  History of major depression.  Physical Examination  BP (!) 148/76 (BP Location: Right Arm, Patient Position: Sitting)   Pulse 90   Resp 14   Ht 5' (1.524 m)   Wt 137 lb 12.8 oz (62.5 kg)   BMI 26.91 kg/m  Gen:  WD/WN, NAD Head: [place]/AT, No temporalis wasting. Ear/Nose/Throat: Hearing grossly intact, nares w/o erythema or drainage Eyes: Conjunctiva clear. Sclera non-icteric Neck: Supple.  Trachea midline Pulmonary:  Good air movement, no use of accessory muscles.  Cardiac: RRR, no JVD Vascular: Diffuse superficial varicosities bilaterally measuring 1 to 2 mm Vessel Right Left  Radial Palpable Palpable                          PT  1+ palpable  1+ palpable  DP Palpable Palpable   Gastrointestinal: soft, non-tender/non-distended. No guarding/reflex.  Musculoskeletal: M/S 5/5 throughout.  No deformity or atrophy.  Trace right lower extremity edema, 1+ left lower extremity edema. Neurologic: Sensation grossly intact in extremities.  Symmetrical.  Speech is fluent.  Psychiatric: Judgment intact, Mood & affect appropriate for pt's clinical situation. Dermatologic: No rashes or ulcers noted.  No cellulitis or open wounds.       Labs Recent Results (from the past 2160 hour(s))  Comprehensive metabolic panel     Status: None   Collection Time: 10/24/18 10:31 AM  Result Value Ref Range   Glucose 95 65 - 99 mg/dL   BUN 13 8 - 27 mg/dL   Creatinine, Ser 0.87 0.57 - 1.00 mg/dL   GFR calc non Af Amer 71 >59 mL/min/1.73   GFR calc Af Amer 81 >59 mL/min/1.73   BUN/Creatinine Ratio 15 12 - 28   Sodium 140 134 - 144 mmol/L   Potassium 3.6 3.5 - 5.2 mmol/L   Chloride 99 96 - 106 mmol/L   CO2 24 20 - 29  mmol/L   Calcium 9.9 8.7 - 10.3 mg/dL   Total Protein 6.9 6.0 - 8.5 g/dL   Albumin 4.7 3.8 - 4.8 g/dL    Comment:               **Please note reference interval change**   Globulin, Total 2.2 1.5 - 4.5 g/dL   Albumin/Globulin Ratio 2.1 1.2 - 2.2   Bilirubin Total 0.5 0.0 - 1.2 mg/dL   Alkaline Phosphatase 97 39 - 117 IU/L   AST 15 0 - 40 IU/L   ALT 11 0 - 32 IU/L  C-reactive protein     Status: None   Collection Time: 10/24/18 10:31 AM  Result Value Ref Range   CRP 10 0 - 10 mg/L  CBC     Status: Abnormal   Collection Time: 10/24/18 10:32 AM  Result Value Ref Range   WBC 4.7 3.4 - 10.8 x10E3/uL   RBC 4.27 3.77 - 5.28 x10E6/uL   Hemoglobin 12.3 11.1 - 15.9 g/dL   Hematocrit 39.5 34.0 - 46.6 %   MCV 93 79 - 97 fL   MCH 28.8 26.6 - 33.0 pg   MCHC 31.1 (L) 31.5 - 35.7 g/dL   RDW 13.1 11.7 - 15.4 %   Platelets 220 150 - 450 x10E3/uL    Radiology No results found.  Assessment/Plan  Benign essential HTN blood pressure control important in reducing the progression of atherosclerotic disease. On appropriate oral medications.   Atherosclerosis of aorta (HCC) ABIs were normal today.  No role for arterial revascularization.  Could be rechecked in several years.  Varicose veins of both lower extremities with pain Noninvasive studies performed today showed normal ABIs of 1.0 bilaterally with normal digital pressures and waveforms consistent with no significant arterial insufficiency.  A venous study showed no obvious venous abnormalities on the right but a moderate sized  Baker's cyst was present behind the right knee.  On the left, no DVT or superficial thrombophlebitis was seen, but reflux was seen in the great saphenous vein as well as the small saphenous vein. Given these findings, laser ablation of the left great saphenous vein and potentially the left small saphenous vein may be of benefit to improve the swelling and discomfort in the left leg.  Sclerotherapy to treat the painful  superficial varicosities could be performed bilaterally, but on the left laser ablation be performed first.  For her Baker's cyst, if she desires I will be happy to refer her to an orthopedic surgeon for further evaluation.  She should continue compression and anti-inflammatories as needed.  Patient voices her understanding and is agreeable to proceed    Leotis Pain, MD  10/27/2018 12:28 PM    This note was created with Dragon medical transcription system.  Any errors from dictation are purely unintentional

## 2018-10-27 NOTE — Assessment & Plan Note (Signed)
Noninvasive studies performed today showed normal ABIs of 1.0 bilaterally with normal digital pressures and waveforms consistent with no significant arterial insufficiency.  A venous study showed no obvious venous abnormalities on the right but a moderate sized Baker's cyst was present behind the right knee.  On the left, no DVT or superficial thrombophlebitis was seen, but reflux was seen in the great saphenous vein as well as the small saphenous vein. Given these findings, laser ablation of the left great saphenous vein and potentially the left small saphenous vein may be of benefit to improve the swelling and discomfort in the left leg.  Sclerotherapy to treat the painful superficial varicosities could be performed bilaterally, but on the left laser ablation be performed first.  For her Baker's cyst, if she desires I will be happy to refer her to an orthopedic surgeon for further evaluation.  She should continue compression and anti-inflammatories as needed.  Patient voices her understanding and is agreeable to proceed

## 2018-10-27 NOTE — Assessment & Plan Note (Signed)
ABIs were normal today.  No role for arterial revascularization.  Could be rechecked in several years.

## 2018-10-27 NOTE — Patient Instructions (Signed)
Nonsurgical Procedures for Varicose Veins Various nonsurgical procedures can be used to treat varicose veins. Varicose veins are swollen, twisted veins that are visible under the skin. They occur most often in the legs. These veins may appear blue and bulging. Varicose veins are caused by damage to the valves in veins. All veins have a valve that makes blood flow in only one direction. If a valve gets weak or damaged, blood can pool and cause varicose veins. You may need a procedure to treat your varicose veins if they are causing symptoms or complications, or if lifestyle changes have not helped. These procedures can reduce pain, aching, and the risk of bleeding and blood clots. They can also improve the way the affected area looks (cosmetic appearance). The three common nonsurgical procedures are:  Sclerotherapy. A chemical is injected to close off a vein.  Laser treatment. Light energy is applied to close off the vein.  Radiofrequency vein ablation. Electrical energy is used to produce heat that closes off the vein. Your health care provider will discuss the method that is best for you based on your condition. Tell a health care provider about:  Any allergies you have.  All medicines you are taking, including vitamins, herbs, eye drops, creams, and over-the-counter medicines.  Any problems you or family members have had with anesthetic medicines.  Any blood disorders you have.  Any surgeries you have had.  Any medical conditions you have.  Whether you are pregnant or may be pregnant. What are the risks? Generally, this is a safe procedure. However, problems may occur, including:  Damage to nearby nerves, tissues, or veins.  Skin irritation, sores, or dark spots.  Numbness.  Clotting.  Infection.  Allergic reactions to medicines.  Scarring.  Leg swelling.  Need for additional treatments.  Bruising. What happens before the procedure?  Ask your health care provider  about: ? Changing or stopping your regular medicines. This is especially important if you are taking diabetes medicines or blood thinners. ? Taking over-the-counter medicines, vitamins, herbs, and supplements. ? Taking medicines such as aspirin and ibuprofen. These medicines can thin your blood. Do not take these medicines unless your health care provider tells you to take them.  You may have an exam or testing. This can include a tests to: ? Check for clots and check blood flow using sound waves (Doppler ultrasound). ? Observe how blood flows through your veins by injecting a dye that outlines your veins on X-rays (angiogram). This test is used in rare cases. What happens during the procedure? One of the following procedures will be performed: Sclerotherapy This procedure is often used for small to medium veins.  A chemical (sclerosant) that irritates the lining of the vein will be injected into the vein. This will cause the varicose vein to be closed off. Sclerosants in different amounts and strengths can be used, depending on the size and location of the vein.  All of the varicose vein sites will be injected. You may need more than one treatment because new varicose veins may develop, or more than one injection may be needed for each varicose vein.  Laser treatment There are two ways that lasers are used to treat varicose veins:  Light energy from a laser may be directed onto the vein through the skin.  A needle may be used to pass a thin laser catheter into the vein to cause it to close. You may need more than one treatment if the vein re-opens. In some cases,  laser treatment may be combined with sclerotherapy. Radiofrequency vein ablation   You will be given a medicine that numbs the area (local anesthetic).  A small incision will be made near the varicose vein.  A thin tube (catheter) will be threaded into your vein.  The tip of the catheter will deploy electrodes.  The  electrodes will deliver electrical energy to produce heat that closes off the vein. What happens after the procedure?  A bandage (dressing) may be used to cover the injection site or incisions.  You may have to wear compression stockings. These stockings help to prevent blood clots and reduce swelling in your legs.  Return to your normal activities as told by your health care provider. Summary  Varicose veins are swollen, twisted veins that are visible under the skin. They occur most often in the legs.  Various procedures can be used to treat varicose veins. You may need a procedure to treat your varicose veins if they are causing symptoms or complications, or if lifestyle changes have not helped.  Your health care provider will discuss the method that is best for you based on your condition. This information is not intended to replace advice given to you by your health care provider. Make sure you discuss any questions you have with your health care provider. Document Released: 12/17/2016 Document Revised: 12/17/2016 Document Reviewed: 12/17/2016 Elsevier Interactive Patient Education  2019 [employer].

## 2018-10-29 LAB — CALPROTECTIN, FECAL: Calprotectin, Fecal: 174 ug/g — ABNORMAL HIGH (ref 0–120)

## 2018-10-30 ENCOUNTER — Telehealth: Payer: Self-pay | Admitting: Gastroenterology

## 2018-10-30 ENCOUNTER — Other Ambulatory Visit: Payer: Self-pay

## 2018-10-30 DIAGNOSIS — K5080 Crohn's disease of both small and large intestine without complications: Secondary | ICD-10-CM

## 2018-10-30 NOTE — Telephone Encounter (Signed)
Lab results reviewed with patient.  Fecal calprotectin levels are elevated.  CBC, CRP normal.  Recommend step up therapy, either immunomodulator or biologic Mesalamine is no longer recommended for Crohn's disease per recent guidelines Discussed with her either we could do colonoscopy, assess severity of the disease then decide the treatment or we could start with immunomodulator, recheck fecal calprotectin levels in 3 months, assess response before starting biologic. Given that patient has mild flareup, I will not start prednisone and I think it is reasonable to start with immunomodulator such as azathioprine  Patient prefers second option Check TPMT levels Check QuantiFERON gold Check hepatitis A and B status Patient will stop by our lab tomorrow  Plan to start azathioprine 50 mg daily after about test results  Patient expressed understanding of the plan  [name], MD [address]  [place]  [place], [place] [address]  Main: [phone]  Fax: [phone] Pager: [phone]

## 2018-11-01 LAB — HEPATITIS B SURFACE ANTIGEN: Hepatitis B Surface Ag: NEGATIVE

## 2018-11-01 LAB — HEPATITIS C ANTIBODY (REFLEX): HCV Ab: <0.1 s/co ratio (ref 0.0–0.9)

## 2018-11-01 LAB — HEPATITIS B CORE ANTIBODY, TOTAL: Hep B Core Total Ab: NEGATIVE

## 2018-11-01 LAB — HEPATITIS A ANTIBODY, TOTAL: Hep A Total Ab: POSITIVE — AB

## 2018-11-01 LAB — HCV COMMENT:

## 2018-11-02 ENCOUNTER — Ambulatory Visit: Payer: Medicare Other

## 2018-11-02 ENCOUNTER — Ambulatory Visit (INDEPENDENT_AMBULATORY_CARE_PROVIDER_SITE_OTHER): Payer: Medicare Other

## 2018-11-02 VITALS — BP 132/72 | HR 81

## 2018-11-02 VITALS — BP 132/72 | HR 81 | Temp 98.0°F | Resp 16 | Ht 60.0 in | Wt 137.2 lb

## 2018-11-02 DIAGNOSIS — Z0000 Encounter for general adult medical examination without abnormal findings: Secondary | ICD-10-CM | POA: Diagnosis not present

## 2018-11-02 DIAGNOSIS — I10 Essential (primary) hypertension: Secondary | ICD-10-CM

## 2018-11-02 NOTE — Progress Notes (Signed)
Subjective:   Carolyn Campbell is a 63 y.o. female who presents for Medicare Annual (Subsequent) preventive examination.  Review of Systems:   Cardiac Risk Factors include: advanced age (>53men, >4 women);dyslipidemia;hypertension     Objective:     Vitals: BP 132/72 (BP Location: Right Arm, Patient Position: Sitting, Cuff Size: Normal)   Pulse 81   Temp 98 F (36.7 C) (Oral)   Resp 16   Ht 5' (1.524 m)   Wt 137 lb 3.2 oz (62.2 kg)   SpO2 98%   BMI 26.80 kg/m   Body mass index is 26.8 kg/m.  Advanced Directives 11/02/2018 06/08/2017 04/15/2017 04/14/2017 03/18/2017 03/08/2017 01/17/2017  Does Patient Have a Medical Advance Directive? No No No No No No No  Would patient like information on creating a medical advance directive? Yes (MAU/Ambulatory/Procedural Areas - Information given) Yes (ED - Information included in AVS) No - Patient declined - - - -    Tobacco Social History   Tobacco Use  Smoking Status Never Smoker  Smokeless Tobacco Never Used     Counseling given: Not Answered   Clinical Intake:  Pre-visit preparation completed: Yes  Pain : 0-10 Pain Score: 5  Pain Type: Chronic pain Pain Location: Leg Pain Orientation: Left Pain Descriptors / Indicators: Aching Pain Onset: More than a month ago Pain Frequency: Constant     BMI - recorded: 26.8 Nutritional Status: BMI 25 -29 Overweight Nutritional Risks: None Diabetes: No  How often do you need to have someone help you when you read instructions, pamphlets, or other written materials from your doctor or pharmacy?: 1 - Never What is the last grade level you completed in school?: some college - certificate in medical administration  Interpreter Needed?: No  Information entered by :: Clemetine Marker LPN  Past Medical History:  Diagnosis Date  . Allergic rhinitis   . Anginal pain ([place])    states MD said it was GERD  . Arthritis   . Asthma   . Breast cancer ([place]) 2006   LT LUMPECTOMY  . CD (Crohn's  disease) ([facility]) 05/21/2015   FOLLOWED BY GI   . Clotting disorder ([place])   . Cognitive deficits as late effect of cerebrovascular disease   . COPD (chronic obstructive pulmonary disease) ([place] [facility])   . Crohn's disease of both small and large intestine with rectal bleeding ([place]) 12/04/2014  . Depression    Currently taking zoloft.  . 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 Dyspnea   . Esophageal reflux   . Fever blister 07/19/2018  . Glaucoma    vitreous degeneration  . History of kidney stones   . Hyperlipidemia   . Hypertension   . Hypothyroidism   . Occlusion, cerebral artery    NOS w/infarction  . Osteoporosis   . Personal history of radiation therapy 2006   BREAST CA  . Stroke ([place] [facility])   . Ulcer    Past Surgical History:  Procedure Laterality Date  . BREAST BIOPSY Left 2006   POS  . BREAST LUMPECTOMY Left 09/20/2004   positive  . BREAST SURGERY Left    malignant biopsy  . CARDIAC CATHETERIZATION    . COLONOSCOPY  2015  . COLONOSCOPY WITH PROPOFOL N/A 06/08/2017   Procedure: COLONOSCOPY WITH PROPOFOL;  Surgeon: Lin Landsman, MD;  Location: [facility];  Service: Gastroenterology;  Laterality: N/A;  . ESOPHAGOGASTRODUODENOSCOPY  2015  . ESOPHAGOGASTRODUODENOSCOPY (EGD) WITH PROPOFOL N/A 06/08/2017   Procedure: ESOPHAGOGASTRODUODENOSCOPY (EGD) WITH PROPOFOL;  Surgeon: Lin Landsman, MD;  Location: [facility];  Service: Gastroenterology;  Laterality: N/A;  . FINGER SURGERY     Right small finger  . FRACTURE SURGERY    . [place] STUDY  2015  . TUBAL LIGATION     Family History  Problem Relation Age of Onset  . Heart disease Mother   . Heart attack Mother   . Breast cancer Sister   . Alcohol abuse Father   . Cerebrovascular Accident Father   . Arthritis Father   . Hypertension Father   . Hypertension Other   . Diabetes Other   . Breast cancer Sister 17  . Heart attack Other 41  . Cancer Sister    Social History    Socioeconomic History  . Marital status: Married    Spouse name: Not on file  . Number of children: 2  . Years of education: Not on file  . Highest education level: Some college, no degree  Occupational History  . Not on file  Social Needs  . Financial resource strain: Not hard at all  . Food insecurity:    Worry: Never true    Inability: Never true  . Transportation needs:    Medical: No    Non-medical: No  Tobacco Use  . Smoking status: Never Smoker  . Smokeless tobacco: Never Used  Substance and Sexual Activity  . Alcohol use: No    Alcohol/week: 0.0 standard drinks  . Drug use: No  . Sexual activity: Yes    Partners: Male    Birth control/protection: None  Lifestyle  . Physical activity:    Days per week: 2 days    Minutes per session: 40 min  . Stress: Only a little  Relationships  . Social connections:    Talks on phone: More than three times a week    Gets together: Three times a week    Attends religious service: More than 4 times per year    Active member of club or organization: No    Attends meetings of clubs or organizations: Never    Relationship status: Married  Other Topics Concern  . Not on file  Social History Narrative   Married. One son and one daughter. She is disabled after she had breast cancer and strokes. 2 caffeinated beverages daily.    Outpatient Encounter Medications as of 11/02/2018  Medication Sig  . alendronate (FOSAMAX) 70 MG tablet TAKE 1 TABLET BY MOUTH ONCE A WEEK. TAKE WITH A FULL  GLASS OF WATER ON AN EMPTY  STOMACH.  Marland Kitchen atorvastatin (LIPITOR) 40 MG tablet Take 1 tablet (40 mg total) by mouth daily.  . diclofenac sodium (VOLTAREN) 1 % GEL Apply 2 g topically 4 (four) times daily.  Marland Kitchen gabapentin (NEURONTIN) 300 MG capsule Take 1 capsule (300 mg total) by mouth at bedtime.  . hydrochlorothiazide (HYDRODIURIL) 12.5 MG tablet Take 1 tablet (12.5 mg total) by mouth daily.  Marland Kitchen latanoprost (XALATAN) 0.005 % ophthalmic solution Place 1  drop into both eyes at bedtime.   Marland Kitchen levothyroxine (SYNTHROID, LEVOTHROID) 50 MCG tablet Take 1 tablet (50 mcg total) by mouth daily.  . Multiple Vitamin (MULTIVITAMIN) tablet Take 1 tablet by mouth daily.  . sertraline (ZOLOFT) 50 MG tablet Take 0.5 tablets (25 mg total) by mouth daily.  Marland Kitchen triamcinolone (KENALOG) 0.025 % ointment Apply 1 application topically 2 (two) times daily.  . valACYclovir (VALTREX) 1000 MG tablet Take 1 tablet (1,000 mg total) by mouth 2 (two) times  daily.  . mesalamine (LIALDA) 1.2 g EC tablet Take 1 tablet (1.2 g total) by mouth QID.   No facility-administered encounter medications on file as of 11/02/2018.     Activities of Daily Living In your present state of health, do you have any difficulty performing the following activities: 11/02/2018 07/19/2018  Hearing? N N  Comment declines hearing aids -  Vision? N Y  Comment wears glasses -  Difficulty concentrating or making decisions? N N  Walking or climbing stairs? N N  Dressing or bathing? N N  Doing errands, shopping? N N  Preparing Food and eating ? N -  Using the Toilet? N -  In the past six months, have you accidently leaked urine? N -  Do you have problems with loss of bowel control? N -  Managing your Medications? N -  Managing your Finances? N -  Housekeeping or managing your Housekeeping? N -  Some recent data might be hidden    Patient Care Team: Hubbard Hartshorn, FNP as PCP - General (Family Medicine) Marius Ditch, Tally Due, MD as Consulting Physician (Gastroenterology) Idelle Leech, [place] as Consulting Physician (Optometry) Beverly Gust, MD as Consulting Physician (Otolaryngology)    Assessment:   This is a routine wellness examination for Maricella.  Exercise Activities and Dietary recommendations Current Exercise Habits: Home exercise routine, Time (Minutes): 40, Frequency (Times/Week): 2, Weekly Exercise (Minutes/Week): 80, Intensity: Mild, Exercise limited by: cardiac condition(s);orthopedic  condition(s)  Goals    . Patient Stated     Patient states she would like to travel more. She wants to visit grandchildren in [place].        Fall Risk Fall Risk  11/02/2018 10/18/2018 07/19/2018 05/12/2018 04/11/2018  Falls in the past year? 0 0 Yes Yes Yes  Number falls in past yr: 0 0 1 1 1   Injury with Fall? 0 0 No No No  Follow up Falls prevention discussed Falls evaluation completed - - -   FALL RISK PREVENTION PERTAINING TO THE HOME:  Any stairs in or around the home? Yes  If so, are they are without handrails? Yes   Home free of loose throw rugs in walkways, pet beds, electrical cords, etc? Yes  Adequate lighting in your home to reduce risk of falls? Yes   ASSISTIVE DEVICES UTILIZED TO PREVENT FALLS:  Life alert? No  Use of a cane, walker or w/c? No  Grab bars in the bathroom? No  Shower chair or bench in shower? No  Elevated toilet seat or a handicapped toilet? No   DME ORDERS:  DME order needed?  No   TIMED UP AND GO:  Was the test performed? Yes .  Length of time to ambulate 10 feet: 7 sec.   GAIT:  Appearance of gait: Gait stead-fast and without the use of an assistive device.   Education: Fall risk prevention has been discussed.  Intervention(s) required? No    Depression Screen PHQ 2/9 Scores 11/02/2018 10/18/2018 07/19/2018 05/12/2018  PHQ - 2 Score 0 0 0 0  PHQ- 9 Score 3 0 0 0     Cognitive Function     6CIT Screen 11/02/2018 03/28/2018  What Year? 0 points 0 points  What month? 0 points 0 points  What time? 0 points 0 points  Count back from 20 0 points 0 points  Months in reverse 0 points 0 points  Repeat phrase 2 points 0 points  Total Score 2 0    Immunization History  Administered  Date(s) Administered  . Hepatitis A, Adult 06/22/2017  . Hepatitis B, adult 06/22/2017  . Influenza Whole 07/05/2012  . Influenza, Seasonal, Injecte, Preservative Fre 06/08/2011  . Influenza,inj,Quad PF,6+ Mos 07/31/2014, 07/07/2015, 06/15/2016,  06/22/2017, 06/23/2018  . Influenza-Unspecified 07/21/2013  . Pneumococcal Conjugate-13 06/22/2017  . Tdap 03/28/2012    Qualifies for Shingles Vaccine? Yes Due for Shingrix. Education has been provided regarding the importance of this vaccine. Pt has been advised to call insurance company to determine out of pocket expense. Advised may also receive vaccine at local pharmacy or Health Dept. Verbalized acceptance and understanding.  Tdap: Up to date  Flu Vaccine: Up to date  Pneumococcal Vaccine: Due for Pneumococcal vaccine. PPSV23 at age 53.   Screening Tests Health Maintenance  Topic Date Due  . HIV Screening  09/20/2019 (Originally 11/20/1970)  . MAMMOGRAM  08/09/2019  . PAP SMEAR-Modifier  12/20/2019  . COLONOSCOPY  06/08/2020  . TETANUS/TDAP  03/28/2022  . INFLUENZA VACCINE  Completed  . Hepatitis C Screening  Completed    Cancer Screenings:  Colorectal Screening: Completed 06/08/17. Repeat every 3 years;   Mammogram: Completed 08/08/18. Repeat every year.  Bone Density: Completed 06/02/17. Results reflect  OSTEOPENIA. Repeat every 2 years.   Lung Cancer Screening: (Low Dose CT Chest recommended if Age 51-80 years, 30 pack-year currently smoking OR have quit w/in 15years.) does not qualify.    Additional Screening:  Hepatitis C Screening: does qualify; Completed 10/31/18  Vision Screening: Recommended annual ophthalmology exams for early detection of glaucoma and other disorders of the eye. Is the patient up to date with their annual eye exam?  Yes  Who is the provider or what is the name of the office in which the pt attends annual eye exams? Dr. Matilde Sprang   Dental Screening: Recommended annual dental exams for proper oral hygiene  Community Resource Referral:  CRR required this visit?  No      Plan:    I have personally reviewed and addressed the Medicare Annual Wellness questionnaire and have noted the following in the patient's chart:  A. Medical and social  history B. Use of alcohol, tobacco or illicit drugs  C. Current medications and supplements D. Functional ability and status E.  Nutritional status F.  Physical activity G. Advance directives H. List of other physicians I.  Hospitalizations, surgeries, and ER visits in previous 12 months J.  [place] such as hearing and vision if needed, cognitive and depression L. Referrals and appointments   In addition, I have reviewed and discussed with patient certain preventive protocols, quality metrics, and best practice recommendations. A written personalized care plan for preventive services as well as general preventive health recommendations were provided to patient.   Signed,  Clemetine Marker, LPN Nurse Health Advisor   Nurse Notes: Pt c/o recent crohns flare up and has been seen by gastroenterology, see notes. She was also here today for BP check and doing well.

## 2018-11-02 NOTE — Progress Notes (Signed)
Pt is here today for a BP check.  Patient has stopped taking benazepril as directed by Raquel Sarna, NP.  BP today is 132/72, pulse is 81, patient denies any symptoms. Consulted with Raquel Sarna, NP and she advised that patient continue HCTZ 12.5 and notify us with any symptoms.

## 2018-11-02 NOTE — Patient Instructions (Signed)
Carolyn Campbell , Thank you for taking time to come for your Medicare Wellness Visit. I appreciate your ongoing commitment to your health goals. Please review the following plan we discussed and let me know if I can assist you in the future.   Screening recommendations/referrals: Colonoscopy: done 06/08/17. Repeat every 3 years.  Mammogram: done 08/08/18. Repeat every year.  Bone Density: done 06/02/17. Repeat Sept 2020. Recommended yearly ophthalmology/optometry visit for glaucoma screening and checkup Recommended yearly dental visit for hygiene and checkup  Vaccinations: Influenza vaccine: done 06/23/18 Pneumococcal vaccine: due at age 61 Tdap vaccine: done 03/24/12. Repeat in 2023. Shingles vaccine: Shingrix discussed. Please contact your pharmacy for coverage information.     Advanced directives: Advance directive discussed with you today. I have provided a copy for you to complete at home and have notarized. Once this is complete please bring a copy in to our office so we can scan it into your chart.  Conditions/risks identified: Recommend continuing physical activity and healthy eating to maintain health.   Next appointment: Please follow up in one year for your Medicare Annual Wellness visit.    Preventive Care 13 Years and Older, Female Preventive care refers to lifestyle choices and visits with your health care provider that can promote health and wellness. What does preventive care include?  A yearly physical exam. This is also called an annual well check.  Dental exams once or twice a year.  Routine eye exams. Ask your health care provider how often you should have your eyes checked.  Personal lifestyle choices, including:  Daily care of your teeth and gums.  Regular physical activity.  Eating a healthy diet.  Avoiding tobacco and drug use.  Limiting alcohol use.  Practicing safe sex.  Taking low-dose aspirin every day.  Taking vitamin and mineral supplements as  recommended by your health care provider. What happens during an annual well check? The services and screenings done by your health care provider during your annual well check will depend on your age, overall health, lifestyle risk factors, and family history of disease. Counseling  Your health care provider may ask you questions about your:  Alcohol use.  Tobacco use.  Drug use.  Emotional well-being.  Home and relationship well-being.  Sexual activity.  Eating habits.  History of falls.  Memory and ability to understand (cognition).  Work and work Statistician.  Reproductive health. Screening  You may have the following tests or measurements:  Height, weight, and BMI.  Blood pressure.  Lipid and cholesterol levels. These may be checked every 5 years, or more frequently if you are over 14 years old.  Skin check.  Lung cancer screening. You may have this screening every year starting at age 76 if you have a 30-pack-year history of smoking and currently smoke or have quit within the past 15 years.  Fecal occult blood test (FOBT) of the stool. You may have this test every year starting at age 68.  Flexible sigmoidoscopy or colonoscopy. You may have a sigmoidoscopy every 5 years or a colonoscopy every 10 years starting at age 61.  Hepatitis C blood test.  Hepatitis B blood test.  Sexually transmitted disease (STD) testing.  Diabetes screening. This is done by checking your blood sugar (glucose) after you have not eaten for a while (fasting). You may have this done every 1-3 years.  Bone density scan. This is done to screen for osteoporosis. You may have this done starting at age 11.  Mammogram. This may be  done every 1-2 years. Talk to your health care provider about how often you should have regular mammograms. Talk with your health care provider about your test results, treatment options, and if necessary, the need for more tests. Vaccines  Your health care  provider may recommend certain vaccines, such as:  Influenza vaccine. This is recommended every year.  Tetanus, diphtheria, and acellular pertussis (Tdap, Td) vaccine. You may need a Td booster every 10 years.  Zoster vaccine. You may need this after age 61.  Pneumococcal 13-valent conjugate (PCV13) vaccine. One dose is recommended after age 29.  Pneumococcal polysaccharide (PPSV23) vaccine. One dose is recommended after age 42. Talk to your health care provider about which screenings and vaccines you need and how often you need them. This information is not intended to replace advice given to you by your health care provider. Make sure you discuss any questions you have with your health care provider. Document Released: 10/03/2015 Document Revised: 05/26/2016 Document Reviewed: 07/08/2015 Elsevier Interactive Patient Education  2017 [place] Prevention in the Home Falls can cause injuries. They can happen to people of all ages. There are many things you can do to make your home safe and to help prevent falls. What can I do on the outside of my home?  Regularly fix the edges of walkways and driveways and fix any cracks.  Remove anything that might make you trip as you walk through a door, such as a raised step or threshold.  Trim any bushes or trees on the path to your home.  Use bright outdoor lighting.  Clear any walking paths of anything that might make someone trip, such as rocks or tools.  Regularly check to see if handrails are loose or broken. Make sure that both sides of any steps have handrails.  Any raised decks and porches should have guardrails on the edges.  Have any leaves, snow, or ice cleared regularly.  Use sand or salt on walking paths during winter.  Clean up any spills in your garage right away. This includes oil or grease spills. What can I do in the bathroom?  Use night lights.  Install grab bars by the toilet and in the tub and shower. Do  not use towel bars as grab bars.  Use non-skid mats or decals in the tub or shower.  If you need to sit down in the shower, use a plastic, non-slip stool.  Keep the floor dry. Clean up any water that spills on the floor as soon as it happens.  Remove soap buildup in the tub or shower regularly.  Attach bath mats securely with double-sided non-slip rug tape.  Do not have throw rugs and other things on the floor that can make you trip. What can I do in the bedroom?  Use night lights.  Make sure that you have a light by your bed that is easy to reach.  Do not use any sheets or blankets that are too big for your bed. They should not hang down onto the floor.  Have a firm chair that has side arms. You can use this for support while you get dressed.  Do not have throw rugs and other things on the floor that can make you trip. What can I do in the kitchen?  Clean up any spills right away.  Avoid walking on wet floors.  Keep items that you use a lot in easy-to-reach places.  If you need to reach something above you, use  a strong step stool that has a grab bar.  Keep electrical cords out of the way.  Do not use floor polish or wax that makes floors slippery. If you must use wax, use non-skid floor wax.  Do not have throw rugs and other things on the floor that can make you trip. What can I do with my stairs?  Do not leave any items on the stairs.  Make sure that there are handrails on both sides of the stairs and use them. Fix handrails that are broken or loose. Make sure that handrails are as long as the stairways.  Check any carpeting to make sure that it is firmly attached to the stairs. Fix any carpet that is loose or worn.  Avoid having throw rugs at the top or bottom of the stairs. If you do have throw rugs, attach them to the floor with carpet tape.  Make sure that you have a light switch at the top of the stairs and the bottom of the stairs. If you do not have them,  ask someone to add them for you. What else can I do to help prevent falls?  Wear shoes that:  Do not have high heels.  Have rubber bottoms.  Are comfortable and fit you well.  Are closed at the toe. Do not wear sandals.  If you use a stepladder:  Make sure that it is fully opened. Do not climb a closed stepladder.  Make sure that both sides of the stepladder are locked into place.  Ask someone to hold it for you, if possible.  Clearly mark and make sure that you can see:  Any grab bars or handrails.  First and last steps.  Where the edge of each step is.  Use tools that help you move around (mobility aids) if they are needed. These include:  Canes.  Walkers.  Scooters.  Crutches.  Turn on the lights when you go into a dark area. Replace any light bulbs as soon as they burn out.  Set up your furniture so you have a clear path. Avoid moving your furniture around.  If any of your floors are uneven, fix them.  If there are any pets around you, be aware of where they are.  Review your medicines with your doctor. Some medicines can make you feel dizzy. This can increase your chance of falling. Ask your doctor what other things that you can do to help prevent falls. This information is not intended to replace advice given to you by your health care provider. Make sure you discuss any questions you have with your health care provider. Document Released: 07/03/2009 Document Revised: 02/12/2016 Document Reviewed: 10/11/2014 Elsevier Interactive Patient Education  2017 [employer].

## 2018-11-03 LAB — QUANTIFERON-TB GOLD PLUS
QuantiFERON Mitogen Value: >10 IU/mL
QuantiFERON Nil Value: 0.05 IU/mL
QuantiFERON TB1 Ag Value: 0.05 IU/mL
QuantiFERON TB2 Ag Value: 0.05 IU/mL
QuantiFERON-TB Gold Plus: NEGATIVE

## 2018-11-04 LAB — THIOPURINE METHYLTRANSFERASE (TPMT), RBC: TPMT Activity:: 19.5 Units/mL RBC

## 2018-11-06 ENCOUNTER — Ambulatory Visit
Admission: RE | Admit: 2018-11-06 | Discharge: 2018-11-06 | Disposition: A | Discharge status: Home / Self Care | Payer: Medicare Other | Source: Ambulatory Visit | Attending: Gastroenterology | Admitting: Gastroenterology

## 2018-11-06 ENCOUNTER — Ambulatory Visit (INDEPENDENT_AMBULATORY_CARE_PROVIDER_SITE_OTHER): Payer: Medicare Other | Admitting: Gastroenterology

## 2018-11-06 ENCOUNTER — Other Ambulatory Visit: Payer: Self-pay | Admitting: Gastroenterology

## 2018-11-06 ENCOUNTER — Encounter: Payer: Self-pay | Admitting: Gastroenterology

## 2018-11-06 VITALS — BP 128/75 | HR 76 | Resp 17 | Ht 60.0 in | Wt 134.2 lb

## 2018-11-06 DIAGNOSIS — K5090 Crohn's disease, unspecified, without complications: Secondary | ICD-10-CM | POA: Insufficient documentation

## 2018-11-06 DIAGNOSIS — K5080 Crohn's disease of both small and large intestine without complications: Secondary | ICD-10-CM | POA: Diagnosis not present

## 2018-11-06 DIAGNOSIS — R634 Abnormal weight loss: Secondary | ICD-10-CM | POA: Insufficient documentation

## 2018-11-06 MED ORDER — PROMETHAZINE HCL 12.5 MG PO TABS: 12.5000 mg | ORAL_TABLET | Freq: Once | ORAL | Status: DC

## 2018-11-06 MED ORDER — ONDANSETRON HCL 4 MG PO TABS: 4.0000 mg | ORAL_TABLET | Freq: Three times a day (TID) | ORAL | 0 refills | Status: DC | PRN

## 2018-11-06 MED ORDER — ONDANSETRON HCL 4 MG PO TABS
4.0000 mg | ORAL_TABLET | Freq: Once | ORAL | Status: DC | PRN
  Filled 2018-11-06: qty 1

## 2018-11-06 MED ORDER — AZATHIOPRINE 50 MG PO TABS: 50.0000 mg | ORAL_TABLET | Freq: Every day | ORAL | 0 refills | Status: AC

## 2018-11-06 MED ORDER — IOPAMIDOL (ISOVUE-300) INJECTION 61%
100.0000 mL | Freq: Once | INTRAVENOUS | Status: AC | PRN
  Administered 2018-11-06: 100 mL via INTRAVENOUS

## 2018-11-06 MED ORDER — PREDNISONE 10 MG PO TABS: 40.0000 mg | ORAL_TABLET | Freq: Every day | ORAL | 0 refills | Status: AC

## 2018-11-06 MED ORDER — ONDANSETRON HCL 4 MG PO TABS: 4.0000 mg | ORAL_TABLET | Freq: Once | ORAL | Status: DC

## 2018-11-06 MED ORDER — PROMETHAZINE HCL 25 MG PO TABS
12.5000 mg | ORAL_TABLET | Freq: Once | ORAL | Status: AC
  Administered 2018-11-06: 12.5 mg via ORAL
  Filled 2018-11-06: qty 1

## 2018-11-06 NOTE — Addendum Note (Signed)
Addended by: [name] on: 11/06/2018 02:45 PM   Modules accepted: Orders

## 2018-11-06 NOTE — Progress Notes (Signed)
Carolyn Darby, MD [address]  [place]  [place], [place] [address]  Main: [phone]  Fax: [phone]    Gastroenterology Consultation  Referring Provider:     Hubbard Hartshorn, FNP Primary Care Physician:  Hubbard Hartshorn, FNP Primary Gastroenterologist:  Dr. Cephas Campbell Reason for Consultation: Crohn's Disease        HPI:   Carolyn Campbell is a 63 y.o. African-American female referred by Dr. Uvaldo Rising, Astrid Divine, FNP  for consultation & management of Crohn's disease. This was diagnosed in 2015, currently on Imuran 50 mg daily. She reports intermittent flareups of her Crohn's disease such as abdominal pain, bloating, rectal bleeding. She received prednisone for flare up. She had 2 flareups within last 1 year. She recently is suffering with constipation, had to take laxatives to have a BM. Her last BM was 3 days ago. She does report some upper abdominal bloating. She denies any weight loss, rectal bleeding, diarrhea, nausea, vomiting. She is not on any prednisone currently. She denies taking NSAIDs.  Follow up 06/21/2017: She underwent EGD and colonoscopy. EGD was normal, colonoscopy revealed normal mucosa but the biopsies showed mild active chronic ileitis and right-sided colitis. No granulomas were seen Doing well overall. Having BM every other day, denies feeling constipated. She does have mild abdominal discomfort after a BM which lasts for about a minute. She continues to take Imuran 50 mg daily. She is trying to lose weight  Follow-up visit 01/17/2018 Patient had an episode of flare up in 09/2017 when she developed right lower quadrant discomfort associated with constipation, pain persisted after relief of constipation which lasted for about 2 weeks. She did not call my office and on the time. She did not take any prednisone, no ER visits. She is currently on Lialda 1.2 g twice daily. She denies having ongoing symptoms. She reports having regular bowel movements and her weight  has been stable. Her most recent blood work revealed mild normocytic anemia.  Follow up visit 02/16/2018 She denies any complaints today. She is currently on Lialda 1.2 g 4 pills daily. She was briefly on 6 pills per day when she had worsened symptoms about 3 weeks ago. Her CRP is improving. Her iron levels are normal, however she is mildly anemic. She is currently taking oral iron daily. She now has insurance from the [place] and requests future prescriptions to be sent to the [facility] so that she doesn't need to pay any co-pay.  Follow-up visit 05/23/18 She reports right-sided headache radiating to the right ear and right sided chest. Also, feels like a knot in the epigastric region. She denies any diarrhea or abdominal pain. She is tolerating Lialda 4.8 g daily. Her weight is stable  Follow up visit 10/24/18 She thinks she has flare up of crohn's as she has mild abdominal discomfort that limited her PO intake. She continues to have atypical chest discomfort for which she is taking tums. She mentioned about chest pain during last visit, I urged her to see her PCP which she did not yet  Follow-up visit 11/06/2018 Since last visit, her symptoms worsened.  Therefore, patient made an urgent visit to see me today.  She reports upper abdominal pain limiting her p.o. intake, results in nausea, describes it as burning.  She is taking Tums and Prilosec as needed which provides some relief. She denies diarrhea.  She lost about 6 pounds in last 3 weeks.  She denies fever, chills.  She is  accompanied by her husband today.  I was planning to start her on immunomodulator, she underwent screening for TB and hepatitis B, which were negative.  TPMT levels were normal.  Crohn's disease classification:  Age: > 40 Location: ileocolonic  Behavior: non stricturing, non penetrating  Perianal: no  IBD diagnosis: 11/2013  Disease course:Crohn's disease in 2015, initial symptoms were abdominal pain, blood in stool and on  wiping, bloating. She had a colonoscopy, VCE, EGD at the time of diagnosis. She was initially on mesalamine 4 pills daily, prednisone later switched to Remicade in 11/2014. She took only 5 doses as she could not afford. Then she was switched to Imuran 50 mg daily. She had mild flareups about twice a year. Colonoscopy 05/2017 revealed mild ileocolonic disease primarily on histology. She self discontinued Imuran. Lialda started in 06/2017  Extra intestinal manifestations: None  IBD surgical history: None No family history of IBD Imaging:  MRE none CTE none SBFT none  Procedures:  Colonoscopy : 01/28/2005, showed external hemorrhoids only Colonoscopy 12/10/2013, showed normal terminal ileum, biopsies performed. Skipped areas of nonbleeding ulcerative mucosa were seen in the entire colon, biopsies performed. 4 mm polyp in the sigmoid colon Pathology: Rectal biopsy mild-to-moderate active proctitis with architectural features of chronicity, negative for dysplasia and malignancy. Terminal ileum: Small bowel mucosa with preserved villous architecture. Right colon biopsy: No significant pathologic changes  Upper Endoscopy 12/10/2013, underwent dilation of the esophagus Ampullary biopsy: Normal, gastric biopsy: Intramucosal with erosion and mild chronic gastritis, negative for H. pylori, dysplasia and malignancy  VCE 05/06/2014: Erosions in the distal ileum  EGD 06/08/2017 Normal esophagus, small hiatal hernia, normal stomach and duodenum  Colonoscopy 06/08/2017 The perianal and digital rectal examinations were normal. Pertinent negatives include normal sphincter tone and no palpable rectal Lesions. The terminal ileum appeared normal. Biopsies were taken with a cold forceps for histology. Two scattered non-bleeding aphthae were found in the ascending colon. Rest of the colon and rectum appeared normal. Random biopsies performed   IBD medications:  Steroids: Prednisone, budesonide 5-ASA:  mesalamine, Lialda Immunomodulators: stopped azathioprine  TPMT status unknown Biologics:  Anti TNFs: Remicade monotherapy, received induction followed by 1 maintenance dose in 11/2014 Anti Integrins: Ustekinumab: Tofactinib: Clinical trial:   Past Medical History:  Diagnosis Date  . Allergic rhinitis   . Anginal pain ([place])    states MD said it was GERD  . Arthritis   . Asthma   . Breast cancer ([facility]) 2006   LT LUMPECTOMY  . CD (Crohn's disease) ([facility]) 05/21/2015   FOLLOWED BY GI   . Clotting disorder ([place])   . Cognitive deficits as late effect of cerebrovascular disease   . COPD (chronic obstructive pulmonary disease) ([facility])   . Crohn's disease of both small and large intestine with rectal bleeding ([facility]) 12/04/2014  . Depression    Currently taking zoloft.  . 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 Dyspnea   . Esophageal reflux   . Fever blister 07/19/2018  . Glaucoma    vitreous degeneration  . History of kidney stones   . Hyperlipidemia   . Hypertension   . Hypothyroidism   . Occlusion, cerebral artery    NOS w/infarction  . Osteoporosis   . Personal history of radiation therapy 2006   BREAST CA  . Stroke ([facility])   . Ulcer     Past Surgical History:  Procedure Laterality Date  . BREAST BIOPSY Left 2006   POS  . BREAST LUMPECTOMY Left 09/20/2004  positive  . BREAST SURGERY Left    malignant biopsy  . CARDIAC CATHETERIZATION    . COLONOSCOPY  2015  . COLONOSCOPY WITH PROPOFOL N/A 06/08/2017   Procedure: COLONOSCOPY WITH PROPOFOL;  Surgeon: Lin Landsman, MD;  Location: [facility];  Service: Gastroenterology;  Laterality: N/A;  . ESOPHAGOGASTRODUODENOSCOPY  2015  . ESOPHAGOGASTRODUODENOSCOPY (EGD) WITH PROPOFOL N/A 06/08/2017   Procedure: ESOPHAGOGASTRODUODENOSCOPY (EGD) WITH PROPOFOL;  Surgeon: Lin Landsman, MD;  Location: [facility];  Service: Gastroenterology;  Laterality: N/A;  . FINGER SURGERY     Right small  finger  . FRACTURE SURGERY    . [place] STUDY  2015  . TUBAL LIGATION       Current Outpatient Medications:  .  alendronate (FOSAMAX) 70 MG tablet, TAKE 1 TABLET BY MOUTH ONCE A WEEK. TAKE WITH A FULL  GLASS OF WATER ON AN EMPTY  STOMACH., Disp: 12 tablet, Rfl: 3 .  atorvastatin (LIPITOR) 40 MG tablet, Take 1 tablet (40 mg total) by mouth daily., Disp: 90 tablet, Rfl: 0 .  azaTHIOprine (IMURAN) 50 MG tablet, Take 1 tablet (50 mg total) by mouth daily., Disp: 90 tablet, Rfl: 0 .  diclofenac sodium (VOLTAREN) 1 % GEL, Apply 2 g topically 4 (four) times daily., Disp: 100 g, Rfl: 0 .  gabapentin (NEURONTIN) 300 MG capsule, Take 1 capsule (300 mg total) by mouth at bedtime., Disp: 90 capsule, Rfl: 0 .  hydrochlorothiazide (HYDRODIURIL) 12.5 MG tablet, Take 1 tablet (12.5 mg total) by mouth daily., Disp: 90 tablet, Rfl: 1 .  latanoprost (XALATAN) 0.005 % ophthalmic solution, Place 1 drop into both eyes at bedtime. , Disp: , Rfl:  .  levothyroxine (SYNTHROID, LEVOTHROID) 50 MCG tablet, Take 1 tablet (50 mcg total) by mouth daily., Disp: 90 tablet, Rfl: 0 .  Multiple Vitamin (MULTIVITAMIN) tablet, Take 1 tablet by mouth daily., Disp: , Rfl:  .  sertraline (ZOLOFT) 50 MG tablet, Take 0.5 tablets (25 mg total) by mouth daily., Disp: 90 tablet, Rfl: 0 .  triamcinolone (KENALOG) 0.025 % ointment, Apply 1 application topically 2 (two) times daily., Disp: 30 g, Rfl: 0 .  valACYclovir (VALTREX) 1000 MG tablet, Take 1 tablet (1,000 mg total) by mouth 2 (two) times daily., Disp: 30 tablet, Rfl: 5 .  mesalamine (LIALDA) 1.2 g EC tablet, Take 1 tablet (1.2 g total) by mouth QID., Disp: 360 tablet, Rfl: 0 .  ondansetron (ZOFRAN) 4 MG tablet, Take 1 tablet (4 mg total) by mouth every 8 (eight) hours as needed for up to 14 doses for nausea or vomiting., Disp: 14 tablet, Rfl: 0 .  predniSONE (DELTASONE) 10 MG tablet, Take 4 tablets (40 mg total) by mouth daily with breakfast for 30 days., Disp: 120 tablet,  Rfl: 0   Family History  Problem Relation Age of Onset  . Heart disease Mother   . Heart attack Mother   . Breast cancer Sister   . Alcohol abuse Father   . Cerebrovascular Accident Father   . Arthritis Father   . Hypertension Father   . Hypertension Other   . Diabetes Other   . Breast cancer Sister 98  . Heart attack Other 41  . Cancer Sister      Social History   Tobacco Use  . Smoking status: Never Smoker  . Smokeless tobacco: Never Used  Substance Use Topics  . Alcohol use: No    Alcohol/week: 0.0 standard drinks  . Drug use: No    Allergies as  of 11/06/2018  . (No Known Allergies)    Review of Systems:    All systems reviewed and negative except where noted in HPI.   Physical Exam:  BP 128/75 (BP Location: Left Arm, Patient Position: Sitting, Cuff Size: Normal)   Pulse 76   Resp 17   Ht 5' (1.524 m)   Wt 134 lb 3.2 oz (60.9 kg)   BMI 26.21 kg/m  No LMP recorded. Patient is postmenopausal.  General:   Alert,  Well-developed, well-nourished, pleasant and cooperative in NAD Head:  Normocephalic and atraumatic. Eyes:  Sclera clear, no icterus.   Conjunctiva pink. Ears:  Normal auditory acuity. Nose:  No deformity, discharge, or lesions. Mouth:  No deformity or lesions,oropharynx pink & moist. Neck:  Supple; no masses or thyromegaly. Lungs:  Respirations even and unlabored.  Clear throughout to auscultation.   No wheezes, crackles, or rhonchi. No acute distress. Heart:  Regular rate and rhythm; no murmurs, clicks, rubs, or gallops. Abdomen:  Normal bowel sounds.  No bruits.  Soft, mild to moderate tenderness in upper abdomen, and non-distended without masses, hepatosplenomegaly or hernias noted.  No guarding or rebound tenderness.   Rectal: Nor performed Msk:  Symmetrical without gross deformities. Good, equal movement & strength bilaterally. Pulses:  Normal pulses noted. Extremities:  No clubbing or edema.  No cyanosis. Neurologic:  Alert and oriented x3;   grossly normal neurologically. Skin:  Intact without significant lesions or rashes. No jaundice. Psych:  Alert and cooperative. Normal mood and affect.  Imaging Studies: MRI reviewed  Assessment and Plan:   DENETTE HASS is a 63 y.o. African-American female with Terminal ileal and colonic Crohn's, diagnosed in 2015 who was previously maintained on Imuran 50 mg daily with mild intermittent flareups about twice a year needing prednisone. She does not have any endoscopy evidence of ileocolonic Crohn's disease other than a few scattered aphthae in the ascending colon on colonoscopy in 05/2017 but histology revealed chronic mild active ileitis and right-sided colitis. MR enterography did not inflammation in small bowel. I tried to give her budesonide but she could not afford high co-pay of $500 for 30 days supply.  She stopped Imuran by herself. Currently, she is on on Lialda 2.4 g twice a day, here with worsening of upper abdominal pain associated with nausea, unintentional weight loss, poor p.o. intake.  She probably has exacerbation of underlying Crohn's. Fecal calprotectin levels are elevated  -Recommend CT abdomen and pelvis with contrast -Start prednisone 40 mg daily -Zofran for nausea I have also discussed with her about colonoscopy after the CT scan results  IBD Health Maintenance  1.TB status: Gold quantiferon negative 10/2018 2. Anemia: normal Hb, vitamin B12 and ferritin levels are normal 3.Immunizations: Hep A immune, hepatitis B not immune, received 1 dose in 06/2017, Influenza vaccine received in 06/2017, prevnar  Received in 06/2017, pneumovax not received, recommend 4.Cancer screening I) Colon cancer/dysplasia surveillance: Colonoscopy 05/2017, no evidence of dysplasia and no polyps identified. Repeat colonoscopy in 5 years or sooner if she develops any flare up of symptoms of Crohn's disease II) Cervical cancer: n/a III) Skin cancer - n/a  5.Bone health Vitamin D status:  Normal Bone density testing: Not done 5. Labs: today 6. Smoking: Never smoked 7. NSAIDs and Antibiotics use: Denies NSAID use and antibiotic use Advised her to discuss about unilateral headaches with her primary care doctor 8. Atypical chest pain, follow up with her PCP  Follow up in 2 weeks   Carolyn Darby, MD

## 2018-11-07 ENCOUNTER — Telehealth: Payer: Self-pay | Admitting: Gastroenterology

## 2018-11-07 ENCOUNTER — Other Ambulatory Visit: Payer: Self-pay

## 2018-11-07 ENCOUNTER — Telehealth: Payer: Self-pay | Admitting: Family Medicine

## 2018-11-07 DIAGNOSIS — Z1211 Encounter for screening for malignant neoplasm of colon: Secondary | ICD-10-CM

## 2018-11-07 DIAGNOSIS — R9389 Abnormal findings on diagnostic imaging of other specified body structures: Secondary | ICD-10-CM

## 2018-11-07 NOTE — Telephone Encounter (Signed)
CT results reviewed with patient, possible cystitis No acute GI/liver/pancreas/biliary pathology  She started taking prednisone, feels little better today Able to eat  Check UA, she will drop off urine sample tomorrow Recommend colonoscopy to evaluate for flareup of Crohn's disease  Patient expressed understanding of the plan  [name], MD [address]  [place]  [place], [place] [address]  Main: [phone]  Fax: [phone] Pager: [phone]

## 2018-11-07 NOTE — Telephone Encounter (Signed)
Patient notified. Will come tomorrow

## 2018-11-07 NOTE — Telephone Encounter (Signed)
-----   Message from Lin Landsman, MD sent at 11/07/2018  2:56 PM EST ----- CT results reviewed with patient, possible cystitis No acute GI/liver/pancreas/biliary pathology  Check UA Recommend colonoscopy  Rohini Vanga

## 2018-11-07 NOTE — Telephone Encounter (Signed)
Please call Carolyn Campbell - her CT scan with Dr. Marius Ditch showed possible UTI.  She needs to come in for a nurse visit for Urine culture.

## 2018-11-08 ENCOUNTER — Other Ambulatory Visit: Payer: Self-pay | Admitting: Emergency Medicine

## 2018-11-08 ENCOUNTER — Ambulatory Visit (INDEPENDENT_AMBULATORY_CARE_PROVIDER_SITE_OTHER): Payer: Medicare Other | Admitting: Family Medicine

## 2018-11-08 ENCOUNTER — Encounter: Payer: Self-pay | Admitting: Family Medicine

## 2018-11-08 ENCOUNTER — Other Ambulatory Visit: Payer: Self-pay | Admitting: Family Medicine

## 2018-11-08 VITALS — BP 142/68 | HR 90 | Temp 97.6°F | Resp 14 | Ht 60.0 in | Wt 135.0 lb

## 2018-11-08 DIAGNOSIS — R34 Anuria and oliguria: Secondary | ICD-10-CM | POA: Diagnosis not present

## 2018-11-08 DIAGNOSIS — N3000 Acute cystitis without hematuria: Secondary | ICD-10-CM

## 2018-11-08 DIAGNOSIS — R9389 Abnormal findings on diagnostic imaging of other specified body structures: Secondary | ICD-10-CM

## 2018-11-08 DIAGNOSIS — R8281 Pyuria: Secondary | ICD-10-CM | POA: Diagnosis not present

## 2018-11-08 DIAGNOSIS — R5383 Other fatigue: Secondary | ICD-10-CM

## 2018-11-08 LAB — CBC
HCT: 37.4 % (ref 35.0–45.0)
Hemoglobin: 12.6 g/dL (ref 11.7–15.5)
MCH: 29.2 pg (ref 27.0–33.0)
MCHC: 33.7 g/dL (ref 32.0–36.0)
MCV: 86.6 fL (ref 80.0–100.0)
MPV: 11.4 fL (ref 7.5–12.5)
Platelets: 235 10*3/uL (ref 140–400)
RBC: 4.32 10*6/uL (ref 3.80–5.10)
RDW: 12.9 % (ref 11.0–15.0)
WBC: 5.8 10*3/uL (ref 3.8–10.8)

## 2018-11-08 LAB — POCT URINALYSIS DIPSTICK
Bilirubin, UA: NEGATIVE
Blood, UA: NEGATIVE
Glucose, UA: NEGATIVE
Ketones, UA: NEGATIVE
Nitrite, UA: NEGATIVE
Protein, UA: POSITIVE — AB
Spec Grav, UA: 1.01 (ref 1.010–1.025)
Urobilinogen, UA: NEGATIVE E.U./dL — AB
pH, UA: 7.5 (ref 5.0–8.0)

## 2018-11-08 LAB — BASIC METABOLIC PANEL WITH GFR
BUN: 14 mg/dL (ref 7–25)
CO2: 33 mmol/L — ABNORMAL HIGH (ref 20–32)
Calcium: 10.2 mg/dL (ref 8.6–10.4)
Chloride: 100 mmol/L (ref 98–110)
Creat: 0.88 mg/dL (ref 0.50–0.99)
GFR, Est African American: 80 mL/min/{1.73_m2} (ref >=60)
GFR, Est Non African American: 69 mL/min/{1.73_m2} (ref >=60)
Glucose, Bld: 108 mg/dL — ABNORMAL HIGH (ref 65–99)
Potassium: 3.4 mmol/L — ABNORMAL LOW (ref 3.5–5.3)
Sodium: 140 mmol/L (ref 135–146)

## 2018-11-08 MED ORDER — SULFAMETHOXAZOLE-TRIMETHOPRIM 800-160 MG PO TABS: 1.0000 | ORAL_TABLET | Freq: Two times a day (BID) | ORAL | 0 refills | Status: AC

## 2018-11-08 MED ORDER — LIDOCAINE HCL (PF) 1 % IJ SOLN
1.0000 mL | Freq: Once | INTRAMUSCULAR | Status: AC
  Administered 2018-11-08: 1 mL via INTRADERMAL

## 2018-11-08 MED ORDER — CEFTRIAXONE SODIUM 500 MG IJ SOLR
500.0000 mg | Freq: Once | INTRAMUSCULAR | Status: AC
  Administered 2018-11-08: 500 mg via INTRAMUSCULAR

## 2018-11-08 NOTE — Progress Notes (Signed)
Name: Carolyn Campbell   MRN: [id number]    DOB: 06/30/1956   Date:11/09/2018       Progress Note  Subjective  Chief Complaint  Chief Complaint  Patient presents with  . Abdominal Pain    no appetite,pain on left side had CT on yesterday  . Urinary Tract Infection    HPI  Pt presents for eval for UTI - she was evaluated by Dr. Marius Ditch and had CT scan performed on 11/06/2018, and Dr. Marius Ditch sent myself a note stating possible cystitis was found, UA recommended. When patient arrived today she was feeling quite fatigued, so she was switched to an OV for further evaluation.  She endorses fatigue for about 5 days, with urinary hesitancy, and upper abdominal pain. Last kidney function was 10/24/2018 and was WNL.  No history of kidney stones. No NVD, no hematuria, or dysuria, flank pain, no urinary frequency or urgency. CT scan from 11/06/2018 shows "Slightly thick-walled appearance of the bladder which may be secondary to under distension or a mild cystitis".    Patient Active Problem List   Diagnosis Date Noted  . Vitamin D deficiency 07/19/2018  . Varicose veins of both lower extremities with pain 07/19/2018  . Arthritis of right hand 12/21/2017  . Immunosuppressed status ([place]) 12/21/2017  . Major depression in remission ([place]) 12/21/2017  . Fibrocystic breast changes 06/22/2017  . Dysphagia as late effect of stroke 03/20/2017  . Osteopenia 10/18/2016  . Atherosclerosis of aorta ([place]) 12/19/2015  . Vitreous degeneration 05/21/2015  . Bad memory 05/21/2015  . Glaucoma associated with chamber angle anomalies 05/21/2015  . Dermatitis, eczematoid 05/21/2015  . CD (Crohn's disease) ([place]) 05/21/2015  . H/O malignant neoplasm of breast 05/21/2015  . Crohn's disease of both small and large intestine with rectal bleeding ([place]) 12/04/2014  . Solitary pulmonary nodule 11/07/2012  . 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 Cognitive deficits as late effect of cerebrovascular  disease 01/20/2010  . CVA, old, hemiparesis ([place]) 04/28/2009  . Adult hypothyroidism 06/12/2008  . Acid reflux 05/23/2007  . Hypercholesterolemia without hypertriglyceridemia 01/18/2007  . Benign essential HTN 01/18/2007    Social History   Tobacco Use  . Smoking status: Never Smoker  . Smokeless tobacco: Never Used  Substance Use Topics  . Alcohol use: No    Alcohol/week: 0.0 standard drinks     Current Outpatient Medications:  .  alendronate (FOSAMAX) 70 MG tablet, TAKE 1 TABLET BY MOUTH ONCE A WEEK. TAKE WITH A FULL  GLASS OF WATER ON AN EMPTY  STOMACH., Disp: 12 tablet, Rfl: 3 .  atorvastatin (LIPITOR) 40 MG tablet, Take 1 tablet (40 mg total) by mouth daily., Disp: 90 tablet, Rfl: 0 .  azaTHIOprine (IMURAN) 50 MG tablet, Take 1 tablet (50 mg total) by mouth daily., Disp: 90 tablet, Rfl: 0 .  diclofenac sodium (VOLTAREN) 1 % GEL, Apply 2 g topically 4 (four) times daily., Disp: 100 g, Rfl: 0 .  gabapentin (NEURONTIN) 300 MG capsule, Take 1 capsule (300 mg total) by mouth at bedtime., Disp: 90 capsule, Rfl: 0 .  hydrochlorothiazide (HYDRODIURIL) 12.5 MG tablet, Take 1 tablet (12.5 mg total) by mouth daily., Disp: 90 tablet, Rfl: 1 .  latanoprost (XALATAN) 0.005 % ophthalmic solution, Place 1 drop into both eyes at bedtime. , Disp: , Rfl:  .  levothyroxine (SYNTHROID, LEVOTHROID) 50 MCG tablet, Take 1 tablet (50 mcg total) by mouth daily., Disp: 90 tablet, Rfl: 0 .  Multiple Vitamin (MULTIVITAMIN) tablet,  Take 1 tablet by mouth daily., Disp: , Rfl:  .  ondansetron (ZOFRAN) 4 MG tablet, Take 1 tablet (4 mg total) by mouth every 8 (eight) hours as needed for up to 14 doses for nausea or vomiting., Disp: 14 tablet, Rfl: 0 .  predniSONE (DELTASONE) 10 MG tablet, Take 4 tablets (40 mg total) by mouth daily with breakfast for 30 days., Disp: 120 tablet, Rfl: 0 .  sertraline (ZOLOFT) 50 MG tablet, Take 0.5 tablets (25 mg total) by mouth daily., Disp: 90 tablet, Rfl: 0 .  triamcinolone  (KENALOG) 0.025 % ointment, Apply 1 application topically 2 (two) times daily., Disp: 30 g, Rfl: 0 .  valACYclovir (VALTREX) 1000 MG tablet, Take 1 tablet (1,000 mg total) by mouth 2 (two) times daily., Disp: 30 tablet, Rfl: 5 .  mesalamine (LIALDA) 1.2 g EC tablet, Take 1 tablet (1.2 g total) by mouth QID., Disp: 360 tablet, Rfl: 0 .  sulfamethoxazole-trimethoprim (BACTRIM DS,SEPTRA DS) 800-160 MG tablet, Take 1 tablet by mouth 2 (two) times daily for 5 days., Disp: 10 tablet, Rfl: 0  Current Facility-Administered Medications:  .  ondansetron (ZOFRAN) tablet 4 mg, 4 mg, Oral, Once, Vanga, Tally Due, MD .  promethazine (PHENERGAN) tablet 12.5 mg, 12.5 mg, Oral, Once, Vanga, Tally Due, MD  No Known Allergies  I personally reviewed active problem list, medication list, allergies, notes from last encounter, lab results with the patient/caregiver today.  ROS  Ten systems reviewed and is negative except as mentioned in HPI.  Objective  Vitals:   11/08/18 1446 11/08/18 1513  BP: (!) 172/76 (!) 142/68  Pulse: 90   Resp: 14   Temp: 97.6 F (36.4 C)   TempSrc: Oral   SpO2: 94%   Weight: 135 lb (61.2 kg)   Height: 5' (1.524 m)    Body mass index is 26.37 kg/m.  Nursing Note and Vital Signs reviewed.  Physical Exam  Constitutional: Patient appears well-developed and well-nourished. No distress.  HENT: Head: Normocephalic and atraumatic. Ears: bilateral TMs with no erythema or effusion; Nose: Nose normal. Mouth/Throat: Oropharynx is clear and moist. No oropharyngeal exudate or tonsillar swelling.  Eyes: Conjunctivae and EOM are normal. No scleral icterus.  Pupils are equal, round, and reactive to light.  Neck: Normal range of motion. Neck supple. No JVD present. No thyromegaly present.  Cardiovascular: Normal rate, regular rhythm and normal heart sounds.  No murmur heard. No BLE edema. Pulmonary/Chest: Effort normal and breath sounds normal. No respiratory distress. Abdominal:  Soft. Bowel sounds are normal, no distension. There is no tenderness. No masses. No CVA tenderness Musculoskeletal: Normal range of motion, no joint effusions. No gross deformities Neurological: Pt is alert and oriented to person, place, and time. No cranial nerve deficit. Coordination, balance, strength, speech and gait are normal.  Skin: Skin is warm and dry. No rash noted. No erythema.  Psychiatric: Patient has a normal mood and affect. behavior is normal. Judgment and thought content normal.   Results for orders placed or performed in visit on 11/08/18 (from the past 72 hour(s))  POCT urinalysis dipstick     Status: Abnormal   Collection Time: 11/08/18  2:53 PM  Result Value Ref Range   Color, UA yellow    Clarity, UA clear    Glucose, UA Negative Negative   Bilirubin, UA negative    Ketones, UA negative    Spec Grav, UA 1.010 1.010 - 1.025   Blood, UA negative    pH, UA 7.5 5.0 - 8.0  Protein, UA Positive (A) Negative   Urobilinogen, UA negative (A) 0.2 or 1.0 E.U./dL   Nitrite, UA negative    Leukocytes, UA Large (3+) (A) Negative   Appearance clear    Odor none   CBC     Status: None   Collection Time: 11/08/18  3:13 PM  Result Value Ref Range   WBC 5.8 3.8 - 10.8 Thousand/uL   RBC 4.32 3.80 - 5.10 Million/uL   Hemoglobin 12.6 11.7 - 15.5 g/dL   HCT 37.4 35.0 - 45.0 %   MCV 86.6 80.0 - 100.0 fL   MCH 29.2 27.0 - 33.0 pg   MCHC 33.7 32.0 - 36.0 g/dL   RDW 12.9 11.0 - 15.0 %   Platelets 235 140 - 400 Thousand/uL   MPV 11.4 7.5 - 12.5 fL  BASIC METABOLIC PANEL WITH GFR     Status: Abnormal   Collection Time: 11/08/18  3:13 PM  Result Value Ref Range   Glucose, Bld 108 (H) 65 - 99 mg/dL    Comment: .            Fasting reference interval . For someone without known diabetes, a glucose value between 100 and 125 mg/dL is consistent with prediabetes and should be confirmed with a follow-up test. .    BUN 14 7 - 25 mg/dL   Creat 0.88 0.50 - 0.99 mg/dL    Comment:  For patients >24 years of age, the reference limit for Creatinine is approximately 13% higher for people identified as African-American. .    GFR, Est Non African American 69 > OR = 60 mL/min/1.66m2   GFR, Est African American 80 > OR = 60 mL/min/1.59m2   BUN/Creatinine Ratio NOT APPLICABLE 6 - 22 (calc)   Sodium 140 135 - 146 mmol/L   Potassium 3.4 (L) 3.5 - 5.3 mmol/L   Chloride 100 98 - 110 mmol/L   CO2 33 (H) 20 - 32 mmol/L   Calcium 10.2 8.6 - 10.4 mg/dL    Assessment & Plan  1. Acute cystitis without hematuria - Pt appears fatigued today, we will treat with IM rocephin and await lab results for PO treatment. - POCT urinalysis dipstick - CBC - BASIC METABOLIC PANEL WITH GFR - cefTRIAXone (ROCEPHIN) injection 500 mg - lidocaine (PF) (XYLOCAINE) 1 % injection 1 mL  2. Pyuria - CBC - BASIC METABOLIC PANEL WITH GFR - cefTRIAXone (ROCEPHIN) injection 500 mg  3. Fatigue, unspecified type - POCT urinalysis dipstick - CBC - BASIC METABOLIC PANEL WITH GFR  4. Decreased urine output - POCT urinalysis dipstick  -Red flags and when to present for emergency care or RTC including fever >101.64F, chest pain, shortness of breath, new/worsening/un-resolving symptoms, malaise, CVA/flank pain, vomiting reviewed with patient at time of visit. Follow up and care instructions discussed and provided in AVS.

## 2018-11-09 LAB — URINE CULTURE
MICRO NUMBER:: 216498
Result:: NO GROWTH
SPECIMEN QUALITY:: ADEQUATE

## 2018-11-10 ENCOUNTER — Telehealth: Payer: Self-pay | Admitting: Gastroenterology

## 2018-11-10 NOTE — Telephone Encounter (Signed)
Procedure has been cancelled per pt  and pt has been notified and verbalized understanding

## 2018-11-10 NOTE — Telephone Encounter (Signed)
Patient called & wants to cancel her colonoscopy scheduled for 11-23-2018 with no reschedule.

## 2018-11-13 ENCOUNTER — Other Ambulatory Visit: Payer: Self-pay

## 2018-11-13 ENCOUNTER — Telehealth: Payer: Self-pay | Admitting: Gastroenterology

## 2018-11-13 DIAGNOSIS — R1084 Generalized abdominal pain: Secondary | ICD-10-CM

## 2018-11-13 NOTE — Telephone Encounter (Signed)
Called patient to check how she is doing.  She reports she is feeling significantly better, mild abdominal discomfort only.  She is being treated for cystitis with Bactrim for 10 days.  She has not started prednisone.  She is taking azathioprine 50 mg 1 pill a day and requesting that to be sent to the mail order pharmacy as she does not need to pay any co-pay.  She canceled colonoscopy because she wanted to see how she does after treating cystitis.  She has follow-up with me next week.  Plan to check CBC, LFTs, lipase during follow-up  [name], MD [address]  [place]  [place], [place] [address]  Main: [phone]  Fax: [phone] Pager: [phone]

## 2018-11-15 ENCOUNTER — Encounter: Payer: Self-pay | Admitting: Gastroenterology

## 2018-11-16 ENCOUNTER — Ambulatory Visit: Payer: Medicare Other | Admitting: Gastroenterology

## 2018-11-20 ENCOUNTER — Other Ambulatory Visit: Payer: Self-pay

## 2018-11-20 ENCOUNTER — Ambulatory Visit (INDEPENDENT_AMBULATORY_CARE_PROVIDER_SITE_OTHER): Payer: Medicare Other | Admitting: Gastroenterology

## 2018-11-20 ENCOUNTER — Encounter: Payer: Self-pay | Admitting: Gastroenterology

## 2018-11-20 VITALS — BP 158/78 | HR 69 | Ht 60.0 in | Wt 134.8 lb

## 2018-11-20 DIAGNOSIS — K5080 Crohn's disease of both small and large intestine without complications: Secondary | ICD-10-CM

## 2018-11-20 DIAGNOSIS — K5000 Crohn's disease of small intestine without complications: Secondary | ICD-10-CM

## 2018-11-20 NOTE — Progress Notes (Signed)
Carolyn Darby, MD [address]  [place]  [place], [place] [address]  Main: [phone]  Fax: [phone]    Gastroenterology Consultation  Referring Provider:     Hubbard Hartshorn, FNP Primary Care Physician:  Hubbard Hartshorn, FNP Primary Gastroenterologist:  Dr. Cephas Campbell Reason for Consultation: Crohn's Disease        HPI:   Carolyn Campbell is a 63 y.o. African-American female referred by Dr. Uvaldo Rising, Astrid Divine, FNP  for consultation & management of Crohn's disease. This was diagnosed in 2015, currently on Imuran 50 mg daily. She reports intermittent flareups of her Crohn's disease such as abdominal pain, bloating, rectal bleeding. She received prednisone for flare up. She had 2 flareups within last 1 year. She recently is suffering with constipation, had to take laxatives to have a BM. Her last BM was 3 days ago. She does report some upper abdominal bloating. She denies any weight loss, rectal bleeding, diarrhea, nausea, vomiting. She is not on any prednisone currently. She denies taking NSAIDs.  Follow up visit 10/24/18 She thinks she has flare up of crohn's as she has mild abdominal discomfort that limited her PO intake. She continues to have atypical chest discomfort for which she is taking tums. She mentioned about chest pain during last visit, I urged her to see her PCP which she did not yet  Follow-up visit 11/06/2018 Since last visit, her symptoms worsened.  Therefore, patient made an urgent visit to see me today.  She reports upper abdominal pain limiting her p.o. intake, results in nausea, describes it as burning.  She is taking Tums and Prilosec as needed which provides some relief. She denies diarrhea.  She lost about 6 pounds in last 3 weeks.  She denies fever, chills.  She is accompanied by her husband today.  I was planning to start her on immunomodulator, she underwent screening for TB and hepatitis B, which were negative.  TPMT levels were normal.  Follow-up visit  11/20/2018 She reports feeling significantly better, reports mild pain in the epigastric region.  She started taking azathioprine 50 mg daily.  She tried prednisone 40 mg for 2 days then developed itching associated with rash in her hands, she felt itching might be secondary to prednisone so stopped taking it.  Rash currently resolved.  Eating well.  CT abdomen and pelvis revealed possible cystitis, UA showed leukocytes and she was given ceftriaxone at her PCPs office and given Bactrim twice daily for 5 days.  Urine cultures are negative  Crohn's disease classification:  Age: > 40 Location: ileocolonic  Behavior: non stricturing, non penetrating  Perianal: no  IBD diagnosis: 11/2013  Disease course:Crohn's disease in 2015, initial symptoms were abdominal pain, blood in stool and on wiping, bloating. She had a colonoscopy, VCE, EGD at the time of diagnosis. She was initially on mesalamine 4 pills daily, prednisone later switched to Remicade in 11/2014. She took only 5 doses as she could not afford. Then she was switched to Imuran 50 mg daily. She had mild flareups about twice a year. Colonoscopy 05/2017 revealed mild ileocolonic disease primarily on histology. She self discontinued Imuran. Lialda started in 06/2017.  Possible exacerbation of Crohn's disease with elevated fecal calprotectin in 10/2018.  CT abdomen and pelvis unremarkable.  Discontinued Lialda, started on azathioprine 50 mg daily.  Extra intestinal manifestations: None  IBD surgical history: None No family history of IBD Imaging:  MRE none CTE none SBFT none  Procedures:  Colonoscopy :  01/28/2005, showed external hemorrhoids only Colonoscopy 12/10/2013, showed normal terminal ileum, biopsies performed. Skipped areas of nonbleeding ulcerative mucosa were seen in the entire colon, biopsies performed. 4 mm polyp in the sigmoid colon Pathology: Rectal biopsy mild-to-moderate active proctitis with architectural features of chronicity,  negative for dysplasia and malignancy. Terminal ileum: Small bowel mucosa with preserved villous architecture. Right colon biopsy: No significant pathologic changes  Upper Endoscopy 12/10/2013, underwent dilation of the esophagus Ampullary biopsy: Normal, gastric biopsy: Intramucosal with erosion and mild chronic gastritis, negative for H. pylori, dysplasia and malignancy  VCE 05/06/2014: Erosions in the distal ileum  EGD 06/08/2017 Normal esophagus, small hiatal hernia, normal stomach and duodenum  Colonoscopy 06/08/2017 The perianal and digital rectal examinations were normal. Pertinent negatives include normal sphincter tone and no palpable rectal Lesions. The terminal ileum appeared normal. Biopsies were taken with a cold forceps for histology. Two scattered non-bleeding aphthae were found in the ascending colon. Rest of the colon and rectum appeared normal. Random biopsies performed   IBD medications:  Steroids: Prednisone, budesonide 5-ASA: mesalamine, Lialda Immunomodulators: stopped azathioprine  TPMT status unknown Biologics:  Anti TNFs: Remicade monotherapy, received induction followed by 1 maintenance dose in 11/2014 Anti Integrins: Ustekinumab: Tofactinib: Clinical trial:   Past Medical History:  Diagnosis Date  . Allergic rhinitis   . Anginal pain ([place])    states MD said it was GERD  . Arthritis   . Asthma   . Breast cancer ([facility]) 2006   LT LUMPECTOMY  . CD (Crohn's disease) ([facility]) 05/21/2015   FOLLOWED BY GI   . Clotting disorder ([place])   . Cognitive deficits as late effect of cerebrovascular disease   . COPD (chronic obstructive pulmonary disease) ([facility])   . Crohn's disease of both small and large intestine with rectal bleeding ([place]) 12/04/2014  . Depression    Currently taking zoloft.  . 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 Dyspnea   . Esophageal reflux   . Fever blister 07/19/2018  . Glaucoma    vitreous degeneration  . History of  kidney stones   . Hyperlipidemia   . Hypertension   . Hypothyroidism   . Occlusion, cerebral artery    NOS w/infarction  . Osteoporosis   . Personal history of radiation therapy 2006   BREAST CA  . Stroke ([place] [place])   . Ulcer     Past Surgical History:  Procedure Laterality Date  . BREAST BIOPSY Left 2006   POS  . BREAST LUMPECTOMY Left 09/20/2004   positive  . BREAST SURGERY Left    malignant biopsy  . CARDIAC CATHETERIZATION    . COLONOSCOPY  2015  . COLONOSCOPY WITH PROPOFOL N/A 06/08/2017   Procedure: COLONOSCOPY WITH PROPOFOL;  Surgeon: Lin Landsman, MD;  Location: [facility];  Service: Gastroenterology;  Laterality: N/A;  . ESOPHAGOGASTRODUODENOSCOPY  2015  . ESOPHAGOGASTRODUODENOSCOPY (EGD) WITH PROPOFOL N/A 06/08/2017   Procedure: ESOPHAGOGASTRODUODENOSCOPY (EGD) WITH PROPOFOL;  Surgeon: Lin Landsman, MD;  Location: [facility];  Service: Gastroenterology;  Laterality: N/A;  . FINGER SURGERY     Right small finger  . FRACTURE SURGERY    . [place] STUDY  2015  . TUBAL LIGATION       Current Outpatient Medications:  .  alendronate (FOSAMAX) 70 MG tablet, TAKE 1 TABLET BY MOUTH ONCE A WEEK. TAKE WITH A FULL  GLASS OF WATER ON AN EMPTY  STOMACH., Disp: 12 tablet, Rfl: 3 .  atorvastatin (LIPITOR) 40 MG tablet,  Take 1 tablet (40 mg total) by mouth daily., Disp: 90 tablet, Rfl: 0 .  azaTHIOprine (IMURAN) 50 MG tablet, Take 1 tablet (50 mg total) by mouth daily., Disp: 90 tablet, Rfl: 0 .  diclofenac sodium (VOLTAREN) 1 % GEL, Apply 2 g topically 4 (four) times daily., Disp: 100 g, Rfl: 0 .  gabapentin (NEURONTIN) 300 MG capsule, Take 1 capsule (300 mg total) by mouth at bedtime., Disp: 90 capsule, Rfl: 0 .  hydrochlorothiazide (HYDRODIURIL) 12.5 MG tablet, Take 1 tablet (12.5 mg total) by mouth daily., Disp: 90 tablet, Rfl: 1 .  latanoprost (XALATAN) 0.005 % ophthalmic solution, Place 1 drop into both eyes at bedtime. , Disp: , Rfl:  .   levothyroxine (SYNTHROID, LEVOTHROID) 50 MCG tablet, Take 1 tablet (50 mcg total) by mouth daily., Disp: 90 tablet, Rfl: 0 .  Multiple Vitamin (MULTIVITAMIN) tablet, Take 1 tablet by mouth daily., Disp: , Rfl:  .  ondansetron (ZOFRAN) 4 MG tablet, Take 1 tablet (4 mg total) by mouth every 8 (eight) hours as needed for up to 14 doses for nausea or vomiting., Disp: 14 tablet, Rfl: 0 .  sertraline (ZOLOFT) 50 MG tablet, Take 0.5 tablets (25 mg total) by mouth daily., Disp: 90 tablet, Rfl: 0 .  triamcinolone (KENALOG) 0.025 % ointment, Apply 1 application topically 2 (two) times daily., Disp: 30 g, Rfl: 0 .  valACYclovir (VALTREX) 1000 MG tablet, Take 1 tablet (1,000 mg total) by mouth 2 (two) times daily., Disp: 30 tablet, Rfl: 5 .  predniSONE (DELTASONE) 10 MG tablet, Take 4 tablets (40 mg total) by mouth daily with breakfast for 30 days. (Patient not taking: Reported on 11/20/2018), Disp: 120 tablet, Rfl: 0  Current Facility-Administered Medications:  .  ondansetron (ZOFRAN) tablet 4 mg, 4 mg, Oral, Once, Kip Kautzman, Tally Due, MD .  promethazine (PHENERGAN) tablet 12.5 mg, 12.5 mg, Oral, Once, Sheccid Lahmann, Tally Due, MD   Family History  Problem Relation Age of Onset  . Heart disease Mother   . Heart attack Mother   . Breast cancer Sister   . Alcohol abuse Father   . Cerebrovascular Accident Father   . Arthritis Father   . Hypertension Father   . Hypertension Other   . Diabetes Other   . Breast cancer Sister 26  . Heart attack Other 41  . Cancer Sister      Social History   Tobacco Use  . Smoking status: Never Smoker  . Smokeless tobacco: Never Used  Substance Use Topics  . Alcohol use: No    Alcohol/week: 0.0 standard drinks  . Drug use: No    Allergies as of 11/20/2018  . (No Known Allergies)    Review of Systems:    All systems reviewed and negative except where noted in HPI.   Physical Exam:  BP (!) 158/78   Pulse 69   Ht 5' (1.524 m)   Wt 134 lb 12.8 oz (61.1 kg)    BMI 26.33 kg/m  No LMP recorded. Patient is postmenopausal.  General:   Alert,  Well-developed, well-nourished, pleasant and cooperative in NAD Head:  Normocephalic and atraumatic. Eyes:  Sclera clear, no icterus.   Conjunctiva pink. Ears:  Normal auditory acuity. Nose:  No deformity, discharge, or lesions. Mouth:  No deformity or lesions,oropharynx pink & moist. Neck:  Supple; no masses or thyromegaly. Lungs:  Respirations even and unlabored.  Clear throughout to auscultation.   No wheezes, crackles, or rhonchi. No acute distress. Heart:  Regular rate and rhythm;  no murmurs, clicks, rubs, or gallops. Abdomen:  Normal bowel sounds.  No bruits.  Soft, mild tenderness in upper abdomen, and non-distended without masses, hepatosplenomegaly or hernias noted.  No guarding or rebound tenderness.   Rectal: Nor performed Msk:  Symmetrical without gross deformities. Good, equal movement & strength bilaterally. Pulses:  Normal pulses noted. Extremities:  No clubbing or edema.  No cyanosis. Neurologic:  Alert and oriented x3;  grossly normal neurologically. Skin:  Intact without significant lesions or rashes. No jaundice. Psych:  Alert and cooperative. Normal mood and affect.  Imaging Studies: MRI reviewed  Assessment and Plan:   MASHAYLA LAVIN is a 63 y.o. African-American female with Terminal ileal and colonic Crohn's, diagnosed in 2015 who was previously maintained on Imuran 50 mg daily with mild intermittent flareups about twice a year needing prednisone. She does not have any endoscopy evidence of ileocolonic Crohn's disease other than a few scattered aphthae in the ascending colon on colonoscopy in 05/2017 but histology revealed chronic mild active ileitis and right-sided colitis. MR enterography did not inflammation in small bowel. I tried to give her budesonide but she could not afford high co-pay of $500 for 30 days supply.  She stopped Imuran by herself in the past.  Recent flareup of her  Crohn's disease with worsening of upper abdominal pain associated with nausea, unintentional weight loss, poor p.o. intake. Fecal calprotectin levels are elevated.  Discontinued Lialda, currently on azathioprine 50 mg daily.  She to prednisone for 2 days only due to new onset of rash and itching.  Rash has currently resolved.  Okay to hold off on prednisone at this time as her symptoms have significantly improved  Crohn's disease of small intestine and large intestine -Continue azathioprine 50 mg daily Check CBC, LFTs today I recommend EGD and colonoscopy to assess her underlying Crohn's disease given recent flareup and elevated fecal calprotectin levels, patient is agreeable   IBD Health Maintenance  1.TB status: Gold quantiferon negative 10/2018 2. Anemia: normal Hb, vitamin B12 and ferritin levels are normal 3.Immunizations: Hep A immune, hepatitis B not immune, received 1 dose in 06/2017, restart Twinrix today, influenza vaccine received in 06/2018, prevnar received in 06/2017, pneumovax to be administered today 4.Cancer screening I) Colon cancer/dysplasia surveillance: Colonoscopy 05/2017, no evidence of dysplasia and no polyps identified.  II) Cervical cancer: n/a III) Skin cancer - n/a  5.Bone health Vitamin D status: Normal Bone density testing: Not done 5. Labs: today 6. Smoking: Never smoked 7. NSAIDs and Antibiotics use: Denies NSAID use and antibiotic use Advised her to discuss about unilateral headaches with her primary care doctor  Follow up in 2 months   Carolyn Darby, MD

## 2018-11-21 ENCOUNTER — Encounter: Payer: Self-pay | Admitting: Gastroenterology

## 2018-11-21 LAB — CBC
Hematocrit: 35.7 % (ref 34.0–46.6)
Hemoglobin: 11.8 g/dL (ref 11.1–15.9)
MCH: 28.6 pg (ref 26.6–33.0)
MCHC: 33.1 g/dL (ref 31.5–35.7)
MCV: 86 fL (ref 79–97)
Platelets: 254 10*3/uL (ref 150–450)
RBC: 4.13 x10E6/uL (ref 3.77–5.28)
RDW: 13 % (ref 11.7–15.4)
WBC: 5.8 10*3/uL (ref 3.4–10.8)

## 2018-11-21 LAB — HEPATIC FUNCTION PANEL
ALT: 15 IU/L (ref 0–32)
AST: 17 IU/L (ref 0–40)
Albumin: 4.3 g/dL (ref 3.8–4.8)
Alkaline Phosphatase: 74 IU/L (ref 39–117)
Bilirubin Total: 0.4 mg/dL (ref 0.0–1.2)
Bilirubin, Direct: 0.12 mg/dL (ref 0.00–0.40)
Total Protein: 6.7 g/dL (ref 6.0–8.5)

## 2018-11-22 NOTE — Telephone Encounter (Signed)
Patient called regarding the 90 day supply to her medication from prior message sent in to [facility]. She has only enough until 12-05-2018.

## 2018-11-23 ENCOUNTER — Ambulatory Visit: Admit: 2018-11-23 | Payer: Medicare Other | Admitting: Gastroenterology

## 2018-11-23 SURGERY — COLONOSCOPY WITH PROPOFOL
Anesthesia: General

## 2018-11-30 ENCOUNTER — Encounter: Payer: Self-pay | Admitting: Gastroenterology

## 2018-12-01 ENCOUNTER — Other Ambulatory Visit: Payer: Self-pay | Admitting: Family Medicine

## 2018-12-01 ENCOUNTER — Other Ambulatory Visit: Payer: Self-pay | Admitting: Gastroenterology

## 2018-12-01 DIAGNOSIS — E7800 Pure hypercholesterolemia, unspecified: Secondary | ICD-10-CM

## 2018-12-01 DIAGNOSIS — I700 Atherosclerosis of aorta: Secondary | ICD-10-CM

## 2018-12-01 DIAGNOSIS — K5080 Crohn's disease of both small and large intestine without complications: Secondary | ICD-10-CM

## 2018-12-01 MED ORDER — ATORVASTATIN CALCIUM 40 MG PO TABS: 40.0000 mg | ORAL_TABLET | Freq: Every day | ORAL | 1 refills | Status: DC

## 2018-12-06 ENCOUNTER — Other Ambulatory Visit: Payer: Self-pay | Admitting: Gastroenterology

## 2018-12-06 DIAGNOSIS — K5080 Crohn's disease of both small and large intestine without complications: Secondary | ICD-10-CM

## 2018-12-06 DIAGNOSIS — K5000 Crohn's disease of small intestine without complications: Secondary | ICD-10-CM

## 2018-12-06 MED ORDER — AZATHIOPRINE 50 MG PO TABS: 50.0000 mg | ORAL_TABLET | Freq: Every day | ORAL | 0 refills | Status: DC

## 2018-12-06 NOTE — Progress Notes (Signed)
Called patient to inform her about cancellation of her upper endoscopy and colonoscopy tomorrow.  Explained to her that we are implementing this due to COVID-19 pandemic to prevent the community spread.  Her symptoms related to Crohn's disease are currently under control on azathioprine 50 mg daily Advised her to call our office if she has any questions or concerns  Advised her to continue azathioprine at current dose  I have sent in prescription to the mail in [place], [place] [address]  [place]  [place], [place] [address]  Main: [phone]  Fax: [phone] Pager: [phone]

## 2018-12-07 ENCOUNTER — Ambulatory Visit: Admission: RE | Admit: 2018-12-07 | Payer: Medicare Other | Source: Ambulatory Visit | Admitting: Gastroenterology

## 2018-12-07 ENCOUNTER — Encounter: Admission: RE | Payer: Self-pay | Source: Ambulatory Visit

## 2018-12-07 SURGERY — COLONOSCOPY WITH PROPOFOL
Anesthesia: General

## 2018-12-08 ENCOUNTER — Other Ambulatory Visit: Payer: Self-pay

## 2018-12-08 NOTE — Progress Notes (Signed)
Medication has been refilled and sent to mail order pharmacy on 12/06/2018

## 2018-12-18 ENCOUNTER — Encounter: Payer: Self-pay | Admitting: Family Medicine

## 2019-01-11 ENCOUNTER — Telehealth: Payer: Self-pay | Admitting: Gastroenterology

## 2019-01-11 ENCOUNTER — Ambulatory Visit (INDEPENDENT_AMBULATORY_CARE_PROVIDER_SITE_OTHER): Payer: Medicare Other | Admitting: Gastroenterology

## 2019-01-11 DIAGNOSIS — K5080 Crohn's disease of both small and large intestine without complications: Secondary | ICD-10-CM | POA: Diagnosis not present

## 2019-01-11 NOTE — Telephone Encounter (Signed)
Patient is going to the hospital for lab work ordered by DR Marius Ditch tomorrow. She is going to pick up a stool kit & wants to make sure where she carries it too. Please call patient.

## 2019-01-11 NOTE — Progress Notes (Signed)
Carolyn Sear, MD [address]  [place]  [place], [place] [address]  Main: [phone]  Fax: [phone]    Gastroenterology follow-up video Visit  Referring Provider:     Hubbard Hartshorn, FNP Primary Care Physician:  Hubbard Hartshorn, FNP Primary Gastroenterologist:  Dr. [name] Reason for Consultation:     Crohn's disease        HPI:   Carolyn Campbell is a 63 y.o. female referred by Dr. Uvaldo Rising, Astrid Divine, Pe[place]  for consultation & management of Crohn's disease  Virtual Visit Video Note  I connected with [name] on 01/11/19 at 11:00 AM EDT by video and verified that I am speaking with the correct person using two identifiers.   I discussed the limitations, risks, security and privacy concerns of performing an evaluation and management service by video and the availability of in person appointments. I also discussed with the patient that there may be a patient responsible charge related to this service. The patient expressed understanding and agreed to proceed.  Location of the Patient: Home  Location of the provider: Home office   History of Present Illness: Since last visit, [name] reports that she has been doing well overall.  She continues to take azathioprine 50 mg daily.  She reports that she gained few pounds.  Her upper abdominal discomfort has improved. She denies any concerns today   Past Medical History:  Diagnosis Date  . Allergic rhinitis   . Anginal pain ([facility])    states MD said it was GERD  . Arthritis   . Asthma   . Breast cancer ([facility]) 2006   LT LUMPECTOMY  . CD (Crohn's disease) ([facility]) 05/21/2015   FOLLOWED BY GI   . Clotting disorder ([facility])   . Cognitive deficits as late effect of cerebrovascular disease   . COPD (chronic obstructive pulmonary disease) ([facility])   . Crohn's disease of both small and large intestine with rectal bleeding ([facility]) 12/04/2014  . Depression    Currently taking zoloft.  . Dysarthria as late effect of  cerebrovascular disease   . Dyspnea   . Esophageal reflux   . Fever blister 07/19/2018  . Glaucoma    vitreous degeneration  . History of kidney stones   . Hyperlipidemia   . Hypertension   . Hypothyroidism   . Occlusion, cerebral artery    NOS w/infarction  . Osteoporosis   . Personal history of radiation therapy 2006   BREAST CA  . Stroke ([facility])   . Ulcer     Past Surgical History:  Procedure Laterality Date  . BREAST BIOPSY Left 2006   POS  . BREAST LUMPECTOMY Left 09/20/2004   positive  . BREAST SURGERY Left    malignant biopsy  . CARDIAC CATHETERIZATION    . COLONOSCOPY  2015  . COLONOSCOPY WITH PROPOFOL N/A 06/08/2017   Procedure: COLONOSCOPY WITH PROPOFOL;  Surgeon: Lin Landsman, MD;  Location: [facility];  Service: Gastroenterology;  Laterality: N/A;  . ESOPHAGOGASTRODUODENOSCOPY  2015  . ESOPHAGOGASTRODUODENOSCOPY (EGD) WITH PROPOFOL N/A 06/08/2017   Procedure: ESOPHAGOGASTRODUODENOSCOPY (EGD) WITH PROPOFOL;  Surgeon: Lin Landsman, MD;  Location: [facility];  Service: Gastroenterology;  Laterality: N/A;  . FINGER SURGERY     Right small finger  . FRACTURE SURGERY    . [place] STUDY  2015  . TUBAL LIGATION      Prior to Admission medications   Medication Sig Start Date End Date  Taking? Authorizing Provider  alendronate (FOSAMAX) 70 MG tablet TAKE 1 TABLET BY MOUTH ONCE A WEEK. TAKE WITH A FULL  GLASS OF WATER ON AN EMPTY  STOMACH. 09/21/18  Yes Hubbard Hartshorn, FNP  atorvastatin (LIPITOR) 40 MG tablet Take 1 tablet (40 mg total) by mouth daily. 12/01/18  Yes Hubbard Hartshorn, FNP  azaTHIOprine (IMURAN) 50 MG tablet Take 1 tablet (50 mg total) by mouth daily. 11/06/18 02/04/19 Yes Maytal Mijangos, Tally Due, MD  azaTHIOprine (IMURAN) 50 MG tablet Take 1 tablet (50 mg total) by mouth daily. 12/06/18 03/06/19 Yes Arrie Zuercher, Tally Due, MD  diclofenac sodium (VOLTAREN) 1 % GEL Apply 2 g topically 4 (four) times daily. 05/18/18  Yes Hubbard Hartshorn, FNP   gabapentin (NEURONTIN) 300 MG capsule Take 1 capsule (300 mg total) by mouth at bedtime. 09/19/18  Yes Hubbard Hartshorn, FNP  hydrochlorothiazide (HYDRODIURIL) 12.5 MG tablet Take 1 tablet (12.5 mg total) by mouth daily. 09/21/18  Yes Hubbard Hartshorn, FNP  latanoprost (XALATAN) 0.005 % ophthalmic solution Place 1 drop into both eyes at bedtime.  04/22/16  Yes [provider]  levothyroxine (SYNTHROID, LEVOTHROID) 50 MCG tablet Take 1 tablet (50 mcg total) by mouth daily. 09/19/18  Yes Hubbard Hartshorn, FNP  Multiple Vitamin (MULTIVITAMIN) tablet Take 1 tablet by mouth daily.   Yes [provider]  ondansetron (ZOFRAN) 4 MG tablet Take 1 tablet (4 mg total) by mouth every 8 (eight) hours as needed for up to 14 doses for nausea or vomiting. 11/06/18  Yes Jalesa Thien, Tally Due, MD  sertraline (ZOLOFT) 50 MG tablet Take 0.5 tablets (25 mg total) by mouth daily. 09/19/18  Yes Hubbard Hartshorn, FNP  triamcinolone (KENALOG) 0.025 % ointment Apply 1 application topically 2 (two) times daily. 10/18/18  Yes Hubbard Hartshorn, FNP  valACYclovir (VALTREX) 1000 MG tablet Take 1 tablet (1,000 mg total) by mouth 2 (two) times daily. 03/28/18  Yes Poulose, Bethel Born, NP    Family History  Problem Relation Age of Onset  . Heart disease Mother   . Heart attack Mother   . Breast cancer Sister   . Alcohol abuse Father   . Cerebrovascular Accident Father   . Arthritis Father   . Hypertension Father   . Hypertension Other   . Diabetes Other   . Breast cancer Sister 65  . Heart attack Other 41  . Cancer Sister      Social History   Tobacco Use  . Smoking status: Never Smoker  . Smokeless tobacco: Never Used  Substance Use Topics  . Alcohol use: No    Alcohol/week: 0.0 standard drinks  . Drug use: No    Allergies as of 01/11/2019  . (No Known Allergies)     Imaging Studies: Reviewed  Assessment and Plan:   Carolyn Campbell is a 63 y.o. female with African-American female with Terminal ileal  and colonic Crohn's, diagnosed in 2015 who was previously maintained on Imuran 50 mg daily with mild intermittent flareups about twice a year needing prednisone. She does not have any endoscopy evidence of ileocolonic Crohn's disease other than a few scattered aphthae in the ascending colon on colonoscopy in 05/2017 but histology revealed chronic mild active ileitis and right-sided colitis. MR enterography did not inflammation in small bowel. I tried to give her budesonide but she could not afford high co-pay of $500 for 30 days supply.  She stopped Imuran by herself in the past.  Recent flareup of her Crohn's  disease with worsening of upper abdominal pain associated with nausea, unintentional weight loss, poor p.o. intake. Fecal calprotectin levels are elevated.  Discontinued Lialda, currently on azathioprine 50 mg daily.  She to prednisone for 2 days only due to new onset of rash and itching.  Rash has currently resolved.  Okay to hold off on prednisone at this time as her symptoms have significantly improved  Crohn's disease of small intestine and large intestine Continue azathioprine 50 mg daily, may increase to 75 mg daily based on the labs today Check CBC, LFTs today Check fecal calprotectin levels today I recommend EGD and colonoscopy to assess her underlying Crohn's disease given recent flareup and elevated fecal calprotectin levels, patient is agreeable, will schedule in the next 1 to 2 months   IBD Health Maintenance  1.TB status: Gold quantiferon negative 10/2018 2. Anemia: normal Hb, vitamin B12 and ferritin levels are normal 3.Immunizations: Hep A immune, hepatitis B not immune, received 1 dose in 06/2017, restart Twinrix, influenza vaccine received in 06/2018, prevnar received in 06/2017, pneumovax to be administered  4.Cancer screening I) Colon cancer/dysplasia surveillance: Colonoscopy 05/2017, no evidence of dysplasia and no polyps identified.  II) Cervical cancer: n/a III) Skin  cancer - n/a  5.Bone health Vitamin D status: Normal Bone density testing: Not done 5. Labs: today 6. Smoking: Never smoked 7. NSAIDs and Antibiotics use: Denies NSAID use and antibiotic use   Follow Up Instructions:   I discussed the assessment and treatment plan with the patient. The patient was provided an opportunity to ask questions and all were answered. The patient agreed with the plan and demonstrated an understanding of the instructions.   The patient was advised to call back or seek an in-person evaluation if the symptoms worsen or if the condition fails to improve as anticipated.  I provided 15 minutes of face-to-face time during this encounter.   Follow up in 2 months   [name], MD

## 2019-01-12 ENCOUNTER — Other Ambulatory Visit
Admission: RE | Admit: 2019-01-12 | Discharge: 2019-01-12 | Disposition: A | Discharge status: Home / Self Care | Payer: Medicare Other | Source: Ambulatory Visit | Attending: Gastroenterology | Admitting: Gastroenterology

## 2019-01-12 DIAGNOSIS — K5080 Crohn's disease of both small and large intestine without complications: Secondary | ICD-10-CM | POA: Insufficient documentation

## 2019-01-12 LAB — CBC
HCT: 37.1 % (ref 36.0–46.0)
Hemoglobin: 12.1 g/dL (ref 12.0–15.0)
MCH: 29.2 pg (ref 26.0–34.0)
MCHC: 32.6 g/dL (ref 30.0–36.0)
MCV: 89.4 fL (ref 80.0–100.0)
Platelets: 228 10*3/uL (ref 150–400)
RBC: 4.15 MIL/uL (ref 3.87–5.11)
RDW: 13.5 % (ref 11.5–15.5)
WBC: 4.6 10*3/uL (ref 4.0–10.5)
nRBC: 0 % (ref 0.0–0.2)

## 2019-01-12 LAB — HEPATIC FUNCTION PANEL
ALT: 17 U/L (ref 0–44)
AST: 21 U/L (ref 15–41)
Albumin: 4.2 g/dL (ref 3.5–5.0)
Alkaline Phosphatase: 76 U/L (ref 38–126)
Bilirubin, Direct: <0.1 mg/dL (ref 0.0–0.2)
Total Bilirubin: 0.6 mg/dL (ref 0.3–1.2)
Total Protein: 7.6 g/dL (ref 6.5–8.1)

## 2019-01-12 NOTE — Telephone Encounter (Signed)
Spoke with pt and she will go to [facility] labs and also return stool samples back to [facility] lab, pt verbalized understanding.

## 2019-01-15 ENCOUNTER — Other Ambulatory Visit: Payer: Self-pay

## 2019-01-15 DIAGNOSIS — K5080 Crohn's disease of both small and large intestine without complications: Secondary | ICD-10-CM

## 2019-01-15 LAB — CALPROTECTIN, FECAL: Calprotectin, Fecal: 259 ug/g — ABNORMAL HIGH (ref 0–120)

## 2019-01-22 ENCOUNTER — Ambulatory Visit: Payer: Medicare Other | Admitting: Gastroenterology

## 2019-02-03 ENCOUNTER — Other Ambulatory Visit: Payer: Self-pay | Admitting: Family Medicine

## 2019-02-03 DIAGNOSIS — E038 Other specified hypothyroidism: Secondary | ICD-10-CM

## 2019-02-05 MED ORDER — LEVOTHYROXINE SODIUM 50 MCG PO TABS: 50.0000 ug | ORAL_TABLET | Freq: Every day | ORAL | 0 refills | Status: DC

## 2019-02-15 ENCOUNTER — Other Ambulatory Visit: Payer: Self-pay | Admitting: Gastroenterology

## 2019-02-15 DIAGNOSIS — K5080 Crohn's disease of both small and large intestine without complications: Secondary | ICD-10-CM

## 2019-02-16 ENCOUNTER — Encounter: Payer: Self-pay | Admitting: Family Medicine

## 2019-02-16 ENCOUNTER — Ambulatory Visit (INDEPENDENT_AMBULATORY_CARE_PROVIDER_SITE_OTHER): Payer: Medicare Other | Admitting: Family Medicine

## 2019-02-16 ENCOUNTER — Other Ambulatory Visit: Payer: Self-pay

## 2019-02-16 VITALS — BP 143/73

## 2019-02-16 DIAGNOSIS — K5000 Crohn's disease of small intestine without complications: Secondary | ICD-10-CM

## 2019-02-16 DIAGNOSIS — I10 Essential (primary) hypertension: Secondary | ICD-10-CM

## 2019-02-16 DIAGNOSIS — D849 Immunodeficiency, unspecified: Secondary | ICD-10-CM

## 2019-02-16 DIAGNOSIS — E559 Vitamin D deficiency, unspecified: Secondary | ICD-10-CM

## 2019-02-16 DIAGNOSIS — I83813 Varicose veins of bilateral lower extremities with pain: Secondary | ICD-10-CM | POA: Diagnosis not present

## 2019-02-16 DIAGNOSIS — I69359 Hemiplegia and hemiparesis following cerebral infarction affecting unspecified side: Secondary | ICD-10-CM | POA: Diagnosis not present

## 2019-02-16 DIAGNOSIS — K219 Gastro-esophageal reflux disease without esophagitis: Secondary | ICD-10-CM

## 2019-02-16 DIAGNOSIS — E7800 Pure hypercholesterolemia, unspecified: Secondary | ICD-10-CM

## 2019-02-16 DIAGNOSIS — M8580 Other specified disorders of bone density and structure, unspecified site: Secondary | ICD-10-CM

## 2019-02-16 DIAGNOSIS — M19041 Primary osteoarthritis, right hand: Secondary | ICD-10-CM

## 2019-02-16 DIAGNOSIS — F325 Major depressive disorder, single episode, in full remission: Secondary | ICD-10-CM

## 2019-02-16 DIAGNOSIS — M542 Cervicalgia: Secondary | ICD-10-CM

## 2019-02-16 DIAGNOSIS — E039 Hypothyroidism, unspecified: Secondary | ICD-10-CM

## 2019-02-16 DIAGNOSIS — D899 Disorder involving the immune mechanism, unspecified: Secondary | ICD-10-CM

## 2019-02-16 DIAGNOSIS — I700 Atherosclerosis of aorta: Secondary | ICD-10-CM

## 2019-02-16 DIAGNOSIS — Z853 Personal history of malignant neoplasm of breast: Secondary | ICD-10-CM

## 2019-02-16 DIAGNOSIS — G8929 Other chronic pain: Secondary | ICD-10-CM

## 2019-02-16 DIAGNOSIS — B001 Herpesviral vesicular dermatitis: Secondary | ICD-10-CM

## 2019-02-16 NOTE — Progress Notes (Signed)
Name: Carolyn Campbell   MRN: [id number]    DOB: 07-Apr-1956   Date:02/16/2019       Progress Note  Subjective  Chief Complaint  Chief Complaint  Patient presents with  . Follow-up    4 month receck    I connected with  AYELET GRUENEWALD  on 02/16/19 at 10:40 AM EDT by a video enabled telemedicine application and verified that I am speaking with the correct person using two identifiers.  I discussed the limitations of evaluation and management by telemedicine and the availability of in person appointments. The patient expressed understanding and agreed to proceed. Staff also discussed with the patient that there may be a patient responsible charge related to this service. Patient Location: Home Provider Location: [place] Additional Individuals present: None  HPI  HTN: BP is mildly elevated today,denies lightheadedness/orthostatic lightheadedness. She does not want to stop HCTZ due to occasional BLE edema. Stopped benazepril at last visit - BP's have been running 120-140's/50-70's. She denies chest pain, shortness of breath, headaches, vision changes (does note that she has glaucoma).  Varicose Veins: Saw vascular and was told to wear compression stockings, but has not been doing so.  She also notes varicose veins in bilateral upper thighs that are starting to become more prominent and painful.  Does have some bruising on the legs when she hits them on something.   Atherosclerosis of aorta:Taking lipitor every night, no SE's. Not taking aspirin due to Crohn's.  S/p CVA with left side weakness:States left side weakness is same and not worse. She endorses ongoing intermittent difficulty saying/choosing the correct words, but this has been stable.On statin & BP meds; lipids and BP well controlled. Las lipid was at goal.  Hyperlipidemia: Stable-taking statin and denies myalgia, chest pain or SOB, we will recheck labs. Patient ate ham sandwich  Immunosuppression secondary to Chron's  disease and medication use:Follows up with Dr. Marius Ditch has upper endoscopy scheduled for June 2020. Patient taking Imuran daily (tried Lialda, but changed back to Imuran).  Does note one episode last month of having abdominal pain when eating. [place] with BM, but is seeing some BRB when wiping; no abdominal pain, no vomiting.  GERD:Diet controlled- does not take protonix anymore;sees GI, avoids dietary triggers. She did have a few aphthous ulcers a few weeks ago and thinks this was from some acid reflux, but this has since resolved.  Hypothyroidism:Synthroid5mcghas been on stable dose for over 2 years, denies unintentional  weightchanges (notes has been more sedentary and eating more lately due to COVID-19 quarantine), bowel changes, skin/hair/nail changes. No history of thyroid nodules or enlarged thyroid. Last check was WNL, we will not check again today due to Video visit.   Hx LEFT breast cancer w/ lumpectomy:Has mammogram done11/19/2019 and was negative. Is doing mammograms once yearly, did do radiation in 2006. She is feeling well now.  Chronic back pain and neck with radiculitis: Has not been having back pain in quite some time - well controlled with gabapentin. Takes gabapentin every night.Sometimes has trouble getting comfortable to sleep at night.  Otherwise no changes.   Arthritis of right hand:states worse when she uses it a lot; patient states typically just deals with the pain sometimes does hand stretching for relief.Has voltaren gel, does have some stiffness and weaker grip at times. No changes.   Fever blisters:States takes valtrex as needed, for fever blisters around mouth, no recent outbreaks, though did have recent aphthous ulcers last week that have since resolved.  Osteopenia - taking Fossamax weekly. Last bone density was Sept 2018 - due at next visit for repeat DEXA scan.  No changes, no fractures. Has vitamin D deficiency, encouraged her to  take supplement.   Depression/ Anxiety - Takes zoloft every day- states it is well controlled on this dosehas probably been on it for 15 years, she would like to decrease and eventually come off of the medication if she can. States has had depression due to husbands health. Denies SI/HI.She has been taking 1/2 tablet (25mg ) for the last several months. She would like to think about increasing back to 50mg .    Office Visit from 02/16/2019 in [facility]  PHQ-9 Total Score  1     Patient Active Problem List   Diagnosis Date Noted  . Vitamin D deficiency 07/19/2018  . Varicose veins of both lower extremities with pain 07/19/2018  . Arthritis of right hand 12/21/2017  . Immunosuppressed status ([place]) 12/21/2017  . Major depression in remission ([place]) 12/21/2017  . Fibrocystic breast changes 06/22/2017  . Dysphagia as late effect of stroke 03/20/2017  . Osteopenia 10/18/2016  . Atherosclerosis of aorta ([place]) 12/19/2015  . Vitreous degeneration 05/21/2015  . Bad memory 05/21/2015  . Glaucoma associated with chamber angle anomalies 05/21/2015  . Dermatitis, eczematoid 05/21/2015  . CD (Crohn's disease) ([place]) 05/21/2015  . H/O malignant neoplasm of breast 05/21/2015  . Crohn's disease of both small and large intestine with rectal bleeding ([place]) 12/04/2014  . Solitary pulmonary nodule 11/07/2012  . 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 Cognitive deficits as late effect of cerebrovascular disease 01/20/2010  . CVA, old, hemiparesis ([place]) 04/28/2009  . Adult hypothyroidism 06/12/2008  . Acid reflux 05/23/2007  . Hypercholesterolemia without hypertriglyceridemia 01/18/2007  . Benign essential HTN 01/18/2007    Past Surgical History:  Procedure Laterality Date  . BREAST BIOPSY Left 2006   POS  . BREAST LUMPECTOMY Left 09/20/2004   positive  . BREAST SURGERY Left    malignant biopsy  . CARDIAC CATHETERIZATION    . COLONOSCOPY  2015  .  COLONOSCOPY WITH PROPOFOL N/A 06/08/2017   Procedure: COLONOSCOPY WITH PROPOFOL;  Surgeon: Lin Landsman, MD;  Location: [place];  Service: Gastroenterology;  Laterality: N/A;  . ESOPHAGOGASTRODUODENOSCOPY  2015  . ESOPHAGOGASTRODUODENOSCOPY (EGD) WITH PROPOFOL N/A 06/08/2017   Procedure: ESOPHAGOGASTRODUODENOSCOPY (EGD) WITH PROPOFOL;  Surgeon: Lin Landsman, MD;  Location: [place];  Service: Gastroenterology;  Laterality: N/A;  . FINGER SURGERY     Right small finger  . FRACTURE SURGERY    . [place] STUDY  2015  . TUBAL LIGATION      Family History  Problem Relation Age of Onset  . Heart disease Mother   . Heart attack Mother   . Breast cancer Sister   . Alcohol abuse Father   . Cerebrovascular Accident Father   . Arthritis Father   . Hypertension Father   . Hypertension Other   . Diabetes Other   . Breast cancer Sister 36  . Heart attack Other 41  . Cancer Sister     Social History   Socioeconomic History  . Marital status: Married    Spouse name: Not on file  . Number of children: 2  . Years of education: Not on file  . Highest education level: Some college, no degree  Occupational History  . Not on file  Social Needs  . Financial resource strain: Not hard at all  .  Food insecurity:    Worry: Never true    Inability: Never true  . Transportation needs:    Medical: No    Non-medical: No  Tobacco Use  . Smoking status: Never Smoker  . Smokeless tobacco: Never Used  Substance and Sexual Activity  . Alcohol use: No    Alcohol/week: 0.0 standard drinks  . Drug use: No  . Sexual activity: Yes    Partners: Male    Birth control/protection: None  Lifestyle  . Physical activity:    Days per week: 2 days    Minutes per session: 40 min  . Stress: Only a little  Relationships  . Social connections:    Talks on phone: More than three times a week    Gets together: Three times a week    Attends religious service: More  than 4 times per year    Active member of club or organization: No    Attends meetings of clubs or organizations: Never    Relationship status: Married  . Intimate partner violence:    Fear of current or ex partner: No    Emotionally abused: No    Physically abused: No    Forced sexual activity: No  Other Topics Concern  . Not on file  Social History Narrative   Married. One son and one daughter. She is disabled after she had breast cancer and strokes. 2 caffeinated beverages daily.     Current Outpatient Medications:  .  alendronate (FOSAMAX) 70 MG tablet, TAKE 1 TABLET BY MOUTH ONCE A WEEK. TAKE WITH A FULL  GLASS OF WATER ON AN EMPTY  STOMACH., Disp: 12 tablet, Rfl: 3 .  atorvastatin (LIPITOR) 40 MG tablet, Take 1 tablet (40 mg total) by mouth daily., Disp: 90 tablet, Rfl: 1 .  azaTHIOprine (IMURAN) 50 MG tablet, Take 1 tablet (50 mg total) by mouth daily., Disp: 90 tablet, Rfl: 0 .  diclofenac sodium (VOLTAREN) 1 % GEL, Apply 2 g topically 4 (four) times daily., Disp: 100 g, Rfl: 0 .  gabapentin (NEURONTIN) 300 MG capsule, Take 1 capsule (300 mg total) by mouth at bedtime., Disp: 90 capsule, Rfl: 0 .  hydrochlorothiazide (HYDRODIURIL) 12.5 MG tablet, Take 1 tablet (12.5 mg total) by mouth daily., Disp: 90 tablet, Rfl: 1 .  latanoprost (XALATAN) 0.005 % ophthalmic solution, Place 1 drop into both eyes at bedtime. , Disp: , Rfl:  .  levothyroxine (SYNTHROID) 50 MCG tablet, Take 1 tablet (50 mcg total) by mouth daily., Disp: 90 tablet, Rfl: 0 .  Multiple Vitamin (MULTIVITAMIN) tablet, Take 1 tablet by mouth daily., Disp: , Rfl:  .  ondansetron (ZOFRAN) 4 MG tablet, Take 1 tablet (4 mg total) by mouth every 8 (eight) hours as needed for up to 14 doses for nausea or vomiting., Disp: 14 tablet, Rfl: 0 .  sertraline (ZOLOFT) 50 MG tablet, Take 0.5 tablets (25 mg total) by mouth daily., Disp: 90 tablet, Rfl: 0 .  triamcinolone (KENALOG) 0.025 % ointment, Apply 1 application topically 2 (two)  times daily., Disp: 30 g, Rfl: 0 .  valACYclovir (VALTREX) 1000 MG tablet, Take 1 tablet (1,000 mg total) by mouth 2 (two) times daily., Disp: 30 tablet, Rfl: 5  Current Facility-Administered Medications:  .  ondansetron (ZOFRAN) tablet 4 mg, 4 mg, Oral, Once, Vanga, Tally Due, MD .  promethazine (PHENERGAN) tablet 12.5 mg, 12.5 mg, Oral, Once, Vanga, Tally Due, MD  No Known Allergies  I personally reviewed active problem list, medication list, allergies, notes  from last encounter, lab results with the patient/caregiver today.   ROS Constitutional: Negative for fever or weight change.  Respiratory: Negative for cough and shortness of breath.   Cardiovascular: Negative for chest pain or palpitations.  Gastrointestinal: Negative for abdominal pain, no bowel changes.  Musculoskeletal: Negative for gait problem or joint swelling.  Skin: Negative for rash.  Neurological: Negative for dizziness or headache.  No other specific complaints in a complete review of systems (except as listed in HPI above).  Objective  Virtual encounter, vitals not obtained.  There is no height or weight on file to calculate BMI.  Physical Exam Constitutional: Patient appears well-developed and well-nourished. No distress.  HENT: Head: Normocephalic and atraumatic.  Neck: Normal range of motion. Pulmonary/Chest: Effort normal. No respiratory distress. Speaking in complete sentences Neurological: Pt is alert and oriented to person, place, and time. Coordination, speech and gait are normal.  Psychiatric: Patient has a normal mood and affect. behavior is normal. Judgment and thought content normal.  No results found for this or any previous visit (from the past 72 hour(s)).  PHQ2/9: Depression screen [facility] 2/9 02/16/2019 11/08/2018 11/02/2018 10/18/2018 07/19/2018  Decreased Interest 0 0 0 0 0  Down, Depressed, Hopeless 0 0 0 0 0  PHQ - 2 Score 0 0 0 0 0  Altered sleeping 0 - 0 0 0  Tired, decreased  energy 1 1 1  0 0  Change in appetite 0 0 2 0 0  Feeling bad or failure about yourself  0 0 0 0 0  Trouble concentrating 0 0 0 0 0  Moving slowly or fidgety/restless 0 0 0 0 0  Suicidal thoughts 0 0 0 0 0  PHQ-9 Score 1 - 3 0 0  Difficult doing work/chores Not difficult at all Not difficult at all Not difficult at all Not difficult at all Not difficult at all  Some recent data might be hidden   PHQ-2/9 Result is negative.    Fall Risk: Fall Risk  02/16/2019 11/08/2018 11/02/2018 10/18/2018 07/19/2018  Falls in the past year? 0 0 0 0 Yes  Number falls in past yr: 0 0 0 0 1  Injury with Fall? 0 0 0 0 No  Follow up Falls evaluation completed - Falls prevention discussed Falls evaluation completed -    Assessment & Plan  1. Benign essential HTN - Slightly elevated today, but review of her home BP's shows overall controlled BP trend.  Remain on HCTZ alone for the time being.  2. Varicose veins of both lower extremities with pain Encouraged her to wear compression stockings throughout the day and remove at night.  3. Atherosclerosis of aorta ([place]) - Due for lipid check at next visit; on statin.  4. CVA, old, hemiparesis ([place]) - Symptoms are stable, no ASA or anticoagulation due to Crohn's disease.  5. Hypercholesterolemia without hypertriglyceridemia - Due for lipid check at next visit, on statin  6. Crohn's disease of small intestine without complication (HCC) - Seeing Dr. Marius Ditch.  I did send message to Dr. Marius Ditch regarding patient's report of new onset BRB on toilet tissue.    7. Immunosuppressed status ([place]) - Will avoid bringing patient in to office for labs at present due to COVID-19 pandemic.  It is agreed that it is safer to wait until next visit.  8. Gastroesophageal reflux disease, esophagitis presence not specified - Stable  9. Adult hypothyroidism - Due for labs at next check, has been stable for a few years on same dose.  10. H/O malignant neoplasm of breast - No new  concerns. Doing well  11. Chronic neck pain - Advised to increase gabapentin to QHS instead of QHS PRN.  12. Arthritis of right hand - Doing well with Voltaren  13. Fever blister - No concerns today, ValtrexPRN  14. Osteopenia, unspecified location - Due in November for DEXA; on Fossamax  15. Vitamin D deficiency - Advised to take supplement.  16. Major depression in remission ([place]) - Advised may increase to 50mg  again as she has been feeling down lately.  I discussed the assessment and treatment plan with the patient. The patient was provided an opportunity to ask questions and all were answered. The patient agreed with the plan and demonstrated an understanding of the instructions.  The patient was advised to call back or seek an in-person evaluation if the symptoms worsen or if the condition fails to improve as anticipated.  I provided 35 minutes of non-face-to-face time during this encounter.

## 2019-02-19 MED ORDER — AZATHIOPRINE 50 MG PO TABS: 50.0000 mg | ORAL_TABLET | Freq: Every day | ORAL | 0 refills | Status: DC

## 2019-03-05 ENCOUNTER — Other Ambulatory Visit: Payer: Self-pay

## 2019-03-05 ENCOUNTER — Encounter
Admission: RE | Admit: 2019-03-05 | Discharge: 2019-03-05 | Disposition: A | Discharge status: Home / Self Care | Payer: Medicare Other | Source: Ambulatory Visit | Attending: Gastroenterology | Admitting: Gastroenterology

## 2019-03-05 DIAGNOSIS — Z1159 Encounter for screening for other viral diseases: Secondary | ICD-10-CM | POA: Diagnosis not present

## 2019-03-05 DIAGNOSIS — D689 Coagulation defect, unspecified: Secondary | ICD-10-CM | POA: Diagnosis not present

## 2019-03-05 DIAGNOSIS — Z7983 Long term (current) use of bisphosphonates: Secondary | ICD-10-CM | POA: Diagnosis not present

## 2019-03-05 DIAGNOSIS — R1013 Epigastric pain: Secondary | ICD-10-CM | POA: Diagnosis present

## 2019-03-05 DIAGNOSIS — I10 Essential (primary) hypertension: Secondary | ICD-10-CM | POA: Diagnosis not present

## 2019-03-05 DIAGNOSIS — E039 Hypothyroidism, unspecified: Secondary | ICD-10-CM | POA: Diagnosis not present

## 2019-03-05 DIAGNOSIS — Z79899 Other long term (current) drug therapy: Secondary | ICD-10-CM | POA: Diagnosis not present

## 2019-03-05 DIAGNOSIS — Z79890 Hormone replacement therapy (postmenopausal): Secondary | ICD-10-CM | POA: Diagnosis not present

## 2019-03-05 DIAGNOSIS — M1990 Unspecified osteoarthritis, unspecified site: Secondary | ICD-10-CM | POA: Diagnosis not present

## 2019-03-05 DIAGNOSIS — K5090 Crohn's disease, unspecified, without complications: Secondary | ICD-10-CM | POA: Diagnosis not present

## 2019-03-05 DIAGNOSIS — K449 Diaphragmatic hernia without obstruction or gangrene: Secondary | ICD-10-CM | POA: Diagnosis not present

## 2019-03-05 DIAGNOSIS — K259 Gastric ulcer, unspecified as acute or chronic, without hemorrhage or perforation: Secondary | ICD-10-CM | POA: Diagnosis not present

## 2019-03-05 DIAGNOSIS — Z01812 Encounter for preprocedural laboratory examination: Secondary | ICD-10-CM | POA: Insufficient documentation

## 2019-03-05 DIAGNOSIS — F329 Major depressive disorder, single episode, unspecified: Secondary | ICD-10-CM | POA: Diagnosis not present

## 2019-03-05 DIAGNOSIS — E785 Hyperlipidemia, unspecified: Secondary | ICD-10-CM | POA: Diagnosis not present

## 2019-03-05 DIAGNOSIS — Z853 Personal history of malignant neoplasm of breast: Secondary | ICD-10-CM | POA: Diagnosis not present

## 2019-03-05 DIAGNOSIS — M810 Age-related osteoporosis without current pathological fracture: Secondary | ICD-10-CM | POA: Diagnosis not present

## 2019-03-05 DIAGNOSIS — Z923 Personal history of irradiation: Secondary | ICD-10-CM | POA: Diagnosis not present

## 2019-03-05 DIAGNOSIS — H409 Unspecified glaucoma: Secondary | ICD-10-CM | POA: Diagnosis not present

## 2019-03-05 DIAGNOSIS — I69322 Dysarthria following cerebral infarction: Secondary | ICD-10-CM | POA: Diagnosis not present

## 2019-03-05 DIAGNOSIS — K317 Polyp of stomach and duodenum: Secondary | ICD-10-CM | POA: Diagnosis not present

## 2019-03-05 DIAGNOSIS — J449 Chronic obstructive pulmonary disease, unspecified: Secondary | ICD-10-CM | POA: Diagnosis not present

## 2019-03-06 LAB — NOVEL CORONAVIRUS, NAA (HOSP ORDER, SEND-OUT TO REF LAB; TAT 18-24 HRS): SARS-CoV-2, NAA: NOT DETECTED

## 2019-03-07 ENCOUNTER — Encounter: Payer: Self-pay | Admitting: *Deleted

## 2019-03-08 ENCOUNTER — Encounter: Payer: Self-pay | Admitting: Emergency Medicine

## 2019-03-08 ENCOUNTER — Ambulatory Visit: Payer: Medicare Other | Admitting: Certified Registered"

## 2019-03-08 ENCOUNTER — Encounter
Admission: RE | Disposition: A | Discharge status: Home / Self Care | Payer: Self-pay | Source: Home / Self Care | Attending: Gastroenterology

## 2019-03-08 ENCOUNTER — Other Ambulatory Visit: Payer: Self-pay

## 2019-03-08 ENCOUNTER — Ambulatory Visit
Admission: RE | Admit: 2019-03-08 | Discharge: 2019-03-08 | Disposition: A | Discharge status: Home / Self Care | Payer: Medicare Other | Attending: Gastroenterology | Admitting: Gastroenterology

## 2019-03-08 DIAGNOSIS — K3189 Other diseases of stomach and duodenum: Secondary | ICD-10-CM | POA: Diagnosis not present

## 2019-03-08 DIAGNOSIS — K317 Polyp of stomach and duodenum: Secondary | ICD-10-CM | POA: Insufficient documentation

## 2019-03-08 DIAGNOSIS — E785 Hyperlipidemia, unspecified: Secondary | ICD-10-CM | POA: Insufficient documentation

## 2019-03-08 DIAGNOSIS — K5080 Crohn's disease of both small and large intestine without complications: Secondary | ICD-10-CM

## 2019-03-08 DIAGNOSIS — Z7983 Long term (current) use of bisphosphonates: Secondary | ICD-10-CM | POA: Insufficient documentation

## 2019-03-08 DIAGNOSIS — Z923 Personal history of irradiation: Secondary | ICD-10-CM | POA: Insufficient documentation

## 2019-03-08 DIAGNOSIS — R1013 Epigastric pain: Secondary | ICD-10-CM | POA: Insufficient documentation

## 2019-03-08 DIAGNOSIS — Z853 Personal history of malignant neoplasm of breast: Secondary | ICD-10-CM | POA: Insufficient documentation

## 2019-03-08 DIAGNOSIS — Z79899 Other long term (current) drug therapy: Secondary | ICD-10-CM | POA: Insufficient documentation

## 2019-03-08 DIAGNOSIS — K5090 Crohn's disease, unspecified, without complications: Secondary | ICD-10-CM | POA: Insufficient documentation

## 2019-03-08 DIAGNOSIS — Z79890 Hormone replacement therapy (postmenopausal): Secondary | ICD-10-CM | POA: Insufficient documentation

## 2019-03-08 DIAGNOSIS — E039 Hypothyroidism, unspecified: Secondary | ICD-10-CM | POA: Insufficient documentation

## 2019-03-08 DIAGNOSIS — H409 Unspecified glaucoma: Secondary | ICD-10-CM | POA: Insufficient documentation

## 2019-03-08 DIAGNOSIS — M810 Age-related osteoporosis without current pathological fracture: Secondary | ICD-10-CM | POA: Insufficient documentation

## 2019-03-08 DIAGNOSIS — F329 Major depressive disorder, single episode, unspecified: Secondary | ICD-10-CM | POA: Insufficient documentation

## 2019-03-08 DIAGNOSIS — M1990 Unspecified osteoarthritis, unspecified site: Secondary | ICD-10-CM | POA: Insufficient documentation

## 2019-03-08 DIAGNOSIS — I69322 Dysarthria following cerebral infarction: Secondary | ICD-10-CM | POA: Insufficient documentation

## 2019-03-08 DIAGNOSIS — I10 Essential (primary) hypertension: Secondary | ICD-10-CM | POA: Insufficient documentation

## 2019-03-08 DIAGNOSIS — Z1159 Encounter for screening for other viral diseases: Secondary | ICD-10-CM | POA: Insufficient documentation

## 2019-03-08 DIAGNOSIS — K922 Gastrointestinal hemorrhage, unspecified: Secondary | ICD-10-CM

## 2019-03-08 DIAGNOSIS — D689 Coagulation defect, unspecified: Secondary | ICD-10-CM | POA: Insufficient documentation

## 2019-03-08 DIAGNOSIS — K259 Gastric ulcer, unspecified as acute or chronic, without hemorrhage or perforation: Secondary | ICD-10-CM | POA: Insufficient documentation

## 2019-03-08 DIAGNOSIS — J449 Chronic obstructive pulmonary disease, unspecified: Secondary | ICD-10-CM | POA: Insufficient documentation

## 2019-03-08 DIAGNOSIS — K449 Diaphragmatic hernia without obstruction or gangrene: Secondary | ICD-10-CM | POA: Insufficient documentation

## 2019-03-08 HISTORY — PX: COLONOSCOPY WITH PROPOFOL: SHX5780

## 2019-03-08 HISTORY — PX: ESOPHAGOGASTRODUODENOSCOPY (EGD) WITH PROPOFOL: SHX5813

## 2019-03-08 SURGERY — ESOPHAGOGASTRODUODENOSCOPY (EGD) WITH PROPOFOL
Anesthesia: General

## 2019-03-08 MED ORDER — OMEPRAZOLE 40 MG PO CPDR: 40.0000 mg | DELAYED_RELEASE_CAPSULE | Freq: Every day | ORAL | 1 refills | Status: DC

## 2019-03-08 MED ORDER — PROPOFOL 10 MG/ML IV BOLUS
INTRAVENOUS | Status: DC | PRN
  Administered 2019-03-08 (×7): 20 mg via INTRAVENOUS
  Administered 2019-03-08: 50 mg via INTRAVENOUS
  Administered 2019-03-08 (×5): 20 mg via INTRAVENOUS
  Administered 2019-03-08: 50 mg via INTRAVENOUS

## 2019-03-08 MED ORDER — GLYCOPYRROLATE 0.2 MG/ML IJ SOLN
INTRAMUSCULAR | Status: DC | PRN
  Administered 2019-03-08: 0.2 mg via INTRAVENOUS

## 2019-03-08 MED ORDER — SODIUM CHLORIDE 0.9 % IV SOLN
INTRAVENOUS | Status: DC
  Administered 2019-03-08: 1000 mL via INTRAVENOUS

## 2019-03-08 NOTE — Transfer of Care (Signed)
Immediate Anesthesia Transfer of Care Note  Patient: Carolyn Campbell  Procedure(s) Performed: ESOPHAGOGASTRODUODENOSCOPY (EGD) WITH PROPOFOL (N/A ) COLONOSCOPY WITH PROPOFOL (N/A )  Patient Location: Endoscopy Unit  Anesthesia Type:General  Level of Consciousness: awake and patient cooperative  Airway & Oxygen Therapy: Patient Spontanous Breathing and Patient connected to face mask oxygen  Post-op Assessment: Report given to RN and Post -op Vital signs reviewed and stable  Post vital signs: Reviewed and stable  Last Vitals:  Vitals Value Taken Time  BP 105/64 03/08/19 0941  Temp    Pulse 83 03/08/19 0943  Resp 17 03/08/19 0943  SpO2 100 % 03/08/19 0943  Vitals shown include unvalidated device data.  Last Pain:  Vitals:   03/08/19 0824  TempSrc: Tympanic  PainSc: 0-No pain         Complications: No apparent anesthesia complications

## 2019-03-08 NOTE — Anesthesia Post-op Follow-up Note (Signed)
Anesthesia QCDR form completed.        

## 2019-03-08 NOTE — Op Note (Signed)
[facility] Gastroenterology Patient Name: Carolyn Campbell Procedure Date: 03/08/2019 8:54 AM MRN: [id number] Account #: [id number] Date of Birth: 03-01-1956 Admit Type: Outpatient Age: 63 Room: [facility] ENDO ROOM 4 Gender: Female Note Status: Finalized Procedure:            Upper GI endoscopy Indications:          Dyspepsia, Crohn's disease Providers:            Lin Landsman MD, MD Medicines:            Monitored Anesthesia Care Complications:        No immediate complications. Estimated blood loss: None. Procedure:            Pre-Anesthesia Assessment:                       - Prior to the procedure, a History and Physical was                        performed, and patient medications and allergies were                        reviewed. The patient is competent. The risks and                        benefits of the procedure and the sedation options and                        risks were discussed with the patient. All questions                        were answered and informed consent was obtained.                        Patient identification and proposed procedure were                        verified by the physician, the nurse, the                        anesthesiologist, the anesthetist and the technician in                        the pre-procedure area in the procedure room in the                        endoscopy suite. Mental Status Examination: alert and                        oriented. Airway Examination: normal oropharyngeal                        airway and neck mobility. Respiratory Examination:                        clear to auscultation. CV Examination: normal.                        Prophylactic Antibiotics: The patient does not require  prophylactic antibiotics. Prior Anticoagulants: The                        patient has taken no previous anticoagulant or                        antiplatelet agents. ASA Grade Assessment: III - A                        patient with severe systemic disease. After reviewing                        the risks and benefits, the patient was deemed in                        satisfactory condition to undergo the procedure. The                        anesthesia plan was to use monitored anesthesia care                        (MAC). Immediately prior to administration of                        medications, the patient was re-assessed for adequacy                        to receive sedatives. The heart rate, respiratory rate,                        oxygen saturations, blood pressure, adequacy of                        pulmonary ventilation, and response to care were                        monitored throughout the procedure. The physical status                        of the patient was re-assessed after the procedure.                       After obtaining informed consent, the endoscope was                        passed under direct vision. Throughout the procedure,                        the patient's blood pressure, pulse, and oxygen                        saturations were monitored continuously. The Endoscope                        was introduced through the mouth, and advanced to the                        second part of duodenum. The upper GI endoscopy was  accomplished without difficulty. The patient tolerated                        the procedure well. Findings:      A single 6 mm sessile polyp with no bleeding was found in the second       portion of the duodenum. This was biopsied with a cold forceps for       histology.      A single localized, 5 mm non-bleeding erosion was found in the gastric       antrum. There were stigmata of recent bleeding.      The gastric fundus and gastric body were normal. Biopsies were taken       with a cold forceps for Helicobacter pylori testing.      The cardia and gastric fundus were normal on retroflexion.      A small hiatal  hernia was present.      The gastroesophageal junction and examined esophagus were normal. Impression:           - A single duodenal polyp. Biopsied.                       - Recently bleeding erosive gastropathy.                       - Normal gastric fundus and gastric body. Biopsied.                       - Small hiatal hernia.                       - Normal gastroesophageal junction and esophagus. Recommendation:       - Await pathology results.                       - Proceed with colonoscopy as scheduled                       See colonoscopy report Procedure Code(s):    --- Professional ---                       [phone], Esophagogastroduodenoscopy, flexible, transoral;                        with biopsy, single or multiple Diagnosis Code(s):    --- Professional ---                       K31.7, Polyp of stomach and duodenum                       K31.89, Other diseases of stomach and duodenum                       K92.2, Gastrointestinal hemorrhage, unspecified                       K44.9, Diaphragmatic hernia without obstruction or                        gangrene                       R10.13, Epigastric pain  K50.90, Crohn's disease, unspecified, without                        complications CPT copyright 2019 [employer]. All rights reserved. The codes documented in this report are preliminary and upon coder review may  be revised to meet current compliance requirements. Dr. Ulyess Mort Lin Landsman MD, MD 03/08/2019 9:22:06 AM This report has been signed electronically. Number of Addenda: 0 Note Initiated On: 03/08/2019 8:54 AM Estimated Blood Loss: Estimated blood loss: none.      [facility]

## 2019-03-08 NOTE — H&P (Signed)
[name], MD [address]  [place]  [place], [place] [address]  Main: [phone]  Fax: [phone] Pager: [phone]  Primary Care Physician:  Hubbard Hartshorn, FNP Primary Gastroenterologist:  Dr. [name]  Pre-Procedure History & Physical: HPI:  SHEREKA LAFORTUNE is a 63 y.o. female is here for an endoscopy and colonoscopy.   Past Medical History:  Diagnosis Date  . Allergic rhinitis   . Anginal pain ([place])    states MD said it was GERD  . Arthritis   . Asthma   . Breast cancer ([place]) 2006   LT LUMPECTOMY  . CD (Crohn's disease) ([place]) 05/21/2015   FOLLOWED BY GI   . Clotting disorder ([place])   . Cognitive deficits as late effect of cerebrovascular disease   . COPD (chronic obstructive pulmonary disease) ([place])   . Crohn's disease of both small and large intestine with rectal bleeding ([place]) 12/04/2014  . Depression    Currently taking zoloft.  . 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 Dyspnea   . Esophageal reflux   . Fever blister 07/19/2018  . Glaucoma    vitreous degeneration  . History of kidney stones   . Hyperlipidemia   . Hypertension   . Hypothyroidism   . Occlusion, cerebral artery    NOS w/infarction  . Osteoporosis   . Personal history of radiation therapy 2006   BREAST CA  . Stroke ([place])   . Ulcer     Past Surgical History:  Procedure Laterality Date  . BREAST BIOPSY Left 2006   POS  . BREAST LUMPECTOMY Left 09/20/2004   positive  . BREAST SURGERY Left    malignant biopsy  . CARDIAC CATHETERIZATION    . COLONOSCOPY  2015  . COLONOSCOPY WITH PROPOFOL N/A 06/08/2017   Procedure: COLONOSCOPY WITH PROPOFOL;  Surgeon: Lin Landsman, MD;  Location: [facility];  Service: Gastroenterology;  Laterality: N/A;  . ESOPHAGOGASTRODUODENOSCOPY  2015  . ESOPHAGOGASTRODUODENOSCOPY (EGD) WITH PROPOFOL N/A 06/08/2017   Procedure: ESOPHAGOGASTRODUODENOSCOPY (EGD) WITH PROPOFOL;  Surgeon: Lin Landsman, MD;   Location: [facility];  Service: Gastroenterology;  Laterality: N/A;  . FINGER SURGERY     Right small finger  . FRACTURE SURGERY    . [place] STUDY  2015  . TUBAL LIGATION      Prior to Admission medications   Medication Sig Start Date End Date Taking? Authorizing Provider  alendronate (FOSAMAX) 70 MG tablet TAKE 1 TABLET BY MOUTH ONCE A WEEK. TAKE WITH A FULL  GLASS OF WATER ON AN EMPTY  STOMACH. 09/21/18  Yes Hubbard Hartshorn, FNP  atorvastatin (LIPITOR) 40 MG tablet Take 1 tablet (40 mg total) by mouth daily. 12/01/18  Yes Hubbard Hartshorn, FNP  azaTHIOprine (IMURAN) 50 MG tablet Take 1 tablet (50 mg total) by mouth daily. 02/19/19 05/20/19 Yes Vanga, Tally Due, MD  diclofenac sodium (VOLTAREN) 1 % GEL Apply 2 g topically 4 (four) times daily. 05/18/18  Yes Hubbard Hartshorn, FNP  gabapentin (NEURONTIN) 300 MG capsule Take 1 capsule (300 mg total) by mouth at bedtime. 09/19/18  Yes Hubbard Hartshorn, FNP  hydrochlorothiazide (HYDRODIURIL) 12.5 MG tablet Take 1 tablet (12.5 mg total) by mouth daily. 09/21/18  Yes Hubbard Hartshorn, FNP  latanoprost (XALATAN) 0.005 % ophthalmic solution Place 1 drop into both eyes at bedtime.  04/22/16  Yes [provider]  levothyroxine (SYNTHROID) 50 MCG tablet Take 1 tablet (50 mcg total) by  mouth daily. 02/05/19  Yes Hubbard Hartshorn, FNP  Multiple Vitamin (MULTIVITAMIN) tablet Take 1 tablet by mouth daily.   Yes [provider]  ondansetron (ZOFRAN) 4 MG tablet Take 1 tablet (4 mg total) by mouth every 8 (eight) hours as needed for up to 14 doses for nausea or vomiting. 11/06/18  Yes Vanga, Tally Due, MD  sertraline (ZOLOFT) 50 MG tablet Take 0.5 tablets (25 mg total) by mouth daily. 09/19/18  Yes Hubbard Hartshorn, FNP  triamcinolone (KENALOG) 0.025 % ointment Apply 1 application topically 2 (two) times daily. 10/18/18  Yes Hubbard Hartshorn, FNP  valACYclovir (VALTREX) 1000 MG tablet Take 1 tablet (1,000 mg total) by mouth 2 (two) times daily.  03/28/18  Yes Fredderick Severance, NP    Allergies as of 01/16/2019  . (No Known Allergies)    Family History  Problem Relation Age of Onset  . Heart disease Mother   . Heart attack Mother   . Breast cancer Sister   . Alcohol abuse Father   . Cerebrovascular Accident Father   . Arthritis Father   . Hypertension Father   . Hypertension Other   . Diabetes Other   . Breast cancer Sister 71  . Heart attack Other 41  . Cancer Sister     Social History   Socioeconomic History  . Marital status: Married    Spouse name: Not on file  . Number of children: 2  . Years of education: Not on file  . Highest education level: Some college, no degree  Occupational History  . Not on file  Social Needs  . Financial resource strain: Not hard at all  . Food insecurity    Worry: Never true    Inability: Never true  . Transportation needs    Medical: No    Non-medical: No  Tobacco Use  . Smoking status: Never Smoker  . Smokeless tobacco: Never Used  Substance and Sexual Activity  . Alcohol use: No    Alcohol/week: 0.0 standard drinks  . Drug use: No  . Sexual activity: Yes    Partners: Male    Birth control/protection: None  Lifestyle  . Physical activity    Days per week: 2 days    Minutes per session: 40 min  . Stress: Only a little  Relationships  . Social connections    Talks on phone: More than three times a week    Gets together: Three times a week    Attends religious service: More than 4 times per year    Active member of club or organization: No    Attends meetings of clubs or organizations: Never    Relationship status: Married  . Intimate partner violence    Fear of current or ex partner: No    Emotionally abused: No    Physically abused: No    Forced sexual activity: No  Other Topics Concern  . Not on file  Social History Narrative   Married. One son and one daughter. She is disabled after she had breast cancer and strokes. 2 caffeinated beverages daily.     Review of Systems: See HPI, otherwise negative ROS  Physical Exam: BP (!) 151/62   Pulse 68   Temp (!) 97.2 F (36.2 C) (Tympanic)   Resp 16   Ht 5' (1.524 m)   Wt 140 lb (63.5 kg)   SpO2 100%   BMI 27.34 kg/m  General:   Alert,  pleasant and cooperative in NAD Head:  Normocephalic and atraumatic. Neck:  Supple; no masses or thyromegaly. Lungs:  Clear throughout to auscultation.    Heart:  Regular rate and rhythm. Abdomen:  Soft, nontender and nondistended. Normal bowel sounds, without guarding, and without rebound.   Neurologic:  Alert and  oriented x4;  grossly normal neurologically.  Impression/Plan: SUNDEE GARLAND is here for an endoscopy and colonoscopy to be performed for crohn's flare, epigastric pain  Risks, benefits, limitations, and alternatives regarding  endoscopy and colonoscopy have been reviewed with the patient.  Questions have been answered.  All parties agreeable.   [name], MD  03/08/2019, 8:51 AM

## 2019-03-08 NOTE — Op Note (Signed)
[facility] Gastroenterology Patient Name: Carolyn Campbell Procedure Date: 03/08/2019 8:54 AM MRN: [id number] Account #: [id number] Date of Birth: 25-Sep-1955 Admit Type: Outpatient Age: 63 Room: [facility] ENDO ROOM 4 Gender: Female Note Status: Finalized Procedure:            Colonoscopy Indications:          Crohn's disease of the small bowel and colon, Disease                        activity assessment of Crohn's disease of the small                        bowel and colon Providers:            Lin Landsman MD, MD Medicines:            Monitored Anesthesia Care Complications:        No immediate complications. Estimated blood loss:                        Minimal. Procedure:            Pre-Anesthesia Assessment:                       - Prior to the procedure, a History and Physical was                        performed, and patient medications and allergies were                        reviewed. The patient is competent. The risks and                        benefits of the procedure and the sedation options and                        risks were discussed with the patient. All questions                        were answered and informed consent was obtained.                        Patient identification and proposed procedure were                        verified by the physician, the nurse, the                        anesthesiologist, the anesthetist and the technician in                        the pre-procedure area in the procedure room in the                        endoscopy suite. Mental Status Examination: alert and                        oriented. Airway Examination: normal oropharyngeal                        airway  and neck mobility. Respiratory Examination:                        clear to auscultation. CV Examination: normal.                        Prophylactic Antibiotics: The patient does not require                        prophylactic antibiotics. Prior  Anticoagulants: The                        patient has taken no previous anticoagulant or                        antiplatelet agents. ASA Grade Assessment: III - A                        patient with severe systemic disease. After reviewing                        the risks and benefits, the patient was deemed in                        satisfactory condition to undergo the procedure. The                        anesthesia plan was to use monitored anesthesia care                        (MAC). Immediately prior to administration of                        medications, the patient was re-assessed for adequacy                        to receive sedatives. The heart rate, respiratory rate,                        oxygen saturations, blood pressure, adequacy of                        pulmonary ventilation, and response to care were                        monitored throughout the procedure. The physical status                        of the patient was re-assessed after the procedure.                       After obtaining informed consent, the colonoscope was                        passed under direct vision. Throughout the procedure,                        the patient's blood pressure, pulse, and oxygen                        saturations  were monitored continuously. The                        Colonoscope was introduced through the anus and                        advanced to the the terminal ileum, with identification                        of the appendiceal orifice and IC valve. The                        colonoscopy was performed without difficulty. The                        patient tolerated the procedure well. The quality of                        the bowel preparation was evaluated using the BBPS                        [facility] Bowel Preparation Scale) with scores of: Right                        Colon = 3, Transverse Colon = 3 and Left Colon = 3                        (entire mucosa seen well  with no residual staining,                        small fragments of stool or opaque liquid). The total                        BBPS score equals 9. Findings:      The perianal and digital rectal examinations were normal. Pertinent       negatives include normal sphincter tone and no palpable rectal lesions.      The terminal ileum appeared normal. Biopsies were taken with a cold       forceps for histology.      The Simple Endoscopic Score for Crohn's Disease was determined based on       the endoscopic appearance of the mucosa in the following segments:      - Ileum: Findings include no ulcers present, no ulcerated surfaces, no       affected surfaces and no narrowings. Segment score: 0.      - Right Colon: Findings include aphthous ulcers less than 0.5 cm in       size, less than 10% ulcerated surfaces, less than 50% of surfaces       affected and no narrowings. Segment score: 3.      - Transverse Colon: Findings include no ulcers present, no ulcerated       surfaces, no affected surfaces and no narrowings. Segment score: 0.      - Left Colon: Findings include no ulcers present, no ulcerated surfaces,       no affected surfaces and no narrowings. Segment score: 0.      - Rectum: Findings include aphthous ulcers less than 0.5 cm in size,       less than 10% ulcerated surfaces, 50-75%  of surfaces affected and no       narrowings. Segment score: 4.      - Total SES-CD aggregate score: 7. Biopsies were taken with a cold       forceps for histology. Impression:           - The examined portion of the ileum was normal.                        Biopsied.                       - Simple Endoscopic Score for Crohn's Disease: 7,                        mucosal inflammatory changes secondary to Crohn's                        disease with colonic involvement. Biopsied. Recommendation:       - Discharge patient to home (with escort).                       - Resume regular diet today.                        - Continue present medications.                       - Await pathology results.                       - Return to my office as previously scheduled.                       - Recommend humira or entyvio, will discuss with patient Procedure Code(s):    --- Professional ---                       [phone], Colonoscopy, flexible; with biopsy, single or                        multiple Diagnosis Code(s):    --- Professional ---                       K50.10, Crohn's disease of large intestine without                        complications                       K50.80, Crohn's disease of both small and large                        intestine without complications CPT copyright 2019 American Medical Association. All rights reserved. The codes documented in this report are preliminary and upon coder review may  be revised to meet current compliance requirements. Dr. Ulyess Mort Lin Landsman MD, MD 03/08/2019 9:42:00 AM This report has been signed electronically. Number of Addenda: 0 Note Initiated On: 03/08/2019 8:54 AM Scope Withdrawal Time: 0 hours 10 minutes 36 seconds  Total Procedure Duration: 0 hours 13 minutes 46 seconds  Estimated Blood Loss: Estimated blood loss was minimal.      [facility]

## 2019-03-08 NOTE — Anesthesia Preprocedure Evaluation (Addendum)
Anesthesia Evaluation  Patient identified by MRN, date of birth, ID band Patient awake    Reviewed: Allergy & Precautions, H&P , NPO status , Patient's Chart, lab work & pertinent test results, reviewed documented beta blocker date and time   Airway Mallampati: II  TM Distance: >3 FB Neck ROM: full    Dental no notable dental hx.    Pulmonary neg pulmonary ROS, shortness of breath, asthma , COPD,    Pulmonary exam normal breath sounds clear to auscultation       Cardiovascular Exercise Tolerance: Good hypertension, + angina negative cardio ROS   Rhythm:regular Rate:Normal  Recently had episode of chest pain.  ER visit with normal EKG and troponin, thought to be reflux related.   Neuro/Psych PSYCHIATRIC DISORDERS Depression CVA negative neurological ROS     GI/Hepatic negative GI ROS, Neg liver ROS, PUD, GERD  ,  Endo/Other  negative endocrine ROSHypothyroidism   Renal/GU negative Renal ROS  negative genitourinary   Musculoskeletal  (+) Arthritis ,   Abdominal   Peds  Hematology negative hematology ROS (+)   Anesthesia Other Findings Past Medical History: No date: Allergic rhinitis No date: Anginal pain ([place])     Comment:  states MD said it was GERD No date: Arthritis No date: Asthma 2006: Breast cancer ([place])     Comment:  LT LUMPECTOMY 05/21/2015: CD (Crohn's disease) ([place])     Comment:  FOLLOWED BY GI  No date: Clotting disorder ([place]) No date: Cognitive deficits as late effect of cerebrovascular disease No date: COPD (chronic obstructive pulmonary disease) ([place]) 12/04/2014: Crohn's disease of both small and large intestine with  rectal bleeding ([place]) No date: Depression     Comment:  Currently taking zoloft. 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 No date: Dyspnea No date: Esophageal reflux 07/19/2018: Fever blister No date: Glaucoma     Comment:  vitreous degeneration No date:  History of kidney stones No date: Hyperlipidemia No date: Hypertension No date: Hypothyroidism No date: Occlusion, cerebral artery     Comment:  NOS w/infarction No date: Osteoporosis 2006: Personal history of radiation therapy     Comment:  BREAST CA No date: Stroke ([place]) No date: Ulcer  Reproductive/Obstetrics negative OB ROS                                                              Anesthesia Evaluation    Airway        Dental   Pulmonary           Cardiovascular hypertension,   Recently had episode of chest pain.  ER visit with normal EKG and troponin, thought to be reflux related.   Neuro/Psych    GI/Hepatic   Endo/Other    Renal/GU      Musculoskeletal   Abdominal   Peds  Hematology   Anesthesia Other Findings   Reproductive/Obstetrics                             Anesthesia Physical Anesthesia Plan Anesthesia Quick Evaluation  Anesthesia Physical  Anesthesia Plan  ASA: III  Anesthesia Plan: General   Post-op Pain Management:    Induction:   PONV Risk Score and Plan:   Airway Management Planned:  Nasal Cannula  Additional Equipment:   Intra-op Plan:   Post-operative Plan:   Informed Consent: I have reviewed the patients History and Physical, chart, labs and discussed the procedure including the risks, benefits and alternatives for the proposed anesthesia with the patient or authorized representative who has indicated his/her understanding and acceptance.     Dental Advisory Given  Plan Discussed with: CRNA  Anesthesia Plan Comments:        Anesthesia Quick Evaluation

## 2019-03-09 ENCOUNTER — Encounter: Payer: Self-pay | Admitting: Gastroenterology

## 2019-03-09 LAB — SURGICAL PATHOLOGY

## 2019-03-09 NOTE — Anesthesia Postprocedure Evaluation (Signed)
Anesthesia Post Note  Patient: Carolyn Campbell  Procedure(s) Performed: ESOPHAGOGASTRODUODENOSCOPY (EGD) WITH PROPOFOL (N/A ) COLONOSCOPY WITH PROPOFOL (N/A )  Patient location during evaluation: Endoscopy Anesthesia Type: General Level of consciousness: awake and alert and oriented Pain management: pain level controlled Vital Signs Assessment: post-procedure vital signs reviewed and stable Respiratory status: spontaneous breathing Cardiovascular status: blood pressure returned to baseline Anesthetic complications: no     Last Vitals:  Vitals:   03/08/19 1021 03/08/19 1031  BP: 121/64 (!) 127/57  Pulse: 61 (!) 59  Resp: 11 14  Temp:    SpO2: 100% 100%    Last Pain:  Vitals:   03/09/19 0713  TempSrc:   PainSc: 0-No pain                 Ashton Sabine

## 2019-03-12 ENCOUNTER — Encounter: Payer: Self-pay | Admitting: Gastroenterology

## 2019-03-12 ENCOUNTER — Telehealth: Payer: Self-pay | Admitting: Gastroenterology

## 2019-03-12 NOTE — Telephone Encounter (Signed)
Patient called & states her chron's is active & wanted to know if it's ok to go to the dentist 03-12-19?

## 2019-03-12 NOTE — Telephone Encounter (Signed)
Yes, she can go to dentist  [facility]

## 2019-03-13 ENCOUNTER — Telehealth: Payer: Self-pay

## 2019-03-13 NOTE — Telephone Encounter (Signed)
Patient has been advised that she may go to the dentist.  She said she would like to restart Remicade not Humira, as she has never been on Humira.  Please advise and route to [facility].  Thanks  [employer]

## 2019-03-27 ENCOUNTER — Ambulatory Visit (INDEPENDENT_AMBULATORY_CARE_PROVIDER_SITE_OTHER): Payer: Medicare Other | Admitting: Gastroenterology

## 2019-03-27 ENCOUNTER — Other Ambulatory Visit: Payer: Self-pay

## 2019-03-27 ENCOUNTER — Encounter: Payer: Self-pay | Admitting: Gastroenterology

## 2019-03-27 ENCOUNTER — Telehealth: Payer: Self-pay | Admitting: Family Medicine

## 2019-03-27 VITALS — BP 132/73 | HR 72 | Temp 98.2°F | Resp 17 | Ht 60.0 in | Wt 140.0 lb

## 2019-03-27 DIAGNOSIS — Z23 Encounter for immunization: Secondary | ICD-10-CM

## 2019-03-27 DIAGNOSIS — K50811 Crohn's disease of both small and large intestine with rectal bleeding: Secondary | ICD-10-CM | POA: Diagnosis not present

## 2019-03-27 NOTE — Progress Notes (Signed)
[name], MD [address]  [place]  [place], [place] [address]  Main: [phone]  Fax: [phone]    Gastroenterology Consultation  Referring Provider:     Hubbard Hartshorn, FNP Primary Care Physician:  Hubbard Hartshorn, FNP Primary Gastroenterologist:  Dr. [name] Reason for Consultation: Crohn's Disease        HPI:   Carolyn Campbell is a 63 y.o. African-American female referred by Dr. Uvaldo Rising, Astrid Divine, FNP  for consultation & management of Crohn's disease. This was diagnosed in 2015, currently on Imuran 50 mg daily. She reports intermittent flareups of her Crohn's disease such as abdominal pain, bloating, rectal bleeding. She received prednisone for flare up. She had 2 flareups within last 1 year. She recently is suffering with constipation, had to take laxatives to have a BM. Her last BM was 3 days ago. She does report some upper abdominal bloating. She denies any weight loss, rectal bleeding, diarrhea, nausea, vomiting. She is not on any prednisone currently. She denies taking NSAIDs.  Follow up visit 10/24/18 She thinks she has flare up of crohn's as she has mild abdominal discomfort that limited her PO intake. She continues to have atypical chest discomfort for which she is taking tums. She mentioned about chest pain during last visit, I urged her to see her PCP which she did not yet  Follow-up visit 11/06/2018 Since last visit, her symptoms worsened.  Therefore, patient made an urgent visit to see me today.  She reports upper abdominal pain limiting her p.o. intake, results in nausea, describes it as burning.  She is taking Tums and Prilosec as needed which provides some relief. She denies diarrhea.  She lost about 6 pounds in last 3 weeks.  She denies fever, chills.  She is accompanied by her husband today.  I was planning to start her on immunomodulator, she underwent screening for TB and hepatitis B, which were negative.  TPMT levels were normal.  Follow-up visit  11/20/2018 She reports feeling significantly better, reports mild pain in the epigastric region.  She started taking azathioprine 50 mg daily.  She tried prednisone 40 mg for 2 days then developed itching associated with rash in her hands, she felt itching might be secondary to prednisone so stopped taking it.  Rash currently resolved.  Eating well.  CT abdomen and pelvis revealed possible cystitis, UA showed leukocytes and she was given ceftriaxone at her PCPs office and given Bactrim twice daily for 5 days.  Urine cultures are negative  Follow-up visit 03/27/2019 Patient underwent EGD and colonoscopy.  EGD was unremarkable, colonoscopy revealed mild to moderate active colitis.  Patient continues to have mild symptoms of abdominal discomfort, blood mixed with stool and on wiping.  She denies diarrhea, weight loss.  In fact, she gained few pounds.  She feels tired, does not feel normal in her stomach. She is currently on azathioprine 50 mg daily.  Applied for [employer] as patient did not chose Humira as she does not prefer self injection.  She was previously on Remicade  Crohn's disease classification:  Age: > 40 Location: ileocolonic  Behavior: non stricturing, non penetrating  Perianal: no  IBD diagnosis: 11/2013  Disease course:Crohn's disease in 2015, initial symptoms were abdominal pain, blood in stool and on wiping, bloating. She had a colonoscopy, VCE, EGD at the time of diagnosis. She was initially on mesalamine 4 pills daily, prednisone later switched to Remicade in 11/2014. She took only 5 doses as  she could not afford. Then she was switched to Imuran 50 mg daily. She had mild flareups about twice a year. Colonoscopy 05/2017 revealed mild ileocolonic disease primarily on histology. She self discontinued Imuran. Lialda started in 06/2017.  Possible exacerbation of Crohn's disease with elevated fecal calprotectin in 10/2018.  CT abdomen and pelvis unremarkable.  Discontinued Lialda, started on  azathioprine 50 mg daily  Extra intestinal manifestations: None  IBD surgical history: None No family history of IBD Imaging:  MRE none CTE none SBFT none  Procedures:  Colonoscopy : 01/28/2005, showed external hemorrhoids only Colonoscopy 12/10/2013, showed normal terminal ileum, biopsies performed. Skipped areas of nonbleeding ulcerative mucosa were seen in the entire colon, biopsies performed. 4 mm polyp in the sigmoid colon Pathology: Rectal biopsy mild-to-moderate active proctitis with architectural features of chronicity, negative for dysplasia and malignancy. Terminal ileum: Small bowel mucosa with preserved villous architecture. Right colon biopsy: No significant pathologic changes  Upper Endoscopy 12/10/2013, underwent dilation of the esophagus Ampullary biopsy: Normal, gastric biopsy: Intramucosal with erosion and mild chronic gastritis, negative for H. pylori, dysplasia and malignancy  VCE 05/06/2014: Erosions in the distal ileum  EGD and colonoscopy 03/08/2019  DIAGNOSIS:  A. DUODENUM POLYP; COLD BIOPSY:  - DUODENAL MUCOSA WITH PROMINENT BRUNNER'S GLAND AND REACTIVE CHANGES IN  OVERLYING VILLI.  - [place] ENTERITIS, DYSPLASIA, AND MALIGNANCY.   B. STOMACH, RANDOM; COLD BIOPSY:  - ANTRAL MUCOSA WITH CHANGES CONSISTENT WITH ENDOSCOPIC FINDING OF  ANTRAL EROSION.  - FRAGMENTS OF UNREMARKABLE OXYNTIC MUCOSA.  - NEGATIVE FOR H. PYLORI, DYSPLASIA, AND MALIGNANCY.   C. TERMINAL ILEUM; COLD BIOPSY:  - UNREMARKABLE SMALL INTESTINAL MUCOSA.  - NEGATIVE FOR ACTIVE ENTERITIS, DYSPLASIA, AND MALIGNANCY.   D. COLON, RANDOM RIGHT; COLD BIOPSY:  - MODERATE CHRONIC ACTIVE COLITIS CONSISTENT WITH PATIENT'S KNOWN  HISTORY OF CROHN'S.  - NEGATIVE FOR VIRAL CYTOPATHIC EFFECT, DYSPLASIA, AND MALIGNANCY.   E. COLON, RANDOM TRANSVERSE; COLD BIOPSY:  - MILD CHRONIC ACTIVE COLITIS INVOLVING A MINORITY OF THE BIOPSY  FRAGMENTS.  -  CHRONIC INACTIVE COLITIS INVOLVING THE MAJORITY OF THE BIOPSY  FRAGMENTS.  - NEGATIVE FOR VIRAL CYTOPATHIC EFFECT, DYSPLASIA, AND MALIGNANCY.   F. COLON, RANDOM LEFT; COLD BIOPSY:  - PATCHY MILD CHRONIC INACTIVE COLITIS.  - NEGATIVE FOR DYSPLASIA AND MALIGNANCY.   G. RECTUM, RANDOM PROCTITIS; COLD BIOPSY:  - MARKED CHRONIC ACTIVE PROCTITIS CONSISTENT WITH PATIENT'S KNOWN  HISTORY OF CROHN'S.  - NEGATIVE FOR VIRAL CYTOPATHIC EFFECT, DYSPLASIA, AND MALIGNANCY.  EGD 06/08/2017 Normal esophagus, small hiatal hernia, normal stomach and duodenum  Colonoscopy 06/08/2017 The perianal and digital rectal examinations were normal. Pertinent negatives include normal sphincter tone and no palpable rectal Lesions. The terminal ileum appeared normal. Biopsies were taken with a cold forceps for histology. Two scattered non-bleeding aphthae were found in the ascending colon. Rest of the colon and rectum appeared normal. Random biopsies performed   IBD medications:  Steroids: Prednisone, budesonide 5-ASA: mesalamine, Lialda Immunomodulators: stopped azathioprine  TPMT status unknown Biologics:  Anti TNFs: Remicade monotherapy, received induction followed by 1 maintenance dose in 11/2014 Anti Integrins: Ustekinumab: Tofactinib: Clinical trial:   Past Medical History:  Diagnosis Date   Allergic rhinitis    Anginal pain ([facility])    states MD said it was GERD   Arthritis    Asthma    Breast cancer ([facility]) 2006   LT LUMPECTOMY   CD (Crohn's disease) ([facility]) 05/21/2015   FOLLOWED BY GI    Clotting disorder ([place])  Cognitive deficits as late effect of cerebrovascular disease    COPD (chronic obstructive pulmonary disease) ([facility])    Crohn's disease of both small and large intestine with rectal bleeding ([place]) 12/04/2014   Depression    Currently taking zoloft.   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    Dyspnea    Esophageal reflux    Fever blister 07/19/2018    Glaucoma    vitreous degeneration   History of kidney stones    Hyperlipidemia    Hypertension    Hypothyroidism    Occlusion, cerebral artery    NOS w/infarction   Osteoporosis    Personal history of radiation therapy 2006   BREAST CA   Stroke [facility])    Ulcer     Past Surgical History:  Procedure Laterality Date   BREAST BIOPSY Left 2006   POS   BREAST LUMPECTOMY Left 09/20/2004   positive   BREAST SURGERY Left    malignant biopsy   CARDIAC CATHETERIZATION     COLONOSCOPY  2015   COLONOSCOPY WITH PROPOFOL N/A 06/08/2017   Procedure: COLONOSCOPY WITH PROPOFOL;  Surgeon: Lin Landsman, MD;  Location: [facility];  Service: Gastroenterology;  Laterality: N/A;   COLONOSCOPY WITH PROPOFOL N/A 03/08/2019   Procedure: COLONOSCOPY WITH PROPOFOL;  Surgeon: Lin Landsman, MD;  Location: [facility] ENDOSCOPY;  Service: Gastroenterology;  Laterality: N/A;   ESOPHAGOGASTRODUODENOSCOPY  2015   ESOPHAGOGASTRODUODENOSCOPY (EGD) WITH PROPOFOL N/A 06/08/2017   Procedure: ESOPHAGOGASTRODUODENOSCOPY (EGD) WITH PROPOFOL;  Surgeon: Lin Landsman, MD;  Location: [facility];  Service: Gastroenterology;  Laterality: N/A;   ESOPHAGOGASTRODUODENOSCOPY (EGD) WITH PROPOFOL N/A 03/08/2019   Procedure: ESOPHAGOGASTRODUODENOSCOPY (EGD) WITH PROPOFOL;  Surgeon: Lin Landsman, MD;  Location: [facility] ENDOSCOPY;  Service: Gastroenterology;  Laterality: N/A;   FINGER SURGERY     Right small finger   FRACTURE SURGERY     [facility]IV[facility] CAPSULE STUDY  2015   TUBAL LIGATION       Current Outpatient Medications:    alendronate (FOSAMAX) 70 MG tablet, TAKE 1 TABLET BY MOUTH ONCE A WEEK. TAKE WITH A FULL  GLASS OF WATER ON AN EMPTY  STOMACH., Disp: 12 tablet, Rfl: 3   atorvastatin (LIPITOR) 40 MG tablet, Take 1 tablet (40 mg total) by mouth daily., Disp: 90 tablet, Rfl: 1   azaTHIOprine (IMURAN) 50 MG tablet, Take 1 tablet (50 mg total) by mouth daily., Disp: 90 tablet,  Rfl: 0   diclofenac sodium (VOLTAREN) 1 % GEL, Apply 2 g topically 4 (four) times daily., Disp: 100 g, Rfl: 0   gabapentin (NEURONTIN) 300 MG capsule, Take 1 capsule (300 mg total) by mouth at bedtime., Disp: 90 capsule, Rfl: 0   hydrochlorothiazide (HYDRODIURIL) 12.5 MG tablet, Take 1 tablet (12.5 mg total) by mouth daily., Disp: 90 tablet, Rfl: 1   latanoprost (XALATAN) 0.005 % ophthalmic solution, Place 1 drop into both eyes at bedtime. , Disp: , Rfl:    levothyroxine (SYNTHROID) 50 MCG tablet, Take 1 tablet (50 mcg total) by mouth daily., Disp: 90 tablet, Rfl: 0   Multiple Vitamin (MULTIVITAMIN) tablet, Take 1 tablet by mouth daily., Disp: , Rfl:    omeprazole (PRILOSEC) 40 MG capsule, Take 1 capsule (40 mg total) by mouth daily before breakfast for 30 days., Disp: 30 capsule, Rfl: 1   ondansetron (ZOFRAN) 4 MG tablet, Take 1 tablet (4 mg total) by mouth every 8 (eight) hours as needed for up to 14 doses for nausea or vomiting., Disp: 14 tablet,  Rfl: 0   sertraline (ZOLOFT) 50 MG tablet, Take 0.5 tablets (25 mg total) by mouth daily., Disp: 90 tablet, Rfl: 0   triamcinolone (KENALOG) 0.025 % ointment, Apply 1 application topically 2 (two) times daily., Disp: 30 g, Rfl: 0   valACYclovir (VALTREX) 1000 MG tablet, Take 1 tablet (1,000 mg total) by mouth 2 (two) times daily., Disp: 30 tablet, Rfl: 5  Current Facility-Administered Medications:    ondansetron (ZOFRAN) tablet 4 mg, 4 mg, Oral, Once, Arlissa Monteverde, Tally Due, MD   promethazine (PHENERGAN) tablet 12.5 mg, 12.5 mg, Oral, Once, Linzi Ohlinger, Tally Due, MD   Family History  Problem Relation Age of Onset   Heart disease Mother    Heart attack Mother    Breast cancer Sister    Alcohol abuse Father    Cerebrovascular Accident Father    Arthritis Father    Hypertension Father    Hypertension Other    Diabetes Other    Breast cancer Sister 45   Heart attack Other 104   Cancer Sister      Social History   Tobacco  Use   Smoking status: Never Smoker   Smokeless tobacco: Never Used  Substance Use Topics   Alcohol use: No    Alcohol/week: 0.0 standard drinks   Drug use: No    Allergies as of 03/27/2019   (No Known Allergies)    Review of Systems:    All systems reviewed and negative except where noted in HPI.   Physical Exam:  BP 132/73 (BP Location: Left Arm, Patient Position: Sitting, Cuff Size: Normal)    Pulse 72    Temp 98.2 F (36.8 C)    Resp 17    Ht 5' (1.524 m)    Wt 140 lb (63.5 kg)    BMI 27.34 kg/m  No LMP recorded. Patient is postmenopausal.  General:   Alert,  Well-developed, well-nourished, pleasant and cooperative in NAD Head:  Normocephalic and atraumatic. Eyes:  Sclera clear, no icterus.   Conjunctiva pink. Ears:  Normal auditory acuity. Nose:  No deformity, discharge, or lesions. Mouth:  No deformity or lesions,oropharynx pink & moist. Neck:  Supple; no masses or thyromegaly. Lungs:  Respirations even and unlabored.  Clear throughout to auscultation.   No wheezes, crackles, or rhonchi. No acute distress. Heart:  Regular rate and rhythm; no murmurs, clicks, rubs, or gallops. Abdomen:  Normal bowel sounds.  No bruits.  Soft, mild tenderness in abdomen, and non-distended without masses, hepatosplenomegaly or hernias noted.  No guarding or rebound tenderness.   Rectal: Nor performed Msk:  Symmetrical without gross deformities. Good, equal movement & strength bilaterally. Pulses:  Normal pulses noted. Extremities:  No clubbing or edema.  No cyanosis. Neurologic:  Alert and oriented x3;  grossly normal neurologically. Skin:  Intact without significant lesions or rashes. No jaundice. Psych:  Alert and cooperative. Normal mood and affect.  Imaging Studies: MRI reviewed  Assessment and Plan:   BRIHANA QUICKEL is a 63 y.o. African-American female with Terminal ileal and colonic Crohn's, diagnosed in 2015 who was previously maintained on Imuran 50 mg daily with mild  intermittent flareups about twice a year needing prednisone. She does not have any endoscopy evidence of ileocolonic Crohn's disease other than a few scattered aphthae in the ascending colon on colonoscopy in 05/2017 but histology revealed chronic mild active ileitis and right-sided colitis. MR enterography did not inflammation in small bowel. I tried to give her budesonide but she could not afford high co-pay of $  500 for 30 days supply.  She stopped Imuran by herself in the past.  Recent flareup of her Crohn's disease with worsening of upper abdominal pain associated with nausea, unintentional weight loss, poor p.o. intake. Fecal calprotectin levels are elevated.  Discontinued Lialda, currently on azathioprine 50 mg daily.  She took prednisone for 2 days only due to new onset of rash and itching.  Rash has resolved.  EGD was unremarkable.  Repeat colonoscopy revealed active Crohn's colitis, worse in the rectum with mild symptoms.  Recommend to restart prednisone 40 mg daily while waiting on approval for Entyvio.  I also discussed with her about the nurse ambassador program for administration of Humira at home if her insurance does not approve Entyvio and she is okay with it.    Crohn's disease of small intestine and large intestine -Continue azathioprine 50 mg daily  CBC, LFTs today normal Start prednisone 40 mg daily while waiting on approval for [facility]   IBD Health Maintenance  1.TB status: Gold quantiferon negative 10/2018 2. Anemia: normal Hb, vitamin B12 and ferritin levels are normal 3.Immunizations: Hep A immune, hepatitis B not immune, received 1 dose in 06/2017, she should contact her PCP for hep B vaccine, influenza vaccine received in 06/2018, prevnar received in 06/2017, pneumovax administered today.  Also recommend Shingrix vaccine 4.Cancer screening I) Colon cancer/dysplasia surveillance: Colonoscopy 05/2017, no evidence of dysplasia and no polyps identified.  II) Cervical cancer:  n/a III) Skin cancer - n/a  5.Bone health Vitamin D status: Normal Bone density testing: Not done 5. Labs: today 6. Smoking: Never smoked 7. NSAIDs and Antibiotics use: Denies NSAID use and antibiotic use  Follow up in 2 months   [name], MD

## 2019-03-27 NOTE — Telephone Encounter (Signed)
Pt notified, states she will call back to schedule?

## 2019-03-27 NOTE — Telephone Encounter (Signed)
-----   Message from Hubbard Hartshorn, [place] sent at 03/27/2019  2:54 PM EDT ----- Roselyn Reef - can you make sure Ms. Najarro is set up for the vaccines Dr. Marius Ditch recommended? Thanks! ----- Message ----- From: Lin Landsman, MD Sent: 03/27/2019  12:41 PM EDT To: Hubbard Hartshorn, FNP

## 2019-03-27 NOTE — Telephone Encounter (Addendum)
Please call patient to schedule hepatitis B and Shingrix vaccine series ASAP.  She received Pneumovax at GI office today.  Plan to start her on biologic for Crohn's colitis with GI.

## 2019-04-10 ENCOUNTER — Other Ambulatory Visit: Payer: Self-pay | Admitting: Family Medicine

## 2019-04-10 ENCOUNTER — Other Ambulatory Visit: Payer: Self-pay | Admitting: Gastroenterology

## 2019-04-10 ENCOUNTER — Other Ambulatory Visit: Payer: Self-pay

## 2019-04-10 DIAGNOSIS — F325 Major depressive disorder, single episode, in full remission: Secondary | ICD-10-CM

## 2019-04-10 DIAGNOSIS — R21 Rash and other nonspecific skin eruption: Secondary | ICD-10-CM

## 2019-04-10 DIAGNOSIS — E038 Other specified hypothyroidism: Secondary | ICD-10-CM

## 2019-04-10 MED ORDER — SERTRALINE HCL 50 MG PO TABS: 25.0000 mg | ORAL_TABLET | Freq: Every day | ORAL | 1 refills | Status: DC

## 2019-04-10 MED ORDER — TRIAMCINOLONE ACETONIDE 0.025 % EX OINT: 1.0000 "application " | TOPICAL_OINTMENT | Freq: Two times a day (BID) | CUTANEOUS | 0 refills | Status: DC

## 2019-04-10 MED ORDER — LEVOTHYROXINE SODIUM 50 MCG PO TABS: 50.0000 ug | ORAL_TABLET | Freq: Every day | ORAL | 1 refills | Status: DC

## 2019-04-10 MED ORDER — OMEPRAZOLE 40 MG PO CPDR: 40.0000 mg | DELAYED_RELEASE_CAPSULE | Freq: Every day | ORAL | 0 refills | Status: DC

## 2019-04-10 NOTE — Progress Notes (Signed)
Medication has been refilled and sent to pharmacy per pt request

## 2019-04-10 NOTE — Telephone Encounter (Signed)
Medication has been refilled and sent to pharmacy, pt has been notified  

## 2019-04-10 NOTE — Telephone Encounter (Signed)
Pt notified will come in tomorrow

## 2019-04-10 NOTE — Telephone Encounter (Signed)
Please call her back to schedule - Dr. Marius Ditch wanted this done ASAP so she could start her on a biologic for her Crohn's disease. Thanks!

## 2019-04-11 ENCOUNTER — Other Ambulatory Visit: Payer: Self-pay

## 2019-04-11 ENCOUNTER — Ambulatory Visit (INDEPENDENT_AMBULATORY_CARE_PROVIDER_SITE_OTHER): Payer: Medicare Other

## 2019-04-11 DIAGNOSIS — Z23 Encounter for immunization: Secondary | ICD-10-CM | POA: Diagnosis not present

## 2019-04-18 ENCOUNTER — Encounter: Payer: Self-pay | Admitting: Gastroenterology

## 2019-05-21 ENCOUNTER — Encounter: Payer: Self-pay | Admitting: Gastroenterology

## 2019-05-21 DIAGNOSIS — K5080 Crohn's disease of both small and large intestine without complications: Secondary | ICD-10-CM

## 2019-05-21 MED ORDER — AZATHIOPRINE 50 MG PO TABS: 50.0000 mg | ORAL_TABLET | Freq: Every day | ORAL | 0 refills | Status: DC

## 2019-05-21 NOTE — Telephone Encounter (Signed)
Last office visit 03/27/2019  Last refill 11/06/18 0 refills

## 2019-05-29 ENCOUNTER — Ambulatory Visit (INDEPENDENT_AMBULATORY_CARE_PROVIDER_SITE_OTHER): Payer: Medicare Other | Admitting: Gastroenterology

## 2019-05-29 ENCOUNTER — Other Ambulatory Visit: Payer: Self-pay

## 2019-05-29 VITALS — BP 130/75 | HR 74 | Temp 98.1°F | Ht 60.0 in | Wt 143.2 lb

## 2019-05-29 DIAGNOSIS — K5010 Crohn's disease of large intestine without complications: Secondary | ICD-10-CM | POA: Diagnosis not present

## 2019-05-29 DIAGNOSIS — M7989 Other specified soft tissue disorders: Secondary | ICD-10-CM

## 2019-05-29 DIAGNOSIS — R1084 Generalized abdominal pain: Secondary | ICD-10-CM

## 2019-05-29 NOTE — Progress Notes (Signed)
Carolyn Darby, MD [address]  [place]  [place], [place] [address]  Main: [phone]  Fax: [phone]    Gastroenterology Consultation  Referring Provider:     Hubbard Hartshorn, FNP Primary Care Physician:  Hubbard Hartshorn, FNP Primary Gastroenterologist:  Dr. Cephas Campbell Reason for Consultation: Crohn's Disease        HPI:   Carolyn Campbell is a 63 y.o. African-American female referred by Dr. Uvaldo Rising, Astrid Divine, FNP  for consultation & management of Crohn's disease. This was diagnosed in 2015, currently on Imuran 50 mg daily. She reports intermittent flareups of her Crohn's disease such as abdominal pain, bloating, rectal bleeding. She received prednisone for flare up. She had 2 flareups within last 1 year. She recently is suffering with constipation, had to take laxatives to have a BM. Her last BM was 3 days ago. She does report some upper abdominal bloating. She denies any weight loss, rectal bleeding, diarrhea, nausea, vomiting. She is not on any prednisone currently. She denies taking NSAIDs.  Follow up visit 10/24/18 She thinks she has flare up of crohn's as she has mild abdominal discomfort that limited her PO intake. She continues to have atypical chest discomfort for which she is taking tums. She mentioned about chest pain during last visit, I urged her to see her PCP which she did not yet  Follow-up visit 11/06/2018 Since last visit, her symptoms worsened.  Therefore, patient made an urgent visit to see me today.  She reports upper abdominal pain limiting her p.o. intake, results in nausea, describes it as burning.  She is taking Tums and Prilosec as needed which provides some relief. She denies diarrhea.  She lost about 6 pounds in last 3 weeks.  She denies fever, chills.  She is accompanied by her husband today.  I was planning to start her on immunomodulator, she underwent screening for TB and hepatitis B, which were negative.  TPMT levels were normal.  Follow-up visit  11/20/2018 She reports feeling significantly better, reports mild pain in the epigastric region.  She started taking azathioprine 50 mg daily.  She tried prednisone 40 mg for 2 days then developed itching associated with rash in her hands, she felt itching might be secondary to prednisone so stopped taking it.  Rash currently resolved.  Eating well.  CT abdomen and pelvis revealed possible cystitis, UA showed leukocytes and she was given ceftriaxone at her PCPs office and given Bactrim twice daily for 5 days.  Urine cultures are negative  Follow-up visit 03/27/2019 Patient underwent EGD and colonoscopy.  EGD was unremarkable, colonoscopy revealed mild to moderate active colitis.  Patient continues to have mild symptoms of abdominal discomfort, blood mixed with stool and on wiping.  She denies diarrhea, weight loss.  In fact, she gained few pounds.  She feels tired, does not feel normal in her stomach. She is currently on azathioprine 50 mg daily.  Applied for [employer] as patient did not chose Humira as she does not prefer self injection.  She was previously on Remicade  Follow-up visit 05/29/2019 Patient received first dose of Entyvio on 05/23/2019 in [place].  She tolerated it well without any side effects.  She noticed improvement in her stool consistency as well as minimal rectal bleeding.  She has gained few pounds as well.  Today, she is concerned about 1 week history of left leg pain associated with some swelling and tenderness.  She denies fever, chills.  She continues to  take azathioprine 50 mg daily.  She finished prednisone course  Crohn's disease classification:  Age: > 40 Location: ileocolonic  Behavior: non stricturing, non penetrating  Perianal: no  IBD diagnosis: 11/2013  Disease course:Crohn's disease in 2015, initial symptoms were abdominal pain, blood in stool and on wiping, bloating. She had a colonoscopy, VCE, EGD at the time of diagnosis. She was initially on mesalamine 4 pills  daily, prednisone later switched to Remicade in 11/2014. She took only 5 doses as she could not afford. Then she was switched to Imuran 50 mg daily. She had mild flareups about twice a year. Colonoscopy 05/2017 revealed mild ileocolonic disease primarily on histology. She self discontinued Imuran. Lialda started in 06/2017.  Possible exacerbation of Crohn's disease with elevated fecal calprotectin in 10/2018.  CT abdomen and pelvis unremarkable.  Discontinued Lialda, started on azathioprine 50 mg daily.  Flareup of Crohn's in 10/2018, confirmed on colonoscopy and elevated fecal calprotectin levels.  Short prednisone course and started Entyvio on 05/23/2019  Extra intestinal manifestations: None  IBD surgical history: None No family history of IBD Imaging:  MRE none CTE none SBFT none  Procedures:  Colonoscopy : 01/28/2005, showed external hemorrhoids only Colonoscopy 12/10/2013, showed normal terminal ileum, biopsies performed. Skipped areas of nonbleeding ulcerative mucosa were seen in the entire colon, biopsies performed. 4 mm polyp in the sigmoid colon Pathology: Rectal biopsy mild-to-moderate active proctitis with architectural features of chronicity, negative for dysplasia and malignancy. Terminal ileum: Small bowel mucosa with preserved villous architecture. Right colon biopsy: No significant pathologic changes  Upper Endoscopy 12/10/2013, underwent dilation of the esophagus Ampullary biopsy: Normal, gastric biopsy: Intramucosal with erosion and mild chronic gastritis, negative for H. pylori, dysplasia and malignancy  VCE 05/06/2014: Erosions in the distal ileum  EGD and colonoscopy 03/08/2019  DIAGNOSIS:  A. DUODENUM POLYP; COLD BIOPSY:  - DUODENAL MUCOSA WITH PROMINENT BRUNNER'S GLAND AND REACTIVE CHANGES IN  OVERLYING VILLI.  - [place] ENTERITIS, DYSPLASIA, AND MALIGNANCY.   B. STOMACH, RANDOM; COLD BIOPSY:  - ANTRAL MUCOSA WITH CHANGES  CONSISTENT WITH ENDOSCOPIC FINDING OF  ANTRAL EROSION.  - FRAGMENTS OF UNREMARKABLE OXYNTIC MUCOSA.  - NEGATIVE FOR H. PYLORI, DYSPLASIA, AND MALIGNANCY.   C. TERMINAL ILEUM; COLD BIOPSY:  - UNREMARKABLE SMALL INTESTINAL MUCOSA.  - NEGATIVE FOR ACTIVE ENTERITIS, DYSPLASIA, AND MALIGNANCY.   D. COLON, RANDOM RIGHT; COLD BIOPSY:  - MODERATE CHRONIC ACTIVE COLITIS CONSISTENT WITH PATIENT'S KNOWN  HISTORY OF CROHN'S.  - NEGATIVE FOR VIRAL CYTOPATHIC EFFECT, DYSPLASIA, AND MALIGNANCY.   E. COLON, RANDOM TRANSVERSE; COLD BIOPSY:  - MILD CHRONIC ACTIVE COLITIS INVOLVING A MINORITY OF THE BIOPSY  FRAGMENTS.  - CHRONIC INACTIVE COLITIS INVOLVING THE MAJORITY OF THE BIOPSY  FRAGMENTS.  - NEGATIVE FOR VIRAL CYTOPATHIC EFFECT, DYSPLASIA, AND MALIGNANCY.   F. COLON, RANDOM LEFT; COLD BIOPSY:  - PATCHY MILD CHRONIC INACTIVE COLITIS.  - NEGATIVE FOR DYSPLASIA AND MALIGNANCY.   G. RECTUM, RANDOM PROCTITIS; COLD BIOPSY:  - MARKED CHRONIC ACTIVE PROCTITIS CONSISTENT WITH PATIENT'S KNOWN  HISTORY OF CROHN'S.  - NEGATIVE FOR VIRAL CYTOPATHIC EFFECT, DYSPLASIA, AND MALIGNANCY.  EGD 06/08/2017 Normal esophagus, small hiatal hernia, normal stomach and duodenum  Colonoscopy 06/08/2017 The perianal and digital rectal examinations were normal. Pertinent negatives include normal sphincter tone and no palpable rectal Lesions. The terminal ileum appeared normal. Biopsies were taken with a cold forceps for histology. Two scattered non-bleeding aphthae were found in the ascending colon. Rest of the colon and rectum appeared  normal. Random biopsies performed   IBD medications:  Steroids: Prednisone steroid responsive, budesonide 5-ASA: mesalamine, Lialda in the past Immunomodulators: azathioprine  TPMT status normal Biologics:  Anti TNFs: Remicade monotherapy, received induction followed by 1 maintenance dose in 11/2014 Anti Integrins: Entyvio started on 05/23/2019 Ustekinumab: Tofactinib: Clinical  trial:   Past Medical History:  Diagnosis Date   Abdominal pain, epigastric    Allergic rhinitis    Anginal pain ([place])    states MD said it was GERD   Arthritis    Asthma    Breast cancer ([place]) 2006   LT LUMPECTOMY   CD (Crohn's disease) ([place]) 05/21/2015   FOLLOWED BY GI    Clotting disorder ([facility])    Cognitive deficits as late effect of cerebrovascular disease    COPD (chronic obstructive pulmonary disease) ([facility])    Crohn's disease of both small and large intestine with rectal bleeding ([place]) 12/04/2014   Depression    Currently taking zoloft.   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    Dyspnea    Esophageal reflux    Fever blister 07/19/2018   Glaucoma    vitreous degeneration   History of kidney stones    Hyperlipidemia    Hypertension    Hypothyroidism    Occlusion, cerebral artery    NOS w/infarction   Osteoporosis    Personal history of radiation therapy 2006   BREAST CA   Stroke [facility])    Ulcer     Past Surgical History:  Procedure Laterality Date   BREAST BIOPSY Left 2006   POS   BREAST LUMPECTOMY Left 09/20/2004   positive   BREAST SURGERY Left    malignant biopsy   CARDIAC CATHETERIZATION     COLONOSCOPY  2015   COLONOSCOPY WITH PROPOFOL N/A 06/08/2017   Procedure: COLONOSCOPY WITH PROPOFOL;  Surgeon: Lin Landsman, MD;  Location: [facility];  Service: Gastroenterology;  Laterality: N/A;   COLONOSCOPY WITH PROPOFOL N/A 03/08/2019   Procedure: COLONOSCOPY WITH PROPOFOL;  Surgeon: Lin Landsman, MD;  Location: [facility] ENDOSCOPY;  Service: Gastroenterology;  Laterality: N/A;   ESOPHAGOGASTRODUODENOSCOPY  2015   ESOPHAGOGASTRODUODENOSCOPY (EGD) WITH PROPOFOL N/A 06/08/2017   Procedure: ESOPHAGOGASTRODUODENOSCOPY (EGD) WITH PROPOFOL;  Surgeon: Lin Landsman, MD;  Location: [facility];  Service: Gastroenterology;  Laterality: N/A;   ESOPHAGOGASTRODUODENOSCOPY (EGD) WITH PROPOFOL N/A  03/08/2019   Procedure: ESOPHAGOGASTRODUODENOSCOPY (EGD) WITH PROPOFOL;  Surgeon: Lin Landsman, MD;  Location: [facility] ENDOSCOPY;  Service: Gastroenterology;  Laterality: N/A;   FINGER SURGERY     Right small finger   FRACTURE SURGERY     [facility]IV[place] CAPSULE STUDY  2015   TUBAL LIGATION       Current Outpatient Medications:    alendronate (FOSAMAX) 70 MG tablet, TAKE 1 TABLET BY MOUTH ONCE A WEEK. TAKE WITH A FULL  GLASS OF WATER ON AN EMPTY  STOMACH., Disp: 12 tablet, Rfl: 3   atorvastatin (LIPITOR) 40 MG tablet, Take 1 tablet (40 mg total) by mouth daily., Disp: 90 tablet, Rfl: 1   azaTHIOprine (IMURAN) 50 MG tablet, Take 1 tablet (50 mg total) by mouth daily., Disp: 90 tablet, Rfl: 0   diclofenac sodium (VOLTAREN) 1 % GEL, Apply 2 g topically 4 (four) times daily., Disp: 100 g, Rfl: 0   gabapentin (NEURONTIN) 300 MG capsule, Take 1 capsule (300 mg total) by mouth at bedtime., Disp: 90 capsule, Rfl: 0   hydrochlorothiazide (HYDRODIURIL) 12.5 MG tablet, Take 1 tablet (12.5 mg total) by mouth  daily., Disp: 90 tablet, Rfl: 1   latanoprost (XALATAN) 0.005 % ophthalmic solution, Place 1 drop into both eyes at bedtime. , Disp: , Rfl:    levothyroxine (SYNTHROID) 50 MCG tablet, Take 1 tablet (50 mcg total) by mouth daily., Disp: 90 tablet, Rfl: 1   Multiple Vitamin (MULTIVITAMIN) tablet, Take 1 tablet by mouth daily., Disp: , Rfl:    omeprazole (PRILOSEC) 40 MG capsule, Take 1 capsule (40 mg total) by mouth daily before breakfast., Disp: 90 capsule, Rfl: 0   ondansetron (ZOFRAN) 4 MG tablet, Take 1 tablet (4 mg total) by mouth every 8 (eight) hours as needed for up to 14 doses for nausea or vomiting., Disp: 14 tablet, Rfl: 0   sertraline (ZOLOFT) 50 MG tablet, Take 0.5 tablets (25 mg total) by mouth daily., Disp: 90 tablet, Rfl: 1   triamcinolone (KENALOG) 0.025 % ointment, Apply 1 application topically 2 (two) times daily., Disp: 30 g, Rfl: 0   valACYclovir (VALTREX) 1000 MG  tablet, Take 1 tablet (1,000 mg total) by mouth 2 (two) times daily., Disp: 30 tablet, Rfl: 5   vedolizumab (ENTYVIO) 300 MG injection, Inject into the vein., Disp: , Rfl:   Current Facility-Administered Medications:    ondansetron (ZOFRAN) tablet 4 mg, 4 mg, Oral, Once, Carolyn Campbell, Carolyn Due, MD   promethazine (PHENERGAN) tablet 12.5 mg, 12.5 mg, Oral, Once, Carolyn Campbell, Carolyn Due, MD   Family History  Problem Relation Age of Onset   Heart disease Mother    Heart attack Mother    Breast cancer Sister    Alcohol abuse Father    Cerebrovascular Accident Father    Arthritis Father    Hypertension Father    Hypertension Other    Diabetes Other    Breast cancer Sister 37   Heart attack Other 59   Cancer Sister      Social History   Tobacco Use   Smoking status: Never Smoker   Smokeless tobacco: Never Used  Substance Use Topics   Alcohol use: No    Alcohol/week: 0.0 standard drinks   Drug use: No    Allergies as of 05/29/2019   (No Known Allergies)    Review of Systems:    All systems reviewed and negative except where noted in HPI.   Physical Exam:  BP 130/75    Pulse 74    Temp 98.1 F (36.7 C) (Oral)    Ht 5' (1.524 m)    Wt 143 lb 3.2 oz (65 kg)    BMI 27.97 kg/m  No LMP recorded. Patient is postmenopausal.  General:   Alert,  Well-developed, well-nourished, pleasant and cooperative in NAD Head:  Normocephalic and atraumatic. Eyes:  Sclera clear, no icterus.   Conjunctiva pink. Ears:  Normal auditory acuity. Nose:  No deformity, discharge, or lesions. Mouth:  No deformity or lesions,oropharynx pink & moist. Neck:  Supple; no masses or thyromegaly. Lungs:  Respirations even and unlabored.  Clear throughout to auscultation.   No wheezes, crackles, or rhonchi. No acute distress. Heart:  Regular rate and rhythm; no murmurs, clicks, rubs, or gallops. Abdomen:  Normal bowel sounds.  No bruits.  Soft, mild tenderness in abdomen, and non-distended  without masses, hepatosplenomegaly or hernias noted.  No guarding or rebound tenderness.   Rectal: Nor performed Msk:  Symmetrical without gross deformities. Good, equal movement & strength bilaterally. Pulses:  Normal pulses noted. Extremities:  No clubbing or edema.  No cyanosis. Neurologic:  Alert and oriented x3;  grossly normal neurologically. Skin:  Intact without significant lesions or rashes. No jaundice. Psych:  Alert and cooperative. Normal mood and affect.  Imaging Studies: MRI reviewed  Assessment and Plan:   Carolyn Campbell is a 63 y.o. African-American female with Terminal ileal and colonic Crohn's, diagnosed in 2015 who was previously maintained on Imuran 50 mg daily with mild intermittent flareups about twice a year needing prednisone. She does not have any endoscopy evidence of ileocolonic Crohn's disease other than a few scattered aphthae in the ascending colon on colonoscopy in 05/2017 but histology revealed chronic mild active ileitis and right-sided colitis. MR enterography did not inflammation in small bowel. I tried to give her budesonide but she could not afford high co-pay of $500 for 30 days supply.  She stopped Imuran by herself in the past.  Recent flareup of her Crohn's disease with worsening of upper abdominal pain associated with nausea, unintentional weight loss, poor p.o. intake. Fecal calprotectin levels are elevated.  Discontinued Lialda, currently on azathioprine 50 mg daily.  She took prednisone for 2 days only Campbell to new onset of rash and itching.  Rash has resolved.  EGD was unremarkable.  Repeat colonoscopy revealed active Crohn's colitis, worse in the rectum with mild symptoms.  Started on Entyvio on 05/23/2019, currently in clinical remission.  Crohn's disease of small intestine and large intestine -Continue azathioprine 50 mg daily for next 3 months, then stop -CBC, LFTs today -Continue Entyvio  Left lower extremity pain and swelling Recommend ultrasound  Dopplers to rule out DVT given her history of IBD   IBD Health Maintenance  1.TB status: Gold quantiferon negative 10/2018 2. Anemia: normal Hb, vitamin B12 and ferritin levels are normal 3.Immunizations: Hep A immune, hepatitis B not immune, received 1 dose in 06/2017, hep B vaccine received on 04/11/2019, influenza vaccine received in 06/2018, prevnar received in 06/2017, pneumovax administered on 03/27/2019.  Received first dose of Shingrix vaccine on 04/11/2019 4.Cancer screening I) Colon cancer/dysplasia surveillance: Colonoscopy 03/08/2019, no evidence of dysplasia and no polyps identified.  II) Cervical cancer: n/a III) Skin cancer - n/a  5.Bone health Vitamin D status: Normal Bone density testing: Not done 5. Labs: today 6. Smoking: Never smoked 7. NSAIDs and Antibiotics use: Denies NSAID use and antibiotic use  Follow up in 3 months   Carolyn Darby, MD

## 2019-05-30 ENCOUNTER — Ambulatory Visit
Admission: RE | Admit: 2019-05-30 | Discharge: 2019-05-30 | Disposition: A | Discharge status: Home / Self Care | Payer: Medicare Other | Source: Ambulatory Visit | Attending: Gastroenterology | Admitting: Gastroenterology

## 2019-05-30 DIAGNOSIS — M7989 Other specified soft tissue disorders: Secondary | ICD-10-CM | POA: Diagnosis present

## 2019-05-30 LAB — COMPREHENSIVE METABOLIC PANEL
ALT: 24 IU/L (ref 0–32)
AST: 23 IU/L (ref 0–40)
Albumin/Globulin Ratio: 1.8 (ref 1.2–2.2)
Albumin: 4.3 g/dL (ref 3.8–4.8)
Alkaline Phosphatase: 91 IU/L (ref 39–117)
BUN/Creatinine Ratio: 19 (ref 12–28)
BUN: 16 mg/dL (ref 8–27)
Bilirubin Total: 0.5 mg/dL (ref 0.0–1.2)
CO2: 27 mmol/L (ref 20–29)
Calcium: 9.8 mg/dL (ref 8.7–10.3)
Chloride: 103 mmol/L (ref 96–106)
Creatinine, Ser: 0.83 mg/dL (ref 0.57–1.00)
GFR calc Af Amer: 86 mL/min/{1.73_m2} (ref >59)
GFR calc non Af Amer: 74 mL/min/{1.73_m2} (ref >59)
Globulin, Total: 2.4 g/dL (ref 1.5–4.5)
Glucose: 97 mg/dL (ref 65–99)
Potassium: 4 mmol/L (ref 3.5–5.2)
Sodium: 145 mmol/L — ABNORMAL HIGH (ref 134–144)
Total Protein: 6.7 g/dL (ref 6.0–8.5)

## 2019-05-30 LAB — CBC
Hematocrit: 35.2 % (ref 34.0–46.6)
Hemoglobin: 11.6 g/dL (ref 11.1–15.9)
MCH: 29.5 pg (ref 26.6–33.0)
MCHC: 33 g/dL (ref 31.5–35.7)
MCV: 90 fL (ref 79–97)
Platelets: 212 10*3/uL (ref 150–450)
RBC: 3.93 x10E6/uL (ref 3.77–5.28)
RDW: 13.2 % (ref 11.7–15.4)
WBC: 4.4 10*3/uL (ref 3.4–10.8)

## 2019-06-07 ENCOUNTER — Encounter: Payer: Self-pay | Admitting: Family Medicine

## 2019-06-09 ENCOUNTER — Encounter: Payer: Self-pay | Admitting: Family Medicine

## 2019-06-09 DIAGNOSIS — I10 Essential (primary) hypertension: Secondary | ICD-10-CM

## 2019-06-09 MED ORDER — HYDROCHLOROTHIAZIDE 12.5 MG PO TABS: 12.5000 mg | ORAL_TABLET | Freq: Every day | ORAL | 1 refills | Status: DC

## 2019-06-13 ENCOUNTER — Telehealth (INDEPENDENT_AMBULATORY_CARE_PROVIDER_SITE_OTHER): Payer: Self-pay | Admitting: Vascular Surgery

## 2019-06-14 NOTE — Telephone Encounter (Signed)
Bring her in with abis and left lower extremity venous reflux

## 2019-06-18 ENCOUNTER — Telehealth (INDEPENDENT_AMBULATORY_CARE_PROVIDER_SITE_OTHER): Payer: Self-pay | Admitting: Vascular Surgery

## 2019-06-18 NOTE — Telephone Encounter (Signed)
Proceed per discussion we had with [place]

## 2019-06-18 NOTE — Telephone Encounter (Signed)
Patient called back on 9/23 complaining of leg pain and requesting an apt. You advised to schedule with [facility].... Please advise. AS, CMA

## 2019-06-18 NOTE — Telephone Encounter (Signed)
Patient has been advised by Markham Jordan that once she receives verification from her insurance company that the laser procedure was approved she would be contacted to schedule the laser procedure.   Patient would like to cancel upcoming apt for [place] and to see Arna Medici. AS, CMA

## 2019-06-20 ENCOUNTER — Encounter (INDEPENDENT_AMBULATORY_CARE_PROVIDER_SITE_OTHER): Payer: Medicare Other

## 2019-06-20 ENCOUNTER — Ambulatory Visit (INDEPENDENT_AMBULATORY_CARE_PROVIDER_SITE_OTHER): Payer: Medicare Other | Admitting: Nurse Practitioner

## 2019-06-21 ENCOUNTER — Encounter: Payer: Self-pay | Admitting: Family Medicine

## 2019-06-21 ENCOUNTER — Ambulatory Visit (INDEPENDENT_AMBULATORY_CARE_PROVIDER_SITE_OTHER): Payer: Medicare Other | Admitting: Family Medicine

## 2019-06-21 ENCOUNTER — Other Ambulatory Visit: Payer: Self-pay

## 2019-06-21 VITALS — Ht 60.0 in | Wt 140.0 lb

## 2019-06-21 DIAGNOSIS — E559 Vitamin D deficiency, unspecified: Secondary | ICD-10-CM

## 2019-06-21 DIAGNOSIS — I10 Essential (primary) hypertension: Secondary | ICD-10-CM | POA: Diagnosis not present

## 2019-06-21 DIAGNOSIS — G8929 Other chronic pain: Secondary | ICD-10-CM

## 2019-06-21 DIAGNOSIS — M19041 Primary osteoarthritis, right hand: Secondary | ICD-10-CM

## 2019-06-21 DIAGNOSIS — Z853 Personal history of malignant neoplasm of breast: Secondary | ICD-10-CM

## 2019-06-21 DIAGNOSIS — E039 Hypothyroidism, unspecified: Secondary | ICD-10-CM

## 2019-06-21 DIAGNOSIS — I69359 Hemiplegia and hemiparesis following cerebral infarction affecting unspecified side: Secondary | ICD-10-CM

## 2019-06-21 DIAGNOSIS — R600 Localized edema: Secondary | ICD-10-CM

## 2019-06-21 DIAGNOSIS — E7800 Pure hypercholesterolemia, unspecified: Secondary | ICD-10-CM

## 2019-06-21 DIAGNOSIS — I83813 Varicose veins of bilateral lower extremities with pain: Secondary | ICD-10-CM

## 2019-06-21 DIAGNOSIS — K5000 Crohn's disease of small intestine without complications: Secondary | ICD-10-CM

## 2019-06-21 DIAGNOSIS — I700 Atherosclerosis of aorta: Secondary | ICD-10-CM

## 2019-06-21 DIAGNOSIS — M8580 Other specified disorders of bone density and structure, unspecified site: Secondary | ICD-10-CM

## 2019-06-21 DIAGNOSIS — D849 Immunodeficiency, unspecified: Secondary | ICD-10-CM

## 2019-06-21 DIAGNOSIS — M542 Cervicalgia: Secondary | ICD-10-CM

## 2019-06-21 NOTE — Progress Notes (Signed)
Name: Carolyn Campbell   MRN: [id number]    DOB: December 04, 1955   Date:06/21/2019       Progress Note  Subjective  Chief Complaint  Chief Complaint  Patient presents with  . Medication Refill  . Hypertension    Swelling in her legs  . Gastroesophageal Reflux    Has been better with medication  . Hypothyroidism  . Chronic back pain and neck with radiculitis    Unchanged  . Depression    I connected with  DEMETRIS MEINHARDT  on 06/21/19 at 10:00 AM EDT by a video enabled telemedicine application and verified that I am speaking with the correct person using two identifiers.  I discussed the limitations of evaluation and management by telemedicine and the availability of in person appointments. The patient expressed understanding and agreed to proceed. Staff also discussed with the patient that there may be a patient responsible charge related to this service. Patient Location: Home Provider Location: Office Additional Individuals present: None  HPI   HTN/BLE edema:BP has been well controlled,denies lightheadedness/orthostatic lightheadedness.  Stopped benazepril due to low BP's.  Still taking HCTZ 12.52m daily more for BLE edema that occurs sometimes.  She was seen by Dr. VMarius Ditchabout 3 weeks ago and had concern for BLE edema - had [place] which was negative for clot.  She denies chest pain, shortness of breath, headaches, vision changes (does note that she has glaucoma).  Home BP today was 137/69.  BP Readings from Last 3 Encounters:  05/29/19 130/75  03/27/19 132/73  03/08/19 (!) 127/57   Varicose Veins/BLE Edema:Saw vascular and was told to wear compression stockings int he past, but has not been doing so.  She also notes varicose veins in bilateral upper thighs that are starting to become more prominent and painful. Does have some bruising on the legs when she hits them on something. She is scheduled with Dr. DLucky Cowboyon 07/13/2019 for further evaluation.  Atherosclerosis of  aorta/HLD:Taking lipitor every night, no SE's.Not taking aspirin due to Crohn's. Denies chest pain, shortness of breath, or myalgias.  S/p CVA with left side weakness:States left side weakness is same and not worse.She endorses ongoing intermittent difficulty saying/choosing the correct words, but this has been stable. She always has some left foot drop, has had some close calls,but no falls lately.On statin & BP meds; lipids and BP well controlled.  Discussed referral to PT to consider brace for her foot and possible gait analysis - she is ok with referral today.  Immunosuppression secondary to Chron's disease and medication use:Follows up with Dr. VMarius Ditch had recent flare, thinks she is doing better now on current regimen. Patient no longer on Lialda on Imuran, using Entyvio and this seems to be working well for her.Patientdenies pain with BM, abdominal pain, or BRBPR.  GERD:Back on omeprazole;sees GI, avoids dietary triggers. She did have a few aphthous ulcers a few weeks ago and thinks this was from some acid reflux, but this has since resolved.  Hypothyroidism:Synthroid53mhas been on stable dose for over 2 years, denies unintentional  weightchanges (notes has been more sedentary and eating more lately due to COVID-19 quarantine), bowel changes, skin/hair/nailchanges. No history of thyroid nodules or enlarged thyroid. Last check was WNL, due for labs today for this.  Hx LEFT breast cancer w/ lumpectomy:Has mammogram done11/19/2019 and was negative. Is doing mammograms once yearly, did do radiation in 2006. She is feeling well now. Due for mammogram in November, order placed today.  Chronic  back pain and neck with radiculitis: Has not been having back pain in quite some time - well controlled with gabapentin. Takes gabapentin every night.Sometimes has trouble getting comfortable to sleep at night.  Doing well on medication, will maintain.   Arthritis of right  hand:states worse when she uses it a lot; patient states typically just deals with the pain sometimes does hand stretching for relief.Has voltaren gel, does have some stiffness and weaker grip at times.No changes today; will have PT referral today for further evaluation.  Fever blisters:States takes valtrex as needed, for fever blisters around mouth, no recent outbreaks, though did have recent aphthous ulcers last week that have since resolved.   Osteopenia/vitamin D deficiency: taking Fossamax weekly. Last bone density was Sept 2018 - duefor repeat DEXA scan.  No changes, no fractures. Has vitamin D deficiency, encouraged her to take supplement.   Depression/Anxiety: Takes zoloft every day- states it is well controlled on this dosehas probably been on it for 15 years, she would like to decrease and eventually come off of the medication if she can. States has had depression due to husbands health. Denies SI/HI.She has been taking 1/2 tablet (86m) for the last several months, and she does not want to go up to 544mat this time despite my recommendations.    [facility]  8       Patient Active Problem List   Diagnosis Date Noted  . Varicose veins of both lower extremities with pain 07/19/2018  . Arthritis of right hand 12/21/2017  . Immunosuppressed status ([facility]04/11/2017  . Major depression in remission ([facility]04/11/2017  . Fibrocystic breast changes 06/22/2017  . Osteopenia 10/18/2016  . Atherosclerosis of aorta ([facility]03/31/2017  . Vitreous degeneration 05/21/2015  . Bad memory 05/21/2015  . Glaucoma associated with chamber angle anomalies 05/21/2015  . Dermatitis, eczematoid 05/21/2015  . CD (Crohn's disease) ([facility]08/31/2016  . H/O malignant neoplasm of breast 05/21/2015  . Crohn's disease of both small and large intestine with rectal bleeding ([facility]03/16/2016  . Solitary pulmonary nodule 11/07/2012  .  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 Cognitive deficits as late effect of cerebrovascular disease 01/20/2010  . CVA, old, hemiparesis (HCS[facility]08/05/2009  . Adult hypothyroidism 06/12/2008  . Acid reflux 05/23/2007  . Hypercholesterolemia without hypertriglyceridemia 01/18/2007  . Benign essential HTN 01/18/2007    Past Surgical History:  Procedure Laterality Date  . BREAST BIOPSY Left 2006   POS  . BREAST LUMPECTOMY Left 09/20/2004   positive  . BREAST SURGERY Left    malignant biopsy  . CARDIAC CATHETERIZATION    . COLONOSCOPY  2015  . COLONOSCOPY WITH PROPOFOL N/A 06/08/2017   Procedure: COLONOSCOPY WITH PROPOFOL;  Surgeon: VaLin LandsmanMD;  [facility] Service: Gastro[facility]ology;  Laterality: N/A;  . COLONOSCOPY WITH PROPOFOL N/A 03/08/2019   Procedure: COLONOSCOPY WITH PROPOFOL;  Surgeon: VaLin LandsmanMD;  [facility];  Service: Gastroenterology;  Laterality: N/A;  . ESOPHAGOGASTRODUODENOSCOPY  2015  . ESOPHAGOGASTRODUODENOSCOPY (EGD) WITH PROPOFOL N/A 06/08/2017   Procedure: ESOPHAGOGASTRODUODENOSCOPY (EGD) WITH PROPOFOL;  Surgeon: VaLin LandsmanMD;  [place] [facility] Service: Gastroenterology;  Laterality: N/A;  . ESOPHAGOGASTRODUODENOSCOPY (EGD) WITH PROPOFOL N/A 03/08/2019   Procedure: ESOPHAGOGASTRODUODENOSCOPY (EGD) WITH PROPOFOL;  Surgeon: VaLin LandsmanMD;  [facility];  Service: Gastroenterology;  Laterality: N/A;  . FINGER SURGERY  Right small finger  . FRACTURE SURGERY    . [place] STUDY  2015  . TUBAL LIGATION      Family History  Problem Relation Age of Onset  . Heart disease Mother   . Heart attack Mother   . Breast cancer Sister   . Alcohol abuse Father   . Cerebrovascular Accident Father   . Arthritis Father   . Hypertension Father   . Hypertension Other   . Diabetes Other   . Breast cancer Sister 69  . Heart attack Other 41  .  Cancer Sister     Social History   Socioeconomic History  . Marital status: Married    Spouse name: Not on file  . Number of children: 2  . Years of education: Not on file  . Highest education level: Some college, no degree  Occupational History  . Not on file  Social Needs  . Financial resource strain: Not hard at all  . Food insecurity    Worry: Never true    Inability: Never true  . Transportation needs    Medical: No    Non-medical: No  Tobacco Use  . Smoking status: Never Smoker  . Smokeless tobacco: Never Used  Substance and Sexual Activity  . Alcohol use: No    Alcohol/week: 0.0 standard drinks  . Drug use: No  . Sexual activity: Yes    Partners: Male    Birth control/protection: None  Lifestyle  . Physical activity    Days per week: 2 days    Minutes per session: 40 min  . Stress: Only a little  Relationships  . Social connections    Talks on phone: More than three times a week    Gets together: Three times a week    Attends religious service: More than 4 times per year    Active member of club or organization: No    Attends meetings of clubs or organizations: Never    Relationship status: Married  . Intimate partner violence    Fear of current or ex partner: No    Emotionally abused: No    Physically abused: No    Forced sexual activity: No  Other Topics Concern  . Not on file  Social History Narrative   Married. One son and one daughter. She is disabled after she had breast cancer and strokes. 2 caffeinated beverages daily.     Current Outpatient Medications:  .  alendronate (FOSAMAX) 70 MG tablet, TAKE 1 TABLET BY MOUTH ONCE A WEEK. TAKE WITH A FULL  GLASS OF WATER ON AN EMPTY  STOMACH., Disp: 12 tablet, Rfl: 3 .  atorvastatin (LIPITOR) 40 MG tablet, Take 1 tablet (40 mg total) by mouth daily., Disp: 90 tablet, Rfl: 1 .  diclofenac sodium (VOLTAREN) 1 % GEL, Apply 2 g topically 4 (four) times daily., Disp: 100 g, Rfl: 0 .  gabapentin (NEURONTIN)  300 MG capsule, Take 1 capsule (300 mg total) by mouth at bedtime., Disp: 90 capsule, Rfl: 0 .  hydrochlorothiazide (HYDRODIURIL) 12.5 MG tablet, Take 1 tablet (12.5 mg total) by mouth daily., Disp: 90 tablet, Rfl: 1 .  latanoprost (XALATAN) 0.005 % ophthalmic solution, Place 1 drop into both eyes at bedtime. , Disp: , Rfl:  .  levothyroxine (SYNTHROID) 50 MCG tablet, Take 1 tablet (50 mcg total) by mouth daily., Disp: 90 tablet, Rfl: 1 .  Multiple Vitamin (MULTIVITAMIN) tablet, Take 1 tablet by mouth daily., Disp: , Rfl:  .  omeprazole (PRILOSEC)  40 MG capsule, Take 1 capsule (40 mg total) by mouth daily before breakfast., Disp: 90 capsule, Rfl: 0 .  sertraline (ZOLOFT) 50 MG tablet, Take 0.5 tablets (25 mg total) by mouth daily., Disp: 90 tablet, Rfl: 1 .  triamcinolone (KENALOG) 0.025 % ointment, Apply 1 application topically 2 (two) times daily., Disp: 30 g, Rfl: 0 .  valACYclovir (VALTREX) 1000 MG tablet, Take 1 tablet (1,000 mg total) by mouth 2 (two) times daily., Disp: 30 tablet, Rfl: 5 .  vedolizumab (ENTYVIO) 300 MG injection, Inject into the vein., Disp: , Rfl:  .  azaTHIOprine (IMURAN) 50 MG tablet, Take 1 tablet (50 mg total) by mouth daily. (Patient not taking: Reported on 06/21/2019), Disp: 90 tablet, Rfl: 0 .  ondansetron (ZOFRAN) 4 MG tablet, Take 1 tablet (4 mg total) by mouth every 8 (eight) hours as needed for up to 14 doses for nausea or vomiting. (Patient not taking: Reported on 06/21/2019), Disp: 14 tablet, Rfl: 0  Current Facility-Administered Medications:  .  ondansetron (ZOFRAN) tablet 4 mg, 4 mg, Oral, Once, Vanga, Tally Due, MD .  promethazine (PHENERGAN) tablet 12.5 mg, 12.5 mg, Oral, Once, Vanga, Tally Due, MD  No Known Allergies  I personally reviewed active problem list, medication list, allergies, health maintenance, notes from last encounter, lab results with the patient/caregiver today.   ROS  Constitutional: Negative for fever or weight change.   Respiratory: Negative for cough and shortness of breath.   Cardiovascular: Negative for chest pain or palpitations.  Gastrointestinal: Negative for abdominal pain, no bowel changes.  Musculoskeletal: Negative for gait problem or joint swelling.  Skin: Negative for rash.  Neurological: Negative for dizziness or headache.  No other specific complaints in a complete review of systems (except as listed in HPI above).  Objective  Virtual encounter, vitals not obtained.  Body mass index is 27.34 kg/m.  Physical Exam  Pulmonary/Chest: Effort normal. No respiratory distress. Speaking in complete sentences Neurological: Pt is alert and oriented to person, place, and time. Speech is normal. Psychiatric: Patient has a normal mood and affect. behavior is normal. Judgment and thought content normal.  No results found for this or any previous visit (from the past 72 hour(s)).  PHQ2/9: Depression screen [facility] 2/9 06/21/2019 02/16/2019 11/08/2018 11/02/2018 10/18/2018  Decreased Interest 0 0 0 0 0  Down, Depressed, Hopeless 1 0 0 0 0  PHQ - 2 Score 1 0 0 0 0  Altered sleeping 1 0 - 0 0  Tired, decreased energy 1 1 1 1  0  Change in appetite 2 0 0 2 0  Feeling bad or failure about yourself  2 0 0 0 0  Trouble concentrating 1 0 0 0 0  Moving slowly or fidgety/restless 0 0 0 0 0  Suicidal thoughts 0 0 0 0 0  PHQ-9 Score 8 1 - 3 0  Difficult doing work/chores Somewhat difficult Not difficult at all Not difficult at all Not difficult at all Not difficult at all  Some recent data might be hidden   PHQ-2/9 Result is positive.    Fall Risk: Fall Risk  06/21/2019 02/16/2019 11/08/2018 11/02/2018 10/18/2018  Falls in the past year? 0 0 0 0 0  Number falls in past yr: 0 0 0 0 0  Injury with Fall? 0 0 0 0 0  Follow up - Falls evaluation completed - Falls prevention discussed Falls evaluation completed    Assessment & Plan  1. Benign essential HTN - Stable; last CMP reviewed in  Sept and WNL  2. Lower  extremity edema - Seeing Dr. Lucky Cowboy in Oct  3. Varicose veins of both lower extremities with pain - Seeing Dr. Lucky Cowboy in Oct  4. Atherosclerosis of aorta (HCC) - Statin therapy - Lipid panel  5. Hypercholesterolemia without hypertriglyceridemia - Statin therapy - Lipid panel  6. CVA, old, hemiparesis ([place]) - Statin therapy - Lipid panel - Ambulatory referral to Physical Therapy  7. Crohn's disease of small intestine without complication ([place]) - Seeing GI, having infusions  8. Immunosuppressed status ([place]) - Seeing GI, having infusions per their recommendations  9. Adult hypothyroidism - Compliant with medication - TSH  10. H/O malignant neoplasm of breast - MM 3D SCREEN BREAST BILATERAL; Future  11. Chronic neck pain - Gabapentin QHS  12. Arthritis of right hand - Stable  13. Osteopenia, unspecified location - DG Bone Density; Future - VITAMIN D 25 Hydroxy (Vit-D Deficiency, Fractures)  14. Vitamin D deficiency - DG Bone Density; Future - VITAMIN D 25 Hydroxy (Vit-D Deficiency, Fractures)  I discussed the assessment and treatment plan with the patient. The patient was provided an opportunity to ask questions and all were answered. The patient agreed with the plan and demonstrated an understanding of the instructions.  The patient was advised to call back or seek an in-person evaluation if the symptoms worsen or if the condition fails to improve as anticipated.  I provided 28 minutes of non-face-to-face time during this encounter.

## 2019-06-25 ENCOUNTER — Ambulatory Visit (INDEPENDENT_AMBULATORY_CARE_PROVIDER_SITE_OTHER): Payer: Medicare Other | Admitting: Emergency Medicine

## 2019-06-25 ENCOUNTER — Other Ambulatory Visit: Payer: Self-pay

## 2019-06-25 DIAGNOSIS — Z23 Encounter for immunization: Secondary | ICD-10-CM | POA: Diagnosis not present

## 2019-06-25 NOTE — Progress Notes (Signed)
fluad

## 2019-06-26 ENCOUNTER — Other Ambulatory Visit: Payer: Self-pay | Admitting: Family Medicine

## 2019-06-26 DIAGNOSIS — I69359 Hemiplegia and hemiparesis following cerebral infarction affecting unspecified side: Secondary | ICD-10-CM

## 2019-06-26 DIAGNOSIS — I700 Atherosclerosis of aorta: Secondary | ICD-10-CM

## 2019-06-26 DIAGNOSIS — E7800 Pure hypercholesterolemia, unspecified: Secondary | ICD-10-CM

## 2019-06-26 LAB — LIPID PANEL
Cholesterol: 247 mg/dL — ABNORMAL HIGH (ref <200)
HDL: 99 mg/dL (ref >=50)
LDL Cholesterol (Calc): 125 mg/dL (calc) — ABNORMAL HIGH
Non-HDL Cholesterol (Calc): 148 mg/dL (calc) — ABNORMAL HIGH (ref <130)
Total CHOL/HDL Ratio: 2.5 (calc) (ref <5.0)
Triglycerides: 115 mg/dL (ref <150)

## 2019-06-26 LAB — TSH: TSH: 1.99 mIU/L (ref 0.40–4.50)

## 2019-06-26 LAB — VITAMIN D 25 HYDROXY (VIT D DEFICIENCY, FRACTURES): Vit D, 25-Hydroxy: 59 ng/mL (ref 30–100)

## 2019-06-26 MED ORDER — ATORVASTATIN CALCIUM 80 MG PO TABS: 80.0000 mg | ORAL_TABLET | Freq: Every day | ORAL | 3 refills | Status: DC

## 2019-06-28 ENCOUNTER — Other Ambulatory Visit: Payer: Self-pay

## 2019-06-28 ENCOUNTER — Ambulatory Visit: Payer: Medicare Other | Attending: Family Medicine

## 2019-06-28 DIAGNOSIS — M79641 Pain in right hand: Secondary | ICD-10-CM

## 2019-06-28 DIAGNOSIS — R2681 Unsteadiness on feet: Secondary | ICD-10-CM | POA: Diagnosis present

## 2019-06-28 DIAGNOSIS — M21372 Foot drop, left foot: Secondary | ICD-10-CM | POA: Diagnosis not present

## 2019-06-28 DIAGNOSIS — M79642 Pain in left hand: Secondary | ICD-10-CM | POA: Diagnosis present

## 2019-06-28 DIAGNOSIS — M6281 Muscle weakness (generalized): Secondary | ICD-10-CM

## 2019-06-28 NOTE — Therapy (Signed)
[place] [facility] [address] [place], [place], [address] Phone: [phone]   Fax:  [phone]  Physical Therapy Evaluation  Patient Details  Name: Carolyn Campbell MRN: [id number] Date of Birth: [birth year] Referring Provider (PT): Raelyn Ensign, FNP   Encounter Date: 06/28/2019  PT End of Session - 06/28/19 1630    Visit Number  1    Number of Visits  25    Date for PT Re-Evaluation  09/20/19    PT Start Time  1630    PT Stop Time  1730    PT Time Calculation (min)  60 min    Equipment Utilized During Treatment  Gait belt    Activity Tolerance  Patient tolerated treatment well    Behavior During Therapy  [facility] for tasks assessed/performed       Past Medical History:  Diagnosis Date  . Abdominal pain, epigastric   . Allergic rhinitis   . Anginal pain ([place])    states MD said it was GERD  . Arthritis   . Asthma   . Breast cancer ([place]) 2006   LT LUMPECTOMY  . CD (Crohn's disease) ([place]) 05/21/2015   FOLLOWED BY GI   . Clotting disorder ([place])   . Cognitive deficits as late effect of cerebrovascular disease   . COPD (chronic obstructive pulmonary disease) ([place])   . Crohn's disease of both small and large intestine with rectal bleeding ([place]) 12/04/2014  . Depression    Currently taking zoloft.  . 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 Dyspnea   . Esophageal reflux   . Fever blister 07/19/2018  . Glaucoma    vitreous degeneration  . History of kidney stones   . Hyperlipidemia   . Hypertension   . Hypothyroidism   . Occlusion, cerebral artery    NOS w/infarction  . Osteoporosis   . Personal history of radiation therapy 2006   BREAST CA  . Stroke ([place])   . Ulcer     Past Surgical History:  Procedure Laterality Date  . BREAST BIOPSY Left 2006   POS  . BREAST LUMPECTOMY Left 09/20/2004   positive  . BREAST SURGERY Left    malignant biopsy  . CARDIAC CATHETERIZATION    . COLONOSCOPY  2015  .  COLONOSCOPY WITH PROPOFOL N/A 06/08/2017   Procedure: COLONOSCOPY WITH PROPOFOL;  Surgeon: Lin Landsman, MD;  Location: [place];  Service: Gastroenterology;  Laterality: N/A;  . COLONOSCOPY WITH PROPOFOL N/A 03/08/2019   Procedure: COLONOSCOPY WITH PROPOFOL;  Surgeon: Lin Landsman, MD;  Location[facility] ENDOSCOPY;  Service: Gastroenterology;  Laterality: N/A;  . ESOPHAGOGASTRODUODENOSCOPY  2015  . ESOPHAGOGASTRODUODENOSCOPY (EGD) WITH PROPOFOL N/A 06/08/2017   Procedure: ESOPHAGOGASTRODUODENOSCOPY (EGD) WITH PROPOFOL;  Surgeon: Lin Landsman, MD;  Location: [place];  Service: Gastroenterology;  Laterality: N/A;  . ESOPHAGOGASTRODUODENOSCOPY (EGD) WITH PROPOFOL N/A 03/08/2019   Procedure: ESOPHAGOGASTRODUODENOSCOPY (EGD) WITH PROPOFOL;  Surgeon: Lin Landsman, MD;  Location: [facility] ENDOSCOPY;  Service: Gastroenterology;  Laterality: N/A;  . FINGER SURGERY     Right small finger  . FRACTURE SURGERY    . [place] STUDY  2015  . TUBAL LIGATION      There were no vitals filed for this visit.   Subjective Assessment - 06/28/19 1655    Subjective  evaluation for chronic CVA    Pertinent History  Pt presents today with reports that she has recently been having more difficulty with L  foot drop that initially began in 2008 after a CVA.  She states that she feels like her foot drop is worse when she fatigues or gets forgetful, stating that she forgets to pick her leg up high enough to achieve foot clearance, causing her to trip more often.   Pt reports that she has had back pain for years, and that over a month ago, she had some N/T in her anterior and posterior thigh for about a week, but reports that it has improved; She does not report any changes in her foot drop with these episodes.  She reports mildly increased swelling in RLE below the knee, and is seeing a vascular surgeon in the next few weeks.  She reports that she has an exercise bike and treadmill  at home, but does not currently use them, but would like to get to the point where she can consistently use them safely.  Additionally, pt reports decreased R hand strength that she equates to arthritis.    Limitations  Walking    How long can you sit comfortably?  no limitations    How long can you walk comfortably?  up to 20 min    Patient Stated Goals  be able to improve balance    Currently in Pain?  No/denies         [facility] PT Assessment - 06/28/19 1636      Assessment   Medical Diagnosis  CVA in 2008, L foot drop, R handed weakness    Referring Provider (PT)  Raelyn Ensign, FNP    Onset Date/Surgical Date  06/21/07    Hand Dominance  Right    Next MD Visit  07/13/2019   vascular sugery   Prior Therapy  PT, OT, SLP  OP therapies in 2008      Precautions   Precautions  None      Restrictions   Weight Bearing Restrictions  No      Balance Screen   Has the patient fallen in the past 6 months  No    Has the patient had a decrease in activity level because of a fear of falling?   No    Is the patient reluctant to leave their home because of a fear of falling?   No      Home Environment   Living Environment  Private residence    Living Arrangements  Spouse/significant other    Type of [place] to enter    Entrance Stairs-Number of Steps  3    Entrance Stairs-Rails  Right    Home Layout  Two level;Able to live on main level with bedroom/bathroom    Alternate Level Stairs-Number of Steps  1 flight to/from basement    Alternate Level Stairs-Rails  Right    Home Equipment  Cane - single point      Prior Function   Level of Independence  Independent    Vocation  On disability    Leisure  sewing, reading, playing word games      Cognition   Overall Cognitive Status  Within Functional Limits for tasks assessed      Standardized Balance Assessment   Standardized Balance Assessment  Berg Balance Test;10 meter walk test;Timed Up and Go Test;Dynamic  Gait Index      Berg Balance Test   Sit to Stand  Able to stand without using hands and stabilize independently    Standing Unsupported  Able to stand  safely 2 minutes    Sitting with Back Unsupported but Feet Supported on Floor or Stool  Able to sit safely and securely 2 minutes    Stand to Sit  Sits safely with minimal use of hands    Transfers  Able to transfer safely, minor use of hands    Standing Unsupported with Eyes Closed  Able to stand 10 seconds safely    Standing Unsupported with Feet Together  Able to place feet together independently and stand 1 minute safely    From Standing, Reach Forward with Outstretched Arm  Can reach forward >12 cm safely (5")    From Standing Position, Pick up Object from Floor  Able to pick up shoe safely and easily    From Standing Position, Turn to Look Behind Over each Shoulder  Looks behind from both sides and weight shifts well    Turn 360 Degrees  Able to turn 360 degrees safely one side only in 4 seconds or less    Standing Unsupported, Alternately Place Feet on Step/Stool  Able to stand independently and complete 8 steps >20 seconds    Standing Unsupported, One Foot in Front  Able to take small step independently and hold 30 seconds    Standing on One Leg  Tries to lift leg/unable to hold 3 seconds but remains standing independently    Total Score  48      Dynamic Gait Index   Level Surface  Normal    Change in Gait Speed  Normal    Gait with Horizontal Head Turns  Normal    Gait with Vertical Head Turns  Normal    Gait and Pivot Turn  Mild Impairment    Step Over Obstacle  Severe Impairment    Step Around Obstacles  Normal    Steps  Moderate Impairment    Total Score  18          Objective measurements completed on examination: See above findings.     SUBJECTIVE Chief complaint: Pt presents today with reports that she has recently been having more difficulty with L foot drop that initially began in 2008 after a CVA.  She states  that she feels like her foot drop is worse when she fatigues or gets forgetful, stating that she forgets to pick her leg up high enough to achieve foot clearance, causing her to trip more often.   Pt reports that she has had back pain for years, and that over a month ago, she had some N/T in her anterior and posterior thigh for about a week, but reports that it has improved; She does not report any changes in her foot drop with these episodes.  She reports mildly increased swelling in RLE below the knee, and is seeing a vascular surgeon in the next few weeks.  She reports that she has an exercise bike and treadmill at home, but does not currently use them, but would like to get to the point where she can consistently use them safely.  Additionally, pt reports decreased R hand strength that she equates to arthritis.      Onset: 2008 Imaging: none performed recently, per patient report Recent changes in overall health/medication: No Directional pattern for falls: None Prior history of physical therapy for balance: yes OP therapy services in 2008 for CVA Follow-up appointment with MD: vascular surgery in october Red flags (bowel/bladder changes, saddle paresthesia, personal history of cancer, chills/fever, night sweats, unrelenting pain) Negative  OBJECTIVE  MUSCULOSKELETAL: Tremor:  Absent Bulk: Normal Tone: Normal  Posture No gross abnormalities noted in standing or seated posture   Strength R/L 5/4 Hip flexion 5/4 Hip external rotation 5/4 Hip internal rotation 4-/4- Hip extension  4-/4- Hip abduction 3+/3+ Hip adduction 5/5 Knee extension 5/4 Knee flexion 5/4 Ankle Plantarflexion 5/3+ Ankle Dorsiflexion   NEUROLOGICAL:   Sensation Grossly intact to light touch bilateral UEs/LEs as determined by testing dermatomes C2-T2/L2-S2 respectively Proprioception and hot/cold testing deferred on this date    Coordination/Cerebellar Finger to Nose: mild dysmetria with L Heel to Shin:  mild deficits on L Rapid alternating movements: mild deficits on L Finger Opposition: WNL Pronator Drift: Negative  FUNCTIONAL OUTCOME MEASURES   Results Comments  BERG 48/56 Fall risk, in need of intervention  DGI 18/24   FGA Deferred    6 Minute Walk Test Deferred   10 Meter Gait Speed Deferred     POSTURAL CONTROL TESTS:  Not performed today    OCULOMOTOR / VESTIBULAR TESTING: Not performed today    ASSESSMENT Clinical Impression: Pt is a pleasant 63 year-old female referred for worsening L foot drop following a CVA from 2008 and RUE weakness.  PT examination reveals deficits in the BERG, scoring a 48/56, displaying difficulty with reaching forward while maintaining her balance, maintaining her balance while turning 360 degrees, maintaining her balance while in a modified semi tandem stance and in single leg balance, and a slowed cadence and difficulty maintaining balance with toe tapping on to a 6" box, indicating that she is at an increased risk for falls.  She scored an 18/24 on the DGI, demonstrating difficulty with performing a pivot turn and stopping without losing her balance, marked difficulty with stepping over objects, and requiring the use of a railing both ascending and descending the stairs, while also requiring a step to pattern while descending the stairs.  She displays grossly decreased strength on the L compared to the R, with decreased strength in hip extension, hip abduction and hip adduction bilaterally.  UE strength assessment,  6MWT, 10MWT, and FGA were deferred today due to time constraints, and will be assessed in future sessions.  Pt presents with deficits in strength, gait and balance. Pt will benefit from skilled PT services to address deficits in balance and decrease risk for future falls and to return to full functional abilities at home.      PT Education - 06/29/19 1428    Education Details  PT role, POC,expectations of therapy    Person(s) Educated   Patient    Methods  Explanation    Comprehension  Verbalized understanding       PT Short Term Goals - 06/29/19 1201      PT SHORT TERM GOAL #1   Title  Pt will be independent with initial HEP in order to improve strength and balance in order to decrease fall risk and improve function at home and work.    Time  4    Period  Weeks    Status  New    Target Date  07/27/19        PT Long Term Goals - 06/29/19 1201      PT LONG TERM GOAL #1   Title  Pt will improve BERG by at least 4 points in order to demonstrate clinically significant improvement in balance.    Baseline  06/28/19: 48/56    Time  12    Period  Weeks    Status  New    Target  Date  09/19/19      PT LONG TERM GOAL #2   Title  Pt will improve DGI by at least 3 points in order to demonstrate clinically significant improvement in balance and decreased risk for falls.    Baseline  06/28/19: 18/24    Time  12    Period  Weeks    Status  New    Target Date  09/20/19      PT LONG TERM GOAL #3   Title  Patient will increase BLE gross strength to 4+/5 as to improve functional strength for independent gait, increased standing tolerance and increased ADL ability.    Baseline  06/28/19: R/L: 5/4 Hip flexion, 5/4 Hip external rotation, 5/4 Hip internal rotation, 4-/4- Hip extension, 4-/4- Hip abduction, 3+/3+ Hip adduction, 5/5 Knee extension, 5/4 Knee flexion, 5/4 Ankle Plantarflexion, 5/3+ Ankle Dorsiflexion    Time  12    Period  Weeks    Status  New    Target Date  09/20/19             Plan - 06/29/19 1123    Clinical Impression Statement  Pt is a pleasant 63 year-old female referred for worsening L foot drop following a CVA from 2008 and RUE weakness.  PT examination reveals deficits in the BERG, scoring a 48/56, displaying difficulty with reaching forward while maintaining her balance, maintaining her balance while turning 360 degrees, maintaining her balance while in a modified semi tandem stance and in single  leg balance, and a slowed cadence and difficulty maintaining balance with toe tapping on to a 6" box, indicating that she is at an increased risk for falls.  She scored an 18/24 on the DGI, demonstrating difficulty with performing a pivot turn and stopping without losing her balance, marked difficulty with stepping over objects, and requiring the use of a railing both ascending and descending the stairs, while also requiring a step to pattern while descending the stairs.  She displays grossly decreased strength on the L compared to the R, with decreased strength in hip extension, hip abduction and hip adduction bilaterally.  UE strength assessment,  6MWT, 10MWT, and FGA were deferred today due to time constraints, and will be assessed in future sessions.  Pt presents with deficits in strength, gait and balance. Pt will benefit from skilled PT services to address deficits in balance and decrease risk for future falls and to return to full functional abilities at home.    Personal Factors and Comorbidities  Comorbidity 3+;Time since onset of injury/illness/exacerbation    Comorbidities  Hx of Cancer, Hx of CVA  COPD, Depression, HTN, HLD, osteoporosis    Examination-Activity Limitations  Locomotion Level    Examination-Participation Restrictions  Community Activity    Stability/Clinical Decision Making  Evolving/Moderate complexity    Clinical Decision Making  Moderate    Rehab Potential  Fair    PT Frequency  2x / week    PT Duration  12 weeks    PT Treatment/Interventions  ADLs/Self Care Home Management;Canalith Repostioning;Cryotherapy;Electrical Stimulation;Moist Heat;Traction;Ultrasound;Gait training;Orthotic Fit/Training;Functional mobility training;Therapeutic activities;Therapeutic exercise;Balance training;Neuromuscular re-education;Patient/family education;Manual techniques;Energy conservation;Joint Manipulations;Vestibular    PT Next Visit Plan  LEFS, 6MWT, 10MWT, FGA, UE strength assessment,  grip strength testing    PT Home Exercise Plan  none provided today    Consulted and Agree with Plan of Care  Patient       Patient will benefit from skilled therapeutic intervention in order to improve the following deficits and impairments:  Abnormal gait, Decreased coordination, Decreased activity tolerance, Decreased endurance, Decreased strength, Decreased balance, Difficulty walking  Visit Diagnosis: Foot drop, left  Unsteadiness on feet  Muscle weakness (generalized)  Pain in right hand  Pain in left hand     Problem List Patient Active Problem List   Diagnosis Date Noted  . Varicose veins of both lower extremities with pain 07/19/2018  . Arthritis of right hand 12/21/2017  . Immunosuppressed status ([place]) 12/21/2017  . Major depression in remission ([place]) 12/21/2017  . Fibrocystic breast changes 06/22/2017  . Osteopenia 10/18/2016  . Atherosclerosis of aorta ([place]) 12/19/2015  . Vitreous degeneration 05/21/2015  . Bad memory 05/21/2015  . Glaucoma associated with chamber angle anomalies 05/21/2015  . Dermatitis, eczematoid 05/21/2015  . CD (Crohn's disease) ([place]) 05/21/2015  . H/O malignant neoplasm of breast 05/21/2015  . Crohn's disease of both small and large intestine with rectal bleeding ([place]) 12/04/2014  . Solitary pulmonary nodule 11/07/2012  . 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 Cognitive deficits as late effect of cerebrovascular disease 01/20/2010  . CVA, old, hemiparesis ([place]) 04/28/2009  . Adult hypothyroidism 06/12/2008  . Acid reflux 05/23/2007  . Hypercholesterolemia without hypertriglyceridemia 01/18/2007  . Benign essential HTN 01/18/2007     Lutricia Horsfall, SPT   [place] [facility]IN [facility] SERVICES [address] [place], [place], [address] Phone: [phone]   Fax:  [phone]  Name: Carolyn Campbell MRN: [id number] Date of Birth: Feb 07, 1956

## 2019-07-02 ENCOUNTER — Ambulatory Visit: Payer: Medicare Other

## 2019-07-02 ENCOUNTER — Other Ambulatory Visit: Payer: Self-pay

## 2019-07-02 DIAGNOSIS — M6281 Muscle weakness (generalized): Secondary | ICD-10-CM

## 2019-07-02 DIAGNOSIS — R2681 Unsteadiness on feet: Secondary | ICD-10-CM

## 2019-07-02 DIAGNOSIS — M21372 Foot drop, left foot: Secondary | ICD-10-CM | POA: Diagnosis not present

## 2019-07-02 NOTE — Therapy (Signed)
[place] MAIN [place] [address] [place], [place], [address] Phone: [phone]   Fax:  [phone]  Physical Therapy Treatment  Patient Details  Name: Carolyn Campbell MRN: [id number] Date of Birth: 1955-10-06 Referring Provider (PT): Raelyn Ensign, FNP   Encounter Date: 07/02/2019  PT End of Session - 07/02/19 1626    Visit Number  2    Number of Visits  25    Date for PT Re-Evaluation  09/20/19    PT Start Time  [id number]    PT Stop Time  [id number]    PT Time Calculation (min)  39 min    Equipment Utilized During Treatment  Gait belt    Activity Tolerance  Patient tolerated treatment well    Behavior During Therapy  [facility] for tasks assessed/performed       Past Medical History:  Diagnosis Date  . Abdominal pain, epigastric   . Allergic rhinitis   . Anginal pain ([place])    states MD said it was GERD  . Arthritis   . Asthma   . Breast cancer ([place]) 2006   LT LUMPECTOMY  . CD (Crohn's disease) ([place]) 05/21/2015   FOLLOWED BY GI   . Clotting disorder ([place])   . Cognitive deficits as late effect of cerebrovascular disease   . COPD (chronic obstructive pulmonary disease) ([place])   . Crohn's disease of both small and large intestine with rectal bleeding ([place]) 12/04/2014  . Depression    Currently taking zoloft.  . 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 Dyspnea   . Esophageal reflux   . Fever blister 07/19/2018  . Glaucoma    vitreous degeneration  . History of kidney stones   . Hyperlipidemia   . Hypertension   . Hypothyroidism   . Occlusion, cerebral artery    NOS w/infarction  . Osteoporosis   . Personal history of radiation therapy 2006   BREAST CA  . Stroke ([place])   . Ulcer     Past Surgical History:  Procedure Laterality Date  . BREAST BIOPSY Left 2006   POS  . BREAST LUMPECTOMY Left 09/20/2004   positive  . BREAST SURGERY Left    malignant biopsy  . CARDIAC CATHETERIZATION    . COLONOSCOPY  2015  .  COLONOSCOPY WITH PROPOFOL N/A 06/08/2017   Procedure: COLONOSCOPY WITH PROPOFOL;  Surgeon: Lin Landsman, MD;  Location: [place];  Service: Gastroenterology;  Laterality: N/A;  . COLONOSCOPY WITH PROPOFOL N/A 03/08/2019   Procedure: COLONOSCOPY WITH PROPOFOL;  Surgeon: Lin Landsman, MD;  Location: [facility] ENDOSCOPY;  Service: Gastroenterology;  Laterality: N/A;  . ESOPHAGOGASTRODUODENOSCOPY  2015  . ESOPHAGOGASTRODUODENOSCOPY (EGD) WITH PROPOFOL N/A 06/08/2017   Procedure: ESOPHAGOGASTRODUODENOSCOPY (EGD) WITH PROPOFOL;  Surgeon: Lin Landsman, MD;  Location: [place];  Service: Gastroenterology;  Laterality: N/A;  . ESOPHAGOGASTRODUODENOSCOPY (EGD) WITH PROPOFOL N/A 03/08/2019   Procedure: ESOPHAGOGASTRODUODENOSCOPY (EGD) WITH PROPOFOL;  Surgeon: Lin Landsman, MD;  Location: [facility] ENDOSCOPY;  Service: Gastroenterology;  Laterality: N/A;  . FINGER SURGERY     Right small finger  . FRACTURE SURGERY    . [place] STUDY  2015  . TUBAL LIGATION      There were no vitals filed for this visit.  Subjective Assessment - 07/02/19 1624    Subjective  Patient is having a laser procedure done to LLE on 07/13/19. Requests to come just once a week.    Pertinent History  Pt presents  today with reports that she has recently been having more difficulty with L foot drop that initially began in 2008 after a CVA.  She states that she feels like her foot drop is worse when she fatigues or gets forgetful, stating that she forgets to pick her leg up high enough to achieve foot clearance, causing her to trip more often.   Pt reports that she has had back pain for years, and that over a month ago, she had some N/T in her anterior and posterior thigh for about a week, but reports that it has improved; She does not report any changes in her foot drop with these episodes.  She reports mildly increased swelling in RLE below the knee, and is seeing a vascular surgeon in the next  few weeks.  She reports that she has an exercise bike and treadmill at home, but does not currently use them, but would like to get to the point where she can consistently use them safely.  Additionally, pt reports decreased R hand strength that she equates to arthritis.    Limitations  Walking    How long can you sit comfortably?  no limitations    How long can you walk comfortably?  up to 20 min    Patient Stated Goals  be able to improve balance    Currently in Pain?  No/denies      Is having a laser procedure on the 07/13/19.    10 MWT : 7 seconds  6 MWT: 1410 ft  LEFS: 49/80   requests coming just once a week.    Access Code: [email or web address]  URL: [email or web address]  Date: 07/02/2019  Prepared by: Janna Arch   Exercises Standing March with Unilateral Counter Support - 10 reps - 2 sets - 5 hold - 1x daily - 7x weekly Heel rises with counter support - 10 reps - 2 sets - 5 hold - 1x daily - 7x weekly Standing Toe Raises at Chair - 10 reps - 2 sets - 5 hold - 1x daily - 7x weekly Seated Ankle Alphabet - 10 reps - 2 sets - 5 hold - 1x daily - 7x weekly Seated Long Arc Quad - 10 reps - 2 sets - 5 hold - 1x daily - 7x weekly Sit to Stand - 10 reps - 2 sets - 5 hold - 1x daily - 7x weekly      Pt educated throughout session about proper posture and technique with exercises. Improved exercise technique, movement at target joints, use of target muscles after min to mod verbal, visual, tactile cues.          PT Education - 07/02/19 1626    Education Details  HEP, exercise technique, stability    Person(s) Educated  Patient    Methods  Explanation;Demonstration;Tactile cues;Verbal cues    Comprehension  Verbalized understanding;Returned demonstration;Verbal cues required;Tactile cues required       PT Short Term Goals - 06/29/19 1201      PT SHORT TERM GOAL #1   Title  Pt will be independent with initial HEP in order to improve strength and balance in order to  decrease fall risk and improve function at home and work.    Time  4    Period  Weeks    Status  New    Target Date  07/27/19        PT Long Term Goals - 06/29/19 1201      PT LONG TERM GOAL #1  Title  Pt will improve BERG by at least 4 points in order to demonstrate clinically significant improvement in balance.    Baseline  06/28/19: 48/56    Time  12    Period  Weeks    Status  New    Target Date  09/20/19      PT LONG TERM GOAL #2   Title  Pt will improve DGI by at least 3 points in order to demonstrate clinically significant improvement in balance and decreased risk for falls.    Baseline  06/28/19: 18/24    Time  12    Period  Weeks    Status  New    Target Date  09/20/19      PT LONG TERM GOAL #3   Title  Patient will increase BLE gross strength to 4+/5 as to improve functional strength for independent gait, increased standing tolerance and increased ADL ability.    Baseline  06/28/19: R/L: 5/4 Hip flexion, 5/4 Hip external rotation, 5/4 Hip internal rotation, 4-/4- Hip extension, 4-/4- Hip abduction, 3+/3+ Hip adduction, 5/5 Knee extension, 5/4 Knee flexion, 5/4 Ankle Plantarflexion, 5/3+ Ankle Dorsiflexion    Time  12    Period  Weeks    Status  New    Target Date  09/20/19            Plan - 07/02/19 1740    Clinical Impression Statement  Patient requests coming just once a week rather than the twice scheduled. Patient educated and given HEP protocol. Patient performed 6 MWT with 1410 ft without AD with increasing foot drag with prolonged ambulation. She performed 10 MWT with functional gait speed of >1.0 m/s. LEFS scored 49/80.  Pt will benefit from skilled PT services to address deficits in balance and decrease risk for future falls and to return to full functional abilities at home.    Personal Factors and Comorbidities  Comorbidity 3+;Time since onset of injury/illness/exacerbation    Comorbidities  Hx of Cancer, Hx of CVA  COPD, Depression, HTN, HLD,  osteoporosis    Examination-Activity Limitations  Locomotion Level    Examination-Participation Restrictions  Community Activity    Stability/Clinical Decision Making  Evolving/Moderate complexity    Rehab Potential  Fair    PT Frequency  2x / week    PT Duration  12 weeks    PT Treatment/Interventions  ADLs/Self Care Home Management;Canalith Repostioning;Cryotherapy;Electrical Stimulation;Moist Heat;Traction;Ultrasound;Gait training;Orthotic Fit/Training;Functional mobility training;Therapeutic activities;Therapeutic exercise;Balance training;Neuromuscular re-education;Patient/family education;Manual techniques;Energy conservation;Joint Manipulations;Vestibular    PT Home Exercise Plan  none provided today    Consulted and Agree with Plan of Care  Patient       Patient will benefit from skilled therapeutic intervention in order to improve the following deficits and impairments:  Abnormal gait, Decreased coordination, Decreased activity tolerance, Decreased endurance, Decreased strength, Decreased balance, Difficulty walking  Visit Diagnosis: Unsteadiness on feet  Foot drop, left  Muscle weakness (generalized)     Problem List Patient Active Problem List   Diagnosis Date Noted  . Varicose veins of both lower extremities with pain 07/19/2018  . Arthritis of right hand 12/21/2017  . Immunosuppressed status ([place]) 12/21/2017  . Major depression in remission ([place]) 12/21/2017  . Fibrocystic breast changes 06/22/2017  . Osteopenia 10/18/2016  . Atherosclerosis of aorta ([place]) 12/19/2015  . Vitreous degeneration 05/21/2015  . Bad memory 05/21/2015  . Glaucoma associated with chamber angle anomalies 05/21/2015  . Dermatitis, eczematoid 05/21/2015  . CD (Crohn's disease) ([place]) 05/21/2015  . H/O malignant  neoplasm of breast 05/21/2015  . Crohn's disease of both small and large intestine with rectal bleeding ([place]) 12/04/2014  . Solitary pulmonary nodule 11/07/2012  . Dysarthria as late  effect of cerebrovascular disease 04/22/2010  . Cognitive deficits as late effect of cerebrovascular disease 01/20/2010  . CVA, old, hemiparesis ([place]) 04/28/2009  . Adult hypothyroidism 06/12/2008  . Acid reflux 05/23/2007  . Hypercholesterolemia without hypertriglyceridemia 01/18/2007  . Benign essential HTN 01/18/2007    Janna Arch, PT, DPT   07/02/2019, 5:41 PM  [place] MAIN [facility] SERVICES [address] [place], [place], [address] Phone: [phone]   Fax:  [phone]  Name: Carolyn Campbell MRN: [id number] Date of Birth: 07/09/1956

## 2019-07-03 ENCOUNTER — Ambulatory Visit: Payer: Medicare Other | Admitting: Physical Therapy

## 2019-07-04 ENCOUNTER — Ambulatory Visit: Payer: Medicare Other

## 2019-07-05 ENCOUNTER — Ambulatory Visit: Payer: Medicare Other

## 2019-07-09 ENCOUNTER — Other Ambulatory Visit: Payer: Self-pay

## 2019-07-09 ENCOUNTER — Ambulatory Visit: Payer: Medicare Other | Admitting: Physical Therapy

## 2019-07-09 ENCOUNTER — Ambulatory Visit: Payer: Medicare Other

## 2019-07-09 DIAGNOSIS — R2681 Unsteadiness on feet: Secondary | ICD-10-CM

## 2019-07-09 DIAGNOSIS — M6281 Muscle weakness (generalized): Secondary | ICD-10-CM

## 2019-07-09 DIAGNOSIS — M21372 Foot drop, left foot: Secondary | ICD-10-CM | POA: Diagnosis not present

## 2019-07-09 NOTE — Therapy (Signed)
[place] MAIN [facility] SERVICES [address] [place], [place], [address] Phone: [phone]   Fax:  [phone]  Physical Therapy Treatment  Patient Details  Name: Carolyn Campbell MRN: [id number] Date of Birth: 1956-02-14 Referring Provider (PT): Raelyn Ensign, FNP   Encounter Date: 07/09/2019  PT End of Session - 07/09/19 1739    Visit Number  3    Number of Visits  25    Date for PT Re-Evaluation  09/20/19    Authorization Type  3/10    PT Start Time  1611    PT Stop Time  1645    PT Time Calculation (min)  34 min    Equipment Utilized During Treatment  Gait belt    Activity Tolerance  Patient tolerated treatment well    Behavior During Therapy  [facility] for tasks assessed/performed       Past Medical History:  Diagnosis Date  . Abdominal pain, epigastric   . Allergic rhinitis   . Anginal pain ([place])    states MD said it was GERD  . Arthritis   . Asthma   . Breast cancer ([place]) 2006   LT LUMPECTOMY  . CD (Crohn's disease) ([place]) 05/21/2015   FOLLOWED BY GI   . Clotting disorder ([place])   . Cognitive deficits as late effect of cerebrovascular disease   . COPD (chronic obstructive pulmonary disease) ([place])   . Crohn's disease of both small and large intestine with rectal bleeding ([place]) 12/04/2014  . Depression    Currently taking zoloft.  . 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 Dyspnea   . Esophageal reflux   . Fever blister 07/19/2018  . Glaucoma    vitreous degeneration  . History of kidney stones   . Hyperlipidemia   . Hypertension   . Hypothyroidism   . Occlusion, cerebral artery    NOS w/infarction  . Osteoporosis   . Personal history of radiation therapy 2006   BREAST CA  . Stroke ([place])   . Ulcer     Past Surgical History:  Procedure Laterality Date  . BREAST BIOPSY Left 2006   POS  . BREAST LUMPECTOMY Left 09/20/2004   positive  . BREAST SURGERY Left    malignant biopsy  . CARDIAC CATHETERIZATION     . COLONOSCOPY  2015  . COLONOSCOPY WITH PROPOFOL N/A 06/08/2017   Procedure: COLONOSCOPY WITH PROPOFOL;  Surgeon: Lin Landsman, MD;  Location: [place];  Service: Gastroenterology;  Laterality: N/A;  . COLONOSCOPY WITH PROPOFOL N/A 03/08/2019   Procedure: COLONOSCOPY WITH PROPOFOL;  Surgeon: Lin Landsman, MD;  Location: [facility] ENDOSCOPY;  Service: Gastroenterology;  Laterality: N/A;  . ESOPHAGOGASTRODUODENOSCOPY  2015  . ESOPHAGOGASTRODUODENOSCOPY (EGD) WITH PROPOFOL N/A 06/08/2017   Procedure: ESOPHAGOGASTRODUODENOSCOPY (EGD) WITH PROPOFOL;  Surgeon: Lin Landsman, MD;  Location: [place];  Service: Gastroenterology;  Laterality: N/A;  . ESOPHAGOGASTRODUODENOSCOPY (EGD) WITH PROPOFOL N/A 03/08/2019   Procedure: ESOPHAGOGASTRODUODENOSCOPY (EGD) WITH PROPOFOL;  Surgeon: Lin Landsman, MD;  Location: [facility] ENDOSCOPY;  Service: Gastroenterology;  Laterality: N/A;  . FINGER SURGERY     Right small finger  . FRACTURE SURGERY    . [place] STUDY  2015  . TUBAL LIGATION      There were no vitals filed for this visit.  Subjective Assessment - 07/09/19 1736    Subjective  Patient arrived late to session, reason given on why. Has not been compliant with HEP everyday.  Pertinent History  Pt presents today with reports that she has recently been having more difficulty with L foot drop that initially began in 2008 after a CVA.  She states that she feels like her foot drop is worse when she fatigues or gets forgetful, stating that she forgets to pick her leg up high enough to achieve foot clearance, causing her to trip more often.   Pt reports that she has had back pain for years, and that over a month ago, she had some N/T in her anterior and posterior thigh for about a week, but reports that it has improved; She does not report any changes in her foot drop with these episodes.  She reports mildly increased swelling in RLE below the knee, and is seeing a  vascular surgeon in the next few weeks.  She reports that she has an exercise bike and treadmill at home, but does not currently use them, but would like to get to the point where she can consistently use them safely.  Additionally, pt reports decreased R hand strength that she equates to arthritis.    Limitations  Walking    How long can you sit comfortably?  no limitations    How long can you walk comfortably?  up to 20 min    Patient Stated Goals  be able to improve balance    Currently in Pain?  No/denies        Treatment: Matrix resisted walking : #7.5: 2x each direction, lateral, forward backwards, cueing for foot alignment, body mechanics, and speed reduction, CGA for stability     airex pad; -one foot on 7" step one foot on airex pad, hold 30 seconds, x2 trials each Le -toe taps on 7" step with SUE support 12x each LE  RTB around ankles, BUE support:  hip extension 10x each LE  Half foam roller df/pf, BUE support, cueing for body mechanics 2 minutes  Step over orange hurdle : forward step over and back each LE, challenging to patient; no UE support   Side step over orange hurdle: very challenging to L, decreasing UE support from finger tip to no UE support  Sit to stand with green step under RLE pressing 3000 gr weighted ball to ceiling with stand. 12x   Patient educated on need for compliance with HEP.   Patient arrived late to session limiting session duration, patient additionally non compliant with HEP. She was educated on need for compliance in order for progression during sessions. This is last scheduled appointment prior to her procedure this Friday. Pt will benefit from skilled PT services to address deficits in balance and decrease risk for future falls and to return to full functional abilities at home.           PT Education - 07/09/19 1738    Education Details  need for compliance with HEP    Person(s) Educated  Patient    Methods  Explanation     Comprehension  Verbalized understanding       PT Short Term Goals - 06/29/19 1201      PT SHORT TERM GOAL #1   Title  Pt will be independent with initial HEP in order to improve strength and balance in order to decrease fall risk and improve function at home and work.    Time  4    Period  Weeks    Status  New    Target Date  07/27/19        PT  Long Term Goals - 06/29/19 1201      PT LONG TERM GOAL #1   Title  Pt will improve BERG by at least 4 points in order to demonstrate clinically significant improvement in balance.    Baseline  06/28/19: 48/56    Time  12    Period  Weeks    Status  New    Target Date  09/20/19      PT LONG TERM GOAL #2   Title  Pt will improve DGI by at least 3 points in order to demonstrate clinically significant improvement in balance and decreased risk for falls.    Baseline  06/28/19: 18/24    Time  12    Period  Weeks    Status  New    Target Date  09/20/19      PT LONG TERM GOAL #3   Title  Patient will increase BLE gross strength to 4+/5 as to improve functional strength for independent gait, increased standing tolerance and increased ADL ability.    Baseline  06/28/19: R/L: 5/4 Hip flexion, 5/4 Hip external rotation, 5/4 Hip internal rotation, 4-/4- Hip extension, 4-/4- Hip abduction, 3+/3+ Hip adduction, 5/5 Knee extension, 5/4 Knee flexion, 5/4 Ankle Plantarflexion, 5/3+ Ankle Dorsiflexion    Time  12    Period  Weeks    Status  New    Target Date  09/20/19            Plan - 07/09/19 1739    Clinical Impression Statement  Patient arrived late to session limiting session duration, patient additionally non compliant with HEP. She was educated on need for compliance in order for progression during sessions. This is last scheduled appointment prior to her procedure this Friday. Pt will benefit from skilled PT services to address deficits in balance and decrease risk for future falls and to return to full functional abilities at home.     Personal Factors and Comorbidities  Comorbidity 3+;Time since onset of injury/illness/exacerbation    Comorbidities  Hx of Cancer, Hx of CVA  COPD, Depression, HTN, HLD, osteoporosis    Examination-Activity Limitations  Locomotion Level    Examination-Participation Restrictions  Community Activity    Stability/Clinical Decision Making  Evolving/Moderate complexity    Rehab Potential  Fair    PT Frequency  2x / week    PT Duration  12 weeks    PT Treatment/Interventions  ADLs/Self Care Home Management;Canalith Repostioning;Cryotherapy;Electrical Stimulation;Moist Heat;Traction;Ultrasound;Gait training;Orthotic Fit/Training;Functional mobility training;Therapeutic activities;Therapeutic exercise;Balance training;Neuromuscular re-education;Patient/family education;Manual techniques;Energy conservation;Joint Manipulations;Vestibular    PT Home Exercise Plan  none provided today    Consulted and Agree with Plan of Care  Patient       Patient will benefit from skilled therapeutic intervention in order to improve the following deficits and impairments:  Abnormal gait, Decreased coordination, Decreased activity tolerance, Decreased endurance, Decreased strength, Decreased balance, Difficulty walking  Visit Diagnosis: Unsteadiness on feet  Foot drop, left  Muscle weakness (generalized)     Problem List Patient Active Problem List   Diagnosis Date Noted  . Varicose veins of both lower extremities with pain 07/19/2018  . Arthritis of right hand 12/21/2017  . Immunosuppressed status ([place]) 12/21/2017  . Major depression in remission ([place]) 12/21/2017  . Fibrocystic breast changes 06/22/2017  . Osteopenia 10/18/2016  . Atherosclerosis of aorta ([place]) 12/19/2015  . Vitreous degeneration 05/21/2015  . Bad memory 05/21/2015  . Glaucoma associated with chamber angle anomalies 05/21/2015  . Dermatitis, eczematoid 05/21/2015  . CD (Crohn's  disease) ([place]) 05/21/2015  . H/O malignant neoplasm of  breast 05/21/2015  . Crohn's disease of both small and large intestine with rectal bleeding ([place]) 12/04/2014  . Solitary pulmonary nodule 11/07/2012  . 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 Cognitive deficits as late effect of cerebrovascular disease 01/20/2010  . CVA, old, hemiparesis ([place]) 04/28/2009  . Adult hypothyroidism 06/12/2008  . Acid reflux 05/23/2007  . Hypercholesterolemia without hypertriglyceridemia 01/18/2007  . Benign essential HTN 01/18/2007   Janna Arch, PT, DPT   07/09/2019, 5:40 PM  [place] MAIN [facility] SERVICES [address] [place], [place], [address] Phone: [phone]   Fax:  [phone]  Name: Carolyn Campbell MRN: [id number] Date of Birth: 1955/11/08

## 2019-07-11 ENCOUNTER — Ambulatory Visit: Payer: Medicare Other | Admitting: Physical Therapy

## 2019-07-13 ENCOUNTER — Other Ambulatory Visit: Payer: Self-pay

## 2019-07-13 ENCOUNTER — Ambulatory Visit (INDEPENDENT_AMBULATORY_CARE_PROVIDER_SITE_OTHER): Payer: Medicare Other | Admitting: Vascular Surgery

## 2019-07-13 VITALS — BP 125/76 | HR 77 | Resp 12 | Ht 65.0 in | Wt 144.0 lb

## 2019-07-13 DIAGNOSIS — I83812 Varicose veins of left lower extremities with pain: Secondary | ICD-10-CM

## 2019-07-13 DIAGNOSIS — I83813 Varicose veins of bilateral lower extremities with pain: Secondary | ICD-10-CM

## 2019-07-13 NOTE — Progress Notes (Signed)
Carolyn Campbell is a 63 y.o. female who presents with symptomatic venous reflux  Past Medical History:  Diagnosis Date  . Abdominal pain, epigastric   . Allergic rhinitis   . Anginal pain ([place])    states MD said it was GERD  . Arthritis   . Asthma   . Breast cancer ([place]) 2006   LT LUMPECTOMY  . CD (Crohn's disease) ([place]) 05/21/2015   FOLLOWED BY GI   . Clotting disorder ([place])   . Cognitive deficits as late effect of cerebrovascular disease   . COPD (chronic obstructive pulmonary disease) ([place])   . Crohn's disease of both small and large intestine with rectal bleeding ([place]) 12/04/2014  . Depression    Currently taking zoloft.  . 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 Dyspnea   . Esophageal reflux   . Fever blister 07/19/2018  . Glaucoma    vitreous degeneration  . History of kidney stones   . Hyperlipidemia   . Hypertension   . Hypothyroidism   . Occlusion, cerebral artery    NOS w/infarction  . Osteoporosis   . Personal history of radiation therapy 2006   BREAST CA  . Stroke ([place])   . Ulcer     Past Surgical History:  Procedure Laterality Date  . BREAST BIOPSY Left 2006   POS  . BREAST LUMPECTOMY Left 09/20/2004   positive  . BREAST SURGERY Left    malignant biopsy  . CARDIAC CATHETERIZATION    . COLONOSCOPY  2015  . COLONOSCOPY WITH PROPOFOL N/A 06/08/2017   Procedure: COLONOSCOPY WITH PROPOFOL;  Surgeon: Lin Landsman, MD;  Location: [facility];  Service: Gastroenterology;  Laterality: N/A;  . COLONOSCOPY WITH PROPOFOL N/A 03/08/2019   Procedure: COLONOSCOPY WITH PROPOFOL;  Surgeon: Lin Landsman, MD;  Location: [facility] ENDOSCOPY;  Service: Gastroenterology;  Laterality: N/A;  . ESOPHAGOGASTRODUODENOSCOPY  2015  . ESOPHAGOGASTRODUODENOSCOPY (EGD) WITH PROPOFOL N/A 06/08/2017   Procedure: ESOPHAGOGASTRODUODENOSCOPY (EGD) WITH PROPOFOL;  Surgeon: Lin Landsman, MD;  Location: [facility];  Service: Gastroenterology;   Laterality: N/A;  . ESOPHAGOGASTRODUODENOSCOPY (EGD) WITH PROPOFOL N/A 03/08/2019   Procedure: ESOPHAGOGASTRODUODENOSCOPY (EGD) WITH PROPOFOL;  Surgeon: Lin Landsman, MD;  Location: [facility] ENDOSCOPY;  Service: Gastroenterology;  Laterality: N/A;  . FINGER SURGERY     Right small finger  . FRACTURE SURGERY    . [place] STUDY  2015  . TUBAL LIGATION       Current Outpatient Medications:  .  alendronate (FOSAMAX) 70 MG tablet, TAKE 1 TABLET BY MOUTH ONCE A WEEK. TAKE WITH A FULL  GLASS OF WATER ON AN EMPTY  STOMACH., Disp: 12 tablet, Rfl: 3 .  atorvastatin (LIPITOR) 80 MG tablet, Take 1 tablet (80 mg total) by mouth daily., Disp: 90 tablet, Rfl: 3 .  diclofenac sodium (VOLTAREN) 1 % GEL, Apply 2 g topically 4 (four) times daily., Disp: 100 g, Rfl: 0 .  gabapentin (NEURONTIN) 300 MG capsule, Take 1 capsule (300 mg total) by mouth at bedtime., Disp: 90 capsule, Rfl: 0 .  hydrochlorothiazide (HYDRODIURIL) 12.5 MG tablet, Take 1 tablet (12.5 mg total) by mouth daily., Disp: 90 tablet, Rfl: 1 .  latanoprost (XALATAN) 0.005 % ophthalmic solution, Place 1 drop into both eyes at bedtime. , Disp: , Rfl:  .  levothyroxine (SYNTHROID) 50 MCG tablet, Take 1 tablet (50 mcg total) by mouth daily., Disp: 90 tablet, Rfl: 1 .  Multiple Vitamin (MULTIVITAMIN) tablet, Take 1 tablet by mouth daily.,  Disp: , Rfl:  .  omeprazole (PRILOSEC) 40 MG capsule, Take 1 capsule (40 mg total) by mouth daily before breakfast., Disp: 90 capsule, Rfl: 0 .  sertraline (ZOLOFT) 50 MG tablet, Take 0.5 tablets (25 mg total) by mouth daily., Disp: 90 tablet, Rfl: 1 .  triamcinolone (KENALOG) 0.025 % ointment, Apply 1 application topically 2 (two) times daily., Disp: 30 g, Rfl: 0 .  valACYclovir (VALTREX) 1000 MG tablet, Take 1 tablet (1,000 mg total) by mouth 2 (two) times daily., Disp: 30 tablet, Rfl: 5 .  vedolizumab (ENTYVIO) 300 MG injection, Inject into the vein., Disp: , Rfl:   No Known Allergies   Varicose veins  of both lower extremities with pain     PLAN: The patient's left lower extremity was sterilely prepped and draped. The ultrasound machine was used to visualize the saphenous vein throughout its course. A segment in the mid to upper calf was selected for access. The saphenous vein was accessed without difficulty using ultrasound guidance with a micropuncture needle. A 0.018 wire was then placed beyond the saphenofemoral junction and the needle was removed. The 65 cm sheath was then placed over the wire and the wire and dilator were removed. The laser fiber was then placed through the sheath and its tip was placed approximately 4-5 centimeters below the saphenofemoral junction. Tumescent anesthesia was then created with a dilute lidocaine solution. Laser energy was then delivered with constant withdrawal of the sheath and laser fiber. Approximately 1294 joules of energy were delivered over a length of 36 centimeters using a 1470 Hz VenaCure machine at 7 W. Sterile dressings were placed. The patient tolerated the procedure well without obvious complications.   Follow-up in 1 week with post-laser duplex.

## 2019-07-16 ENCOUNTER — Ambulatory Visit (INDEPENDENT_AMBULATORY_CARE_PROVIDER_SITE_OTHER): Payer: Medicare Other

## 2019-07-16 ENCOUNTER — Other Ambulatory Visit: Payer: Self-pay

## 2019-07-16 DIAGNOSIS — I83813 Varicose veins of bilateral lower extremities with pain: Secondary | ICD-10-CM | POA: Diagnosis not present

## 2019-07-17 ENCOUNTER — Ambulatory Visit: Payer: Medicare Other | Admitting: Physical Therapy

## 2019-07-19 ENCOUNTER — Ambulatory Visit: Payer: Medicare Other | Admitting: Physical Therapy

## 2019-07-27 ENCOUNTER — Encounter: Payer: Self-pay | Admitting: Family Medicine

## 2019-08-02 ENCOUNTER — Encounter: Payer: Self-pay | Admitting: Family Medicine

## 2019-08-02 ENCOUNTER — Ambulatory Visit (INDEPENDENT_AMBULATORY_CARE_PROVIDER_SITE_OTHER): Payer: Medicare Other | Admitting: Family Medicine

## 2019-08-02 ENCOUNTER — Other Ambulatory Visit: Payer: Self-pay

## 2019-08-02 DIAGNOSIS — R413 Other amnesia: Secondary | ICD-10-CM | POA: Diagnosis not present

## 2019-08-02 DIAGNOSIS — F325 Major depressive disorder, single episode, in full remission: Secondary | ICD-10-CM

## 2019-08-02 DIAGNOSIS — I69359 Hemiplegia and hemiparesis following cerebral infarction affecting unspecified side: Secondary | ICD-10-CM | POA: Diagnosis not present

## 2019-08-02 DIAGNOSIS — Z818 Family history of other mental and behavioral disorders: Secondary | ICD-10-CM

## 2019-08-02 DIAGNOSIS — Z820 Family history of epilepsy and other diseases of the nervous system: Secondary | ICD-10-CM | POA: Diagnosis not present

## 2019-08-02 MED ORDER — SERTRALINE HCL 50 MG PO TABS: 50.0000 mg | ORAL_TABLET | Freq: Every day | ORAL | 1 refills | Status: DC

## 2019-08-02 NOTE — Progress Notes (Signed)
Name: Carolyn Campbell   MRN: [id number]    DOB: 04/16/1956   Date:08/02/2019       Progress Note  Subjective  Chief Complaint  Chief Complaint  Patient presents with  . Memory Loss    forgetting names, dates    I connected with  MERRIE EPLER on 08/02/19 at  2:00 PM EST by telephone and verified that I am speaking with the correct person using two identifiers.   I discussed the limitations, risks, security and privacy concerns of performing an evaluation and management service by telephone and the availability of in person appointments. Staff also discussed with the patient that there may be a patient responsible charge related to this service. Patient Location: Home Provider Location: Office Additional Individuals present: None  HPI  Pt presents with concern for progressively worsening memory issues.  She is having more trouble recalling names of familiar people. She also forgets to do basic things around the house like leaving drawers and cabinets open.  She notes that her Dad had Alzheimer's Dementia and is concerned that she may be developing similar symptoms.   S/p CVA with left side weakness:States left side weakness is sameand not worse.She endorses ongoing intermittent difficulty saying/choosing the correct words, but this had been stable for years up until the last several months. She always has some left foot drop, has had some close calls,but no falls lately.On statin & BP meds; lipids and BP well controlled.  Depression: States she has just not felt like herself lately, and has been on lower dose of zoloft at 49m.  We will increase this back to 524mto see if this helps her dysthymia.   Most recent vitamin D, TSH, CBC, and CMP are non-contributory to her current symptoms.  UTA Mini-cog due to telephone visit - unable to do clock drawing.  6CIT Screen 08/02/2019 11/02/2018 03/28/2018  What Year? 0 points 0 points 0 points  What month? 0 points 0 points 0 points   What time? 0 points 0 points 0 points  Count back from 20 0 points 0 points 0 points  Months in reverse 0 points 0 points 0 points  Repeat phrase 0 points 2 points 0 points  Total Score 0 2 0   Patient Active Problem List   Diagnosis Date Noted  . Varicose veins of both lower extremities with pain 07/19/2018  . Arthritis of right hand 12/21/2017  . Immunosuppressed status ([place]04/11/2017  . Major depression in remission ([place]04/11/2017  . Fibrocystic breast changes 06/22/2017  . Osteopenia 10/18/2016  . Atherosclerosis of aorta ([facility] [place]03/31/2017  . Vitreous degeneration 05/21/2015  . Bad memory 05/21/2015  . Glaucoma associated with chamber angle anomalies 05/21/2015  . Dermatitis, eczematoid 05/21/2015  . CD (Crohn's disease) ([place]08/31/2016  . H/O malignant neoplasm of breast 05/21/2015  . Crohn's disease of both small and large intestine with rectal bleeding ([facility]03/16/2016  . Solitary pulmonary nodule 11/07/2012  . 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 Cognitive deficits as late effect of cerebrovascular disease 01/20/2010  . CVA, old, hemiparesis (HC[place]08/05/2009  . Adult hypothyroidism 06/12/2008  . Acid reflux 05/23/2007  . Hypercholesterolemia without hypertriglyceridemia 01/18/2007  . Benign essential HTN 01/18/2007    Social History   Tobacco Use  . Smoking status: Never Smoker  . Smokeless tobacco: Never Used  Substance Use Topics  . Alcohol use: No    Alcohol/week: 0.0 standard drinks    Current Outpatient Medications:  .  alendronate (FOSAMAX) 70 MG tablet, TAKE 1 TABLET BY MOUTH ONCE A WEEK. TAKE WITH A FULL  GLASS OF WATER ON AN EMPTY  STOMACH., Disp: 12 tablet, Rfl: 3 .  atorvastatin (LIPITOR) 80 MG tablet, Take 1 tablet (80 mg total) by mouth daily., Disp: 90 tablet, Rfl: 3 .  diclofenac sodium (VOLTAREN) 1 % GEL, Apply 2 g topically 4 (four) times daily., Disp: 100 g, Rfl: 0 .  gabapentin (NEURONTIN) 300 MG capsule, Take  1 capsule (300 mg total) by mouth at bedtime., Disp: 90 capsule, Rfl: 0 .  hydrochlorothiazide (HYDRODIURIL) 12.5 MG tablet, Take 1 tablet (12.5 mg total) by mouth daily., Disp: 90 tablet, Rfl: 1 .  latanoprost (XALATAN) 0.005 % ophthalmic solution, Place 1 drop into both eyes at bedtime. , Disp: , Rfl:  .  levothyroxine (SYNTHROID) 50 MCG tablet, Take 1 tablet (50 mcg total) by mouth daily., Disp: 90 tablet, Rfl: 1 .  Multiple Vitamin (MULTIVITAMIN) tablet, Take 1 tablet by mouth daily., Disp: , Rfl:  .  sertraline (ZOLOFT) 50 MG tablet, Take 0.5 tablets (25 mg total) by mouth daily., Disp: 90 tablet, Rfl: 1 .  triamcinolone (KENALOG) 0.025 % ointment, Apply 1 application topically 2 (two) times daily., Disp: 30 g, Rfl: 0 .  valACYclovir (VALTREX) 1000 MG tablet, Take 1 tablet (1,000 mg total) by mouth 2 (two) times daily., Disp: 30 tablet, Rfl: 5 .  vedolizumab (ENTYVIO) 300 MG injection, Inject into the vein., Disp: , Rfl:  .  omeprazole (PRILOSEC) 40 MG capsule, Take 1 capsule (40 mg total) by mouth daily before breakfast., Disp: 90 capsule, Rfl: 0  No Known Allergies  I personally reviewed active problem list, medication list, allergies, notes from last encounter, lab results with the patient/caregiver today.  ROS  Constitutional: Negative for fever or weight change.  Respiratory: Negative for cough and shortness of breath.   Cardiovascular: Negative for chest pain or palpitations.  Gastrointestinal: Negative for abdominal pain, no bowel changes.  Musculoskeletal: Negative for gait problem or joint swelling.  Skin: Negative for rash.  Neurological: Negative for dizziness or headaches.  No other specific complaints in a complete review of systems (except as listed in HPI above).  Objective  Virtual encounter, vitals not obtained.  There is no height or weight on file to calculate BMI.  Nursing Note and Vital Signs reviewed.  Physical Exam Pulmonary/Chest: Effort normal. No  respiratory distress. Speaking in complete sentences Neurological: Pt is alert and oriented to person, place, and time. Speech is normal. Psychiatric: Patient has a normal mood and affect. behavior is normal. Judgment and thought content normal.  No results found for this or any previous visit (from the past 72 hour(s)).  Assessment & Plan  1. CVA, old, hemiparesis ([place]) - Question if this is an exacerbation of prior CVA symptoms vs new symptoms of memory loss. - sertraline (ZOLOFT) 50 MG tablet; Take 1 tablet (50 mg total) by mouth daily.  Dispense: 90 tablet; Refill: 1 - Ambulatory referral to Neurology  2. Paternal family history of dementia - sertraline (ZOLOFT) 50 MG tablet; Take 1 tablet (50 mg total) by mouth daily.  Dispense: 90 tablet; Refill: 1 - Ambulatory referral to Neurology  3. Major depression in remission ([place]) - We will increase her zoloft back to 88m as she is feeling down and having memory issues; advised memory issues that are mild can often be secondary to poorly controlled depression.   - sertraline (ZOLOFT) 50 MG tablet; Take 1 tablet (50  mg total) by mouth daily.  Dispense: 90 tablet; Refill: 1  4. Memory difficulty - Ambulatory referral to Neurology  - I discussed the assessment and treatment plan with the patient. The patient was provided an opportunity to ask questions and all were answered. The patient agreed with the plan and demonstrated an understanding of the instructions.  - The patient was advised to call back or seek an in-person evaluation if the symptoms worsen or if the condition fails to improve as anticipated.  I provided 18 minutes of non-face-to-face time during this encounter.  Hubbard Hartshorn, FNP

## 2019-08-04 ENCOUNTER — Other Ambulatory Visit: Payer: Self-pay | Admitting: Gastroenterology

## 2019-08-06 MED ORDER — OMEPRAZOLE 40 MG PO CPDR: 40.0000 mg | DELAYED_RELEASE_CAPSULE | Freq: Every day | ORAL | 0 refills | Status: DC

## 2019-08-06 NOTE — Telephone Encounter (Signed)
Last office visit 05/29/2019 Swelling of left lower extremity chron's disease  Last refill 04/10/2019 0 refills  Has appointment 10/08/2019

## 2019-08-10 ENCOUNTER — Ambulatory Visit
Admission: RE | Admit: 2019-08-10 | Discharge: 2019-08-10 | Disposition: A | Discharge status: Home / Self Care | Payer: Medicare Other | Source: Ambulatory Visit | Attending: Family Medicine | Admitting: Family Medicine

## 2019-08-10 DIAGNOSIS — Z1231 Encounter for screening mammogram for malignant neoplasm of breast: Secondary | ICD-10-CM | POA: Diagnosis not present

## 2019-08-10 DIAGNOSIS — Z853 Personal history of malignant neoplasm of breast: Secondary | ICD-10-CM | POA: Insufficient documentation

## 2019-08-28 ENCOUNTER — Ambulatory Visit: Payer: Medicare Other | Admitting: Gastroenterology

## 2019-09-11 ENCOUNTER — Other Ambulatory Visit: Payer: Self-pay

## 2019-09-11 ENCOUNTER — Ambulatory Visit (INDEPENDENT_AMBULATORY_CARE_PROVIDER_SITE_OTHER): Payer: Medicare Other | Admitting: Family Medicine

## 2019-09-11 ENCOUNTER — Encounter: Payer: Self-pay | Admitting: Family Medicine

## 2019-09-11 VITALS — BP 122/68 | HR 71 | Temp 98.2°F | Resp 14 | Ht 60.0 in | Wt 142.1 lb

## 2019-09-11 DIAGNOSIS — Z820 Family history of epilepsy and other diseases of the nervous system: Secondary | ICD-10-CM

## 2019-09-11 DIAGNOSIS — F325 Major depressive disorder, single episode, in full remission: Secondary | ICD-10-CM | POA: Diagnosis not present

## 2019-09-11 DIAGNOSIS — I69359 Hemiplegia and hemiparesis following cerebral infarction affecting unspecified side: Secondary | ICD-10-CM | POA: Diagnosis not present

## 2019-09-11 DIAGNOSIS — M67432 Ganglion, left wrist: Secondary | ICD-10-CM

## 2019-09-11 DIAGNOSIS — Z818 Family history of other mental and behavioral disorders: Secondary | ICD-10-CM

## 2019-09-11 MED ORDER — SERTRALINE HCL 50 MG PO TABS: 50.0000 mg | ORAL_TABLET | Freq: Every day | ORAL | 1 refills | Status: DC

## 2019-09-11 NOTE — Patient Instructions (Signed)
[facility] on [address] in Clinic Mon-Fri 1p-7p

## 2019-09-11 NOTE — Progress Notes (Signed)
Name: Carolyn Campbell   MRN: [id number]    DOB: 1956-09-03   Date:09/11/2019       Progress Note  Subjective  Chief Complaint  Chief Complaint  Patient presents with  . Cyst    on left wrist for 2 days, painful when moving a certain way    HPI  PT presents with concern for knot on wrist for about 3 days now.  She has never had a lesion like this in the past.  She notes the knot is on the LEFT anterior lateral wrist.  Is slightly tender to deep palpation, no bleeding/exudate, erythema, no joint swelling, no numbness.  She has baseline weakness s/p prior stroke to the LEFT hand - reports no changes in grip strength.  She does endorse some tingling that goes to her hand and up her arm when she moves the wrist in a certain way.   Patient Active Problem List   Diagnosis Date Noted  . Varicose veins of both lower extremities with pain 07/19/2018  . Arthritis of right hand 12/21/2017  . Immunosuppressed status ([place]) 12/21/2017  . Major depression in remission ([place]) 12/21/2017  . Fibrocystic breast changes 06/22/2017  . Osteopenia 10/18/2016  . Atherosclerosis of aorta ([place]) 12/19/2015  . Vitreous degeneration 05/21/2015  . Bad memory 05/21/2015  . Glaucoma associated with chamber angle anomalies 05/21/2015  . Dermatitis, eczematoid 05/21/2015  . CD (Crohn's disease) ([place]) 05/21/2015  . H/O malignant neoplasm of breast 05/21/2015  . Crohn's disease of both small and large intestine with rectal bleeding ([place]) 12/04/2014  . Solitary pulmonary nodule 11/07/2012  . 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 Cognitive deficits as late effect of cerebrovascular disease 01/20/2010  . CVA, old, hemiparesis ([place]) 04/28/2009  . Adult hypothyroidism 06/12/2008  . Acid reflux 05/23/2007  . Hypercholesterolemia without hypertriglyceridemia 01/18/2007  . Benign essential HTN 01/18/2007    Social History   Tobacco Use  . Smoking status: Never Smoker  . Smokeless  tobacco: Never Used  Substance Use Topics  . Alcohol use: No    Alcohol/week: 0.0 standard drinks     Current Outpatient Medications:  .  alendronate (FOSAMAX) 70 MG tablet, TAKE 1 TABLET BY MOUTH ONCE A WEEK. TAKE WITH A FULL  GLASS OF WATER ON AN EMPTY  STOMACH., Disp: 12 tablet, Rfl: 3 .  atorvastatin (LIPITOR) 80 MG tablet, Take 1 tablet (80 mg total) by mouth daily., Disp: 90 tablet, Rfl: 3 .  diclofenac sodium (VOLTAREN) 1 % GEL, Apply 2 g topically 4 (four) times daily., Disp: 100 g, Rfl: 0 .  gabapentin (NEURONTIN) 300 MG capsule, Take 1 capsule (300 mg total) by mouth at bedtime., Disp: 90 capsule, Rfl: 0 .  hydrochlorothiazide (HYDRODIURIL) 12.5 MG tablet, Take 1 tablet (12.5 mg total) by mouth daily., Disp: 90 tablet, Rfl: 1 .  latanoprost (XALATAN) 0.005 % ophthalmic solution, Place 1 drop into both eyes at bedtime. , Disp: , Rfl:  .  levothyroxine (SYNTHROID) 50 MCG tablet, Take 1 tablet (50 mcg total) by mouth daily., Disp: 90 tablet, Rfl: 1 .  Multiple Vitamin (MULTIVITAMIN) tablet, Take 1 tablet by mouth daily., Disp: , Rfl:  .  omeprazole (PRILOSEC) 40 MG capsule, Take 1 capsule (40 mg total) by mouth daily before breakfast., Disp: 90 capsule, Rfl: 0 .  sertraline (ZOLOFT) 50 MG tablet, Take 1 tablet (50 mg total) by mouth daily., Disp: 90 tablet, Rfl: 1 .  triamcinolone (KENALOG) 0.025 % ointment, Apply 1  application topically 2 (two) times daily., Disp: 30 g, Rfl: 0 .  valACYclovir (VALTREX) 1000 MG tablet, Take 1 tablet (1,000 mg total) by mouth 2 (two) times daily., Disp: 30 tablet, Rfl: 5 .  vedolizumab (ENTYVIO) 300 MG injection, Inject into the vein., Disp: , Rfl:   No Known Allergies  I personally reviewed active problem list, medication list, allergies, notes from last encounter, lab results with the patient/caregiver today.  ROS  Ten systems reviewed and is negative except as mentioned in HPI   Objective  Vitals:   09/11/19 0736  BP: 122/68  Pulse: 71   Resp: 14  Temp: 98.2 F (36.8 C)  TempSrc: Temporal  SpO2: 99%  Weight: 142 lb 1.6 oz (64.5 kg)  Height: 5' (1.524 m)    Body mass index is 27.75 kg/m.  Nursing Note and Vital Signs reviewed.  Physical Exam  Constitutional: Patient appears well-developed and well-nourished. No distress.  HENT: Head: Normocephalic and atraumatic.  Eyes: Conjunctivae and EOM are normal. No scleral icterus.  Neck: Normal range of motion. Neck supple. No JVD present.  Cardiovascular: Normal rate, regular rhythm and normal heart sounds.  No murmur heard. No BLE edema. Pulmonary/Chest: Effort normal and breath sounds normal. No respiratory distress. Musculoskeletal: Normal range of motion, no joint effusions. There is a firm, discrete, mildly tender cyst on the left anterior lateral wrist.  There is no erythema, no fluctuance. Neurological: Pt is alert and oriented to person, place, and time. No cranial nerve deficit. Coordination, balance, strength, speech and gait are normal.  Skin: Skin is warm and dry. No rash noted. No erythema.  Psychiatric: Patient has a normal mood and affect. behavior is normal. Judgment and thought content normal.   No results found for this or any previous visit (from the past 72 hour(s)).  Assessment & Plan  1. Ganglion cyst of wrist, left - Ambulatory referral to Orthopedics - May apply voltaren Gel - avoid PO NSAID at this time due to Crohn Disease.  - Refill of Zoloft per patient request as well for her depression.

## 2019-10-08 ENCOUNTER — Ambulatory Visit (INDEPENDENT_AMBULATORY_CARE_PROVIDER_SITE_OTHER): Payer: Medicare Other | Admitting: Gastroenterology

## 2019-10-08 ENCOUNTER — Other Ambulatory Visit: Payer: Self-pay

## 2019-10-08 DIAGNOSIS — K5010 Crohn's disease of large intestine without complications: Secondary | ICD-10-CM | POA: Diagnosis not present

## 2019-10-08 NOTE — Progress Notes (Deleted)
Carolyn Darby, MD [address]  [place]  [place], [place] [address]  Main: [phone]  Fax: [phone]    Gastroenterology Consultation  Referring Provider:     Hubbard Hartshorn, FNP Primary Care Physician:  Hubbard Hartshorn, FNP Primary Gastroenterologist:  Dr. Cephas Campbell Reason for Consultation: Crohn's Disease        HPI:   Carolyn Campbell is a 64 y.o. African-American female referred by Dr. Uvaldo Rising, Astrid Divine, FNP  for consultation & management of Crohn's disease. This was diagnosed in 2015, currently on Imuran 50 mg daily. She reports intermittent flareups of her Crohn's disease such as abdominal pain, bloating, rectal bleeding. She received prednisone for flare up. She had 2 flareups within last 1 year. She recently is suffering with constipation, had to take laxatives to have a BM. Her last BM was 3 days ago. She does report some upper abdominal bloating. She denies any weight loss, rectal bleeding, diarrhea, nausea, vomiting. She is not on any prednisone currently. She denies taking NSAIDs.  Follow up visit 10/24/18 She thinks she has flare up of crohn's as she has mild abdominal discomfort that limited her PO intake. She continues to have atypical chest discomfort for which she is taking tums. She mentioned about chest pain during last visit, I urged her to see her PCP which she did not yet  Follow-up visit 11/06/2018 Since last visit, her symptoms worsened.  Therefore, patient made an urgent visit to see me today.  She reports upper abdominal pain limiting her p.o. intake, results in nausea, describes it as burning.  She is taking Tums and Prilosec as needed which provides some relief. She denies diarrhea.  She lost about 6 pounds in last 3 weeks.  She denies fever, chills.  She is accompanied by her husband today.  I was planning to start her on immunomodulator, she underwent screening for TB and hepatitis B, which were negative.  TPMT levels were normal.  Follow-up visit  11/20/2018 She reports feeling significantly better, reports mild pain in the epigastric region.  She started taking azathioprine 50 mg daily.  She tried prednisone 40 mg for 2 days then developed itching associated with rash in her hands, she felt itching might be secondary to prednisone so stopped taking it.  Rash currently resolved.  Eating well.  CT abdomen and pelvis revealed possible cystitis, UA showed leukocytes and she was given ceftriaxone at her PCPs office and given Bactrim twice daily for 5 days.  Urine cultures are negative  Follow-up visit 03/27/2019 Patient underwent EGD and colonoscopy.  EGD was unremarkable, colonoscopy revealed mild to moderate active colitis.  Patient continues to have mild symptoms of abdominal discomfort, blood mixed with stool and on wiping.  She denies diarrhea, weight loss.  In fact, she gained few pounds.  She feels tired, does not feel normal in her stomach. She is currently on azathioprine 50 mg daily.  Applied for [employer] as patient did not chose Humira as she does not prefer self injection.  She was previously on Remicade  Follow-up visit 05/29/2019 Patient received first dose of Entyvio on 05/23/2019 in [place].  She tolerated it well without any side effects.  She noticed improvement in her stool consistency as well as minimal rectal bleeding.  She has gained few pounds as well.  Today, she is concerned about 1 week history of left leg pain associated with some swelling and tenderness.  She denies fever, chills.  She continues to  take azathioprine 50 mg daily.  She finished prednisone course  Crohn's disease classification:  Age: > 40 Location: ileocolonic  Behavior: non stricturing, non penetrating  Perianal: no  IBD diagnosis: 11/2013  Disease course:Crohn's disease in 2015, initial symptoms were abdominal pain, blood in stool and on wiping, bloating. She had a colonoscopy, VCE, EGD at the time of diagnosis. She was initially on mesalamine 4 pills  daily, prednisone later switched to Remicade in 11/2014. She took only 5 doses as she could not afford. Then she was switched to Imuran 50 mg daily. She had mild flareups about twice a year. Colonoscopy 05/2017 revealed mild ileocolonic disease primarily on histology. She self discontinued Imuran. Lialda started in 06/2017.  Possible exacerbation of Crohn's disease with elevated fecal calprotectin in 10/2018.  CT abdomen and pelvis unremarkable.  Discontinued Lialda, started on azathioprine 50 mg daily.  Flareup of Crohn's in 10/2018, confirmed on colonoscopy and elevated fecal calprotectin levels.  Short prednisone course and started Entyvio on 05/23/2019  Extra intestinal manifestations: None  IBD surgical history: None No family history of IBD Imaging:  MRE none CTE none SBFT none  Procedures:  Colonoscopy : 01/28/2005, showed external hemorrhoids only Colonoscopy 12/10/2013, showed normal terminal ileum, biopsies performed. Skipped areas of nonbleeding ulcerative mucosa were seen in the entire colon, biopsies performed. 4 mm polyp in the sigmoid colon Pathology: Rectal biopsy mild-to-moderate active proctitis with architectural features of chronicity, negative for dysplasia and malignancy. Terminal ileum: Small bowel mucosa with preserved villous architecture. Right colon biopsy: No significant pathologic changes  Upper Endoscopy 12/10/2013, underwent dilation of the esophagus Ampullary biopsy: Normal, gastric biopsy: Intramucosal with erosion and mild chronic gastritis, negative for H. pylori, dysplasia and malignancy  VCE 05/06/2014: Erosions in the distal ileum  EGD and colonoscopy 03/08/2019  DIAGNOSIS:  A. DUODENUM POLYP; COLD BIOPSY:  - DUODENAL MUCOSA WITH PROMINENT BRUNNER'S GLAND AND REACTIVE CHANGES IN  OVERLYING VILLI.  - [place] ENTERITIS, DYSPLASIA, AND MALIGNANCY.   B. STOMACH, RANDOM; COLD BIOPSY:  - ANTRAL MUCOSA WITH CHANGES  CONSISTENT WITH ENDOSCOPIC FINDING OF  ANTRAL EROSION.  - FRAGMENTS OF UNREMARKABLE OXYNTIC MUCOSA.  - NEGATIVE FOR H. PYLORI, DYSPLASIA, AND MALIGNANCY.   C. TERMINAL ILEUM; COLD BIOPSY:  - UNREMARKABLE SMALL INTESTINAL MUCOSA.  - NEGATIVE FOR ACTIVE ENTERITIS, DYSPLASIA, AND MALIGNANCY.   D. COLON, RANDOM RIGHT; COLD BIOPSY:  - MODERATE CHRONIC ACTIVE COLITIS CONSISTENT WITH PATIENT'S KNOWN  HISTORY OF CROHN'S.  - NEGATIVE FOR VIRAL CYTOPATHIC EFFECT, DYSPLASIA, AND MALIGNANCY.   E. COLON, RANDOM TRANSVERSE; COLD BIOPSY:  - MILD CHRONIC ACTIVE COLITIS INVOLVING A MINORITY OF THE BIOPSY  FRAGMENTS.  - CHRONIC INACTIVE COLITIS INVOLVING THE MAJORITY OF THE BIOPSY  FRAGMENTS.  - NEGATIVE FOR VIRAL CYTOPATHIC EFFECT, DYSPLASIA, AND MALIGNANCY.   F. COLON, RANDOM LEFT; COLD BIOPSY:  - PATCHY MILD CHRONIC INACTIVE COLITIS.  - NEGATIVE FOR DYSPLASIA AND MALIGNANCY.   G. RECTUM, RANDOM PROCTITIS; COLD BIOPSY:  - MARKED CHRONIC ACTIVE PROCTITIS CONSISTENT WITH PATIENT'S KNOWN  HISTORY OF CROHN'S.  - NEGATIVE FOR VIRAL CYTOPATHIC EFFECT, DYSPLASIA, AND MALIGNANCY.  EGD 06/08/2017 Normal esophagus, small hiatal hernia, normal stomach and duodenum  Colonoscopy 06/08/2017 The perianal and digital rectal examinations were normal. Pertinent negatives include normal sphincter tone and no palpable rectal Lesions. The terminal ileum appeared normal. Biopsies were taken with a cold forceps for histology. Two scattered non-bleeding aphthae were found in the ascending colon. Rest of the colon and rectum appeared  normal. Random biopsies performed   IBD medications:  Steroids: Prednisone steroid responsive, budesonide 5-ASA: mesalamine, Lialda in the past Immunomodulators: azathioprine  TPMT status normal Biologics:  Anti TNFs: Remicade monotherapy, received induction followed by 1 maintenance dose in 11/2014 Anti Integrins: Entyvio started on 05/23/2019 Ustekinumab: Tofactinib: Clinical  trial:   Past Medical History:  Diagnosis Date  . Abdominal pain, epigastric   . Allergic rhinitis   . Anginal pain ([place])    states MD said it was GERD  . Arthritis   . Asthma   . Breast cancer ([place]) 2006   LT LUMPECTOMY  . CD (Crohn's disease) ([place]) 05/21/2015   FOLLOWED BY GI   . Clotting disorder ([place])   . Cognitive deficits as late effect of cerebrovascular disease   . COPD (chronic obstructive pulmonary disease) ([place])   . Crohn's disease of both small and large intestine with rectal bleeding ([place]) 12/04/2014  . Depression    Currently taking zoloft.  . 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 Dyspnea   . Esophageal reflux   . Fever blister 07/19/2018  . Glaucoma    vitreous degeneration  . History of kidney stones   . Hyperlipidemia   . Hypertension   . Hypothyroidism   . Occlusion, cerebral artery    NOS w/infarction  . Osteoporosis   . Personal history of radiation therapy 2006   BREAST CA  . Stroke ([place])   . Ulcer     Past Surgical History:  Procedure Laterality Date  . BREAST BIOPSY Left 2006   POS  . BREAST LUMPECTOMY Left 09/20/2004   positive  . BREAST SURGERY Left    malignant biopsy  . CARDIAC CATHETERIZATION    . COLONOSCOPY  2015  . COLONOSCOPY WITH PROPOFOL N/A 06/08/2017   Procedure: COLONOSCOPY WITH PROPOFOL;  Surgeon: Lin Landsman, MD;  Location: [facility];  Service: Gastroenterology;  Laterality: N/A;  . COLONOSCOPY WITH PROPOFOL N/A 03/08/2019   Procedure: COLONOSCOPY WITH PROPOFOL;  Surgeon: Lin Landsman, MD;  Location: [facility] ENDOSCOPY;  Service: Gastroenterology;  Laterality: N/A;  . ESOPHAGOGASTRODUODENOSCOPY  2015  . ESOPHAGOGASTRODUODENOSCOPY (EGD) WITH PROPOFOL N/A 06/08/2017   Procedure: ESOPHAGOGASTRODUODENOSCOPY (EGD) WITH PROPOFOL;  Surgeon: Lin Landsman, MD;  Location: [facility];  Service: Gastroenterology;  Laterality: N/A;  . ESOPHAGOGASTRODUODENOSCOPY (EGD) WITH PROPOFOL N/A  03/08/2019   Procedure: ESOPHAGOGASTRODUODENOSCOPY (EGD) WITH PROPOFOL;  Surgeon: Lin Landsman, MD;  Location: [facility] ENDOSCOPY;  Service: Gastroenterology;  Laterality: N/A;  . FINGER SURGERY     Right small finger  . FRACTURE SURGERY    . [place] STUDY  2015  . TUBAL LIGATION       Current Outpatient Medications:  .  alendronate (FOSAMAX) 70 MG tablet, TAKE 1 TABLET BY MOUTH ONCE A WEEK. TAKE WITH A FULL  GLASS OF WATER ON AN EMPTY  STOMACH., Disp: 12 tablet, Rfl: 3 .  atorvastatin (LIPITOR) 80 MG tablet, Take 1 tablet (80 mg total) by mouth daily., Disp: 90 tablet, Rfl: 3 .  diclofenac sodium (VOLTAREN) 1 % GEL, Apply 2 g topically 4 (four) times daily., Disp: 100 g, Rfl: 0 .  gabapentin (NEURONTIN) 300 MG capsule, Take 1 capsule (300 mg total) by mouth at bedtime., Disp: 90 capsule, Rfl: 0 .  hydrochlorothiazide (HYDRODIURIL) 12.5 MG tablet, Take 1 tablet (12.5 mg total) by mouth daily., Disp: 90 tablet, Rfl: 1 .  latanoprost (XALATAN) 0.005 % ophthalmic solution, Place 1 drop into both eyes at  bedtime. , Disp: , Rfl:  .  levothyroxine (SYNTHROID) 50 MCG tablet, Take 1 tablet (50 mcg total) by mouth daily., Disp: 90 tablet, Rfl: 1 .  Multiple Vitamin (MULTIVITAMIN) tablet, Take 1 tablet by mouth daily., Disp: , Rfl:  .  omeprazole (PRILOSEC) 40 MG capsule, Take 1 capsule (40 mg total) by mouth daily before breakfast., Disp: 90 capsule, Rfl: 0 .  sertraline (ZOLOFT) 50 MG tablet, Take 1 tablet (50 mg total) by mouth daily., Disp: 90 tablet, Rfl: 1 .  triamcinolone (KENALOG) 0.025 % ointment, Apply 1 application topically 2 (two) times daily., Disp: 30 g, Rfl: 0 .  valACYclovir (VALTREX) 1000 MG tablet, Take 1 tablet (1,000 mg total) by mouth 2 (two) times daily., Disp: 30 tablet, Rfl: 5 .  vedolizumab (ENTYVIO) 300 MG injection, Inject into the vein., Disp: , Rfl:    Family History  Problem Relation Age of Onset  . Heart disease Mother   . Heart attack Mother   . Breast  cancer Sister   . Alcohol abuse Father   . Cerebrovascular Accident Father   . Arthritis Father   . Hypertension Father   . Hypertension Other   . Diabetes Other   . Breast cancer Sister 53  . Heart attack Other 41  . Cancer Sister      Social History   Tobacco Use  . Smoking status: Never Smoker  . Smokeless tobacco: Never Used  Substance Use Topics  . Alcohol use: No    Alcohol/week: 0.0 standard drinks  . Drug use: No    Allergies as of 10/08/2019  . (No Known Allergies)    Review of Systems:    All systems reviewed and negative except where noted in HPI.   Physical Exam:  There were no vitals taken for this visit. No LMP recorded. Patient is postmenopausal.  General:   Alert,  Well-developed, well-nourished, pleasant and cooperative in NAD Head:  Normocephalic and atraumatic. Eyes:  Sclera clear, no icterus.   Conjunctiva pink. Ears:  Normal auditory acuity. Nose:  No deformity, discharge, or lesions. Mouth:  No deformity or lesions,oropharynx pink & moist. Neck:  Supple; no masses or thyromegaly. Lungs:  Respirations even and unlabored.  Clear throughout to auscultation.   No wheezes, crackles, or rhonchi. No acute distress. Heart:  Regular rate and rhythm; no murmurs, clicks, rubs, or gallops. Abdomen:  Normal bowel sounds.  No bruits.  Soft, mild tenderness in abdomen, and non-distended without masses, hepatosplenomegaly or hernias noted.  No guarding or rebound tenderness.   Rectal: Nor performed Msk:  Symmetrical without gross deformities. Good, equal movement & strength bilaterally. Pulses:  Normal pulses noted. Extremities:  No clubbing or edema.  No cyanosis. Neurologic:  Alert and oriented x3;  grossly normal neurologically. Skin:  Intact without significant lesions or rashes. No jaundice. Psych:  Alert and cooperative. Normal mood and affect.  Imaging Studies: MRI reviewed  Assessment and Plan:   ARIZA EVANS is a 64 y.o. African-American  female with Terminal ileal and colonic Crohn's, diagnosed in 2015 who was previously maintained on Imuran 50 mg daily with mild intermittent flareups about twice a year needing prednisone. She does not have any endoscopy evidence of ileocolonic Crohn's disease other than a few scattered aphthae in the ascending colon on colonoscopy in 05/2017 but histology revealed chronic mild active ileitis and right-sided colitis. MR enterography did not inflammation in small bowel. I tried to give her budesonide but she could not afford high co-pay of $  500 for 30 days supply.  She stopped Imuran by herself in the past.  Recent flareup of her Crohn's disease with worsening of upper abdominal pain associated with nausea, unintentional weight loss, poor p.o. intake. Fecal calprotectin levels are elevated.  Discontinued Lialda, currently on azathioprine 50 mg daily.  She took prednisone for 2 days only due to new onset of rash and itching.  Rash has resolved.  EGD was unremarkable.  Repeat colonoscopy revealed active Crohn's colitis, worse in the rectum with mild symptoms.  Started on Entyvio on 05/23/2019, currently in clinical remission.  Crohn's disease of small intestine and large intestine -Continue azathioprine 50 mg daily for next 3 months, then stop -CBC, LFTs today -Continue Entyvio  Left lower extremity pain and swelling Recommend ultrasound Dopplers to rule out DVT given her history of IBD   IBD Health Maintenance  1.TB status: Gold quantiferon negative 10/2018 2. Anemia: normal Hb, vitamin B12 and ferritin levels are normal 3.Immunizations: Hep A immune, hepatitis B not immune, received 1 dose in 06/2017, hep B vaccine received on 04/11/2019, influenza vaccine received in 06/2018, prevnar received in 06/2017, pneumovax administered on 03/27/2019.  Received first dose of Shingrix vaccine on 04/11/2019 4.Cancer screening I) Colon cancer/dysplasia surveillance: Colonoscopy 03/08/2019, no evidence of dysplasia and no  polyps identified.  II) Cervical cancer: n/a III) Skin cancer - n/a  5.Bone health Vitamin D status: Normal Bone density testing: Not done 5. Labs: today 6. Smoking: Never smoked 7. NSAIDs and Antibiotics use: Denies NSAID use and antibiotic use  Follow up in 3 months   Carolyn Darby, MD

## 2019-10-08 NOTE — Progress Notes (Signed)
[name], MD [address]  [place]  [place], [place] [address]  Main: [phone]  Fax: [phone]    Gastroenterology Consultation Video Visit  Referring Provider:     Hubbard Hartshorn, FNP Primary Care Physician:  Hubbard Hartshorn, FNP Primary Gastroenterologist:  Dr. [name] Reason for Consultation: Crohn's colitis        HPI:   Carolyn Campbell is a 64 y.o. female referred by Dr. Uvaldo Rising, Astrid Divine, FNP  for consultation & management of Crohn's colitis  Virtual Visit Video Note  I connected with Carolyn Campbell on 10/08/19 at  2:00 PM EST by video and verified that I am speaking with the correct person using two identifiers.   I discussed the limitations, risks, security and privacy concerns of performing an evaluation and management service by video and the availability of in person appointments. I also discussed with the patient that there may be a patient responsible charge related to this service. The patient expressed understanding and agreed to proceed.  Location of the Patient: Home  Location of the provider: Office  Persons participating in the visit: Patient and provider only   History of Present Illness: Carolyn Campbell has history of Crohn's disease of the terminal ileum and colon diagnosed in 2015, previously maintained on Imuran, Remicade and intermittent prednisone use.  Colonoscopy 02/2019 revealed recurrence of active Crohn's colitis, started on Entyvio monotherapy.  Patient has been in clinical remission since then.  Currently off azathioprine.  She reports having bowel movement every other day.  Reports feeling tired.  She denies abdominal pain, nausea or vomiting.  She had gained about 10 pounds since last visit.   NSAIDs: None  Antiplts/Anticoagulants/Anti thrombotics: None  GI Procedures: Refer to my previous note  Past Medical History:  Diagnosis Date  . Abdominal pain, epigastric   . Allergic rhinitis   . Anginal pain ([place])    states MD  said it was GERD  . Arthritis   . Asthma   . Breast cancer ([place]) 2006   LT LUMPECTOMY  . CD (Crohn's disease) ([place]) 05/21/2015   FOLLOWED BY GI   . Clotting disorder ([place])   . Cognitive deficits as late effect of cerebrovascular disease   . COPD (chronic obstructive pulmonary disease) ([place])   . Crohn's disease of both small and large intestine with rectal bleeding ([place]) 12/04/2014  . Depression    Currently taking zoloft.  . 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 Dyspnea   . Esophageal reflux   . Fever blister 07/19/2018  . Glaucoma    vitreous degeneration  . History of kidney stones   . Hyperlipidemia   . Hypertension   . Hypothyroidism   . Occlusion, cerebral artery    NOS w/infarction  . Osteoporosis   . Personal history of radiation therapy 2006   BREAST CA  . Stroke ([place])   . Ulcer     Past Surgical History:  Procedure Laterality Date  . BREAST BIOPSY Left 2006   POS  . BREAST LUMPECTOMY Left 09/20/2004   positive  . BREAST SURGERY Left    malignant biopsy  . CARDIAC CATHETERIZATION    . COLONOSCOPY  2015  . COLONOSCOPY WITH PROPOFOL N/A 06/08/2017   Procedure: COLONOSCOPY WITH PROPOFOL;  Surgeon: Lin Landsman, MD;  Location: [facility];  Service: Gastroenterology;  Laterality: N/A;  . COLONOSCOPY WITH PROPOFOL N/A 03/08/2019   Procedure: COLONOSCOPY WITH PROPOFOL;  Surgeon:  Lin Landsman, MD;  Location: [facility] ENDOSCOPY;  Service: Gastroenterology;  Laterality: N/A;  . ESOPHAGOGASTRODUODENOSCOPY  2015  . ESOPHAGOGASTRODUODENOSCOPY (EGD) WITH PROPOFOL N/A 06/08/2017   Procedure: ESOPHAGOGASTRODUODENOSCOPY (EGD) WITH PROPOFOL;  Surgeon: Lin Landsman, MD;  Location: [facility];  Service: Gastroenterology;  Laterality: N/A;  . ESOPHAGOGASTRODUODENOSCOPY (EGD) WITH PROPOFOL N/A 03/08/2019   Procedure: ESOPHAGOGASTRODUODENOSCOPY (EGD) WITH PROPOFOL;  Surgeon: Lin Landsman, MD;  Location: [facility] ENDOSCOPY;  Service:  Gastroenterology;  Laterality: N/A;  . FINGER SURGERY     Right small finger  . FRACTURE SURGERY    . [place] STUDY  2015  . TUBAL LIGATION      Current Outpatient Medications:  .  alendronate (FOSAMAX) 70 MG tablet, TAKE 1 TABLET BY MOUTH ONCE A WEEK. TAKE WITH A FULL  GLASS OF WATER ON AN EMPTY  STOMACH., Disp: 12 tablet, Rfl: 3 .  atorvastatin (LIPITOR) 80 MG tablet, Take 1 tablet (80 mg total) by mouth daily., Disp: 90 tablet, Rfl: 3 .  diclofenac sodium (VOLTAREN) 1 % GEL, Apply 2 g topically 4 (four) times daily., Disp: 100 g, Rfl: 0 .  gabapentin (NEURONTIN) 300 MG capsule, Take 1 capsule (300 mg total) by mouth at bedtime., Disp: 90 capsule, Rfl: 0 .  hydrochlorothiazide (HYDRODIURIL) 12.5 MG tablet, Take 1 tablet (12.5 mg total) by mouth daily., Disp: 90 tablet, Rfl: 1 .  latanoprost (XALATAN) 0.005 % ophthalmic solution, Place 1 drop into both eyes at bedtime. , Disp: , Rfl:  .  levothyroxine (SYNTHROID) 50 MCG tablet, Take 1 tablet (50 mcg total) by mouth daily., Disp: 90 tablet, Rfl: 1 .  Multiple Vitamin (MULTIVITAMIN) tablet, Take 1 tablet by mouth daily., Disp: , Rfl:  .  omeprazole (PRILOSEC) 40 MG capsule, Take 1 capsule (40 mg total) by mouth daily before breakfast., Disp: 90 capsule, Rfl: 0 .  sertraline (ZOLOFT) 50 MG tablet, Take 1 tablet (50 mg total) by mouth daily., Disp: 90 tablet, Rfl: 1 .  triamcinolone (KENALOG) 0.025 % ointment, Apply 1 application topically 2 (two) times daily., Disp: 30 g, Rfl: 0 .  valACYclovir (VALTREX) 1000 MG tablet, Take 1 tablet (1,000 mg total) by mouth 2 (two) times daily., Disp: 30 tablet, Rfl: 5 .  vedolizumab (ENTYVIO) 300 MG injection, Inject into the vein., Disp: , Rfl:     Family History  Problem Relation Age of Onset  . Heart disease Mother   . Heart attack Mother   . Breast cancer Sister   . Alcohol abuse Father   . Cerebrovascular Accident Father   . Arthritis Father   . Hypertension Father   . Hypertension  Other   . Diabetes Other   . Breast cancer Sister 3  . Heart attack Other 41  . Cancer Sister      Social History   Tobacco Use  . Smoking status: Never Smoker  . Smokeless tobacco: Never Used  Substance Use Topics  . Alcohol use: No    Alcohol/week: 0.0 standard drinks  . Drug use: No    Allergies as of 10/08/2019  . (No Known Allergies)     Imaging Studies: Reviewed  Assessment and Plan:   Carolyn Campbell is a 64 y.o. female with African-American female with Terminal ileal and colonic Crohn's, diagnosed in 2015 who was previously maintained on Imuran 50 mg daily with mild intermittent flareups about twice a year needing prednisone. She does not have any endoscopy evidence of ileocolonic Crohn's disease other than a few  scattered aphthae in the ascending colon on colonoscopy in 05/2017 but histology revealed chronic mild active ileitis and right-sided colitis. MR enterography did not inflammation in small bowel. I tried to give her budesonide but she could not afford high co-pay of $500 for 30 days supply.  She stopped Imuran by herself in the past.  Recent flareup of her Crohn's disease with worsening of upper abdominal pain associated with nausea, unintentional weight loss, poor p.o. intake. Fecal calprotectin levels are elevated.  Discontinued Lialda, currently on azathioprine 50 mg daily.  She took prednisone for 2 days only due to new onset of rash and itching.  Rash has resolved.  EGD was unremarkable.  Repeat colonoscopy revealed active Crohn's colitis, worse in the rectum with mild symptoms.  Started on Entyvio on 05/23/2019, currently in clinical remission.  Crohn's colitis: In clinical remission Continue Entyvio monotherapy every 8 weeks Recheck CBC, LFTs, iron studies, B12 folate panel Please check fecal calprotectin levels, CRP, QuantiFERON gold Also, discussed with her about colonoscopy to assess response to [facility] in next 3 months   Follow Up Instructions:   I  discussed the assessment and treatment plan with the patient. The patient was provided an opportunity to ask questions and all were answered. The patient agreed with the plan and demonstrated an understanding of the instructions.   The patient was advised to call back or seek an in-person evaluation if the symptoms worsen or if the condition fails to improve as anticipated.  I provided 15 minutes of face-to-face time during this encounter.   Follow up in 3 months   [name], MD

## 2019-10-09 ENCOUNTER — Other Ambulatory Visit: Payer: Self-pay | Admitting: Neurology

## 2019-10-09 ENCOUNTER — Other Ambulatory Visit (HOSPITAL_COMMUNITY): Payer: Self-pay | Admitting: Neurology

## 2019-10-09 DIAGNOSIS — R4189 Other symptoms and signs involving cognitive functions and awareness: Secondary | ICD-10-CM

## 2019-10-12 ENCOUNTER — Ambulatory Visit: Payer: Medicare Other | Admitting: Family Medicine

## 2019-10-19 ENCOUNTER — Other Ambulatory Visit: Payer: Self-pay

## 2019-10-19 ENCOUNTER — Ambulatory Visit (HOSPITAL_COMMUNITY)
Admission: RE | Admit: 2019-10-19 | Discharge: 2019-10-19 | Disposition: A | Discharge status: Home / Self Care | Payer: Medicare Other | Source: Ambulatory Visit | Attending: Neurology | Admitting: Neurology

## 2019-10-19 DIAGNOSIS — R4189 Other symptoms and signs involving cognitive functions and awareness: Secondary | ICD-10-CM | POA: Diagnosis present

## 2019-10-20 LAB — QUANTIFERON-TB GOLD PLUS
QuantiFERON Mitogen Value: >10 IU/mL
QuantiFERON Nil Value: 0.04 IU/mL
QuantiFERON TB1 Ag Value: 0.03 IU/mL
QuantiFERON TB2 Ag Value: 0.04 IU/mL
QuantiFERON-TB Gold Plus: NEGATIVE

## 2019-10-20 LAB — HEPATIC FUNCTION PANEL
ALT: 25 IU/L (ref 0–32)
AST: 23 IU/L (ref 0–40)
Albumin: 4.6 g/dL (ref 3.8–4.8)
Alkaline Phosphatase: 102 IU/L (ref 39–117)
Bilirubin Total: 0.6 mg/dL (ref 0.0–1.2)
Bilirubin, Direct: 0.15 mg/dL (ref 0.00–0.40)
Total Protein: 7.2 g/dL (ref 6.0–8.5)

## 2019-10-20 LAB — FERRITIN: Ferritin: 76 ng/mL (ref 15–150)

## 2019-10-20 LAB — CBC
Hematocrit: 38.6 % (ref 34.0–46.6)
Hemoglobin: 12.8 g/dL (ref 11.1–15.9)
MCH: 29.5 pg (ref 26.6–33.0)
MCHC: 33.2 g/dL (ref 31.5–35.7)
MCV: 89 fL (ref 79–97)
Platelets: 210 10*3/uL (ref 150–450)
RBC: 4.34 x10E6/uL (ref 3.77–5.28)
RDW: 12.4 % (ref 11.7–15.4)
WBC: 5.4 10*3/uL (ref 3.4–10.8)

## 2019-10-20 LAB — C-REACTIVE PROTEIN: CRP: 5 mg/L (ref 0–10)

## 2019-10-20 LAB — B12 AND FOLATE PANEL
Folate: >20 ng/mL (ref >3.0)
Vitamin B-12: 1014 pg/mL (ref 232–1245)

## 2019-10-20 LAB — IRON AND TIBC
Iron Saturation: 26 % (ref 15–55)
Iron: 85 ug/dL (ref 27–139)
Total Iron Binding Capacity: 330 ug/dL (ref 250–450)
UIBC: 245 ug/dL (ref 118–369)

## 2019-10-30 LAB — CALPROTECTIN, FECAL: Calprotectin, Fecal: 118 ug/g (ref 0–120)

## 2019-11-01 ENCOUNTER — Encounter: Payer: Self-pay | Admitting: Family Medicine

## 2019-11-01 ENCOUNTER — Other Ambulatory Visit: Payer: Self-pay | Admitting: Family Medicine

## 2019-11-01 DIAGNOSIS — M8580 Other specified disorders of bone density and structure, unspecified site: Secondary | ICD-10-CM

## 2019-11-01 MED ORDER — ALENDRONATE SODIUM 70 MG PO TABS: ORAL_TABLET | ORAL | 3 refills | Status: DC

## 2019-11-03 ENCOUNTER — Ambulatory Visit: Payer: Medicare Other | Attending: Internal Medicine

## 2019-11-03 DIAGNOSIS — Z23 Encounter for immunization: Secondary | ICD-10-CM | POA: Insufficient documentation

## 2019-11-03 NOTE — Progress Notes (Signed)
   Covid-19 Vaccination Clinic  Name:  Carolyn Campbell    MRN: [id number] DOB: 1956-06-21  11/03/2019  [name] was observed post Covid-19 immunization for 15 minutes without incidence. She was provided with Vaccine Information Sheet and instruction to access the V-Safe system.   [name] was instructed to call 911 with any severe reactions post vaccine: Marland Kitchen Difficulty breathing  . Swelling of your face and throat  . A fast heartbeat  . A bad rash all over your body  . Dizziness and weakness    Immunizations Administered    Name Date Dose VIS Date Route   Pfizer COVID-19 Vaccine 11/03/2019  9:22 AM 0.3 mL 08/31/2019 Intramuscular   Manufacturer: [place]   Lot: [id number]   [place]: [id number]

## 2019-11-06 ENCOUNTER — Ambulatory Visit (INDEPENDENT_AMBULATORY_CARE_PROVIDER_SITE_OTHER): Payer: Medicare Other

## 2019-11-06 VITALS — Temp 98.0°F | Ht 60.0 in | Wt 143.0 lb

## 2019-11-06 DIAGNOSIS — Z0000 Encounter for general adult medical examination without abnormal findings: Secondary | ICD-10-CM

## 2019-11-06 NOTE — Progress Notes (Signed)
Subjective:   Carolyn Campbell is a 64 y.o. female who presents for Medicare Annual (Subsequent) preventive examination.  Virtual Visit via Telephone Note  I connected with Carolyn Campbell on 11/06/19 at 11:20 AM EST by telephone and verified that I am speaking with the correct person using two identifiers.  Medicare Annual Wellness visit completed telephonically due to Covid-19 pandemic.   Location: Patient: home Provider: office   I discussed the limitations, risks, security and privacy concerns of performing an evaluation and management service by telephone and the availability of in person appointments. The patient expressed understanding and agreed to proceed.  Some vital signs may be absent or patient reported.   Clemetine Marker, LPN    Review of Systems:   Cardiac Risk Factors include: advanced age (>60mn, >>59women);hypertension;dyslipidemia     Objective:     Vitals: Temp 98 F (36.7 C)   Ht 5' (1.524 m)   Wt 143 lb (64.9 kg)   BMI 27.93 kg/m   Body mass index is 27.93 kg/m.  Advanced Directives 11/06/2019 06/28/2019 03/08/2019 11/02/2018 06/08/2017 04/15/2017 04/14/2017  Does Patient Have a Medical Advance Directive? No No No No No No No  Would patient like information on creating a medical advance directive? Yes (MAU/Ambulatory/Procedural Areas - Information given) No - Patient declined No - Patient declined Yes (MAU/Ambulatory/Procedural Areas - Information given) Yes (ED - Information included in AVS) No - Patient declined -    Tobacco Social History   Tobacco Use  Smoking Status Never Smoker  Smokeless Tobacco Never Used     Counseling given: Not Answered   Clinical Intake:  Pre-visit preparation completed: Yes  Pain : No/denies pain     BMI - recorded: 27.93 Nutritional Status: BMI 25 -29 Overweight Nutritional Risks: None Diabetes: No  How often do you need to have someone help you when you read instructions, pamphlets, or other written  materials from your doctor or pharmacy?: 1 - Never  Interpreter Needed?: No  Information entered by :: KClemetine MarkerLPN  Past Medical History:  Diagnosis Date  . Abdominal pain, epigastric   . Allergic rhinitis   . Anginal pain ([facility]    states MD said it was GERD  . Arthritis   . Asthma   . Breast cancer ([place] 2006   LT LUMPECTOMY  . CD (Crohn's disease) ([facility] 05/21/2015   FOLLOWED BY GI   . Clotting disorder ([place]   . Cognitive deficits as late effect of cerebrovascular disease   . COPD (chronic obstructive pulmonary disease) ([facility]   . Crohn's disease of both small and large intestine with rectal bleeding ([facility] 12/04/2014  . Depression    Currently taking zoloft.  . 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 Dyspnea   . Esophageal reflux   . Fever blister 07/19/2018  . Glaucoma    vitreous degeneration  . History of kidney stones   . Hyperlipidemia   . Hypertension   . Hypothyroidism   . Occlusion, cerebral artery    NOS w/infarction  . Osteoporosis   . Personal history of radiation therapy 2006   BREAST CA  . [place]   . Ulcer    Past Surgical History:  Procedure Laterality Date  . BREAST BIOPSY Left 2006   POS  . BREAST LUMPECTOMY Left 09/20/2004   positive  . BREAST SURGERY Left    malignant biopsy  . CARDIAC CATHETERIZATION    . COLONOSCOPY  2015  . COLONOSCOPY  WITH PROPOFOL N/A 06/08/2017   Procedure: COLONOSCOPY WITH PROPOFOL;  Surgeon: Lin Landsman, MD;  Location: [place];  Service: Gastroenterology;  Laterality: N/A;  . COLONOSCOPY WITH PROPOFOL N/A 03/08/2019   Procedure: COLONOSCOPY WITH PROPOFOL;  Surgeon: Lin Landsman, MD;  Location: [facility] ENDOSCOPY;  Service: Gastroenterology;  Laterality: N/A;  . ESOPHAGOGASTRODUODENOSCOPY  2015  . ESOPHAGOGASTRODUODENOSCOPY (EGD) WITH PROPOFOL N/A 06/08/2017   Procedure: ESOPHAGOGASTRODUODENOSCOPY (EGD) WITH PROPOFOL;  Surgeon: Lin Landsman, MD;  Location: [place];  Service: Gastroenterology;  Laterality: N/A;  . ESOPHAGOGASTRODUODENOSCOPY (EGD) WITH PROPOFOL N/A 03/08/2019   Procedure: ESOPHAGOGASTRODUODENOSCOPY (EGD) WITH PROPOFOL;  Surgeon: Lin Landsman, MD;  [facility] END[facility]COPY;  Service: Gastroenterology;  Laterality: N/A;  . FINGER SURGERY     Right small finger  . FRACTURE SURGERY    . [place] STUDY  2015  . TUBAL LIGATION     Family History  Problem Relation Age of Onset  . Heart disease Mother   . Heart attack Mother   . Breast cancer Sister   . Alcohol abuse Father   . Cerebrovascular Accident Father   . Arthritis Father   . Hypertension Father   . Hypertension Other   . Diabetes Other   . Breast cancer Sister 65  . Heart attack Other 41  . Cancer Sister    Social History   Socioeconomic History  . Marital status: Married    Spouse name: Not on file  . Number of children: 2  . Years of education: Not on file  . Highest education level: Some college, no degree  Occupational History  . Not on file  Tobacco Use  . Smoking status: Never Smoker  . Smokeless tobacco: Never Used  Substance and Sexual Activity  . Alcohol use: No    Alcohol/week: 0.0 standard drinks  . Drug use: No  . Sexual activity: Yes    Partners: Male    Birth control/protection: None  Other Topics Concern  . Not on file  Social History Narrative   Married. One son and one daughter. She is disabled after she had breast cancer and strokes. 2 caffeinated beverages daily.   Social Determinants of Health   Financial Resource Strain: Low Risk   . Difficulty of Paying Living Expenses: Not hard at all  Food Insecurity: No Food Insecurity  . Worried About Charity fundraiser in the Last Year: Never true  . Ran Out of Food in the Last Year: Never true  Transportation Needs: No Transportation Needs  . Lack of Transportation (Medical): No  . Lack of Transportation (Non-Medical): No  Physical Activity: Insufficiently Active    . Days of Exercise per Week: 2 days  . Minutes of Exercise per Session: 30 min  Stress: No Stress Concern Present  . Feeling of Stress : Not at all  Social Connections: Slightly Isolated  . Frequency of Communication with Friends and Family: More than three times a week  . Frequency of Social Gatherings with Friends and Family: Three times a week  . Attends Religious Services: More than 4 times per year  . Active Member of Clubs or Organizations: No  . Attends Archivist Meetings: Never  . Marital Status: Married    Outpatient Encounter Medications as of 11/06/2019  Medication Sig  . alendronate (FOSAMAX) 70 MG tablet TAKE 1 TABLET BY MOUTH ONCE A WEEK. TAKE WITH A FULL  GLASS OF WATER ON AN EMPTY  STOMACH.  Marland Kitchen atorvastatin (LIPITOR)  80 MG tablet Take 1 tablet (80 mg total) by mouth daily.  . diclofenac sodium (VOLTAREN) 1 % GEL Apply 2 g topically 4 (four) times daily.  Marland Kitchen gabapentin (NEURONTIN) 300 MG capsule Take 1 capsule (300 mg total) by mouth at bedtime.  . hydrochlorothiazide (HYDRODIURIL) 12.5 MG tablet Take 1 tablet (12.5 mg total) by mouth daily.  Marland Kitchen latanoprost (XALATAN) 0.005 % ophthalmic solution Place 1 drop into both eyes at bedtime.   Marland Kitchen levothyroxine (SYNTHROID) 50 MCG tablet Take 1 tablet (50 mcg total) by mouth daily.  . Multiple Vitamin (MULTIVITAMIN) tablet Take 1 tablet by mouth daily.  . sertraline (ZOLOFT) 50 MG tablet Take 1 tablet (50 mg total) by mouth daily.  . vedolizumab (ENTYVIO) 300 MG injection Inject into the vein.  Marland Kitchen omeprazole (PRILOSEC) 40 MG capsule Take 1 capsule (40 mg total) by mouth daily before breakfast.  . triamcinolone (KENALOG) 0.025 % ointment Apply 1 application topically 2 (two) times daily. (Patient not taking: Reported on 11/06/2019)  . valACYclovir (VALTREX) 1000 MG tablet Take 1 tablet (1,000 mg total) by mouth 2 (two) times daily. (Patient not taking: Reported on 11/06/2019)   No facility-administered encounter medications on  file as of 11/06/2019.    Activities of Daily Living In your present state of health, do you have any difficulty performing the following activities: 11/06/2019 06/21/2019  Hearing? N N  Comment declines hearing aids -  Vision? Y Y  Comment - glasses  Difficulty concentrating or making decisions? N N  Walking or climbing stairs? N N  Dressing or bathing? N N  Doing errands, shopping? N N  Preparing Food and eating ? N -  Using the Toilet? N -  In the past six months, have you accidently leaked urine? Y -  Comment wears pads for protection -  Do you have problems with loss of bowel control? N -  Managing your Medications? N -  Managing your Finances? N -  Housekeeping or managing your Housekeeping? N -  Some recent data might be hidden    Patient Care Team: Hubbard Hartshorn, FNP as PCP - General (Family Medicine) Marius Ditch, Tally Due, MD as Consulting Physician (Gastroenterology) Idelle Leech, [place] as Consulting Physician (Optometry) Beverly Gust, MD as Consulting Physician (Otolaryngology)    Assessment:   This is a routine wellness examination for Carolyn Campbell.  Exercise Activities and Dietary recommendations Current Exercise Habits: Home exercise routine, Type of exercise: walking;Other - see comments(exercise bike and ball), Time (Minutes): 30, Frequency (Times/Week): 2, Weekly Exercise (Minutes/Week): 60, Intensity: Moderate, Exercise limited by: neurologic condition(s)  Goals    . Patient Stated     Patient states she would like to travel more. She wants to visit grandchildren in [place].        Fall Risk Fall Risk  11/06/2019 09/11/2019 08/02/2019 06/21/2019 02/16/2019  Falls in the past year? 0 0 0 0 0  Number falls in past yr: 0 0 0 0 0  Injury with Fall? 0 0 0 0 0  Risk for fall due to : History of fall(s) History of fall(s) - - -  Follow up Falls prevention discussed Falls evaluation completed Falls evaluation completed - Falls evaluation completed   [place]:  Any stairs in or around the home? Yes  If so, do they handrails? Yes   Home free of loose throw rugs in walkways, pet beds, electrical cords, etc? Yes  Adequate lighting in your home to reduce  risk of falls? Yes   ASSISTIVE DEVICES UTILIZED TO PREVENT FALLS:  Life alert? No  Use of a cane, walker or w/c? No  Grab bars in the bathroom? Yes  Shower chair or bench in shower? No  Elevated toilet seat or a handicapped toilet? No   DME ORDERS:  DME order needed?  No   TIMED UP AND GO:  Was the test performed? No . Telephonic visit.   Education: Fall risk prevention has been discussed.  Intervention(s) required? No   Depression Screen PHQ 2/9 Scores 11/06/2019 09/11/2019 08/02/2019 06/21/2019  PHQ - 2 Score 0 0 2 1  PHQ- 9 Score - 0 10 8     Cognitive Function 6CIT deferred due to being followed by neurology for cognitive impairment     6CIT Screen 08/02/2019 11/02/2018 03/28/2018  What Year? 0 points 0 points 0 points  What month? 0 points 0 points 0 points  What time? 0 points 0 points 0 points  Count back from 20 0 points 0 points 0 points  Months in reverse 0 points 0 points 0 points  Repeat phrase 0 points 2 points 0 points  Total Score 0 2 0    Immunization History  Administered Date(s) Administered  . Fluad Quad(high Dose 65+) 06/25/2019  . Hepatitis A, Adult 06/22/2017  . Hepatitis B, adult 06/22/2017  . Hepb-cpg 04/11/2019  . Influenza Whole 07/05/2012  . Influenza, Seasonal, Injecte, Preservative Fre 06/08/2011  . Influenza,inj,Quad PF,6+ Mos 07/31/2014, 07/07/2015, 06/15/2016, 06/22/2017, 06/23/2018  . Influenza-Unspecified 07/21/2013  . PFIZER SARS-COV-2 Vaccination 11/03/2019  . Pneumococcal Conjugate-13 06/22/2017  . Pneumococcal Polysaccharide-23 03/27/2019  . Tdap 03/28/2012  . Zoster Recombinat (Shingrix) 04/11/2019    Qualifies for Shingles Vaccine? Yes . Due for second dose of Shingrix. Education has been  provided regarding the importance of this vaccine. Pt has been advised to call insurance company to determine out of pocket expense. Advised may also receive vaccine at local pharmacy or Health Dept. Verbalized acceptance and understanding.  Tdap: Up to date  Flu Vaccine: Up to date  Pneumococcal Vaccine: Up to date  Screening Tests Health Maintenance  Topic Date Due  . HIV Screening  11/20/1970  . PAP SMEAR-Modifier  12/20/2019  . MAMMOGRAM  08/09/2020  . COLONOSCOPY  03/07/2022  . TETANUS/TDAP  03/28/2022  . INFLUENZA VACCINE  Completed  . DEXA SCAN  Completed  . Hepatitis C Screening  Completed  . PNA vac Low Risk Adult  Completed    Cancer Screenings:  Colorectal Screening: Completed 03/08/19. Repeat every 3 years;   Mammogram: Completed 08/10/19. Repeat every year;.   Bone Density: Completed 06/02/17. Results reflect OSTEOPENIA. Repeat every 2 years. Ordered 06/21/19. Pt provided with contact information and advised to call to schedule appt.   Lung Cancer Screening: (Low Dose CT Chest recommended if Age 63-80 years, 30 pack-year currently smoking OR have quit w/in 15years.) does not qualify.   Additional Screening:  Hepatitis C Screening: does qualify; Completed 10/31/18  Vision Screening: Recommended annual ophthalmology exams for early detection of glaucoma and other disorders of the eye. Is the patient up to date with their annual eye exam?  Yes  Who is the provider or what is the name of the office in which the pt attends annual eye exams? Dr. Matilde Sprang  Dental Screening: Recommended annual dental exams for proper oral hygiene  Community Resource Referral:  CRR required this visit?  No      Plan:     I have  personally reviewed and addressed the Medicare Annual Wellness questionnaire and have noted the following in the patient's chart:  A. Medical and social history B. Use of alcohol, tobacco or illicit drugs  C. Current medications and supplements D. Functional  ability and status E.  Nutritional status F.  Physical activity G. Advance directives H. List of other physicians I.  Hospitalizations, surgeries, and ER visits in previous 12 months J.  [place] such as hearing and vision if needed, cognitive and depression L. Referrals and appointments   In addition, I have reviewed and discussed with patient certain preventive protocols, quality metrics, and best practice recommendations. A written personalized care plan for preventive services as well as general preventive health recommendations were provided to patient.   Signed,  Clemetine Marker, LPN Nurse Health Advisor   Nurse Notes: pt states she received first covid vaccine on 11/03/19 and has since developed cold sxs including sore throat, hoarseness, runny nose and ear pain. Denies fever, shortness of breath, loss of taste or smell or recent known covid exposure. Advised patient to contact office if sxs worsen or persist.

## 2019-11-06 NOTE — Patient Instructions (Signed)
Carolyn Campbell , Thank you for taking time to come for your Medicare Wellness Visit. I appreciate your ongoing commitment to your health goals. Please review the following plan we discussed and let me know if I can assist you in the future.   Screening recommendations/referrals: Colonoscopy: done 03/08/19. Repeat in 2023. Mammogram: done 08/10/19 Bone Density: done 06/02/17. Please call [phone] to schedule your bone density screening.  Recommended yearly ophthalmology/optometry visit for glaucoma screening and checkup Recommended yearly dental visit for hygiene and checkup  Vaccinations: Influenza vaccine: done 06/25/19 Pneumococcal vaccine: done 03/27/19 Tdap vaccine: done 03/28/12 Shingles vaccine: due for second dose of Shingrix    Advanced directives: Advance directive discussed with you today. I have provided a copy for you to complete at home and have notarized. Once this is complete please bring a copy in to our office so we can scan it into your chart.  Conditions/risks identified: Recommend drinking 6-8 glasses of water per day  Next appointment: Please follow up in one year for your Medicare Annual Wellness visit.     Preventive Care 42 Years and Older, Female Preventive care refers to lifestyle choices and visits with your health care provider that can promote health and wellness. What does preventive care include?  A yearly physical exam. This is also called an annual well check.  Dental exams once or twice a year.  Routine eye exams. Ask your health care provider how often you should have your eyes checked.  Personal lifestyle choices, including:  Daily care of your teeth and gums.  Regular physical activity.  Eating a healthy diet.  Avoiding tobacco and drug use.  Limiting alcohol use.  Practicing safe sex.  Taking low-dose aspirin every day.  Taking vitamin and mineral supplements as recommended by your health care provider. What happens during an annual  well check? The services and screenings done by your health care provider during your annual well check will depend on your age, overall health, lifestyle risk factors, and family history of disease. Counseling  Your health care provider may ask you questions about your:  Alcohol use.  Tobacco use.  Drug use.  Emotional well-being.  Home and relationship well-being.  Sexual activity.  Eating habits.  History of falls.  Memory and ability to understand (cognition).  Work and work Statistician.  Reproductive health. Screening  You may have the following tests or measurements:  Height, weight, and BMI.  Blood pressure.  Lipid and cholesterol levels. These may be checked every 5 years, or more frequently if you are over 19 years old.  Skin check.  Lung cancer screening. You may have this screening every year starting at age 66 if you have a 30-pack-year history of smoking and currently smoke or have quit within the past 15 years.  Fecal occult blood test (FOBT) of the stool. You may have this test every year starting at age 66.  Flexible sigmoidoscopy or colonoscopy. You may have a sigmoidoscopy every 5 years or a colonoscopy every 10 years starting at age 66.  Hepatitis C blood test.  Hepatitis B blood test.  Sexually transmitted disease (STD) testing.  Diabetes screening. This is done by checking your blood sugar (glucose) after you have not eaten for a while (fasting). You may have this done every 1-3 years.  Bone density scan. This is done to screen for osteoporosis. You may have this done starting at age 66.  Mammogram. This may be done every 1-2 years. Talk to your health care provider  about how often you should have regular mammograms. Talk with your health care provider about your test results, treatment options, and if necessary, the need for more tests. Vaccines  Your health care provider may recommend certain vaccines, such as:  Influenza vaccine. This  is recommended every year.  Tetanus, diphtheria, and acellular pertussis (Tdap, Td) vaccine. You may need a Td booster every 10 years.  Zoster vaccine. You may need this after age 66.  Pneumococcal 13-valent conjugate (PCV13) vaccine. One dose is recommended after age 80.  Pneumococcal polysaccharide (PPSV23) vaccine. One dose is recommended after age 23. Talk to your health care provider about which screenings and vaccines you need and how often you need them. This information is not intended to replace advice given to you by your health care provider. Make sure you discuss any questions you have with your health care provider. Document Released: 10/03/2015 Document Revised: 05/26/2016 Document Reviewed: 07/08/2015 Elsevier Interactive Patient Education  2017 [employer] Prevention in the Home Falls can cause injuries. They can happen to people of all ages. There are many things you can do to make your home safe and to help prevent falls. What can I do on the outside of my home?  Regularly fix the edges of walkways and driveways and fix any cracks.  Remove anything that might make you trip as you walk through a door, such as a raised step or threshold.  Trim any bushes or trees on the path to your home.  Use bright outdoor lighting.  Clear any walking paths of anything that might make someone trip, such as rocks or tools.  Regularly check to see if handrails are loose or broken. Make sure that both sides of any steps have handrails.  Any raised decks and porches should have guardrails on the edges.  Have any leaves, snow, or ice cleared regularly.  Use sand or salt on walking paths during winter.  Clean up any spills in your garage right away. This includes oil or grease spills. What can I do in the bathroom?  Use night lights.  Install grab bars by the toilet and in the tub and shower. Do not use towel bars as grab bars.  Use non-skid mats or decals in the tub or  shower.  If you need to sit down in the shower, use a plastic, non-slip stool.  Keep the floor dry. Clean up any water that spills on the floor as soon as it happens.  Remove soap buildup in the tub or shower regularly.  Attach bath mats securely with double-sided non-slip rug tape.  Do not have throw rugs and other things on the floor that can make you trip. What can I do in the bedroom?  Use night lights.  Make sure that you have a light by your bed that is easy to reach.  Do not use any sheets or blankets that are too big for your bed. They should not hang down onto the floor.  Have a firm chair that has side arms. You can use this for support while you get dressed.  Do not have throw rugs and other things on the floor that can make you trip. What can I do in the kitchen?  Clean up any spills right away.  Avoid walking on wet floors.  Keep items that you use a lot in easy-to-reach places.  If you need to reach something above you, use a strong step stool that has a grab bar.  Keep electrical cords out of the way.  Do not use floor polish or wax that makes floors slippery. If you must use wax, use non-skid floor wax.  Do not have throw rugs and other things on the floor that can make you trip. What can I do with my stairs?  Do not leave any items on the stairs.  Make sure that there are handrails on both sides of the stairs and use them. Fix handrails that are broken or loose. Make sure that handrails are as long as the stairways.  Check any carpeting to make sure that it is firmly attached to the stairs. Fix any carpet that is loose or worn.  Avoid having throw rugs at the top or bottom of the stairs. If you do have throw rugs, attach them to the floor with carpet tape.  Make sure that you have a light switch at the top of the stairs and the bottom of the stairs. If you do not have them, ask someone to add them for you. What else can I do to help prevent  falls?  Wear shoes that:  Do not have high heels.  Have rubber bottoms.  Are comfortable and fit you well.  Are closed at the toe. Do not wear sandals.  If you use a stepladder:  Make sure that it is fully opened. Do not climb a closed stepladder.  Make sure that both sides of the stepladder are locked into place.  Ask someone to hold it for you, if possible.  Clearly mark and make sure that you can see:  Any grab bars or handrails.  First and last steps.  Where the edge of each step is.  Use tools that help you move around (mobility aids) if they are needed. These include:  Canes.  Walkers.  Scooters.  Crutches.  Turn on the lights when you go into a dark area. Replace any light bulbs as soon as they burn out.  Set up your furniture so you have a clear path. Avoid moving your furniture around.  If any of your floors are uneven, fix them.  If there are any pets around you, be aware of where they are.  Review your medicines with your doctor. Some medicines can make you feel dizzy. This can increase your chance of falling. Ask your doctor what other things that you can do to help prevent falls. This information is not intended to replace advice given to you by your health care provider. Make sure you discuss any questions you have with your health care provider. Document Released: 07/03/2009 Document Revised: 02/12/2016 Document Reviewed: 10/11/2014 Elsevier Interactive Patient Education  2017 [employer].

## 2019-11-08 ENCOUNTER — Ambulatory Visit (INDEPENDENT_AMBULATORY_CARE_PROVIDER_SITE_OTHER): Payer: Medicare Other | Admitting: Family Medicine

## 2019-11-08 ENCOUNTER — Encounter: Payer: Self-pay | Admitting: Family Medicine

## 2019-11-08 ENCOUNTER — Other Ambulatory Visit: Payer: Self-pay

## 2019-11-08 VITALS — BP 142/72 | Temp 97.4°F | Wt 143.0 lb

## 2019-11-08 DIAGNOSIS — R059 Cough, unspecified: Secondary | ICD-10-CM

## 2019-11-08 DIAGNOSIS — D849 Immunodeficiency, unspecified: Secondary | ICD-10-CM

## 2019-11-08 DIAGNOSIS — R0989 Other specified symptoms and signs involving the circulatory and respiratory systems: Secondary | ICD-10-CM

## 2019-11-08 DIAGNOSIS — E038 Other specified hypothyroidism: Secondary | ICD-10-CM

## 2019-11-08 DIAGNOSIS — I700 Atherosclerosis of aorta: Secondary | ICD-10-CM | POA: Diagnosis not present

## 2019-11-08 DIAGNOSIS — F325 Major depressive disorder, single episode, in full remission: Secondary | ICD-10-CM

## 2019-11-08 DIAGNOSIS — I10 Essential (primary) hypertension: Secondary | ICD-10-CM

## 2019-11-08 DIAGNOSIS — I69359 Hemiplegia and hemiparesis following cerebral infarction affecting unspecified side: Secondary | ICD-10-CM

## 2019-11-08 DIAGNOSIS — R05 Cough: Secondary | ICD-10-CM

## 2019-11-08 DIAGNOSIS — B349 Viral infection, unspecified: Secondary | ICD-10-CM

## 2019-11-08 MED ORDER — HYDROCHLOROTHIAZIDE 12.5 MG PO TABS: 12.5000 mg | ORAL_TABLET | Freq: Every day | ORAL | 1 refills | Status: DC

## 2019-11-08 MED ORDER — BENZONATATE 100 MG PO CAPS: 100.0000 mg | ORAL_CAPSULE | Freq: Two times a day (BID) | ORAL | 0 refills | Status: DC | PRN

## 2019-11-08 MED ORDER — LEVOTHYROXINE SODIUM 50 MCG PO TABS: 50.0000 ug | ORAL_TABLET | Freq: Every day | ORAL | 1 refills | Status: DC

## 2019-11-08 MED ORDER — ALBUTEROL SULFATE HFA 108 (90 BASE) MCG/ACT IN AERS: 2.0000 | INHALATION_SPRAY | Freq: Four times a day (QID) | RESPIRATORY_TRACT | 0 refills | Status: DC | PRN

## 2019-11-08 NOTE — Progress Notes (Signed)
Name: Carolyn Campbell   MRN: [id number]    DOB: [birth year]   Date:11/08/2019       Progress Note  Subjective  Chief Complaint  Chief Complaint  Patient presents with  . Form    Handicap Form Completion. Gait issues due to stroke in 2008  . Pain    Chest onset 1 week. Tightness and congestion    I connected with  ACACIA KUSCH on 11/08/19 at 10:00 AM EST by telephone and verified that I am speaking with the correct person using two identifiers.  I discussed the limitations, risks, security and privacy concerns of performing an evaluation and management service by telephone and the availability of in person appointments. Staff also discussed with the patient that there may be a patient responsible charge related to this service. Patient Location: at home Provider Location: at home   HPI  Viral illness: she received her first dose of COVID-19 on Saturday Feb 13 th, 2021 and developed cold symptoms the following day described as rhinorrhea, cough , nasal congestion and chest congestion , she has a remote history of asthma, but she never smoked . She denies SOB or wheezing at this time, discussed how the vaccine works and possible side effects, explained that cough and rhinorrhea is not a usual side effects and recommend her to be tested for COVID -19 infection  History of CVA with left side hemiparesis: she continues to have some weakness on left side , she needs a new handicap sticker because she wants it to be on her  license plate . Last LDL was elevated, she is on high dose statin therapy but she states she was not as compliant with medication when labs were done but she states she has been compliant since October and we will recheck next visit   Hypothyroidism: she is taking medication as prescribed, last TSH is at goal, she denies weight change, hair loss or change . She has noticed some dysphagia, but that is chronic since Stroke and also states viral illness has bothered her also   Atherosclerosis of aorta: she is on statin therapy but cannot take aspirin because of Chron's disease  Major Depression: she is doing well on Zolof 50 mg denies suicidal thoughts or ideation   Patient Active Problem List   Diagnosis Date Noted  . Varicose veins of both lower extremities with pain 07/19/2018  . Arthritis of right hand 12/21/2017  . Immunosuppressed status ([place]) 12/21/2017  . Major depression in remission ([place]) 12/21/2017  . Fibrocystic breast changes 06/22/2017  . Osteopenia 10/18/2016  . Atherosclerosis of aorta ([place]) 12/19/2015  . Vitreous degeneration 05/21/2015  . Bad memory 05/21/2015  . Glaucoma associated with chamber angle anomalies 05/21/2015  . Dermatitis, eczematoid 05/21/2015  . CD (Crohn's disease) ([place]) 05/21/2015  . H/O malignant neoplasm of breast 05/21/2015  . Crohn's disease of both small and large intestine with rectal bleeding ([place]) 12/04/2014  . Solitary pulmonary nodule 11/07/2012  . 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 Cognitive deficits as late effect of cerebrovascular disease 01/20/2010  . CVA, old, hemiparesis ([place]) 04/28/2009  . Adult hypothyroidism 06/12/2008  . Acid reflux 05/23/2007  . Hypercholesterolemia without hypertriglyceridemia 01/18/2007  . Benign essential HTN 01/18/2007    Past Surgical History:  Procedure Laterality Date  . BREAST BIOPSY Left 2006   POS  . BREAST LUMPECTOMY Left 09/20/2004   positive  . BREAST SURGERY Left    malignant biopsy  . CARDIAC  CATHETERIZATION    . COLONOSCOPY  2015  . COLONOSCOPY WITH PROPOFOL N/A 06/08/2017   Procedure: COLONOSCOPY WITH PROPOFOL;  Surgeon: Lin Landsman, MD;  Location: [facility];  Service: Gastroenterology;  Laterality: N/A;  . COLONOSCOPY WITH PROPOFOL N/A 03/08/2019   Procedure: COLONOSCOPY WITH PROPOFOL;  Surgeon: Lin Landsman, MD;  Location[facility] END[facility]COPY;  Service: Gastroenterology;  Laterality: N/A;  .  ESOPHAGOGASTRODUODENOSCOPY  2015  . ESOPHAGOGASTRODUODENOSCOPY (EGD) WITH PROPOFOL N/A 06/08/2017   Procedure: ESOPHAGOGASTRODUODENOSCOPY (EGD) WITH PROPOFOL;  Surgeon: Lin Landsman, MD;  Location: [facility];  Service: Gastroenterology;  Laterality: N/A;  . ESOPHAGOGASTRODUODENOSCOPY (EGD) WITH PROPOFOL N/A 03/08/2019   Procedure: ESOPHAGOGASTRODUODENOSCOPY (EGD) WITH PROPOFOL;  Surgeon: Lin Landsman, MD;  Location: [facility] ENDOSCOPY;  Service: Gastroenterology;  Laterality: N/A;  . FINGER SURGERY     Right small finger  . FRACTURE SURGERY    . [place] STUDY  2015  . TUBAL LIGATION      Family History  Problem Relation Age of Onset  . Heart disease Mother   . Heart attack Mother   . Breast cancer Sister   . Alcohol abuse Father   . Cerebrovascular Accident Father   . Arthritis Father   . Hypertension Father   . Hypertension Other   . Diabetes Other   . Breast cancer Sister 21  . Heart attack Other 41  . Cancer Sister       Tobacco Use: Low Risk   . Smoking Tobacco Use: Never Smoker  . Smokeless Tobacco Use: Never Used   Social History   Substance and Sexual Activity  Alcohol Use No  . Alcohol/week: 0.0 standard drinks    Current Outpatient Medications:  .  alendronate (FOSAMAX) 70 MG tablet, TAKE 1 TABLET BY MOUTH ONCE A WEEK. TAKE WITH A FULL  GLASS OF WATER ON AN EMPTY  STOMACH., Disp: 12 tablet, Rfl: 3 .  atorvastatin (LIPITOR) 80 MG tablet, Take 1 tablet (80 mg total) by mouth daily., Disp: 90 tablet, Rfl: 3 .  brimonidine (ALPHAGAN) 0.2 % ophthalmic solution, 2 (two) times daily., Disp: , Rfl:  .  diclofenac sodium (VOLTAREN) 1 % GEL, Apply 2 g topically 4 (four) times daily., Disp: 100 g, Rfl: 0 .  gabapentin (NEURONTIN) 300 MG capsule, Take 1 capsule (300 mg total) by mouth at bedtime., Disp: 90 capsule, Rfl: 0 .  hydrochlorothiazide (HYDRODIURIL) 12.5 MG tablet, Take 1 tablet (12.5 mg total) by mouth daily., Disp: 90 tablet, Rfl: 1 .   latanoprost (XALATAN) 0.005 % ophthalmic solution, Place 1 drop into both eyes at bedtime. , Disp: , Rfl:  .  levothyroxine (SYNTHROID) 50 MCG tablet, Take 1 tablet (50 mcg total) by mouth daily., Disp: 90 tablet, Rfl: 1 .  Multiple Vitamin (MULTIVITAMIN) tablet, Take 1 tablet by mouth daily., Disp: , Rfl:  .  sertraline (ZOLOFT) 50 MG tablet, Take 1 tablet (50 mg total) by mouth daily., Disp: 90 tablet, Rfl: 1 .  triamcinolone (KENALOG) 0.025 % ointment, Apply 1 application topically 2 (two) times daily., Disp: 30 g, Rfl: 0 .  valACYclovir (VALTREX) 1000 MG tablet, Take 1 tablet (1,000 mg total) by mouth 2 (two) times daily., Disp: 30 tablet, Rfl: 5 .  vedolizumab (ENTYVIO) 300 MG injection, Inject into the vein., Disp: , Rfl:  .  omeprazole (PRILOSEC) 40 MG capsule, Take 1 capsule (40 mg total) by mouth daily before breakfast., Disp: 90 capsule, Rfl: 0  No Known Allergies  I personally reviewed active  problem list, medication list, allergies, family history, social history with the patient/caregiver today.   ROS  Ten systems reviewed and is negative except as mentioned in HPI   Objective  Virtual encounter, vitals obtained at home  Today's Vitals   11/08/19 0906  BP: (!) 142/72  Temp: (!) 97.4 F (36.3 C)  Weight: 143 lb (64.9 kg)  PainSc: 2    Body mass index is 27.93 kg/m.    Physical Exam  Awake, alert and oriented, she was coughing during our conversation    PHQ2/9: Depression screen [facility] 2/9 11/08/2019 11/06/2019 09/11/2019 08/02/2019 06/21/2019  Decreased Interest 0 0 0 1 0  Down, Depressed, Hopeless 0 0 0 1 1  PHQ - 2 Score 0 0 0 2 1  Altered sleeping 1 - 0 1 1  Tired, decreased energy 1 - 0 1 1  Change in appetite 2 - 0 1 2  Feeling bad or failure about yourself  0 - 0 1 2  Trouble concentrating 0 - 0 1 1  Moving slowly or fidgety/restless 0 - 0 2 0  Suicidal thoughts 0 - 0 1 0  PHQ-9 Score 4 - 0 10 8  Difficult doing work/chores Not difficult at all - Not  difficult at all Somewhat difficult Somewhat difficult  Some recent data might be hidden   PHQ-2/9 Result is negative.    Fall Risk: Fall Risk  11/08/2019 11/06/2019 09/11/2019 08/02/2019 06/21/2019  Falls in the past year? 0 0 0 0 0  Number falls in past yr: 0 0 0 0 0  Injury with Fall? 0 0 0 0 0  Risk for fall due to : - History of fall(s) History of fall(s) - -  Follow up - Falls prevention discussed Falls evaluation completed Falls evaluation completed -     Assessment & Plan  1. Immunosuppressed status ([place])  From Chron's therapy, stable  2. CVA, old, hemiparesis ([place])  I will fill out handicap forms when back at the office  3. Major depression in remission ([place])  Continue medications  4. Atherosclerosis of aorta ([place])  On statin therapy   5. Other specified hypothyroidism  - levothyroxine (SYNTHROID) 50 MCG tablet; Take 1 tablet (50 mcg total) by mouth daily.  Dispense: 90 tablet; Refill: 1  6. Benign essential HTN  - hydrochlorothiazide (HYDRODIURIL) 12.5 MG tablet; Take 1 tablet (12.5 mg total) by mouth daily.  Dispense: 90 tablet; Refill: 1  7. Viral illness  - benzonatate (TESSALON) 100 MG capsule; Take 1-2 capsules (100-200 mg total) by mouth 2 (two) times daily as needed.  Dispense: 40 capsule; Refill: 0 - albuterol (VENTOLIN HFA) 108 (90 Base) MCG/ACT inhaler; Inhale 2 puffs into the lungs every 6 (six) hours as needed for wheezing or shortness of breath.  Dispense: 18 g; Refill: 0  8. Cough  - benzonatate (TESSALON) 100 MG capsule; Take 1-2 capsules (100-200 mg total) by mouth 2 (two) times daily as needed.  Dispense: 40 capsule; Refill: 0 - albuterol (VENTOLIN HFA) 108 (90 Base) MCG/ACT inhaler; Inhale 2 puffs into the lungs every 6 (six) hours as needed for wheezing or shortness of breath.  Dispense: 18 g; Refill: 0  9. Chest congestion  Discussed importance of self isolation and also to call 911 if increase in SOB  - albuterol (VENTOLIN HFA) 108 (90  Base) MCG/ACT inhaler; Inhale 2 puffs into the lungs every 6 (six) hours as needed for wheezing or shortness of breath.  Dispense: 18 g; Refill: 0  I discussed the assessment and treatment plan with the patient. The patient was provided an opportunity to ask questions and all were answered. The patient agreed with the plan and demonstrated an understanding of the instructions.   The patient was advised to call back or seek an in-person evaluation if the symptoms worsen or if the condition fails to improve as anticipated.  I provided 25 minutes of non-face-to-face time during this encounter.  Loistine Chance, MD

## 2019-11-12 ENCOUNTER — Ambulatory Visit: Payer: Medicare Other | Admitting: Family Medicine

## 2019-11-12 ENCOUNTER — Other Ambulatory Visit: Payer: Medicare Other

## 2019-11-15 ENCOUNTER — Encounter: Payer: Self-pay | Admitting: Speech Pathology

## 2019-11-15 ENCOUNTER — Telehealth: Payer: Self-pay | Admitting: Family Medicine

## 2019-11-15 ENCOUNTER — Other Ambulatory Visit: Payer: Self-pay

## 2019-11-15 ENCOUNTER — Ambulatory Visit: Payer: Medicare Other | Admitting: Speech Pathology

## 2019-11-15 NOTE — Telephone Encounter (Signed)
Patient called to let us know she received paperwork in the mail

## 2019-11-15 NOTE — Telephone Encounter (Signed)
Pt came by the office to get a update on her Handicap form. Pt ask once it is complete could someone call her and also mail it to her.

## 2019-11-19 ENCOUNTER — Ambulatory Visit: Payer: Medicare Other | Admitting: Speech Pathology

## 2019-11-22 ENCOUNTER — Encounter: Payer: Medicare Other | Admitting: Speech Pathology

## 2019-11-24 ENCOUNTER — Ambulatory Visit: Payer: Medicare Other | Attending: Internal Medicine

## 2019-11-24 DIAGNOSIS — Z23 Encounter for immunization: Secondary | ICD-10-CM

## 2019-11-24 NOTE — Progress Notes (Signed)
   Covid-19 Vaccination Clinic  Name:  LAURENCE FOLZ    MRN: [id number] DOB: 09/12/1956  11/24/2019  Ms. Dollar was observed post Covid-19 immunization for 15 minutes without incident. She was provided with Vaccine Information Sheet and instruction to access the V-Safe system.   Ms. Norgard was instructed to call 911 with any severe reactions post vaccine: Marland Kitchen Difficulty breathing  . Swelling of face and throat  . A fast heartbeat  . A bad rash all over body  . Dizziness and weakness   Immunizations Administered    Name Date Dose VIS Date Route   Pfizer COVID-19 Vaccine 11/24/2019  9:23 AM 0.3 mL 08/31/2019 Intramuscular   Manufacturer: [place]   Lot: [id number]   [place]: [id number]

## 2019-11-26 ENCOUNTER — Encounter: Payer: Medicare Other | Admitting: Speech Pathology

## 2019-11-29 ENCOUNTER — Encounter: Payer: Medicare Other | Admitting: Speech Pathology

## 2019-12-03 ENCOUNTER — Encounter: Payer: Medicare Other | Admitting: Speech Pathology

## 2019-12-06 ENCOUNTER — Encounter: Payer: Medicare Other | Admitting: Speech Pathology

## 2019-12-13 ENCOUNTER — Encounter: Payer: Medicare Other | Admitting: Speech Pathology

## 2019-12-17 ENCOUNTER — Encounter: Payer: Medicare Other | Admitting: Speech Pathology

## 2019-12-20 ENCOUNTER — Encounter: Payer: Medicare Other | Admitting: Speech Pathology

## 2019-12-22 ENCOUNTER — Other Ambulatory Visit: Payer: Self-pay | Admitting: Gastroenterology

## 2019-12-24 ENCOUNTER — Encounter: Payer: Medicare Other | Admitting: Speech Pathology

## 2019-12-24 MED ORDER — OMEPRAZOLE 40 MG PO CPDR: 40.0000 mg | DELAYED_RELEASE_CAPSULE | Freq: Every day | ORAL | 0 refills | Status: DC

## 2019-12-27 ENCOUNTER — Encounter: Payer: Medicare Other | Admitting: Speech Pathology

## 2020-01-07 ENCOUNTER — Other Ambulatory Visit: Payer: Self-pay

## 2020-01-07 ENCOUNTER — Ambulatory Visit (INDEPENDENT_AMBULATORY_CARE_PROVIDER_SITE_OTHER): Payer: Medicare Other | Admitting: Gastroenterology

## 2020-01-07 ENCOUNTER — Encounter: Payer: Self-pay | Admitting: Gastroenterology

## 2020-01-07 VITALS — BP 148/76 | HR 69 | Temp 98.2°F | Wt 141.4 lb

## 2020-01-07 DIAGNOSIS — K5010 Crohn's disease of large intestine without complications: Secondary | ICD-10-CM

## 2020-01-07 MED ORDER — SHINGRIX 50 MCG/0.5ML IM SUSR: 0.5000 mL | Freq: Once | INTRAMUSCULAR | 0 refills | Status: AC

## 2020-01-07 MED ORDER — SUTAB 1479-225-188 MG PO TABS: 12.0000 | ORAL_TABLET | Freq: Two times a day (BID) | ORAL | 0 refills | Status: DC

## 2020-01-07 NOTE — Progress Notes (Signed)
Carolyn Darby, MD [address]  [place]  [place], [place] [address]  Main: [phone]  Fax: [phone]    Gastroenterology Consultation  Referring Provider:     Hubbard Hartshorn, FNP Primary Care Physician:  Hubbard Hartshorn, FNP Primary Gastroenterologist:  Dr. Cephas Campbell Reason for Consultation: Crohn's Disease        HPI:   Carolyn Campbell is a 64 y.o. African-American female referred by Dr. Uvaldo Rising, Astrid Divine, FNP  for consultation & management of Crohn's disease. This was diagnosed in 2015, currently on Imuran 50 mg daily. She reports intermittent flareups of her Crohn's disease such as abdominal pain, bloating, rectal bleeding. She received prednisone for flare up. She had 2 flareups within last 1 year. She recently is suffering with constipation, had to take laxatives to have a BM. Her last BM was 3 days ago. She does report some upper abdominal bloating. She denies any weight loss, rectal bleeding, diarrhea, nausea, vomiting. She is not on any prednisone currently. She denies taking NSAIDs.  Follow up visit 10/24/18 She thinks she has flare up of crohn's as she has mild abdominal discomfort that limited her PO intake. She continues to have atypical chest discomfort for which she is taking tums. She mentioned about chest pain during last visit, I urged her to see her PCP which she did not yet  Follow-up visit 11/06/2018 Since last visit, her symptoms worsened.  Therefore, patient made an urgent visit to see me today.  She reports upper abdominal pain limiting her p.o. intake, results in nausea, describes it as burning.  She is taking Tums and Prilosec as needed which provides some relief. She denies diarrhea.  She lost about 6 pounds in last 3 weeks.  She denies fever, chills.  She is accompanied by her husband today.  I was planning to start her on immunomodulator, she underwent screening for TB and hepatitis B, which were negative.  TPMT levels were normal.  Follow-up visit  11/20/2018 She reports feeling significantly better, reports mild pain in the epigastric region.  She started taking azathioprine 50 mg daily.  She tried prednisone 40 mg for 2 days then developed itching associated with rash in her hands, she felt itching might be secondary to prednisone so stopped taking it.  Rash currently resolved.  Eating well.  CT abdomen and pelvis revealed possible cystitis, UA showed leukocytes and she was given ceftriaxone at her PCPs office and given Bactrim twice daily for 5 days.  Urine cultures are negative  Follow-up visit 03/27/2019 Patient underwent EGD and colonoscopy.  EGD was unremarkable, colonoscopy revealed mild to moderate active colitis.  Patient continues to have mild symptoms of abdominal discomfort, blood mixed with stool and on wiping.  She denies diarrhea, weight loss.  In fact, she gained few pounds.  She feels tired, does not feel normal in her stomach. She is currently on azathioprine 50 mg daily.  Applied for [employer] as patient did not chose Humira as she does not prefer self injection.  She was previously on Remicade  Follow-up visit 05/29/2019 Patient received first dose of Entyvio on 05/23/2019 in [place].  She tolerated it well without any side effects.  She noticed improvement in her stool consistency as well as minimal rectal bleeding.  She has gained few pounds as well.  Today, she is concerned about 1 week history of left leg pain associated with some swelling and tenderness.  She denies fever, chills.  She continues to  take azathioprine 50 mg daily.  She finished prednisone course  Follow-up video visit 10/08/2019 Carolyn Campbell has history of Crohn's disease of the terminal ileum and colon diagnosed in 2015, previously maintained on Imuran, Remicade and intermittent prednisone use.  Colonoscopy 02/2019 revealed recurrence of active Crohn's colitis, started on Entyvio monotherapy.  Patient has been in clinical remission since then.  Currently off  azathioprine.  She reports having bowel movement every other day.  Reports feeling tired.  She denies abdominal pain, nausea or vomiting.  She had gained about 10 pounds since last visit.  Follow-up visit 01/07/2020 Carolyn Campbell reports that she has been in her best health in a while since starting Entyvio.  She is tolerating Entyvio very well, receiving it every 8 weeks.  Her most recent labs including fecal calprotectin levels, CRP, CBC, CMP were unremarkable.  Her weight has been stable.  She denies any GI symptoms today  Crohn's disease classification:  Age: > 40 Location: ileocolonic  Behavior: non stricturing, non penetrating  Perianal: no  IBD diagnosis: 11/2013  Disease course:Crohn's disease in 2015, initial symptoms were abdominal pain, blood in stool and on wiping, bloating. She had a colonoscopy, VCE, EGD at the time of diagnosis. She was initially on mesalamine 4 pills daily, prednisone later switched to Remicade in 11/2014. She took only 5 doses as she could not afford. Then she was switched to Imuran 50 mg daily. She had mild flareups about twice a year. Colonoscopy 05/2017 revealed mild ileocolonic disease primarily on histology. She self discontinued Imuran. Lialda started in 06/2017.  Possible exacerbation of Crohn's disease with elevated fecal calprotectin in 10/2018.  CT abdomen and pelvis unremarkable.  Discontinued Lialda, started on azathioprine 50 mg daily.  Flareup of Crohn's in 10/2018, confirmed on colonoscopy and elevated fecal calprotectin levels.  Short prednisone course and started Entyvio on 05/23/2019 Discontinued azathioprine in 09/2019.  Normal fecal calprotectin levels, normal CRP in 09/2019  Extra intestinal manifestations: None  IBD surgical history: None No family history of IBD Imaging:  MRE none CTE none SBFT none  Procedures:  Colonoscopy : 01/28/2005, showed external hemorrhoids only Colonoscopy 12/10/2013, showed normal terminal ileum, biopsies  performed. Skipped areas of nonbleeding ulcerative mucosa were seen in the entire colon, biopsies performed. 4 mm polyp in the sigmoid colon Pathology: Rectal biopsy mild-to-moderate active proctitis with architectural features of chronicity, negative for dysplasia and malignancy. Terminal ileum: Small bowel mucosa with preserved villous architecture. Right colon biopsy: No significant pathologic changes  Upper Endoscopy 12/10/2013, underwent dilation of the esophagus Ampullary biopsy: Normal, gastric biopsy: Intramucosal with erosion and mild chronic gastritis, negative for H. pylori, dysplasia and malignancy  VCE 05/06/2014: Erosions in the distal ileum  EGD and colonoscopy 03/08/2019  DIAGNOSIS:  A. DUODENUM POLYP; COLD BIOPSY:  - DUODENAL MUCOSA WITH PROMINENT BRUNNER'S GLAND AND REACTIVE CHANGES IN  OVERLYING VILLI.  - [place] ENTERITIS, DYSPLASIA, AND MALIGNANCY.   B. STOMACH, RANDOM; COLD BIOPSY:  - ANTRAL MUCOSA WITH CHANGES CONSISTENT WITH ENDOSCOPIC FINDING OF  ANTRAL EROSION.  - FRAGMENTS OF UNREMARKABLE OXYNTIC MUCOSA.  - NEGATIVE FOR H. PYLORI, DYSPLASIA, AND MALIGNANCY.   C. TERMINAL ILEUM; COLD BIOPSY:  - UNREMARKABLE SMALL INTESTINAL MUCOSA.  - NEGATIVE FOR ACTIVE ENTERITIS, DYSPLASIA, AND MALIGNANCY.   D. COLON, RANDOM RIGHT; COLD BIOPSY:  - MODERATE CHRONIC ACTIVE COLITIS CONSISTENT WITH PATIENT'S KNOWN  HISTORY OF CROHN'S.  - NEGATIVE FOR VIRAL CYTOPATHIC EFFECT, DYSPLASIA, AND MALIGNANCY.   E. COLON,  RANDOM TRANSVERSE; COLD BIOPSY:  - MILD CHRONIC ACTIVE COLITIS INVOLVING A MINORITY OF THE BIOPSY  FRAGMENTS.  - CHRONIC INACTIVE COLITIS INVOLVING THE MAJORITY OF THE BIOPSY  FRAGMENTS.  - NEGATIVE FOR VIRAL CYTOPATHIC EFFECT, DYSPLASIA, AND MALIGNANCY.   F. COLON, RANDOM LEFT; COLD BIOPSY:  - PATCHY MILD CHRONIC INACTIVE COLITIS.  - NEGATIVE FOR DYSPLASIA AND MALIGNANCY.   G. RECTUM, RANDOM PROCTITIS; COLD  BIOPSY:  - MARKED CHRONIC ACTIVE PROCTITIS CONSISTENT WITH PATIENT'S KNOWN  HISTORY OF CROHN'S.  - NEGATIVE FOR VIRAL CYTOPATHIC EFFECT, DYSPLASIA, AND MALIGNANCY.  EGD 06/08/2017 Normal esophagus, small hiatal hernia, normal stomach and duodenum  Colonoscopy 06/08/2017 The perianal and digital rectal examinations were normal. Pertinent negatives include normal sphincter tone and no palpable rectal Lesions. The terminal ileum appeared normal. Biopsies were taken with a cold forceps for histology. Two scattered non-bleeding aphthae were found in the ascending colon. Rest of the colon and rectum appeared normal. Random biopsies performed   IBD medications:  Steroids: Prednisone steroid responsive, budesonide 5-ASA: mesalamine, Lialda in the past Immunomodulators: azathioprine, discontinued in 2020 TPMT status normal Biologics:  Anti TNFs: Remicade monotherapy, received induction followed by 1 maintenance dose in 11/2014 Anti Integrins: Entyvio started on 05/23/2019 Ustekinumab: Tofactinib: Clinical trial:   Past Medical History:  Diagnosis Date   Abdominal pain, epigastric    Allergic rhinitis    Anginal pain ([place])    states MD said it was GERD   Arthritis    Asthma    Breast cancer ([place]) 2006   LT LUMPECTOMY   CD (Crohn's disease) ([facility] [facility]) 05/21/2015   FOLLOWED BY GI    Clotting disorder ([facility])    Cognitive deficits as late effect of cerebrovascular disease    COPD (chronic obstructive pulmonary disease) ([facility])    Crohn's disease of both small and large intestine with rectal bleeding ([place]) 12/04/2014   Depression    Currently taking zoloft.   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    Dyspnea    Esophageal reflux    Fever blister 07/19/2018   Glaucoma    vitreous degeneration   History of kidney stones    Hyperlipidemia    Hypertension    Hypothyroidism    Occlusion, cerebral artery    NOS w/infarction   Osteoporosis    Personal  history of radiation therapy 2006   BREAST CA   Stroke [facility])    Ulcer     Past Surgical History:  Procedure Laterality Date   BREAST BIOPSY Left 2006   POS   BREAST LUMPECTOMY Left 09/20/2004   positive   BREAST SURGERY Left    malignant biopsy   CARDIAC CATHETERIZATION     COLONOSCOPY  2015   COLONOSCOPY WITH PROPOFOL N/A 06/08/2017   Procedure: COLONOSCOPY WITH PROPOFOL;  Surgeon: Lin Landsman, MD;  Location: [facility];  Service: Gastroenterology;  Laterality: N/A;   COLONOSCOPY WITH PROPOFOL N/A 03/08/2019   Procedure: COLONOSCOPY WITH PROPOFOL;  Surgeon: Lin Landsman, MD;  Location: [facility] ENDOSCOPY;  Service: Gastroenterology;  Laterality: N/A;   ESOPHAGOGASTRODUODENOSCOPY  2015   ESOPHAGOGASTRODUODENOSCOPY (EGD) WITH PROPOFOL N/A 06/08/2017   Procedure: ESOPHAGOGASTRODUODENOSCOPY (EGD) WITH PROPOFOL;  Surgeon: Lin Landsman, MD;  Location: [facility];  Service: Gastroenterology;  Laterality: N/A;   ESOPHAGOGASTRODUODENOSCOPY (EGD) WITH PROPOFOL N/A 03/08/2019   Procedure: ESOPHAGOGASTRODUODENOSCOPY (EGD) WITH PROPOFOL;  Surgeon: Lin Landsman, MD;  Location: [facility] ENDOSCOPY;  Service: Gastroenterology;  Laterality: N/A;   FINGER SURGERY     Right  small finger   FRACTURE SURGERY     GIVENS CAPSULE STUDY  2015   TUBAL LIGATION       Current Outpatient Medications:    albuterol (VENTOLIN HFA) 108 (90 Base) MCG/ACT inhaler, Inhale 2 puffs into the lungs every 6 (six) hours as needed for wheezing or shortness of breath., Disp: 18 g, Rfl: 0   alendronate (FOSAMAX) 70 MG tablet, TAKE 1 TABLET BY MOUTH ONCE A WEEK. TAKE WITH A FULL  GLASS OF WATER ON AN EMPTY  STOMACH., Disp: 12 tablet, Rfl: 3   atorvastatin (LIPITOR) 80 MG tablet, Take 1 tablet (80 mg total) by mouth daily., Disp: 90 tablet, Rfl: 3   brimonidine (ALPHAGAN) 0.15 % ophthalmic solution, 1 drop 3 (three) times daily., Disp: , Rfl:    brimonidine (ALPHAGAN) 0.2  % ophthalmic solution, 2 (two) times daily., Disp: , Rfl:    diclofenac sodium (VOLTAREN) 1 % GEL, Apply 2 g topically 4 (four) times daily., Disp: 100 g, Rfl: 0   DUREZOL 0.05 % EMUL, Place 1 drop into the left eye 3 (three) times daily., Disp: , Rfl:    gabapentin (NEURONTIN) 300 MG capsule, Take 1 capsule (300 mg total) by mouth at bedtime., Disp: 90 capsule, Rfl: 0   hydrochlorothiazide (HYDRODIURIL) 12.5 MG tablet, Take 1 tablet (12.5 mg total) by mouth daily., Disp: 90 tablet, Rfl: 1   ketorolac (ACULAR) 0.5 % ophthalmic solution, Place 1 drop into the left eye 4 (four) times daily., Disp: , Rfl:    latanoprost (XALATAN) 0.005 % ophthalmic solution, Place 1 drop into both eyes at bedtime. , Disp: , Rfl:    levothyroxine (SYNTHROID) 50 MCG tablet, Take 1 tablet (50 mcg total) by mouth daily., Disp: 90 tablet, Rfl: 1   Multiple Vitamin (MULTIVITAMIN) tablet, Take 1 tablet by mouth daily., Disp: , Rfl:    omeprazole (PRILOSEC) 40 MG capsule, Take 1 capsule (40 mg total) by mouth daily before breakfast., Disp: 90 capsule, Rfl: 0   sertraline (ZOLOFT) 50 MG tablet, Take 1 tablet (50 mg total) by mouth daily., Disp: 90 tablet, Rfl: 1   triamcinolone (KENALOG) 0.025 % ointment, Apply 1 application topically 2 (two) times daily., Disp: 30 g, Rfl: 0   valACYclovir (VALTREX) 1000 MG tablet, Take 1 tablet (1,000 mg total) by mouth 2 (two) times daily., Disp: 30 tablet, Rfl: 5   vedolizumab (ENTYVIO) 300 MG injection, Inject into the vein., Disp: , Rfl:    Sodium Sulfate-Mag Sulfate-KCl (SUTAB) [phone] MG TABS, Take 12 tablets by mouth in the morning and at bedtime. At 5pm take 12 tablets and then 5 hours prior to colonoscopy take the other 12., Disp: 24 tablet, Rfl: 0   Zoster Vaccine Adjuvanted (SHINGRIX) injection, Inject 0.5 mLs into the muscle once for 1 dose., Disp: 0.5 mL, Rfl: 0   Family History  Problem Relation Age of Onset   Heart disease Mother    Heart attack Mother     Breast cancer Sister    Alcohol abuse Father    Cerebrovascular Accident Father    Arthritis Father    Hypertension Father    Hypertension Other    Diabetes Other    Breast cancer Sister 29   Heart attack Other 28   Cancer Sister      Social History   Tobacco Use   Smoking status: Never Smoker   Smokeless tobacco: Never Used  Substance Use Topics   Alcohol use: No    Alcohol/week: 0.0 standard drinks  Drug use: No    Allergies as of 01/07/2020   (No Known Allergies)    Review of Systems:    All systems reviewed and negative except where noted in HPI.   Physical Exam:  BP (!) 148/76 (BP Location: Left Arm, Patient Position: Sitting, Cuff Size: Normal)    Pulse 69    Temp 98.2 F (36.8 C) (Oral)    Wt 141 lb 6 oz (64.1 kg)    BMI 27.61 kg/m  No LMP recorded. Patient is postmenopausal.  General:   Alert,  Well-developed, well-nourished, pleasant and cooperative in NAD Head:  Normocephalic and atraumatic. Eyes:  Sclera clear, no icterus.   Conjunctiva pink. Ears:  Normal auditory acuity. Nose:  No deformity, discharge, or lesions. Mouth:  No deformity or lesions,oropharynx pink & moist. Neck:  Supple; no masses or thyromegaly. Lungs:  Respirations even and unlabored.  Clear throughout to auscultation.   No wheezes, crackles, or rhonchi. No acute distress. Heart:  Regular rate and rhythm; no murmurs, clicks, rubs, or gallops. Abdomen:  Normal bowel sounds.  No bruits.  Soft, nontender, and non-distended without masses, hepatosplenomegaly or hernias noted.  No guarding or rebound tenderness.   Rectal: Nor performed Msk:  Symmetrical without gross deformities. Good, equal movement & strength bilaterally. Pulses:  Normal pulses noted. Extremities:  No clubbing or edema.  No cyanosis. Neurologic:  Alert and oriented x3;  grossly normal neurologically. Skin:  Intact without significant lesions or rashes. No jaundice. Psych:  Alert and cooperative. Normal  mood and affect.  Imaging Studies: MRI reviewed  Assessment and Plan:   SURA CANUL is a 64 y.o. African-American female with Terminal ileal and colonic Crohn's, diagnosed in 2015 who was previously maintained on Imuran 50 mg daily with mild intermittent flareups about twice a year needing prednisone. She does not have any endoscopy evidence of ileocolonic Crohn's disease other than a few scattered aphthae in the ascending colon on colonoscopy in 05/2017 but histology revealed chronic mild active ileitis and right-sided colitis. MR enterography did not inflammation in small bowel. I tried to give her budesonide but she could not afford high co-pay of $500 for 30 days supply.  She stopped Imuran by herself in the past.  Recent flareup of her Crohn's disease with worsening of upper abdominal pain associated with nausea, unintentional weight loss, poor p.o. intake. Fecal calprotectin levels are elevated.  Discontinued Lialda, currently on azathioprine 50 mg daily.  She took prednisone for 2 days only due to new onset of rash and itching.  Rash has resolved.  EGD was unremarkable.  Repeat colonoscopy revealed active Crohn's colitis, worse in the rectum with mild symptoms.  Started on Entyvio on 05/23/2019, currently in clinical remission.  Crohn's disease of large intestine, nonstricturing, nonpenetrating Normal fecal calprotectin levels, normal CRP -Currently on Entyvio monotherapy -CBC, LFTs revealed infusion -Recommend colonoscopy to assess mucosal healing and patient is agreeable  IBD Health Maintenance  1.TB status: Gold quantiferon negative 09/2019 2. Anemia: normal Hb, vitamin B12 and ferritin levels are normal 3.Immunizations: Hep A immune, hepatitis B immune, and urine influenza vaccine, prevnar received in 06/2017, pneumovax administered on 03/27/2019.  Received first dose of Shingrix vaccine on 04/11/2019, due for booster, prescription given 4.Cancer screening I) Colon cancer/dysplasia  surveillance: Colonoscopy 03/08/2019, no evidence of dysplasia and no polyps identified.  II) Cervical cancer: n/a III) Skin cancer - n/a  5.Bone health Vitamin D status: Normal Bone density testing: Not done, ordered by her PCP 5. Labs: Every  3 months 6. Smoking: Never smoked 7. NSAIDs and Antibiotics use: Denies NSAID use and antibiotic use  Follow up in 4-6 months   Carolyn Darby, MD

## 2020-02-22 ENCOUNTER — Ambulatory Visit: Payer: Medicare Other | Admitting: Family Medicine

## 2020-03-04 ENCOUNTER — Ambulatory Visit (INDEPENDENT_AMBULATORY_CARE_PROVIDER_SITE_OTHER): Payer: Medicare Other | Admitting: Family Medicine

## 2020-03-04 ENCOUNTER — Encounter: Payer: Self-pay | Admitting: Family Medicine

## 2020-03-04 ENCOUNTER — Other Ambulatory Visit: Payer: Self-pay

## 2020-03-04 VITALS — BP 124/82 | HR 87 | Temp 97.1°F | Resp 16 | Ht 60.0 in | Wt 142.0 lb

## 2020-03-04 DIAGNOSIS — F325 Major depressive disorder, single episode, in full remission: Secondary | ICD-10-CM

## 2020-03-04 DIAGNOSIS — I10 Essential (primary) hypertension: Secondary | ICD-10-CM

## 2020-03-04 DIAGNOSIS — Z5181 Encounter for therapeutic drug level monitoring: Secondary | ICD-10-CM

## 2020-03-04 DIAGNOSIS — E038 Other specified hypothyroidism: Secondary | ICD-10-CM

## 2020-03-04 DIAGNOSIS — E7800 Pure hypercholesterolemia, unspecified: Secondary | ICD-10-CM | POA: Diagnosis not present

## 2020-03-04 DIAGNOSIS — E039 Hypothyroidism, unspecified: Secondary | ICD-10-CM | POA: Diagnosis not present

## 2020-03-04 LAB — COMPLETE METABOLIC PANEL WITH GFR
AG Ratio: 1.5 (calc) (ref 1.0–2.5)
ALT: 18 U/L (ref 6–29)
AST: 16 U/L (ref 10–35)
Albumin: 4.4 g/dL (ref 3.6–5.1)
Alkaline phosphatase (APISO): 84 U/L (ref 37–153)
BUN: 15 mg/dL (ref 7–25)
CO2: 30 mmol/L (ref 20–32)
Calcium: 10.2 mg/dL (ref 8.6–10.4)
Chloride: 102 mmol/L (ref 98–110)
Creat: 0.89 mg/dL (ref 0.50–0.99)
GFR, Est African American: 78 mL/min/{1.73_m2} (ref >=60)
GFR, Est Non African American: 68 mL/min/{1.73_m2} (ref >=60)
Globulin: 2.9 g/dL (calc) (ref 1.9–3.7)
Glucose, Bld: 111 mg/dL — ABNORMAL HIGH (ref 65–99)
Potassium: 4 mmol/L (ref 3.5–5.3)
Sodium: 141 mmol/L (ref 135–146)
Total Bilirubin: 0.6 mg/dL (ref 0.2–1.2)
Total Protein: 7.3 g/dL (ref 6.1–8.1)

## 2020-03-04 LAB — LIPID PANEL
Cholesterol: 222 mg/dL — ABNORMAL HIGH (ref <200)
HDL: 96 mg/dL (ref >=50)
LDL Cholesterol (Calc): 110 mg/dL (calc) — ABNORMAL HIGH
Non-HDL Cholesterol (Calc): 126 mg/dL (calc) (ref <130)
Total CHOL/HDL Ratio: 2.3 (calc) (ref <5.0)
Triglycerides: 74 mg/dL (ref <150)

## 2020-03-04 LAB — CBC WITH DIFFERENTIAL/PLATELET
Absolute Monocytes: 360 cells/uL (ref 200–950)
Basophils Absolute: 69 cells/uL (ref 0–200)
Basophils Relative: 1.3 %
Eosinophils Absolute: 159 cells/uL (ref 15–500)
Eosinophils Relative: 3 %
HCT: 38.8 % (ref 35.0–45.0)
Hemoglobin: 12.8 g/dL (ref 11.7–15.5)
Lymphs Abs: 1972 cells/uL (ref 850–3900)
MCH: 29.6 pg (ref 27.0–33.0)
MCHC: 33 g/dL (ref 32.0–36.0)
MCV: 89.6 fL (ref 80.0–100.0)
MPV: 10.8 fL (ref 7.5–12.5)
Monocytes Relative: 6.8 %
Neutro Abs: 2740 cells/uL (ref 1500–7800)
Neutrophils Relative %: 51.7 %
Platelets: 215 10*3/uL (ref 140–400)
RBC: 4.33 10*6/uL (ref 3.80–5.10)
RDW: 12.8 % (ref 11.0–15.0)
Total Lymphocyte: 37.2 %
WBC: 5.3 10*3/uL (ref 3.8–10.8)

## 2020-03-04 LAB — TSH: TSH: 1.1 mIU/L (ref 0.40–4.50)

## 2020-03-04 MED ORDER — HYDROCHLOROTHIAZIDE 12.5 MG PO TABS: 12.5000 mg | ORAL_TABLET | Freq: Every day | ORAL | 3 refills | Status: DC

## 2020-03-04 MED ORDER — SERTRALINE HCL 25 MG PO TABS: ORAL_TABLET | ORAL | 0 refills | Status: DC

## 2020-03-04 NOTE — Progress Notes (Signed)
Name: Carolyn Campbell   MRN: [id number]    DOB: 1956/08/09   Date:03/04/2020       Progress Note  Chief Complaint  Patient presents with  . Follow-up  . Hypertension  . Depression  . Hypothyroidism  . Hyperlipidemia     Subjective:   Carolyn Campbell is a 64 y.o. female, presents to clinic for routine follow up on the conditions listed above.  Hypothyroid - on 50 mcg taking in the morning with breakfast and omeprazole.  She is also taking with food and all of her other morning medications.  Her weight has been stable and she does not feel any decrease energy but she does note that she feels swollen everywhere and puffy and she would like to lose weight.  She states her mood is stable she denies any changes to her hair skin or bowels.  We will check her thyroid labs today  HTN: Managed with hydrochlorothiazide 12.5 mg daily, she denies any medication side effects or concerns she notes good compliance she states that she does have polyuria all day and all night BP Readings from Last 3 Encounters:  03/04/20 124/82  01/07/20 (!) 148/76  11/08/19 (!) 142/72  Blood pressure well controlled today  HLD - compliant with statin, on atorvastatin 80 mg daily, reviewed most recent labs-lipids have been well controlled on her current medicine and dose She denies myalgias, exertional chest pain, claudication type symptoms  MDD: Patient has past diagnosis of MDD in remission, her PHQ-9 was reviewed today was negative she reports that her symptoms have been well controlled on Zoloft 50 mg daily.  She reports that she would like to continue to decrease this medication and eventually be off medications if possible.  I reviewed her med list and past dosing she had previously been on Zoloft 100 mg and was successfully weaned down to 2.     Patient Active Problem List   Diagnosis Date Noted  . Varicose veins of both lower extremities with pain 07/19/2018  . Arthritis of right hand 12/21/2017  .  Immunosuppressed status ([place]) 12/21/2017  . Major depression in remission ([place]) 12/21/2017  . Fibrocystic breast changes 06/22/2017  . Osteopenia 10/18/2016  . Atherosclerosis of aorta ([place]) 12/19/2015  . Vitreous degeneration 05/21/2015  . Bad memory 05/21/2015  . Glaucoma associated with chamber angle anomalies 05/21/2015  . Dermatitis, eczematoid 05/21/2015  . CD (Crohn's disease) ([place]) 05/21/2015  . H/O malignant neoplasm of breast 05/21/2015  . Crohn's disease of both small and large intestine with rectal bleeding ([place]) 12/04/2014  . Solitary pulmonary nodule 11/07/2012  . 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 Cognitive deficits as late effect of cerebrovascular disease 01/20/2010  . CVA, old, hemiparesis ([place]) 04/28/2009  . Adult hypothyroidism 06/12/2008  . Acid reflux 05/23/2007  . Hypercholesterolemia without hypertriglyceridemia 01/18/2007  . Benign essential HTN 01/18/2007    Past Surgical History:  Procedure Laterality Date  . BREAST BIOPSY Left 2006   POS  . BREAST LUMPECTOMY Left 09/20/2004   positive  . BREAST SURGERY Left    malignant biopsy  . CARDIAC CATHETERIZATION    . COLONOSCOPY  2015  . COLONOSCOPY WITH PROPOFOL N/A 06/08/2017   Procedure: COLONOSCOPY WITH PROPOFOL;  Surgeon: Lin Landsman, MD;  Location: [place];  Service: Gastroenterology;  Laterality: N/A;  . COLONOSCOPY WITH PROPOFOL N/A 03/08/2019   Procedure: COLONOSCOPY WITH PROPOFOL;  Surgeon: Lin Landsman, MD;  Location: [facility] ENDOSCOPY;  Service:  Gastroenterology;  Laterality: N/A;  . ESOPHAGOGASTRODUODENOSCOPY  2015  . ESOPHAGOGASTRODUODENOSCOPY (EGD) WITH PROPOFOL N/A 06/08/2017   Procedure: ESOPHAGOGASTRODUODENOSCOPY (EGD) WITH PROPOFOL;  Surgeon: Lin Landsman, MD;  Location: [place];  Service: Gastroenterology;  Laterality: N/A;  . ESOPHAGOGASTRODUODENOSCOPY (EGD) WITH PROPOFOL N/A 03/08/2019   Procedure:  ESOPHAGOGASTRODUODENOSCOPY (EGD) WITH PROPOFOL;  Surgeon: Lin Landsman, MD;  Location: [facility] ENDOSCOPY;  Service: Gastroenterology;  Laterality: N/A;  . FINGER SURGERY     Right small finger  . FRACTURE SURGERY    . [place] STUDY  2015  . TUBAL LIGATION      Family History  Problem Relation Age of Onset  . Heart disease Mother   . Heart attack Mother   . Breast cancer Sister   . Alcohol abuse Father   . Cerebrovascular Accident Father   . Arthritis Father   . Hypertension Father   . Hypertension Other   . Diabetes Other   . Breast cancer Sister 28  . Heart attack Other 41  . Cancer Sister     Social History   Tobacco Use  . Smoking status: Never Smoker  . Smokeless tobacco: Never Used  Vaping Use  . Vaping Use: Never used  Substance Use Topics  . Alcohol use: No    Alcohol/week: 0.0 standard drinks  . Drug use: No      Current Outpatient Medications:  .  albuterol (VENTOLIN HFA) 108 (90 Base) MCG/ACT inhaler, Inhale 2 puffs into the lungs every 6 (six) hours as needed for wheezing or shortness of breath., Disp: 18 g, Rfl: 0 .  alendronate (FOSAMAX) 70 MG tablet, TAKE 1 TABLET BY MOUTH ONCE A WEEK. TAKE WITH A FULL  GLASS OF WATER ON AN EMPTY  STOMACH., Disp: 12 tablet, Rfl: 3 .  atorvastatin (LIPITOR) 80 MG tablet, Take 1 tablet (80 mg total) by mouth daily., Disp: 90 tablet, Rfl: 3 .  brimonidine (ALPHAGAN) 0.15 % ophthalmic solution, 1 drop 3 (three) times daily., Disp: , Rfl:  .  brimonidine (ALPHAGAN) 0.2 % ophthalmic solution, 2 (two) times daily., Disp: , Rfl:  .  diclofenac sodium (VOLTAREN) 1 % GEL, Apply 2 g topically 4 (four) times daily., Disp: 100 g, Rfl: 0 .  DUREZOL 0.05 % EMUL, Place 1 drop into the left eye 3 (three) times daily., Disp: , Rfl:  .  gabapentin (NEURONTIN) 300 MG capsule, Take 1 capsule (300 mg total) by mouth at bedtime., Disp: 90 capsule, Rfl: 0 .  hydrochlorothiazide (HYDRODIURIL) 12.5 MG tablet, Take 1 tablet (12.5 mg  total) by mouth daily., Disp: 90 tablet, Rfl: 1 .  ketorolac (ACULAR) 0.5 % ophthalmic solution, Place 1 drop into the left eye 4 (four) times daily., Disp: , Rfl:  .  latanoprost (XALATAN) 0.005 % ophthalmic solution, Place 1 drop into both eyes at bedtime. , Disp: , Rfl:  .  levothyroxine (SYNTHROID) 50 MCG tablet, Take 1 tablet (50 mcg total) by mouth daily., Disp: 90 tablet, Rfl: 1 .  Multiple Vitamin (MULTIVITAMIN) tablet, Take 1 tablet by mouth daily., Disp: , Rfl:  .  omeprazole (PRILOSEC) 40 MG capsule, Take 1 capsule (40 mg total) by mouth daily before breakfast., Disp: 90 capsule, Rfl: 0 .  sertraline (ZOLOFT) 50 MG tablet, Take 1 tablet (50 mg total) by mouth daily., Disp: 90 tablet, Rfl: 1 .  Sodium Sulfate-Mag Sulfate-KCl (SUTAB) [phone] MG TABS, Take 12 tablets by mouth in the morning and at bedtime. At 5pm take 12 tablets and then  5 hours prior to colonoscopy take the other 12., Disp: 24 tablet, Rfl: 0 .  triamcinolone (KENALOG) 0.025 % ointment, Apply 1 application topically 2 (two) times daily., Disp: 30 g, Rfl: 0 .  valACYclovir (VALTREX) 1000 MG tablet, Take 1 tablet (1,000 mg total) by mouth 2 (two) times daily., Disp: 30 tablet, Rfl: 5 .  vedolizumab (ENTYVIO) 300 MG injection, Inject into the vein., Disp: , Rfl:   No Known Allergies  Chart Review Today: I personally reviewed active problem list, medication list, allergies, family history, social history, health maintenance, notes from last encounter, lab results, imaging with the patient/caregiver today.   Review of Systems  10 Systems reviewed and are negative for acute change except as noted in the HPI.' Objective:    Vitals:   03/04/20 1502  BP: 124/82  Pulse: 87  Resp: 16  Temp: (!) 97.1 F (36.2 C)  TempSrc: Temporal  SpO2: 95%  Weight: 142 lb (64.4 kg)  Height: 5' (1.524 m)    Body mass index is 27.73 kg/m.  Physical Exam Vitals and nursing note reviewed.  Constitutional:      General: She is  not in acute distress.    Appearance: Normal appearance. She is normal weight. She is not ill-appearing, toxic-appearing or diaphoretic.  HENT:     Head: Normocephalic and atraumatic.  Cardiovascular:     Rate and Rhythm: Normal rate and regular rhythm.     Pulses: Normal pulses.     Heart sounds: Normal heart sounds. No murmur heard.  No friction rub. No gallop.   Pulmonary:     Effort: Pulmonary effort is normal. No respiratory distress.     Breath sounds: Normal breath sounds. No wheezing or rales.  Abdominal:     General: Bowel sounds are normal. There is no distension.     Palpations: Abdomen is soft.     Tenderness: There is no abdominal tenderness.  Musculoskeletal:     Right lower leg: No edema.     Left lower leg: No edema.  Skin:    General: Skin is warm.  Neurological:     Mental Status: She is alert.  Psychiatric:        Mood and Affect: Mood normal.        Behavior: Behavior normal.       PHQ2/9: Depression screen [facility] 2/9 03/04/2020 11/08/2019 11/06/2019 09/11/2019 08/02/2019  Decreased Interest 0 0 0 0 1  Down, Depressed, Hopeless 0 0 0 0 1  PHQ - 2 Score 0 0 0 0 2  Altered sleeping 0 1 - 0 1  Tired, decreased energy 0 1 - 0 1  Change in appetite 0 2 - 0 1  Feeling bad or failure about yourself  0 0 - 0 1  Trouble concentrating 0 0 - 0 1  Moving slowly or fidgety/restless 0 0 - 0 2  Suicidal thoughts 0 0 - 0 1  PHQ-9 Score 0 4 - 0 10  Difficult doing work/chores Not difficult at all Not difficult at all - Not difficult at all Somewhat difficult  Some recent data might be hidden    phq 9 is neg, reviewed - see HPI and A&P  Fall Risk: Fall Risk  11/08/2019 11/06/2019 09/11/2019 08/02/2019 06/21/2019  Falls in the past year? 0 0 0 0 0  Number falls in past yr: 0 0 0 0 0  Injury with Fall? 0 0 0 0 0  Risk for fall due to : - History of  fall(s) History of fall(s) - -  Follow up - Falls prevention discussed Falls evaluation completed Falls evaluation completed  -     Assessment & Plan:     ICD-10-CM   1. Adult hypothyroidism  E03.9 TSH   Taking medications with PPI and food will check labs, possible chemical hypothyroid due to med administration?  Discussed how to correctly take medicines  2. Benign essential HTN  R71 COMPLETE METABOLIC PANEL WITH GFR    hydrochlorothiazide (HYDRODIURIL) 12.5 MG tablet   Stable, well-controlled blood pressure on HCTZ 12.5, notes urinary frequency, patient new to me, no allergies could try ACE ARB or Norvasc?  3. Hypercholesterolemia without hypertriglyceridemia  H65.79 COMPLETE METABOLIC PANEL WITH GFR    Lipid panel   Compliant with meds, last labs lipids are well controlled, tolerating statin without any concerning side effects, check labs today and repeat annually if normal  4. Major depression in remission (HCC)  F32.5 sertraline (ZOLOFT) 25 MG tablet   Well-controlled depressive symptoms on Zoloft, PHQ negative.  She would like to been off, gradually decrease dose down to 25, dose taper sent in, close f/up  5. Encounter for medication monitoring  Z51.81 CBC with Differential/Platelet    COMPLETE METABOLIC PANEL WITH GFR    Lipid panel    TSH     Return for Routine follow-up 6 months MDD/HTN/thyroid/HLD.   Patient was instructed to follow-up sooner if she has any difficulty with dose taper of Zoloft, encouraged her to let me know in about 1 month if she was able to decrease the dose to 25 successfully or if she would like to further decrease the dose.  Her other routine follow-up I have requested her to return in about 6 months.  Delsa Grana, PA-C 03/04/20 3:01 PM

## 2020-03-04 NOTE — Patient Instructions (Signed)
Gradually decrease your zoloft dose and let me know if you tolerate going down to 25 mg daily  Take your omeprazole and levothyroxine medicines at different times of day on an empty stomach, more than 4 hours apart

## 2020-03-05 ENCOUNTER — Telehealth: Payer: Self-pay

## 2020-03-05 NOTE — Telephone Encounter (Signed)
Patient is calling because she can not do her colonoscopy on June 23. Patient wants it moved to middle of August. Scheduled patient to August 23 and informed Endo. Informed patient she would go for COVID on 05/08/2020

## 2020-03-06 ENCOUNTER — Other Ambulatory Visit: Payer: Self-pay | Admitting: Family Medicine

## 2020-03-06 MED ORDER — LOSARTAN POTASSIUM-HCTZ 50-12.5 MG PO TABS: 0.5000 | ORAL_TABLET | Freq: Every day | ORAL | 3 refills | Status: DC

## 2020-03-06 MED ORDER — LEVOTHYROXINE SODIUM 50 MCG PO TABS: 50.0000 ug | ORAL_TABLET | Freq: Every day | ORAL | 3 refills | Status: DC

## 2020-03-06 MED ORDER — ROSUVASTATIN CALCIUM 20 MG PO TABS
20.0000 mg | ORAL_TABLET | Freq: Every day | ORAL | 3 refills | Status: DC
Start: 2020-03-06 — End: 2021-01-30

## 2020-03-06 NOTE — Addendum Note (Signed)
Addended by: Delsa Grana on: 03/06/2020 12:24 PM   Modules accepted: Orders

## 2020-03-18 ENCOUNTER — Other Ambulatory Visit: Payer: Self-pay | Admitting: Gastroenterology

## 2020-03-18 NOTE — Telephone Encounter (Signed)
Last office visit 01/07/2020 Crohn's disease  Last refill 12/24/2019 0 refills

## 2020-03-19 MED ORDER — OMEPRAZOLE 40 MG PO CPDR: 40.0000 mg | DELAYED_RELEASE_CAPSULE | Freq: Every day | ORAL | 0 refills | Status: DC

## 2020-04-21 ENCOUNTER — Telehealth: Payer: Self-pay

## 2020-04-21 NOTE — Telephone Encounter (Signed)
Patient called stating she wanted to cancel colonoscopy for 05/12/2020 because her husband is having foot surgery. Patient states she will call back to rescheduled the colonoscopy. She states she does not know when she can do it. Called ENDO and talk to [place] and she states she will cancel the colonoscopy

## 2020-05-12 ENCOUNTER — Ambulatory Visit: Admit: 2020-05-12 | Payer: Medicare Other | Admitting: Gastroenterology

## 2020-05-12 SURGERY — COLONOSCOPY WITH PROPOFOL
Anesthesia: General

## 2020-05-29 ENCOUNTER — Ambulatory Visit
Admission: RE | Admit: 2020-05-29 | Discharge: 2020-05-29 | Disposition: A | Discharge status: Home / Self Care | Payer: Medicare Other | Attending: Family Medicine | Admitting: Family Medicine

## 2020-05-29 ENCOUNTER — Other Ambulatory Visit: Payer: Self-pay

## 2020-05-29 ENCOUNTER — Ambulatory Visit
Admission: RE | Admit: 2020-05-29 | Discharge: 2020-05-29 | Disposition: A | Discharge status: Home / Self Care | Payer: Medicare Other | Source: Ambulatory Visit | Attending: Family Medicine | Admitting: Family Medicine

## 2020-05-29 ENCOUNTER — Encounter: Payer: Self-pay | Admitting: Family Medicine

## 2020-05-29 ENCOUNTER — Ambulatory Visit (INDEPENDENT_AMBULATORY_CARE_PROVIDER_SITE_OTHER): Payer: Medicare Other | Admitting: Family Medicine

## 2020-05-29 VITALS — BP 124/76 | HR 88 | Temp 97.9°F | Resp 16 | Ht 60.0 in | Wt 147.0 lb

## 2020-05-29 DIAGNOSIS — M25572 Pain in left ankle and joints of left foot: Secondary | ICD-10-CM | POA: Insufficient documentation

## 2020-05-29 DIAGNOSIS — Z23 Encounter for immunization: Secondary | ICD-10-CM | POA: Diagnosis not present

## 2020-05-29 DIAGNOSIS — M79672 Pain in left foot: Secondary | ICD-10-CM | POA: Insufficient documentation

## 2020-05-29 DIAGNOSIS — L819 Disorder of pigmentation, unspecified: Secondary | ICD-10-CM

## 2020-05-29 MED ORDER — TRIAMCINOLONE ACETONIDE 0.1 % EX OINT: TOPICAL_OINTMENT | CUTANEOUS | 0 refills | Status: DC

## 2020-05-29 MED ORDER — NAPROXEN 500 MG PO TABS: 500.0000 mg | ORAL_TABLET | Freq: Two times a day (BID) | ORAL | 1 refills | Status: DC

## 2020-05-29 NOTE — Patient Instructions (Signed)
Elevate your foot, apply ice to the painful and swollen area Use tylenol and aleve as needed and directed on bottles   We will get an xray done today - if you do not improve over the next 2-4 weeks with conservative treatment then we will get you to a specialist   RICE Therapy for Routine Care of Injuries Many injuries can be cared for with rest, ice, compression, and elevation (RICE therapy). This includes:  Resting the injured part.  Putting ice on the injury.  Putting pressure (compression) on the injury.  Raising the injured part (elevation). Using RICE therapy can help to lessen pain and swelling. Supplies needed:  Ice.  Plastic bag.  Towel.  Elastic bandage.  Pillow or pillows to raise (elevate) your injured body part. How to care for your injury with RICE therapy Rest Limit your normal activities, and try not to use the injured part of your body. You can go back to your normal activities when your doctor says it is okay to do them and you feel okay. Ask your doctor if you should do exercises to help your injury get better. Ice Put ice on the injured area. Do not put ice on your bare skin.  Put ice in a plastic bag.  Place a towel between your skin and the bag.  Leave the ice on for 20 minutes, 2-3 times a day. Use ice on as many days as told by your doctor.  Compression Compression means putting pressure on the injured area. This can be done with an elastic bandage. If an elastic bandage has been put on your injury:  Do not wrap the bandage too tight. Wrap the bandage more loosely if part of your body away from the bandage is blue, swollen, cold, painful, or loses feeling (gets numb).  Take off the bandage and put it on again. Do this every 3-4 hours or as told by your doctor.  See your doctor if the bandage seems to make your problems worse.  Elevation Elevation means keeping the injured area raised. If you can, raise the injured area above your heart or the  center of your chest. Contact a doctor if:  You keep having pain and swelling.  Your symptoms get worse. Get help right away if:  You have sudden bad pain at your injury or lower than your injury.  You have redness or more swelling around your injury.  You have tingling or numbness at your injury or lower than your injury, and it does not go away when you take off the bandage. Summary  Many injuries can be cared for using rest, ice, compression, and elevation (RICE therapy).  You can go back to your normal activities when you feel okay and your doctor says it is okay.  Put ice on the injured area as told by your doctor.  Get help if your symptoms get worse or if you keep having pain and swelling. This information is not intended to replace advice given to you by your health care provider. Make sure you discuss any questions you have with your health care provider. Document Revised: 05/27/2017 Document Reviewed: 05/27/2017 Elsevier Patient Education  [place].

## 2020-05-29 NOTE — Progress Notes (Signed)
Patient ID: Carolyn Campbell, female    DOB: January 03, 1956, 64 y.o.   MRN: [id number]  PCP: Delsa Grana, PA-C  Chief Complaint  Patient presents with  . Foot Pain    bottom of left foot for 2 weeks  . Allergic Reaction    to elctrode tape from holter monitor    Subjective:   Carolyn Campbell is a 64 y.o. female, presents to clinic with CC of the following:left foot pain for 2 weeks. Bottom of foot painful and hard to walk.  Allergic reaction to Echocardiogram electrodes. Dark patches left on chest  Pt presents for left medial ankle and medial foot pain, onset over a month ago, constant pain, with some mild associated swelling, occurred after she started walking, she has since stopped walking and is doing other non-weight bearing activity.  Still painful to walk on, limping some.    Ankle Pain  Incident onset: onset over a month ago, gradual onset. There was no injury mechanism. Pain location: to left inner foot and left medial ankle. The quality of the pain is described as aching. The pain is moderate. Pertinent negatives include no numbness or tingling. Associated symptoms comments: Swelling. She reports no foreign bodies present. The symptoms are aggravated by weight bearing, palpation and movement. Treatments tried: OTC meds, and ankle sleeve. The treatment provided mild relief.  Rash This is a recurrent problem. The current episode started more than 1 month ago. Location: chest. Rash characteristics: irritation after leads or holter monitor adhesive, rash followed by hyperpigmentation. Past treatments include nothing.      Patient Active Problem List   Diagnosis Date Noted  . Varicose veins of both lower extremities with pain 07/19/2018  . Arthritis of right hand 12/21/2017  . Immunosuppressed status ([place]) 12/21/2017  . Major depression in remission ([place]) 12/21/2017  . Fibrocystic breast changes 06/22/2017  . Osteopenia 10/18/2016  . Atherosclerosis of aorta ([place]) 12/19/2015  .  Vitreous degeneration 05/21/2015  . Bad memory 05/21/2015  . Glaucoma associated with chamber angle anomalies 05/21/2015  . Dermatitis, eczematoid 05/21/2015  . CD (Crohn's disease) ([place]) 05/21/2015  . H/O malignant neoplasm of breast 05/21/2015  . Crohn's disease of both small and large intestine with rectal bleeding ([place]) 12/04/2014  . Solitary pulmonary nodule 11/07/2012  . 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 Cognitive deficits as late effect of cerebrovascular disease 01/20/2010  . CVA, old, hemiparesis ([place]) 04/28/2009  . Adult hypothyroidism 06/12/2008  . Acid reflux 05/23/2007  . Hypercholesterolemia without hypertriglyceridemia 01/18/2007  . Benign essential HTN 01/18/2007      Current Outpatient Medications:  .  albuterol (VENTOLIN HFA) 108 (90 Base) MCG/ACT inhaler, Inhale 2 puffs into the lungs every 6 (six) hours as needed for wheezing or shortness of breath., Disp: 18 g, Rfl: 0 .  alendronate (FOSAMAX) 70 MG tablet, TAKE 1 TABLET BY MOUTH ONCE A WEEK. TAKE WITH A FULL  GLASS OF WATER ON AN EMPTY  STOMACH., Disp: 12 tablet, Rfl: 3 .  latanoprost (XALATAN) 0.005 % ophthalmic solution, Place 1 drop into both eyes at bedtime. , Disp: , Rfl:  .  levothyroxine (SYNTHROID) 50 MCG tablet, Take 1 tablet (50 mcg total) by mouth daily. On empty stomach, Disp: 90 tablet, Rfl: 3 .  losartan-hydrochlorothiazide (HYZAAR) 50-12.5 MG tablet, Take 0.5 tablets by mouth daily. D/c HCTZ, Disp: 45 tablet, Rfl: 3 .  Multiple Vitamin (MULTIVITAMIN) tablet, Take 1 tablet by mouth daily., Disp: , Rfl:  .  omeprazole (PRILOSEC) 40 MG capsule, Take 1 capsule (40 mg total) by mouth daily before breakfast., Disp: 90 capsule, Rfl: 0 .  rosuvastatin (CRESTOR) 20 MG tablet, Take 1 tablet (20 mg total) by mouth at bedtime., Disp: 90 tablet, Rfl: 3 .  sertraline (ZOLOFT) 25 MG tablet, Taper dose - take 50 mg po every other day and 25 mg po every over day for 2 weeks, then take 25  mg po daily, Disp: 97 tablet, Rfl: 0 .  Sodium Sulfate-Mag Sulfate-KCl (SUTAB) [phone] MG TABS, Take 12 tablets by mouth in the morning and at bedtime. At 5pm take 12 tablets and then 5 hours prior to colonoscopy take the other 12., Disp: 24 tablet, Rfl: 0 .  triamcinolone (KENALOG) 0.025 % ointment, Apply 1 application topically 2 (two) times daily., Disp: 30 g, Rfl: 0 .  valACYclovir (VALTREX) 1000 MG tablet, Take 1 tablet (1,000 mg total) by mouth 2 (two) times daily., Disp: 30 tablet, Rfl: 5 .  vedolizumab (ENTYVIO) 300 MG injection, Inject into the vein., Disp: , Rfl:  .  brimonidine (ALPHAGAN) 0.15 % ophthalmic solution, 1 drop 3 (three) times daily., Disp: , Rfl:  .  brimonidine (ALPHAGAN) 0.2 % ophthalmic solution, 2 (two) times daily. (Patient not taking: Reported on 05/29/2020), Disp: , Rfl:  .  diclofenac sodium (VOLTAREN) 1 % GEL, Apply 2 g topically 4 (four) times daily. (Patient not taking: Reported on 05/29/2020), Disp: 100 g, Rfl: 0 .  DUREZOL 0.05 % EMUL, Place 1 drop into the left eye 3 (three) times daily., Disp: , Rfl:  .  gabapentin (NEURONTIN) 300 MG capsule, Take 1 capsule (300 mg total) by mouth at bedtime. (Patient not taking: Reported on 05/29/2020), Disp: 90 capsule, Rfl: 0 .  ketorolac (ACULAR) 0.5 % ophthalmic solution, Place 1 drop into the left eye 4 (four) times daily., Disp: , Rfl:  .  triamcinolone ointment (KENALOG) 0.1 %, Apply to affected skin one to two times daily for up to two weeks, Disp: 30 g, Rfl: 0   No Known Allergies   Social History   Tobacco Use  . Smoking status: Never Smoker  . Smokeless tobacco: Never Used  Vaping Use  . Vaping Use: Never used  Substance Use Topics  . Alcohol use: No    Alcohol/week: 0.0 standard drinks  . Drug use: No      Chart Review Today: I personally reviewed active problem list, medication list, allergies, family history, social history, health maintenance, notes from last encounter, lab results, imaging with  the patient/caregiver today.   Review of Systems  Constitutional: Negative.   HENT: Negative.   Eyes: Negative.   Respiratory: Negative.   Cardiovascular: Negative.   Gastrointestinal: Negative.   Endocrine: Negative.   Genitourinary: Negative.   Musculoskeletal: Negative.   Skin: Positive for color change. Negative for wound.  Allergic/Immunologic: Negative.   Neurological: Negative.  Negative for tingling and numbness.  Hematological: Negative.   Psychiatric/Behavioral: Negative.   All other systems reviewed and are negative.      Objective:   Vitals:   05/29/20 0910  BP: 124/76  Pulse: 88  Resp: 16  Temp: 97.9 F (36.6 C)  TempSrc: Oral  SpO2: 99%  Weight: 147 lb (66.7 kg)  Height: 5' (1.524 m)    Body mass index is 28.71 kg/m.  Physical Exam Vitals and nursing note reviewed.  Constitutional:      General: She is not in acute distress.    Appearance: Normal appearance. She is  well-developed. She is not toxic-appearing or diaphoretic.  HENT:     Head: Normocephalic and atraumatic.  Eyes:     General:        Right eye: No discharge.        Left eye: No discharge.     Conjunctiva/sclera: Conjunctivae normal.  Neck:     Trachea: No tracheal deviation.  Cardiovascular:     Rate and Rhythm: Normal rate and regular rhythm.     Pulses:          Dorsalis pedis pulses are 2+ on the left side.       Posterior tibial pulses are 2+ on the left side.  Pulmonary:     Effort: Pulmonary effort is normal. No respiratory distress.     Breath sounds: No stridor.  Musculoskeletal:        General: Normal range of motion.     Left ankle: Swelling (very mild) present. No deformity, ecchymosis or lacerations. Tenderness present over the medial malleolus. No base of 5th metatarsal or proximal fibula tenderness. Normal range of motion. Normal pulse.     Left Achilles Tendon: Normal.  Feet:     Right foot:     Toenail Condition: Right toenails are normal.     Left foot:      Skin integrity: No ulcer, blister, erythema or warmth.     Toenail Condition: Left toenails are normal.     Comments: Left foot/ankle  Mild pain with ROM testing but grossly normal No erythema  Skin:    General: Skin is warm and dry.     Comments: Flat hyperpigmentation to chest over various areas in different shapes  Neurological:     Mental Status: She is alert.     Motor: No abnormal muscle tone.     Coordination: Coordination normal.     Gait: Gait abnormal (antalgic gait).  Psychiatric:        Mood and Affect: Mood normal.        Behavior: Behavior normal.      Results for orders placed or performed in visit on 03/04/20  CBC with Differential/Platelet  Result Value Ref Range   WBC 5.3 3.8 - 10.8 Thousand/uL   RBC 4.33 3.80 - 5.10 Million/uL   Hemoglobin 12.8 11.7 - 15.5 g/dL   HCT 38.8 35 - 45 %   MCV 89.6 80.0 - 100.0 fL   MCH 29.6 27.0 - 33.0 pg   MCHC 33.0 32.0 - 36.0 g/dL   RDW 12.8 11.0 - 15.0 %   Platelets 215 140 - 400 Thousand/uL   MPV 10.8 7.5 - 12.5 fL   Neutro Abs 2,740 1,500 - 7,800 cells/uL   Lymphs Abs 1,972 850 - 3,900 cells/uL   Absolute Monocytes 360 200 - 950 cells/uL   Eosinophils Absolute 159 15 - 500 cells/uL   Basophils Absolute 69 0 - 200 cells/uL   Neutrophils Relative % 51.7 %   Total Lymphocyte 37.2 %   Monocytes Relative 6.8 %   Eosinophils Relative 3.0 %   Basophils Relative 1.3 %  COMPLETE METABOLIC PANEL WITH GFR  Result Value Ref Range   Glucose, Bld 111 (H) 65 - 99 mg/dL   BUN 15 7 - 25 mg/dL   Creat 0.89 0.50 - 0.99 mg/dL   GFR, Est Non African American 68 > OR = 60 mL/min/1.68m   GFR, Est African American 78 > OR = 60 mL/min/1.773m  BUN/Creatinine Ratio NOT APPLICABLE 6 - 22 (calc)  Sodium 141 135 - 146 mmol/L   Potassium 4.0 3.5 - 5.3 mmol/L   Chloride 102 98 - 110 mmol/L   CO2 30 20 - 32 mmol/L   Calcium 10.2 8.6 - 10.4 mg/dL   Total Protein 7.3 6.1 - 8.1 g/dL   Albumin 4.4 3.6 - 5.1 g/dL   Globulin 2.9 1.9 - 3.7  g/dL (calc)   AG Ratio 1.5 1.0 - 2.5 (calc)   Total Bilirubin 0.6 0.2 - 1.2 mg/dL   Alkaline phosphatase (APISO) 84 37 - 153 U/L   AST 16 10 - 35 U/L   ALT 18 6 - 29 U/L  Lipid panel  Result Value Ref Range   Cholesterol 222 (H) <200 mg/dL   HDL 96 > OR = 50 mg/dL   Triglycerides 74 <150 mg/dL   LDL Cholesterol (Calc) 110 (H) mg/dL (calc)   Total CHOL/HDL Ratio 2.3 <5.0 (calc)   Non-HDL Cholesterol (Calc) 126 <130 mg/dL (calc)  TSH  Result Value Ref Range   TSH 1.10 0.40 - 4.50 mIU/L       Assessment & Plan:   1. Left foot pain Tender to medial foot to prox 1st metatarsal and medial tarsal bones and inferior and anterior to left medial malleolus, mild swelling, no bony ttp, no deformity noted Xrays today Conservative tx with ice, rest, immobilization with ankle ASO or other more bulky brace, elevate, trial of NSAIDs and see if another couple weeks improves sx.  May be overuse injury? Strain?   Offered to refer to podiatrist at pt's request  - DG Foot Complete Left; Future - DG Ankle Complete Left; Future - naproxen (NAPROSYN) 500 MG tablet; Take 1 tablet (500 mg total) by mouth 2 (two) times daily with a meal.  Dispense: 30 tablet; Refill: 1  2. Acute left ankle pain See above - DG Foot Complete Left; Future - DG Ankle Complete Left; Future - naproxen (NAPROSYN) 500 MG tablet; Take 1 tablet (500 mg total) by mouth 2 (two) times daily with a meal.  Dispense: 30 tablet; Refill: 1  3. Hyperpigmentation Pt seems to be sensitive to adhesive - she was not able to explain what her skin looks like when he removes adhesive initially, but she has multple areas of hyperpigmentation to her chest after different cardiac testing - trial of triamcinolone ointment to affected areas for 1-2 weeks, avoid strong adhesives Offered to put in dermatology referral if desired - I suspect some scarring skin changes after local dermatitis?' Avoid exposure to sun light, some steroids for 1 - 2 weeks,  likely over 3-12 months it will lighten. - triamcinolone ointment (KENALOG) 0.1 %; Apply to affected skin one to two times daily for up to two weeks  Dispense: 30 g; Refill: 0  4. Need for influenza vaccination  - Flu Vaccine QUAD High Dose(Fluad)  Plan and f/up reviewed with pt, printed for her in AVS.  Enouraged her to let us know if she wanted either referral.  Wrote out for pt various braces that I think would help with pain and immobilization If she does not improve over the next 2 weeks or so I do want her to see podiatry for further eval.   Delsa Grana, PA-C 05/29/20 9:39 AM    2:18 PM Xray images reviewed by me today/now, no obvious fx, several areas of spurring noted to heel and ankle Pending radiology report

## 2020-06-10 ENCOUNTER — Telehealth: Payer: Self-pay

## 2020-06-10 DIAGNOSIS — M25572 Pain in left ankle and joints of left foot: Secondary | ICD-10-CM

## 2020-06-10 NOTE — Telephone Encounter (Signed)
Copied from [place] [id number]. Topic: Referral - Request for Referral >> Jun 10, 2020  2:57 PM Lennox Solders wrote: Has patient seen PCP for this complaint? Yes. Pt is still having left foot pain. Pt has [facility] complete primary. Pt said leisa told her if she is still having issue with foot she would referral her to podiatry

## 2020-06-16 ENCOUNTER — Encounter: Payer: Self-pay | Admitting: Family Medicine

## 2020-06-19 ENCOUNTER — Ambulatory Visit: Payer: Self-pay | Admitting: *Deleted

## 2020-06-19 NOTE — Telephone Encounter (Signed)
Rolled out of bed onto left elbow 3 days ago. Has full range of motion with the elbow and arm. Tenderness at the elbow when touched continues. No swelling that she can see.Care Advice including ice pack to elbow several times today and tomorrow. Avoid the elbow touching anything for several days.   Reason for Disposition . [1] Bumped elbow AND [2] brief (now gone) numbness (or tingling or burning) in hand and fingers  Protocols used: ELBOW INJURY-A-AH

## 2020-06-23 ENCOUNTER — Encounter: Payer: Self-pay | Admitting: Podiatry

## 2020-06-23 ENCOUNTER — Telehealth: Payer: Self-pay | Admitting: Gastroenterology

## 2020-06-23 ENCOUNTER — Ambulatory Visit (INDEPENDENT_AMBULATORY_CARE_PROVIDER_SITE_OTHER): Payer: Medicare Other | Admitting: Podiatry

## 2020-06-23 ENCOUNTER — Other Ambulatory Visit: Payer: Self-pay

## 2020-06-23 DIAGNOSIS — M722 Plantar fascial fibromatosis: Secondary | ICD-10-CM

## 2020-06-23 DIAGNOSIS — M76822 Posterior tibial tendinitis, left leg: Secondary | ICD-10-CM | POA: Diagnosis not present

## 2020-06-23 DIAGNOSIS — K5010 Crohn's disease of large intestine without complications: Secondary | ICD-10-CM

## 2020-06-23 NOTE — Patient Instructions (Addendum)
Look for Voltaren gel at the pharmacy over the counter or online (also known as diclofenac 1% gel). Apply to the painful areas 3-4x daily with the supplied dosing card. Allow to dry for 10 minutes before going into socks/shoes   Posterior Tibial Tendinitis Posterior tibial tendinitis is irritation of a tendon called the posterior tibial tendon. Your posterior tibial tendon is a cord-like tissue that connects bones of your lower leg and foot to a muscle that:  Supports your arch.  Helps you raise up on your toes.  Helps you turn your foot down and in. This condition causes foot and ankle pain. It can also lead to a flat foot. What are the causes? This condition is most often caused by repeated stress to the tendon (overuse injury). It can also be caused by a sudden injury that stresses the tendon, such as landing on your foot after jumping or falling. What increases the risk? This condition is more likely to develop in:  People who play a sport that involves putting a lot of pressure on the feet, such as: ? Basketball. ? Tennis. ? Soccer. ? Hockey.  Runners.  Females who are older than 66 years of age and are overweight.  People with diabetes.  People with decreased foot stability.  People with flat feet. What are the signs or symptoms? Symptoms include:  Pain in the inner ankle.  Pain at the arch of your foot.  Pain that gets worse with running, walking, or standing.  Swelling on the inside of your ankle and foot.  Weakness in your ankle or foot.  Inability to stand up on tiptoe.  Flattening of the arch of your foot. How is this diagnosed? This condition may be diagnosed based on:  Your symptoms.  Your medical history.  A physical exam.  Tests, such as: ? X-ray. ? MRI. ? Ultrasound. How is this treated? This condition may be treated by:  Putting ice to the injured area.  Taking NSAIDs, such as ibuprofen, to reduce pain and swelling.  Wearing a  special shoe or shoe insert to support your arch (orthotic).  Having physical therapy.  Replacing high-impact exercise with low-impact exercise, such as swimming or cycling. If your symptoms do not improve with these treatments, you may need to wear a splint, removable walking boot, or short leg cast for 6-8 weeks to keep your foot and ankle still (immobilized). Follow these instructions at home: If you have a cast, splint, or boot:  Keep it clean and dry.  Check the skin around it every day. Tell your health care provider about any concerns. If you have a cast:  Do not stick anything inside it to scratch your skin. Doing that increases your risk of infection.  You may put lotion on dry skin around the edges of the cast. Do not put lotion on the skin underneath the cast. If you have a splint or boot:  Wear it as told by your health care provider. Remove it only as told by your health care provider.  Loosen it if your toes tingle, become numb, or turn cold and blue. Bathing  Do not take baths, swim, or use a hot tub until your health care provider approves. Ask your health care provider if you may take showers.  If your cast, splint, or boot is not waterproof: ? Do not let it get wet. ? Cover it with a waterproof covering while you take a bath or a shower. Managing pain and swelling  If directed, put ice on the injured area. ? If you have a removable splint or boot, remove it as told by your health care provider. ? Put ice in a plastic bag. ? Place a towel between your skin and the bag or between your cast and the bag. ? Leave the ice on for 20 minutes, 2-3 times a day.  Move your toes often to reduce stiffness and swelling.  Raise (elevate) the injured area above the level of your heart while you are sitting or lying down. Activity  Do not use the injured foot to support your body weight until your health care provider says that you can. Use crutches as told by your health  care provider.  Do not do activities that make pain or swelling worse.  Ask your health care provider when it is safe to drive if you have a cast, splint, or boot on your foot.  Return to your normal activities as told by your health care provider. Ask your health care provider what activities are safe for you.  Do exercises as told by your health care provider. General instructions  Take over-the-counter and prescription medicines only as told by your health care provider.  If you have an orthotic, use it as told by your health care provider.  Keep all follow-up visits as told by your health care provider. This is important. How is this prevented?  Wear footwear that is appropriate to your athletic activity.  Avoid athletic activities that cause pain or swelling in your ankle or foot.  Before being active, do range-of-motion and stretching exercises.  If you develop pain or swelling while training, stop training.  If you have pain or swelling that does not improve after a few days of rest, see your health care provider.  If you start a new athletic activity, start gradually so you can build up your strength and flexibility. Contact a health care provider if:  Your symptoms get worse.  Your symptoms do not improve in 6-8 weeks.  You develop new, unexplained symptoms.  Your splint, boot, or cast gets damaged. Summary  Posterior tibial tendinitis is irritation of a tendon called the posterior tibial tendon.  This condition is most often caused by repeated stress to the tendon (overuse injury).  This condition causes foot pain and ankle pain. It can also lead to a flat foot.  This condition may be treated by not doing high-impact activities, applying ice, having physical therapy, wearing orthotics, and wearing a cast, splint, or boot if needed. This information is not intended to replace advice given to you by your health care provider. Make sure you discuss any questions  you have with your health care provider. Document Revised: 01/02/2019 Document Reviewed: 11/09/2018 Elsevier Patient Education  [place].  Posterior Tibial Tendinitis Rehab Ask your health care provider which exercises are safe for you. Do exercises exactly as told by your health care provider and adjust them as directed. It is normal to feel mild stretching, pulling, tightness, or discomfort as you do these exercises. Stop right away if you feel sudden pain or your pain gets worse. Do not begin these exercises until told by your health care provider. Stretching and range-of-motion exercises These exercises warm up your muscles and joints and improve the movement and flexibility in your ankle and foot. These exercises may also help to relieve pain. Standing wall calf stretch, knee straight  1. Stand with your hands against a wall. 2. Extend your left /  right leg behind you, and bend your front knee slightly. If directed, place a folded washcloth under the arch of your foot for support. 3. Point the toes of your back foot slightly inward. 4. Keeping your heels on the floor and your back knee straight, shift your weight toward the wall. Do not allow your back to arch. You should feel a gentle stretch in your upper left / right calf. 5. Hold this position for 10 seconds. Repeat 10 times. Complete this exercise 2 times a day. Standing wall calf stretch, knee bent 1. Stand with your hands against a wall. 2. Extend your left / right leg behind you, and bend your front knee slightly. If directed, place a folded washcloth under the arch of your foot for support. 3. Point the toes of your back foot slightly inward. 4. Unlock your back knee so it is bent. Keep your heels on the floor. You should feel a gentle stretch deep in your lower left / right calf. 5. Hold this position for 10 seconds. Repeat 10 times. Complete this exercise 2 times a day. Strengthening exercises These exercises build  strength and endurance in your ankle and foot. Endurance is the ability to use your muscles for a long time, even after they get tired. Ankle inversion with band 1. Secure one end of a rubber exercise band or tubing to a fixed object, such as a table leg or a pole, that will stay still when the band is pulled. 2. Loop the other end of the band around the middle of your left / right foot. 3. Sit on the floor facing the object with your left / right leg extended. The band or tube should be slightly tense when your foot is relaxed. 4. Leading with your big toe, slowly bring your left / right foot and ankle inward, toward your other foot (inversion). 5. Hold this position for 10 seconds. 6. Slowly return your foot to the starting position. Repeat 10 times. Complete this exercise 2 times a day. Towel curls  1. Sit in a chair on a non-carpeted surface, and put your feet on the floor. 2. Place a towel in front of your feet. 3. Keeping your heel on the floor, put your left / right foot on the towel. 4. Pull the towel toward you by grabbing the towel with your toes and curling them under. Keep your heel on the floor while you do this. 5. Let your toes relax. 6. Grab the towel with your toes again. Keep going until the towel is completely underneath your foot. Repeat 10 times. Complete this exercise 2 times a day. Balance exercise This exercise improves or maintains your balance. Balance is important in preventing falls. Single leg stand 1. Without wearing shoes, stand near a railing or in a doorway. You may hold on to the railing or door frame as needed for balance. 2. Stand on your left / right foot. Keep your big toe down on the floor and try to keep your arch lifted. ? If balancing in this position is too easy, try the exercise with your eyes closed or while standing on a pillow. 3. Hold this position for 10 seconds. Repeat 10 times. Complete this exercise 2 times a day. This information is not  intended to replace advice given to you by your health care provider. Make sure you discuss any questions you have with your health care provider. Document Revised: 01/02/2019 Document Reviewed: 11/08/2018 Elsevier Patient Education  [place].

## 2020-06-23 NOTE — Telephone Encounter (Signed)
Patient called to see about getting procedure scheduled. Clinical staff was informed.

## 2020-06-23 NOTE — Telephone Encounter (Signed)
Called patient and scheduled patient for 07/16/2020 with Dr. Marius Ditch. Informed patient when to go for COVID test. Patient states she has Sutab at home. Went over instructions with patient and sent to [place] and mailed

## 2020-06-23 NOTE — Progress Notes (Signed)
  Subjective:  Patient ID: Carolyn Campbell, female    DOB: 17-Dec-1955,  MRN: [id number]  Chief Complaint  Patient presents with  . Foot Pain    Patient presents today for left medial foot/arch pain x 2 months.  She says "it gets irritated and swells.  It feels like a nagging ache and it comes and goes.  its worse sometimes when I put pressure on my foot"    64 y.o. female presents with the above complaint. History confirmed with patient.  She is tried using over-the-counter lace up ankle brace which has not really helped that much.  She is not sure if it fits her correctly.  Her PCP also sent her for nonweightbearing x-rays.  Aleve has helped some but she cannot take a lot of this or aspirin due to her Crohn's disease.  Objective:  Physical Exam: warm, good capillary refill, no trophic changes or ulcerative lesions, normal DP and PT pulses and normal sensory exam. Left Foot: Pain on palpation to the posterior tibial tendon at the retromalleolar area as well as the insertion and with resisted inversion.  She also has pain on palpation to the medial calcaneal tubercle at the insertion of the plantar fascia, none in the mid substance.  Radiographs: X-ray of left foot previously taken reviewed: Nonweightbearing exam small plantar calcaneal enthesophyte but is noted Assessment:   1. Posterior tibial tendinitis of left lower extremity   2. Plantar fasciitis, left      Plan:  Patient was evaluated and treated and all questions answered.  Discussed the etiology and treatment options for plantar fasciitis and posterior tibial tendinitis including stretching, formal physical therapy, supportive shoegears such as a running shoe or sneaker, pre fabricated orthoses, injection therapy, and oral medications. We also discussed the role of surgical treatment of this for patients who do not improve after exhausting non-surgical treatment options.  I believe that the posterior tibial tendinitis is the primary  issue at this time.    -XR reviewed with patient -Educated patient on stretching and icing of the affected limb -Plantar fascial brace dispensed  -Recommended she use Voltaren 3-4 times daily.  Prefer this over using consistent dosing of oral NSAIDs due to her history of Crohn's disease.  She may continue to take Aleve as tolerated -We also discussed shoe gear and orthoses.  She has orthoses at home but is not sure which ones are the best ones for her.  I recommend that she bring these with her that she exercises in to evaluate these at next visit.   Return in about 6 weeks (around 08/04/2020) for re-check tendinitis.

## 2020-07-03 ENCOUNTER — Ambulatory Visit: Payer: Self-pay | Admitting: Family Medicine

## 2020-07-07 ENCOUNTER — Other Ambulatory Visit: Payer: Self-pay | Admitting: Family Medicine

## 2020-07-07 ENCOUNTER — Other Ambulatory Visit: Payer: Self-pay | Admitting: Gastroenterology

## 2020-07-07 DIAGNOSIS — F325 Major depressive disorder, single episode, in full remission: Secondary | ICD-10-CM

## 2020-07-07 MED ORDER — OMEPRAZOLE 40 MG PO CPDR
40.0000 mg | DELAYED_RELEASE_CAPSULE | Freq: Every day | ORAL | 0 refills | Status: DC
Start: 2020-07-07 — End: 2020-11-04

## 2020-07-07 NOTE — Telephone Encounter (Signed)
Last office visit 01/07/2020 crohn's disease  Last refill 03/19/2020 0 refills  No appointment is scheduled

## 2020-07-14 ENCOUNTER — Other Ambulatory Visit: Payer: Self-pay

## 2020-07-14 ENCOUNTER — Other Ambulatory Visit
Admission: RE | Admit: 2020-07-14 | Discharge: 2020-07-14 | Disposition: A | Discharge status: Home / Self Care | Payer: Medicare Other | Source: Ambulatory Visit | Attending: Gastroenterology | Admitting: Gastroenterology

## 2020-07-14 DIAGNOSIS — Z01812 Encounter for preprocedural laboratory examination: Secondary | ICD-10-CM | POA: Insufficient documentation

## 2020-07-14 DIAGNOSIS — Z20822 Contact with and (suspected) exposure to covid-19: Secondary | ICD-10-CM | POA: Diagnosis not present

## 2020-07-14 LAB — SARS CORONAVIRUS 2 (TAT 6-24 HRS): SARS Coronavirus 2: NEGATIVE

## 2020-07-15 MED ORDER — SERTRALINE HCL 25 MG PO TABS: 25.0000 mg | ORAL_TABLET | Freq: Every day | ORAL | 3 refills | Status: DC

## 2020-07-16 ENCOUNTER — Ambulatory Visit: Payer: Medicare Other | Admitting: Certified Registered Nurse Anesthetist

## 2020-07-16 ENCOUNTER — Encounter: Payer: Self-pay | Admitting: Gastroenterology

## 2020-07-16 ENCOUNTER — Other Ambulatory Visit: Payer: Self-pay

## 2020-07-16 ENCOUNTER — Encounter
Admission: RE | Disposition: A | Discharge status: Home / Self Care | Payer: Self-pay | Source: Ambulatory Visit | Attending: Gastroenterology

## 2020-07-16 ENCOUNTER — Ambulatory Visit
Admission: RE | Admit: 2020-07-16 | Discharge: 2020-07-16 | Disposition: A | Discharge status: Home / Self Care | Payer: Medicare Other | Source: Ambulatory Visit | Attending: Gastroenterology | Admitting: Gastroenterology

## 2020-07-16 DIAGNOSIS — K5010 Crohn's disease of large intestine without complications: Secondary | ICD-10-CM | POA: Diagnosis present

## 2020-07-16 DIAGNOSIS — K635 Polyp of colon: Secondary | ICD-10-CM | POA: Insufficient documentation

## 2020-07-16 HISTORY — PX: COLONOSCOPY WITH PROPOFOL: SHX5780

## 2020-07-16 SURGERY — COLONOSCOPY WITH PROPOFOL
Anesthesia: General

## 2020-07-16 MED ORDER — PROPOFOL 500 MG/50ML IV EMUL
INTRAVENOUS | Status: DC | PRN
  Administered 2020-07-16: 160 ug/kg/min via INTRAVENOUS

## 2020-07-16 MED ORDER — SODIUM CHLORIDE 0.9 % IV SOLN: INTRAVENOUS | Status: DC

## 2020-07-16 MED ORDER — PROPOFOL 10 MG/ML IV BOLUS
INTRAVENOUS | Status: DC | PRN
  Administered 2020-07-16: 50 mg via INTRAVENOUS

## 2020-07-16 MED ORDER — PHENYLEPHRINE HCL (PRESSORS) 10 MG/ML IV SOLN
INTRAVENOUS | Status: DC | PRN
  Administered 2020-07-16 (×2): 100 ug via INTRAVENOUS

## 2020-07-16 NOTE — H&P (Signed)
Carolyn Darby, MD [address]  [place], [place] [address]  Main: [phone]  Fax: [phone] Pager: [phone]  Primary Care Physician:  Delsa Grana, PA-C Primary Gastroenterologist:  Dr. Cephas Campbell  Pre-Procedure History & Physical: HPI:  Carolyn Campbell is a 64 y.o. female is here for an colonoscopy.   Past Medical History:  Diagnosis Date  . Abdominal pain, epigastric   . Allergic rhinitis   . Anginal pain ([place])    states MD said it was GERD  . Arthritis   . Asthma   . Breast cancer ([place]) 2006   LT LUMPECTOMY  . CD (Crohn's disease) ([place]) 05/21/2015   FOLLOWED BY GI   . Clotting disorder ([place])   . Cognitive deficits as late effect of cerebrovascular disease   . COPD (chronic obstructive pulmonary disease) ([place])   . Crohn's disease of both small and large intestine with rectal bleeding ([place]) 12/04/2014  . Depression    Currently taking zoloft.  . 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 Dyspnea   . Esophageal reflux   . Fever blister 07/19/2018  . Glaucoma    vitreous degeneration  . History of kidney stones   . Hyperlipidemia   . Hypertension   . Hypothyroidism   . Occlusion, cerebral artery    NOS w/infarction  . Osteoporosis   . Personal history of radiation therapy 2006   BREAST CA  . Stroke ([place])   . Ulcer     Past Surgical History:  Procedure Laterality Date  . BREAST BIOPSY Left 2006   POS  . BREAST LUMPECTOMY Left 09/20/2004   positive  . BREAST SURGERY Left    malignant biopsy  . CARDIAC CATHETERIZATION    . COLONOSCOPY  2015  . COLONOSCOPY WITH PROPOFOL N/A 06/08/2017   Procedure: COLONOSCOPY WITH PROPOFOL;  Surgeon: Lin Landsman, MD;  Location: [facility];  Service: Gastroenterology;  Laterality: N/A;  . COLONOSCOPY WITH PROPOFOL N/A 03/08/2019   Procedure: COLONOSCOPY WITH PROPOFOL;  Surgeon: Lin Landsman, MD;  Location: [facility] ENDOSCOPY;  Service: Gastroenterology;  Laterality:  N/A;  . ESOPHAGOGASTRODUODENOSCOPY  2015  . ESOPHAGOGASTRODUODENOSCOPY (EGD) WITH PROPOFOL N/A 06/08/2017   Procedure: ESOPHAGOGASTRODUODENOSCOPY (EGD) WITH PROPOFOL;  Surgeon: Lin Landsman, MD;  Location: [place];  Service: Gastroenterology;  Laterality: N/A;  . ESOPHAGOGASTRODUODENOSCOPY (EGD) WITH PROPOFOL N/A 03/08/2019   Procedure: ESOPHAGOGASTRODUODENOSCOPY (EGD) WITH PROPOFOL;  Surgeon: Lin Landsman, MD;  Location: [facility] ENDOSCOPY;  Service: Gastroenterology;  Laterality: N/A;  . FINGER SURGERY     Right small finger  . FRACTURE SURGERY    . [place] STUDY  2015  . TUBAL LIGATION      Prior to Admission medications   Medication Sig Start Date End Date Taking? Authorizing Provider  albuterol (VENTOLIN HFA) 108 (90 Base) MCG/ACT inhaler Inhale 2 puffs into the lungs every 6 (six) hours as needed for wheezing or shortness of breath. 11/08/19   Steele Sizer, MD  alendronate (FOSAMAX) 70 MG tablet TAKE 1 TABLET BY MOUTH ONCE A WEEK. TAKE WITH A FULL  GLASS OF WATER ON AN EMPTY  STOMACH. 11/01/19   Ancil Boozer, Drue Stager, MD  brimonidine (ALPHAGAN) 0.15 % ophthalmic solution 1 drop 3 (three) times daily. 12/03/19   [provider]  diclofenac sodium (VOLTAREN) 1 % GEL Apply 2 g topically 4 (four) times daily. Patient not taking: Reported on 05/29/2020 05/18/18   Hubbard Hartshorn, FNP  DUREZOL 0.05 %  EMUL Place 1 drop into the left eye 3 (three) times daily. 12/05/19   [provider]  hydrochlorothiazide (HYDRODIURIL) 12.5 MG tablet Take by mouth.    [provider]  latanoprost (XALATAN) 0.005 % ophthalmic solution Place 1 drop into both eyes at bedtime.  04/22/16   [provider]  levothyroxine (SYNTHROID) 50 MCG tablet Take 1 tablet (50 mcg total) by mouth daily. On empty stomach 03/06/20   Delsa Grana, PA-C  losartan-hydrochlorothiazide (HYZAAR) 50-12.5 MG tablet Take 0.5 tablets by mouth daily. D/c HCTZ 03/06/20   Delsa Grana, PA-C    Multiple Vitamin (MULTIVITAMIN) tablet Take 1 tablet by mouth daily.    [provider]  naproxen (NAPROSYN) 500 MG tablet Take 1 tablet (500 mg total) by mouth 2 (two) times daily with a meal. 05/29/20   Delsa Grana, PA-C  omeprazole (PRILOSEC) 40 MG capsule Take 1 capsule (40 mg total) by mouth daily before breakfast. 07/07/20 10/05/20  Hinda Lindor, Tally Due, MD  PROLENSA 0.07 % SOLN SMARTSIG:1 Drop(s) Left Eye Every Evening 01/25/20   [provider]  rosuvastatin (CRESTOR) 20 MG tablet Take 1 tablet (20 mg total) by mouth at bedtime. 03/06/20   Delsa Grana, PA-C  sertraline (ZOLOFT) 25 MG tablet Take 1 tablet (25 mg total) by mouth daily. Taper dose - take 50 mg po every other day and 25 mg po every over day for 2 weeks, then take 25 mg po daily 07/15/20   Delsa Grana, PA-C  Sodium Sulfate-Mag Sulfate-KCl (SUTAB) [phone] MG TABS Take 12 tablets by mouth in the morning and at bedtime. At 5pm take 12 tablets and then 5 hours prior to colonoscopy take the other 12. 01/07/20   Welby Montminy, Tally Due, MD  triamcinolone ointment (KENALOG) 0.1 % Apply to affected skin one to two times daily for up to two weeks 05/29/20   Delsa Grana, PA-C  valACYclovir (VALTREX) 1000 MG tablet Take 1 tablet (1,000 mg total) by mouth 2 (two) times daily. 03/28/18   Poulose, Bethel Born, NP  vedolizumab (ENTYVIO) 300 MG injection Inject into the vein.    [provider]    Allergies as of 06/23/2020  . (No Known Allergies)    Family History  Problem Relation Age of Onset  . Heart disease Mother   . Heart attack Mother   . Breast cancer Sister   . Alcohol abuse Father   . Cerebrovascular Accident Father   . Arthritis Father   . Hypertension Father   . Hypertension Other   . Diabetes Other   . Breast cancer Sister 33  . Heart attack Other 41  . Cancer Sister     Social History   Socioeconomic History  . Marital status: Married    Spouse name: Not on file  . Number of children: 2   . Years of education: Not on file  . Highest education level: Some college, no degree  Occupational History  . Not on file  Tobacco Use  . Smoking status: Never Smoker  . Smokeless tobacco: Never Used  Vaping Use  . Vaping Use: Never used  Substance and Sexual Activity  . Alcohol use: No    Alcohol/week: 0.0 standard drinks  . Drug use: No  . Sexual activity: Yes    Partners: Male    Birth control/protection: None  Other Topics Concern  . Not on file  Social History Narrative   Married. One son and one daughter. She is disabled after she had breast cancer and  strokes. 2 caffeinated beverages daily.   Social Determinants of Health   Financial Resource Strain: Low Risk   . Difficulty of Paying Living Expenses: Not hard at all  Food Insecurity: No Food Insecurity  . Worried About Charity fundraiser in the Last Year: Never true  . Ran Out of Food in the Last Year: Never true  Transportation Needs: No Transportation Needs  . Lack of Transportation (Medical): No  . Lack of Transportation (Non-Medical): No  Physical Activity: Insufficiently Active  . Days of Exercise per Week: 2 days  . Minutes of Exercise per Session: 30 min  Stress: No Stress Concern Present  . Feeling of Stress : Not at all  Social Connections: Moderately Integrated  . Frequency of Communication with Friends and Family: More than three times a week  . Frequency of Social Gatherings with Friends and Family: Three times a week  . Attends Religious Services: More than 4 times per year  . Active Member of Clubs or Organizations: No  . Attends Archivist Meetings: Never  . Marital Status: Married  Human resources officer Violence: Not At Risk  . Fear of Current or Ex-Partner: No  . Emotionally Abused: No  . Physically Abused: No  . Sexually Abused: No    Review of Systems: See HPI, otherwise negative ROS  Physical Exam: BP (!) 173/77   Pulse 80   Temp (!) 96.2 F (35.7 C) (Temporal)   Resp 14    Ht 5' (1.524 m)   Wt 65.8 kg   SpO2 100%   BMI 28.32 kg/m  General:   Alert,  pleasant and cooperative in NAD Head:  Normocephalic and atraumatic. Neck:  Supple; no masses or thyromegaly. Lungs:  Clear throughout to auscultation.    Heart:  Regular rate and rhythm. Abdomen:  Soft, nontender and nondistended. Normal bowel sounds, without guarding, and without rebound.   Neurologic:  Alert and  oriented x4;  grossly normal neurologically.  Impression/Plan: HILDAGARDE HOLLERAN is here for an colonoscopy to be performed for h/o crohn's colitis  Risks, benefits, limitations, and alternatives regarding  colonoscopy have been reviewed with the patient.  Questions have been answered.  All parties agreeable.   [name], MD  07/16/2020, 8:50 AM

## 2020-07-16 NOTE — Transfer of Care (Signed)
Immediate Anesthesia Transfer of Care Note  Patient: Carolyn Campbell  Procedure(s) Performed: COLONOSCOPY WITH PROPOFOL (N/A )  Patient Location: PACU  Anesthesia Type:General  Level of Consciousness: awake, alert  and oriented  Airway & Oxygen Therapy: Patient Spontanous Breathing  Post-op Assessment: Report given to RN and Post -op Vital signs reviewed and stable  Post vital signs: Reviewed and stable  Last Vitals:  Vitals Value Taken Time  BP 103/44 07/16/20 0921  Temp 36.7 C 07/16/20 0920  Pulse 76 07/16/20 0921  Resp 20 07/16/20 0921  SpO2 100 % 07/16/20 0921  Vitals shown include unvalidated device data.  Last Pain:  Vitals:   07/16/20 0920  TempSrc: Temporal  PainSc: 0-No pain         Complications: No complications documented.

## 2020-07-16 NOTE — Anesthesia Postprocedure Evaluation (Signed)
Anesthesia Post Note  Patient: Carolyn Campbell  Procedure(s) Performed: COLONOSCOPY WITH PROPOFOL (N/A )  Patient location during evaluation: Endoscopy Anesthesia Type: General Level of consciousness: awake and alert Pain management: pain level controlled Vital Signs Assessment: post-procedure vital signs reviewed and stable Respiratory status: spontaneous breathing, nonlabored ventilation, respiratory function stable and patient connected to nasal cannula oxygen Cardiovascular status: blood pressure returned to baseline and stable Postop Assessment: no apparent nausea or vomiting Anesthetic complications: no   No complications documented.   Last Vitals:  Vitals:   07/16/20 0930 07/16/20 0940  BP: 117/63 134/61  Pulse: 72   Resp: 12   Temp:    SpO2: 100%     Last Pain:  Vitals:   07/16/20 0950  TempSrc:   PainSc: 0-No pain                 Arita Miss

## 2020-07-16 NOTE — Anesthesia Preprocedure Evaluation (Signed)
Anesthesia Evaluation  Patient identified by MRN, date of birth, ID band Patient awake    Reviewed: Allergy & Precautions, NPO status , Patient's Chart, lab work & pertinent test results  History of Anesthesia Complications Negative for: history of anesthetic complications  Airway Mallampati: III  TM Distance: >3 FB Neck ROM: Full    Dental  (+) Missing   Pulmonary asthma , neg sleep apnea, COPD,  COPD inhaler,    breath sounds clear to auscultation- rhonchi (-) wheezing      Cardiovascular hypertension, Pt. on medications (-) CAD, (-) Past MI, (-) Cardiac Stents and (-) CABG  Rhythm:Regular Rate:Normal - Systolic murmurs and - Diastolic murmurs    Neuro/Psych neg Seizures PSYCHIATRIC DISORDERS Depression CVA (L sided weakness), Residual Symptoms    GI/Hepatic Neg liver ROS, GERD  ,  Endo/Other  neg diabetesHypothyroidism   Renal/GU negative Renal ROS     Musculoskeletal  (+) Arthritis ,   Abdominal (+) - obese,   Peds  Hematology negative hematology ROS (+)   Anesthesia Other Findings Past Medical History: No date: Abdominal pain, epigastric No date: Allergic rhinitis No date: Anginal pain ([place])     Comment:  states MD said it was GERD No date: Arthritis No date: Asthma 2006: Breast cancer ([place])     Comment:  LT LUMPECTOMY 05/21/2015: CD (Crohn's disease) ([place])     Comment:  FOLLOWED BY GI  No date: Clotting disorder ([place]) No date: Cognitive deficits as late effect of cerebrovascular disease No date: COPD (chronic obstructive pulmonary disease) ([place]) 12/04/2014: Crohn's disease of both small and large intestine with  rectal bleeding ([place]) No date: Depression     Comment:  Currently taking zoloft. 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 No date: Dyspnea No date: Esophageal reflux 07/19/2018: Fever blister No date: Glaucoma     Comment:  vitreous degeneration No date: History of  kidney stones No date: Hyperlipidemia No date: Hypertension No date: Hypothyroidism No date: Occlusion, cerebral artery     Comment:  NOS w/infarction No date: Osteoporosis 2006: Personal history of radiation therapy     Comment:  BREAST CA No date: Stroke ([place]) No date: Ulcer   Reproductive/Obstetrics                             Anesthesia Physical Anesthesia Plan  ASA: III  Anesthesia Plan: General   Post-op Pain Management:    Induction: Intravenous  PONV Risk Score and Plan: 2 and Propofol infusion  Airway Management Planned: Natural Airway  Additional Equipment:   Intra-op Plan:   Post-operative Plan:   Informed Consent: I have reviewed the patients History and Physical, chart, labs and discussed the procedure including the risks, benefits and alternatives for the proposed anesthesia with the patient or authorized representative who has indicated his/her understanding and acceptance.     Dental advisory given  Plan Discussed with: CRNA and Anesthesiologist  Anesthesia Plan Comments:         Anesthesia Quick Evaluation

## 2020-07-16 NOTE — Anesthesia Procedure Notes (Signed)
Performed by: Demetrius Charity, CRNA Pre-anesthesia Checklist: Patient identified, Emergency Drugs available, Suction available, Patient being monitored and Timeout performed Patient Re-evaluated:Patient Re-evaluated prior to induction Oxygen Delivery Method: Nasal cannula Induction Type: IV induction Placement Confirmation: CO2 detector

## 2020-07-16 NOTE — Op Note (Signed)
[facility]astroenter[facility] Patient Name: Carolyn Campbell Procedure Date: 07/16/2020 8:52 AM MRN: [id number] Account #: [id number] Date of Birth: 1956-03-29 Admit Type: Outpatient Age: 64 Room: [facility] ENDO ROOM 4 Gender: Female Note Status: Finalized Procedure:             Colonoscopy Indications:           Last colonoscopy: June 2020, Follow-up of Crohn's                         disease of the colon, Disease activity assessment of                         Crohn's disease of the colon, Assess therapeutic                         response to therapy of Crohn's disease of the colon Providers:             Lin Landsman MD, MD Medicines:             General Anesthesia Complications:         No immediate complications. Estimated blood loss: None. Procedure:             Pre-Anesthesia Assessment:                        - Prior to the procedure, a History and Physical was                         performed, and patient medications and allergies were                         reviewed. The patient is competent. The risks and                         benefits of the procedure and the sedation options and                         risks were discussed with the patient. All questions                         were answered and informed consent was obtained.                         Patient identification and proposed procedure were                         verified by the physician, the nurse, the                         anesthesiologist, the anesthetist and the technician                         in the pre-procedure area in the procedure room in the                         endoscopy suite. Mental Status Examination: alert and  oriented. Airway Examination: normal oropharyngeal                         airway and neck mobility. Respiratory Examination:                         clear to auscultation. CV Examination: normal.                         Prophylactic  Antibiotics: The patient does not require                         prophylactic antibiotics. Prior Anticoagulants: The                         patient has taken no previous anticoagulant or                         antiplatelet agents. ASA Grade Assessment: III - A                         patient with severe systemic disease. After reviewing                         the risks and benefits, the patient was deemed in                         satisfactory condition to undergo the procedure. The                         anesthesia plan was to use general anesthesia.                         Immediately prior to administration of medications,                         the patient was re-assessed for adequacy to receive                         sedatives. The heart rate, respiratory rate, oxygen                         saturations, blood pressure, adequacy of pulmonary                         ventilation, and response to care were monitored                         throughout the procedure. The physical status of the                         patient was re-assessed after the procedure.                        After obtaining informed consent, the colonoscope was                         passed under direct vision. Throughout the procedure,  the patient's blood pressure, pulse, and oxygen                         saturations were monitored continuously. The                         Colonoscope was introduced through the anus and                         advanced to the the terminal ileum, with                         identification of the appendiceal orifice and IC                         valve. The colonoscopy was performed without                         difficulty. The patient tolerated the procedure well.                         The quality of the bowel preparation was evaluated                         using the BBPS [facility] Bowel Preparation Scale) with                         scores  of: Right Colon = 3, Transverse Colon = 3 and                         Left Colon = 3 (entire mucosa seen well with no                         residual staining, small fragments of stool or opaque                         liquid). The total BBPS score equals 9. Findings:      The perianal and digital rectal examinations were normal. Pertinent       negatives include normal sphincter tone and no palpable rectal lesions.      The terminal ileum appeared normal.      The colon (entire examined portion) appeared normal. Biopsies were taken       with a cold forceps for histology.      The retroflexed view of the distal rectum and anal verge was normal and       showed no anal or rectal abnormalities.      The Simple Endoscopic Score for Crohn's Disease was determined based on       the endoscopic appearance of the mucosa in the following segments:      - Ileum: Findings include no ulcers present, no ulcerated surfaces, no       affected surfaces and no narrowings. Segment score: 0.      - Right Colon: Findings include no ulcers present, no ulcerated       surfaces, no affected surfaces and no narrowings. Segment score: 0.      - Transverse Colon: Findings include no ulcers present, no ulcerated       surfaces,  no affected surfaces and no narrowings. Segment score: 0.      - Left Colon: Findings include no ulcers present, no ulcerated surfaces,       no affected surfaces and no narrowings. Segment score: 0.      - Rectum: Findings include no ulcers present, no ulcerated surfaces, no       affected surfaces and no narrowings. Segment score: 0.      - Total SES-CD aggregate score: 0.      A 3 mm polyp was found in the cecum. The polyp was flat. The polyp was       removed with a cold snare. Resection and retrieval were complete. Impression:            - The examined portion of the ileum was normal.                        - The entire examined colon is normal. Biopsied.                        -  The distal rectum and anal verge are normal on                         retroflexion view.                        - Simple Endoscopic Score for Crohn's Disease: 0,                         mucosal inflammatory changes secondary to quiescent                         Crohn's disease. Recommendation:        - Discharge patient to home (with escort).                        - Resume previous diet today.                        - Continue present medications.                        - Await pathology results.                        - Repeat colonoscopy in 5 years for surveillance.                        - Return to my office as previously scheduled.                        - Continue entyvio Procedure Code(s):     --- Professional ---                        [phone], Colonoscopy, flexible; with removal of                         tumor(s), polyp(s), or other lesion(s) by snare                         technique  01655, 11, Colonoscopy, flexible; with biopsy, single                         or multiple Diagnosis Code(s):     --- Professional ---                        K50.10, Crohn's disease of large intestine without                         complications CPT copyright 2019 American Medical Association. All rights reserved. The codes documented in this report are preliminary and upon coder review may  be revised to meet current compliance requirements. Dr. Ulyess Mort Lin Landsman MD, MD 07/16/2020 9:20:30 AM This report has been signed electronically. Number of Addenda: 0 Note Initiated On: 07/16/2020 8:52 AM Scope Withdrawal Time: 0 hours 13 minutes 21 seconds  Total Procedure Duration: 0 hours 16 minutes 10 seconds  Estimated Blood Loss:  Estimated blood loss: none.      [facility]

## 2020-07-17 ENCOUNTER — Encounter: Payer: Self-pay | Admitting: Gastroenterology

## 2020-07-17 LAB — SURGICAL PATHOLOGY

## 2020-08-04 ENCOUNTER — Other Ambulatory Visit: Payer: Self-pay

## 2020-08-04 ENCOUNTER — Ambulatory Visit (INDEPENDENT_AMBULATORY_CARE_PROVIDER_SITE_OTHER): Payer: Medicare Other | Admitting: Podiatry

## 2020-08-04 ENCOUNTER — Encounter: Payer: Self-pay | Admitting: Podiatry

## 2020-08-04 DIAGNOSIS — M722 Plantar fascial fibromatosis: Secondary | ICD-10-CM

## 2020-08-04 DIAGNOSIS — M76822 Posterior tibial tendinitis, left leg: Secondary | ICD-10-CM | POA: Diagnosis not present

## 2020-08-04 DIAGNOSIS — M2142 Flat foot [pes planus] (acquired), left foot: Secondary | ICD-10-CM

## 2020-08-04 DIAGNOSIS — M2141 Flat foot [pes planus] (acquired), right foot: Secondary | ICD-10-CM

## 2020-08-04 NOTE — Progress Notes (Signed)
  Subjective:  Patient ID: Carolyn Campbell, female    DOB: [birth year],  MRN: [id number]  Chief Complaint  Patient presents with  . Plantar Fasciitis    "I still have a little bit of problems with it, but it is alot better"    64 y.o. female returns with the above complaint. History confirmed with patient.  She brought her orthoses with her today for me to evaluate.  She has had overall improvement.  Objective:  Physical Exam: warm, good capillary refill, no trophic changes or ulcerative lesions, normal DP and PT pulses and normal sensory exam. Left Foot: Pain only now at the insertion of the posterior tibial tendon at the navicular tuberosity   Assessment:   1. Posterior tibial tendinitis of left lower extremity   2. Pes planus of both feet   3. Plantar fasciitis, left      Plan:  Patient was evaluated and treated and all questions answered.  -Recommend she continue stretching and icing of the affected limb -I think she would benefit from a prefabricated orthoses.  She had several that she previously purchased today but most of these were of the gel or softer form.  I recommended that she try some power steps which were dispensed today -Continue Voltaren gel as needed   Return in about 2 months (around 10/04/2020).

## 2020-09-03 ENCOUNTER — Ambulatory Visit (INDEPENDENT_AMBULATORY_CARE_PROVIDER_SITE_OTHER): Payer: Medicare Other | Admitting: Family Medicine

## 2020-09-03 ENCOUNTER — Encounter: Payer: Self-pay | Admitting: Family Medicine

## 2020-09-03 ENCOUNTER — Other Ambulatory Visit: Payer: Self-pay

## 2020-09-03 VITALS — BP 118/72 | HR 81 | Temp 97.8°F | Resp 14 | Ht 60.0 in | Wt 146.5 lb

## 2020-09-03 DIAGNOSIS — I69922 Dysarthria following unspecified cerebrovascular disease: Secondary | ICD-10-CM

## 2020-09-03 DIAGNOSIS — Z780 Asymptomatic menopausal state: Secondary | ICD-10-CM

## 2020-09-03 DIAGNOSIS — D849 Immunodeficiency, unspecified: Secondary | ICD-10-CM

## 2020-09-03 DIAGNOSIS — Z1231 Encounter for screening mammogram for malignant neoplasm of breast: Secondary | ICD-10-CM

## 2020-09-03 DIAGNOSIS — E7800 Pure hypercholesterolemia, unspecified: Secondary | ICD-10-CM

## 2020-09-03 DIAGNOSIS — K219 Gastro-esophageal reflux disease without esophagitis: Secondary | ICD-10-CM

## 2020-09-03 DIAGNOSIS — E039 Hypothyroidism, unspecified: Secondary | ICD-10-CM

## 2020-09-03 DIAGNOSIS — Z5181 Encounter for therapeutic drug level monitoring: Secondary | ICD-10-CM

## 2020-09-03 DIAGNOSIS — I700 Atherosclerosis of aorta: Secondary | ICD-10-CM

## 2020-09-03 DIAGNOSIS — I69359 Hemiplegia and hemiparesis following cerebral infarction affecting unspecified side: Secondary | ICD-10-CM

## 2020-09-03 DIAGNOSIS — M8580 Other specified disorders of bone density and structure, unspecified site: Secondary | ICD-10-CM

## 2020-09-03 DIAGNOSIS — I10 Essential (primary) hypertension: Secondary | ICD-10-CM | POA: Diagnosis not present

## 2020-09-03 DIAGNOSIS — K50919 Crohn's disease, unspecified, with unspecified complications: Secondary | ICD-10-CM

## 2020-09-03 DIAGNOSIS — F325 Major depressive disorder, single episode, in full remission: Secondary | ICD-10-CM

## 2020-09-03 DIAGNOSIS — I69919 Unspecified symptoms and signs involving cognitive functions following unspecified cerebrovascular disease: Secondary | ICD-10-CM

## 2020-09-03 MED ORDER — SERTRALINE HCL 25 MG PO TABS: 12.5000 mg | ORAL_TABLET | Freq: Every day | ORAL | 3 refills | Status: DC

## 2020-09-03 NOTE — Patient Instructions (Addendum)
Health Maintenance  Topic Date Due  . DEXA scan (bone density measurement)  06/03/2019  . Mammogram  08/09/2020  . Tetanus Vaccine  03/28/2022  . Colon Cancer Screening  07/16/2025  . Flu Shot  Completed  . COVID-19 Vaccine  Completed  .  Hepatitis C: One time screening is recommended by Center for Disease Control  (CDC) for  adults born from [birth year] through 1967.   Completed  . Pneumonia vaccines  Completed   Mammogram and Bone Density test have been ordered - please call number below to schedule  [facility] at [facility] [place],  [place]  [id number] Get Driving Directions Main: [phone]  Please bring your medications with you to your next office visit

## 2020-09-03 NOTE — Progress Notes (Signed)
Name: Carolyn Campbell   MRN: [id number]    DOB: 18-Apr-1956   Date:09/03/2020       Progress Note  Chief Complaint  Patient presents with  . Hyperlipidemia  . Hypertension  . Hypothyroidism     Subjective:   Carolyn Campbell is a 64 y.o. female, presents to clinic for routine f/up Last routine OV was done by me June 2021, prior to that Dr. Ancil Boozer last February, and before that Raelyn Ensign was her PCP, I saw her a few months ago for acute visit/foot/ankle pain.  She has several specialists she sees - [facility] - does infusions for her, she reports she recently had labs done but I cannot see any labs except for in January 2021  Hypertension:  Currently managed on valsartan-HCTZ, recently switched to this from higher dose HCTZ - complaint of urinary frequency - she may still only be taking HCTZ? She believes she is taking valsartan HCTZ 1/2 tablet once a day and she has noted decrease in urinary frequency and side effects Pt reports good med compliance and denies any SE.   Blood pressure today is well controlled. BP Readings from Last 3 Encounters:  09/03/20 118/72  07/16/20 134/61  05/29/20 124/76  Pt denies CP, SOB, exertional sx, LE edema, palpitation, Ha's, visual disturbances, lightheadedness, hypotension, syncope.  Osteoporosis: Due for repeat bone density - last done in 2018, reviewed today, ordered On fosamax, taking correctly, no SE or concerns, denies dysphagia  Hx of stroke with left sided hemiparesis - needs handicap form/sticker due to weakness- on statin and cannot take ASA -no change  Hypothyroid  - has been on same med for years, no sx or concerns, labs done 6 months ago Lab Results  Component Value Date   TSH 1.10 03/04/2020  on levothyroxine 50 mcg, good med compliance she has no symptoms or concerns and does not feel like she needs to have her blood work redone today  HLD - on crestor 20 mg daily?  Changed in June from lipitor 80 mg due to poor  lipid control, pt may be taking lipitor 40 mg?  Unclear per chart reviewed and other providers med lists and dispense hx She does believe that she stopped Lipitor and started Crestor she has not noticed any side effects with the different statins Repeat labs today due to change in medication Lab Results  Component Value Date   CHOL 222 (H) 03/04/2020   HDL 96 03/04/2020   LDLCALC 110 (H) 03/04/2020   TRIG 74 03/04/2020   CHOLHDL 2.3 03/04/2020   Hx of MDD/anxiety, she wanted to get off zoloft and previously discussed tapering/weaning off, but not too long ago requested refills -  Depression screen [facility] 2/9 09/03/2020 05/29/2020 03/04/2020 11/08/2019 11/06/2019  Decreased Interest 0 0 0 0 0  Down, Depressed, Hopeless 0 0 0 0 0  PHQ - 2 Score 0 0 0 0 0  Altered sleeping 1 0 0 1 -  Tired, decreased energy 1 0 0 1 -  Change in appetite 0 0 0 2 -  Feeling bad or failure about yourself  0 0 0 0 -  Trouble concentrating 0 0 0 0 -  Moving slowly or fidgety/restless 0 0 0 0 -  Suicidal thoughts 0 0 0 0 -  PHQ-9 Score 2 0 0 4 -  Difficult doing work/chores Not difficult at all Not difficult at all Not difficult at all Not difficult at all -  Some recent data  might be hidden   GAD 7 : Generalized Anxiety Score 09/03/2020 12/21/2017  Nervous, Anxious, on Edge 1 0  Control/stop worrying 0 0  Worry too much - different things 0 1  Trouble relaxing 0 0  Restless 0 0  Easily annoyed or irritable 0 0  Afraid - awful might happen 0 1  Total GAD 7 Score 1 2  Anxiety Difficulty Not difficult at all -  She is not sure exactly what dose she is on but she is breaking the pill in half of the most recent prescription which would be 25 mg tablet of Zoloft taking only 12.5 mg a day no problem with decreasing dose gradually over the past 6 months  GERD: on prilosec 40 mg daily  Crohns - sees [facility], recently had f/up colonoscopy 06/2020 reviewed   Health maintenance list was reviewed, recent mutations  were updated and need for mammogram and DEXA were discussed today and ordered   Current Outpatient Medications:  .  alendronate (FOSAMAX) 70 MG tablet, TAKE 1 TABLET BY MOUTH ONCE A WEEK. TAKE WITH A FULL  GLASS OF WATER ON AN EMPTY  STOMACH., Disp: 12 tablet, Rfl: 3 .  hydrochlorothiazide (HYDRODIURIL) 12.5 MG tablet, Take by mouth., Disp: , Rfl:  .  latanoprost (XALATAN) 0.005 % ophthalmic solution, Place 1 drop into both eyes at bedtime. , Disp: , Rfl:  .  levothyroxine (SYNTHROID) 50 MCG tablet, Take 1 tablet (50 mcg total) by mouth daily. On empty stomach, Disp: 90 tablet, Rfl: 3 .  losartan-hydrochlorothiazide (HYZAAR) 50-12.5 MG tablet, Take 0.5 tablets by mouth daily. D/c HCTZ, Disp: 45 tablet, Rfl: 3 .  Multiple Vitamin (MULTIVITAMIN) tablet, Take 1 tablet by mouth daily., Disp: , Rfl:  .  omeprazole (PRILOSEC) 40 MG capsule, Take 1 capsule (40 mg total) by mouth daily before breakfast., Disp: 90 capsule, Rfl: 0 .  rosuvastatin (CRESTOR) 20 MG tablet, Take 1 tablet (20 mg total) by mouth at bedtime., Disp: 90 tablet, Rfl: 3 .  sertraline (ZOLOFT) 25 MG tablet, Take 1 tablet (25 mg total) by mouth daily. Taper dose - take 50 mg po every other day and 25 mg po every over day for 2 weeks, then take 25 mg po daily, Disp: 90 tablet, Rfl: 3 .  triamcinolone ointment (KENALOG) 0.1 %, Apply to affected skin one to two times daily for up to two weeks, Disp: 30 g, Rfl: 0 .  valACYclovir (VALTREX) 1000 MG tablet, Take 1 tablet (1,000 mg total) by mouth 2 (two) times daily., Disp: 30 tablet, Rfl: 5 .  vedolizumab (ENTYVIO) 300 MG injection, Inject into the vein., Disp: , Rfl:   Patient Active Problem List   Diagnosis Date Noted  . Varicose veins of both lower extremities with pain 07/19/2018  . Arthritis of right hand 12/21/2017  . Immunosuppressed status ([place]) 12/21/2017  . Major depression in remission ([place]) 12/21/2017  . Fibrocystic breast changes 06/22/2017  . Osteopenia 10/18/2016  .  Atherosclerosis of aorta ([place]) 12/19/2015  . Vitreous degeneration 05/21/2015  . Glaucoma associated with chamber angle anomalies 05/21/2015  . Crohn's disease ([place]) 05/21/2015  . H/O malignant neoplasm of breast 05/21/2015  . Crohn's disease of both small and large intestine with rectal bleeding ([place]) 12/04/2014  . Solitary pulmonary nodule 11/07/2012  . 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 Cognitive deficits as late effect of cerebrovascular disease 01/20/2010  . CVA, old, hemiparesis ([place]) 04/28/2009  . Adult hypothyroidism 06/12/2008  . Acid  reflux 05/23/2007  . Hypercholesterolemia without hypertriglyceridemia 01/18/2007  . Benign essential HTN 01/18/2007    Past Surgical History:  Procedure Laterality Date  . BREAST BIOPSY Left 2006   POS  . BREAST LUMPECTOMY Left 09/20/2004   positive  . BREAST SURGERY Left    malignant biopsy  . CARDIAC CATHETERIZATION    . COLONOSCOPY  2015  . COLONOSCOPY WITH PROPOFOL N/A 06/08/2017   Procedure: COLONOSCOPY WITH PROPOFOL;  Surgeon: Lin Landsman, MD;  Location: [facility];  Service: Gastroenterology;  Laterality: N/A;  . COLONOSCOPY WITH PROPOFOL N/A 03/08/2019   Procedure: COLONOSCOPY WITH PROPOFOL;  Surgeon: Lin Landsman, MD;  Location: [facility] ENDOSCOPY;  Service: Gastroenterology;  Laterality: N/A;  . COLONOSCOPY WITH PROPOFOL N/A 07/16/2020   Procedure: COLONOSCOPY WITH PROPOFOL;  Surgeon: Lin Landsman, MD;  Location: [facility] ENDOSCOPY;  Service: Gastroenterology;  Laterality: N/A;  . ESOPHAGOGASTRODUODENOSCOPY  2015  . ESOPHAGOGASTRODUODENOSCOPY (EGD) WITH PROPOFOL N/A 06/08/2017   Procedure: ESOPHAGOGASTRODUODENOSCOPY (EGD) WITH PROPOFOL;  Surgeon: Lin Landsman, MD;  Location: [facility];  Service: Gastroenterology;  Laterality: N/A;  . ESOPHAGOGASTRODUODENOSCOPY (EGD) WITH PROPOFOL N/A 03/08/2019   Procedure: ESOPHAGOGASTRODUODENOSCOPY (EGD) WITH PROPOFOL;  Surgeon:  Lin Landsman, MD;  Location: [facility] ENDOSCOPY;  Service: Gastroenterology;  Laterality: N/A;  . FINGER SURGERY     Right small finger  . FRACTURE SURGERY    . [place] STUDY  2015  . TUBAL LIGATION      Family History  Problem Relation Age of Onset  . Heart disease Mother   . Heart attack Mother   . Breast cancer Sister   . Alcohol abuse Father   . Cerebrovascular Accident Father   . Arthritis Father   . Hypertension Father   . Hypertension Other   . Diabetes Other   . Breast cancer Sister 61  . Heart attack Other 41  . Cancer Sister     Social History   Tobacco Use  . Smoking status: Never Smoker  . Smokeless tobacco: Never Used  Vaping Use  . Vaping Use: Never used  Substance Use Topics  . Alcohol use: No    Alcohol/week: 0.0 standard drinks  . Drug use: No     No Known Allergies  Health Maintenance  Topic Date Due  . DEXA SCAN  06/03/2019  . MAMMOGRAM  08/09/2020  . TETANUS/TDAP  03/28/2022  . COLONOSCOPY  07/16/2025  . INFLUENZA VACCINE  Completed  . COVID-19 Vaccine  Completed  . Hepatitis C Screening  Completed  . PNA vac Low Risk Adult  Completed    Chart Review Today: I personally reviewed active problem list, medication list, allergies, family history, social history, health maintenance, notes from last encounter, lab results, imaging with the patient/caregiver today.   Review of Systems  10 Systems reviewed and are negative for acute change except as noted in the HPI.  Objective:   Vitals:   09/03/20 1123  BP: 118/72  Pulse: 81  Resp: 14  Temp: 97.8 F (36.6 C)  TempSrc: Oral  SpO2: 96%  Weight: 146 lb 8 oz (66.5 kg)  Height: 5' (1.524 m)    Body mass index is 28.61 kg/m.  Physical Exam Vitals and nursing note reviewed.  Constitutional:      General: She is not in acute distress.    Appearance: Normal appearance. She is well-developed. She is not ill-appearing, toxic-appearing or diaphoretic.     Interventions: Face  mask in place.  HENT:  Head: Normocephalic and atraumatic.     Right Ear: External ear normal.     Left Ear: External ear normal.  Eyes:     General: Lids are normal. No scleral icterus.       Right eye: No discharge.        Left eye: No discharge.     Conjunctiva/sclera: Conjunctivae normal.  Neck:     Trachea: Phonation normal. No tracheal deviation.  Cardiovascular:     Rate and Rhythm: Normal rate and regular rhythm.     Pulses: Normal pulses.          Radial pulses are 2+ on the right side and 2+ on the left side.       Posterior tibial pulses are 2+ on the right side and 2+ on the left side.     Heart sounds: Normal heart sounds. No murmur heard. No friction rub. No gallop.   Pulmonary:     Effort: Pulmonary effort is normal. No respiratory distress.     Breath sounds: Normal breath sounds. No stridor. No wheezing, rhonchi or rales.  Chest:     Chest wall: No tenderness.  Abdominal:     General: Bowel sounds are normal. There is no distension.     Palpations: Abdomen is soft.  Musculoskeletal:     Right lower leg: No edema.     Left lower leg: No edema.  Skin:    General: Skin is warm and dry.     Coloration: Skin is not jaundiced or pale.     Findings: No rash.  Neurological:     Mental Status: She is alert.     Motor: No abnormal muscle tone.     Gait: Gait normal.  Psychiatric:        Mood and Affect: Mood normal.        Speech: Speech normal.        Behavior: Behavior normal.         Assessment & Plan:     ICD-10-CM   1. Adult hypothyroidism  Z61.0 COMPLETE METABOLIC PANEL WITH GFR   Stable medication dosing over years no symptoms she has good med compliance with levothyroxine 50 mcg daily does not require labs today  2. Benign essential HTN  R60 COMPLETE METABOLIC PANEL WITH GFR   Blood pressure is at goal today, has been stable and well-controlled, decrease side effects with med change to valsartan-HCTZ lower dose  3. Hypercholesterolemia without  hypertriglyceridemia  A54.09 COMPLETE METABOLIC PANEL WITH GFR    Lipid panel   Changed from Lipitor 80 mg to Crestor 20 mg to see if there may be an improvement in her LDL, no side effects with med change  4. Major depression in remission (H[facility])  F32.5 sertraline (ZOLOFT) 25 MG tablet   PHQ-9 and GAD-7 reviewed today patient feels good and wishes to continue decreasing Zoloft currently on 12.5 mg dose daily  5. Immunosuppressed status ([place])  D84.9   6. CVA, old, hemiparesis ([place])  I69.359   7. Atherosclerosis of aorta ([facility])  W11.9 COMPLETE METABOLIC PANEL WITH GFR   On statin, monitoring  8. Crohn's disease with complication, unspecified gastrointestinal tract location [facility])  K50.919    Per gastroenterology, compliant with her follow-up did a colonoscopy recently, no new GI symptoms or bowel changes  9. 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 Cognitive deficits as late effect of cerebrovascular disease  I69.919   11. Gastroesophageal reflux disease, unspecified whether  esophagitis present  K21.9    on prilosec 40 mg OTC, taking daily, sx well controlled, discussed long term SE/risk of PPI  12. Osteopenia, unspecified location  N99.14 COMPLETE METABOLIC PANEL WITH GFR   on fosamax weekly, good med compliance no side effects or concerns, due for follow-up bone density  13. Encounter for medication monitoring  C45.84 COMPLETE METABOLIC PANEL WITH GFR    Lipid panel  14. Encounter for screening mammogram for malignant neoplasm of breast  Z12.31 MM 3D SCREEN BREAST BILATERAL  15. Postmenopausal estrogen deficiency  Z78.0 DG Bone Density     Return in about 6 months (around 03/04/2021) for Routine follow-up.   Delsa Grana, PA-C 09/03/20 11:36 AM

## 2020-09-04 LAB — COMPLETE METABOLIC PANEL WITH GFR
AG Ratio: 1.7 (calc) (ref 1.0–2.5)
ALT: 21 U/L (ref 6–29)
AST: 20 U/L (ref 10–35)
Albumin: 4.3 g/dL (ref 3.6–5.1)
Alkaline phosphatase (APISO): 71 U/L (ref 37–153)
BUN: 12 mg/dL (ref 7–25)
CO2: 30 mmol/L (ref 20–32)
Calcium: 9.7 mg/dL (ref 8.6–10.4)
Chloride: 103 mmol/L (ref 98–110)
Creat: 0.8 mg/dL (ref 0.50–0.99)
GFR, Est African American: 89 mL/min/{1.73_m2} (ref >=60)
GFR, Est Non African American: 77 mL/min/{1.73_m2} (ref >=60)
Globulin: 2.6 g/dL (calc) (ref 1.9–3.7)
Glucose, Bld: 105 mg/dL — ABNORMAL HIGH (ref 65–99)
Potassium: 4.2 mmol/L (ref 3.5–5.3)
Sodium: 140 mmol/L (ref 135–146)
Total Bilirubin: 0.6 mg/dL (ref 0.2–1.2)
Total Protein: 6.9 g/dL (ref 6.1–8.1)

## 2020-09-04 LAB — LIPID PANEL
Cholesterol: 196 mg/dL (ref <200)
HDL: 89 mg/dL (ref >=50)
LDL Cholesterol (Calc): 92 mg/dL (calc)
Non-HDL Cholesterol (Calc): 107 mg/dL (calc) (ref <130)
Total CHOL/HDL Ratio: 2.2 (calc) (ref <5.0)
Triglycerides: 66 mg/dL (ref <150)

## 2020-09-05 ENCOUNTER — Encounter: Payer: Self-pay | Admitting: Family Medicine

## 2020-09-25 ENCOUNTER — Other Ambulatory Visit: Payer: Self-pay

## 2020-09-25 ENCOUNTER — Ambulatory Visit
Admission: RE | Admit: 2020-09-25 | Discharge: 2020-09-25 | Disposition: A | Discharge status: Home / Self Care | Payer: Medicare Other | Source: Ambulatory Visit | Attending: Family Medicine | Admitting: Family Medicine

## 2020-09-25 DIAGNOSIS — Z780 Asymptomatic menopausal state: Secondary | ICD-10-CM | POA: Insufficient documentation

## 2020-09-29 ENCOUNTER — Ambulatory Visit: Payer: Medicare Other | Admitting: Podiatry

## 2020-10-20 ENCOUNTER — Telehealth (INDEPENDENT_AMBULATORY_CARE_PROVIDER_SITE_OTHER): Payer: Self-pay

## 2020-10-20 ENCOUNTER — Telehealth (INDEPENDENT_AMBULATORY_CARE_PROVIDER_SITE_OTHER): Payer: Self-pay | Admitting: Vascular Surgery

## 2020-10-20 NOTE — Telephone Encounter (Signed)
I would suggest that she see her PCP.  Based on what the patient is describing, it wouldn't be caused by veins.  However it can be caused by her back. In reviewing recent neurology note the patient has a history of spinal issues and neurological deceits from a previous stroke.  The patient was recommended to have physical therapy for these exact issues, but she deferred.  Patient should see PCP and if they determine it is a vascular issue, they can contact for follow up .

## 2020-10-20 NOTE — Telephone Encounter (Addendum)
I called the  Pt back per the Message she left at the front desk. She had a laser 2 years ago performed in on her Lt leg. The pt is having pain in her Lt leg that has been on going for 2 weeks. She hurts when she coughs, sometimes the whole Lt side of her body hurts. The pain is a dull pain. The pt is having very little swelling, no redness,and not heat to the touch. The pt does loose sensation in her leg especially when she walks this causes he leg to give way. The pt has no HX of blood clots. Possibly bring in for a DVT U/S and Office visit. Please advise.

## 2020-10-20 NOTE — Telephone Encounter (Signed)
I called the pt and made her aware of the NP's instructions.

## 2020-10-22 ENCOUNTER — Ambulatory Visit
Admission: RE | Admit: 2020-10-22 | Discharge: 2020-10-22 | Disposition: A | Discharge status: Home / Self Care | Payer: Medicare Other | Source: Ambulatory Visit | Attending: Family Medicine | Admitting: Family Medicine

## 2020-10-22 ENCOUNTER — Other Ambulatory Visit: Payer: Self-pay

## 2020-10-22 DIAGNOSIS — Z1231 Encounter for screening mammogram for malignant neoplasm of breast: Secondary | ICD-10-CM | POA: Insufficient documentation

## 2020-10-27 ENCOUNTER — Other Ambulatory Visit: Payer: Self-pay | Admitting: Neurology

## 2020-10-27 DIAGNOSIS — M545 Low back pain, unspecified: Secondary | ICD-10-CM

## 2020-11-02 ENCOUNTER — Other Ambulatory Visit: Payer: Self-pay

## 2020-11-02 ENCOUNTER — Ambulatory Visit
Admission: RE | Admit: 2020-11-02 | Discharge: 2020-11-02 | Disposition: A | Discharge status: Home / Self Care | Payer: Medicare Other | Source: Ambulatory Visit | Attending: Neurology | Admitting: Neurology

## 2020-11-02 DIAGNOSIS — M545 Low back pain, unspecified: Secondary | ICD-10-CM | POA: Diagnosis present

## 2020-11-02 MED ORDER — GADOBUTROL 1 MMOL/ML IV SOLN
7.0000 mL | Freq: Once | INTRAVENOUS | Status: AC | PRN
  Administered 2020-11-02: 7 mL via INTRAVENOUS

## 2020-11-03 ENCOUNTER — Telehealth: Payer: Self-pay | Admitting: Family Medicine

## 2020-11-03 NOTE — Telephone Encounter (Signed)
Informed patient that she can go to [facility] walk in clinic from 1 to 7 to have this looked at. She informed the office that she had a MRI on Sunday. I explained to her that she can pick up a copy at Outpatient imaging and take with her.

## 2020-11-03 NOTE — Telephone Encounter (Signed)
Pt has been having left leg pain for over a month and would like to see Carolyn Campbell unless Carolyn Campbell tells her its ok for her to see another provider. Pt had mri of lower back Sunday by  neurologist at [facility] clinic  and when she cough sometime she get a sharp pain in her thigh. Carolyn Campbell next available appt in march 2022

## 2020-11-04 ENCOUNTER — Other Ambulatory Visit: Payer: Self-pay | Admitting: Gastroenterology

## 2020-11-04 ENCOUNTER — Other Ambulatory Visit: Payer: Self-pay | Admitting: Family Medicine

## 2020-11-04 DIAGNOSIS — L819 Disorder of pigmentation, unspecified: Secondary | ICD-10-CM

## 2020-11-04 MED ORDER — OMEPRAZOLE 40 MG PO CPDR: 40.0000 mg | DELAYED_RELEASE_CAPSULE | Freq: Every day | ORAL | 0 refills | Status: DC

## 2020-11-05 MED ORDER — TRIAMCINOLONE ACETONIDE 0.1 % EX OINT: TOPICAL_OINTMENT | CUTANEOUS | 0 refills | Status: DC

## 2020-11-06 ENCOUNTER — Ambulatory Visit: Payer: Self-pay

## 2020-11-27 ENCOUNTER — Encounter: Payer: Self-pay | Admitting: Family Medicine

## 2020-11-27 ENCOUNTER — Other Ambulatory Visit: Payer: Self-pay

## 2020-12-03 ENCOUNTER — Other Ambulatory Visit: Payer: Self-pay

## 2020-12-03 ENCOUNTER — Ambulatory Visit: Payer: Medicare Other | Attending: Neurology | Admitting: Physical Therapy

## 2020-12-03 DIAGNOSIS — G8929 Other chronic pain: Secondary | ICD-10-CM | POA: Diagnosis present

## 2020-12-03 DIAGNOSIS — M5442 Lumbago with sciatica, left side: Secondary | ICD-10-CM | POA: Diagnosis present

## 2020-12-03 DIAGNOSIS — R262 Difficulty in walking, not elsewhere classified: Secondary | ICD-10-CM | POA: Diagnosis present

## 2020-12-03 NOTE — Therapy (Signed)
[facility] [address] [place], [address] Phone: [phone]   Fax:  [phone]  Physical Therapy Evaluation  Patient Details  Name: Carolyn Campbell MRN: [id number] Date of Birth: September 05, 1956 Referring Provider (PT): Jennings Books, MD (neurology)   Encounter Date: 12/03/2020   PT End of Session - 12/03/20 2107    Visit Number 1    Number of Visits 24    Date for PT Re-Evaluation 02/25/21    Authorization Type [employer] MEDICARE reporting period from 12/03/2020    Progress Note Due on Visit 10    PT Start Time 1030    PT Stop Time 1115    PT Time Calculation (min) 45 min    Activity Tolerance Patient tolerated treatment well    Behavior During Therapy [facility] for tasks assessed/performed           Past Medical History:  Diagnosis Date  . Abdominal pain, epigastric   . Allergic rhinitis   . Anginal pain ([place])    states MD said it was GERD  . Arthritis   . Asthma   . Breast cancer ([place]) 2006   LT LUMPECTOMY  . CD (Crohn's disease) ([place]) 05/21/2015   FOLLOWED BY GI   . Clotting disorder ([place])   . Cognitive deficits as late effect of cerebrovascular disease   . COPD (chronic obstructive pulmonary disease) ([place])   . Crohn's disease of both small and large intestine with rectal bleeding ([place]) 12/04/2014  . Depression    Currently taking zoloft.  . Dermatitis, eczematoid 05/21/2015  . 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 Dyspnea   . Esophageal reflux   . Fever blister 07/19/2018  . Glaucoma    vitreous degeneration  . History of kidney stones   . Hyperlipidemia   . Hypertension   . Hypothyroidism   . Occlusion, cerebral artery    NOS w/infarction  . Osteoporosis   . Personal history of radiation therapy 2006   BREAST CA  . Stroke ([place])   . Ulcer     Past Surgical History:  Procedure Laterality Date  . BREAST BIOPSY Left 2006   POS  . BREAST LUMPECTOMY Left 09/20/2004   positive   . BREAST SURGERY Left    malignant biopsy  . CARDIAC CATHETERIZATION    . COLONOSCOPY  2015  . COLONOSCOPY WITH PROPOFOL N/A 06/08/2017   Procedure: COLONOSCOPY WITH PROPOFOL;  Surgeon: Lin Landsman, MD;  Location: [facility];  Service: Gastroenterology;  Laterality: N/A;  . COLONOSCOPY WITH PROPOFOL N/A 03/08/2019   Procedure: COLONOSCOPY WITH PROPOFOL;  Surgeon: Lin Landsman, MD;  Location: [facility] ENDOSCOPY;  Service: Gastroenterology;  Laterality: N/A;  . COLONOSCOPY WITH PROPOFOL N/A 07/16/2020   Procedure: COLONOSCOPY WITH PROPOFOL;  Surgeon: Lin Landsman, MD;  Location: [facility] ENDOSCOPY;  Service: Gastroenterology;  Laterality: N/A;  . ESOPHAGOGASTRODUODENOSCOPY  2015  . ESOPHAGOGASTRODUODENOSCOPY (EGD) WITH PROPOFOL N/A 06/08/2017   Procedure: ESOPHAGOGASTRODUODENOSCOPY (EGD) WITH PROPOFOL;  Surgeon: Lin Landsman, MD;  Location: [place];  Service: Gastroenterology;  Laterality: N/A;  . ESOPHAGOGASTRODUODENOSCOPY (EGD) WITH PROPOFOL N/A 03/08/2019   Procedure: ESOPHAGOGASTRODUODENOSCOPY (EGD) WITH PROPOFOL;  Surgeon: Lin Landsman, MD;  Location: [facility] ENDOSCOPY;  Service: Gastroenterology;  Laterality: N/A;  . FINGER SURGERY     Right small finger  . FRACTURE SURGERY    . [place] STUDY  2015  . TUBAL LIGATION      There were  no vitals filed for this visit.    Subjective Assessment - 12/03/20 1040    Subjective Patient reports her left leg is bothering her and has been told that it is coming from her back. She does not actually have back pain that bothers her. She states she feels a constant pain in her left leg that wakes her up at night. Last night she had to get up and walk around because she was sitting. Pain is in the entire left leg from the hip to the foot. She was having problems with her left foot so she went to a foot specialist who helped her and that is now doing good but then her leg started hurting. It started off  with a cough and mostly with her thigh. Every time she coughed her left thigh would hurt. That was about 2 months ago. About a month a go the while the whole leg started hurting. Last time she went to the foot doctor was about 4 month ago. The foot doesn't really hurt now but she has pain down the lateral side of her whole leg. She describes the pain as nagging or aching and sharp only when she coughs or sneezes (sharp pain at the thigh). Pain is always in her whole leg. She states every once in a while she has a little pain in her back but doesn't hurt constantly. She is unsure if her back hurts when she coughs. The way she is currently sitting during subjective exam she has a bit of left low back pain. A long time ago her left leg got so bad she could not walk on the left leg. She got some prednisone that helped. This was 5 years ago. So far this episode, she has gotten prednisone that helped but then it started hurting again. Helped for 5-6 hours. She also takes tylenol and aleive also helps for a couple of hours. She has not had any back surgeries or procedures to the low back. Does not use an assistive device. Reports condition is worsening as time goes on.    Pertinent History Patient is a 65 y.o. female who presents to outpatient physical therapy with a referral for medical diagnosis low back pain. This patient's chief complaints consist of left leg pain from the hip to the ankle and occasional left low back pain leading to the following functional deficits: difficulty with usual activities including sleeping (wakes her up even though she takes sleeping medication), prolonged standing, prolonged sitting, prolonged walking, stair, housework, cleaning, etc. Relevant past medical history and comorbidities include history of CVA (2008, affects left side mostly LE), HTN, osteoporosis, Crohn's disease (currently gets infusions), osteopenia, glaucoma, cataract removal, hx of depression (weaning medications with MD  supervision), breast cancer (2006), clotting disorder, hypothyroidism, cardiac catheterization. Patient denies hx of seizures, lung problem, diabetes, unexplained weight loss, changes in bowel or bladder problems, new onset stumbling or dropping things apart from history (L leg feels like it gives out more since leg pain).    Limitations Sitting;Lifting;Walking;Standing;House hold activities;Other (comment)   Functional Limitations: difficulty with usual activities including sleeping (wakes her up even though she takes sleeping medication), prolonged standing, prolonged sitting, prolonged walking, stair, housework, cleaning, etc.   Diagnostic tests MRI report 11/02/2020: "IMPRESSION:  Lumbar spondylosis as outlined.  At L4-L5, there is mild/moderate disc degeneration. Grade 1  anterolisthesis. Posterior annular fissure. Disc uncovering with  disc bulge. Moderate facet arthrosis with mild ligamentum flavum  hypertrophy. Trace right facet joint effusion. Bilateral  neural  foraminal narrowing (moderate right, mild left).  No more than mild spinal canal or neural foraminal narrowing at the  remaining levels.  Edema and enhancement within the right L4 and L5 pedicles/articular  pillars, which may be degenerative or may reflect stress reaction.  Degenerative marrow edema and enhancement along the right L4-L5  facet joint."    Currently in Pain? Yes    Pain Score 3    W: 7/10 (yesterday); B: 1/10   Pain Location Leg   left lateral leg hip to ankle, occasionally in left lower back   Pain Orientation Left    Pain Descriptors / Indicators Aching;Sharp    Pain Radiating Towards left lateral leg hip to ankle. Denies numbness or tingling. Does feel weak at times  (does have hx of stroke affecting L LE).    Pain Onset More than a month ago    Pain Frequency Constant    Aggravating Factors  prolonged walking, prolonged sitting in dependent position, laying on the left side, coughing, stairs, standing.    Pain Relieving  Factors sitting with legs propped up, medications, get up and move around, L ankle rolls    Effect of Pain on Daily Activities Functional Limitations: sleeping (wakes her up even though she takes sleeping medication), prolonged standing, prolonged sitting, prolonged walking, stair, housework, cleaning, etc.              [facility] PT Assessment - 12/04/20 0001      Assessment   Medical Diagnosis low back pain    Referring Provider (PT) Jennings Books, MD (neurology)    Onset Date/Surgical Date 10/05/20    Hand Dominance Right    Next MD Visit 1-2 months from now    Prior Therapy none for this problem prior to current episode of care      Precautions   Precautions None      Restrictions   Weight Bearing Restrictions No      Balance Screen   Has the patient fallen in the past 6 months No    Has the patient had a decrease in activity level because of a fear of falling?  No    Is the patient reluctant to leave their home because of a fear of falling?  No      Home Environment   Living Environment --   no concerns about getting around home safely. does have a basement with steps to go outside     Prior Function   Level of Independence Independent    Vocation Retired    Leisure does things at [employer], puzzles, sewing      Cognition   Overall Cognitive Status Within Functional Limits for tasks assessed      Observation/Other Assessments   Focus on Therapeutic Outcomes (FOTO)  56           OBJECTIVE  OBSERVATION/INSPECTION.  Marland Kitchen Posture: patient shifts weight away from L LE in standing.   . Tremor: none . Muscle bulk: generally WFL  . Bed mobility: supine <> sit and rolling WFL with some hesitation due to pain.  . Transfers: sit <> stand I with shift away from L LE.  . Gait: grossly WFL for household and short community ambulation. More detailed gait analysis deferred to later date as needed.    NEUROLOGICAL Dermatomes . L2-S2 appears diminished on the L compared to the R  except at S1 where patient thinks it may be the same side to side.  Deep Tendon Reflexes  R/L  . 2+/2+ Quadriceps reflex (L4) . 2+/2+ Achilles reflex (S1) Neurodynamic Tests   SLR = L = positive for concordant leg pain   SPINE MOTION Lumbar AROM *Indicates pain - Flexion: = mid tibias (unsure normal distance, reproduces concordant pain down leg and in low back, lingering).  - Extension: = 50%, increased concordant leg pain - Rotation: R = 25% produced pain in L leg and back, L = 50%. - Side Flexion: R = WFL, L = WFL (feels better after but unsure which way.)  PERIPHERAL JOINT MOTION (in degrees) Passive Range of Motion (PROM) Comments: B LE WFL except pain and a little stiffness with left hip IR in 90 degrees flexion.   MUSCLE PERFORMANCE (MMT):  *Indicates pain 12/03/20 Date Date  Joint/Motion R/L R/L R/L  Hip     Flexion 4/3+ / /  Extension (knee ext) 4+/4 / /  Abduction 5/4* / /  Knee     Extension 5/4+* / /  Flexion 4+/3+* / /  Ankle/Foot     Dorsiflexion  5/4 / /  Great toe extension 4+/4+ / /  Eversion 5/4+ / /  Plantarflexion 5/4 / /  Comments: L DF, eversion, PF, knee flexion, hip flexion, weakness quality is neurologic. May be from prior CVA that affected L LE.   REPEATED MOTIONS - side glide at wall (R side towards wall), 1x10 (unclear)  SPECIAL TESTS: Straight leg raise (SLR): R = negative, L = positive for concordant leg pain  FABER: R = negative, L = negative  ACCESSORY MOTION:  - CPA tender in back at lowest lumbar levels  PALPATION: - Tingling in left calf with palpation to deep posterior hip rotators   EDUCATION/COGNITION: Patient is alert and oriented X 4.  Objective measurements completed on examination: See above findings.    TREATMENT:  Therapeutic exercise: to centralize symptoms and improve ROM, strength, muscular endurance, and activity tolerance required for successful completion of functional activities.  - left side glide at wall (R  side toward wall), 1x10 - Education on diagnosis, prognosis, POC, anatomy and physiology of current condition.  - Education on HEP including handout   HOME EXERCISE PROGRAM  Access Code: [email or web address] URL: [email or web address] Date: 12/03/2020 Prepared by: Rosita Kea  Exercises Right Standing Lateral Shift Correction at [place] - 4 x daily - 10-15 reps - 1 second hold     PT Education - 12/04/20 0928    Education Details Exercise purpose/form. Self management techniques. Education on diagnosis, prognosis, POC, anatomy and physiology of current condition Education on HEP including handout    Person(s) Educated Patient    Methods Explanation;Demonstration;Tactile cues;Verbal cues;Handout    Comprehension Verbalized understanding;Returned demonstration;Verbal cues required;Tactile cues required;Need further instruction            PT Short Term Goals - 12/03/20 2106      PT SHORT TERM GOAL #1   Title Be independent with initial home exercise program for self-management of symptoms.    Baseline Initial HEP provided at IE (12/03/2020);    Time 2    Period Weeks    Status New    Target Date 12/17/20             PT Long Term Goals - 12/03/20 2101      PT LONG TERM GOAL #1   Title Be independent with a long-term home exercise program for self-management of symptoms.    Baseline Initial HEP provided at IE (12/03/2020);    Time 12  Period Weeks    Status New   TARGET DATE FOR ALL LONG TERM GOALS: 02/25/2021     PT LONG TERM GOAL #2   Title Demonstrate improved FOTO score to equal or greater than 63 by visit #10 to demonstrate improvement in overall condition and self-reported functional ability.    Baseline 56 (12/03/2020);    Time 12    Period Weeks    Status New      PT LONG TERM GOAL #3   Title Have full lumbar AROM with no compensations or increase in pain in all planes except intermittent end range discomfort to allow patient to complete valued  activities with less difficulty.    Baseline painful and limited - see objective exam (12/03/2020);    Time 12    Period Weeks    Status New      PT LONG TERM GOAL #4   Title Reduce pain with functional activities to equal or less than 1/10 to allow patient to complete usual activities including ADLs, IADLs, and social engagement with less difficulty.    Baseline up to 7/10 (12/03/2020);    Time 12    Period Weeks    Status New      PT LONG TERM GOAL #5   Title Complete community, work and/or recreational activities without limitation due to current condition.    Baseline Functional Limitations: usual activities including sleeping (wakes her up even though she takes sleeping medication), prolonged standing, prolonged sitting, prolonged walking, stair, housework, cleaning, etc (12/03/2020);    Time 12    Period Weeks    Status New                  Plan - 12/04/20 0931    Clinical Impression Statement Patient is a 65 y.o. female referred to outpatient physical therapy with a medical diagnosis of low back pain who presents with signs and symptoms consistent with low back pain with left sided radiculopathy. Patient appears to have progressed to leg dominant symptoms which decreased likelihood of quick resolution but does have good long term prognosis. Patient presents with significant pain, joint stiffness, ROM, muscle performance (strength/power/endurance), muscle tension, paresthesia, and activity tolerance impairments that are limiting ability to complete her usual activities such as usual activities including sleeping (wakes her up even though she takes sleeping medication), prolonged standing, prolonged sitting, prolonged walking, stair, housework, cleaning, etc, without difficulty. Patient will benefit from skilled physical therapy intervention to address current body structure impairments and activity limitations to improve function and work towards goals set in current POC in order to  return to prior level of function or maximal functional improvement.    Personal Factors and Comorbidities Comorbidity 3+;Time since onset of injury/illness/exacerbation;Age;Past/Current Experience    Comorbidities Relevant past medical history and comorbidities include history of CVA (2008, affects left side mostly LE), HTN, osteoporosis, Crohn's disease (currently gets infusions), osteopenia, glaucoma, cataract removal, hx of depression (weaning medications with MD supervision), breast cancer (2006), clotting disorder, hypothyroidism, cardiac catheterization    Examination-Activity Limitations Locomotion Level;Transfers;Stand;Lift;Bed Mobility;Dressing;Squat;Stairs;Sleep;Carry;Sit;Bend;Caring for Others    Examination-Participation Restrictions Community Activity;Meal Prep;Interpersonal Relationship   difficulty with usual activities including sleeping (wakes her up even though she takes sleeping medication), prolonged standing, prolonged sitting, prolonged walking, stair, housework, cleaning, etc.   Stability/Clinical Decision Making Evolving/Moderate complexity    Clinical Decision Making Moderate    Rehab Potential Good    PT Frequency 2x / week    PT Duration 12 weeks  PT Treatment/Interventions ADLs/Self Care Home Management;Cryotherapy;Electrical Stimulation;Moist Heat;Therapeutic activities;Therapeutic exercise;Neuromuscular re-education;Patient/family education;Manual techniques;Joint Manipulations;Traction;Dry needling;Passive range of motion;Spinal Manipulations    PT Next Visit Plan Update HEP as appropriate, strengthening and manual therapy as appropriate.    PT Home Exercise Plan Medbridge Access Code: [email or web address]    Consulted and Agree with Plan of Care Patient           Patient will benefit from skilled therapeutic intervention in order to improve the following deficits and impairments:  Decreased coordination,Decreased activity tolerance,Decreased endurance,Decreased  strength,Difficulty walking,Pain,Decreased mobility,Increased muscle spasms,Decreased range of motion,Hypomobility,Impaired perceived functional ability  Visit Diagnosis: Chronic left-sided low back pain with left-sided sciatica  Difficulty in walking, not elsewhere classified     Problem List Patient Active Problem List   Diagnosis Date Noted  . Varicose veins of both lower extremities with pain 07/19/2018  . Arthritis of right hand 12/21/2017  . Immunosuppressed status ([place]) 12/21/2017  . Major depression in remission ([place]) 12/21/2017  . Fibrocystic breast changes 06/22/2017  . Osteopenia 10/18/2016  . Atherosclerosis of aorta ([place]) 12/19/2015  . Vitreous degeneration 05/21/2015  . Glaucoma associated with chamber angle anomalies 05/21/2015  . Crohn's disease ([place]) 05/21/2015  . H/O malignant neoplasm of breast 05/21/2015  . Crohn's disease of both small and large intestine with rectal bleeding ([place]) 12/04/2014  . Solitary pulmonary nodule 11/07/2012  . 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 Cognitive deficits as late effect of cerebrovascular disease 01/20/2010  . CVA, old, hemiparesis ([place]) 04/28/2009  . Adult hypothyroidism 06/12/2008  . Acid reflux 05/23/2007  . Hypercholesterolemia without hypertriglyceridemia 01/18/2007  . Benign essential HTN 01/18/2007    Everlean Alstrom. Graylon Good, PT, DPT 12/04/20, 9:35 AM   [facility] [address] [place], [address] Phone: [phone]   Fax:  [phone]  Name: Carolyn Campbell MRN: [id number] Date of Birth: Jun 26, 1956

## 2020-12-04 ENCOUNTER — Encounter: Payer: Self-pay | Admitting: Physical Therapy

## 2020-12-08 ENCOUNTER — Other Ambulatory Visit: Payer: Self-pay

## 2020-12-08 ENCOUNTER — Ambulatory Visit: Payer: Medicare Other | Admitting: Physical Therapy

## 2020-12-08 ENCOUNTER — Encounter: Payer: Self-pay | Admitting: Physical Therapy

## 2020-12-08 DIAGNOSIS — G8929 Other chronic pain: Secondary | ICD-10-CM

## 2020-12-08 DIAGNOSIS — M5442 Lumbago with sciatica, left side: Secondary | ICD-10-CM | POA: Diagnosis not present

## 2020-12-08 DIAGNOSIS — R262 Difficulty in walking, not elsewhere classified: Secondary | ICD-10-CM

## 2020-12-08 NOTE — Therapy (Signed)
[facility] [address], [place], [address] Phone: [phone]   Fax:  [phone]  Physical Therapy Treatment  Patient Details  Name: BEONCA GIBB MRN: [id number] Date of Birth: 12-27-1955 Referring Provider (PT): Jennings Books, MD (neurology)   Encounter Date: 12/08/2020   PT End of Session - 12/08/20 1036    Visit Number 2    Number of Visits 24    Date for PT Re-Evaluation 02/25/21    Authorization Type [employer] MEDICARE reporting period from 12/03/2020    Progress Note Due on Visit 10    PT Start Time 0901    PT Stop Time 0941    PT Time Calculation (min) 40 min    Activity Tolerance Patient tolerated treatment well    Behavior During Therapy [facility] for tasks assessed/performed           Past Medical History:  Diagnosis Date  . Abdominal pain, epigastric   . Allergic rhinitis   . Anginal pain ([place])    states MD said it was GERD  . Arthritis   . Asthma   . Breast cancer ([place]) 2006   LT LUMPECTOMY  . CD (Crohn's disease) ([place]) 05/21/2015   FOLLOWED BY GI   . Clotting disorder ([place])   . Cognitive deficits as late effect of cerebrovascular disease   . COPD (chronic obstructive pulmonary disease) ([place])   . Crohn's disease of both small and large intestine with rectal bleeding ([place]) 12/04/2014  . Depression    Currently taking zoloft.  . Dermatitis, eczematoid 05/21/2015  . 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 Dyspnea   . Esophageal reflux   . Fever blister 07/19/2018  . Glaucoma    vitreous degeneration  . History of kidney stones   . Hyperlipidemia   . Hypertension   . Hypothyroidism   . Occlusion, cerebral artery    NOS w/infarction  . Osteoporosis   . Personal history of radiation therapy 2006   BREAST CA  . Stroke ([place])   . Ulcer     Past Surgical History:  Procedure Laterality Date  . BREAST BIOPSY Left 2006   POS  . BREAST LUMPECTOMY Left 09/20/2004   positive   . BREAST SURGERY Left    malignant biopsy  . CARDIAC CATHETERIZATION    . COLONOSCOPY  2015  . COLONOSCOPY WITH PROPOFOL N/A 06/08/2017   Procedure: COLONOSCOPY WITH PROPOFOL;  Surgeon: Lin Landsman, MD;  Location: [place];  Service: Gastroenterology;  Laterality: N/A;  . COLONOSCOPY WITH PROPOFOL N/A 03/08/2019   Procedure: COLONOSCOPY WITH PROPOFOL;  Surgeon: Lin Landsman, MD;  Location: [facility] ENDOSCOPY;  Service: Gastroenterology;  Laterality: N/A;  . COLONOSCOPY WITH PROPOFOL N/A 07/16/2020   Procedure: COLONOSCOPY WITH PROPOFOL;  Surgeon: Lin Landsman, MD;  Location: [facility] ENDOSCOPY;  Service: Gastroenterology;  Laterality: N/A;  . ESOPHAGOGASTRODUODENOSCOPY  2015  . ESOPHAGOGASTRODUODENOSCOPY (EGD) WITH PROPOFOL N/A 06/08/2017   Procedure: ESOPHAGOGASTRODUODENOSCOPY (EGD) WITH PROPOFOL;  Surgeon: Lin Landsman, MD;  Location: [place];  Service: Gastroenterology;  Laterality: N/A;  . ESOPHAGOGASTRODUODENOSCOPY (EGD) WITH PROPOFOL N/A 03/08/2019   Procedure: ESOPHAGOGASTRODUODENOSCOPY (EGD) WITH PROPOFOL;  Surgeon: Lin Landsman, MD;  Location: [facility] ENDOSCOPY;  Service: Gastroenterology;  Laterality: N/A;  . FINGER SURGERY     Right small finger  . FRACTURE SURGERY    . [place] STUDY  2015  . TUBAL LIGATION      There were  no vitals filed for this visit.   Subjective Assessment - 12/08/20 0907    Subjective Reports pain of 8/10 left hip all the way down to ankle upon arrival. Reports her pain has been worse since Saturday after she was very busy doing fun things but it irritated her symptoms. States she felt okay after PT and has been doing the exercises which she feel is helping. Does [place] exercises in sitting.    Pertinent History Patient is a 65 y.o. female who presents to outpatient physical therapy with a referral for medical diagnosis low back pain. This patient's chief complaints consist of left leg pain from the  hip to the ankle and occasional left low back pain leading to the following functional deficits: difficulty with usual activities including sleeping (wakes her up even though she takes sleeping medication), prolonged standing, prolonged sitting, prolonged walking, stair, housework, cleaning, etc. Relevant past medical history and comorbidities include history of CVA (2008, affects left side mostly LE), HTN, osteoporosis, Crohn's disease (currently gets infusions), osteopenia, glaucoma, cataract removal, hx of depression (weaning medications with MD supervision), breast cancer (2006), clotting disorder, hypothyroidism, cardiac catheterization. Patient denies hx of seizures, lung problem, diabetes, unexplained weight loss, changes in bowel or bladder problems, new onset stumbling or dropping things apart from history (L leg feels like it gives out more since leg pain).    Limitations Sitting;Lifting;Walking;Standing;House hold activities;Other (comment)   Functional Limitations: difficulty with usual activities including sleeping (wakes her up even though she takes sleeping medication), prolonged standing, prolonged sitting, prolonged walking, stair, housework, cleaning, etc.   Diagnostic tests MRI report 11/02/2020: "IMPRESSION:  Lumbar spondylosis as outlined.  At L4-L5, there is mild/moderate disc degeneration. Grade 1  anterolisthesis. Posterior annular fissure. Disc uncovering with  disc bulge. Moderate facet arthrosis with mild ligamentum flavum  hypertrophy. Trace right facet joint effusion. Bilateral neural  foraminal narrowing (moderate right, mild left).  No more than mild spinal canal or neural foraminal narrowing at the  remaining levels.  Edema and enhancement within the right L4 and L5 pedicles/articular  pillars, which may be degenerative or may reflect stress reaction.  Degenerative marrow edema and enhancement along the right L4-L5  facet joint."    Pain Onset More than a month ago              TREATMENT:  Therapeutic exercise:to centralize symptoms and improve ROM, strength, muscular endurance, and activity tolerance required for successful completion of functional activities.  - left side glide at wall (R side toward wall), 3x10 (brought pain down from 8/10 to 0/10, centralized).  - ambulation x 150 feet (no pain) - up and down 4 steps (6 inches) with light UE support, 1x10 (no pain).  - standing extension with table behind, 1x2 (discontinued due to pain starting down L LE again).  - left side glide at wall (R side toward wall), 1x10 (centralized).  - sitting with towel roll with postural education (pain starting at left hip).  - left side glide at wall (R side toward wall), 1x10 (improved pain in sitting with towel roll), 2x10 (no pain with sitting with lumbar roll).  - standing extension with table behind, 1x10 (no increase in pain).  - seated left sciatic nerve floss (towel roll behind in chair) 1x10 (uncomfortable at first but improving with reps). Education on purpose and form.  - left side glide at wall (R side toward wall), 1x10 - standing extension with table behind, 1x10 (started to increase pain).  -  left side glide at wall (R side toward wall), 1x10 - education on purpose of exercises, centralization, peripheralization, updated HEP with handout. Discussion of patient's functional limitations.   HOME EXERCISE PROGRAM Access Code: [email or web address] URL: [email or web address] Date: 12/08/2020 Prepared by: Rosita Kea  Exercises Right Standing Lateral Shift Correction at [place] - 20 reps - 1 second hold - every 2 hours - or when pain is present Seated Slump Nerve Glide - 2 x daily - 1 sets - 10 reps Seated Correct Posture    PT Education - 12/08/20 1036    Education Details Exercise purpose/form. Self management techniques.    Person(s) Educated Patient    Methods Explanation;Demonstration;Tactile cues;Verbal cues;Handout    Comprehension  Verbalized understanding;Returned demonstration;Verbal cues required;Tactile cues required;Need further instruction            PT Short Term Goals - 12/08/20 1038      PT SHORT TERM GOAL #1   Title Be independent with initial home exercise program for self-management of symptoms.    Baseline Initial HEP provided at IE (12/03/2020);    Time 2    Period Weeks    Status Achieved    Target Date 12/17/20             PT Long Term Goals - 12/03/20 2101      PT LONG TERM GOAL #1   Title Be independent with a long-term home exercise program for self-management of symptoms.    Baseline Initial HEP provided at IE (12/03/2020);    Time 12    Period Weeks    Status New   TARGET DATE FOR ALL LONG TERM GOALS: 02/25/2021     PT LONG TERM GOAL #2   Title Demonstrate improved FOTO score to equal or greater than 63 by visit #10 to demonstrate improvement in overall condition and self-reported functional ability.    Baseline 56 (12/03/2020);    Time 12    Period Weeks    Status New      PT LONG TERM GOAL #3   Title Have full lumbar AROM with no compensations or increase in pain in all planes except intermittent end range discomfort to allow patient to complete valued activities with less difficulty.    Baseline painful and limited - see objective exam (12/03/2020);    Time 12    Period Weeks    Status New      PT LONG TERM GOAL #4   Title Reduce pain with functional activities to equal or less than 1/10 to allow patient to complete usual activities including ADLs, IADLs, and social engagement with less difficulty.    Baseline up to 7/10 (12/03/2020);    Time 12    Period Weeks    Status New      PT LONG TERM GOAL #5   Title Complete community, work and/or recreational activities without limitation due to current condition.    Baseline Functional Limitations: usual activities including sleeping (wakes her up even though she takes sleeping medication), prolonged standing, prolonged sitting,  prolonged walking, stair, housework, cleaning, etc (12/03/2020);    Time 12    Period Weeks    Status New                 Plan - 12/08/20 1962    Clinical Impression Statement Patient tolerated treatment well and was able to decrease pain in her low back and left leg from 8/10 and antalgic gait to normal gait and less than 1/10 in  left low back and 0/10 in left leg with side gliding exercise. Appears to fit in MDT classification of lumbar derangement syndrome with relevant lateral component. Also responded well to L sciatic nerve floss. Updated HEP to increased frequency of side glide exercise and added nerve glide. Patient would benefit from continued management of limiting condition by skilled physical therapist to address remaining impairments and functional limitations to work towards stated goals and return to PLOF or maximal functional independence.    Personal Factors and Comorbidities Comorbidity 3+;Time since onset of injury/illness/exacerbation;Age;Past/Current Experience    Comorbidities Relevant past medical history and comorbidities include history of CVA (2008, affects left side mostly LE), HTN, osteoporosis, Crohn's disease (currently gets infusions), osteopenia, glaucoma, cataract removal, hx of depression (weaning medications with MD supervision), breast cancer (2006), clotting disorder, hypothyroidism, cardiac catheterization    Examination-Activity Limitations Locomotion Level;Transfers;Stand;Lift;Bed Mobility;Dressing;Squat;Stairs;Sleep;Carry;Sit;Bend;Caring for Others    Examination-Participation Restrictions Community Activity;Meal Prep;Interpersonal Relationship   difficulty with usual activities including sleeping (wakes her up even though she takes sleeping medication), prolonged standing, prolonged sitting, prolonged walking, stair, housework, cleaning, etc.   Stability/Clinical Decision Making Evolving/Moderate complexity    Rehab Potential Good    PT Frequency 2x /  week    PT Duration 12 weeks    PT Treatment/Interventions ADLs/Self Care Home Management;Cryotherapy;Electrical Stimulation;Moist Heat;Therapeutic activities;Therapeutic exercise;Neuromuscular re-education;Patient/family education;Manual techniques;Joint Manipulations;Traction;Dry needling;Passive range of motion;Spinal Manipulations    PT Next Visit Plan Update HEP as appropriate, strengthening and manual therapy as appropriate.    PT Home Exercise Plan Medbridge Access Code: [email or web address]    Consulted and Agree with Plan of Care Patient           Patient will benefit from skilled therapeutic intervention in order to improve the following deficits and impairments:  Decreased coordination,Decreased activity tolerance,Decreased endurance,Decreased strength,Difficulty walking,Pain,Decreased mobility,Increased muscle spasms,Decreased range of motion,Hypomobility,Impaired perceived functional ability  Visit Diagnosis: Chronic left-sided low back pain with left-sided sciatica  Difficulty in walking, not elsewhere classified     Problem List Patient Active Problem List   Diagnosis Date Noted  . Varicose veins of both lower extremities with pain 07/19/2018  . Arthritis of right hand 12/21/2017  . Immunosuppressed status ([place]) 12/21/2017  . Major depression in remission ([place]) 12/21/2017  . Fibrocystic breast changes 06/22/2017  . Osteopenia 10/18/2016  . Atherosclerosis of aorta ([place]) 12/19/2015  . Vitreous degeneration 05/21/2015  . Glaucoma associated with chamber angle anomalies 05/21/2015  . Crohn's disease ([place]) 05/21/2015  . H/O malignant neoplasm of breast 05/21/2015  . Crohn's disease of both small and large intestine with rectal bleeding ([place]) 12/04/2014  . Solitary pulmonary nodule 11/07/2012  . 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 Cognitive deficits as late effect of cerebrovascular disease 01/20/2010  . CVA, old, hemiparesis ([place]) 04/28/2009  .  Adult hypothyroidism 06/12/2008  . Acid reflux 05/23/2007  . Hypercholesterolemia without hypertriglyceridemia 01/18/2007  . Benign essential HTN 01/18/2007   Everlean Alstrom. Graylon Good, PT, DPT 12/08/20, 10:38 AM  [facility] [address], [place], [address] Phone: [phone]   Fax:  [phone]  Name: JOELLE ROSWELL MRN: [id number] Date of Birth: 1956/08/25

## 2020-12-10 ENCOUNTER — Encounter: Payer: Self-pay | Admitting: Physical Therapy

## 2020-12-10 ENCOUNTER — Ambulatory Visit: Payer: Medicare Other | Admitting: Physical Therapy

## 2020-12-10 ENCOUNTER — Other Ambulatory Visit: Payer: Self-pay

## 2020-12-10 DIAGNOSIS — R262 Difficulty in walking, not elsewhere classified: Secondary | ICD-10-CM

## 2020-12-10 DIAGNOSIS — G8929 Other chronic pain: Secondary | ICD-10-CM

## 2020-12-10 DIAGNOSIS — M5442 Lumbago with sciatica, left side: Secondary | ICD-10-CM

## 2020-12-10 NOTE — Therapy (Signed)
[facility] [address] [place], [address] Phone: [phone]   Fax:  [phone]  Physical Therapy Treatment  Patient Details  Name: Carolyn Campbell MRN: [id number] Date of Birth: 06/29/1956 Referring Provider (PT): Jennings Books, MD (neurology)   Encounter Date: 12/10/2020   PT End of Session - 12/10/20 1135    Visit Number 3    Number of Visits 24    Date for PT Re-Evaluation 02/25/21    Authorization Type [employer] MEDICARE reporting period from 12/03/2020    Progress Note Due on Visit 10    PT Start Time 1120    PT Stop Time 1200    PT Time Calculation (min) 40 min    Activity Tolerance Patient tolerated treatment well    Behavior During Therapy [facility] for tasks assessed/performed           Past Medical History:  Diagnosis Date  . Abdominal pain, epigastric   . Allergic rhinitis   . Anginal pain ([place])    states MD said it was GERD  . Arthritis   . Asthma   . Breast cancer ([place]) 2006   LT LUMPECTOMY  . CD (Crohn's disease) ([place]) 05/21/2015   FOLLOWED BY GI   . Clotting disorder ([place])   . Cognitive deficits as late effect of cerebrovascular disease   . COPD (chronic obstructive pulmonary disease) ([place])   . Crohn's disease of both small and large intestine with rectal bleeding ([place]) 12/04/2014  . Depression    Currently taking zoloft.  . Dermatitis, eczematoid 05/21/2015  . 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 Dyspnea   . Esophageal reflux   . Fever blister 07/19/2018  . Glaucoma    vitreous degeneration  . History of kidney stones   . Hyperlipidemia   . Hypertension   . Hypothyroidism   . Occlusion, cerebral artery    NOS w/infarction  . Osteoporosis   . Personal history of radiation therapy 2006   BREAST CA  . Stroke ([place])   . Ulcer     Past Surgical History:  Procedure Laterality Date  . BREAST BIOPSY Left 2006   POS  . BREAST LUMPECTOMY Left 09/20/2004   positive   . BREAST SURGERY Left    malignant biopsy  . CARDIAC CATHETERIZATION    . COLONOSCOPY  2015  . COLONOSCOPY WITH PROPOFOL N/A 06/08/2017   Procedure: COLONOSCOPY WITH PROPOFOL;  Surgeon: Lin Landsman, MD;  Location: [facility];  Service: Gastroenterology;  Laterality: N/A;  . COLONOSCOPY WITH PROPOFOL N/A 03/08/2019   Procedure: COLONOSCOPY WITH PROPOFOL;  Surgeon: Lin Landsman, MD;  Location: [facility] ENDOSCOPY;  Service: Gastroenterology;  Laterality: N/A;  . COLONOSCOPY WITH PROPOFOL N/A 07/16/2020   Procedure: COLONOSCOPY WITH PROPOFOL;  Surgeon: Lin Landsman, MD;  Location: [facility] ENDOSCOPY;  Service: Gastroenterology;  Laterality: N/A;  . ESOPHAGOGASTRODUODENOSCOPY  2015  . ESOPHAGOGASTRODUODENOSCOPY (EGD) WITH PROPOFOL N/A 06/08/2017   Procedure: ESOPHAGOGASTRODUODENOSCOPY (EGD) WITH PROPOFOL;  Surgeon: Lin Landsman, MD;  Location: [facility];  Service: Gastroenterology;  Laterality: N/A;  . ESOPHAGOGASTRODUODENOSCOPY (EGD) WITH PROPOFOL N/A 03/08/2019   Procedure: ESOPHAGOGASTRODUODENOSCOPY (EGD) WITH PROPOFOL;  Surgeon: Lin Landsman, MD;  Location: [facility] ENDOSCOPY;  Service: Gastroenterology;  Laterality: N/A;  . FINGER SURGERY     Right small finger  . FRACTURE SURGERY    . [place] STUDY  2015  . TUBAL LIGATION      There were  no vitals filed for this visit.   Subjective Assessment - 12/10/20 1122    Subjective Patient reports she is getting a lot of pain when she is sleeping and she wakes up. She has been sleeping on the right side. She thinks she sleeps better on her back but the neurologist suggested not to sleep on her back due to a little bit of sleep apnea. Prior to her back/leg pain she slept on both sides. She reports her pain this morning after doing the exercies this morning is 2/10 at the left knee and calf. Reports when she woke up it was closer to 10/10 but has improved as she has doen her exercises, which she has been  doing at least 4 times a day and more if it is irritated. States the nerve glide was bothering her some.    Pertinent History Patient is a 65 y.o. female who presents to outpatient physical therapy with a referral for medical diagnosis low back pain. This patient's chief complaints consist of left leg pain from the hip to the ankle and occasional left low back pain leading to the following functional deficits: difficulty with usual activities including sleeping (wakes her up even though she takes sleeping medication), prolonged standing, prolonged sitting, prolonged walking, stair, housework, cleaning, etc. Relevant past medical history and comorbidities include history of CVA (2008, affects left side mostly LE), HTN, osteoporosis, Crohn's disease (currently gets infusions), osteopenia, glaucoma, cataract removal, hx of depression (weaning medications with MD supervision), breast cancer (2006), clotting disorder, hypothyroidism, cardiac catheterization. Patient denies hx of seizures, lung problem, diabetes, unexplained weight loss, changes in bowel or bladder problems, new onset stumbling or dropping things apart from history (L leg feels like it gives out more since leg pain).    Limitations Sitting;Lifting;Walking;Standing;House hold activities;Other (comment)   Functional Limitations: difficulty with usual activities including sleeping (wakes her up even though she takes sleeping medication), prolonged standing, prolonged sitting, prolonged walking, stair, housework, cleaning, etc.   Diagnostic tests MRI report 11/02/2020: "IMPRESSION:  Lumbar spondylosis as outlined.  At L4-L5, there is mild/moderate disc degeneration. Grade 1  anterolisthesis. Posterior annular fissure. Disc uncovering with  disc bulge. Moderate facet arthrosis with mild ligamentum flavum  hypertrophy. Trace right facet joint effusion. Bilateral neural  foraminal narrowing (moderate right, mild left).  No more than mild spinal canal or neural  foraminal narrowing at the  remaining levels.  Edema and enhancement within the right L4 and L5 pedicles/articular  pillars, which may be degenerative or may reflect stress reaction.  Degenerative marrow edema and enhancement along the right L4-L5  facet joint."    Currently in Pain? Yes    Pain Score 2     Pain Onset More than a month ago           TREATMENT: Therapeutic exercise:to centralize symptoms and improve ROM, strength, muscular endurance, and activity tolerance required for successful completion of functional activities. - practiced sleeping position with pillows between knee (left side up).  - left side glide at wall (R side toward wall), 4x10 (brought pain down from 2/10 to 0/10, centralized).   - standing lumbar extension, 1x10 - prone lumbar extension, 1x10 (feels it in low back - made arms tired).  (manual therapy - see below) - prone lumbar extension with hips off center to the right, 1x10, (very difficult and a bit painful) - got up and walked around clinic, worse, pain in L leg, antalgic gait - standing left side glide at wall with  slight rotation towards wall to get some extension 1x10 (improved pain again, awkward).  - left side glide at wall (R side toward wall), 1x10 (continued to have improved pain).  - up and down 4 steps (6 inches) with light UE support, 1x10 (no pain) - practiced forwards, sideways, backwards step up/down with L LE on step and BUE support. Discussed strengthening of L LE for stairs and prior advice to lead down with L LE due to stroke.  - runner's step up with L LE on step, B UE support, 2x10  - Education on HEP including handout   Manual therapy: to reduce pain and tissue tension, improve range of motion, neuromodulation, in order to promote improved ability to complete functional activities. - prone CPA throughout lumbar and thoracic spine and L UPA at lowest lumbar levels (concordant pain to back and leg), grade II-IV, to improve motion and  pain.    HOME EXERCISE PROGRAM Access Code: [email or web address] URL: [email or web address] Date: 12/10/2020 Prepared by: Rosita Kea  Exercises Seated Correct Posture Right Standing Lateral Shift Correction at Wall - Repetitions - 30 reps - 1 second hold - every 2 hours - or when pain is present Runner's Step Up/Down - 1 x daily - 1-3 sets - 10 reps     PT Education - 12/10/20 1131    Education Details Exercise purpose/form. Self management techniques    Person(s) Educated Patient    Methods Explanation;Demonstration;Tactile cues;Verbal cues    Comprehension Verbalized understanding;Returned demonstration;Verbal cues required;Tactile cues required;Need further instruction            PT Short Term Goals - 12/08/20 1038      PT SHORT TERM GOAL #1   Title Be independent with initial home exercise program for self-management of symptoms.    Baseline Initial HEP provided at IE (12/03/2020);    Time 2    Period Weeks    Status Achieved    Target Date 12/17/20             PT Long Term Goals - 12/03/20 2101      PT LONG TERM GOAL #1   Title Be independent with a long-term home exercise program for self-management of symptoms.    Baseline Initial HEP provided at IE (12/03/2020);    Time 12    Period Weeks    Status New   TARGET DATE FOR ALL LONG TERM GOALS: 02/25/2021     PT LONG TERM GOAL #2   Title Demonstrate improved FOTO score to equal or greater than 63 by visit #10 to demonstrate improvement in overall condition and self-reported functional ability.    Baseline 56 (12/03/2020);    Time 12    Period Weeks    Status New      PT LONG TERM GOAL #3   Title Have full lumbar AROM with no compensations or increase in pain in all planes except intermittent end range discomfort to allow patient to complete valued activities with less difficulty.    Baseline painful and limited - see objective exam (12/03/2020);    Time 12    Period Weeks    Status New      PT LONG  TERM GOAL #4   Title Reduce pain with functional activities to equal or less than 1/10 to allow patient to complete usual activities including ADLs, IADLs, and social engagement with less difficulty.    Baseline up to 7/10 (12/03/2020);    Time 12    Period Weeks  Status New      PT LONG TERM GOAL #5   Title Complete community, work and/or recreational activities without limitation due to current condition.    Baseline Functional Limitations: usual activities including sleeping (wakes her up even though she takes sleeping medication), prolonged standing, prolonged sitting, prolonged walking, stair, housework, cleaning, etc (12/03/2020);    Time 12    Period Weeks    Status New                 Plan - 12/10/20 2048    Clinical Impression Statement Patient continues to respond best to side glide at the wall. Able to abolish pain in the L LE during session but continues to have some paresthesia in the left LE. Pure extension did not improve symptoms but combined side glide with extension seemed to be effective but no more than pure side glide. Able to progress to L LE strengthening once pain decreased. Discontinued nerve glide HEP due to irritation at home. Patient would benefit from continued management of limiting condition by skilled physical therapist to address remaining impairments and functional limitations to work towards stated goals and return to PLOF or maximal functional independence.    Personal Factors and Comorbidities Comorbidity 3+;Time since onset of injury/illness/exacerbation;Age;Past/Current Experience    Comorbidities Relevant past medical history and comorbidities include history of CVA (2008, affects left side mostly LE), HTN, osteoporosis, Crohn's disease (currently gets infusions), osteopenia, glaucoma, cataract removal, hx of depression (weaning medications with MD supervision), breast cancer (2006), clotting disorder, hypothyroidism, cardiac catheterization     Examination-Activity Limitations Locomotion Level;Transfers;Stand;Lift;Bed Mobility;Dressing;Squat;Stairs;Sleep;Carry;Sit;Bend;Caring for Others    Examination-Participation Restrictions Community Activity;Meal Prep;Interpersonal Relationship   difficulty with usual activities including sleeping (wakes her up even though she takes sleeping medication), prolonged standing, prolonged sitting, prolonged walking, stair, housework, cleaning, etc.   Stability/Clinical Decision Making Evolving/Moderate complexity    Rehab Potential Good    PT Frequency 2x / week    PT Duration 12 weeks    PT Treatment/Interventions ADLs/Self Care Home Management;Cryotherapy;Electrical Stimulation;Moist Heat;Therapeutic activities;Therapeutic exercise;Neuromuscular re-education;Patient/family education;Manual techniques;Joint Manipulations;Traction;Dry needling;Passive range of motion;Spinal Manipulations    PT Next Visit Plan Update HEP as appropriate, strengthening and manual therapy as appropriate.    PT Home Exercise Plan Medbridge Access Code: [email or web address]    Consulted and Agree with Plan of Care Patient           Patient will benefit from skilled therapeutic intervention in order to improve the following deficits and impairments:  Decreased coordination,Decreased activity tolerance,Decreased endurance,Decreased strength,Difficulty walking,Pain,Decreased mobility,Increased muscle spasms,Decreased range of motion,Hypomobility,Impaired perceived functional ability  Visit Diagnosis: Chronic left-sided low back pain with left-sided sciatica  Difficulty in walking, not elsewhere classified     Problem List Patient Active Problem List   Diagnosis Date Noted  . Varicose veins of both lower extremities with pain 07/19/2018  . Arthritis of right hand 12/21/2017  . Immunosuppressed status ([place]) 12/21/2017  . Major depression in remission ([place]) 12/21/2017  . Fibrocystic breast changes 06/22/2017  . Osteopenia  10/18/2016  . Atherosclerosis of aorta ([place]) 12/19/2015  . Vitreous degeneration 05/21/2015  . Glaucoma associated with chamber angle anomalies 05/21/2015  . Crohn's disease ([place]) 05/21/2015  . H/O malignant neoplasm of breast 05/21/2015  . Crohn's disease of both small and large intestine with rectal bleeding ([place]) 12/04/2014  . Solitary pulmonary nodule 11/07/2012  . 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 Cognitive deficits as late effect of cerebrovascular disease 01/20/2010  . CVA,  old, hemiparesis ([place]) 04/28/2009  . Adult hypothyroidism 06/12/2008  . Acid reflux 05/23/2007  . Hypercholesterolemia without hypertriglyceridemia 01/18/2007  . Benign essential HTN 01/18/2007    Everlean Alstrom. Graylon Good, PT, DPT 12/10/20, 8:49 PM  [facility] [address] [place], [address] Phone: [phone]   Fax:  [phone]  Name: Carolyn Campbell MRN: [id number] Date of Birth: 1955/10/29

## 2020-12-15 ENCOUNTER — Ambulatory Visit: Payer: Medicare Other | Admitting: Physical Therapy

## 2020-12-15 ENCOUNTER — Other Ambulatory Visit: Payer: Self-pay

## 2020-12-15 ENCOUNTER — Encounter: Payer: Self-pay | Admitting: Physical Therapy

## 2020-12-15 DIAGNOSIS — R262 Difficulty in walking, not elsewhere classified: Secondary | ICD-10-CM

## 2020-12-15 DIAGNOSIS — M5442 Lumbago with sciatica, left side: Secondary | ICD-10-CM | POA: Diagnosis not present

## 2020-12-15 DIAGNOSIS — G8929 Other chronic pain: Secondary | ICD-10-CM

## 2020-12-15 NOTE — Therapy (Signed)
[name] [address], [place], [address] Phone: [phone]   Fax:  [phone]  Physical Therapy Treatment  Patient Details  Name: Carolyn Campbell MRN: [id number] Date of Birth: 04/20/1956 Referring Provider (PT): Jennings Books, MD (neurology)   Encounter Date: 12/15/2020   PT End of Session - 12/15/20 1523    Visit Number 4    Number of Visits 24    Date for PT Re-Evaluation 02/25/21    Authorization Type [employer] MEDICARE reporting period from 12/03/2020    Progress Note Due on Visit 10    PT Start Time 1518    PT Stop Time 1558    PT Time Calculation (min) 40 min    Activity Tolerance Patient tolerated treatment well    Behavior During Therapy [facility] for tasks assessed/performed           Past Medical History:  Diagnosis Date  . Abdominal pain, epigastric   . Allergic rhinitis   . Anginal pain ([place])    states MD said it was GERD  . Arthritis   . Asthma   . Breast cancer ([place]) 2006   LT LUMPECTOMY  . CD (Crohn's disease) ([place]) 05/21/2015   FOLLOWED BY GI   . Clotting disorder ([place])   . Cognitive deficits as late effect of cerebrovascular disease   . COPD (chronic obstructive pulmonary disease) ([place])   . Crohn's disease of both small and large intestine with rectal bleeding ([place]) 12/04/2014  . Depression    Currently taking zoloft.  . Dermatitis, eczematoid 05/21/2015  . 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 Dyspnea   . Esophageal reflux   . Fever blister 07/19/2018  . Glaucoma    vitreous degeneration  . History of kidney stones   . Hyperlipidemia   . Hypertension   . Hypothyroidism   . Occlusion, cerebral artery    NOS w/infarction  . Osteoporosis   . Personal history of radiation therapy 2006   BREAST CA  . Stroke ([place])   . Ulcer     Past Surgical History:  Procedure Laterality Date  . BREAST BIOPSY Left 2006   POS  . BREAST LUMPECTOMY Left 09/20/2004   positive   . BREAST SURGERY Left    malignant biopsy  . CARDIAC CATHETERIZATION    . COLONOSCOPY  2015  . COLONOSCOPY WITH PROPOFOL N/A 06/08/2017   Procedure: COLONOSCOPY WITH PROPOFOL;  Surgeon: Lin Landsman, MD;  Location: [place];  Service: Gastroenterology;  Laterality: N/A;  . COLONOSCOPY WITH PROPOFOL N/A 03/08/2019   Procedure: COLONOSCOPY WITH PROPOFOL;  Surgeon: Lin Landsman, MD;  Location: [facility] ENDOSCOPY;  Service: Gastroenterology;  Laterality: N/A;  . COLONOSCOPY WITH PROPOFOL N/A 07/16/2020   Procedure: COLONOSCOPY WITH PROPOFOL;  Surgeon: Lin Landsman, MD;  Location: [facility] ENDOSCOPY;  Service: Gastroenterology;  Laterality: N/A;  . ESOPHAGOGASTRODUODENOSCOPY  2015  . ESOPHAGOGASTRODUODENOSCOPY (EGD) WITH PROPOFOL N/A 06/08/2017   Procedure: ESOPHAGOGASTRODUODENOSCOPY (EGD) WITH PROPOFOL;  Surgeon: Lin Landsman, MD;  Location: [place];  Service: Gastroenterology;  Laterality: N/A;  . ESOPHAGOGASTRODUODENOSCOPY (EGD) WITH PROPOFOL N/A 03/08/2019   Procedure: ESOPHAGOGASTRODUODENOSCOPY (EGD) WITH PROPOFOL;  Surgeon: Lin Landsman, MD;  Location: [facility] END[facility]COPY;  Service: Gastroenterology;  Laterality: N/A;  . FINGER SURGERY     Right small finger  . FRACTURE SURGERY    . [place] STUDY  2015  . TUBAL LIGATION      There were  no vitals filed for this visit.   Subjective Assessment - 12/15/20 1520    Subjective Patient reports she is feeling nauseated for about 1 hour upon arrival. Thinks she may have ate something that didn't sit well. States she continues to wake up at night but is able to go back to sleep. Patient reports pain from the left calf to the glute region, worse more proximally 4/10 when she wakes up. She does the exercise and it helps some. It still doesn't take it all the way away.    Pertinent History Patient is a 65 y.o. female who presents to outpatient physical therapy with a referral for medical diagnosis low  back pain. This patient's chief complaints consist of left leg pain from the hip to the ankle and occasional left low back pain leading to the following functional deficits: difficulty with usual activities including sleeping (wakes her up even though she takes sleeping medication), prolonged standing, prolonged sitting, prolonged walking, stair, housework, cleaning, etc. Relevant past medical history and comorbidities include history of CVA (2008, affects left side mostly LE), HTN, osteoporosis, Crohn's disease (currently gets infusions), osteopenia, glaucoma, cataract removal, hx of depression (weaning medications with MD supervision), breast cancer (2006), clotting disorder, hypothyroidism, cardiac catheterization. Patient denies hx of seizures, lung problem, diabetes, unexplained weight loss, changes in bowel or bladder problems, new onset stumbling or dropping things apart from history (L leg feels like it gives out more since leg pain).    Limitations Sitting;Lifting;Walking;Standing;House hold activities;Other (comment)   Functional Limitations: difficulty with usual activities including sleeping (wakes her up even though she takes sleeping medication), prolonged standing, prolonged sitting, prolonged walking, stair, housework, cleaning, etc.   Diagnostic tests MRI report 11/02/2020: "IMPRESSION:  Lumbar spondylosis as outlined.  At L4-L5, there is mild/moderate disc degeneration. Grade 1  anterolisthesis. Posterior annular fissure. Disc uncovering with  disc bulge. Moderate facet arthrosis with mild ligamentum flavum  hypertrophy. Trace right facet joint effusion. Bilateral neural  foraminal narrowing (moderate right, mild left).  No more than mild spinal canal or neural foraminal narrowing at the  remaining levels.  Edema and enhancement within the right L4 and L5 pedicles/articular  pillars, which may be degenerative or may reflect stress reaction.  Degenerative marrow edema and enhancement along the  right L4-L5  facet joint."    Currently in Pain? Yes    Pain Score 4     Pain Onset More than a month ago           TREATMENT: Therapeutic exercise:to centralize symptoms and improve ROM, strength, muscular endurance, and activity tolerance required for successful completion of functional activities.  - left side glide at wall (R side toward wall),1x20  - standing left side glide at wall with slight rotation towards wall to get some extension 1x20 (improved pain again, awkward).  - side heel tap off 5.5inch step with B UE support, 2x10 each side (able to lift up without PF on L LE 5 times, otherwise needed PF help).  - left side glide at wall (R side toward wall),1x20 - hooklying B hip abduction with GTH around knees, 3 second hold, 1x20 - hooklying bridge with isometric abduction against GTB around knees, 1x20 - hooklying bridge with dynamic hip abduction against GTB around knees, 1x20 - abdominal brace with marching, 2x10 each side.  - single leg stance with touch down UE support as needed, 3x30 seconds each side.  - Education on HEP including handout   HOME EXERCISE PROGRAM Access Code: [email or web address]  URL: [email or web address] Date: 12/10/2020 Prepared by: Rosita Kea  Exercises Seated Correct Posture Right Standing Lateral Shift Correction at Wall - Repetitions - 30 reps - 1 second hold - every 2 hours - or when pain is present Runner's Step Up/Down - 1 x daily - 1-3 sets - 10 reps     PT Education - 12/15/20 1522    Education Details Exercise purpose/form. Self management techniques    Person(s) Educated Patient    Methods Explanation;Demonstration;Tactile cues;Verbal cues    Comprehension Verbalized understanding;Returned demonstration;Verbal cues required;Tactile cues required;Need further instruction            PT Short Term Goals - 12/08/20 1038      PT SHORT TERM GOAL #1   Title Be independent with initial home exercise program for  self-management of symptoms.    Baseline Initial HEP provided at IE (12/03/2020);    Time 2    Period Weeks    Status Achieved    Target Date 12/17/20             PT Long Term Goals - 12/03/20 2101      PT LONG TERM GOAL #1   Title Be independent with a long-term home exercise program for self-management of symptoms.    Baseline Initial HEP provided at IE (12/03/2020);    Time 12    Period Weeks    Status New   TARGET DATE FOR ALL LONG TERM GOALS: 02/25/2021     PT LONG TERM GOAL #2   Title Demonstrate improved FOTO score to equal or greater than 63 by visit #10 to demonstrate improvement in overall condition and self-reported functional ability.    Baseline 56 (12/03/2020);    Time 12    Period Weeks    Status New      PT LONG TERM GOAL #3   Title Have full lumbar AROM with no compensations or increase in pain in all planes except intermittent end range discomfort to allow patient to complete valued activities with less difficulty.    Baseline painful and limited - see objective exam (12/03/2020);    Time 12    Period Weeks    Status New      PT LONG TERM GOAL #4   Title Reduce pain with functional activities to equal or less than 1/10 to allow patient to complete usual activities including ADLs, IADLs, and social engagement with less difficulty.    Baseline up to 7/10 (12/03/2020);    Time 12    Period Weeks    Status New      PT LONG TERM GOAL #5   Title Complete community, work and/or recreational activities without limitation due to current condition.    Baseline Functional Limitations: usual activities including sleeping (wakes her up even though she takes sleeping medication), prolonged standing, prolonged sitting, prolonged walking, stair, housework, cleaning, etc (12/03/2020);    Time 12    Period Weeks    Status New                 Plan - 12/15/20 1559    Clinical Impression Statement Patient tolerated treatment well overall although she continues to have  pain down the left leg that responds in session to side glides but does not remain better over longer periods. Does demonstrate significantly less strength in L LE than right and initiated core and progressed LE strengthening. No pain by end of session. Patient would benefit from continued management of limiting condition by skilled physical  therapist to address remaining impairments and functional limitations to work towards stated goals and return to PLOF or maximal functional independence.    Personal Factors and Comorbidities Comorbidity 3+;Time since onset of injury/illness/exacerbation;Age;Past/Current Experience    Comorbidities Relevant past medical history and comorbidities include history of CVA (2008, affects left side mostly LE), HTN, osteoporosis, Crohn's disease (currently gets infusions), osteopenia, glaucoma, cataract removal, hx of depression (weaning medications with MD supervision), breast cancer (2006), clotting disorder, hypothyroidism, cardiac catheterization    Examination-Activity Limitations Locomotion Level;Transfers;Stand;Lift;Bed Mobility;Dressing;Squat;Stairs;Sleep;Carry;Sit;Bend;Caring for Others    Examination-Participation Restrictions Community Activity;Meal Prep;Interpersonal Relationship   difficulty with usual activities including sleeping (wakes her up even though she takes sleeping medication), prolonged standing, prolonged sitting, prolonged walking, stair, housework, cleaning, etc.   Stability/Clinical Decision Making Evolving/Moderate complexity    Rehab Potential Good    PT Frequency 2x / week    PT Duration 12 weeks    PT Treatment/Interventions ADLs/Self Care Home Management;Cryotherapy;Electrical Stimulation;Moist Heat;Therapeutic activities;Therapeutic exercise;Neuromuscular re-education;Patient/family education;Manual techniques;Joint Manipulations;Traction;Dry needling;Passive range of motion;Spinal Manipulations    PT Next Visit Plan Update HEP as  appropriate, strengthening and manual therapy as appropriate.    PT Home Exercise Plan Medbridge Access Code: [email or web address]    Consulted and Agree with Plan of Care Patient           Patient will benefit from skilled therapeutic intervention in order to improve the following deficits and impairments:  Decreased coordination,Decreased activity tolerance,Decreased endurance,Decreased strength,Difficulty walking,Pain,Decreased mobility,Increased muscle spasms,Decreased range of motion,Hypomobility,Impaired perceived functional ability  Visit Diagnosis: Chronic left-sided low back pain with left-sided sciatica  Difficulty in walking, not elsewhere classified     Problem List Patient Active Problem List   Diagnosis Date Noted  . Varicose veins of both lower extremities with pain 07/19/2018  . Arthritis of right hand 12/21/2017  . Immunosuppressed status ([place]) 12/21/2017  . Major depression in remission ([place]) 12/21/2017  . Fibrocystic breast changes 06/22/2017  . Osteopenia 10/18/2016  . Atherosclerosis of aorta ([place]) 12/19/2015  . Vitreous degeneration 05/21/2015  . Glaucoma associated with chamber angle anomalies 05/21/2015  . Crohn's disease ([place]) 05/21/2015  . H/O malignant neoplasm of breast 05/21/2015  . Crohn's disease of both small and large intestine with rectal bleeding ([place]) 12/04/2014  . Solitary pulmonary nodule 11/07/2012  . 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 Cognitive deficits as late effect of cerebrovascular disease 01/20/2010  . CVA, old, hemiparesis ([place]) 04/28/2009  . Adult hypothyroidism 06/12/2008  . Acid reflux 05/23/2007  . Hypercholesterolemia without hypertriglyceridemia 01/18/2007  . Benign essential HTN 01/18/2007    Everlean Alstrom. Graylon Good, PT, DPT 12/15/20, 6:26 PM  [facility] [address] [place], [address] Phone: [phone]   Fax:   [phone]  Name: ALMAS RAKE MRN: [id number] Date of Birth: 1956/08/05

## 2020-12-17 ENCOUNTER — Other Ambulatory Visit: Payer: Self-pay

## 2020-12-17 ENCOUNTER — Encounter: Payer: Self-pay | Admitting: Physical Therapy

## 2020-12-17 ENCOUNTER — Ambulatory Visit: Payer: Medicare Other | Admitting: Physical Therapy

## 2020-12-17 DIAGNOSIS — G8929 Other chronic pain: Secondary | ICD-10-CM

## 2020-12-17 DIAGNOSIS — R262 Difficulty in walking, not elsewhere classified: Secondary | ICD-10-CM

## 2020-12-17 DIAGNOSIS — M5442 Lumbago with sciatica, left side: Secondary | ICD-10-CM | POA: Diagnosis not present

## 2020-12-17 NOTE — Therapy (Signed)
[facility] [address] [place], [address] Phone: [phone]   Fax:  [phone]  Physical Therapy Treatment  Patient Details  Name: Carolyn Campbell MRN: [id number] Date of Birth: [birth year] Referring Provider (PT): Jennings Books, MD (neurology)   Encounter Date: 12/17/2020   PT End of Session - 12/17/20 1159    Visit Number 5    Number of Visits 24    Date for PT Re-Evaluation 02/25/21    Authorization Type [employer] MEDICARE reporting period from 12/03/2020    Progress Note Due on Visit 10    PT Start Time 1120    PT Stop Time 1158    PT Time Calculation (min) 38 min    Activity Tolerance Patient tolerated treatment well    Behavior During Therapy [facility] for tasks assessed/performed           Past Medical History:  Diagnosis Date  . Abdominal pain, epigastric   . Allergic rhinitis   . Anginal pain ([place])    states MD said it was GERD  . Arthritis   . Asthma   . Breast cancer ([place]) 2006   LT LUMPECTOMY  . CD (Crohn's disease) ([place]) 05/21/2015   FOLLOWED BY GI   . Clotting disorder ([place])   . Cognitive deficits as late effect of cerebrovascular disease   . COPD (chronic obstructive pulmonary disease) ([place])   . Crohn's disease of both small and large intestine with rectal bleeding ([place]) 12/04/2014  . Depression    Currently taking zoloft.  . Dermatitis, eczematoid 05/21/2015  . 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 Dyspnea   . Esophageal reflux   . Fever blister 07/19/2018  . Glaucoma    vitreous degeneration  . History of kidney stones   . Hyperlipidemia   . Hypertension   . Hypothyroidism   . Occlusion, cerebral artery    NOS w/infarction  . Osteoporosis   . Personal history of radiation therapy 2006   BREAST CA  . Stroke ([place])   . Ulcer     Past Surgical History:  Procedure Laterality Date  . BREAST BIOPSY Left 2006   POS  . BREAST LUMPECTOMY Left 09/20/2004   positive   . BREAST SURGERY Left    malignant biopsy  . CARDIAC CATHETERIZATION    . COLONOSCOPY  2015  . COLONOSCOPY WITH PROPOFOL N/A 06/08/2017   Procedure: COLONOSCOPY WITH PROPOFOL;  Surgeon: Lin Landsman, MD;  Location: [facility];  Service: Gastroenterology;  Laterality: N/A;  . COLONOSCOPY WITH PROPOFOL N/A 03/08/2019   Procedure: COLONOSCOPY WITH PROPOFOL;  Surgeon: Lin Landsman, MD;  Location: [facility] ENDOSCOPY;  Service: Gastroenterology;  Laterality: N/A;  . COLONOSCOPY WITH PROPOFOL N/A 07/16/2020   Procedure: COLONOSCOPY WITH PROPOFOL;  Surgeon: Lin Landsman, MD;  Location: [facility] ENDOSCOPY;  Service: Gastroenterology;  Laterality: N/A;  . ESOPHAGOGASTRODUODENOSCOPY  2015  . ESOPHAGOGASTRODUODENOSCOPY (EGD) WITH PROPOFOL N/A 06/08/2017   Procedure: ESOPHAGOGASTRODUODENOSCOPY (EGD) WITH PROPOFOL;  Surgeon: Lin Landsman, MD;  Location: [place];  Service: Gastroenterology;  Laterality: N/A;  . ESOPHAGOGASTRODUODENOSCOPY (EGD) WITH PROPOFOL N/A 03/08/2019   Procedure: ESOPHAGOGASTRODUODENOSCOPY (EGD) WITH PROPOFOL;  Surgeon: Lin Landsman, MD;  Location: [facility] ENDOSCOPY;  Service: Gastroenterology;  Laterality: N/A;  . FINGER SURGERY     Right small finger  . FRACTURE SURGERY    . [place] STUDY  2015  . TUBAL LIGATION      There were  no vitals filed for this visit.   Subjective Assessment - 12/17/20 1120    Subjective Patient reports her back is hurting a lot more 5/10 in the left lower back. States she felt okay after last treatment session and did some more of the exercises that evening and woke up the next day with a lot of pain. Leg pain is a lot better though. Feels like it is working its way up. States the side glides did not take the back pain away. Reminds her of what hit felt like when it first started bothering her. Had difficulty sleeping last night past 4am.    Pertinent History Patient is a 65 y.o. female who presents to  outpatient physical therapy with a referral for medical diagnosis low back pain. This patient's chief complaints consist of left leg pain from the hip to the ankle and occasional left low back pain leading to the following functional deficits: difficulty with usual activities including sleeping (wakes her up even though she takes sleeping medication), prolonged standing, prolonged sitting, prolonged walking, stair, housework, cleaning, etc. Relevant past medical history and comorbidities include history of CVA (2008, affects left side mostly LE), HTN, osteoporosis, Crohn's disease (currently gets infusions), osteopenia, glaucoma, cataract removal, hx of depression (weaning medications with MD supervision), breast cancer (2006), clotting disorder, hypothyroidism, cardiac catheterization. Patient denies hx of seizures, lung problem, diabetes, unexplained weight loss, changes in bowel or bladder problems, new onset stumbling or dropping things apart from history (L leg feels like it gives out more since leg pain).    Limitations Sitting;Lifting;Walking;Standing;House hold activities;Other (comment)   Functional Limitations: difficulty with usual activities including sleeping (wakes her up even though she takes sleeping medication), prolonged standing, prolonged sitting, prolonged walking, stair, housework, cleaning, etc.   Diagnostic tests MRI report 11/02/2020: "IMPRESSION:  Lumbar spondylosis as outlined.  At L4-L5, there is mild/moderate disc degeneration. Grade 1  anterolisthesis. Posterior annular fissure. Disc uncovering with  disc bulge. Moderate facet arthrosis with mild ligamentum flavum  hypertrophy. Trace right facet joint effusion. Bilateral neural  foraminal narrowing (moderate right, mild left).  No more than mild spinal canal or neural foraminal narrowing at the  remaining levels.  Edema and enhancement within the right L4 and L5 pedicles/articular  pillars, which may be degenerative or may reflect  stress reaction.  Degenerative marrow edema and enhancement along the right L4-L5  facet joint."    Currently in Pain? Yes    Pain Score 5     Pain Onset More than a month ago              TREATMENT: Therapeutic exercise:to centralize symptoms and improve ROM, strength, muscular endurance, and activity tolerance required for successful completion of functional activities. - left side glide at wall (R side toward wall),1x20 - standing lumbar extension with plinth behind, 1 x20.  - prone press up 1 x10, 1x4 - attempted prone press up with clinician OP for left side glide but unable to tolerate.   Manual therapy: to reduce pain and tissue tension, improve range of motion, neuromodulation, in order to promote improved ability to complete functional activities. PRONE POSITION - STM to lumbar/thoracic paraspinals L > R and deep hip rotator/glute region.  - CPA to lower thoracic and lumbar segments, grades III, with more reps performed on tender segments.   Modality: (unbilled) Dry needling performed to low back musculature to decrease pain and spasms along patient's right low back and leg region with patient in prone utilizing (2)  dry needle(s) .31m x 779mwith (3) sticks at L lumbar multifidi at approximately L3-L5 and (1) stick at left Iliocostalis at approximately L4. Patient educated about the risks and benefits from therapy and verbally consents to treatment.  Dry needling performed by SaEverlean AlstromSnGraylon GoodT, DPT who is certified in this technique.  HOME EXERCISE PROGRAM Access Code: [email or web address]: [email or web address] Date: 12/10/2020 Prepared by: SaRosita KeaExercises Seated Correct Posture Right Standing Lateral Shift Correction at Wall - Repetitions - 30 reps - 1 second hold - every 2 hours - or when pain is present Runner's Step Up/Down - 1 x daily - 1-3 sets - 10 reps    PT Education - 12/17/20 1214    Education Details Exercise purpose/form. Self  management techniques. dry needling    Person(s) Educated Patient    Methods Explanation;Demonstration;Tactile cues;Verbal cues    Comprehension Verbalized understanding;Returned demonstration;Verbal cues required;Tactile cues required;Need further instruction            PT Short Term Goals - 12/08/20 1038      PT SHORT TERM GOAL #1   Title Be independent with initial home exercise program for self-management of symptoms.    Baseline Initial HEP provided at IE (12/03/2020);    Time 2    Period Weeks    Status Achieved    Target Date 12/17/20             PT Long Term Goals - 12/03/20 2101      PT LONG TERM GOAL #1   Title Be independent with a long-term home exercise program for self-management of symptoms.    Baseline Initial HEP provided at IE (12/03/2020);    Time 12    Period Weeks    Status New   TARGET DATE FOR ALL LONG TERM GOALS: 02/25/2021     PT LONG TERM GOAL #2   Title Demonstrate improved FOTO score to equal or greater than 63 by visit #10 to demonstrate improvement in overall condition and self-reported functional ability.    Baseline 56 (12/03/2020);    Time 12    Period Weeks    Status New      PT LONG TERM GOAL #3   Title Have full lumbar AROM with no compensations or increase in pain in all planes except intermittent end range discomfort to allow patient to complete valued activities with less difficulty.    Baseline painful and limited - see objective exam (12/03/2020);    Time 12    Period Weeks    Status New      PT LONG TERM GOAL #4   Title Reduce pain with functional activities to equal or less than 1/10 to allow patient to complete usual activities including ADLs, IADLs, and social engagement with less difficulty.    Baseline up to 7/10 (12/03/2020);    Time 12    Period Weeks    Status New      PT LONG TERM GOAL #5   Title Complete community, work and/or recreational activities without limitation due to current condition.    Baseline Functional  Limitations: usual activities including sleeping (wakes her up even though she takes sleeping medication), prolonged standing, prolonged sitting, prolonged walking, stair, housework, cleaning, etc (12/03/2020);    Time 12    Period Weeks    Status New                 Plan - 12/17/20 1219    Clinical Impression Statement Patient with worsened  pain this session that was not relieved significantly by active motions and with limited response to STM and joint mobilizations until after dry needling that decreased pain, improved sensitivity to palpation near left SIJ and improved muscle relaxation and pliability to touch at the left lumbar paraspinals. Patient apprehensive about needling at first, but seemed very pleased with results. Improved walking pattern after needling as well. Patient would benefit from continued management of limiting condition by skilled physical therapist to address remaining impairments and functional limitations to work towards stated goals and return to PLOF or maximal functional independence.    Personal Factors and Comorbidities Comorbidity 3+;Time since onset of injury/illness/exacerbation;Age;Past/Current Experience    Comorbidities Relevant past medical history and comorbidities include history of CVA (2008, affects left side mostly LE), HTN, osteoporosis, Crohn's disease (currently gets infusions), osteopenia, glaucoma, cataract removal, hx of depression (weaning medications with MD supervision), breast cancer (2006), clotting disorder, hypothyroidism, cardiac catheterization    Examination-Activity Limitations Locomotion Level;Transfers;Stand;Lift;Bed Mobility;Dressing;Squat;Stairs;Sleep;Carry;Sit;Bend;Caring for Others    Examination-Participation Restrictions Community Activity;Meal Prep;Interpersonal Relationship   difficulty with usual activities including sleeping (wakes her up even though she takes sleeping medication), prolonged standing, prolonged sitting,  prolonged walking, stair, housework, cleaning, etc.   Stability/Clinical Decision Making Evolving/Moderate complexity    Rehab Potential Good    PT Frequency 2x / week    PT Duration 12 weeks    PT Treatment/Interventions ADLs/Self Care Home Management;Cryotherapy;Electrical Stimulation;Moist Heat;Therapeutic activities;Therapeutic exercise;Neuromuscular re-education;Patient/family education;Manual techniques;Joint Manipulations;Traction;Dry needling;Passive range of motion;Spinal Manipulations    PT Next Visit Plan Update HEP as appropriate, strengthening and manual therapy as appropriate.    PT Home Exercise Plan Medbridge Access Code: [email or web address]    Consulted and Agree with Plan of Care Patient           Patient will benefit from skilled therapeutic intervention in order to improve the following deficits and impairments:  Decreased coordination,Decreased activity tolerance,Decreased endurance,Decreased strength,Difficulty walking,Pain,Decreased mobility,Increased muscle spasms,Decreased range of motion,Hypomobility,Impaired perceived functional ability  Visit Diagnosis: Chronic left-sided low back pain with left-sided sciatica  Difficulty in walking, not elsewhere classified     Problem List Patient Active Problem List   Diagnosis Date Noted  . Varicose veins of both lower extremities with pain 07/19/2018  . Arthritis of right hand 12/21/2017  . Immunosuppressed status ([place]) 12/21/2017  . Major depression in remission ([place]) 12/21/2017  . Fibrocystic breast changes 06/22/2017  . Osteopenia 10/18/2016  . Atherosclerosis of aorta ([place]) 12/19/2015  . Vitreous degeneration 05/21/2015  . Glaucoma associated with chamber angle anomalies 05/21/2015  . Crohn's disease ([place]) 05/21/2015  . H/O malignant neoplasm of breast 05/21/2015  . Crohn's disease of both small and large intestine with rectal bleeding ([place]) 12/04/2014  . Solitary pulmonary nodule 11/07/2012  . Dysarthria as late  effect of cerebrovascular disease 04/22/2010  . Cognitive deficits as late effect of cerebrovascular disease 01/20/2010  . CVA, old, hemiparesis ([place]) 04/28/2009  . Adult hypothyroidism 06/12/2008  . Acid reflux 05/23/2007  . Hypercholesterolemia without hypertriglyceridemia 01/18/2007  . Benign essential HTN 01/18/2007    Everlean Alstrom. Graylon Good, PT, DPT 12/17/20, 12:20 PM  [facility] [address], [place], [address] Phone: [phone]   Fax:  [phone]  Name: Carolyn Campbell MRN: [id number] Date of Birth: [birth year]

## 2020-12-22 ENCOUNTER — Ambulatory Visit: Payer: Medicare Other | Attending: Neurology | Admitting: Physical Therapy

## 2020-12-22 ENCOUNTER — Other Ambulatory Visit: Payer: Self-pay

## 2020-12-22 ENCOUNTER — Encounter: Payer: Self-pay | Admitting: Physical Therapy

## 2020-12-22 DIAGNOSIS — M5442 Lumbago with sciatica, left side: Secondary | ICD-10-CM | POA: Diagnosis present

## 2020-12-22 DIAGNOSIS — R262 Difficulty in walking, not elsewhere classified: Secondary | ICD-10-CM | POA: Insufficient documentation

## 2020-12-22 DIAGNOSIS — G8929 Other chronic pain: Secondary | ICD-10-CM | POA: Diagnosis present

## 2020-12-22 NOTE — Therapy (Signed)
[facility] [address], [place], [address] Phone: [phone]   Fax:  [phone]  Physical Therapy Treatment  Patient Details  Name: Carolyn Campbell MRN: [id number] Date of Birth: Jan 21, 1956 Referring Provider (PT): Jennings Books, MD (neurology)   Encounter Date: 12/22/2020   PT End of Session - 12/22/20 1127    Visit Number 6    Number of Visits 24    Date for PT Re-Evaluation 02/25/21    Authorization Type [employer] MEDICARE reporting period from 12/03/2020    Progress Note Due on Visit 10    PT Start Time 1120    PT Stop Time 1145    PT Time Calculation (min) 25 min    Activity Tolerance Patient tolerated treatment well    Behavior During Therapy [facility] for tasks assessed/performed           Past Medical History:  Diagnosis Date  . Abdominal pain, epigastric   . Allergic rhinitis   . Anginal pain ([place])    states MD said it was GERD  . Arthritis   . Asthma   . Breast cancer ([place]) 2006   LT LUMPECTOMY  . CD (Crohn's disease) ([place]) 05/21/2015   FOLLOWED BY GI   . Clotting disorder ([place])   . Cognitive deficits as late effect of cerebrovascular disease   . COPD (chronic obstructive pulmonary disease) ([place])   . Crohn's disease of both small and large intestine with rectal bleeding ([place]) 12/04/2014  . Depression    Currently taking zoloft.  . Dermatitis, eczematoid 05/21/2015  . 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 Dyspnea   . Esophageal reflux   . Fever blister 07/19/2018  . Glaucoma    vitreous degeneration  . History of kidney stones   . Hyperlipidemia   . Hypertension   . Hypothyroidism   . Occlusion, cerebral artery    NOS w/infarction  . Osteoporosis   . Personal history of radiation therapy 2006   BREAST CA  . Stroke ([place])   . Ulcer     Past Surgical History:  Procedure Laterality Date  . BREAST BIOPSY Left 2006   POS  . BREAST LUMPECTOMY Left 09/20/2004   positive   . BREAST SURGERY Left    malignant biopsy  . CARDIAC CATHETERIZATION    . COLONOSCOPY  2015  . COLONOSCOPY WITH PROPOFOL N/A 06/08/2017   Procedure: COLONOSCOPY WITH PROPOFOL;  Surgeon: Lin Landsman, MD;  Location: [facility];  Service: Gastroenterology;  Laterality: N/A;  . COLONOSCOPY WITH PROPOFOL N/A 03/08/2019   Procedure: COLONOSCOPY WITH PROPOFOL;  Surgeon: Lin Landsman, MD;  Location[facility] END[facility]COPY;  Service: Gastroenterology;  Laterality: N/A;  . COLONOSCOPY WITH PROPOFOL N/A 07/16/2020   Procedure: COLONOSCOPY WITH PROPOFOL;  Surgeon: Lin Landsman, MD;  Location: [facility] ENDOSCOPY;  Service: Gastroenterology;  Laterality: N/A;  . ESOPHAGOGASTRODUODENOSCOPY  2015  . ESOPHAGOGASTRODUODENOSCOPY (EGD) WITH PROPOFOL N/A 06/08/2017   Procedure: ESOPHAGOGASTRODUODENOSCOPY (EGD) WITH PROPOFOL;  Surgeon: Lin Landsman, MD;  Location: [facility];  Service: Gastroenterology;  Laterality: N/A;  . ESOPHAGOGASTRODUODENOSCOPY (EGD) WITH PROPOFOL N/A 03/08/2019   Procedure: ESOPHAGOGASTRODUODENOSCOPY (EGD) WITH PROPOFOL;  Surgeon: Lin Landsman, MD;  Location: [facility] ENDOSCOPY;  Service: Gastroenterology;  Laterality: N/A;  . FINGER SURGERY     Right small finger  . FRACTURE SURGERY    . [place] STUDY  2015  . TUBAL LIGATION      There were  no vitals filed for this visit.   Subjective Assessment - 12/22/20 1120    Subjective [name] reports she is doing okay today. She states she must leave 15 min early. She states she felt better for a while after last treatment session. Had difficulty sleeping this morning. States she feels a little better than last time. Rates current pain 3-4/10 in the left low back and leg. Wants to know where to put her heating pad. She states her funtion is actually better when the pain is in her back (needs a cane) vs her leg. She states the dry needling felt good.    Pertinent History Patient is a 65 y.o. female who  presents to outpatient physical therapy with a referral for medical diagnosis low back pain. This patient's chief complaints consist of left leg pain from the hip to the ankle and occasional left low back pain leading to the following functional deficits: difficulty with usual activities including sleeping (wakes her up even though she takes sleeping medication), prolonged standing, prolonged sitting, prolonged walking, stair, housework, cleaning, etc. Relevant past medical history and comorbidities include history of CVA (2008, affects left side mostly LE), HTN, osteoporosis, Crohn's disease (currently gets infusions), osteopenia, glaucoma, cataract removal, hx of depression (weaning medications with MD supervision), breast cancer (2006), clotting disorder, hypothyroidism, cardiac catheterization. Patient denies hx of seizures, lung problem, diabetes, unexplained weight loss, changes in bowel or bladder problems, new onset stumbling or dropping things apart from history (L leg feels like it gives out more since leg pain).    Limitations Sitting;Lifting;Walking;Standing;House hold activities;Other (comment)   Functional Limitations: difficulty with usual activities including sleeping (wakes her up even though she takes sleeping medication), prolonged standing, prolonged sitting, prolonged walking, stair, housework, cleaning, etc.   Diagnostic tests MRI report 11/02/2020: "IMPRESSION:  Lumbar spondylosis as outlined.  At L4-L5, there is mild/moderate disc degeneration. Grade 1  anterolisthesis. Posterior annular fissure. Disc uncovering with  disc bulge. Moderate facet arthrosis with mild ligamentum flavum  hypertrophy. Trace right facet joint effusion. Bilateral neural  foraminal narrowing (moderate right, mild left).  No more than mild spinal canal or neural foraminal narrowing at the  remaining levels.  Edema and enhancement within the right L4 and L5 pedicles/articular  pillars, which may be degenerative or may  reflect stress reaction.  Degenerative marrow edema and enhancement along the right L4-L5  facet joint."    Currently in Pain? Yes    Pain Score 4     Pain Onset --           TREATMENT: Therapeutic exercise:to centralize symptoms and improve ROM, strength, muscular endurance, and activity tolerance required for successful completion of functional activities. - left side glide at wall (R side toward wall),2x20 (centralizing) - flexion rotation stretch with legs to the left x 30 seconds - walking in clinic x 2 to assess response to interventions - supine bridge 1 x10 - Education on HEP including handout   Manual therapy: to reduce pain and tissue tension, improve range of motion, neuromodulation, in order to promote improved ability to complete functional activities. SUPINE POSITION - flexion rotation with clinician overpressure, 1x20 seconds on table each direction (legs left feels good, legs right worsens pain). Repeated 3x30 seconds with legs to the left.   Pt required multimodal cuing for proper technique and to facilitate improved neuromuscular control, strength, range of motion, and functional ability resulting in improved performance and form.  HOME EXERCISE PROGRAM Access Code: [email or web address] URL: [email or web address] Date: 12/22/2020  Prepared by: Rosita Kea  Exercises Right Standing Lateral Shift Correction at Wall - Repetitions - 3 x daily - 20 reps - or when pain is present - 1 sets Lumbar Flexion Rotation with Self Overpressure - 1 x daily - 3 reps - 30 seocnds hold Single Leg Stance - 1 x daily - 2 reps - 30 seconds hold Runner's Step Up/Down - 1 x daily - 3 sets - 10 reps    PT Education - 12/22/20 1127    Education Details Exercise purpose/form. Self management techniques    Person(s) Educated Patient    Methods Explanation;Demonstration;Tactile cues;Verbal cues    Comprehension Verbalized understanding;Returned demonstration;Verbal cues  required;Tactile cues required;Need further instruction            PT Short Term Goals - 12/08/20 1038      PT SHORT TERM GOAL #1   Title Be independent with initial home exercise program for self-management of symptoms.    Baseline Initial HEP provided at IE (12/03/2020);    Time 2    Period Weeks    Status Achieved    Target Date 12/17/20             PT Long Term Goals - 12/03/20 2101      PT LONG TERM GOAL #1   Title Be independent with a long-term home exercise program for self-management of symptoms.    Baseline Initial HEP provided at IE (12/03/2020);    Time 12    Period Weeks    Status New   TARGET DATE FOR ALL LONG TERM GOALS: 02/25/2021     PT LONG TERM GOAL #2   Title Demonstrate improved FOTO score to equal or greater than 63 by visit #10 to demonstrate improvement in overall condition and self-reported functional ability.    Baseline 56 (12/03/2020);    Time 12    Period Weeks    Status New      PT LONG TERM GOAL #3   Title Have full lumbar AROM with no compensations or increase in pain in all planes except intermittent end range discomfort to allow patient to complete valued activities with less difficulty.    Baseline painful and limited - see objective exam (12/03/2020);    Time 12    Period Weeks    Status New      PT LONG TERM GOAL #4   Title Reduce pain with functional activities to equal or less than 1/10 to allow patient to complete usual activities including ADLs, IADLs, and social engagement with less difficulty.    Baseline up to 7/10 (12/03/2020);    Time 12    Period Weeks    Status New      PT LONG TERM GOAL #5   Title Complete community, work and/or recreational activities without limitation due to current condition.    Baseline Functional Limitations: usual activities including sleeping (wakes her up even though she takes sleeping medication), prolonged standing, prolonged sitting, prolonged walking, stair, housework, cleaning, etc  (12/03/2020);    Time 12    Period Weeks    Status New                 Plan - 12/22/20 1155    Clinical Impression Statement Patient continues to demonstrate poor between session carry over of centralization seen in clinic. Progressed to flexion rotation with clinician overpressure with mild improvement. Added patient version to HEP. Continued discussing LE strengthening and returned exercises to HEP that patient thought were sufficiently  tolerated. Did okay with bridge today but states it bothered her with the hip abduction in the past. Recommend to continue moving towards core and LE strengthening as tolerated at future visits due to limited response to specific exercise. Patient would benefit from continued management of limiting condition by skilled physical therapist to address remaining impairments and functional limitations to work towards stated goals and return to PLOF or maximal functional independence.    Personal Factors and Comorbidities Comorbidity 3+;Time since onset of injury/illness/exacerbation;Age;Past/Current Experience    Comorbidities Relevant past medical history and comorbidities include history of CVA (2008, affects left side mostly LE), HTN, osteoporosis, Crohn's disease (currently gets infusions), osteopenia, glaucoma, cataract removal, hx of depression (weaning medications with MD supervision), breast cancer (2006), clotting disorder, hypothyroidism, cardiac catheterization    Examination-Activity Limitations Locomotion Level;Transfers;Stand;Lift;Bed Mobility;Dressing;Squat;Stairs;Sleep;Carry;Sit;Bend;Caring for Others    Examination-Participation Restrictions Community Activity;Meal Prep;Interpersonal Relationship   difficulty with usual activities including sleeping (wakes her up even though she takes sleeping medication), prolonged standing, prolonged sitting, prolonged walking, stair, housework, cleaning, etc.   Stability/Clinical Decision Making Evolving/Moderate  complexity    Rehab Potential Good    PT Frequency 2x / week    PT Duration 12 weeks    PT Treatment/Interventions ADLs/Self Care Home Management;Cryotherapy;Electrical Stimulation;Moist Heat;Therapeutic activities;Therapeutic exercise;Neuromuscular re-education;Patient/family education;Manual techniques;Joint Manipulations;Traction;Dry needling;Passive range of motion;Spinal Manipulations    PT Next Visit Plan Update HEP as appropriate, strengthening and manual therapy as appropriate.    PT Home Exercise Plan Medbridge Access Code: [email or web address]    Consulted and Agree with Plan of Care Patient           Patient will benefit from skilled therapeutic intervention in order to improve the following deficits and impairments:  Decreased coordination,Decreased activity tolerance,Decreased endurance,Decreased strength,Difficulty walking,Pain,Decreased mobility,Increased muscle spasms,Decreased range of motion,Hypomobility,Impaired perceived functional ability  Visit Diagnosis: Chronic left-sided low back pain with left-sided sciatica  Difficulty in walking, not elsewhere classified     Problem List Patient Active Problem List   Diagnosis Date Noted  . Varicose veins of both lower extremities with pain 07/19/2018  . Arthritis of right hand 12/21/2017  . Immunosuppressed status ([place]) 12/21/2017  . Major depression in remission ([place]) 12/21/2017  . Fibrocystic breast changes 06/22/2017  . Osteopenia 10/18/2016  . Atherosclerosis of aorta ([place]) 12/19/2015  . Vitreous degeneration 05/21/2015  . Glaucoma associated with chamber angle anomalies 05/21/2015  . Crohn's disease ([place]) 05/21/2015  . H/O malignant neoplasm of breast 05/21/2015  . Crohn's disease of both small and large intestine with rectal bleeding ([place]) 12/04/2014  . Solitary pulmonary nodule 11/07/2012  . 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 Cognitive deficits as late effect of cerebrovascular disease  01/20/2010  . CVA, old, hemiparesis ([place]) 04/28/2009  . Adult hypothyroidism 06/12/2008  . Acid reflux 05/23/2007  . Hypercholesterolemia without hypertriglyceridemia 01/18/2007  . Benign essential HTN 01/18/2007   Everlean Alstrom. Graylon Good, PT, DPT 12/22/20, 11:56 AM  [facility] [address] [place], [address] Phone: [phone]   Fax:  [phone]  Name: Carolyn Campbell MRN: [id number] Date of Birth: September 09, 1956

## 2020-12-24 ENCOUNTER — Ambulatory Visit: Payer: Medicare Other | Admitting: Physical Therapy

## 2020-12-24 ENCOUNTER — Encounter: Payer: Self-pay | Admitting: Physical Therapy

## 2020-12-24 ENCOUNTER — Other Ambulatory Visit: Payer: Self-pay

## 2020-12-24 DIAGNOSIS — R262 Difficulty in walking, not elsewhere classified: Secondary | ICD-10-CM

## 2020-12-24 DIAGNOSIS — G8929 Other chronic pain: Secondary | ICD-10-CM

## 2020-12-24 DIAGNOSIS — M5442 Lumbago with sciatica, left side: Secondary | ICD-10-CM | POA: Diagnosis not present

## 2020-12-24 NOTE — Therapy (Signed)
[facility] [address], [place], [address] Phone: [phone]   Fax:  [phone]  Physical Therapy Treatment  Patient Details  Name: Carolyn Campbell MRN: [id number] Date of Birth: 1956/08/10 Referring Provider (PT): Jennings Books, MD (neurology)   Encounter Date: 12/24/2020   PT End of Session - 12/24/20 1351    Visit Number 7    Number of Visits 24    Date for PT Re-Evaluation 02/25/21    Authorization Type [employer] MEDICARE reporting period from 12/03/2020    Progress Note Due on Visit 10    PT Start Time 1350    PT Stop Time 1428    PT Time Calculation (min) 38 min    Activity Tolerance Patient tolerated treatment well    Behavior During Therapy [facility] for tasks assessed/performed           Past Medical History:  Diagnosis Date  . Abdominal pain, epigastric   . Allergic rhinitis   . Anginal pain ([place])    states MD said it was GERD  . Arthritis   . Asthma   . Breast cancer ([place]) 2006   LT LUMPECTOMY  . CD (Crohn's disease) ([place]) 05/21/2015   FOLLOWED BY GI   . Clotting disorder ([place])   . Cognitive deficits as late effect of cerebrovascular disease   . COPD (chronic obstructive pulmonary disease) ([place])   . Crohn's disease of both small and large intestine with rectal bleeding ([place]) 12/04/2014  . Depression    Currently taking zoloft.  . Dermatitis, eczematoid 05/21/2015  . 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 Dyspnea   . Esophageal reflux   . Fever blister 07/19/2018  . Glaucoma    vitreous degeneration  . History of kidney stones   . Hyperlipidemia   . Hypertension   . Hypothyroidism   . Occlusion, cerebral artery    NOS w/infarction  . Osteoporosis   . Personal history of radiation therapy 2006   BREAST CA  . Stroke ([place])   . Ulcer     Past Surgical History:  Procedure Laterality Date  . BREAST BIOPSY Left 2006   POS  . BREAST LUMPECTOMY Left 09/20/2004   positive   . BREAST SURGERY Left    malignant biopsy  . CARDIAC CATHETERIZATION    . COLONOSCOPY  2015  . COLONOSCOPY WITH PROPOFOL N/A 06/08/2017   Procedure: COLONOSCOPY WITH PROPOFOL;  Surgeon: Lin Landsman, MD;  Location: [facility];  Service: Gastroenterology;  Laterality: N/A;  . COLONOSCOPY WITH PROPOFOL N/A 03/08/2019   Procedure: COLONOSCOPY WITH PROPOFOL;  Surgeon: Lin Landsman, MD;  Location: [facility] ENDOSCOPY;  Service: Gastroenterology;  Laterality: N/A;  . COLONOSCOPY WITH PROPOFOL N/A 07/16/2020   Procedure: COLONOSCOPY WITH PROPOFOL;  Surgeon: Lin Landsman, MD;  Location: [facility] ENDOSCOPY;  Service: Gastroenterology;  Laterality: N/A;  . ESOPHAGOGASTRODUODENOSCOPY  2015  . ESOPHAGOGASTRODUODENOSCOPY (EGD) WITH PROPOFOL N/A 06/08/2017   Procedure: ESOPHAGOGASTRODUODENOSCOPY (EGD) WITH PROPOFOL;  Surgeon: Lin Landsman, MD;  Location: [facility];  Service: Gastroenterology;  Laterality: N/A;  . ESOPHAGOGASTRODUODENOSCOPY (EGD) WITH PROPOFOL N/A 03/08/2019   Procedure: ESOPHAGOGASTRODUODENOSCOPY (EGD) WITH PROPOFOL;  Surgeon: Lin Landsman, MD;  Location: [facility] ENDOSCOPY;  Service: Gastroenterology;  Laterality: N/A;  . FINGER SURGERY     Right small finger  . FRACTURE SURGERY    . [place] STUDY  2015  . TUBAL LIGATION      There were  no vitals filed for this visit.   Subjective Assessment - 12/24/20 1351    Subjective Patient reports her pain is 2-3/10 from the left lower leg up to her back. States she didn't sleep well last night after doing the flexion rotation stretch right before bed. Felt okay after last treatment session. Did some step ups and balances without further irritation. states she notices it his hard for her to get up if she doesn't keep her knee extended.    Pertinent History Patient is a 65 y.o. female who presents to outpatient physical therapy with a referral for medical diagnosis low back pain. This patient's chief  complaints consist of left leg pain from the hip to the ankle and occasional left low back pain leading to the following functional deficits: difficulty with usual activities including sleeping (wakes her up even though she takes sleeping medication), prolonged standing, prolonged sitting, prolonged walking, stair, housework, cleaning, etc. Relevant past medical history and comorbidities include history of CVA (2008, affects left side mostly LE), HTN, osteoporosis, Crohn's disease (currently gets infusions), osteopenia, glaucoma, cataract removal, hx of depression (weaning medications with MD supervision), breast cancer (2006), clotting disorder, hypothyroidism, cardiac catheterization. Patient denies hx of seizures, lung problem, diabetes, unexplained weight loss, changes in bowel or bladder problems, new onset stumbling or dropping things apart from history (L leg feels like it gives out more since leg pain).    Limitations Sitting;Lifting;Walking;Standing;House hold activities;Other (comment)   Functional Limitations: difficulty with usual activities including sleeping (wakes her up even though she takes sleeping medication), prolonged standing, prolonged sitting, prolonged walking, stair, housework, cleaning, etc.   Diagnostic tests MRI report 11/02/2020: "IMPRESSION:  Lumbar spondylosis as outlined.  At L4-L5, there is mild/moderate disc degeneration. Grade 1  anterolisthesis. Posterior annular fissure. Disc uncovering with  disc bulge. Moderate facet arthrosis with mild ligamentum flavum  hypertrophy. Trace right facet joint effusion. Bilateral neural  foraminal narrowing (moderate right, mild left).  No more than mild spinal canal or neural foraminal narrowing at the  remaining levels.  Edema and enhancement within the right L4 and L5 pedicles/articular  pillars, which may be degenerative or may reflect stress reaction.  Degenerative marrow edema and enhancement along the right L4-L5  facet joint."     Currently in Pain? Yes    Pain Score 3               TREATMENT:  Therapeutic exercise:to centralize symptoms and improve ROM, strength, muscular endurance, and activity tolerance required for successful completion of functional activities. - left side glide at wall (R side toward wall),1x47 (centralizing) - supine sciatic nerve glide, 1x15 each side (harder on L) - hooklying abdominal brace, 5 second hold, 1x15 - hooklying abdominal brace with alternating marching, 1x10 each side - hooklying abdominal brace with alternating LE extension, 2x10 each side - hooklying abdominal brace with bridge, 2x10 - hooklying abdominal brace with hip abduction against green theraband, 3 second hold, 1x20 - semi tandem balance with ball toss 2x20 tosses each side. (additional reps completed attempting SLS and varius foot positions to find appropriate challenge). Small pink theraball at rebounder.   - Education on HEP including handout  Pt required multimodal cuing for proper technique and to facilitate improved neuromuscular control, strength, range of motion, and functional ability resulting in improved performance and form.  HOME EXERCISE PROGRAM Access Code: [email or web address] URL: [email or web address] Date: 12/24/2020 Prepared by: Rosita Kea  Exercises Right Standing Lateral Shift Correction at [place] 3 x  daily - 20 reps - or when pain is present - 1 sets Supine 90/90 Sciatic Nerve Glide with Knee Flexion/Extension - 1-2 x daily - 15 reps Supine Transversus Abdominis Bracing with Leg Extension - 1 x daily - 1 sets - 20 reps - 2-5 seconds hold Bridge with Hip Abduction and Resistance - 1 x daily - 1 sets - 20 reps - 2-5 seconds hold Single Leg Stance - 1 x daily - 3 x weekly - 2 reps - 30 seconds hold Runner's Step Up/Down - 1 x daily - 3 x weekly - 3 sets - 10 reps    PT Education - 12/24/20 1354    Education Details Exercise purpose/form. Self management  techniques    Person(s) Educated Patient    Methods Explanation;Demonstration;Tactile cues;Verbal cues    Comprehension Verbalized understanding;Returned demonstration;Verbal cues required;Tactile cues required;Need further instruction            PT Short Term Goals - 12/08/20 1038      PT SHORT TERM GOAL #1   Title Be independent with initial home exercise program for self-management of symptoms.    Baseline Initial HEP provided at IE (12/03/2020);    Time 2    Period Weeks    Status Achieved    Target Date 12/17/20             PT Long Term Goals - 12/03/20 2101      PT LONG TERM GOAL #1   Title Be independent with a long-term home exercise program for self-management of symptoms.    Baseline Initial HEP provided at IE (12/03/2020);    Time 12    Period Weeks    Status New   TARGET DATE FOR ALL LONG TERM GOALS: 02/25/2021     PT LONG TERM GOAL #2   Title Demonstrate improved FOTO score to equal or greater than 63 by visit #10 to demonstrate improvement in overall condition and self-reported functional ability.    Baseline 56 (12/03/2020);    Time 12    Period Weeks    Status New      PT LONG TERM GOAL #3   Title Have full lumbar AROM with no compensations or increase in pain in all planes except intermittent end range discomfort to allow patient to complete valued activities with less difficulty.    Baseline painful and limited - see objective exam (12/03/2020);    Time 12    Period Weeks    Status New      PT LONG TERM GOAL #4   Title Reduce pain with functional activities to equal or less than 1/10 to allow patient to complete usual activities including ADLs, IADLs, and social engagement with less difficulty.    Baseline up to 7/10 (12/03/2020);    Time 12    Period Weeks    Status New      PT LONG TERM GOAL #5   Title Complete community, work and/or recreational activities without limitation due to current condition.    Baseline Functional Limitations: usual  activities including sleeping (wakes her up even though she takes sleeping medication), prolonged standing, prolonged sitting, prolonged walking, stair, housework, cleaning, etc (12/03/2020);    Time 12    Period Weeks    Status New                 Plan - 12/24/20 1417    Clinical Impression Statement Patient continues to have similar symptoms upon arrival to PT visits with limited carry  over from improvement during sessions. Updated approach to core/LE/funcitonal strengthening approach with specific exercise/neurodynamics/manual as needed for pain relief. Patient tolerated treatment well overall. Patient would benefit from continued management of limiting condition by skilled physical therapist to address remaining impairments and functional limitations to work towards stated goals and return to PLOF or maximal functional independence.    Personal Factors and Comorbidities Comorbidity 3+;Time since onset of injury/illness/exacerbation;Age;Past/Current Experience    Comorbidities Relevant past medical history and comorbidities include history of CVA (2008, affects left side mostly LE), HTN, osteoporosis, Crohn's disease (currently gets infusions), osteopenia, glaucoma, cataract removal, hx of depression (weaning medications with MD supervision), breast cancer (2006), clotting disorder, hypothyroidism, cardiac catheterization    Examination-Activity Limitations Locomotion Level;Transfers;Stand;Lift;Bed Mobility;Dressing;Squat;Stairs;Sleep;Carry;Sit;Bend;Caring for Others    Examination-Participation Restrictions Community Activity;Meal Prep;Interpersonal Relationship   difficulty with usual activities including sleeping (wakes her up even though she takes sleeping medication), prolonged standing, prolonged sitting, prolonged walking, stair, housework, cleaning, etc.   Stability/Clinical Decision Making Evolving/Moderate complexity    Rehab Potential Campbell    PT Frequency 2x / week    PT Duration  12 weeks    PT Treatment/Interventions ADLs/Self Care Home Management;Cryotherapy;Electrical Stimulation;Moist Heat;Therapeutic activities;Therapeutic exercise;Neuromuscular re-education;Patient/family education;Manual techniques;Joint Manipulations;Traction;Dry needling;Passive range of motion;Spinal Manipulations    PT Next Visit Plan Update HEP as appropriate, strengthening and manual therapy as appropriate.    PT Home Exercise Plan Medbridge Access Code: [email or web address]    Consulted and Agree with Plan of Care Patient           Patient will benefit from skilled therapeutic intervention in order to improve the following deficits and impairments:  Decreased coordination,Decreased activity tolerance,Decreased endurance,Decreased strength,Difficulty walking,Pain,Decreased mobility,Increased muscle spasms,Decreased range of motion,Hypomobility,Impaired perceived functional ability  Visit Diagnosis: Chronic left-sided low back pain with left-sided sciatica  Difficulty in walking, not elsewhere classified     Problem List Patient Active Problem List   Diagnosis Date Noted  . Varicose veins of both lower extremities with pain 07/19/2018  . Arthritis of right hand 12/21/2017  . Immunosuppressed status ([place]) 12/21/2017  . Major depression in remission ([place]) 12/21/2017  . Fibrocystic breast changes 06/22/2017  . Osteopenia 10/18/2016  . Atherosclerosis of aorta ([place]) 12/19/2015  . Vitreous degeneration 05/21/2015  . Glaucoma associated with chamber angle anomalies 05/21/2015  . Crohn's disease ([place]) 05/21/2015  . H/O malignant neoplasm of breast 05/21/2015  . Crohn's disease of both small and large intestine with rectal bleeding ([place]) 12/04/2014  . Solitary pulmonary nodule 11/07/2012  . 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 Cognitive deficits as late effect of cerebrovascular disease 01/20/2010  . CVA, old, hemiparesis ([place]) 04/28/2009  . Adult hypothyroidism  06/12/2008  . Acid reflux 05/23/2007  . Hypercholesterolemia without hypertriglyceridemia 01/18/2007  . Benign essential HTN 01/18/2007    Carolyn Campbell, PT, DPT 12/24/20, 2:30 PM  [facility] [address] [place], [address] Phone: [phone]   Fax:  [phone]  Name: Carolyn Campbell MRN: [id number] Date of Birth: [birth year]

## 2020-12-29 ENCOUNTER — Other Ambulatory Visit: Payer: Self-pay

## 2020-12-29 ENCOUNTER — Encounter: Payer: Self-pay | Admitting: Physical Therapy

## 2020-12-29 ENCOUNTER — Ambulatory Visit: Payer: Medicare Other | Admitting: Physical Therapy

## 2020-12-29 DIAGNOSIS — G8929 Other chronic pain: Secondary | ICD-10-CM

## 2020-12-29 DIAGNOSIS — M5442 Lumbago with sciatica, left side: Secondary | ICD-10-CM

## 2020-12-29 DIAGNOSIS — R262 Difficulty in walking, not elsewhere classified: Secondary | ICD-10-CM

## 2020-12-29 NOTE — Therapy (Signed)
[facility] [address], [place], [address] Phone: [phone]   Fax:  [phone]  Physical Therapy Treatment  Patient Details  Name: Carolyn Campbell MRN: [id number] Date of Birth: 1956-04-01 Referring Provider (PT): Jennings Books, MD (neurology)   Encounter Date: 12/29/2020   PT End of Session - 12/29/20 1123    Visit Number 8    Number of Visits 24    Date for PT Re-Evaluation 02/25/21    Authorization Type [employer] MEDICARE reporting period from 12/03/2020    Progress Note Due on Visit 10    PT Start Time 1120    PT Stop Time 1200    PT Time Calculation (min) 40 min    Activity Tolerance Patient tolerated treatment well    Behavior During Therapy [facility] for tasks assessed/performed           Past Medical History:  Diagnosis Date  . Abdominal pain, epigastric   . Allergic rhinitis   . Anginal pain ([place])    states MD said it was GERD  . Arthritis   . Asthma   . Breast cancer ([place]) 2006   LT LUMPECTOMY  . CD (Crohn's disease) ([place]) 05/21/2015   FOLLOWED BY GI   . Clotting disorder ([place])   . Cognitive deficits as late effect of cerebrovascular disease   . COPD (chronic obstructive pulmonary disease) ([place])   . Crohn's disease of both small and large intestine with rectal bleeding ([place]) 12/04/2014  . Depression    Currently taking zoloft.  . Dermatitis, eczematoid 05/21/2015  . 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 Dyspnea   . Esophageal reflux   . Fever blister 07/19/2018  . Glaucoma    vitreous degeneration  . History of kidney stones   . Hyperlipidemia   . Hypertension   . Hypothyroidism   . Occlusion, cerebral artery    NOS w/infarction  . Osteoporosis   . Personal history of radiation therapy 2006   BREAST CA  . Stroke ([place])   . Ulcer     Past Surgical History:  Procedure Laterality Date  . BREAST BIOPSY Left 2006   POS  . BREAST LUMPECTOMY Left 09/20/2004   positive   . BREAST SURGERY Left    malignant biopsy  . CARDIAC CATHETERIZATION    . COLONOSCOPY  2015  . COLONOSCOPY WITH PROPOFOL N/A 06/08/2017   Procedure: COLONOSCOPY WITH PROPOFOL;  Surgeon: Lin Landsman, MD;  Location: [facility];  Service: Gastroenterology;  Laterality: N/A;  . COLONOSCOPY WITH PROPOFOL N/A 03/08/2019   Procedure: COLONOSCOPY WITH PROPOFOL;  Surgeon: Lin Landsman, MD;  Location: [facility] ENDOSCOPY;  Service: Gastroenterology;  Laterality: N/A;  . COLONOSCOPY WITH PROPOFOL N/A 07/16/2020   Procedure: COLONOSCOPY WITH PROPOFOL;  Surgeon: Lin Landsman, MD;  Location: [facility] ENDOSCOPY;  Service: Gastroenterology;  Laterality: N/A;  . ESOPHAGOGASTRODUODENOSCOPY  2015  . ESOPHAGOGASTRODUODENOSCOPY (EGD) WITH PROPOFOL N/A 06/08/2017   Procedure: ESOPHAGOGASTRODUODENOSCOPY (EGD) WITH PROPOFOL;  Surgeon: Lin Landsman, MD;  Location: [facility];  Service: Gastroenterology;  Laterality: N/A;  . ESOPHAGOGASTRODUODENOSCOPY (EGD) WITH PROPOFOL N/A 03/08/2019   Procedure: ESOPHAGOGASTRODUODENOSCOPY (EGD) WITH PROPOFOL;  Surgeon: Lin Landsman, MD;  Location: [facility] ENDOSCOPY;  Service: Gastroenterology;  Laterality: N/A;  . FINGER SURGERY     Right small finger  . FRACTURE SURGERY    . [place] STUDY  2015  . TUBAL LIGATION      There were  no vitals filed for this visit.   Subjective Assessment - 12/29/20 1122    Subjective Patient reports she is back at the point now where she may have a little pain but it is not really affecting her. States she is feeling pretty good today. Had a really good weekend. States she "can feel it" in the left thigh and back. States she felt good after last treatment session. Patient reports she skipped the bridge exercise and the nerve glide because she was feeling well and thought these may irritate her pain. She did the other home exercises.    Pertinent History Patient is a 64 y.o. female who presents to  outpatient physical therapy with a referral for medical diagnosis low back pain. This patient's chief complaints consist of left leg pain from the hip to the ankle and occasional left low back pain leading to the following functional deficits: difficulty with usual activities including sleeping (wakes her up even though she takes sleeping medication), prolonged standing, prolonged sitting, prolonged walking, stair, housework, cleaning, etc. Relevant past medical history and comorbidities include history of CVA (2008, affects left side mostly LE), HTN, osteoporosis, Crohn's disease (currently gets infusions), osteopenia, glaucoma, cataract removal, hx of depression (weaning medications with MD supervision), breast cancer (2006), clotting disorder, hypothyroidism, cardiac catheterization. Patient denies hx of seizures, lung problem, diabetes, unexplained weight loss, changes in bowel or bladder problems, new onset stumbling or dropping things apart from history (L leg feels like it gives out more since leg pain).    Limitations Sitting;Lifting;Walking;Standing;House hold activities;Other (comment)   Functional Limitations: difficulty with usual activities including sleeping (wakes her up even though she takes sleeping medication), prolonged standing, prolonged sitting, prolonged walking, stair, housework, cleaning, etc.   Diagnostic tests MRI report 11/02/2020: "IMPRESSION:  Lumbar spondylosis as outlined.  At L4-L5, there is mild/moderate disc degeneration. Grade 1  anterolisthesis. Posterior annular fissure. Disc uncovering with  disc bulge. Moderate facet arthrosis with mild ligamentum flavum  hypertrophy. Trace right facet joint effusion. Bilateral neural  foraminal narrowing (moderate right, mild left).  No more than mild spinal canal or neural foraminal narrowing at the  remaining levels.  Edema and enhancement within the right L4 and L5 pedicles/articular  pillars, which may be degenerative or may reflect  stress reaction.  Degenerative marrow edema and enhancement along the right L4-L5  facet joint."    Currently in Pain? Yes    Pain Score 1            OBJECTIVE FOTO = 58 (12/29/2020)   TREATMENT:  Therapeutic exercise:to centralize symptoms and improve ROM, strength, muscular endurance, and activity tolerance required for successful completion of functional activities. - left side glide at wall (R side toward wall),1x20 (centralizing) - supine sciatic nerve glide, 1x15 each side (harder on L)   - hooklying abdominal brace, 5 second hold, 1x10 - hooklying abdominal brace with alternating LE extension, 1x15 each side - hooklying abdominal brace with LE bicycle, 1x5 each side, 1x9 each side (lost form), 1x7 each side (needs more cuing for breathing).  - hooklying abdominal brace with hip abduction against blue theraband, 3 second hold, 1x20 - quadruped cat-cow lumbar ROM x 10 (diffiuclty flexing lower back) - quadruped bird dog prep and then completion of 5 reps. (some difficulty staying on wrists so long).   Pt required multimodal cuing for proper technique and to facilitate improved neuromuscular control, strength, range of motion, and functional ability resulting in improved performance and form.  HOME EXERCISE PROGRAM  Access Code: [email or web address] URL: [email or web address] Date: 12/24/2020 Prepared by: Rosita Kea  Exercises Right Standing Lateral Shift Correction at Wall - Repetitions - 3 x daily - 20 reps - or when pain is present - 1 sets Supine 90/90 Sciatic Nerve Glide with Knee Flexion/Extension - 1-2 x daily - 15 reps Supine Transversus Abdominis Bracing with Leg Extension - 1 x daily - 1 sets - 20 reps - 2-5 seconds hold Bridge with Hip Abduction and Resistance - 1 x daily - 1 sets - 20 reps - 2-5 seconds hold Single Leg Stance - 1 x daily - 3 x weekly - 2 reps - 30 seconds hold Runner's Step Up/Down - 1 x daily - 3 x weekly    PT Education - 12/29/20  1123    Education Details Exercise purpose/form. Self management techniques    Person(s) Educated Patient    Methods Explanation;Demonstration;Tactile cues;Verbal cues    Comprehension Returned demonstration;Verbal cues required;Verbalized understanding;Tactile cues required;Need further instruction            PT Short Term Goals - 12/08/20 1038      PT SHORT TERM GOAL #1   Title Be independent with initial home exercise program for self-management of symptoms.    Baseline Initial HEP provided at IE (12/03/2020);    Time 2    Period Weeks    Status Achieved    Target Date 12/17/20             PT Long Term Goals - 12/03/20 2101      PT LONG TERM GOAL #1   Title Be independent with a long-term home exercise program for self-management of symptoms.    Baseline Initial HEP provided at IE (12/03/2020);    Time 12    Period Weeks    Status New   TARGET DATE FOR ALL LONG TERM GOALS: 02/25/2021     PT LONG TERM GOAL #2   Title Demonstrate improved FOTO score to equal or greater than 63 by visit #10 to demonstrate improvement in overall condition and self-reported functional ability.    Baseline 56 (12/03/2020);    Time 12    Period Weeks    Status New      PT LONG TERM GOAL #3   Title Have full lumbar AROM with no compensations or increase in pain in all planes except intermittent end range discomfort to allow patient to complete valued activities with less difficulty.    Baseline painful and limited - see objective exam (12/03/2020);    Time 12    Period Weeks    Status New      PT LONG TERM GOAL #4   Title Reduce pain with functional activities to equal or less than 1/10 to allow patient to complete usual activities including ADLs, IADLs, and social engagement with less difficulty.    Baseline up to 7/10 (12/03/2020);    Time 12    Period Weeks    Status New      PT LONG TERM GOAL #5   Title Complete community, work and/or recreational activities without limitation due to  current condition.    Baseline Functional Limitations: usual activities including sleeping (wakes her up even though she takes sleeping medication), prolonged standing, prolonged sitting, prolonged walking, stair, housework, cleaning, etc (12/03/2020);    Time 12    Period Weeks    Status New                 Plan - 12/29/20 1207  Clinical Impression Statement Patient tolerated treatment well overall and was able to advance lumbar stabilization exercises. Did have some limitations in quadruped due to quick wrist fatigue and has difficulty coordinating breathing with abdominal brace. Reported feeling a little better by end of session. Patient would benefit from continued management of limiting condition by skilled physical therapist to address remaining impairments and functional limitations to work towards stated goals and return to PLOF or maximal functional independence.    Personal Factors and Comorbidities Comorbidity 3+;Time since onset of injury/illness/exacerbation;Age;Past/Current Experience    Comorbidities Relevant past medical history and comorbidities include history of CVA (2008, affects left side mostly LE), HTN, osteoporosis, Crohn's disease (currently gets infusions), osteopenia, glaucoma, cataract removal, hx of depression (weaning medications with MD supervision), breast cancer (2006), clotting disorder, hypothyroidism, cardiac catheterization    Examination-Activity Limitations Locomotion Level;Transfers;Stand;Lift;Bed Mobility;Dressing;Squat;Stairs;Sleep;Carry;Sit;Bend;Caring for Others    Examination-Participation Restrictions Community Activity;Meal Prep;Interpersonal Relationship   difficulty with usual activities including sleeping (wakes her up even though she takes sleeping medication), prolonged standing, prolonged sitting, prolonged walking, stair, housework, cleaning, etc.   Stability/Clinical Decision Making Evolving/Moderate complexity    Rehab Potential Good     PT Frequency 2x / week    PT Duration 12 weeks    PT Treatment/Interventions ADLs/Self Care Home Management;Cryotherapy;Electrical Stimulation;Moist Heat;Therapeutic activities;Therapeutic exercise;Neuromuscular re-education;Patient/family education;Manual techniques;Joint Manipulations;Traction;Dry needling;Passive range of motion;Spinal Manipulations    PT Next Visit Plan Update HEP as appropriate, strengthening and manual therapy as appropriate.    PT Home Exercise Plan Medbridge Access Code: [email or web address]    Consulted and Agree with Plan of Care Patient           Patient will benefit from skilled therapeutic intervention in order to improve the following deficits and impairments:  Decreased coordination,Decreased activity tolerance,Decreased endurance,Decreased strength,Difficulty walking,Pain,Decreased mobility,Increased muscle spasms,Decreased range of motion,Hypomobility,Impaired perceived functional ability  Visit Diagnosis: Chronic left-sided low back pain with left-sided sciatica  Difficulty in walking, not elsewhere classified     Problem List Patient Active Problem List   Diagnosis Date Noted  . Varicose veins of both lower extremities with pain 07/19/2018  . Arthritis of right hand 12/21/2017  . Immunosuppressed status ([place]) 12/21/2017  . Major depression in remission ([place]) 12/21/2017  . Fibrocystic breast changes 06/22/2017  . Osteopenia 10/18/2016  . Atherosclerosis of aorta ([place]) 12/19/2015  . Vitreous degeneration 05/21/2015  . Glaucoma associated with chamber angle anomalies 05/21/2015  . Crohn's disease ([place]) 05/21/2015  . H/O malignant neoplasm of breast 05/21/2015  . Crohn's disease of both small and large intestine with rectal bleeding ([place]) 12/04/2014  . Solitary pulmonary nodule 11/07/2012  . 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 Cognitive deficits as late effect of cerebrovascular disease 01/20/2010  . CVA, old, hemiparesis ([place])  04/28/2009  . Adult hypothyroidism 06/12/2008  . Acid reflux 05/23/2007  . Hypercholesterolemia without hypertriglyceridemia 01/18/2007  . Benign essential HTN 01/18/2007    Everlean Alstrom. Graylon Good, PT, DPT 12/29/20, 12:08 PM  [facility] [address] [place], [address] Phone: [phone]   Fax:  [phone]  Name: Carolyn Campbell MRN: [id number] Date of Birth: [birth year]

## 2021-01-01 ENCOUNTER — Encounter: Payer: Self-pay | Admitting: Physical Therapy

## 2021-01-01 ENCOUNTER — Other Ambulatory Visit: Payer: Self-pay

## 2021-01-01 ENCOUNTER — Ambulatory Visit: Payer: Medicare Other | Admitting: Physical Therapy

## 2021-01-01 DIAGNOSIS — R262 Difficulty in walking, not elsewhere classified: Secondary | ICD-10-CM

## 2021-01-01 DIAGNOSIS — G8929 Other chronic pain: Secondary | ICD-10-CM

## 2021-01-01 DIAGNOSIS — M5442 Lumbago with sciatica, left side: Secondary | ICD-10-CM | POA: Diagnosis not present

## 2021-01-01 NOTE — Therapy (Signed)
[facility] [address], [place], [address] Phone: [phone]   Fax:  [phone]  Physical Therapy Treatment  Patient Details  Name: Carolyn Campbell MRN: [id number] Date of Birth: 1955-12-11 Referring Provider (PT): Jennings Books, MD (neurology)   Encounter Date: 01/01/2021   PT End of Session - 01/01/21 1125    Visit Number 9    Number of Visits 24    Date for PT Re-Evaluation 02/25/21    Authorization Type [employer] MEDICARE reporting period from 12/03/2020    Progress Note Due on Visit 10    PT Start Time 1120    PT Stop Time 1200    PT Time Calculation (min) 40 min    Activity Tolerance Patient tolerated treatment well    Behavior During Therapy [facility] for tasks assessed/performed           Past Medical History:  Diagnosis Date  . Abdominal pain, epigastric   . Allergic rhinitis   . Anginal pain ([place])    states MD said it was GERD  . Arthritis   . Asthma   . Breast cancer ([place]) 2006   LT LUMPECTOMY  . CD (Crohn's disease) ([place]) 05/21/2015   FOLLOWED BY GI   . Clotting disorder ([place])   . Cognitive deficits as late effect of cerebrovascular disease   . COPD (chronic obstructive pulmonary disease) ([place])   . Crohn's disease of both small and large intestine with rectal bleeding ([place]) 12/04/2014  . Depression    Currently taking zoloft.  . Dermatitis, eczematoid 05/21/2015  . 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 Dyspnea   . Esophageal reflux   . Fever blister 07/19/2018  . Glaucoma    vitreous degeneration  . History of kidney stones   . Hyperlipidemia   . Hypertension   . Hypothyroidism   . Occlusion, cerebral artery    NOS w/infarction  . Osteoporosis   . Personal history of radiation therapy 2006   BREAST CA  . Stroke ([place])   . Ulcer     Past Surgical History:  Procedure Laterality Date  . BREAST BIOPSY Left 2006   POS  . BREAST LUMPECTOMY Left 09/20/2004   positive   . BREAST SURGERY Left    malignant biopsy  . CARDIAC CATHETERIZATION    . COLONOSCOPY  2015  . COLONOSCOPY WITH PROPOFOL N/A 06/08/2017   Procedure: COLONOSCOPY WITH PROPOFOL;  Surgeon: Lin Landsman, MD;  Location: [place];  Service: Gastroenterology;  Laterality: N/A;  . COLONOSCOPY WITH PROPOFOL N/A 03/08/2019   Procedure: COLONOSCOPY WITH PROPOFOL;  Surgeon: Lin Landsman, MD;  Location[facility] ENDOSCOPY;  Service: Gastroenterology;  Laterality: N/A;  . COLONOSCOPY WITH PROPOFOL N/A 07/16/2020   Procedure: COLONOSCOPY WITH PROPOFOL;  Surgeon: Lin Landsman, MD;  Location: [facility] ENDOSCOPY;  Service: Gastroenterology;  Laterality: N/A;  . ESOPHAGOGASTRODUODENOSCOPY  2015  . ESOPHAGOGASTRODUODENOSCOPY (EGD) WITH PROPOFOL N/A 06/08/2017   Procedure: ESOPHAGOGASTRODUODENOSCOPY (EGD) WITH PROPOFOL;  Surgeon: Lin Landsman, MD;  Location: [place];  Service: Gastroenterology;  Laterality: N/A;  . ESOPHAGOGASTRODUODENOSCOPY (EGD) WITH PROPOFOL N/A 03/08/2019   Procedure: ESOPHAGOGASTRODUODENOSCOPY (EGD) WITH PROPOFOL;  Surgeon: Lin Landsman, MD;  Location: [facility] ENDOSCOPY;  Service: Gastroenterology;  Laterality: N/A;  . FINGER SURGERY     Right small finger  . FRACTURE SURGERY    . [place] STUDY  2015  . TUBAL LIGATION      There were  no vitals filed for this visit.   Subjective Assessment - 01/01/21 1122    Subjective Patient reports she is feeling well. States her left leg stil feels different, but she does not consider it pain. Has been doing her HEP. Felt okay after last session but does feel she has a little lingering pain after nerve glide.    Pertinent History Patient is a 65 y.o. female who presents to outpatient physical therapy with a referral for medical diagnosis low back pain. This patient's chief complaints consist of left leg pain from the hip to the ankle and occasional left low back pain leading to the following functional  deficits: difficulty with usual activities including sleeping (wakes her up even though she takes sleeping medication), prolonged standing, prolonged sitting, prolonged walking, stair, housework, cleaning, etc. Relevant past medical history and comorbidities include history of CVA (2008, affects left side mostly LE), HTN, osteoporosis, Crohn's disease (currently gets infusions), osteopenia, glaucoma, cataract removal, hx of depression (weaning medications with MD supervision), breast cancer (2006), clotting disorder, hypothyroidism, cardiac catheterization. Patient denies hx of seizures, lung problem, diabetes, unexplained weight loss, changes in bowel or bladder problems, new onset stumbling or dropping things apart from history (L leg feels like it gives out more since leg pain).    Limitations Sitting;Lifting;Walking;Standing;House hold activities;Other (comment)   Functional Limitations: difficulty with usual activities including sleeping (wakes her up even though she takes sleeping medication), prolonged standing, prolonged sitting, prolonged walking, stair, housework, cleaning, etc.   Diagnostic tests MRI report 11/02/2020: "IMPRESSION:  Lumbar spondylosis as outlined.  At L4-L5, there is mild/moderate disc degeneration. Grade 1  anterolisthesis. Posterior annular fissure. Disc uncovering with  disc bulge. Moderate facet arthrosis with mild ligamentum flavum  hypertrophy. Trace right facet joint effusion. Bilateral neural  foraminal narrowing (moderate right, mild left).  No more than mild spinal canal or neural foraminal narrowing at the  remaining levels.  Edema and enhancement within the right L4 and L5 pedicles/articular  pillars, which may be degenerative or may reflect stress reaction.  Degenerative marrow edema and enhancement along the right L4-L5  facet joint."    Currently in Pain? No/denies             OBJECTIVE FOTO = 58 (12/29/2020)   TREATMENT:  Therapeutic exercise:to centralize  symptoms and improve ROM, strength, muscular endurance, and activity tolerance required for successful completion of functional activities. - left side glide at wall (R side toward wall),1x20 - supine sciatic nerve glide, 1x15 each side (harder on L) - hooklying abdominal brace with alternating LE extension, 1x15 each side - hooklying abdominal brace with LE bicycle, 3x7 with cuing for breathing.   - quadruped bird dog 3x10 - glute pull through with 15# cable, 3x10 - tandem stance ball toss, 3x10 each side with pink theraball (2kg ball x2 hit patient in abdomen twice - too hard to control today)  - reverse lunge with BUE support on treadmill bar and airex behind to tap knee on, 2x10 each side.  - squat to tap on 18 inch chair, 1x5 - Education on HEP including handout   Pt required multimodal cuing for proper technique and to facilitate improved neuromuscular control, strength, range of motion, and functional ability resulting in improved performance and form.  HOME EXERCISE PROGRAM Access Code: [email or web address] URL: [email or web address] Date: 01/01/2021 Prepared by: Rosita Kea  Exercises Right Standing Lateral Shift Correction at [place] - 3 x daily - 20 reps - or when pain is present -  1 sets Supine 90/90 Sciatic Nerve Glide with Knee Flexion/Extension - 1-2 x daily - 15 reps Supine Bicycles - 1 x daily - 3 sets - 7 reps Bridge with Hip Abduction and Resistance - 1 x daily - 1 sets - 20 reps - 2-5 seconds hold Single Leg Stance - 1 x daily - 3 x weekly - 2 reps - 30 seconds hold Runner's Step Up/Down - 1 x daily - 3 x weekly - 3 sets - 10 reps Bird Dog - 3 x weekly - 3 sets - 10 reps Squat with Chair Touch    PT Education - 01/01/21 1125    Education Details Exercise purpose/form. Self management techniques    Person(s) Educated Patient    Methods Explanation;Demonstration;Tactile cues;Verbal cues    Comprehension Verbalized understanding;Returned  demonstration;Verbal cues required;Tactile cues required            PT Short Term Goals - 12/08/20 1038      PT SHORT TERM GOAL #1   Title Be independent with initial home exercise program for self-management of symptoms.    Baseline Initial HEP provided at IE (12/03/2020);    Time 2    Period Weeks    Status Achieved    Target Date 12/17/20             PT Long Term Goals - 12/03/20 2101      PT LONG TERM GOAL #1   Title Be independent with a long-term home exercise program for self-management of symptoms.    Baseline Initial HEP provided at IE (12/03/2020);    Time 12    Period Weeks    Status New   TARGET DATE FOR ALL LONG TERM GOALS: 02/25/2021     PT LONG TERM GOAL #2   Title Demonstrate improved FOTO score to equal or greater than 63 by visit #10 to demonstrate improvement in overall condition and self-reported functional ability.    Baseline 56 (12/03/2020);    Time 12    Period Weeks    Status New      PT LONG TERM GOAL #3   Title Have full lumbar AROM with no compensations or increase in pain in all planes except intermittent end range discomfort to allow patient to complete valued activities with less difficulty.    Baseline painful and limited - see objective exam (12/03/2020);    Time 12    Period Weeks    Status New      PT LONG TERM GOAL #4   Title Reduce pain with functional activities to equal or less than 1/10 to allow patient to complete usual activities including ADLs, IADLs, and social engagement with less difficulty.    Baseline up to 7/10 (12/03/2020);    Time 12    Period Weeks    Status New      PT LONG TERM GOAL #5   Title Complete community, work and/or recreational activities without limitation due to current condition.    Baseline Functional Limitations: usual activities including sleeping (wakes her up even though she takes sleeping medication), prolonged standing, prolonged sitting, prolonged walking, stair, housework, cleaning, etc  (12/03/2020);    Time 12    Period Weeks    Status New                 Plan - 01/01/21 1211    Clinical Impression Statement Patient tolerated treatment well overall with no increase in pain. Continues to advance core and functional strength exercises. Patient would  benefit from continued management of limiting condition by skilled physical therapist to address remaining impairments and functional limitations to work towards stated goals and return to PLOF or maximal functional independence.    Personal Factors and Comorbidities Comorbidity 3+;Time since onset of injury/illness/exacerbation;Age;Past/Current Experience    Comorbidities Relevant past medical history and comorbidities include history of CVA (2008, affects left side mostly LE), HTN, osteoporosis, Crohn's disease (currently gets infusions), osteopenia, glaucoma, cataract removal, hx of depression (weaning medications with MD supervision), breast cancer (2006), clotting disorder, hypothyroidism, cardiac catheterization    Examination-Activity Limitations Locomotion Level;Transfers;Stand;Lift;Bed Mobility;Dressing;Squat;Stairs;Sleep;Carry;Sit;Bend;Caring for Others    Examination-Participation Restrictions Community Activity;Meal Prep;Interpersonal Relationship   difficulty with usual activities including sleeping (wakes her up even though she takes sleeping medication), prolonged standing, prolonged sitting, prolonged walking, stair, housework, cleaning, etc.   Stability/Clinical Decision Making Evolving/Moderate complexity    Rehab Potential Good    PT Frequency 2x / week    PT Duration 12 weeks    PT Treatment/Interventions ADLs/Self Care Home Management;Cryotherapy;Electrical Stimulation;Moist Heat;Therapeutic activities;Therapeutic exercise;Neuromuscular re-education;Patient/family education;Manual techniques;Joint Manipulations;Traction;Dry needling;Passive range of motion;Spinal Manipulations    PT Next Visit Plan Update HEP  as appropriate, strengthening and manual therapy as appropriate.    PT Home Exercise Plan Medbridge Access Code: [email or web address]    Consulted and Agree with Plan of Care Patient           Patient will benefit from skilled therapeutic intervention in order to improve the following deficits and impairments:  Decreased coordination,Decreased activity tolerance,Decreased endurance,Decreased strength,Difficulty walking,Pain,Decreased mobility,Increased muscle spasms,Decreased range of motion,Hypomobility,Impaired perceived functional ability  Visit Diagnosis: Chronic left-sided low back pain with left-sided sciatica  Difficulty in walking, not elsewhere classified     Problem List Patient Active Problem List   Diagnosis Date Noted  . Varicose veins of both lower extremities with pain 07/19/2018  . Arthritis of right hand 12/21/2017  . Immunosuppressed status ([place]) 12/21/2017  . Major depression in remission ([place]) 12/21/2017  . Fibrocystic breast changes 06/22/2017  . Osteopenia 10/18/2016  . Atherosclerosis of aorta ([place]) 12/19/2015  . Vitreous degeneration 05/21/2015  . Glaucoma associated with chamber angle anomalies 05/21/2015  . Crohn's disease ([place]) 05/21/2015  . H/O malignant neoplasm of breast 05/21/2015  . Crohn's disease of both small and large intestine with rectal bleeding ([place]) 12/04/2014  . Solitary pulmonary nodule 11/07/2012  . 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 Cognitive deficits as late effect of cerebrovascular disease 01/20/2010  . CVA, old, hemiparesis ([place]) 04/28/2009  . Adult hypothyroidism 06/12/2008  . Acid reflux 05/23/2007  . Hypercholesterolemia without hypertriglyceridemia 01/18/2007  . Benign essential HTN 01/18/2007    Everlean Alstrom. Graylon Good, PT, DPT 01/01/21, 12:11 PM  [facility] [address], [place], [address] Phone: [phone]   Fax:   [phone]  Name: Carolyn Campbell MRN: [id number] Date of Birth: 02/20/1956

## 2021-01-05 ENCOUNTER — Ambulatory Visit: Payer: Medicare Other | Admitting: Physical Therapy

## 2021-01-08 ENCOUNTER — Other Ambulatory Visit: Payer: Self-pay

## 2021-01-08 ENCOUNTER — Encounter: Payer: Self-pay | Admitting: Physical Therapy

## 2021-01-08 ENCOUNTER — Ambulatory Visit: Payer: Medicare Other | Admitting: Physical Therapy

## 2021-01-08 DIAGNOSIS — R262 Difficulty in walking, not elsewhere classified: Secondary | ICD-10-CM

## 2021-01-08 DIAGNOSIS — M5442 Lumbago with sciatica, left side: Secondary | ICD-10-CM | POA: Diagnosis not present

## 2021-01-08 DIAGNOSIS — G8929 Other chronic pain: Secondary | ICD-10-CM

## 2021-01-08 NOTE — Therapy (Signed)
[facility] [address] [place], [address] Phone: [phone]   Fax:  [phone]  Physical Therapy Treatment / Discharge Summary Dates of reporting: 12/03/2020 - 01/08/2021  Patient Details  Name: Carolyn Campbell MRN: [id number] Date of Birth: 04-Apr-1956 Referring Provider (PT): Jennings Books, MD (neurology)   Encounter Date: 01/08/2021   PT End of Session - 01/08/21 1147    Visit Number 10    Number of Visits 24    Date for PT Re-Evaluation 02/25/21    Authorization Type [employer] MEDICARE reporting period from 12/03/2020    Progress Note Due on Visit 10    PT Start Time 1120    PT Stop Time 1135    PT Time Calculation (min) 15 min    Activity Tolerance Patient tolerated treatment well    Behavior During Therapy [facility] for tasks assessed/performed           Past Medical History:  Diagnosis Date  . Abdominal pain, epigastric   . Allergic rhinitis   . Anginal pain ([place])    states MD said it was GERD  . Arthritis   . Asthma   . Breast cancer ([place]) 2006   LT LUMPECTOMY  . CD (Crohn's disease) ([place]) 05/21/2015   FOLLOWED BY GI   . Clotting disorder ([place])   . Cognitive deficits as late effect of cerebrovascular disease   . COPD (chronic obstructive pulmonary disease) ([place])   . Crohn's disease of both small and large intestine with rectal bleeding ([place]) 12/04/2014  . Depression    Currently taking zoloft.  . Dermatitis, eczematoid 05/21/2015  . 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 Dyspnea   . Esophageal reflux   . Fever blister 07/19/2018  . Glaucoma    vitreous degeneration  . History of kidney stones   . Hyperlipidemia   . Hypertension   . Hypothyroidism   . Occlusion, cerebral artery    NOS w/infarction  . Osteoporosis   . Personal history of radiation therapy 2006   BREAST CA  . Stroke ([place])   . Ulcer     Past Surgical History:  Procedure Laterality Date  . BREAST BIOPSY  Left 2006   POS  . BREAST LUMPECTOMY Left 09/20/2004   positive  . BREAST SURGERY Left    malignant biopsy  . CARDIAC CATHETERIZATION    . COLONOSCOPY  2015  . COLONOSCOPY WITH PROPOFOL N/A 06/08/2017   Procedure: COLONOSCOPY WITH PROPOFOL;  Surgeon: Lin Landsman, MD;  Location: [facility];  Service: Gastroenterology;  Laterality: N/A;  . COLONOSCOPY WITH PROPOFOL N/A 03/08/2019   Procedure: COLONOSCOPY WITH PROPOFOL;  Surgeon: Lin Landsman, MD;  Location: [facility] ENDOSCOPY;  Service: Gastroenterology;  Laterality: N/A;  . COLONOSCOPY WITH PROPOFOL N/A 07/16/2020   Procedure: COLONOSCOPY WITH PROPOFOL;  Surgeon: Lin Landsman, MD;  Location: [facility] ENDOSCOPY;  Service: Gastroenterology;  Laterality: N/A;  . ESOPHAGOGASTRODUODENOSCOPY  2015  . ESOPHAGOGASTRODUODENOSCOPY (EGD) WITH PROPOFOL N/A 06/08/2017   Procedure: ESOPHAGOGASTRODUODENOSCOPY (EGD) WITH PROPOFOL;  Surgeon: Lin Landsman, MD;  Location: [facility];  Service: Gastroenterology;  Laterality: N/A;  . ESOPHAGOGASTRODUODENOSCOPY (EGD) WITH PROPOFOL N/A 03/08/2019   Procedure: ESOPHAGOGASTRODUODENOSCOPY (EGD) WITH PROPOFOL;  Surgeon: Lin Landsman, MD;  Location[facility] ENDOSCOPY;  Service: Gastroenterology;  Laterality: N/A;  . FINGER SURGERY     Right small finger  . FRACTURE SURGERY    . [place] STUDY  2015  .  TUBAL LIGATION      There were no vitals filed for this visit.   Subjective Assessment - 01/08/21 1121    Subjective Patient reports her pain has been up to 7/10 in the last two weeks, described as limited time in the morning after she got new exercises over a week ago. Otherwise states her pain gets a little achy upt o 2/10 when she has stood for a long time, but it's "not bad".  Reports she feels good today and things are going well in general. Patient reports she is doing well with her HEP at home. States she feels like her constant pain is gone and she is able to use  exercises to manage pain she gets sometimes. She reports she feels ready to discharge from PT at this time to long term HEP.    Pertinent History Patient is a 65 y.o. female who presents to outpatient physical therapy with a referral for medical diagnosis low back pain. This patient's chief complaints consist of left leg pain from the hip to the ankle and occasional left low back pain leading to the following functional deficits: difficulty with usual activities including sleeping (wakes her up even though she takes sleeping medication), prolonged standing, prolonged sitting, prolonged walking, stair, housework, cleaning, etc. Relevant past medical history and comorbidities include history of CVA (2008, affects left side mostly LE), HTN, osteoporosis, Crohn's disease (currently gets infusions), osteopenia, glaucoma, cataract removal, hx of depression (weaning medications with MD supervision), breast cancer (2006), clotting disorder, hypothyroidism, cardiac catheterization. Patient denies hx of seizures, lung problem, diabetes, unexplained weight loss, changes in bowel or bladder problems, new onset stumbling or dropping things apart from history (L leg feels like it gives out more since leg pain).    Limitations Sitting;Lifting;Walking;Standing;House hold activities;Other (comment)   Functional Limitations: difficulty with usual activities including sleeping (wakes her up even though she takes sleeping medication), prolonged standing, prolonged sitting, prolonged walking, stair, housework, cleaning, etc.   Diagnostic tests MRI report 11/02/2020: "IMPRESSION:  Lumbar spondylosis as outlined.  At L4-L5, there is mild/moderate disc degeneration. Grade 1  anterolisthesis. Posterior annular fissure. Disc uncovering with  disc bulge. Moderate facet arthrosis with mild ligamentum flavum  hypertrophy. Trace right facet joint effusion. Bilateral neural  foraminal narrowing (moderate right, mild left).  No more than mild  spinal canal or neural foraminal narrowing at the  remaining levels.  Edema and enhancement within the right L4 and L5 pedicles/articular  pillars, which may be degenerative or may reflect stress reaction.  Degenerative marrow edema and enhancement along the right L4-L5  facet joint."    Currently in Pain? No/denies    Effect of Pain on Daily Activities Functional Limitations: sleeping (wakes her up even though she takes sleeping medication), prolonged standing, prolonged sitting, prolonged walking, stair, housework, cleaning, etc (12/03/2020); No longer being awoken at night or taking sleeping medication, and significant improvements in other areas (01/08/2021).          OBJECTIVE   FOTO = 72  SPINE MOTION Lumbar AROM *Indicates pain  Flexion: = ankles (discomfort at end range at posterior L knee, no worse).   Extension: = 100%, no pain  Rotation: R = 100% no pain, L = 100% no pain  Side Flexion: R = WFL, L = WFL    TREATMENT:  Therapeutic exercise:to centralize symptoms and improve ROM, strength, muscular endurance, and activity tolerance required for successful completion of functional activities. - measurements to assess progress (see above) - education  on discharge reccomendations   HOME EXERCISE PROGRAM Access Code: [email or web address] URL: [email or web address] Date: 01/01/2021 Prepared by: Rosita Kea  Exercises Right Standing Lateral Shift Correction at Wall - Repetitions - 3 x daily - 20 reps - or when pain is present - 1 sets Supine 90/90 Sciatic Nerve Glide with Knee Flexion/Extension - 1-2 x daily - 15 reps Supine Bicycles - 1 x daily - 3 sets - 7 reps Bridge with Hip Abduction and Resistance - 1 x daily - 1 sets - 20 reps - 2-5 seconds hold Single Leg Stance - 1 x daily - 3 x weekly - 2 reps - 30 seconds hold Runner's Step Up/Down - 1 x daily - 3 x weekly - 3 sets - 10 reps Bird Dog - 3 x weekly - 3 sets - 10 reps Squat with Chair Touch     PT  Education - 01/08/21 1148    Education Details Self management techniques. Discharge reccomendations.    Person(s) Educated Patient    Methods Explanation    Comprehension Verbalized understanding            PT Short Term Goals - 12/08/20 1038      PT SHORT TERM GOAL #1   Title Be independent with initial home exercise program for self-management of symptoms.    Baseline Initial HEP provided at IE (12/03/2020);    Time 2    Period Weeks    Status Achieved    Target Date 12/17/20             PT Long Term Goals - 01/08/21 1148      PT LONG TERM GOAL #1   Title Be independent with a long-term home exercise program for self-management of symptoms.    Baseline Initial HEP provided at IE (12/03/2020); currently participating in appropriate long term HEP (01/08/2021);    Time 12    Period Weeks    Status Achieved   TARGET DATE FOR ALL LONG TERM GOALS: 02/25/2021     PT LONG TERM GOAL #2   Title Demonstrate improved FOTO score to equal or greater than 63 by visit #10 to demonstrate improvement in overall condition and self-reported functional ability.    Baseline 56 (12/03/2020); 58 (12/17/2020); 72 (01/08/2021);    Time 12    Period Weeks    Status Achieved      PT LONG TERM GOAL #3   Title Have full lumbar AROM with no compensations or increase in pain in all planes except intermittent end range discomfort to allow patient to complete valued activities with less difficulty.    Baseline painful and limited - see objective exam (12/03/2020); no longer painful or limited (01/08/2021);    Time 12    Period Weeks    Status Achieved      PT LONG TERM GOAL #4   Title Reduce pain with functional activities to equal or less than 1/10 to allow patient to complete usual activities including ADLs, IADLs, and social engagement with less difficulty.    Baseline up to 7/10 (12/03/2020); up to 2/10 in the last 7 days (01/08/2021);    Time 12    Period Weeks    Status Partially Met      PT LONG  TERM GOAL #5   Title Complete community, work and/or recreational activities without limitation due to current condition.    Baseline Functional Limitations: usual activities including sleeping (wakes her up even though she takes sleeping medication), prolonged standing, prolonged sitting, prolonged walking,  stair, housework, cleaning, etc (12/03/2020); No longer being awoken at night or taking sleeping medication, and significant improvements in other areas to patient satisfaction (01/08/2021).    Time 12    Period Weeks    Status Achieved                 Plan - 01/08/21 1145    Clinical Impression Statement Patient attended 10 physical therapy sessions and made great progress towards goals, meeting all of them to the patient's satisfaction and adequate for discharge at this time. FOTO score improved from 56 to 72 which is significantly above predicted score of 63 and demonstrates excellent improvement in self-reported function. Patient did not feel she needed any further guidance in the clinic today and elected to participate in a short session. Patient is now discharged from physical therapy to independent management with long term HEP.    Personal Factors and Comorbidities Comorbidity 3+;Time since onset of injury/illness/exacerbation;Age;Past/Current Experience    Comorbidities Relevant past medical history and comorbidities include history of CVA (2008, affects left side mostly LE), HTN, osteoporosis, Crohn's disease (currently gets infusions), osteopenia, glaucoma, cataract removal, hx of depression (weaning medications with MD supervision), breast cancer (2006), clotting disorder, hypothyroidism, cardiac catheterization    Examination-Activity Limitations Locomotion Level;Transfers;Stand;Lift;Bed Mobility;Dressing;Squat;Stairs;Sleep;Carry;Sit;Bend;Caring for Others    Examination-Participation Restrictions Community Activity;Meal Prep;Interpersonal Relationship   difficulty with usual  activities including sleeping (wakes her up even though she takes sleeping medication), prolonged standing, prolonged sitting, prolonged walking, stair, housework, cleaning, etc.   Stability/Clinical Decision Making Evolving/Moderate complexity    Rehab Potential Good    PT Frequency 2x / week    PT Duration 12 weeks    PT Treatment/Interventions ADLs/Self Care Home Management;Cryotherapy;Electrical Stimulation;Moist Heat;Therapeutic activities;Therapeutic exercise;Neuromuscular re-education;Patient/family education;Manual techniques;Joint Manipulations;Traction;Dry needling;Passive range of motion;Spinal Manipulations    PT Next Visit Plan [name] is now discharged from PT to independnet management due to improvement in condition.    PT Home Exercise Plan Medbridge Access Code: [email or web address]    Consulted and Agree with Plan of Care Patient           Patient will benefit from skilled therapeutic intervention in order to improve the following deficits and impairments:  Decreased coordination,Decreased activity tolerance,Decreased endurance,Decreased strength,Difficulty walking,Pain,Decreased mobility,Increased muscle spasms,Decreased range of motion,Hypomobility,Impaired perceived functional ability  Visit Diagnosis: Chronic left-sided low back pain with left-sided sciatica  Difficulty in walking, not elsewhere classified     Problem List Patient Active Problem List   Diagnosis Date Noted  . Varicose veins of both lower extremities with pain 07/19/2018  . Arthritis of right hand 12/21/2017  . Immunosuppressed status ([place]) 12/21/2017  . Major depression in remission ([place]) 12/21/2017  . Fibrocystic breast changes 06/22/2017  . Osteopenia 10/18/2016  . Atherosclerosis of aorta ([place]) 12/19/2015  . Vitreous degeneration 05/21/2015  . Glaucoma associated with chamber angle anomalies 05/21/2015  . Crohn's disease ([place]) 05/21/2015  . H/O malignant neoplasm of breast 05/21/2015  . Crohn's  disease of both small and large intestine with rectal bleeding ([place]) 12/04/2014  . Solitary pulmonary nodule 11/07/2012  . 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 Cognitive deficits as late effect of cerebrovascular disease 01/20/2010  . CVA, old, hemiparesis ([place]) 04/28/2009  . Adult hypothyroidism 06/12/2008  . Acid reflux 05/23/2007  . Hypercholesterolemia without hypertriglyceridemia 01/18/2007  . Benign essential HTN 01/18/2007    Everlean Alstrom. Graylon Good, PT, DPT 01/08/21, 11:50 AM  [facility] [address], [place], [address] Phone: [phone]   Fax:  [phone]  Name: Carolyn Campbell MRN: [id number] Date of Birth: 01/10/1956

## 2021-01-19 ENCOUNTER — Encounter: Payer: Medicare Other | Admitting: Physical Therapy

## 2021-01-21 ENCOUNTER — Encounter: Payer: Medicare Other | Admitting: Physical Therapy

## 2021-01-22 ENCOUNTER — Ambulatory Visit (INDEPENDENT_AMBULATORY_CARE_PROVIDER_SITE_OTHER): Payer: Medicare Other | Admitting: Gastroenterology

## 2021-01-22 ENCOUNTER — Encounter: Payer: Self-pay | Admitting: Gastroenterology

## 2021-01-22 ENCOUNTER — Encounter: Payer: Self-pay | Admitting: Rheumatology

## 2021-01-22 ENCOUNTER — Other Ambulatory Visit: Payer: Self-pay

## 2021-01-22 VITALS — BP 120/68 | HR 71 | Temp 97.4°F | Ht 60.0 in | Wt 145.1 lb

## 2021-01-22 DIAGNOSIS — K5010 Crohn's disease of large intestine without complications: Secondary | ICD-10-CM

## 2021-01-22 DIAGNOSIS — Z79899 Other long term (current) drug therapy: Secondary | ICD-10-CM | POA: Insufficient documentation

## 2021-01-22 MED ORDER — OMEPRAZOLE 20 MG PO CPDR: 20.0000 mg | DELAYED_RELEASE_CAPSULE | Freq: Every day | ORAL | 0 refills | Status: DC

## 2021-01-22 MED ORDER — SHINGRIX 50 MCG/0.5ML IM SUSR: 0.5000 mL | Freq: Once | INTRAMUSCULAR | 0 refills | Status: AC

## 2021-01-22 NOTE — Progress Notes (Signed)
[name], MD [address]  [place]  [place], [place] [address]  Main: [phone]  Fax: [phone]    Gastroenterology Consultation  Referring Provider:     Delsa Grana, PA-C Primary Care Physician:  Delsa Grana, PA-C Primary Gastroenterologist:  Dr. [name] Reason for Consultation: Crohn's Disease        HPI:   Carolyn Campbell is a 65 y.o. African-American female referred by Dr. Delsa Grana, PA-C  for consultation & management of Crohn's disease. This was diagnosed in 2015, currently on Imuran 50 mg daily. She reports intermittent flareups of her Crohn's disease such as abdominal pain, bloating, rectal bleeding. She received prednisone for flare up. She had 2 flareups within last 1 year. She recently is suffering with constipation, had to take laxatives to have a BM. Her last BM was 3 days ago. She does report some upper abdominal bloating. She denies any weight loss, rectal bleeding, diarrhea, nausea, vomiting. She is not on any prednisone currently. She denies taking NSAIDs.  Follow up visit 10/24/18 She thinks she has flare up of crohn's as she has mild abdominal discomfort that limited her PO intake. She continues to have atypical chest discomfort for which she is taking tums. She mentioned about chest pain during last visit, I urged her to see her PCP which she did not yet  Follow-up visit 11/06/2018 Since last visit, her symptoms worsened.  Therefore, patient made an urgent visit to see me today.  She reports upper abdominal pain limiting her p.o. intake, results in nausea, describes it as burning.  She is taking Tums and Prilosec as needed which provides some relief. She denies diarrhea.  She lost about 6 pounds in last 3 weeks.  She denies fever, chills.  She is accompanied by her husband today.  I was planning to start her on immunomodulator, she underwent screening for TB and hepatitis B, which were negative.  TPMT levels were normal.  Follow-up visit  11/20/2018 She reports feeling significantly better, reports mild pain in the epigastric region.  She started taking azathioprine 50 mg daily.  She tried prednisone 40 mg for 2 days then developed itching associated with rash in her hands, she felt itching might be secondary to prednisone so stopped taking it.  Rash currently resolved.  Eating well.  CT abdomen and pelvis revealed possible cystitis, UA showed leukocytes and she was given ceftriaxone at her PCPs office and given Bactrim twice daily for 5 days.  Urine cultures are negative  Follow-up visit 03/27/2019 Patient underwent EGD and colonoscopy.  EGD was unremarkable, colonoscopy revealed mild to moderate active colitis.  Patient continues to have mild symptoms of abdominal discomfort, blood mixed with stool and on wiping.  She denies diarrhea, weight loss.  In fact, she gained few pounds.  She feels tired, does not feel normal in her stomach. She is currently on azathioprine 50 mg daily.  Applied for [employer] as patient did not chose Humira as she does not prefer self injection.  She was previously on Remicade  Follow-up visit 05/29/2019 Patient received first dose of Entyvio on 05/23/2019 in [place].  She tolerated it well without any side effects.  She noticed improvement in her stool consistency as well as minimal rectal bleeding.  She has gained few pounds as well.  Today, she is concerned about 1 week history of left leg pain associated with some swelling and tenderness.  She denies fever, chills.  She continues to take azathioprine 50  mg daily.  She finished prednisone course  Follow-up video visit 10/08/2019 Carolyn Campbell has history of Crohn's disease of the terminal ileum and colon diagnosed in 2015, previously maintained on Imuran, Remicade and intermittent prednisone use.  Colonoscopy 02/2019 revealed recurrence of active Crohn's colitis, started on Entyvio monotherapy.  Patient has been in clinical remission since then.  Currently off  azathioprine.  She reports having bowel movement every other day.  Reports feeling tired.  She denies abdominal pain, nausea or vomiting.  She had gained about 10 pounds since last visit.  Follow-up visit 01/07/2020 Carolyn Campbell reports that she has been in her best health in a while since starting Entyvio.  She is tolerating Entyvio very well, receiving it every 8 weeks.  Her most recent labs including fecal calprotectin levels, CRP, CBC, CMP were unremarkable.  Her weight has been stable.  She denies any GI symptoms today  Follow-up visit 01/22/2021 Patient is here for annual follow-up of her Crohn's disease.  She is doing very well with regards to her Crohn's.  Tolerating Entyvio well, receiving every 8 weeks.  Most recent labs are unremarkable.  She had bone density testing which revealed osteopenia.  Her past vitamin D levels were normal.  She is concerned about weight gain although she gained about 4 pounds within last 1 year only.  Her TSH has been normal  Crohn's disease classification:  Age: > 40 Location: ileocolonic  Behavior: non stricturing, non penetrating  Perianal: no  IBD diagnosis: 11/2013  Disease course:Crohn's disease in 2015, initial symptoms were abdominal pain, blood in stool and on wiping, bloating. She had a colonoscopy, VCE, EGD at the time of diagnosis. She was initially on mesalamine 4 pills daily, prednisone later switched to Remicade in 11/2014. She took only 5 doses as she could not afford. Then she was switched to Imuran 50 mg daily. She had mild flareups about twice a year. Colonoscopy 05/2017 revealed mild ileocolonic disease primarily on histology. She self discontinued Imuran. Lialda started in 06/2017.  Possible exacerbation of Crohn's disease with elevated fecal calprotectin in 10/2018.  CT abdomen and pelvis unremarkable.  Discontinued Lialda, started on azathioprine 50 mg daily.  Flareup of Crohn's in 10/2018, confirmed on colonoscopy and elevated fecal calprotectin  levels.  Short prednisone course and started Entyvio on 05/23/2019 Discontinued azathioprine in 09/2019.  Normal fecal calprotectin levels, normal CRP in 09/2019  Extra intestinal manifestations: None  IBD surgical history: None No family history of IBD Imaging:  MRE none CTE none SBFT none  Procedures: Colonoscopy 07/16/2020 - The examined portion of the ileum was normal. - The entire examined colon is normal. Biopsied. - The distal rectum and anal verge are normal on retroflexion view. - Simple Endoscopic Score for Crohn's Disease: 0, mucosal inflammatory changes secondary to quiescent Crohn's disease. DIAGNOSIS:  A. COLON POLYP, CECUM; COLD SNARE:  - HYPERPLASTIC POLYP.  - NEGATIVE FOR DYSPLASIA AND MALIGNANCY.   B. COLON, RIGHT; COLD BIOPSY:  - FOCAL MILD CHRONIC INACTIVE COLITIS CONSISTENT WITH PATIENT'S KNOWN  HISTORY OF CROHN'S DISEASE.  - NEGATIVE FOR DYSPLASIA AND MALIGNANCY.   C. COLON, LEFT; COLD BIOPSY:  - MILD CHRONIC INACTIVE COLITIS CONSISTENT WITH PATIENT'S KNOWN HISTORY  OF CROHN'S DISEASE.  - NEGATIVE FOR DYSPLASIA AND MALIGNANCY.   D. RECTUM; COLD BIOPSY:  - UNREMARKABLE COLONIC TYPE MUCOSA.  - NEGATIVE FOR DYSPLASIA AND MALIGNANCY.   Colonoscopy : 01/28/2005, showed external hemorrhoids only Colonoscopy 12/10/2013, showed normal terminal ileum, biopsies performed. Skipped areas of nonbleeding  ulcerative mucosa were seen in the entire colon, biopsies performed. 4 mm polyp in the sigmoid colon Pathology: Rectal biopsy mild-to-moderate active proctitis with architectural features of chronicity, negative for dysplasia and malignancy. Terminal ileum: Small bowel mucosa with preserved villous architecture. Right colon biopsy: No significant pathologic changes  Upper Endoscopy 12/10/2013, underwent dilation of the esophagus Ampullary biopsy: Normal, gastric biopsy: Intramucosal with erosion and mild chronic gastritis, negative for H. pylori, dysplasia and  malignancy  VCE 05/06/2014: Erosions in the distal ileum  EGD and colonoscopy 03/08/2019  DIAGNOSIS:  A. DUODENUM POLYP; COLD BIOPSY:  - DUODENAL MUCOSA WITH PROMINENT BRUNNER'S GLAND AND REACTIVE CHANGES IN  OVERLYING VILLI.  - [place] ENTERITIS, DYSPLASIA, AND MALIGNANCY.   B. STOMACH, RANDOM; COLD BIOPSY:  - ANTRAL MUCOSA WITH CHANGES CONSISTENT WITH ENDOSCOPIC FINDING OF  ANTRAL EROSION.  - FRAGMENTS OF UNREMARKABLE OXYNTIC MUCOSA.  - NEGATIVE FOR H. PYLORI, DYSPLASIA, AND MALIGNANCY.   C. TERMINAL ILEUM; COLD BIOPSY:  - UNREMARKABLE SMALL INTESTINAL MUCOSA.  - NEGATIVE FOR ACTIVE ENTERITIS, DYSPLASIA, AND MALIGNANCY.   D. COLON, RANDOM RIGHT; COLD BIOPSY:  - MODERATE CHRONIC ACTIVE COLITIS CONSISTENT WITH PATIENT'S KNOWN  HISTORY OF CROHN'S.  - NEGATIVE FOR VIRAL CYTOPATHIC EFFECT, DYSPLASIA, AND MALIGNANCY.   E. COLON, RANDOM TRANSVERSE; COLD BIOPSY:  - MILD CHRONIC ACTIVE COLITIS INVOLVING A MINORITY OF THE BIOPSY  FRAGMENTS.  - CHRONIC INACTIVE COLITIS INVOLVING THE MAJORITY OF THE BIOPSY  FRAGMENTS.  - NEGATIVE FOR VIRAL CYTOPATHIC EFFECT, DYSPLASIA, AND MALIGNANCY.   F. COLON, RANDOM LEFT; COLD BIOPSY:  - PATCHY MILD CHRONIC INACTIVE COLITIS.  - NEGATIVE FOR DYSPLASIA AND MALIGNANCY.   G. RECTUM, RANDOM PROCTITIS; COLD BIOPSY:  - MARKED CHRONIC ACTIVE PROCTITIS CONSISTENT WITH PATIENT'S KNOWN  HISTORY OF CROHN'S.  - NEGATIVE FOR VIRAL CYTOPATHIC EFFECT, DYSPLASIA, AND MALIGNANCY.  EGD 06/08/2017 Normal esophagus, small hiatal hernia, normal stomach and duodenum  Colonoscopy 06/08/2017 The perianal and digital rectal examinations were normal. Pertinent negatives include normal sphincter tone and no palpable rectal Lesions. The terminal ileum appeared normal. Biopsies were taken with a cold forceps for histology. Two scattered non-bleeding aphthae were found in the ascending colon. Rest of the colon and rectum  appeared normal. Random biopsies performed   IBD medications:  Steroids: Prednisone steroid responsive, budesonide 5-ASA: mesalamine, Lialda in the past Immunomodulators: azathioprine, discontinued in 2020 TPMT status normal Biologics:  Anti TNFs: Remicade monotherapy, received induction followed by 1 maintenance dose in 11/2014 Anti Integrins: Entyvio started on 05/23/2019 Ustekinumab: Tofactinib: Clinical trial:   Past Medical History:  Diagnosis Date  . Abdominal pain, epigastric   . Allergic rhinitis   . Anginal pain ([place])    states MD said it was GERD  . Arthritis   . Asthma   . Breast cancer ([place]) 2006   LT LUMPECTOMY  . CD (Crohn's disease) ([place]) 05/21/2015   FOLLOWED BY GI   . Clotting disorder ([place])   . Cognitive deficits as late effect of cerebrovascular disease   . COPD (chronic obstructive pulmonary disease) ([place])   . Crohn's disease of both small and large intestine with rectal bleeding ([place]) 12/04/2014  . Depression    Currently taking zoloft.  . Dermatitis, eczematoid 05/21/2015  . 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 Dyspnea   . Esophageal reflux   . Fever blister 07/19/2018  . Glaucoma    vitreous degeneration  . History of kidney stones   . Hyperlipidemia   .  Hypertension   . Hypothyroidism   . Occlusion, cerebral artery    NOS w/infarction  . Osteoporosis   . Personal history of radiation therapy 2006   BREAST CA  . Stroke ([place])   . Ulcer     Past Surgical History:  Procedure Laterality Date  . BREAST BIOPSY Left 2006   POS  . BREAST LUMPECTOMY Left 09/20/2004   positive  . BREAST SURGERY Left    malignant biopsy  . CARDIAC CATHETERIZATION    . COLONOSCOPY  2015  . COLONOSCOPY WITH PROPOFOL N/A 06/08/2017   Procedure: COLONOSCOPY WITH PROPOFOL;  Surgeon: Lin Landsman, MD;  Location: [facility];  Service: Gastroenterology;  Laterality: N/A;  . COLONOSCOPY WITH PROPOFOL N/A 03/08/2019   Procedure:  COLONOSCOPY WITH PROPOFOL;  Surgeon: Lin Landsman, MD;  Location: [facility] ENDOSCOPY;  Service: Gastroenterology;  Laterality: N/A;  . COLONOSCOPY WITH PROPOFOL N/A 07/16/2020   Procedure: COLONOSCOPY WITH PROPOFOL;  Surgeon: Lin Landsman, MD;  Location: [facility] ENDOSCOPY;  Service: Gastroenterology;  Laterality: N/A;  . ESOPHAGOGASTRODUODENOSCOPY  2015  . ESOPHAGOGASTRODUODENOSCOPY (EGD) WITH PROPOFOL N/A 06/08/2017   Procedure: ESOPHAGOGASTRODUODENOSCOPY (EGD) WITH PROPOFOL;  Surgeon: Lin Landsman, MD;  Location: [facility];  Service: Gastroenterology;  Laterality: N/A;  . ESOPHAGOGASTRODUODENOSCOPY (EGD) WITH PROPOFOL N/A 03/08/2019   Procedure: ESOPHAGOGASTRODUODENOSCOPY (EGD) WITH PROPOFOL;  Surgeon: Lin Landsman, MD;  Location: [facility] ENDOSCOPY;  Service: Gastroenterology;  Laterality: N/A;  . FINGER SURGERY     Right small finger  . FRACTURE SURGERY    . [place] STUDY  2015  . TUBAL LIGATION       Current Outpatient Medications:  .  hydrochlorothiazide (HYDRODIURIL) 12.5 MG tablet, Take by mouth., Disp: , Rfl:  .  latanoprost (XALATAN) 0.005 % ophthalmic solution, Place 1 drop into both eyes at bedtime. , Disp: , Rfl:  .  levothyroxine (SYNTHROID) 50 MCG tablet, Take 1 tablet (50 mcg total) by mouth daily. On empty stomach, Disp: 90 tablet, Rfl: 3 .  losartan-hydrochlorothiazide (HYZAAR) 50-12.5 MG tablet, Take 0.5 tablets by mouth daily. D/c HCTZ, Disp: 45 tablet, Rfl: 3 .  Multiple Vitamin (MULTIVITAMIN) tablet, Take 1 tablet by mouth daily., Disp: , Rfl:  .  omeprazole (PRILOSEC) 20 MG capsule, Take 1 capsule (20 mg total) by mouth daily., Disp: 90 capsule, Rfl: 0 .  rosuvastatin (CRESTOR) 20 MG tablet, Take 1 tablet (20 mg total) by mouth at bedtime., Disp: 90 tablet, Rfl: 3 .  sertraline (ZOLOFT) 25 MG tablet, Take 0.5 tablets (12.5 mg total) by mouth daily., Disp: 45 tablet, Rfl: 3 .  triamcinolone ointment (KENALOG) 0.1 %, Apply to affected skin  one to two times daily for up to two weeks, Disp: 30 g, Rfl: 0 .  valACYclovir (VALTREX) 1000 MG tablet, Take 1 tablet (1,000 mg total) by mouth 2 (two) times daily., Disp: 30 tablet, Rfl: 5 .  vedolizumab (ENTYVIO) 300 MG injection, Inject into the vein., Disp: , Rfl:  .  Zoster Vaccine Adjuvanted [facility]) injection, Inject 0.5 mLs into the muscle once for 1 dose., Disp: 0.5 mL, Rfl: 0   Family History  Problem Relation Age of Onset  . Heart disease Mother   . Heart attack Mother   . Breast cancer Sister   . Alcohol abuse Father   . Cerebrovascular Accident Father   . Arthritis Father   . Hypertension Father   . Hypertension Other   . Diabetes Other   . Breast cancer Sister 59  .  Heart attack Other 41  . Cancer Sister      Social History   Tobacco Use  . Smoking status: Never Smoker  . Smokeless tobacco: Never Used  Vaping Use  . Vaping Use: Never used  Substance Use Topics  . Alcohol use: No    Alcohol/week: 0.0 standard drinks  . Drug use: No    Allergies as of 01/22/2021  . (No Known Allergies)    Review of Systems:    All systems reviewed and negative except where noted in HPI.   Physical Exam:  BP 120/68 (BP Location: Left Arm, Patient Position: Sitting, Cuff Size: Normal)   Pulse 71   Temp (!) 97.4 F (36.3 C) (Oral)   Ht 5' (1.524 m)   Wt 145 lb 2 oz (65.8 kg)   BMI 28.34 kg/m  No LMP recorded. Patient is postmenopausal.  General:   Alert,  Well-developed, well-nourished, pleasant and cooperative in NAD Head:  Normocephalic and atraumatic. Eyes:  Sclera clear, no icterus.   Conjunctiva pink. Ears:  Normal auditory acuity. Nose:  No deformity, discharge, or lesions. Mouth:  No deformity or lesions,oropharynx pink & moist. Neck:  Supple; no masses or thyromegaly. Lungs:  Respirations even and unlabored.  Clear throughout to auscultation.   No wheezes, crackles, or rhonchi. No acute distress. Heart:  Regular rate and rhythm; no murmurs, clicks,  rubs, or gallops. Abdomen:  Normal bowel sounds.  No bruits.  Soft, nontender, and non-distended without masses, hepatosplenomegaly or hernias noted.  No guarding or rebound tenderness.   Rectal: Nor performed Msk:  Symmetrical without gross deformities. Good, equal movement & strength bilaterally. Pulses:  Normal pulses noted. Extremities:  No clubbing or edema.  No cyanosis. Neurologic:  Alert and oriented x3;  grossly normal neurologically. Skin:  Intact without significant lesions or rashes. No jaundice. Psych:  Alert and cooperative. Normal mood and affect.  Imaging Studies: MRI reviewed  Assessment and Plan:   Carolyn Campbell is a 65 y.o. African-American female with Terminal ileal and colonic Crohn's, diagnosed in 2015 who was previously maintained on Imuran 50 mg daily with mild intermittent flareups about twice a year needing prednisone. She does not have any endoscopy evidence of ileocolonic Crohn's disease other than a few scattered aphthae in the ascending colon on colonoscopy in 05/2017 but histology revealed chronic mild active ileitis and right-sided colitis. MR enterography did not inflammation in small bowel. I tried to give her budesonide but she could not afford high co-pay of $500 for 30 days supply.  She stopped Imuran by herself in the past.  Recent flareup of her Crohn's disease with worsening of upper abdominal pain associated with nausea, unintentional weight loss, poor p.o. intake. Fecal calprotectin levels are elevated.  Discontinued Lialda, currently on azathioprine 50 mg daily.  She took prednisone for 2 days only due to new onset of rash and itching.  Rash has resolved.  EGD was unremarkable.  Repeat colonoscopy revealed active Crohn's colitis, worse in the rectum with mild symptoms.  Started on Entyvio on 05/23/2019, currently in clinical and histologic remission.  Crohn's disease of large intestine, nonstricturing, nonpenetrating Normal fecal calprotectin levels, normal  CRP, currently in clinical and histologic remission -Currently on Entyvio monotherapy -CBC, LFTs with every other infusion  Chronic GERD, well controlled EGD in 2020 was normal Currently on omeprazole 40 mg daily Decreased to 20 mg daily for 1 month, then stop  IBD Health Maintenance  1.TB status: Gold quantiferon negative 09/2019 2. Anemia: normal  Hb, vitamin B12 and ferritin levels are normal 3.Immunizations: Hep A immune, hepatitis B immune, and annual influenza vaccine, prevnar received in 06/2017, pneumovax administered on 03/27/2019.  Received first dose of Shingrix vaccine on 04/11/2019, due for booster, prescription given 4.Cancer screening I) Colon cancer/dysplasia surveillance: Colonoscopy 07/16/2020, no evidence of dysplasia and no precancerous polyps identified, recommend surveillance colonoscopy in 5 years.  II) Cervical cancer: n/a III) Skin cancer - n/a  5.Bone health Vitamin D status: Normal Bone density testing: On 09/25/2020, revealed osteopenia Normal vitamin D levels in the past 5. Labs: Every with every other infusion 6. Smoking: Never smoked 7. NSAIDs and Antibiotics use: Denies NSAID use and antibiotic use  Follow up annually  [name], MD

## 2021-01-29 ENCOUNTER — Encounter: Payer: Self-pay | Admitting: Family Medicine

## 2021-01-29 ENCOUNTER — Other Ambulatory Visit: Payer: Self-pay

## 2021-01-29 ENCOUNTER — Ambulatory Visit (INDEPENDENT_AMBULATORY_CARE_PROVIDER_SITE_OTHER): Payer: Medicare Other | Admitting: Family Medicine

## 2021-01-29 VITALS — BP 120/70 | HR 88 | Temp 98.1°F | Resp 16 | Ht 60.0 in | Wt 147.6 lb

## 2021-01-29 DIAGNOSIS — I10 Essential (primary) hypertension: Secondary | ICD-10-CM

## 2021-01-29 DIAGNOSIS — H02402 Unspecified ptosis of left eyelid: Secondary | ICD-10-CM

## 2021-01-29 DIAGNOSIS — Z23 Encounter for immunization: Secondary | ICD-10-CM | POA: Diagnosis not present

## 2021-01-29 DIAGNOSIS — I69359 Hemiplegia and hemiparesis following cerebral infarction affecting unspecified side: Secondary | ICD-10-CM

## 2021-01-29 DIAGNOSIS — E7800 Pure hypercholesterolemia, unspecified: Secondary | ICD-10-CM

## 2021-01-29 DIAGNOSIS — E039 Hypothyroidism, unspecified: Secondary | ICD-10-CM | POA: Diagnosis not present

## 2021-01-29 DIAGNOSIS — Z5181 Encounter for therapeutic drug level monitoring: Secondary | ICD-10-CM

## 2021-01-29 DIAGNOSIS — F325 Major depressive disorder, single episode, in full remission: Secondary | ICD-10-CM

## 2021-01-29 DIAGNOSIS — I700 Atherosclerosis of aorta: Secondary | ICD-10-CM

## 2021-01-29 DIAGNOSIS — I69922 Dysarthria following unspecified cerebrovascular disease: Secondary | ICD-10-CM

## 2021-01-29 NOTE — Patient Instructions (Signed)
Health Maintenance  Topic Date Due  . Flu Shot  04/20/2021  . Mammogram  10/22/2021  . Tetanus Vaccine  03/28/2022  . DEXA scan (bone density measurement)  09/25/2022  . Colon Cancer Screening  07/16/2025  . COVID-19 Vaccine  Completed  . Hepatitis C Screening: USPSTF Recommendation to screen - Ages 6-67 yo.  Completed  . Pneumonia vaccines  Completed  . HPV Vaccine  Aged Out     Hypothyroidism  Hypothyroidism is when the thyroid gland does not make enough of certain hormones (it is underactive). The thyroid gland is a small gland located in the lower front part of the neck, just in front of the windpipe (trachea). This gland makes hormones that help control how the body uses food for energy (metabolism) as well as how the heart and brain function. These hormones also play a role in keeping your bones strong. When the thyroid is underactive, it produces too little of the hormones thyroxine (T4) and triiodothyronine (T3). What are the causes? This condition may be caused by:  Hashimoto's disease. This is a disease in which the body's disease-fighting system (immune system) attacks the thyroid gland. This is the most common cause.  Viral infections.  Pregnancy.  Certain medicines.  Birth defects.  Past radiation treatments to the head or neck for cancer.  Past treatment with radioactive iodine.  Past exposure to radiation in the environment.  Past surgical removal of part or all of the thyroid.  Problems with a gland in the center of the brain (pituitary gland).  Lack of enough iodine in the diet. What increases the risk? You are more likely to develop this condition if:  You are female.  You have a family history of thyroid conditions.  You use a medicine called lithium.  You take medicines that affect the immune system (immunosuppressants). What are the signs or symptoms? Symptoms of this condition include:  Feeling as though you have no energy  (lethargy).  Not being able to tolerate cold.  Weight gain that is not explained by a change in diet or exercise habits.  Lack of appetite.  Dry skin.  Coarse hair.  Menstrual irregularity.  Slowing of thought processes.  Constipation.  Sadness or depression. How is this diagnosed? This condition may be diagnosed based on:  Your symptoms, your medical history, and a physical exam.  Blood tests. You may also have imaging tests, such as an ultrasound or MRI. How is this treated? This condition is treated with medicine that replaces the thyroid hormones that your body does not make. After you begin treatment, it may take several weeks for symptoms to go away. Follow these instructions at home:  Take over-the-counter and prescription medicines only as told by your health care provider.  If you start taking any new medicines, tell your health care provider.  Keep all follow-up visits as told by your health care provider. This is important. ? As your condition improves, your dosage of thyroid hormone medicine may change. ? You will need to have blood tests regularly so that your health care provider can monitor your condition. Contact a health care provider if:  Your symptoms do not get better with treatment.  You are taking thyroid hormone replacement medicine and you: ? Sweat a lot. ? Have tremors. ? Feel anxious. ? Lose weight rapidly. ? Cannot tolerate heat. ? Have emotional swings. ? Have diarrhea. ? Feel weak. Get help right away if you have:  Chest pain.  An irregular heartbeat.  A rapid heartbeat.  Difficulty breathing. Summary  Hypothyroidism is when the thyroid gland does not make enough of certain hormones (it is underactive).  When the thyroid is underactive, it produces too little of the hormones thyroxine (T4) and triiodothyronine (T3).  The most common cause is Hashimoto's disease, a disease in which the body's disease-fighting system (immune  system) attacks the thyroid gland. The condition can also be caused by viral infections, medicine, pregnancy, or past radiation treatment to the head or neck.  Symptoms may include weight gain, dry skin, constipation, feeling as though you do not have energy, and not being able to tolerate cold.  This condition is treated with medicine to replace the thyroid hormones that your body does not make. This information is not intended to replace advice given to you by your health care provider. Make sure you discuss any questions you have with your health care provider. Document Revised: 06/06/2020 Document Reviewed: 05/22/2020 Elsevier Patient Education  2021 [employer].

## 2021-01-29 NOTE — Progress Notes (Signed)
Patient ID: Carolyn Campbell, female    DOB: 1956/06/30, 65 y.o.   MRN: [id number]  PCP: Delsa Grana, PA-C  Chief Complaint  Patient presents with  . Referral    Left eye drooping, (Dr.Nice)    Subjective:   Carolyn Campbell is a 65 y.o. female, presents to clinic with CC of the following:  HPI  Eye droop, left eye, pt needs referral for insurance purposes No acute changes No vision changes, eye pain     Patient Active Problem List   Diagnosis Date Noted  . Other long term (current) drug therapy 01/22/2021  . Varicose veins of both lower extremities with pain 07/19/2018  . Arthritis of right hand 12/21/2017  . Immunosuppressed status ([place]) 12/21/2017  . Major depression in remission ([place]) 12/21/2017  . Fibrocystic breast changes 06/22/2017  . Osteopenia 10/18/2016  . Atherosclerosis of aorta ([place]) 12/19/2015  . Vitreous degeneration 05/21/2015  . Glaucoma associated with chamber angle anomalies 05/21/2015  . Crohn's disease ([place]) 05/21/2015  . H/O malignant neoplasm of breast 05/21/2015  . Crohn's disease of both small and large intestine with rectal bleeding ([place]) 12/04/2014  . Solitary pulmonary nodule 11/07/2012  . 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 Cognitive deficits as late effect of cerebrovascular disease 01/20/2010  . CVA, old, hemiparesis ([place]) 04/28/2009  . Adult hypothyroidism 06/12/2008  . Acid reflux 05/23/2007  . Hypercholesterolemia without hypertriglyceridemia 01/18/2007  . Benign essential HTN 01/18/2007      Current Outpatient Medications:  .  hydrochlorothiazide (HYDRODIURIL) 12.5 MG tablet, Take by mouth., Disp: , Rfl:  .  latanoprost (XALATAN) 0.005 % ophthalmic solution, Place 1 drop into both eyes at bedtime. , Disp: , Rfl:  .  levothyroxine (SYNTHROID) 50 MCG tablet, Take 1 tablet (50 mcg total) by mouth daily. On empty stomach, Disp: 90 tablet, Rfl: 3 .  losartan-hydrochlorothiazide (HYZAAR) 50-12.5 MG  tablet, Take 0.5 tablets by mouth daily. D/c HCTZ, Disp: 45 tablet, Rfl: 3 .  Multiple Vitamin (MULTIVITAMIN) tablet, Take 1 tablet by mouth daily., Disp: , Rfl:  .  omeprazole (PRILOSEC) 20 MG capsule, Take 1 capsule (20 mg total) by mouth daily., Disp: 90 capsule, Rfl: 0 .  rosuvastatin (CRESTOR) 20 MG tablet, Take 1 tablet (20 mg total) by mouth at bedtime., Disp: 90 tablet, Rfl: 3 .  sertraline (ZOLOFT) 25 MG tablet, Take 0.5 tablets (12.5 mg total) by mouth daily., Disp: 45 tablet, Rfl: 3 .  triamcinolone ointment (KENALOG) 0.1 %, Apply to affected skin one to two times daily for up to two weeks, Disp: 30 g, Rfl: 0 .  valACYclovir (VALTREX) 1000 MG tablet, Take 1 tablet (1,000 mg total) by mouth 2 (two) times daily., Disp: 30 tablet, Rfl: 5 .  vedolizumab (ENTYVIO) 300 MG injection, Inject into the vein., Disp: , Rfl:    No Known Allergies   Social History   Tobacco Use  . Smoking status: Never Smoker  . Smokeless tobacco: Never Used  Vaping Use  . Vaping Use: Never used  Substance Use Topics  . Alcohol use: No    Alcohol/week: 0.0 standard drinks  . Drug use: No      Chart Review Today: I personally reviewed active problem list, medication list, allergies, family history, social history, health maintenance, notes from last encounter, lab results, imaging with the patient/caregiver today.   Review of Systems  Constitutional: Positive for unexpected weight change. Negative for activity change, appetite change, chills, diaphoresis, fatigue and fever.  HENT: Negative.   Eyes: Negative.   Respiratory: Negative.   Cardiovascular: Negative.   Gastrointestinal: Negative.   Endocrine: Negative.   Genitourinary: Negative.   Musculoskeletal: Negative.   Skin: Negative.   Allergic/Immunologic: Negative.   Neurological: Negative.   Hematological: Negative.   Psychiatric/Behavioral: Negative.   All other systems reviewed and are negative.      Objective:   Vitals:    01/29/21 1513  BP: 120/70  Pulse: 88  Resp: 16  Temp: 98.1 F (36.7 C)  SpO2: 98%  Weight: 147 lb 9.6 oz (67 kg)  Height: 5' (1.524 m)    Body mass index is 28.83 kg/m.  Physical Exam Vitals and nursing note reviewed.  Constitutional:      General: She is not in acute distress.    Appearance: Normal appearance. She is well-developed. She is not ill-appearing, toxic-appearing or diaphoretic.     Interventions: Face mask in place.  HENT:     Head: Normocephalic and atraumatic.     Right Ear: External ear normal.     Left Ear: External ear normal.  Eyes:     General: Lids are normal. No scleral icterus.       Right eye: No discharge.        Left eye: No discharge.     Conjunctiva/sclera: Conjunctivae normal.  Neck:     Trachea: Phonation normal. No tracheal deviation.  Cardiovascular:     Rate and Rhythm: Normal rate and regular rhythm.     Pulses: Normal pulses.          Radial pulses are 2+ on the right side and 2+ on the left side.       Posterior tibial pulses are 2+ on the right side and 2+ on the left side.     Heart sounds: Normal heart sounds. No murmur heard. No friction rub. No gallop.   Pulmonary:     Effort: Pulmonary effort is normal. No respiratory distress.     Breath sounds: Normal breath sounds. No stridor. No wheezing, rhonchi or rales.  Chest:     Chest wall: No tenderness.  Abdominal:     General: Bowel sounds are normal. There is no distension.     Palpations: Abdomen is soft.  Musculoskeletal:     Right lower leg: No edema.     Left lower leg: No edema.  Skin:    General: Skin is warm and dry.     Coloration: Skin is not jaundiced or pale.     Findings: No rash.  Neurological:     Mental Status: She is alert.     Motor: No abnormal muscle tone.     Gait: Gait normal.  Psychiatric:        Mood and Affect: Mood normal.        Speech: Speech normal.        Behavior: Behavior normal.      Results for orders placed or performed in visit on  09/03/20  COMPLETE METABOLIC PANEL WITH GFR  Result Value Ref Range   Glucose, Bld 105 (H) 65 - 99 mg/dL   BUN 12 7 - 25 mg/dL   Creat 0.80 0.50 - 0.99 mg/dL   GFR, Est Non African American 77 > OR = 60 mL/min/1.34m   GFR, Est African American 89 > OR = 60 mL/min/1.723m  BUN/Creatinine Ratio NOT APPLICABLE 6 - 22 (calc)   Sodium 140 135 - 146 mmol/L   Potassium 4.2 3.5 - 5.3  mmol/L   Chloride 103 98 - 110 mmol/L   CO2 30 20 - 32 mmol/L   Calcium 9.7 8.6 - 10.4 mg/dL   Total Protein 6.9 6.1 - 8.1 g/dL   Albumin 4.3 3.6 - 5.1 g/dL   Globulin 2.6 1.9 - 3.7 g/dL (calc)   AG Ratio 1.7 1.0 - 2.5 (calc)   Total Bilirubin 0.6 0.2 - 1.2 mg/dL   Alkaline phosphatase (APISO) 71 37 - 153 U/L   AST 20 10 - 35 U/L   ALT 21 6 - 29 U/L  Lipid panel  Result Value Ref Range   Cholesterol 196 <200 mg/dL   HDL 89 > OR = 50 mg/dL   Triglycerides 66 <150 mg/dL   LDL Cholesterol (Calc) 92 mg/dL (calc)   Total CHOL/HDL Ratio 2.2 <5.0 (calc)   Non-HDL Cholesterol (Calc) 107 <130 mg/dL (calc)       Assessment & Plan:     ICD-10-CM   1. Adult hypothyroidism  E03.9 TSH   some weight gain w/o diet/lifestyle changes, ensure pt is euthyroid, labs and address meds if needed  2. Benign essential HTN  V77 COMPLETE METABOLIC PANEL WITH GFR   good med compliance, BP well controlled, at goal today  3. Hypercholesterolemia without hypertriglyceridemia  L39.03 COMPLETE METABOLIC PANEL WITH GFR    Lipid panel   good statin compliance, no SE or concerns, due for labs, continue meds if lipids at goal and continue diet/lifestyle efforts  4. Major depression in remission ([place])  F32.5 TSH   on zoloft, mood good, refill  5. CVA, old, hemiparesis ([place])  I69.359 Lipid panel   on meds for secondary prevention   6. Atherosclerosis of aorta ([facility])  I70.0 Lipid panel   no statin, monitoring  7. 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  I69.922 Lipid panel   stable  8. Drooping eyelid, left  H02.402  Ambulatory referral to Optometry    Ambulatory referral to Ophthalmology   changes in left eyelid and vision per pt - needs referral - Dr. Matilde Sprang and Dr. Lorina Rabon going to do consult and procedure to fix?  9. Need for shingles vaccine  Z23 Varicella-zoster vaccine IM (Shingrix)  10. Encounter for medication monitoring  Z51.81 CBC with Differential/Platelet    COMPLETE METABOLIC PANEL WITH GFR    Lipid panel    TSH   Depression screen [facility] 2/9 01/29/2021 09/03/2020 05/29/2020  Decreased Interest 0 0 0  Down, Depressed, Hopeless 0 0 0  PHQ - 2 Score 0 0 0  Altered sleeping 0 1 0  Tired, decreased energy 0 1 0  Change in appetite 0 0 0  Feeling bad or failure about yourself  0 0 0  Trouble concentrating 0 0 0  Moving slowly or fidgety/restless 0 0 0  Suicidal thoughts 0 0 0  PHQ-9 Score 0 2 0  Difficult doing work/chores Not difficult at all Not difficult at all Not difficult at all  Some recent data might be hidden   phq reviewed, neg, mood good, refill zoloft when needed    Pt going to f/up with Dr. Lorina Rabon in [place] (referral for this Dr as well?)  Delsa Grana, PA-C 01/29/21 3:19 PM

## 2021-01-30 ENCOUNTER — Other Ambulatory Visit: Payer: Self-pay | Admitting: Family Medicine

## 2021-01-30 DIAGNOSIS — I10 Essential (primary) hypertension: Secondary | ICD-10-CM

## 2021-01-30 DIAGNOSIS — E038 Other specified hypothyroidism: Secondary | ICD-10-CM

## 2021-01-30 DIAGNOSIS — E039 Hypothyroidism, unspecified: Secondary | ICD-10-CM

## 2021-01-30 DIAGNOSIS — Z0000 Encounter for general adult medical examination without abnormal findings: Secondary | ICD-10-CM

## 2021-01-30 DIAGNOSIS — E7800 Pure hypercholesterolemia, unspecified: Secondary | ICD-10-CM

## 2021-01-30 LAB — TSH: TSH: 0.73 mIU/L (ref 0.40–4.50)

## 2021-01-30 LAB — COMPLETE METABOLIC PANEL WITH GFR
AG Ratio: 1.7 (calc) (ref 1.0–2.5)
ALT: 20 U/L (ref 6–29)
AST: 20 U/L (ref 10–35)
Albumin: 4.4 g/dL (ref 3.6–5.1)
Alkaline phosphatase (APISO): 68 U/L (ref 37–153)
BUN: 16 mg/dL (ref 7–25)
CO2: 31 mmol/L (ref 20–32)
Calcium: 10.4 mg/dL (ref 8.6–10.4)
Chloride: 103 mmol/L (ref 98–110)
Creat: 0.89 mg/dL (ref 0.50–0.99)
GFR, Est African American: 78 mL/min/{1.73_m2} (ref >=60)
GFR, Est Non African American: 67 mL/min/{1.73_m2} (ref >=60)
Globulin: 2.6 g/dL (calc) (ref 1.9–3.7)
Glucose, Bld: 88 mg/dL (ref 65–99)
Potassium: 3.9 mmol/L (ref 3.5–5.3)
Sodium: 142 mmol/L (ref 135–146)
Total Bilirubin: 0.5 mg/dL (ref 0.2–1.2)
Total Protein: 7 g/dL (ref 6.1–8.1)

## 2021-01-30 LAB — CBC WITH DIFFERENTIAL/PLATELET
Absolute Monocytes: 383 cells/uL (ref 200–950)
Basophils Absolute: 50 cells/uL (ref 0–200)
Basophils Relative: 1.1 %
Eosinophils Absolute: 90 cells/uL (ref 15–500)
Eosinophils Relative: 2 %
HCT: 37.9 % (ref 35.0–45.0)
Hemoglobin: 12.3 g/dL (ref 11.7–15.5)
Lymphs Abs: 2196 cells/uL (ref 850–3900)
MCH: 28.9 pg (ref 27.0–33.0)
MCHC: 32.5 g/dL (ref 32.0–36.0)
MCV: 89.2 fL (ref 80.0–100.0)
MPV: 11 fL (ref 7.5–12.5)
Monocytes Relative: 8.5 %
Neutro Abs: 1782 cells/uL (ref 1500–7800)
Neutrophils Relative %: 39.6 %
Platelets: 214 10*3/uL (ref 140–400)
RBC: 4.25 10*6/uL (ref 3.80–5.10)
RDW: 12.4 % (ref 11.0–15.0)
Total Lymphocyte: 48.8 %
WBC: 4.5 10*3/uL (ref 3.8–10.8)

## 2021-01-30 LAB — LIPID PANEL
Cholesterol: 193 mg/dL (ref <200)
HDL: 89 mg/dL (ref >=50)
LDL Cholesterol (Calc): 85 mg/dL (calc)
Non-HDL Cholesterol (Calc): 104 mg/dL (calc) (ref <130)
Total CHOL/HDL Ratio: 2.2 (calc) (ref <5.0)
Triglycerides: 91 mg/dL (ref <150)

## 2021-01-30 MED ORDER — LEVOTHYROXINE SODIUM 50 MCG PO TABS: 50.0000 ug | ORAL_TABLET | Freq: Every day | ORAL | 3 refills | Status: DC

## 2021-01-30 MED ORDER — LOSARTAN POTASSIUM-HCTZ 50-12.5 MG PO TABS: 0.5000 | ORAL_TABLET | Freq: Every day | ORAL | 3 refills | Status: DC

## 2021-01-30 MED ORDER — ROSUVASTATIN CALCIUM 20 MG PO TABS: 20.0000 mg | ORAL_TABLET | Freq: Every day | ORAL | 3 refills | Status: DC

## 2021-02-06 ENCOUNTER — Encounter: Payer: Self-pay | Admitting: Family Medicine

## 2021-02-23 ENCOUNTER — Telehealth: Payer: Self-pay | Admitting: Gastroenterology

## 2021-02-23 DIAGNOSIS — H409 Unspecified glaucoma: Secondary | ICD-10-CM | POA: Insufficient documentation

## 2021-02-23 NOTE — Telephone Encounter (Signed)
Patient called and said she revceived a letter from her insurance company that they need a referral and auth for [facility], please advise.  Patient asks for call back

## 2021-02-23 NOTE — Telephone Encounter (Signed)
Sent a email to [place] with [facility] to find out what needed to be done

## 2021-03-05 ENCOUNTER — Ambulatory Visit: Payer: Medicare Other | Admitting: Family Medicine

## 2021-03-10 ENCOUNTER — Telehealth: Payer: Self-pay | Admitting: Gastroenterology

## 2021-03-10 NOTE — Telephone Encounter (Signed)
It looks like patient is getting medication from [facility]. Did PA on medication through cover my meds

## 2021-03-10 NOTE — Telephone Encounter (Signed)
Please call patient to discuss need for [facility]

## 2021-03-10 NOTE — Telephone Encounter (Signed)
Per Sharrie Rothman: I went back and looked and she had a referral with [place] in 2020 and couldn't afford her copay.  I'm wondering if she if either she is going outpatient and needs new order sent over or her insurance changed and may be covered now.  I would check with her and see if she is going to an outpatient infusion center that may just need updated script.  If that's not the case- certainly fax me new referral form and I will get her started again.  Thanks so much!   Called and left a message for call back

## 2021-03-11 NOTE — Telephone Encounter (Signed)
Informed patient that this medication was approved through her insurance company and she verbalized understanding. Asked if [facility]  needed a new prescription she said no she just needed a pa done through [facility]

## 2021-03-11 NOTE — Telephone Encounter (Signed)
Medication was approved through cover my meds.

## 2021-03-17 NOTE — Telephone Encounter (Signed)
error 

## 2021-04-20 DIAGNOSIS — U071 COVID-19: Secondary | ICD-10-CM

## 2021-04-20 HISTORY — DX: COVID-19: U07.1

## 2021-04-22 ENCOUNTER — Encounter: Payer: Self-pay | Admitting: Family Medicine

## 2021-04-22 ENCOUNTER — Telehealth (INDEPENDENT_AMBULATORY_CARE_PROVIDER_SITE_OTHER): Payer: Medicare Other | Admitting: Family Medicine

## 2021-04-22 ENCOUNTER — Other Ambulatory Visit: Payer: Self-pay

## 2021-04-22 DIAGNOSIS — U071 COVID-19: Secondary | ICD-10-CM | POA: Diagnosis not present

## 2021-04-22 MED ORDER — NIRMATRELVIR/RITONAVIR (PAXLOVID)TABLET: 3.0000 | ORAL_TABLET | Freq: Two times a day (BID) | ORAL | 0 refills | Status: AC

## 2021-04-22 NOTE — Patient Instructions (Addendum)
It was great to see you!  Our plans for today:  - See below for self-isolation guidelines. You may end your quarantine once you are 5 days from symptom onset and fever free for 24 hours without use of tylenol or ibuprofen. Wear a well-fitting N95 mask for an additional 5 days. - Take the paxlovid as directed. See HotterNames.de for more information about the medication. - Call your GI doctor to let them know about your infection and treatment with paxlovid. They may want to push out your next infusion. - Certainly, if you are having difficulties breathing, chest pain or unable to keep down fluids, go to the Emergency Department.   Take care and seek immediate care sooner if you develop any concerns.   Dr. Ky Barban     Person Under Monitoring Name: Carolyn Campbell  Location: [address] Lorina Rabon [facility] [address]   Infection Prevention Recommendations for Individuals Confirmed to have, or Being Evaluated for, 2019 Novel Coronavirus (COVID-19) Infection Who Receive Care at Home  Individuals who are confirmed to have, or are being evaluated for, COVID-19 should follow the prevention steps below until a healthcare provider or local or state health department says they can return to normal activities.  Stay home except to get medical care You should restrict activities outside your home, except for getting medical care. Do not go to work, school, or public areas, and do not use public transportation or taxis.  Call ahead before visiting your doctor Before your medical appointment, call the healthcare provider and tell them that you have, or are being evaluated for, COVID-19 infection. This will help the healthcare provider's office take steps to keep other people from getting infected. Ask your healthcare provider to call the local or state health department.  Monitor your symptoms Seek prompt medical attention if your illness is worsening (e.g.,  difficulty breathing). Before going to your medical appointment, call the healthcare provider and tell them that you have, or are being evaluated for, COVID-19 infection. Ask your healthcare provider to call the local or state health department.  Wear a facemask You should wear a facemask that covers your nose and mouth when you are in the same room with other people and when you visit a healthcare provider. People who live with or visit you should also wear a facemask while they are in the same room with you.  Separate yourself from other people in your home As much as possible, you should stay in a different room from other people in your home. Also, you should use a separate bathroom, if available.  Avoid sharing household items You should not share dishes, drinking glasses, cups, eating utensils, towels, bedding, or other items with other people in your home. After using these items, you should wash them thoroughly with soap and water.  Cover your coughs and sneezes Cover your mouth and nose with a tissue when you cough or sneeze, or you can cough or sneeze into your sleeve. Throw used tissues in a lined trash can, and immediately wash your hands with soap and water for at least 20 seconds or use an alcohol-based hand rub.  Wash your [facility] your hands often and thoroughly with soap and water for at least 20 seconds. You can use an alcohol-based hand sanitizer if soap and water are not available and if your hands are not visibly dirty. Avoid touching your eyes, nose, and mouth with unwashed hands.   Prevention Steps for Caregivers and Household Members of Individuals  Confirmed to have, or Being Evaluated for, COVID-19 Infection Being Cared for in the Home  If you live with, or provide care at home for, a person confirmed to have, or being evaluated for, COVID-19 infection please follow these guidelines to prevent infection:  Follow healthcare provider's instructions Make  sure that you understand and can help the patient follow any healthcare provider instructions for all care.  Provide for the patient's basic needs You should help the patient with basic needs in the home and provide support for getting groceries, prescriptions, and other personal needs.  Monitor the patient's symptoms If they are getting sicker, call his or her medical provider and tell them that the patient has, or is being evaluated for, COVID-19 infection. This will help the healthcare provider's office take steps to keep other people from getting infected. Ask the healthcare provider to call the local or state health department.  Limit the number of people who have contact with the patient If possible, have only one caregiver for the patient. Other household members should stay in another home or place of residence. If this is not possible, they should stay in another room, or be separated from the patient as much as possible. Use a separate bathroom, if available. Restrict visitors who do not have an essential need to be in the home.  Keep older adults, very young children, and other sick people away from the patient Keep older adults, very young children, and those who have compromised immune systems or chronic health conditions away from the patient. This includes people with chronic heart, lung, or kidney conditions, diabetes, and cancer.  Ensure good ventilation Make sure that shared spaces in the home have good air flow, such as from an air conditioner or an opened window, weather permitting.  Wash your hands often Wash your hands often and thoroughly with soap and water for at least 20 seconds. You can use an alcohol based hand sanitizer if soap and water are not available and if your hands are not visibly dirty. Avoid touching your eyes, nose, and mouth with unwashed hands. Use disposable paper towels to dry your hands. If not available, use dedicated cloth towels and replace  them when they become wet.  Wear a facemask and gloves Wear a disposable facemask at all times in the room and gloves when you touch or have contact with the patient's blood, body fluids, and/or secretions or excretions, such as sweat, saliva, sputum, nasal mucus, vomit, urine, or feces.  Ensure the mask fits over your nose and mouth tightly, and do not touch it during use. Throw out disposable facemasks and gloves after using them. Do not reuse. Wash your hands immediately after removing your facemask and gloves. If your personal clothing becomes contaminated, carefully remove clothing and launder. Wash your hands after handling contaminated clothing. Place all used disposable facemasks, gloves, and other waste in a lined container before disposing them with other household waste. Remove gloves and wash your hands immediately after handling these items.  Do not share dishes, glasses, or other household items with the patient Avoid sharing household items. You should not share dishes, drinking glasses, cups, eating utensils, towels, bedding, or other items with a patient who is confirmed to have, or being evaluated for, COVID-19 infection. After the person uses these items, you should wash them thoroughly with soap and water.  Wash laundry thoroughly Immediately remove and wash clothes or bedding that have blood, body fluids, and/or secretions or excretions, such as sweat,  saliva, sputum, nasal mucus, vomit, urine, or feces, on them. Wear gloves when handling laundry from the patient. Read and follow directions on labels of laundry or clothing items and detergent. In general, wash and dry with the warmest temperatures recommended on the label.  Clean all areas the individual has used often Clean all touchable surfaces, such as counters, tabletops, doorknobs, bathroom fixtures, toilets, phones, keyboards, tablets, and bedside tables, every day. Also, clean any surfaces that may have blood, body  fluids, and/or secretions or excretions on them. Wear gloves when cleaning surfaces the patient has come in contact with. Use a diluted bleach solution (e.g., dilute bleach with 1 part bleach and 10 parts water) or a household disinfectant with a label that says EPA-registered for coronaviruses. To make a bleach solution at home, add 1 tablespoon of bleach to 1 quart (4 cups) of water. For a larger supply, add  cup of bleach to 1 gallon (16 cups) of water. Read labels of cleaning products and follow recommendations provided on product labels. Labels contain instructions for safe and effective use of the cleaning product including precautions you should take when applying the product, such as wearing gloves or eye protection and making sure you have good ventilation during use of the product. Remove gloves and wash hands immediately after cleaning.  Monitor yourself for signs and symptoms of illness Caregivers and household members are considered close contacts, should monitor their health, and will be asked to limit movement outside of the home to the extent possible. Follow the monitoring steps for close contacts listed on the symptom monitoring form.   ? If you have additional questions, contact your local health department or call the epidemiologist on call at [phone] (available 24/7). ? This guidance is subject to change. For the most up-to-date guidance from [facility], please refer to their website: [email or web address]

## 2021-04-22 NOTE — Progress Notes (Addendum)
Virtual Visit Note  I connected with Carolyn Campbell on 04/22/21 at 11:40 AM EDT by phone and verified that I am speaking with the correct person using two identifiers.  Location: Patient: home Provider: [facility]   I discussed the limitations of evaluation and management by telemedicine and the availability of in person appointments. The patient expressed understanding and agreed to proceed.  History of Present Illness:  UPPER RESPIRATORY TRACT INFECTION - COVID + - symptom onset 7/31 - on entyvio for Crohn's, last infusion over a month ago.  Fever: no Cough: yes Shortness of breath: yes with walking Chest pain:  occasionally Chest tightness: yes Chest congestion: yes Nasal congestion: yes Runny nose: yes Sore throat: yes Headache: yes Ear pain: no  Ear pressure: no  Eyes red/itching:no Eye drainage/crusting: no  Vomiting: no Sick contacts: yes, husband also with COVID Relief with OTC cold/cough medications:  hasn't tried   Treatments attempted: tylenol    Observations/Objective:  Patient had trouble connecting to video visit, entirety of visit conducted over the phone.  Speaks in full sentences, no respiratory distress.   Assessment and Plan:  [id number] Doing well with moderate sx. Is a candidate for COVID treatment, discussed paxlovid and emergency authorization use, patient amenable to treatment, rx sent to pharmacy. Recommend withholding statin during treatment. Reviewed OTC symptom relief, self-quarantine guidelines, and emergency precautions.     I discussed the assessment and treatment plan with the patient. The patient was provided an opportunity to ask questions and all were answered. The patient agreed with the plan and demonstrated an understanding of the instructions.   The patient was advised to call back or seek an in-person evaluation if the symptoms worsen or if the condition fails to improve as anticipated.  I provided 19 minutes of non-face-to-face  time during this encounter.   Myles Gip, DO

## 2021-05-04 ENCOUNTER — Ambulatory Visit: Payer: Self-pay

## 2021-05-04 NOTE — Telephone Encounter (Signed)
Patient called and says she is having SOB with activity. She says she was diagnosed with COVID on 04/20/21 and had SOB at that time. She says she has an inhaler that she's been using, but not sure if it's working due to being expired. She says she hasn't really noticed if she's SOB while resting, but it's noticeable when she's up moving around or doing exercise. She says she notices a sinus drainage when lying down on one side vs the other, but no other symptoms. I advised an appointment, she asked if it could be a virtual appointment. MyChart video visit scheduled for tomorrow 05/05/21 at 1 with Delsa Grana, PA-C, care advice given, patient verbalized understanding.  Reason for Disposition  [1] MODERATE longstanding difficulty breathing (e.g., speaks in phrases, SOB even at rest, pulse 100-120) AND [2] SAME as normal  Answer Assessment - Initial Assessment Questions 1. RESPIRATORY STATUS: "Describe your breathing?" (e.g., wheezing, shortness of breath, unable to speak, severe coughing)      Shortness of breath, a little wheeze 2. ONSET: "When did this breathing problem begin?"      Since diagnosed with COVID on 04/22/21 3. PATTERN "Does the difficult breathing come and go, or has it been constant since it started?"      Constant if strenuous activity 4. SEVERITY: "How bad is your breathing?" (e.g., mild, moderate, severe)    - MILD: No SOB at rest, mild SOB with walking, speaks normally in sentences, can lie down, no retractions, pulse < 100.    - MODERATE: SOB at rest, SOB with minimal exertion and prefers to sit, cannot lie down flat, speaks in phrases, mild retractions, audible wheezing, pulse 100-120.    - SEVERE: Very SOB at rest, speaks in single words, struggling to breathe, sitting hunched forward, retractions, pulse > 120      Mild-moderate 5. RECURRENT SYMPTOM: "Have you had difficulty breathing before?" If Yes, ask: "When was the last time?" and "What happened that time?"      Yes, I have  asthma 6. CARDIAC HISTORY: "Do you have any history of heart disease?" (e.g., heart attack, angina, bypass surgery, angioplasty)      No 7. LUNG HISTORY: "Do you have any history of lung disease?"  (e.g., pulmonary embolus, asthma, emphysema)     Asthma 8. CAUSE: "What do you think is causing the breathing problem?"      Thinking it's from asthma 9. OTHER SYMPTOMS: "Do you have any other symptoms? (e.g., dizziness, runny nose, cough, chest pain, fever)     No 10. O2 SATURATION MONITOR:  "Do you use an oxygen saturation monitor (pulse oximeter) at home?" If Yes, "What is your reading (oxygen level) today?" "What is your usual oxygen saturation reading?" (e.g., 95%)       No 11. PREGNANCY: "Is there any chance you are pregnant?" "When was your last menstrual period?"       No 12. TRAVEL: "Have you traveled out of the country in the last month?" (e.g., travel history, exposures)       No  Protocols used: Breathing Difficulty-A-AH

## 2021-05-05 ENCOUNTER — Emergency Department: Payer: Medicare Other

## 2021-05-05 ENCOUNTER — Encounter: Payer: Self-pay | Admitting: Radiology

## 2021-05-05 ENCOUNTER — Emergency Department
Admission: EM | Admit: 2021-05-05 | Discharge: 2021-05-05 | Disposition: A | Discharge status: Home / Self Care | Payer: Medicare Other | Attending: Emergency Medicine | Admitting: Emergency Medicine

## 2021-05-05 ENCOUNTER — Other Ambulatory Visit: Payer: Self-pay

## 2021-05-05 ENCOUNTER — Encounter: Payer: Self-pay | Admitting: Family Medicine

## 2021-05-05 ENCOUNTER — Telehealth (INDEPENDENT_AMBULATORY_CARE_PROVIDER_SITE_OTHER): Payer: Medicare Other | Admitting: Family Medicine

## 2021-05-05 DIAGNOSIS — U071 COVID-19: Secondary | ICD-10-CM

## 2021-05-05 DIAGNOSIS — R0602 Shortness of breath: Secondary | ICD-10-CM | POA: Diagnosis present

## 2021-05-05 DIAGNOSIS — I2609 Other pulmonary embolism with acute cor pulmonale: Secondary | ICD-10-CM | POA: Insufficient documentation

## 2021-05-05 DIAGNOSIS — J45909 Unspecified asthma, uncomplicated: Secondary | ICD-10-CM | POA: Insufficient documentation

## 2021-05-05 DIAGNOSIS — Z7901 Long term (current) use of anticoagulants: Secondary | ICD-10-CM | POA: Diagnosis not present

## 2021-05-05 DIAGNOSIS — Z8616 Personal history of COVID-19: Secondary | ICD-10-CM | POA: Diagnosis not present

## 2021-05-05 DIAGNOSIS — J449 Chronic obstructive pulmonary disease, unspecified: Secondary | ICD-10-CM | POA: Diagnosis not present

## 2021-05-05 DIAGNOSIS — I10 Essential (primary) hypertension: Secondary | ICD-10-CM | POA: Insufficient documentation

## 2021-05-05 DIAGNOSIS — E039 Hypothyroidism, unspecified: Secondary | ICD-10-CM | POA: Insufficient documentation

## 2021-05-05 DIAGNOSIS — Z853 Personal history of malignant neoplasm of breast: Secondary | ICD-10-CM | POA: Insufficient documentation

## 2021-05-05 DIAGNOSIS — Z79899 Other long term (current) drug therapy: Secondary | ICD-10-CM | POA: Diagnosis not present

## 2021-05-05 DIAGNOSIS — I2699 Other pulmonary embolism without acute cor pulmonale: Secondary | ICD-10-CM

## 2021-05-05 LAB — BASIC METABOLIC PANEL
Anion gap: 10 (ref 5–15)
BUN: 16 mg/dL (ref 8–23)
CO2: 26 mmol/L (ref 22–32)
Calcium: 10.3 mg/dL (ref 8.9–10.3)
Chloride: 103 mmol/L (ref 98–111)
Creatinine, Ser: 0.83 mg/dL (ref 0.44–1.00)
GFR, Estimated: >60 mL/min (ref >60)
Glucose, Bld: 113 mg/dL — ABNORMAL HIGH (ref 70–99)
Potassium: 3.3 mmol/L — ABNORMAL LOW (ref 3.5–5.1)
Sodium: 139 mmol/L (ref 135–145)

## 2021-05-05 LAB — CBC
HCT: 38.3 % (ref 36.0–46.0)
Hemoglobin: 13.1 g/dL (ref 12.0–15.0)
MCH: 30.4 pg (ref 26.0–34.0)
MCHC: 34.2 g/dL (ref 30.0–36.0)
MCV: 88.9 fL (ref 80.0–100.0)
Platelets: 229 10*3/uL (ref 150–400)
RBC: 4.31 MIL/uL (ref 3.87–5.11)
RDW: 12.9 % (ref 11.5–15.5)
WBC: 7.5 10*3/uL (ref 4.0–10.5)
nRBC: 0 % (ref 0.0–0.2)

## 2021-05-05 LAB — TROPONIN I (HIGH SENSITIVITY)
Troponin I (High Sensitivity): 4 ng/L (ref <18)
Troponin I (High Sensitivity): 4 ng/L (ref <18)

## 2021-05-05 MED ORDER — IOHEXOL 350 MG/ML SOLN
80.0000 mL | Freq: Once | INTRAVENOUS | Status: AC | PRN
  Administered 2021-05-05: 80 mL via INTRAVENOUS
  Filled 2021-05-05: qty 80

## 2021-05-05 MED ORDER — APIXABAN 5 MG PO TABS: ORAL_TABLET | ORAL | 1 refills | Status: DC

## 2021-05-05 NOTE — ED Triage Notes (Signed)
Pt reports that she developed chest pain two weeks ago with [facility]. Denies any N/V or diaphoresis. She points mid sternal to where the chest pain is. Denies any radiation

## 2021-05-05 NOTE — Patient Instructions (Signed)
It was great to see you!  Our plans for today:  - I recommend you go to the Emergency Room to further look into your chest pain and trouble breathing.  Dr. Ky Barban

## 2021-05-05 NOTE — ED Provider Notes (Signed)
[facility] Emergency Department Provider Note   ____________________________________________    I have reviewed the triage vital signs and the nursing notes.   HISTORY  Chief Complaint Chest Pain     HPI Carolyn Campbell is a 65 y.o. female who presents with complaints of shortness of breath and chest discomfort for the last several weeks since being diagnosed with COVID-19.  However she notes that she becomes short of breath with exertion now which is atypical for her.  She does have a strong family history of heart disease as well.  Does not smoke cigarettes.  Currently feels well and has no complaints.  Referred by her family doctor for evaluation, she reports her COVID symptoms were very mild but she did have chest discomfort at that time  Past Medical History:  Diagnosis Date   Abdominal pain, epigastric    Allergic rhinitis    Anginal pain ([place])    states MD said it was GERD   Arthritis    Asthma    Breast cancer ([place]) 2006   LT LUMPECTOMY   CD (Crohn's disease) ([place]) 05/21/2015   FOLLOWED BY GI    Clotting disorder ([place])    Cognitive deficits as late effect of cerebrovascular disease    COPD (chronic obstructive pulmonary disease) ([place])    Crohn's disease of both small and large intestine with rectal bleeding ([place]) 12/04/2014   Depression    Currently taking zoloft.   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    Dyspnea    Esophageal reflux    Fever blister 07/19/2018   Glaucoma    vitreous degeneration   History of kidney stones    Hyperlipidemia    Hypertension    Hypothyroidism    Occlusion, cerebral artery    NOS w/infarction   Osteoporosis    Personal history of radiation therapy 2006   BREAST CA   Stroke [facility])    Ulcer     Patient Active Problem List   Diagnosis Date Noted   Glaucoma 02/23/2021   Other long term (current) drug therapy 01/22/2021   Varicose veins of both  lower extremities with pain 07/19/2018   Arthritis of right hand 12/21/2017   Immunosuppressed status ([place]) 12/21/2017   Major depression in remission ([place]) 12/21/2017   Fibrocystic breast changes 06/22/2017   Osteopenia 10/18/2016   Atherosclerosis of aorta ([place]) 12/19/2015   Vitreous degeneration 05/21/2015   Glaucoma associated with chamber angle anomalies 05/21/2015   Crohn's disease ([place]) 05/21/2015   H/O malignant neoplasm of breast 05/21/2015   Crohn's disease of both small and large intestine with rectal bleeding ([place]) 12/04/2014   Solitary pulmonary nodule 11/07/2012   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 04/22/2010   Cognitive deficits as late effect of cerebrovascular disease 01/20/2010   CVA, old, hemiparesis ([place]) 04/28/2009   Hypothyroidism 06/12/2008   Acid reflux 05/23/2007   Hypercholesterolemia 01/18/2007   Benign essential HTN 01/18/2007    Past Surgical History:  Procedure Laterality Date   BREAST BIOPSY Left 2006   POS   BREAST LUMPECTOMY Left 09/20/2004   positive   BREAST SURGERY Left    malignant biopsy   CARDIAC CATHETERIZATION     COLONOSCOPY  2015   COLONOSCOPY WITH PROPOFOL N/A 06/08/2017   Procedure: COLONOSCOPY WITH PROPOFOL;  Surgeon: Lin Landsman, MD;  Location: [place];  Service: Gastroenterology;  Laterality: N/A;   COLONOSCOPY WITH PROPOFOL N/A 03/08/2019  Procedure: COLONOSCOPY WITH PROPOFOL;  Surgeon: Lin Landsman, MD;  Location: [facility] ENDOSCOPY;  Service: Gastroenterology;  Laterality: N/A;   COLONOSCOPY WITH PROPOFOL N/A 07/16/2020   Procedure: COLONOSCOPY WITH PROPOFOL;  Surgeon: Lin Landsman, MD;  Location: [facility] ENDOSCOPY;  Service: Gastroenterology;  Laterality: N/A;   ESOPHAGOGASTRODUODENOSCOPY  2015   ESOPHAGOGASTRODUODENOSCOPY (EGD) WITH PROPOFOL N/A 06/08/2017   Procedure: ESOPHAGOGASTRODUODENOSCOPY (EGD) WITH PROPOFOL;  Surgeon: Lin Landsman, MD;  Location: [place];   Service: Gastroenterology;  Laterality: N/A;   ESOPHAGOGASTRODUODENOSCOPY (EGD) WITH PROPOFOL N/A 03/08/2019   Procedure: ESOPHAGOGASTRODUODENOSCOPY (EGD) WITH PROPOFOL;  Surgeon: Lin Landsman, MD;  Location: [facility] ENDOSCOPY;  Service: Gastroenterology;  Laterality: N/A;   FINGER SURGERY     Right small finger   FRACTURE SURGERY     GIVENS CAPSULE STUDY  2015   TUBAL LIGATION      Prior to Admission medications   Medication Sig Start Date End Date Taking? Authorizing Provider  apixaban (ELIQUIS) 5 MG TABS tablet Take 2 tablets (38m) twice daily for 7 days, then 1 tablet (552m twice daily 05/05/21  Yes KiLavonia DraftsMD  latanoprost (XALATAN) 0.005 % ophthalmic solution Place 1 drop into both eyes at bedtime.  04/22/16   [provider]  levothyroxine (SYNTHROID) 50 MCG tablet Take 1 tablet (50 mcg total) by mouth daily before breakfast. On empty stomach 01/30/21   TaDelsa GranaPA-C  losartan-hydrochlorothiazide (HYZAAR) 50-12.5 MG tablet Take 0.5 tablets by mouth daily. D/c HCTZ 01/30/21   TaDelsa GranaPA-C  Multiple Vitamin (MULTIVITAMIN) tablet Take 1 tablet by mouth daily.    [provider]  omeprazole (PRILOSEC) 20 MG capsule Take 1 capsule (20 mg total) by mouth daily. 01/22/21 04/22/21  VaLin LandsmanMD  pregabalin (LYRICA) 25 MG capsule Take 25 mg by mouth 3 (three) times daily. 04/20/21 04/20/22  [provider]  rosuvastatin (CRESTOR) 20 MG tablet Take 1 tablet (20 mg total) by mouth at bedtime. 01/30/21   TaDelsa GranaPA-C  sertraline (ZOLOFT) 25 MG tablet Take 0.5 tablets (12.5 mg total) by mouth daily. 09/03/20   TaDelsa GranaPA-C  triamcinolone ointment (KENALOG) 0.1 % Apply to affected skin one to two times daily for up to two weeks 11/05/20   TaDelsa GranaPA-C  valACYclovir (VALTREX) 1000 MG tablet Take 1 tablet (1,000 mg total) by mouth 2 (two) times daily. 03/28/18   Poulose, ElBethel BornNP  vedolizumab (ENTYVIO) 300 MG injection Inject into the  vein.    [provider]     Allergies Patient has no known allergies.  Family History  Problem Relation Age of Onset   Heart disease Mother    Heart attack Mother    Breast cancer Sister    Alcohol abuse Father    Cerebrovascular Accident Father    Arthritis Father    Hypertension Father    Hypertension Other    Diabetes Other    Breast cancer Sister [id number] Heart attack Other [id number] Cancer Sister     Social History Social History   Tobacco Use   Smoking status: Never   Smokeless tobacco: Never  Vaping Use   Vaping Use: Never used  Substance Use Topics   Alcohol use: No    Alcohol/week: 0.0 standard drinks   Drug use: No    Review of Systems  Constitutional: No fever/chills Eyes: No visual changes.  ENT: No sore throat. Cardiovascular: As above Respiratory: As above Gastrointestinal: No abdominal pain.  No nausea, no vomiting.   Genitourinary: Negative for dysuria. Musculoskeletal: Negative for back pain. Skin: Negative for rash. Neurological: Negative for headaches or weakness   ____________________________________________   PHYSICAL EXAM:  VITAL SIGNS: ED Triage Vitals  Enc Vitals Group     BP 05/05/21 1103 (!) 169/68     Pulse Rate 05/05/21 1103 78     Resp 05/05/21 1103 20     Temp 05/05/21 1103 98.8 F (37.1 C)     Temp Source 05/05/21 1103 Oral     SpO2 05/05/21 1103 98 %     Weight 05/05/21 1104 67 kg (147 lb 11.3 oz)     Height 05/05/21 1104 1.524 m (5')     Head Circumference --      Peak Flow --      Pain Score 05/05/21 1104 10     Pain Loc --      Pain Edu? --      Excl. in [place]? --     Constitutional: Alert and oriented.  Eyes: Conjunctivae are normal.   Nose: No congestion/rhinnorhea. Mouth/Throat: Mucous membranes are moist.    Cardiovascular: Normal rate, regular rhythm. Grossly normal heart sounds.  Good peripheral circulation. Respiratory: Normal respiratory effort.  No retractions. Lungs CTAB. Gastrointestinal:  Soft and nontender. No distention.  Musculoskeletal: No lower extremity tenderness nor edema.  Warm and well perfused Neurologic:  Normal speech and language. No gross focal neurologic deficits are appreciated.  Skin:  Skin is warm, dry and intact. No rash noted. Psychiatric: Mood and affect are normal. Speech and behavior are normal.  ____________________________________________   LABS (all labs ordered are listed, but only abnormal results are displayed)  Labs Reviewed  BASIC METABOLIC PANEL - Abnormal; Notable for the following components:      Result Value   Potassium 3.3 (*)    Glucose, Bld 113 (*)    All other components within normal limits  CBC  TROPONIN I (HIGH SENSITIVITY)  TROPONIN I (HIGH SENSITIVITY)   ____________________________________________  EKG ED ECG REPORT I, Lavonia Drafts, the attending physician, personally viewed and interpreted this ECG.  Date: 05/05/2021  Rhythm: normal sinus rhythm QRS Axis: normal Intervals: normal ST/T Wave abnormalities: normal Narrative Interpretation: no evidence of acute ischemia  ____________________________________________  RADIOLOGY  Chest x-ray read by me, no acute abnormality CT angiography, notified by radiologist of 2 small PEs ____________________________________________   PROCEDURES  Procedure(s) performed: No  Procedures   Critical Care performed: No ____________________________________________   INITIAL IMPRESSION / ASSESSMENT AND PLAN / ED COURSE  Pertinent labs & imaging results that were available during my care of the patient were reviewed by me and considered in my medical decision making (see chart for details).   Patient presents with chest discomfort, shortness of breath with exertion as detailed above, differential includes symptoms consistent with long COVID, bronchitis, bronchospasm, angina, PE  EKG is reassuring, delta troponin is normal.  Lab work is overall unremarkable.  Sent for  CT angiography which demonstrated 2 PEs in the left lung, this is likely the cause of her shortness of breath with exertion and chest discomfort however given strong family history will refer for cardiology evaluation as well.  We will start Eliquis given pulmonary emboli, close follow-up with PCP given history of Crohn's disease    ____________________________________________   FINAL CLINICAL IMPRESSION(S) / ED DIAGNOSES  Final diagnoses:  Acute pulmonary embolism without acute cor pulmonale, unspecified pulmonary embolism type ([place])  Note:  This document was prepared using Dragon voice recognition software and may include unintentional dictation errors.    Lavonia Drafts, MD 05/05/21 1640

## 2021-05-05 NOTE — Progress Notes (Signed)
Virtual Visit via Video Note  I connected with Carolyn Campbell on 05/05/21 at  9:40 AM EDT by a video enabled telemedicine application and verified that I am speaking with the correct person using two identifiers.  Location: Patient: home Provider: [facility]   I discussed the limitations of evaluation and management by telemedicine and the availability of in person appointments. The patient expressed understanding and agreed to proceed.  History of Present Illness:  COVID-19 - COVID+ 8/1, symptom onset 7/31 - on entyvio for Crohn's, last injection over a month ago - finished paxlovid last week  Worst symptom: Fever: no Cough: yes, nonproductive Shortness of breath: yes Wheezing:  sometimes Chest pain: yes, pressure, more with exertion.  Chest tightness: yes Chest congestion: yes Nasal congestion: no Runny nose: yes Sneezing: no Sore throat: no Vomiting: no Context:  chest pain and shortness of breath worse . Now getting at rest.  Relief with OTC cold/cough medications: yes  Treatments attempted: mucinex    Observations/Objective:  Well appearing, in NAD. Speaks in full sentences, no respiratory distress at rest.   Assessment and Plan:  COVID-19 With moderate symptoms and worsening s/p paxlovid. Now with shortness of breath and chest pain at rest, worsened by exertion, concerning for cardiac involvement vs PE. Given limited nature of video visit, recommended presentation to ED for further workup with stat labs, EKG, imaging. Patient verbalized understanding. Husband will drive patient to ED.     I discussed the assessment and treatment plan with the patient. The patient was provided an opportunity to ask questions and all were answered. The patient agreed with the plan and demonstrated an understanding of the instructions.   The patient was advised to call back or seek an in-person evaluation if the symptoms worsen or if the condition fails to improve as anticipated.  I  provided 15 minutes of non-face-to-face time during this encounter.   Myles Gip, DO

## 2021-05-06 ENCOUNTER — Encounter: Payer: Self-pay | Admitting: Oncology

## 2021-05-06 ENCOUNTER — Telehealth: Payer: Self-pay | Admitting: Gastroenterology

## 2021-05-06 NOTE — Telephone Encounter (Signed)
Blood clots in her lungs are secondary to COVID infection.  I recommended to delay the Entyvio infusion by 2 weeks.  Once she becomes asymptomatic from COVID infection, she can restart Entyvio  RV

## 2021-05-06 NOTE — Telephone Encounter (Signed)
Patient was diagnosed with blood clots in lungs and was put on Eloquis. Can she still take the vedolizumab (ENTYVIO) 300 MG injection on 05/12/21. Please advise

## 2021-05-06 NOTE — Telephone Encounter (Signed)
Patient verbalized understanding of instructions  

## 2021-05-13 ENCOUNTER — Telehealth: Payer: Self-pay | Admitting: Cardiovascular Disease

## 2021-05-13 NOTE — Telephone Encounter (Signed)
   Name: Carolyn Campbell  DOB: 07/27/1956  MRN: [id number]  Primary Cardiologist: None  Chart reviewed as part of pre-operative protocol coverage. Because of BASMA BUCHNER past medical history and time since last visit, she will require a follow-up visit in order to better assess preoperative cardiovascular risk.  Pre-op covering staff: - Please add "pre-op clearance" to the appointment on 05/22/21 with Dr. Candis Musa so provider is aware.  If applicable, this message will also be routed to pharmacy pool and/or primary cardiologist for input on holding anticoagulant/antiplatelet agent as requested below so that this information is available to the clearing provider at time of patient's appointment.   Kathyrn Drown, NP  05/13/2021, 2:49 PM

## 2021-05-13 NOTE — Telephone Encounter (Signed)
   [facility] Pre-operative Risk Assessment    Patient Name: Carolyn Campbell  DOB: [birth year] MRN: [id number]  HEARTCARE STAFF:  - IMPORTANT!!!!!! Under Visit Info/Reason for Call, type in Other and utilize the format Clearance MM/DD/YY or Clearance TBD. Do not use dashes or single digits. - Please review there is not already an duplicate clearance open for this procedure. - If request is for dental extraction, please clarify the # of teeth to be extracted. - If the patient is currently at the dentist's office, call Pre-Op Callback Staff (MA/nurse) to input urgent request.  - If the patient is not currently in the dentist office, please route to the Pre-Op pool.  Request for surgical clearance:  What type of surgery is being performed? Spoke with office for details - office states they will not preform an exam on patient until cleared from cardiology - forms states treatment may include dental cleaning, radiographs, fillings, crowns or bridges, extractions and root canal therapy   When is this surgery scheduled? 06/25/21  What type of clearance is required (medical clearance vs. Pharmacy clearance to hold med vs. Both)? both  Are there any medications that need to be held prior to surgery and how long? Not listed  Practice name and name of physician performing surgery? [facility] - Dr Patsey Berthold   What is the office phone number? [phone]   7.   What is the office fax number? [phone]  8.   Anesthesia type (None, local, MAC, general) ? Nitrous oxide - local anesthetic with epinephrine    Ace Gins 05/13/2021, 2:28 PM  _________________________________________________________________   (provider comments below)

## 2021-05-22 ENCOUNTER — Ambulatory Visit (INDEPENDENT_AMBULATORY_CARE_PROVIDER_SITE_OTHER): Payer: Medicare Other | Admitting: Cardiovascular Disease

## 2021-05-22 ENCOUNTER — Encounter: Payer: Self-pay | Admitting: Cardiovascular Disease

## 2021-05-22 ENCOUNTER — Other Ambulatory Visit: Payer: Self-pay

## 2021-05-22 VITALS — BP 150/70 | HR 80 | Ht 60.0 in | Wt 148.4 lb

## 2021-05-22 DIAGNOSIS — I700 Atherosclerosis of aorta: Secondary | ICD-10-CM

## 2021-05-22 DIAGNOSIS — R0602 Shortness of breath: Secondary | ICD-10-CM | POA: Diagnosis not present

## 2021-05-22 DIAGNOSIS — I2699 Other pulmonary embolism without acute cor pulmonale: Secondary | ICD-10-CM

## 2021-05-22 MED ORDER — APIXABAN 5 MG PO TABS: ORAL_TABLET | ORAL | 1 refills | Status: DC

## 2021-05-22 MED ORDER — APIXABAN 5 MG PO TABS: 5.0000 mg | ORAL_TABLET | Freq: Two times a day (BID) | ORAL | 0 refills | Status: DC

## 2021-05-22 NOTE — Patient Instructions (Addendum)
Medication Instructions:  Stay on eliquis 5 twice a day At least 3 months, if not 6 months  After that, consider taking eliquis 2.5 twice a day (Talk with PMD)  - If you are scheduled for any injections/ surgeries please mention to your physicians that you are on a blood thinner. If they state you will need to stop this prior to any injections/ surgeries, they they will need to call our office directly at [phone] in order to obtain a clearance for that or speak with your Primary Care Doctor  If you need a refill on your cardiac medications before your next appointment, please call your pharmacy.    Lab work: No new labs needed   If you have labs (blood work) drawn today and your tests are completely normal, you will receive your results only by: [place] (if you have MyChart) OR A paper copy in the mail If you have any lab test that is abnormal or we need to change your treatment, we will call you to review the results.   Testing/Procedures: Echo for shortness of breath, recent pulmonary embolism and covid  - Your physician has requested that you have an echocardiogram. Echocardiography is a painless test that uses sound waves to create images of your heart. It provides your doctor with information about the size and shape of your heart and how well your heart's chambers and valves are working. This procedure takes approximately one hour. There are no restrictions for this procedure. There is a possibility that an IV may need to be started during your test to inject an image enhancing agent. This is done to obtain more optimal pictures of your heart. Therefore we ask that you do at least drink some water prior to coming in to hydrate your veins.    Follow-Up: At [facility], you and your health needs are our priority.  As part of our continuing mission to provide you with exceptional heart care, we have created designated Provider Care Teams.  These Care Teams include  your primary Cardiologist (physician) and Advanced Practice Providers (APPs -  Physician Assistants and Nurse Practitioners) who all work together to provide you with the care you need, when you need it.  You will need a follow up appointment as needed pending the results of your echocardiogram  Providers on your designated Care Team:   Murray Hodgkins, NP Christell Faith, PA-C Marrianne Mood, PA-C Cadence Kathlen Mody, [place]  Any Other Special Instructions Will Be Listed Below (If Applicable).  COVID-19 Vaccine Information can be found at: ShippingScam.co.uk For questions related to vaccine distribution or appointments, please email vaccine@[email or web address] .[email or web address] or call [phone].     Echocardiogram An echocardiogram is a test that uses sound waves (ultrasound) to produce images of the heart. Images from an echocardiogram can provide important information about: Heart size and shape. The size and thickness and movement of your heart's walls. Heart muscle function and strength. Heart valve function or if you have stenosis. Stenosis is when the heart valves are too narrow. If blood is flowing backward through the heart valves (regurgitation). A tumor or infectious growth around the heart valves. Areas of heart muscle that are not working well because of poor blood flow or injury from a heart attack. Aneurysm detection. An aneurysm is a weak or damaged part of an artery wall. The wall bulges out from the normal force of blood pumping through the body. Tell a health care provider about: Any allergies you have. All medicines you are  taking, including vitamins, herbs, eye drops, creams, and over-the-counter medicines. Any blood disorders you have. Any surgeries you have had. Any medical conditions you have. Whether you are pregnant or may be pregnant. What are the risks? Generally, this is a safe test. However, problems may occur, including  an allergic reaction to dye (contrast) that may be used during the test. What happens before the test? No specific preparation is needed. You may eat and drink normally. What happens during the test?  You will take off your clothes from the waist up and put on a hospital gown. Electrodes or electrocardiogram (ECG)patches may be placed on your chest. The electrodes or patches are then connected to a device that monitors your heart rate and rhythm. You will lie down on a table for an ultrasound exam. A gel will be applied to your chest to help sound waves pass through your skin. A handheld device, called a transducer, will be pressed against your chest and moved over your heart. The transducer produces sound waves that travel to your heart and bounce back (or "echo" back) to the transducer. These sound waves will be captured in real-time and changed into images of your heart that can be viewed on a video monitor. The images will be recorded on a computer and reviewed by your health care provider. You may be asked to change positions or hold your breath for a short time. This makes it easier to get different views or better views of your heart. In some cases, you may receive contrast through an IV in one of your veins. This can improve the quality of the pictures from your heart. The procedure may vary among health care providers and hospitals. What can I expect after the test? You may return to your normal, everyday life, including diet, activities, and medicines, unless your health care provider tells you not to do that. Follow these instructions at home: It is up to you to get the results of your test. Ask your health care provider, or the department that is doing the test, when your results will be ready. Keep all follow-up visits. This is important. Summary An echocardiogram is a test that uses sound waves (ultrasound) to produce images of the heart. Images from an echocardiogram can provide  important information about the size and shape of your heart, heart muscle function, heart valve function, and other possible heart problems. You do not need to do anything to prepare before this test. You may eat and drink normally. After the echocardiogram is completed, you may return to your normal, everyday life, unless your health care provider tells you not to do that. This information is not intended to replace advice given to you by your health care provider. Make sure you discuss any questions you have with your health care provider. Document Revised: 04/29/2020 Document Reviewed: 04/29/2020 Elsevier Patient Education  2022 [employer].

## 2021-05-22 NOTE — Progress Notes (Signed)
Cardiology Office Note  Date:  05/22/2021   ID:  Carolyn Campbell, DOB May 05, 1956, MRN [id number]  PCP:  Delsa Grana, PA-C   Chief Complaint  Patient presents with   New Patient (Initial Visit)    Ref by Delsa Grana, PA-C for shortness of breath with having difficulties post covid. Medications reviewed by the patient verbally.     HPI:  Carolyn Campbell is a 65 year old woman with past medical history of Recent COVID Hypertension Crohn's Who presents by referral from ER physician Dr. Corky Downs for evaluation of her chest pain, shortness of breath cough  Covid aug 1st 2022 short of breath getting worse, with exertion  Seen in the emergency room May 05, 2021 CTA chest Small filling defects are noted in upper and lower lobe branches of the left pulmonary artery consistent with acute pulmonary emboli. Aortic atherosclerosis  Feels breathing is "50% " Worse with hills/steps, long walks can feel it  Have left leg pain, chronic Completed MRI  Prior cardiac catheterization 2014 Minor coronary irregularities ejection fraction 55%  EKG personally reviewed by myself on todays visit NSR rate 80 bmp, no ST or T wave changes  Echo 07/2020 NORMAL LEFT VENTRICULAR SYSTOLIC FUNCTION WITH AN ESTIMATED EF = >55 %  NORMAL RIGHT VENTRICULAR SYSTOLIC FUNCTION  MILD VALVULAR REGURGITATION (See above)  NO VALVULAR STENOSIS     PMH:   has a past medical history of Abdominal pain, epigastric, Allergic rhinitis, Anginal pain ([place]), Arthritis, Asthma, Breast cancer ([place]) (2006), CD (Crohn's disease) ([place]) (05/21/2015), Clotting disorder ([place]), Cognitive deficits as late effect of cerebrovascular disease, COPD (chronic obstructive pulmonary disease) ([place]), Crohn's disease of both small and large intestine with rectal bleeding ([place]) (12/04/2014), Depression, Dermatitis, eczematoid (05/21/2015), 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  PSH:    Past Surgical History:  Procedure Laterality Date   BREAST BIOPSY Left 2006   POS   BREAST LUMPECTOMY Left 09/20/2004   positive   BREAST SURGERY Left    malignant biopsy   CARDIAC CATHETERIZATION     COLONOSCOPY  2015   COLONOSCOPY WITH PROPOFOL N/A 06/08/2017   Procedure: COLONOSCOPY WITH PROPOFOL;  Surgeon: Lin Landsman, MD;  Location: [facility];  Service: Gastroenterology;  Laterality: N/A;   COLONOSCOPY WITH PROPOFOL N/A 03/08/2019   Procedure: COLONOSCOPY WITH PROPOFOL;  Surgeon: Lin Landsman, MD;  Location[facility] ENDOSCOPY;  Service: Gastroenterology;  Laterality: N/A;   COLONOSCOPY WITH PROPOFOL N/A 07/16/2020   Procedure: COLONOSCOPY WITH PROPOFOL;  Surgeon: Lin Landsman, MD;  Location: [facility] ENDOSCOPY;  Service: Gastroenterology;  Laterality: N/A;   ESOPHAGOGASTRODUODENOSCOPY  2015   ESOPHAGOGASTRODUODENOSCOPY (EGD) WITH PROPOFOL N/A 06/08/2017   Procedure: ESOPHAGOGASTRODUODENOSCOPY (EGD) WITH PROPOFOL;  Surgeon: Lin Landsman, MD;  Location: [place];  Service: Gastroenterology;  Laterality: N/A;   ESOPHAGOGASTRODUODENOSCOPY (EGD) WITH PROPOFOL N/A 03/08/2019   Procedure: ESOPHAGOGASTRODUODENOSCOPY (EGD) WITH PROPOFOL;  Surgeon: Lin Landsman, MD;  Location[facility] END[facility]COPY;  Service: Gastroenterology;  Laterality: N/A;   FINGER SURGERY     Right small finger   FRACTURE SURGERY     GIVENS CAPSULE STUDY  2015   TUBAL LIGATION      Current Outpatient Medications  Medication Sig Dispense Refill   apixaban (ELIQUIS) 5 MG TABS tablet Take 2 tablets (5m) twice daily for 7 days, then 1  tablet (64m) twice daily 60 tablet 1   latanoprost (XALATAN) 0.005 % ophthalmic solution Place 1 drop into both eyes at bedtime.      levothyroxine  (SYNTHROID) 50 MCG tablet Take 1 tablet (50 mcg total) by mouth daily before breakfast. On empty stomach 90 tablet 3   losartan-hydrochlorothiazide (HYZAAR) 50-12.5 MG tablet Take 0.5 tablets by mouth daily. D/c HCTZ 45 tablet 3   Multiple Vitamin (MULTIVITAMIN) tablet Take 1 tablet by mouth daily.     pregabalin (LYRICA) 25 MG capsule Take 25 mg by mouth 3 (three) times daily.     rosuvastatin (CRESTOR) 20 MG tablet Take 1 tablet (20 mg total) by mouth at bedtime. 90 tablet 3   sertraline (ZOLOFT) 25 MG tablet Take 0.5 tablets (12.5 mg total) by mouth daily. 45 tablet 3   triamcinolone ointment (KENALOG) 0.1 % Apply to affected skin one to two times daily for up to two weeks 30 g 0   valACYclovir (VALTREX) 1000 MG tablet Take 1 tablet (1,000 mg total) by mouth 2 (two) times daily. 30 tablet 5   vedolizumab (ENTYVIO) 300 MG injection Inject into the vein.     omeprazole (PRILOSEC) 20 MG capsule Take 1 capsule (20 mg total) by mouth daily. (Patient not taking: Reported on 05/22/2021) 90 capsule 0   No current facility-administered medications for this visit.    Allergies:   Patient has no known allergies.   Social History:  The patient  reports that she has never smoked. She has never used smokeless tobacco. She reports that she does not drink alcohol and does not use drugs.   Family History:   family history includes Alcohol abuse in her father; Arthritis in her father; Breast cancer in her sister; Breast cancer (age of onset: 688 in her sister; Cancer in her sister; Cerebrovascular Accident in her father; Diabetes in an other family member; Heart attack in her mother; Heart attack (age of onset: 489 in an other family member; Heart disease in her mother; Hypertension in her father and another family member.    Review of Systems: Review of Systems  Constitutional: Negative.   HENT: Negative.    Respiratory: Negative.    Cardiovascular: Negative.   Gastrointestinal: Negative.    Musculoskeletal: Negative.   Neurological: Negative.   Psychiatric/Behavioral: Negative.    All other systems reviewed and are negative.   PHYSICAL EXAM: VS:  BP (!) 150/70 (BP Location: Right Arm, Patient Position: Sitting, Cuff Size: Normal)   Pulse 80   Ht 5' (1.524 m)   Wt 148 lb 6 oz (67.3 kg)   SpO2 99%   BMI 28.98 kg/m  , BMI Body mass index is 28.98 kg/m. GEN: Well nourished, well developed, in no acute distress HEENT: normal Neck: no JVD, carotid bruits, or masses Cardiac: RRR; no murmurs, rubs, or gallops,no edema  Respiratory:  clear to auscultation bilaterally, normal work of breathing GI: soft, nontender, nondistended, + BS MS: no deformity or atrophy Skin: warm and dry, no rash Neuro:  Strength and sensation are intact Psych: euthymic mood, full affect   Recent Labs: 01/29/2021: ALT 20; TSH 0.73 05/05/2021: BUN 16; Creatinine, Ser 0.83; Hemoglobin 13.1; Platelets 229; Potassium 3.3; Sodium 139    Lipid Panel Lab Results  Component Value Date   CHOL 193 01/29/2021   HDL 89 01/29/2021   LDLCALC 85 01/29/2021   TRIG 91 01/29/2021      Wt Readings from Last 3 Encounters:  05/22/21 148 lb 6 oz (67.3  kg)  05/05/21 147 lb 11.3 oz (67 kg)  01/29/21 147 lb 9.6 oz (67 kg)      ASSESSMENT AND PLAN:  Problem List Items Addressed This Visit       Cardiology Problems   Atherosclerosis of aorta ([place]) - Primary   Other Visit Diagnoses     Acute pulmonary embolism without acute cor pulmonale, unspecified pulmonary embolism type ([place])          SOB/COVID/pulmonary embolism Recent covid beginning of August 2022  And pulmonary embolism 2 weeks later documented on CT scan Echo scheduled to make sure no cardiac etiology Would likely need to stay on Eliquis at least 3 months,  Given very sedentary lifestyle, severe leg pain limiting her mobility, high risk of DVT I would err on the side of caution and long-term may need to be on Eliquis 2.5 twice  daily Perhaps once her mobility improves, could come off the Eliquis Consider hypercoagulable work-up in the future with hematology oncology  Aortic atherosclerosis CT scan images pulled up and reviewed, mild to moderate aortic atherosclerosis noted Consider adding a statin Will wait given her significant leg pain This could be started by primary care and follow-up once leg symptoms improve   Total encounter time more than 60 minutes  Greater than 50% was spent in counseling and coordination of care with the patient    Signed, Esmond Plants, M.D., Ph.D. [place], [place]

## 2021-05-26 ENCOUNTER — Telehealth: Payer: Self-pay | Admitting: Cardiovascular Disease

## 2021-05-26 NOTE — Telephone Encounter (Signed)
She was diagnosed with acute PE 3 weeks ago, she cannot hold her anticoagulation at this time. Office states they were planning for pt to continue Eliquis for lumbar steroid injection which is typically a procedure we recommend holding anticoagulation for. Will forward to MD to confirm they are ok with pt continuing on anticoag for procedure.

## 2021-05-26 NOTE — Telephone Encounter (Signed)
[facility] Phys[facility] calling to make office aware  For patients lumbar steroid injection on Friday they plan to keep her on Eliquis medication

## 2021-05-26 NOTE — Telephone Encounter (Signed)
I s/w [facility] clinic Physiatry and confirmed procedure is for lumbar steroid injection. I was stated to me that before 2015 they would have the pt's hold their blood thinners, though for the lumbar region it has been found safe to remain on blood thinner per requesting office. Requesting office did state though that they will follow what ever protocol to cardiologist and Pharm-d feels is appropriate. Fax # [phone]. It was also stated th size needle will be a 25 gauge.

## 2021-05-27 NOTE — Telephone Encounter (Addendum)
   Name: Carolyn Campbell  DOB: 1955-11-10  MRN: [id number]   Primary Cardiologist: Ida Rogue, MD  Chart reviewed as part of pre-operative protocol coverage.   Per Dr. Rockey Situ: If the [facility] team feels comfortable performing steroid injection on the Eliquis that would be safer .  On office visit she was severely debilitated  If she is required to come off Eliquis, certainly this could be done, would hold 2 days but would be high risk to the patient and she would need to be aware of the risks given recent pulmonary embolism.  Nor further workup at this time. She is cleared for back injection.   I will route this recommendation to the requesting party via Epic fax function and remove from pre-op pool. Please call with questions.  [place], PA 05/27/2021, 1:02 PM

## 2021-05-27 NOTE — Telephone Encounter (Signed)
Left message with husband. She will call back.  Per Dr. Rockey Situ: If the [facility] team feels comfortable performing steroid injection on the Eliquis that would be safer .  On office visit she was severely debilitated  If she is required to come off Eliquis, certainly this could be done, would hold 2 days but would be high risk to the patient and she would need to be aware of the risks given recent pulmonary embolism

## 2021-06-09 ENCOUNTER — Telehealth: Payer: Self-pay | Admitting: Cardiovascular Disease

## 2021-06-09 NOTE — Telephone Encounter (Signed)
I s/w the pt and d/w her the recommendations not safe for elective procedure at this time, see notes from Melina Copa, [facility] pre op provider today. Pt is agreeable to plan of care for a f/u appt after 08/05/21. Pt has been scheduled with Ignacia Bayley, NP 08/06/21 @ 1:55 for pre op clearance at that time. I will will forward notes to NP for appt. I will send FYI to requesting office to please see notes that surgery will need to be postponed until she has been cleared by cardiology. Pt thanked me for the call and the help. The pt states to me that she forgot to tell the doctor that her mother died of a major heart attack and did not know if she needed to let the doctor know this. I said absolutely to be sure to discuss further at appt 08/06/21.

## 2021-06-09 NOTE — Telephone Encounter (Signed)
   Name:  FATMA RUTTEN  DOB:  May 04, 1956  MRN:  [id number]   Primary Cardiologist: Ida Rogue, MD  Chart reviewed as part of pre-operative protocol coverage. Patient recently saw Dr. Rockey Situ 9/2/2 for chest pain, SOB, cough. Recently had Covid in early August then sustained acute pulmonary emboli by CT scan 05/05/21. She has a 2D echocardiogram planned 06/11/21. Dr. Rockey Situ has recommended that she need to stay on Eliquis for at least 3 months but the total duration is to be determined based on mobility, +/- hypercoagulable workup. Therefore, would NOT clear patient for elective procedure at this time due to risk of coming off anticoagulation. Will route to callback team to please arrange a follow-up visit after 08/05/21 to review plans for anticoagulation and clearance closer to that time.  Pre-op covering staff: - Please schedule appointment and call patient to inform them.  - Please contact requesting surgeon's office via preferred method (i.e, phone, fax) to inform them of need for appointment prior to surgery.  Charlie Pitter, PA-C 06/09/2021, 5:36 PM

## 2021-06-09 NOTE — Telephone Encounter (Signed)
   [facility] Pre-operative Risk Assessment    Patient Name: Carolyn Campbell  DOB: 1955/10/05 MRN: [id number]  HEARTCARE STAFF:  - IMPORTANT!!!!!! Under Visit Info/Reason for Call, type in Other and utilize the format Clearance MM/DD/YY or Clearance TBD. Do not use dashes or single digits. - Please review there is not already an duplicate clearance open for this procedure. - If request is for dental extraction, please clarify the # of teeth to be extracted. - If the patient is currently at the dentist's office, call Pre-Op Callback Staff (MA/nurse) to input urgent request.  - If the patient is not currently in the dentist office, please route to the Pre-Op pool.  Request for surgical clearance:  What type of surgery is being performed? Bilateral upper eyelid ptosis repair   When is this surgery scheduled? TBD   What type of clearance is required (medical clearance vs. Pharmacy clearance to hold med vs. Both)? both  Are there any medications that need to be held prior to surgery and how long? Eliquis instructions   Practice name and name of physician performing surgery? [facility] - Dr Isidoro Donning   What is the office phone number? [phone]   7.   What is the office fax number? [phone]  8.   Anesthesia type (None, local, MAC, general) ? MAC   Carolyn Campbell 06/09/2021, 9:42 AM  _________________________________________________________________   (provider comments below)

## 2021-06-11 ENCOUNTER — Other Ambulatory Visit: Payer: Self-pay

## 2021-06-11 ENCOUNTER — Ambulatory Visit (INDEPENDENT_AMBULATORY_CARE_PROVIDER_SITE_OTHER): Payer: Medicare Other

## 2021-06-11 DIAGNOSIS — R0602 Shortness of breath: Secondary | ICD-10-CM | POA: Diagnosis not present

## 2021-06-11 DIAGNOSIS — I2699 Other pulmonary embolism without acute cor pulmonale: Secondary | ICD-10-CM

## 2021-06-11 LAB — ECHOCARDIOGRAM COMPLETE
AR max vel: 1.99 cm2
AV Area VTI: 1.96 cm2
AV Area mean vel: 2.06 cm2
AV Mean grad: 4 mmHg
AV Peak grad: 8 mmHg
Ao pk vel: 1.41 m/s
Area-P 1/2: 3.08 cm2
Calc EF: 56.6 %
S' Lateral: 2.5 cm
Single Plane A2C EF: 58.2 %
Single Plane A4C EF: 54.5 %

## 2021-06-12 ENCOUNTER — Telehealth: Payer: Self-pay

## 2021-06-12 ENCOUNTER — Telehealth: Payer: Self-pay | Admitting: Cardiovascular Disease

## 2021-06-12 NOTE — Telephone Encounter (Signed)
Pt c/o medication issue:  1. Name of Medication: entyvio infusion   2. How are you currently taking this medication (dosage and times per day)? unknown  3. Are you having a reaction (difficulty breathing--STAT)? no  4. What is your medication issue? Patient was contacted by [facility] [phone])  They asked her to contact Dr. Rockey Situ to get permission to restart entyvio infusions .   Please fwd note to [phone] (fax for Rheumatology infusion clinic )

## 2021-06-12 NOTE — Telephone Encounter (Signed)
Copied from [place] [phone]. Topic: General - Other >> Jun 12, 2021 11:09 AM P[place], [name] wrote: Reason for CRM: Pt called in stating she is getting tired of getting shots for her leg, pt stated she believes she needs a CT scan to find out what is going on with her left left. Please advise what other options the pt has.

## 2021-06-15 ENCOUNTER — Telehealth: Payer: Self-pay | Admitting: Gastroenterology

## 2021-06-15 MED ORDER — ENTYVIO 300 MG IV SOLR: INTRAVENOUS | 5 refills | Status: DC

## 2021-06-15 NOTE — Telephone Encounter (Addendum)
Was able to call Carolyn Campbell back to inform her of what Dr. Rockey Situ advised on her Rheumatology infusion  Was referred by the ER to our clinic  Seen once in our clinic for small PEs that she developed after being sedentary in setting of covid  No other active cardiac issues  I am unfamiliar with the medication detailed  Will defer restart to rheumatology  Thx  TGollan   Carolyn Campbell verbalized understanding Also spoke to Carolyn Campbell regarding her recent ECHO Dr. Rockey Situ had a chance to review her results and advised   "Echocardiogram  Normal ejection fraction, no valve disease "  Carolyn Campbell very thankful for the phone call of her results and regarding her infusion, otherwise all questions and concerns were address with nothing further at this time. Will see at next schedule f/u appt.   Fax Dr. Rockey Situ message to [facility] to the entyvio infusion clinic.

## 2021-06-15 NOTE — Telephone Encounter (Signed)
[place] and Rhematology and got the fax number which is [phone] and send to attention [place]. Faxed orders to them. Informed patient of this information

## 2021-06-15 NOTE — Telephone Encounter (Signed)
Okay to restart entyvio  RV

## 2021-06-15 NOTE — Addendum Note (Signed)
Addended by: Ulyess Blossom L on: 06/15/2021 10:14 AM   Modules accepted: Orders

## 2021-06-15 NOTE — Telephone Encounter (Signed)
Please advised if it is okay for patient to restart her infusions

## 2021-06-15 NOTE — Telephone Encounter (Signed)
Pt. Is requesting a phone call. She is ready to start her infusions back but in order to start she needs permission from Dr. Marius Ditch first. She said a fax would need to be sent to [facility] and Rhematology

## 2021-06-16 NOTE — Telephone Encounter (Signed)
Contacted pt told her needs to schedule an appointment to be seen for her concern of wanting CT scan done.

## 2021-06-17 ENCOUNTER — Ambulatory Visit: Payer: Medicare Other | Attending: Physical Medicine & Rehabilitation | Admitting: Physical Therapy

## 2021-06-17 ENCOUNTER — Other Ambulatory Visit: Payer: Self-pay

## 2021-06-17 ENCOUNTER — Telehealth (INDEPENDENT_AMBULATORY_CARE_PROVIDER_SITE_OTHER): Payer: Medicare Other | Admitting: Family Medicine

## 2021-06-17 ENCOUNTER — Encounter: Payer: Self-pay | Admitting: Family Medicine

## 2021-06-17 VITALS — BP 163/51 | HR 70

## 2021-06-17 DIAGNOSIS — M5442 Lumbago with sciatica, left side: Secondary | ICD-10-CM | POA: Diagnosis not present

## 2021-06-17 DIAGNOSIS — M6281 Muscle weakness (generalized): Secondary | ICD-10-CM | POA: Diagnosis present

## 2021-06-17 DIAGNOSIS — R2681 Unsteadiness on feet: Secondary | ICD-10-CM | POA: Insufficient documentation

## 2021-06-17 DIAGNOSIS — M79605 Pain in left leg: Secondary | ICD-10-CM | POA: Insufficient documentation

## 2021-06-17 DIAGNOSIS — G8929 Other chronic pain: Secondary | ICD-10-CM | POA: Diagnosis present

## 2021-06-17 DIAGNOSIS — M545 Low back pain, unspecified: Secondary | ICD-10-CM

## 2021-06-17 DIAGNOSIS — R262 Difficulty in walking, not elsewhere classified: Secondary | ICD-10-CM | POA: Diagnosis present

## 2021-06-17 HISTORY — DX: Low back pain, unspecified: M54.50

## 2021-06-17 NOTE — Therapy (Signed)
[facility] [address], [place], [address] Phone: [phone]   Fax:  [phone]  Physical Therapy Evaluation  Patient Details  Name: Carolyn Campbell MRN: [id number] Date of Birth: [birth year] Referring Provider (PT): Girtha Hake, MD   Encounter Date: 06/17/2021   PT End of Session - 06/17/21 1501     Visit Number 1    Number of Visits 24    Date for PT Re-Evaluation 09/09/21    Authorization Type [employer] MEDICARE reporting period from 06/17/2021    Progress Note Due on Visit 10    PT Start Time 1345    PT Stop Time 1440    PT Time Calculation (min) 55 min    Activity Tolerance Patient tolerated treatment well    Behavior During Therapy [facility] for tasks assessed/performed             Past Medical History:  Diagnosis Date   Abdominal pain, epigastric    Allergic rhinitis    Anginal pain ([place])    states MD said it was GERD   Arthritis    Asthma    Breast cancer ([place]) 2006   LT LUMPECTOMY   CD (Crohn's disease) ([place]) 05/21/2015   FOLLOWED BY GI    Clotting disorder ([place])    Cognitive deficits as late effect of cerebrovascular disease    COPD (chronic obstructive pulmonary disease) ([place])    Crohn's disease of both small and large intestine with rectal bleeding ([place]) 12/04/2014   Depression    Currently taking zoloft.   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    Dyspnea    Esophageal reflux    Fever blister 07/19/2018   Glaucoma    vitreous degeneration   History of kidney stones    Hyperlipidemia    Hypertension    Hypothyroidism    Occlusion, cerebral artery    NOS w/infarction   Osteoporosis    Personal history of radiation therapy 2006   BREAST CA   Stroke [facility])    Ulcer     Past Surgical History:  Procedure Laterality Date   BREAST BIOPSY Left 2006   POS   BREAST LUMPECTOMY Left 09/20/2004   positive   BREAST SURGERY Left     malignant biopsy   CARDIAC CATHETERIZATION     COLONOSCOPY  2015   COLONOSCOPY WITH PROPOFOL N/A 06/08/2017   Procedure: COLONOSCOPY WITH PROPOFOL;  Surgeon: Lin Landsman, MD;  Location: [place];  Service: Gastroenterology;  Laterality: N/A;   COLONOSCOPY WITH PROPOFOL N/A 03/08/2019   Procedure: COLONOSCOPY WITH PROPOFOL;  Surgeon: Lin Landsman, MD;  Location: [facility] ENDOSCOPY;  Service: Gastroenterology;  Laterality: N/A;   COLONOSCOPY WITH PROPOFOL N/A 07/16/2020   Procedure: COLONOSCOPY WITH PROPOFOL;  Surgeon: Lin Landsman, MD;  Location: [facility] ENDOSCOPY;  Service: Gastroenterology;  Laterality: N/A;   ESOPHAGOGASTRODUODENOSCOPY  2015   ESOPHAGOGASTRODUODENOSCOPY (EGD) WITH PROPOFOL N/A 06/08/2017   Procedure: ESOPHAGOGASTRODUODENOSCOPY (EGD) WITH PROPOFOL;  Surgeon: Lin Landsman, MD;  Location: [place];  Service: Gastroenterology;  Laterality: N/A;   ESOPHAGOGASTRODUODENOSCOPY (EGD) WITH PROPOFOL N/A 03/08/2019   Procedure: ESOPHAGOGASTRODUODENOSCOPY (EGD) WITH PROPOFOL;  Surgeon: Lin Landsman, MD;  Location: [facility] ENDOSCOPY;  Service: Gastroenterology;  Laterality: N/A;   FINGER SURGERY     Right small finger   FRACTURE SURGERY     GIVENS CAPSULE STUDY  2015   TUBAL LIGATION  Vitals:   06/17/21 1353  BP: (!) 163/51  Pulse: 70  SpO2: 98%      Subjective Assessment - 06/17/21 1353     Subjective Patient is known to this clinic and PT from prior episode of care for low back pain. Patient states her low back pain has returned and is really affecting her left leg. She has had 2 shots in her back and thigh but it seemed like it made it worse. She feels like the pain keeps moving around and her doctor wanted to do another shot in her buttocks but pt preferred to come to PT. She was feeling better after her last episode of PT. States she was doing her exercises and her pain started back all of a sudden but she is unsure what she may  have done to trigger it. It started hurting all of a sudden around July 2022. It has gotten worse since then. States the only way she can get relief to function is to take lyrica (which she was already on) and tylenol. She is here with a SPC and states she has been using her RW but prior to this problem she didn't need any assistive device. No injuries or falls since she was last in PT. She has been a little depressed with all of this going on. Prior CVA affected L LE but pt does not recall paresthesia. Patient recently diagnoses with pulmonary embolism x2 in Aug 2022 following COVID 19 infection and continues to experience shortness of breath. She states she has been placed on eliquis with no other restrictions except for anything that would require stopping eliquis (ie surgery)    Pertinent History Patient is a 65 y.o. 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 This patient's chief complaints consist of left buttock pain with radiation into L LE to foot and up to low back leading to the following functional deficits: difficulty with usual activities such as walking, sitting, changing positions, bed mobility, household and community mobility, stairs, lifting, moving L LE, house cleaning, shopping cart, sleeping, going to school, exercising. .  Relevant past medical history and comorbidities include COVID19 with 2 PE found in August 2022 (on eliquis), shortness of breath, history of CVA (2008, affects left side mostly LE), HTN, osteoporosis, Crohn's disease (currently gets infusions), osteopenia, glaucoma, cataract removal, hx of depression (weaning medications with MD supervision), breast cancer (2006), clotting disorder, hypothyroidism, cardiac catheterization. Patient denies hx of seizures, lung problem,  diabetes, unexplained weight loss, changes in bowel or bladder problems, new onset stumbling or dropping things apart from history (L leg feels like it gives out more since leg pain), history of spinal surgery.    Limitations Lifting;Standing;Walking;House hold activities   walking, sitting, changing positions, bed mobility, household and community mobility, stairs, lifting, moving L LE, house cleaning, shopping cart, sleeping, going to school, exercising.   Diagnostic tests Lumbar MRI report 11/02/2020: "IMPRESSION:  Lumbar spondylosis as outlined.  At L4-L5, there is mild/moderate disc degeneration. Grade 1  anterolisthesis. Posterior annular fissure. Disc uncovering with  disc bulge. Moderate facet arthrosis with mild ligamentum flavum  hypertrophy. Trace right facet joint effusion. Bilateral neural  foraminal narrowing (moderate right, mild left).  No more than mild spinal canal or neural foraminal narrowing at the  remaining levels.  Edema and enhancement within the  right L4 and L5 pedicles/articular  pillars, which may be degenerative or may reflect stress reaction.  Degenerative marrow edema and enhancement along the right L4-L5  facet joint."    Chest CT report 05/05/2021: "IMPRESSION:  Small filling defects are noted in upper and lower lobe branches of  the left pulmonary artery consistent with acute pulmonary emboli.  Critical Value/emergent results were called by telephone at the time  of interpretation on 05/05/2021 at 3:08 pm to provider Lavonia Drafts  , who verbally acknowledged these results.  Aortic Atherosclerosis (ICD10-I70.0)."    Patient Stated Goals want to be able to move around without hurting so much    Currently in Pain? Yes    Pain Score 3    W: 10/10; B: 2-3/10   Pain Location Buttocks   worst in left buttock and down left leg to foot through lateral thigh and lower leg.   Pain Orientation Left    Pain Descriptors / Indicators Aching;Sharp;Throbbing   aching down leg, sharp/throbbing  in buttocks   Pain Type Chronic pain    Pain Radiating Towards left LE lateral thigh and leg to foot.    Pain Onset More than a month ago    Pain Frequency Constant   back and leg pain is constant   Aggravating Factors  trying to get up from sitting, moving a certain way, trying to sit (uncontrolled especially), rolling in bed    Pain Relieving Factors walking is not as bad, lyrical, tylenol    Effect of Pain on Daily Activities Functional Limitations: walking, sitting, changing positions, bed mobility, household and community mobility, stairs, lifting, moving L LE, house cleaning, shopping cart, sleeping, going to school, exercising.             [facility] PT Assessment - 06/17/21 0001       Assessment   Medical Diagnosis osseous stenosis of neural canal of lumbar region, chronic left-sided low back pain with left-sided sciatica, chronic left hip pain, pain of left sacroiliac joint    Referring Provider (PT) Girtha Hake, MD    Onset Date/Surgical Date 03/20/21    Prior Therapy successful episode of care for low back pain this spring      Precautions   Precautions Fall   must stay on eliquis     Balance Screen   Has the patient fallen in the past 6 months No    Has the patient had a decrease in activity level because of a fear of falling?  No    Is the patient reluctant to leave their home because of a fear of falling?  No      Home Environment   Living Environment --   no concerns about getting around home safely     Prior Function   Level of Independence Independent    Vocation On disability;Retired    Leisure sewing, exercising, going to school             Roscoe score: 40/100 (lumbar spine questionnaire)  OBSERVATION/INSPECTION Posture Posture (seated): shifted slightly towards right.  Posture (standing): left shoulder shifted slightly left and hips slightly right with decreased weight bearing on L LE. Lumbar lordosis WFL.   Anthropometrics Tremor: none Body composition: overweight per BMI: 28.9  Muscle bulk: WFL Edema: nil Functional Mobility Bed mobility: supine <> sit and rolling mod I due to increased time with reports of pain Transfers: Sit <> stand mod I with significant difficulty pushing with BUE and  report of concordant pain. SPC R UE.  Gait: ambulates with antalgic gait faovirng L LE, slow and carefully with SPC in R UE.   SPINE MOTION Lumbar Spine AROM *Indicates pain Flexion: fingers to B patella, "that didn't feel bad" afraid to go further because expect it to hurt, lacks lumbar flexion. Cannot remember her usual ROM. Last able to go to ankles at PT discharge in April.  Extension: 50% feels good at stomach, minimal movement noted in low back.  Side Flexion:   R finger to proximal patella, "I can feel it"  L finger to proximal patella, "feels okay" Rotation (hips stablized by PT):  R 20% "feels okay" grimacing L 30% "feels okay"  NEUROLOGICAL  Upper Motor Neuron Screen Hoffman's and Clonus (ankle) negative bilaterally.  Dermatomes L2-S2 appears decreased to light touch on L vs R  Deep Tendon Reflexes R/L  2+/2+ Quadriceps reflex (L4) 3+/3+ Achilles reflex (S1)   PERIPHERAL JOINT MOTION (in degrees) Passive Range of Motion (PROM) Comments: B LE WFL except B hip extension mildly limited.   MUSCLE PERFORMANCE (MMT):  *Indicates pain 06/17/21 Date Date  Joint/Motion R/L R/L R/L  Hip     Flexion (L1, L2) 3+/2+ / /  Extension (knee ext) 4/3+ / /  Extension (knee flex) 3+/3- / /  Abduction 4/3 / /  Knee     Extension (L3) 5/3+ / /  Flexion (S2) 4/3+ / /  Ankle/Foot     Dorsiflexion (L4) 4+/3+ / /  Great toe extension (L5) 4+/3+ / /  Eversion (S1) 4/3 / /  Plantarflexion (S1) 4+/3+ / /  Comments: Unable to maintain left ankle PF with attempt at toe walking (very difficult and painful), able to maintain B DF with heel walking but slightly limited ROM on L compared to R. Has some  neurologic quality to weakness on L LE and reports she must think about it to do what PT asks since her CVA many years ago.   SPECIAL TESTS:  Neurodynamic Tests   LOWER LIMB Straight Leg Raise (Sciatic nerve)  R negative  L positive for concordant symptoms  Hip Tests FABER: R = negative, L = positive for pain at lateral hip. ER Derotation test: L = negative  ACCESSORY MOTION:  Localized pain with CPA to lumbar segments, worst at base of spine. Pain radiating up back with CPA to sacrum.   PALPATION: R glute region pain but "feels good" and non-concordant. Tension noted in muscles.  L glute extremely TTP at upper glutes and glute min/med and greater trochanter. Less tender over remaining buttocks region.   Objective measurements completed on examination: See above findings.    TREATMENT:   Therapeutic exercise: to centralize symptoms and improve ROM, strength, muscular endurance, and activity tolerance required for successful completion of functional activities.  - seated left nerve glide slider technique, 1x10 (rec 10 reps 3 times a day).  - Education on diagnosis, prognosis, POC, anatomy and physiology of current condition.   Exercise purpose/form. Self management techniques. Education on diagnosis, prognosis, POC, anatomy and physiology of current condition Education on HEP including handout      PT Education - 06/17/21 1501     Education Details Exercise purpose/form. Self management techniques. Education on diagnosis, prognosis, POC, anatomy and physiology of current condition Education on HEP    Person(s) Educated Patient    Methods Explanation;Demonstration;Tactile cues;Verbal cues    Comprehension Verbalized understanding;Returned demonstration;Verbal cues required;Tactile cues required;Need further instruction  PT Short Term Goals - 06/17/21 1507       PT SHORT TERM GOAL #1   Title Be independent with initial home exercise program for  self-management of symptoms.    Baseline Initial HEP provided at IE (06/17/2021);    Time 2    Period Weeks    Status New    Target Date 07/01/21               PT Long Term Goals - 06/17/21 1507       PT LONG TERM GOAL #1   Title Be independent with a long-term home exercise program for self-management of symptoms.    Baseline Initial HEP provided at IE (06/17/2021);    Time 12    Period Weeks    Status New   TARGET DATE FOR ALL LONG TERM GOALS: 09/09/2021     PT LONG TERM GOAL #2   Title Demonstrate improved FOTO score to equal or greater than 50 by visit #12 to demonstrate improvement in overall condition and self-reported functional ability.    Baseline 40 (06/17/2021);    Time 12    Period Weeks    Status New      PT LONG TERM GOAL #3   Title Have full lumbar AROM with no compensations or increase in pain in all planes except intermittent end range discomfort to allow patient to complete valued activities with less difficulty.    Baseline painful and limited - see objective exam (06/17/2021);    Time 12    Period Weeks    Status New      PT LONG TERM GOAL #4   Title Reduce pain with functional activities to equal or less than 2/10 to allow patient to complete usual activities including ADLs, IADLs, and social engagement with less difficulty.    Baseline up to 10/10 (06/17/2021);    Time 12    Period Weeks    Status New      PT LONG TERM GOAL #5   Title Complete community, work and/or recreational activities without limitation due to current condition.    Baseline Functional Limitations: walking, sitting, changing positions, bed mobility, household and community mobility, stairs, lifting, moving L LE, house cleaning, shopping cart, sleeping, going to school, exercising.    Time 12    Period Weeks    Status New      Additional Long Term Goals   Additional Long Term Goals Yes      PT LONG TERM GOAL #6   Title Patient will ambulate equal or greater than 1000 feet  with no AD on 6 Minute Walk Test to demonstrate improved household and community mobility.    Baseline unable to tolerate test - difficulty walking ~ 50 feet from/to waiting room with [facility] (06/17/2021);    Time 12    Period Weeks    Status New                    Plan - 06/17/21 1514     Clinical Impression Statement Patient is a 65 y.o. female referred to outpatient physical therapy with a medical diagnosis of  osseous stenosis of neural canal of lumbar region, chronic left-sided low back pain with left-sided sciatica, chronic left hip pain, pain of left sacroiliac joint who presents with signs and symptoms consistent with left sided low back pain with L LE radiculopathy/sciatica. Patient has history of stroke that affects left LE as well, so unclear if neurologic weakness and sensory  loss is from low back lesion or prior CVA; based on pt report, most likely mostly from prior CVA. Patient presents with significant pain, paresthesia, sensation, motor control, muscle performance (strength/power/endurance), activity tolerance, and balance impairments that are limiting ability to complete her usual activities including walking, sitting, changing positions, bed mobility, household and community mobility, stairs, lifting, moving L LE, house cleaning, shopping cart, sleeping, going to school, exercising without difficulty. These deficits put her at increased fall risk and decreases her quality of life as illustrated y FOTO score of 40/100. Patient will benefit from skilled physical therapy intervention to address current body structure impairments and activity limitations to improve function and work towards goals set in current POC in order to return to prior level of function or maximal functional improvement.    Personal Factors and Comorbidities Age;Past/Current Experience;Comorbidity 3+;Fitness;Time since onset of injury/illness/exacerbation    Comorbidities Relevant past medical history and  comorbidities include COVID19 with 2 PE found in August 2022 (on eliquis), shortness of breath, history of CVA (2008, affects left side mostly LE), HTN, osteoporosis, Crohn's disease (currently gets infusions), osteopenia, glaucoma, cataract removal, hx of depression (weaning medications with MD supervision), breast cancer (2006), clotting disorder, hypothyroidism, cardiac catheterization.    Examination-Activity Limitations Bed Mobility;Bathing;Hygiene/Grooming;Squat;Stairs;Lift;Bend;Locomotion Level;Stand;Caring for Others;Carry;Transfers;Sit;Dressing;Sleep    Examination-Participation Restrictions Laundry;Cleaning;Shop;Community Activity;Meal Prep;Yard Work;Interpersonal Relationship   walking, sitting, changing positions, bed mobility, household and community mobility, stairs, lifting, moving L LE, house cleaning, shopping cart, sleeping, going to school, exercising.   Stability/Clinical Decision Making Stable/Uncomplicated    Clinical Decision Making Low    Rehab Potential Good    PT Frequency 2x / week    PT Duration 12 weeks    PT Treatment/Interventions ADLs/Self Care Home Management;Aquatic Therapy;Biofeedback;Cryotherapy;Moist Heat;Traction;Electrical Stimulation;DME Instruction;Gait training;Stair training;Functional mobility training;Neuromuscular re-education;Balance training;Therapeutic exercise;Therapeutic activities;Patient/family education;Dry needling;Passive range of motion;Manual techniques;Spinal Manipulations    PT Next Visit Plan update HEP, manual therapy as needed, trunk and LE strenghtening and stretching as tolerated,    PT Home Exercise Plan sciatic nerve glides 1x10 3 times a day    Consulted and Agree with Plan of Care Patient             Patient will benefit from skilled therapeutic intervention in order to improve the following deficits and impairments:  Abnormal gait, Decreased knowledge of use of DME, Impaired sensation, Improper body mechanics, Pain,  Cardiopulmonary status limiting activity, Decreased mobility, Postural dysfunction, Increased muscle spasms, Decreased activity tolerance, Decreased endurance, Decreased range of motion, Decreased strength, Hypomobility, Impaired perceived functional ability, Decreased balance, Difficulty walking, Impaired flexibility  Visit Diagnosis: Chronic left-sided low back pain with left-sided sciatica  Difficulty in walking, not elsewhere classified  Muscle weakness (generalized)  Unsteadiness on feet     Problem List Patient Active Problem List   Diagnosis Date Noted   Low back pain radiating to left leg 06/17/2021   Glaucoma 02/23/2021   Other long term (current) drug therapy 01/22/2021   Varicose veins of both lower extremities with pain 07/19/2018   Arthritis of right hand 12/21/2017   Immunosuppressed status ([place]) 12/21/2017   Major depression in remission ([place]) 12/21/2017   Fibrocystic breast changes 06/22/2017   Osteopenia 10/18/2016   Atherosclerosis of aorta ([place]) 12/19/2015   Vitreous degeneration 05/21/2015   Glaucoma associated with chamber angle anomalies 05/21/2015   Crohn's disease ([place]) 05/21/2015   H/O malignant neoplasm of breast 05/21/2015   Crohn's disease of both small and large intestine with rectal bleeding ([place]) 12/04/2014   Solitary  pulmonary nodule 11/07/2012   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 Graylon Good, PT, DPT 06/17/21, 3:16 PM   [facility] [address] [place], [address] Phone: [phone]   Fax:  [phone]  Name: Carolyn Campbell MRN: [id number] Date of Birth: July 22, 1956

## 2021-06-17 NOTE — Progress Notes (Signed)
Virtual Visit via Video Note  I connected with Carolyn Campbell on 06/17/21 at  8:20 AM EDT by a video enabled telemedicine application and verified that I am speaking with the correct person using two identifiers.  Location: Patient: home Provider: home   I discussed the limitations of evaluation and management by telemedicine and the availability of in person appointments. The patient expressed understanding and agreed to proceed.  History of Present Illness:  LEG, BACK PAIN - currently follows with [facility] PM&R, last injection L5-S1 epidural steroid injection 05/28/21 with last visit 9/21. Per last note, to f/u prn, consider L SIJ injection and L L4-L5 epidural steroid injection.  - also follows with PT - last leg imaging L hip/pelvis XR 9/21, unable to review results. [facility] lumbar spine [facility] at [facility] with and without contrast 10/2020: L2-3 mild bilateral NFS; L3-4 mild bilateral NFS; L4-5 centrally posterior annular fissure with moderate right and mild left NFS and mild CCS; edema of right L4 and L5 pedicles - currently on lyrica, previously on prednisone taper   Duration: chronic Quality:  sharp, aching, and stabbing Location:  back down L hip Bilateral:  yes Frequency: constant, worse recently Time of  day:   at random Paresthesias:   no Decreased sensation:  no Weakness:   yes Alleviating factors: lyrica, tylenol Aggravating factors: getting up, walking Status: worse No loss of bowel or bladder control. No dysuria. No falls or recent injury. No fevers. Does wake from sleep.   Observations/Objective:  Well appearing, in NAD. Sitting at time of exam.  Assessment and Plan:  Low back pain radiating to left leg Chronic, worsening without recent injury or falls. No red flags to warrant emergent imaging. Per PM&R notes, considering steroid injection in SIJ and L4-5 not previously given. Discussed possibility of second opinion referral or making f/u appt with Dr. Alba Destine. Patient  would like to think about it and let us know.      I discussed the assessment and treatment plan with the patient. The patient was provided an opportunity to ask questions and all were answered. The patient agreed with the plan and demonstrated an understanding of the instructions.   The patient was advised to call back or seek an in-person evaluation if the symptoms worsen or if the condition fails to improve as anticipated.  I provided 20 minutes of non-face-to-face time during this encounter.   Myles Gip, DO

## 2021-06-17 NOTE — Assessment & Plan Note (Signed)
Chronic, worsening without recent injury or falls. No red flags to warrant emergent imaging. Per PM&R notes, considering steroid injection in SIJ and L4-5 not previously given. Discussed possibility of second opinion referral or making f/u appt with Dr. Alba Destine. Patient would like to think about it and let us know.

## 2021-06-18 ENCOUNTER — Other Ambulatory Visit: Payer: Self-pay

## 2021-06-18 MED ORDER — ENTYVIO 300 MG IV SOLR: INTRAVENOUS | 5 refills | Status: DC

## 2021-06-18 NOTE — Progress Notes (Signed)
Refaxing

## 2021-06-18 NOTE — Telephone Encounter (Signed)
Refaxed to [facility]

## 2021-06-18 NOTE — Telephone Encounter (Signed)
[employer] calling to check on fax. Carolyn Campbell said fax never received. She said fax number [phone]. Att: Carolyn Campbell

## 2021-06-23 ENCOUNTER — Ambulatory Visit: Payer: Medicare Other | Attending: Physical Medicine & Rehabilitation | Admitting: Physical Therapy

## 2021-06-23 ENCOUNTER — Encounter: Payer: Self-pay | Admitting: Physical Therapy

## 2021-06-23 DIAGNOSIS — G8929 Other chronic pain: Secondary | ICD-10-CM | POA: Insufficient documentation

## 2021-06-23 DIAGNOSIS — R262 Difficulty in walking, not elsewhere classified: Secondary | ICD-10-CM | POA: Diagnosis present

## 2021-06-23 DIAGNOSIS — R2681 Unsteadiness on feet: Secondary | ICD-10-CM | POA: Insufficient documentation

## 2021-06-23 DIAGNOSIS — M5442 Lumbago with sciatica, left side: Secondary | ICD-10-CM | POA: Insufficient documentation

## 2021-06-23 DIAGNOSIS — M6281 Muscle weakness (generalized): Secondary | ICD-10-CM | POA: Diagnosis present

## 2021-06-23 NOTE — Therapy (Signed)
[facility] [address], [place], [address] Phone: [phone]   Fax:  [phone]  Physical Therapy Treatment  Patient Details  Name: Carolyn Campbell MRN: [id number] Date of Birth: 04/15/1956 Referring Provider (PT): Girtha Hake, MD   Encounter Date: 06/23/2021   PT End of Session - 06/23/21 1356     Visit Number 2    Number of Visits 24    Date for PT Re-Evaluation 09/09/21    Authorization Type [employer] MEDICARE reporting period from 06/17/2021    Progress Note Due on Visit 10    PT Start Time 1350    PT Stop Time 1430    PT Time Calculation (min) 40 min    Activity Tolerance Patient tolerated treatment well    Behavior During Therapy [facility] for tasks assessed/performed             Past Medical History:  Diagnosis Date   Abdominal pain, epigastric    Allergic rhinitis    Anginal pain ([place])    states MD said it was GERD   Arthritis    Asthma    Breast cancer ([place]) 2006   LT LUMPECTOMY   CD (Crohn's disease) ([place]) 05/21/2015   FOLLOWED BY GI    Clotting disorder ([place])    Cognitive deficits as late effect of cerebrovascular disease    COPD (chronic obstructive pulmonary disease) ([place])    Crohn's disease of both small and large intestine with rectal bleeding ([place]) 12/04/2014   Depression    Currently taking zoloft.   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    Dyspnea    Esophageal reflux    Fever blister 07/19/2018   Glaucoma    vitreous degeneration   History of kidney stones    Hyperlipidemia    Hypertension    Hypothyroidism    Occlusion, cerebral artery    NOS w/infarction   Osteoporosis    Personal history of radiation therapy 2006   BREAST CA   Stroke [facility])    Ulcer     Past Surgical History:  Procedure Laterality Date   BREAST BIOPSY Left 2006   POS   BREAST LUMPECTOMY Left 09/20/2004   positive   BREAST SURGERY Left     malignant biopsy   CARDIAC CATHETERIZATION     COLONOSCOPY  2015   COLONOSCOPY WITH PROPOFOL N/A 06/08/2017   Procedure: COLONOSCOPY WITH PROPOFOL;  Surgeon: Lin Landsman, MD;  Location: [place];  Service: Gastroenterology;  Laterality: N/A;   COLONOSCOPY WITH PROPOFOL N/A 03/08/2019   Procedure: COLONOSCOPY WITH PROPOFOL;  Surgeon: Lin Landsman, MD;  Location: [facility] ENDOSCOPY;  Service: Gastroenterology;  Laterality: N/A;   COLONOSCOPY WITH PROPOFOL N/A 07/16/2020   Procedure: COLONOSCOPY WITH PROPOFOL;  Surgeon: Lin Landsman, MD;  Location: [facility] ENDOSCOPY;  Service: Gastroenterology;  Laterality: N/A;   ESOPHAGOGASTRODUODENOSCOPY  2015   ESOPHAGOGASTRODUODENOSCOPY (EGD) WITH PROPOFOL N/A 06/08/2017   Procedure: ESOPHAGOGASTRODUODENOSCOPY (EGD) WITH PROPOFOL;  Surgeon: Lin Landsman, MD;  Location: [place];  Service: Gastroenterology;  Laterality: N/A;   ESOPHAGOGASTRODUODENOSCOPY (EGD) WITH PROPOFOL N/A 03/08/2019   Procedure: ESOPHAGOGASTRODUODENOSCOPY (EGD) WITH PROPOFOL;  Surgeon: Lin Landsman, MD;  Location: [facility] ENDOSCOPY;  Service: Gastroenterology;  Laterality: N/A;   FINGER SURGERY     Right small finger   FRACTURE SURGERY     GIVENS CAPSULE STUDY  2015   TUBAL LIGATION  There were no vitals filed for this visit.   Subjective Assessment - 06/23/21 1355     Subjective Patient reports her pain is better today, 2/10 in her left lower leg. States she has been doing the nerve glide and also got a firmer matress that she feels is really helping her.    Pertinent History Patient is a 65 y.o. 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 This patient's chief complaints consist of left buttock pain with radiation into L LE to foot  and up to low back leading to the following functional deficits: difficulty with usual activities such as walking, sitting, changing positions, bed mobility, household and community mobility, stairs, lifting, moving L LE, house cleaning, shopping cart, sleeping, going to school, exercising. .  Relevant past medical history and comorbidities include COVID19 with 2 PE found in August 2022 (on eliquis), shortness of breath, history of CVA (2008, affects left side mostly LE), HTN, osteoporosis, Crohn's disease (currently gets infusions), osteopenia, glaucoma, cataract removal, hx of depression (weaning medications with MD supervision), breast cancer (2006), clotting disorder, hypothyroidism, cardiac catheterization. Patient denies hx of seizures, lung problem, diabetes, unexplained weight loss, changes in bowel or bladder problems, new onset stumbling or dropping things apart from history (L leg feels like it gives out more since leg pain), history of spinal surgery.    Limitations Lifting;Standing;Walking;House hold activities   walking, sitting, changing positions, bed mobility, household and community mobility, stairs, lifting, moving L LE, house cleaning, shopping cart, sleeping, going to school, exercising.   Diagnostic tests Lumbar MRI report 11/02/2020: "IMPRESSION:  Lumbar spondylosis as outlined.  At L4-L5, there is mild/moderate disc degeneration. Grade 1  anterolisthesis. Posterior annular fissure. Disc uncovering with  disc bulge. Moderate facet arthrosis with mild ligamentum flavum  hypertrophy. Trace right facet joint effusion. Bilateral neural  foraminal narrowing (moderate right, mild left).  No more than mild spinal canal or neural foraminal narrowing at the  remaining levels.  Edema and enhancement within the right L4 and L5 pedicles/articular  pillars, which may be degenerative or may reflect stress reaction.  Degenerative marrow edema and enhancement along the right L4-L5  facet joint."    Chest CT  report 05/05/2021: "IMPRESSION:  Small filling defects are noted in upper and lower lobe branches of  the left pulmonary artery consistent with acute pulmonary emboli.  Critical Value/emergent results were called by telephone at the time  of interpretation on 05/05/2021 at 3:08 pm to provider Lavonia Drafts  , who verbally acknowledged these results.  Aortic Atherosclerosis (ICD10-I70.0)."    Patient Stated Goals want to be able to move around without hurting so much    Currently in Pain? Yes    Pain Score 2     Pain Onset More than a month ago                 TREATMENT:   denies sensitivity to latex  Manual therapy: to reduce pain and tissue tension, improve range of motion, neuromodulation, in order to promote improved ability to complete functional activities. PRONE with head in table face cradle and pillow under ankles - STM to bilateral lumbar paraspinals, left glute max/med/min, left deep external hip rotators - CPT grade III-IV, T10-L5, (most tender at lowest levels, provokes L LE pain near L3-L4.   Modality: (unbilled)  Dry needling performed to left lumbar spine to decrease pain and spasms along patient's lumbar and L LE region with patient in prone utilizing (1) dry needle(s) .33m x 722mwith (2) sticks at left lumbar multifidi at ~ L4 and L5 . Patient educated about the risks and benefits from therapy and verbally consents to treatment.  Dry needling performed by SaEverlean AlstromSnGraylon GoodT, DPT who is certified in this technique.  Therapeutic exercise: to centralize symptoms and improve ROM, strength, muscular endurance, and activity tolerance required for successful completion of functional activities.   - left side glide at wall (R side toward wall), 2x20 (walking in between demo improvement).  (Manual therapy and needling - see above).  - supine sciatic nerve glide, 1x15 left side - hooklying glute bridge, 5 second holds, 1x20 - hooklying clamshell with RTB around knees, 5 second  holds, 1x20 - Education on HEP including handout   Pt required multimodal cuing for proper technique and to facilitate improved neuromuscular control, strength, range of motion, and functional ability resulting in improved performance and form.   HOME EXERCISE PROGRAM Access Code: [email or web address] URL: [email or web address] Date: 06/23/2021 Prepared by: SaRosita KeaExercises Right Standing Lateral Shift Correction at Wall - Repetitions - 3 x daily - 20 reps - or when pain is present - 1 sets Supine 90/90 Sciatic Nerve Glide with Knee Flexion/Extension - 1-2 x daily - 15 reps Bridge - 1 x daily - 1 sets - 20 reps - 5 seconds hold Hooklying Clamshell with Resistance - 1 x daily - 1 sets - 20 reps - 5 seconds hold   PT Education - 06/23/21 1356     Education Details Exercise purpose/form. Self management techniques    Person(s) Educated Patient    Methods Explanation;Demonstration;Tactile cues;Verbal cues    Comprehension Verbalized understanding;Returned demonstration;Verbal cues required;Tactile cues required;Need further instruction              PT Short Term Goals - 06/17/21 1507       PT SHORT TERM GOAL #1   Title Be independent with initial home exercise program for self-management of symptoms.    Baseline Initial HEP provided at IE (06/17/2021);    Time 2    Period Weeks    Status New    Target Date 07/01/21               PT Long Term Goals - 06/17/21 1507       PT LONG TERM GOAL #1   Title Be independent with a long-term home exercise program for self-management of symptoms.    Baseline Initial HEP provided at IE (06/17/2021);    Time 12    Period Weeks    Status New   TARGET DATE FOR ALL LONG TERM GOALS: 09/09/2021     PT LONG TERM GOAL #2   Title Demonstrate improved FOTO score to equal or greater than 50 by visit #12 to demonstrate improvement in overall condition and self-reported functional ability.    Baseline 40 (06/17/2021);    Time 12     Period Weeks    Status New      PT LONG TERM GOAL #3   Title Have full lumbar AROM with no compensations or increase in pain in all planes except intermittent end range discomfort to allow patient to complete valued activities with less difficulty.    Baseline painful and limited - see objective exam (06/17/2021);    Time 12    Period Weeks  Status New      PT LONG TERM GOAL #4   Title Reduce pain with functional activities to equal or less than 2/10 to allow patient to complete usual activities including ADLs, IADLs, and social engagement with less difficulty.    Baseline up to 10/10 (06/17/2021);    Time 12    Period Weeks    Status New      PT LONG TERM GOAL #5   Title Complete community, work and/or recreational activities without limitation due to current condition.    Baseline Functional Limitations: walking, sitting, changing positions, bed mobility, household and community mobility, stairs, lifting, moving L LE, house cleaning, shopping cart, sleeping, going to school, exercising.    Time 12    Period Weeks    Status New      Additional Long Term Goals   Additional Long Term Goals Yes      PT LONG TERM GOAL #6   Title Patient will ambulate equal or greater than 1000 feet with no AD on 6 Minute Walk Test to demonstrate improved household and community mobility.    Baseline unable to tolerate test - difficulty walking ~ 50 feet from/to waiting room with [facility] (06/17/2021);    Time 12    Period Weeks    Status New                   Plan - 06/23/21 1430     Clinical Impression Statement Patient tolerated treatment well overall and reports feeling a little better afterwards. Does have a lot of tenderness and tightness to palpation in lower back and glute musculature, L > R. Updated HEP appropriately. Patient would benefit from continued management of limiting condition by skilled physical therapist to address remaining impairments and functional limitations to work  towards stated goals and return to PLOF or maximal functional independence.    Personal Factors and Comorbidities Age;Past/Current Experience;Comorbidity 3+;Fitness;Time since onset of injury/illness/exacerbation    Comorbidities Relevant past medical history and comorbidities include COVID19 with 2 PE found in August 2022 (on eliquis), shortness of breath, history of CVA (2008, affects left side mostly LE), HTN, osteoporosis, Crohn's disease (currently gets infusions), osteopenia, glaucoma, cataract removal, hx of depression (weaning medications with MD supervision), breast cancer (2006), clotting disorder, hypothyroidism, cardiac catheterization.    Examination-Activity Limitations Bed Mobility;Bathing;Hygiene/Grooming;Squat;Stairs;Lift;Bend;Locomotion Level;Stand;Caring for Others;Carry;Transfers;Sit;Dressing;Sleep    Examination-Participation Restrictions Laundry;Cleaning;Shop;Community Activity;Meal Prep;Yard Work;Interpersonal Relationship   walking, sitting, changing positions, bed mobility, household and community mobility, stairs, lifting, moving L LE, house cleaning, shopping cart, sleeping, going to school, exercising.   Stability/Clinical Decision Making Stable/Uncomplicated    Rehab Potential Good    PT Frequency 2x / week    PT Duration 12 weeks    PT Treatment/Interventions ADLs/Self Care Home Management;Aquatic Therapy;Biofeedback;Cryotherapy;Moist Heat;Traction;Electrical Stimulation;DME Instruction;Gait training;Stair training;Functional mobility training;Neuromuscular re-education;Balance training;Therapeutic exercise;Therapeutic activities;Patient/family education;Dry needling;Passive range of motion;Manual techniques;Spinal Manipulations    PT Next Visit Plan update HEP as appropriate, manual therapy as needed, trunk and LE strenghtening and stretching as tolerated,    PT Home Exercise Plan [place] Access Code: [email or web address]    Consulted and Agree with Plan of Care Patient              Patient will benefit from skilled therapeutic intervention in order to improve the following deficits and impairments:  Abnormal gait, Decreased knowledge of use of DME, Impaired sensation, Improper body mechanics, Pain, Cardiopulmonary status limiting activity, Decreased mobility, Postural dysfunction, Increased muscle spasms, Decreased activity tolerance, Decreased endurance,  Decreased range of motion, Decreased strength, Hypomobility, Impaired perceived functional ability, Decreased balance, Difficulty walking, Impaired flexibility  Visit Diagnosis: Chronic left-sided low back pain with left-sided sciatica  Difficulty in walking, not elsewhere classified  Muscle weakness (generalized)  Unsteadiness on feet     Problem List Patient Active Problem List   Diagnosis Date Noted   Low back pain radiating to left leg 06/17/2021   Glaucoma 02/23/2021   Other long term (current) drug therapy 01/22/2021   Varicose veins of both lower extremities with pain 07/19/2018   Arthritis of right hand 12/21/2017   Immunosuppressed status ([place]) 12/21/2017   Major depression in remission ([place]) 12/21/2017   Fibrocystic breast changes 06/22/2017   Osteopenia 10/18/2016   Atherosclerosis of aorta ([place]) 12/19/2015   Vitreous degeneration 05/21/2015   Glaucoma associated with chamber angle anomalies 05/21/2015   Crohn's disease ([place]) 05/21/2015   H/O malignant neoplasm of breast 05/21/2015   Crohn's disease of both small and large intestine with rectal bleeding ([place]) 12/04/2014   Solitary pulmonary nodule 11/07/2012   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 Graylon Good, PT, DPT 06/23/21, 2:32 PM   [facility] [address] [place], [address] Phone: [phone]   Fax:  [phone]  Name: Carolyn Campbell MRN: [id number] Date of Birth: 1956/07/10

## 2021-06-25 ENCOUNTER — Encounter: Payer: Self-pay | Admitting: Physical Therapy

## 2021-06-25 ENCOUNTER — Ambulatory Visit: Payer: Medicare Other | Admitting: Physical Therapy

## 2021-06-25 DIAGNOSIS — M6281 Muscle weakness (generalized): Secondary | ICD-10-CM

## 2021-06-25 DIAGNOSIS — R2681 Unsteadiness on feet: Secondary | ICD-10-CM

## 2021-06-25 DIAGNOSIS — M5442 Lumbago with sciatica, left side: Secondary | ICD-10-CM | POA: Diagnosis not present

## 2021-06-25 DIAGNOSIS — G8929 Other chronic pain: Secondary | ICD-10-CM

## 2021-06-25 DIAGNOSIS — R262 Difficulty in walking, not elsewhere classified: Secondary | ICD-10-CM

## 2021-06-25 NOTE — Therapy (Signed)
[facility] [address], [place], [address] Phone: [phone]   Fax:  [phone]  Physical Therapy Treatment  Patient Details  Name: SAMEEN LEAS MRN: [id number] Date of Birth: [birth year] Referring Provider (PT): Girtha Hake, MD   Encounter Date: 06/25/2021   PT End of Session - 06/25/21 1742     Visit Number 3    Number of Visits 24    Date for PT Re-Evaluation 09/09/21    Authorization Type [employer] MEDICARE reporting period from 06/17/2021    Progress Note Due on Visit 10    PT Start Time 1735    PT Stop Time 1815    PT Time Calculation (min) 40 min    Activity Tolerance Patient tolerated treatment well    Behavior During Therapy [facility] for tasks assessed/performed             Past Medical History:  Diagnosis Date   Abdominal pain, epigastric    Allergic rhinitis    Anginal pain ([place])    states MD said it was GERD   Arthritis    Asthma    Breast cancer ([place]) 2006   LT LUMPECTOMY   CD (Crohn's disease) ([place]) 05/21/2015   FOLLOWED BY GI    Clotting disorder ([place])    Cognitive deficits as late effect of cerebrovascular disease    COPD (chronic obstructive pulmonary disease) ([place])    Crohn's disease of both small and large intestine with rectal bleeding ([place]) 12/04/2014   Depression    Currently taking zoloft.   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    Dyspnea    Esophageal reflux    Fever blister 07/19/2018   Glaucoma    vitreous degeneration   History of kidney stones    Hyperlipidemia    Hypertension    Hypothyroidism    Occlusion, cerebral artery    NOS w/infarction   Osteoporosis    Personal history of radiation therapy 2006   BREAST CA   Stroke [facility])    Ulcer     Past Surgical History:  Procedure Laterality Date   BREAST BIOPSY Left 2006   POS   BREAST LUMPECTOMY Left 09/20/2004   positive   BREAST SURGERY Left     malignant biopsy   CARDIAC CATHETERIZATION     COLONOSCOPY  2015   COLONOSCOPY WITH PROPOFOL N/A 06/08/2017   Procedure: COLONOSCOPY WITH PROPOFOL;  Surgeon: Lin Landsman, MD;  Location: [place];  Service: Gastroenterology;  Laterality: N/A;   COLONOSCOPY WITH PROPOFOL N/A 03/08/2019   Procedure: COLONOSCOPY WITH PROPOFOL;  Surgeon: Lin Landsman, MD;  Location: [facility] ENDOSCOPY;  Service: Gastroenterology;  Laterality: N/A;   COLONOSCOPY WITH PROPOFOL N/A 07/16/2020   Procedure: COLONOSCOPY WITH PROPOFOL;  Surgeon: Lin Landsman, MD;  Location: [facility] ENDOSCOPY;  Service: Gastroenterology;  Laterality: N/A;   ESOPHAGOGASTRODUODENOSCOPY  2015   ESOPHAGOGASTRODUODENOSCOPY (EGD) WITH PROPOFOL N/A 06/08/2017   Procedure: ESOPHAGOGASTRODUODENOSCOPY (EGD) WITH PROPOFOL;  Surgeon: Lin Landsman, MD;  Location: [place];  Service: Gastroenterology;  Laterality: N/A;   ESOPHAGOGASTRODUODENOSCOPY (EGD) WITH PROPOFOL N/A 03/08/2019   Procedure: ESOPHAGOGASTRODUODENOSCOPY (EGD) WITH PROPOFOL;  Surgeon: Lin Landsman, MD;  Location: [facility] ENDOSCOPY;  Service: Gastroenterology;  Laterality: N/A;   FINGER SURGERY     Right small finger   FRACTURE SURGERY     GIVENS CAPSULE STUDY  2015   TUBAL LIGATION  There were no vitals filed for this visit.   Subjective Assessment - 06/25/21 1737     Subjective Patient arrives with [facility] and has difficulty getting up from waiting room chair and walking due to pain. States she awoke this way and has pain in the right leg to the ankle of about 3/10. Gait is improving as she walks towards the treatment room. States she felt pretty good yesterday. State she still has the most trouble when she first gets up from sitting. Could not tell that dry needling helped her at all    Pertinent History Patient is a 65 y.o. 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 This patient's chief complaints consist of left buttock pain with radiation into L LE to foot and up to low back leading to the following functional deficits: difficulty with usual activities such as walking, sitting, changing positions, bed mobility, household and community mobility, stairs, lifting, moving L LE, house cleaning, shopping cart, sleeping, going to school, exercising. .  Relevant past medical history and comorbidities include COVID19 with 2 PE found in August 2022 (on eliquis), shortness of breath, history of CVA (2008, affects left side mostly LE), HTN, osteoporosis, Crohn's disease (currently gets infusions), osteopenia, glaucoma, cataract removal, hx of depression (weaning medications with MD supervision), breast cancer (2006), clotting disorder, hypothyroidism, cardiac catheterization. Patient denies hx of seizures, lung problem, diabetes, unexplained weight loss, changes in bowel or bladder problems, new onset stumbling or dropping things apart from history (L leg feels like it gives out more since leg pain), history of spinal surgery.    Limitations Lifting;Standing;Walking;House hold activities   walking, sitting, changing positions, bed mobility, household and community mobility, stairs, lifting, moving L LE, house cleaning, shopping cart, sleeping, going to school, exercising.   Diagnostic tests Lumbar MRI report 11/02/2020: "IMPRESSION:  Lumbar spondylosis as outlined.  At L4-L5, there is mild/moderate disc degeneration. Grade 1  anterolisthesis. Posterior annular fissure. Disc uncovering with  disc bulge. Moderate facet arthrosis with mild ligamentum flavum  hypertrophy. Trace right facet joint effusion. Bilateral neural  foraminal narrowing (moderate right, mild left).  No more than mild spinal canal or neural foraminal narrowing at the  remaining  levels.  Edema and enhancement within the right L4 and L5 pedicles/articular  pillars, which may be degenerative or may reflect stress reaction.  Degenerative marrow edema and enhancement along the right L4-L5  facet joint."    Chest CT report 05/05/2021: "IMPRESSION:  Small filling defects are noted in upper and lower lobe branches of  the left pulmonary artery consistent with acute pulmonary emboli.  Critical Value/emergent results were called by telephone at the time  of interpretation on 05/05/2021 at 3:08 pm to provider Lavonia Drafts  , who verbally acknowledged these results.  Aortic Atherosclerosis (ICD10-I70.0)."    Patient Stated Goals want to be able to move around without hurting so much    Currently in Pain? Yes    Pain Score 3     Pain Onset More than a month ago             TREATMENT:   denies sensitivity to latex   Manual therapy: to reduce pain and tissue tension, improve range of motion, neuromodulation, in order to promote improved ability to complete functional activities. PRONE with head in table face  cradle and pillow under ankles - STM to bilateral lumbar paraspinals, left glute max/med/min, left deep external hip rotators - CPT and left UPA grade III-IV, L1-L5, (most tender at lowest levels, provokes L LE pain near L3-L4.  SUPINE  - LAD through left hip, 3x30 seconds (relieved pain)   Therapeutic exercise: to centralize symptoms and improve ROM, strength, muscular endurance, and activity tolerance required for successful completion of functional activities.   - left side glide at wall (R side toward wall), 2x20 (walking in between demo improvement).  (Manual therapy and needling - see above).  - supine sciatic nerve glide, 1x15 left side - hooklying clamshell with RTB around knees, 5 second holds, 1x20 - hooklying glute bridge, 5 second holds, 1x20 - hooklying abdominal brace with alternating LE marching (extension provoked symptoms), 2x10 each side - quadruped bird  dog 2x10 (needed a seated rest afterwards due to difficulty breathing)  Pt required multimodal cuing for proper technique and to facilitate improved neuromuscular control, strength, range of motion, and functional ability resulting in improved performance and form.     HOME EXERCISE PROGRAM Access Code: [email or web address] URL: [email or web address] Date: 06/23/2021 Prepared by: Rosita Kea   Exercises Right Standing Lateral Shift Correction at Wall - Repetitions - 3 x daily - 20 reps - or when pain is present - 1 sets Supine 90/90 Sciatic Nerve Glide with Knee Flexion/Extension - 1-2 x daily - 15 reps Bridge - 1 x daily - 1 sets - 20 reps - 5 seconds hold Hooklying Clamshell with Resistance - 1 x daily - 1 sets - 20 reps - 5 seconds hold    PT Education - 06/25/21 1742     Education Details Exercise purpose/form. Self management techniques    Person(s) Educated Patient    Methods Explanation;Demonstration;Tactile cues;Verbal cues    Comprehension Verbalized understanding;Returned demonstration;Verbal cues required;Tactile cues required;Need further instruction              PT Short Term Goals - 06/17/21 1507       PT SHORT TERM GOAL #1   Title Be independent with initial home exercise program for self-management of symptoms.    Baseline Initial HEP provided at IE (06/17/2021);    Time 2    Period Weeks    Status New    Target Date 07/01/21               PT Long Term Goals - 06/17/21 1507       PT LONG TERM GOAL #1   Title Be independent with a long-term home exercise program for self-management of symptoms.    Baseline Initial HEP provided at IE (06/17/2021);    Time 12    Period Weeks    Status New   TARGET DATE FOR ALL LONG TERM GOALS: 09/09/2021     PT LONG TERM GOAL #2   Title Demonstrate improved FOTO score to equal or greater than 50 by visit #12 to demonstrate improvement in overall condition and self-reported functional ability.    Baseline 40  (06/17/2021);    Time 12    Period Weeks    Status New      PT LONG TERM GOAL #3   Title Have full lumbar AROM with no compensations or increase in pain in all planes except intermittent end range discomfort to allow patient to complete valued activities with less difficulty.    Baseline painful and limited - see objective exam (06/17/2021);    Time 12    Period  Weeks    Status New      PT LONG TERM GOAL #4   Title Reduce pain with functional activities to equal or less than 2/10 to allow patient to complete usual activities including ADLs, IADLs, and social engagement with less difficulty.    Baseline up to 10/10 (06/17/2021);    Time 12    Period Weeks    Status New      PT LONG TERM GOAL #5   Title Complete community, work and/or recreational activities without limitation due to current condition.    Baseline Functional Limitations: walking, sitting, changing positions, bed mobility, household and community mobility, stairs, lifting, moving L LE, house cleaning, shopping cart, sleeping, going to school, exercising.    Time 12    Period Weeks    Status New      Additional Long Term Goals   Additional Long Term Goals Yes      PT LONG TERM GOAL #6   Title Patient will ambulate equal or greater than 1000 feet with no AD on 6 Minute Walk Test to demonstrate improved household and community mobility.    Baseline unable to tolerate test - difficulty walking ~ 50 feet from/to waiting room with [facility] (06/17/2021);    Time 12    Period Weeks    Status New                   Plan - 06/25/21 1806     Clinical Impression Statement Patient tolerated treatment well overall and reported significant reduction in symptoms with long axis distraction through the left hip. Patient was able to progress to more abdominal/core exercises to improve lumbar stability. Patient would benefit from continued management of limiting condition by skilled physical therapist to address remaining impairments  and functional limitations to work towards stated goals and return to PLOF or maximal functional independence.    Personal Factors and Comorbidities Age;Past/Current Experience;Comorbidity 3+;Fitness;Time since onset of injury/illness/exacerbation    Comorbidities Relevant past medical history and comorbidities include COVID19 with 2 PE found in August 2022 (on eliquis), shortness of breath, history of CVA (2008, affects left side mostly LE), HTN, osteoporosis, Crohn's disease (currently gets infusions), osteopenia, glaucoma, cataract removal, hx of depression (weaning medications with MD supervision), breast cancer (2006), clotting disorder, hypothyroidism, cardiac catheterization.    Examination-Activity Limitations Bed Mobility;Bathing;Hygiene/Grooming;Squat;Stairs;Lift;Bend;Locomotion Level;Stand;Caring for Others;Carry;Transfers;Sit;Dressing;Sleep    Examination-Participation Restrictions Laundry;Cleaning;Shop;Community Activity;Meal Prep;Yard Work;Interpersonal Relationship   walking, sitting, changing positions, bed mobility, household and community mobility, stairs, lifting, moving L LE, house cleaning, shopping cart, sleeping, going to school, exercising.   Stability/Clinical Decision Making Stable/Uncomplicated    Rehab Potential Good    PT Frequency 2x / week    PT Duration 12 weeks    PT Treatment/Interventions ADLs/Self Care Home Management;Aquatic Therapy;Biofeedback;Cryotherapy;Moist Heat;Traction;Electrical Stimulation;DME Instruction;Gait training;Stair training;Functional mobility training;Neuromuscular re-education;Balance training;Therapeutic exercise;Therapeutic activities;Patient/family education;Dry needling;Passive range of motion;Manual techniques;Spinal Manipulations    PT Next Visit Plan update HEP as appropriate, manual therapy as needed, trunk and LE strenghtening and stretching as tolerated,    PT Home Exercise Plan [place] Access Code: [email or web address]    Consulted and Agree  with Plan of Care Patient             Patient will benefit from skilled therapeutic intervention in order to improve the following deficits and impairments:  Abnormal gait, Decreased knowledge of use of DME, Impaired sensation, Improper body mechanics, Pain, Cardiopulmonary status limiting activity, Decreased mobility, Postural dysfunction, Increased muscle spasms, Decreased activity tolerance,  Decreased endurance, Decreased range of motion, Decreased strength, Hypomobility, Impaired perceived functional ability, Decreased balance, Difficulty walking, Impaired flexibility  Visit Diagnosis: Chronic left-sided low back pain with left-sided sciatica  Difficulty in walking, not elsewhere classified  Muscle weakness (generalized)  Unsteadiness on feet     Problem List Patient Active Problem List   Diagnosis Date Noted   Low back pain radiating to left leg 06/17/2021   Glaucoma 02/23/2021   Other long term (current) drug therapy 01/22/2021   Varicose veins of both lower extremities with pain 07/19/2018   Arthritis of right hand 12/21/2017   Immunosuppressed status ([place]) 12/21/2017   Major depression in remission ([place]) 12/21/2017   Fibrocystic breast changes 06/22/2017   Osteopenia 10/18/2016   Atherosclerosis of aorta ([place]) 12/19/2015   Vitreous degeneration 05/21/2015   Glaucoma associated with chamber angle anomalies 05/21/2015   Crohn's disease ([place]) 05/21/2015   H/O malignant neoplasm of breast 05/21/2015   Crohn's disease of both small and large intestine with rectal bleeding ([place]) 12/04/2014   Solitary pulmonary nodule 11/07/2012   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 Graylon Good, PT, DPT 06/25/21,  6:11 PM   [facility] [address] [place], [address] Phone: [phone]   Fax:  [phone]  Name: HELYN SCHWAN MRN: [id number] Date of Birth: February 19, 1956

## 2021-06-30 ENCOUNTER — Encounter: Payer: Medicare Other | Admitting: Physical Therapy

## 2021-07-01 ENCOUNTER — Emergency Department: Payer: Medicare Other

## 2021-07-01 ENCOUNTER — Ambulatory Visit: Payer: Medicare Other | Admitting: Physical Therapy

## 2021-07-01 ENCOUNTER — Encounter: Payer: Self-pay | Admitting: Physical Therapy

## 2021-07-01 ENCOUNTER — Emergency Department
Admission: EM | Admit: 2021-07-01 | Discharge: 2021-07-01 | Disposition: A | Discharge status: Home / Self Care | Payer: Medicare Other | Attending: Emergency Medicine | Admitting: Emergency Medicine

## 2021-07-01 ENCOUNTER — Other Ambulatory Visit: Payer: Self-pay

## 2021-07-01 VITALS — BP 168/60 | HR 84

## 2021-07-01 DIAGNOSIS — R0602 Shortness of breath: Secondary | ICD-10-CM | POA: Insufficient documentation

## 2021-07-01 DIAGNOSIS — Z7901 Long term (current) use of anticoagulants: Secondary | ICD-10-CM | POA: Diagnosis not present

## 2021-07-01 DIAGNOSIS — G8929 Other chronic pain: Secondary | ICD-10-CM

## 2021-07-01 DIAGNOSIS — M5442 Lumbago with sciatica, left side: Secondary | ICD-10-CM

## 2021-07-01 DIAGNOSIS — M79605 Pain in left leg: Secondary | ICD-10-CM | POA: Insufficient documentation

## 2021-07-01 DIAGNOSIS — J45909 Unspecified asthma, uncomplicated: Secondary | ICD-10-CM | POA: Insufficient documentation

## 2021-07-01 DIAGNOSIS — Z79899 Other long term (current) drug therapy: Secondary | ICD-10-CM | POA: Diagnosis not present

## 2021-07-01 DIAGNOSIS — R0789 Other chest pain: Secondary | ICD-10-CM | POA: Insufficient documentation

## 2021-07-01 DIAGNOSIS — I10 Essential (primary) hypertension: Secondary | ICD-10-CM | POA: Insufficient documentation

## 2021-07-01 DIAGNOSIS — E039 Hypothyroidism, unspecified: Secondary | ICD-10-CM | POA: Insufficient documentation

## 2021-07-01 DIAGNOSIS — M6281 Muscle weakness (generalized): Secondary | ICD-10-CM

## 2021-07-01 DIAGNOSIS — R2681 Unsteadiness on feet: Secondary | ICD-10-CM

## 2021-07-01 DIAGNOSIS — J449 Chronic obstructive pulmonary disease, unspecified: Secondary | ICD-10-CM | POA: Diagnosis not present

## 2021-07-01 DIAGNOSIS — R262 Difficulty in walking, not elsewhere classified: Secondary | ICD-10-CM

## 2021-07-01 DIAGNOSIS — Z853 Personal history of malignant neoplasm of breast: Secondary | ICD-10-CM | POA: Diagnosis not present

## 2021-07-01 LAB — CBC
HCT: 38.3 % (ref 36.0–46.0)
Hemoglobin: 12.8 g/dL (ref 12.0–15.0)
MCH: 30.3 pg (ref 26.0–34.0)
MCHC: 33.4 g/dL (ref 30.0–36.0)
MCV: 90.5 fL (ref 80.0–100.0)
Platelets: 205 10*3/uL (ref 150–400)
RBC: 4.23 MIL/uL (ref 3.87–5.11)
RDW: 13.3 % (ref 11.5–15.5)
WBC: 6.4 10*3/uL (ref 4.0–10.5)
nRBC: 0 % (ref 0.0–0.2)

## 2021-07-01 LAB — BASIC METABOLIC PANEL
Anion gap: 12 (ref 5–15)
BUN: 9 mg/dL (ref 8–23)
CO2: 29 mmol/L (ref 22–32)
Calcium: 10.8 mg/dL — ABNORMAL HIGH (ref 8.9–10.3)
Chloride: 98 mmol/L (ref 98–111)
Creatinine, Ser: 0.75 mg/dL (ref 0.44–1.00)
GFR, Estimated: >60 mL/min (ref >60)
Glucose, Bld: 107 mg/dL — ABNORMAL HIGH (ref 70–99)
Potassium: 3.7 mmol/L (ref 3.5–5.1)
Sodium: 139 mmol/L (ref 135–145)

## 2021-07-01 LAB — TROPONIN I (HIGH SENSITIVITY): Troponin I (High Sensitivity): 4 ng/L (ref <18)

## 2021-07-01 MED ORDER — ALBUTEROL SULFATE HFA 108 (90 BASE) MCG/ACT IN AERS: 2.0000 | INHALATION_SPRAY | RESPIRATORY_TRACT | 0 refills | Status: DC | PRN

## 2021-07-01 MED ORDER — ALBUTEROL SULFATE (2.5 MG/3ML) 0.083% IN NEBU: 2.5000 mg | INHALATION_SOLUTION | Freq: Once | RESPIRATORY_TRACT | Status: DC

## 2021-07-01 NOTE — ED Provider Notes (Signed)
[facility] Emergency Department Provider Note ____________________________________________   Event Date/Time   First MD Initiated Contact with Patient 07/01/21 1137     (approximate)  I have reviewed the triage vital signs and the nursing notes.   HISTORY  Chief Complaint Chest Pain    HPI Carolyn Campbell is a 65 y.o. female with PMH as noted below including asthma COPD, and PE on Eliquis presents with chest discomfort and shortness of breath which have been ongoing for several months.  The patient states that the symptoms are intermittent and mainly occur when she exerts herself.  She states that she has seen multiple doctors for this without getting a definitive answer.  She states that she has not taken anything for it.  She was told not to use her albuterol because she did not have any wheezing.  The patient states that today while walking she had an episode of this pain, however there has been no change in this symptom in the last few months.  She states that the reason she came to the ED was because she went to a neurology appointment and had the symptoms.  She was then sent to the internal medicine office, and subsequently to the ED because she appeared short of breath.  The patient denies fever, cough, vomiting or diarrhea.  The patient denies active chest pain currently.  She states that she also has had left leg pain ongoing for months and has had steroid shots and some test to check it out, but also has no answer as to what is going on with it.  She denies any acute change in this pain in the last few days or weeks and has no swelling.  Past Medical History:  Diagnosis Date   Abdominal pain, epigastric    Allergic rhinitis    Anginal pain ([place])    states MD said it was GERD   Arthritis    Asthma    Breast cancer ([place]) 2006   LT LUMPECTOMY   CD (Crohn's disease) ([place]) 05/21/2015   FOLLOWED BY GI    Clotting disorder (HCC)    Cognitive deficits  as late effect of cerebrovascular disease    COPD (chronic obstructive pulmonary disease) (HCC)    Crohn's disease of both small and large intestine with rectal bleeding ([place]) 12/04/2014   Depression    Currently taking zoloft.   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    Dyspnea    Esophageal reflux    Fever blister 07/19/2018   Glaucoma    vitreous degeneration   History of kidney stones    Hyperlipidemia    Hypertension    Hypothyroidism    Occlusion, cerebral artery    NOS w/infarction   Osteoporosis    Personal history of radiation therapy 2006   BREAST CA   Stroke [facility])    Ulcer     Patient Active Problem List   Diagnosis Date Noted   Low back pain radiating to left leg 06/17/2021   Glaucoma 02/23/2021   Other long term (current) drug therapy 01/22/2021   Varicose veins of both lower extremities with pain 07/19/2018   Arthritis of right hand 12/21/2017   Immunosuppressed status ([place]) 12/21/2017   Major depression in remission ([place]) 12/21/2017   Fibrocystic breast changes 06/22/2017   Osteopenia 10/18/2016   Atherosclerosis of aorta ([place]) 12/19/2015   Vitreous degeneration 05/21/2015   Glaucoma associated with chamber angle anomalies 05/21/2015  Crohn's disease ([place]) 05/21/2015   H/O malignant neoplasm of breast 05/21/2015   Crohn's disease of both small and large intestine with rectal bleeding ([place]) 12/04/2014   Solitary pulmonary nodule 11/07/2012   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 04/22/2010   Cognitive deficits as late effect of cerebrovascular disease 01/20/2010   CVA, old, hemiparesis ([place]) 04/28/2009   Hypothyroidism 06/12/2008   Acid reflux 05/23/2007   Hypercholesterolemia 01/18/2007   Benign essential HTN 01/18/2007    Past Surgical History:  Procedure Laterality Date   BREAST BIOPSY Left 2006   POS   BREAST LUMPECTOMY Left 09/20/2004   positive   BREAST SURGERY Left     malignant biopsy   CARDIAC CATHETERIZATION     COLONOSCOPY  2015   COLONOSCOPY WITH PROPOFOL N/A 06/08/2017   Procedure: COLONOSCOPY WITH PROPOFOL;  Surgeon: Lin Landsman, MD;  Location: [place];  Service: Gastroenterology;  Laterality: N/A;   COLONOSCOPY WITH PROPOFOL N/A 03/08/2019   Procedure: COLONOSCOPY WITH PROPOFOL;  Surgeon: Lin Landsman, MD;  Location: [facility] ENDOSCOPY;  Service: Gastroenterology;  Laterality: N/A;   COLONOSCOPY WITH PROPOFOL N/A 07/16/2020   Procedure: COLONOSCOPY WITH PROPOFOL;  Surgeon: Lin Landsman, MD;  Location[facility] ENDOSCOPY;  Service: Gastroenterology;  Laterality: N/A;   ESOPHAGOGASTRODUODENOSCOPY  2015   ESOPHAGOGASTRODUODENOSCOPY (EGD) WITH PROPOFOL N/A 06/08/2017   Procedure: ESOPHAGOGASTRODUODENOSCOPY (EGD) WITH PROPOFOL;  Surgeon: Lin Landsman, MD;  Location: [place];  Service: Gastroenterology;  Laterality: N/A;   ESOPHAGOGASTRODUODENOSCOPY (EGD) WITH PROPOFOL N/A 03/08/2019   Procedure: ESOPHAGOGASTRODUODENOSCOPY (EGD) WITH PROPOFOL;  Surgeon: Lin Landsman, MD;  Location: [facility] ENDOSCOPY;  Service: Gastroenterology;  Laterality: N/A;   FINGER SURGERY     Right small finger   FRACTURE SURGERY     GIVENS CAPSULE STUDY  2015   TUBAL LIGATION      Prior to Admission medications   Medication Sig Start Date End Date Taking? Authorizing Provider  albuterol (VENTOLIN HFA) 108 (90 Base) MCG/ACT inhaler Inhale 2 puffs into the lungs every 4 (four) hours as needed for wheezing or shortness of breath. 07/01/21  Yes Arta Silence, MD  apixaban (ELIQUIS) 5 MG TABS tablet 1 tablet (81m) twice daily 05/22/21   GMinna Merritts MD  apixaban (ELIQUIS) 5 MG TABS tablet Take 1 tablet (5 mg total) by mouth 2 (two) times daily. Patient not taking: Reported on 06/17/2021 05/22/21   GMinna Merritts MD  latanoprost (XALATAN) 0.005 % ophthalmic solution Place 1 drop into both eyes at bedtime.  04/22/16   [provider]  levothyroxine (SYNTHROID) 50 MCG tablet Take 1 tablet (50 mcg total) by mouth daily before breakfast. On empty stomach 01/30/21   TDelsa Grana PA-C  losartan-hydrochlorothiazide (HYZAAR) 50-12.5 MG tablet Take 0.5 tablets by mouth daily. D/c HCTZ 01/30/21   TDelsa Grana PA-C  Multiple Vitamin (MULTIVITAMIN) tablet Take 1 tablet by mouth daily.    [provider]  omeprazole (PRILOSEC) 20 MG capsule Take 1 capsule (20 mg total) by mouth daily. Patient not taking: Reported on 05/22/2021 01/22/21 04/22/21  VLin Landsman MD  pregabalin (LYRICA) 25 MG capsule Take 25 mg by mouth 3 (three) times daily. 04/20/21 04/20/22  [provider]  rosuvastatin (CRESTOR) 20 MG tablet Take 1 tablet (20 mg total) by mouth at bedtime. 01/30/21   TDelsa Grana PA-C  sertraline (ZOLOFT) 25 MG tablet Take 0.5 tablets (12.5 mg total) by mouth daily. 09/03/20   TDelsa Grana PA-C  triamcinolone ointment (KENALOG)  0.1 % Apply to affected skin one to two times daily for up to two weeks 11/05/20   Delsa Grana, PA-C  valACYclovir (VALTREX) 1000 MG tablet Take 1 tablet (1,000 mg total) by mouth 2 (two) times daily. 03/28/18   Poulose, Bethel Born, NP  vedolizumab (ENTYVIO) 300 MG injection Infuse every 8 weeks. Patient is okay to restart the [facility] 06/18/21   Lin Landsman, MD    Allergies Patient has no known allergies.  Family History  Problem Relation Age of Onset   Heart disease Mother    Heart attack Mother    Breast cancer Sister    Alcohol abuse Father    Cerebrovascular Accident Father    Arthritis Father    Hypertension Father    Hypertension Other    Diabetes Other    Breast cancer Sister 59   Heart attack Other 17   Cancer Sister     Social History Social History   Tobacco Use   Smoking status: Never   Smokeless tobacco: Never  Vaping Use   Vaping Use: Never used  Substance Use Topics   Alcohol use: No    Alcohol/week: 0.0 standard drinks   Drug use: No     Review of Systems  Constitutional: No fever/chills Eyes: No visual changes. ENT: No sore throat. Cardiovascular: Positive for chest discomfort. Respiratory: Positive for shortness of breath. Gastrointestinal: No vomiting or diarrhea.  Genitourinary: Negative for dysuria.  Musculoskeletal: Negative for back pain.  Positive for chronic left leg pain. Skin: Negative for rash. Neurological: Negative for headaches, focal weakness or numbness.   ____________________________________________   PHYSICAL EXAM:  VITAL SIGNS: ED Triage Vitals  Enc Vitals Group     BP 07/01/21 1101 (!) 187/77     Pulse Rate 07/01/21 1149 87     Resp 07/01/21 1101 18     Temp 07/01/21 1101 98.5 F (36.9 C)     Temp Source 07/01/21 1101 Oral     SpO2 07/01/21 1101 100 %     Weight 07/01/21 1102 143 lb (64.9 kg)     Height 07/01/21 1102 5' (1.524 m)     Head Circumference --      Peak Flow --      Pain Score 07/01/21 1102 3     Pain Loc --      Pain Edu? --      Excl. in [place]? --     Constitutional: Alert and oriented.  Relatively well appearing and in no acute distress. Eyes: Conjunctivae are normal.  Head: Atraumatic. Nose: No congestion/rhinnorhea. Mouth/Throat: Mucous membranes are moist.   Neck: Normal range of motion.  Cardiovascular: Normal rate, regular rhythm. Grossly normal heart sounds.  Good peripheral circulation. Respiratory: Normal respiratory effort.  No retractions. Lungs CTAB. Gastrointestinal: No distention.  Musculoskeletal: No lower extremity edema.  Extremities warm and well perfused.  Full range of motion at left hip and knee.  No calf or popliteal swelling or tenderness. Neurologic:  Normal speech and language. No gross focal neurologic deficits are appreciated.  Skin:  Skin is warm and dry. No rash noted. Psychiatric: Mood and affect are normal. Speech and behavior are normal.  ____________________________________________   LABS (all labs ordered are listed, but  only abnormal results are displayed)  Labs Reviewed  BASIC METABOLIC PANEL - Abnormal; Notable for the following components:      Result Value   Glucose, Bld 107 (*)    Calcium 10.8 (*)    All other components  within normal limits  CBC  TROPONIN I (HIGH SENSITIVITY)   ____________________________________________  EKG  ED ECG REPORT I, Arta Silence, the attending physician, personally viewed and interpreted this ECG.  Date: 07/01/2021 EKG Time: 1103 Rate: 76 Rhythm: normal sinus rhythm QRS Axis: normal Intervals: normal ST/T Wave abnormalities: Nonspecific T wave abnormality in V2 Narrative Interpretation: Nonspecific abnormality with no evidence of acute ischemia; no significant change when compared to EKG of 05/22/2021 and 05/05/2021  ____________________________________________  RADIOLOGY  Chest x-ray interpreted by me shows no focal consolidation or edema  ____________________________________________   PROCEDURES  Procedure(s) performed: No  Procedures  Critical Care performed: No ____________________________________________   INITIAL IMPRESSION / ASSESSMENT AND PLAN / ED COURSE  Pertinent labs & imaging results that were available during my care of the patient were reviewed by me and considered in my medical decision making (see chart for details).   65 year old female with PMH as noted above including asthma, COPD, and recently diagnosed PE on Eliquis presents with chronic chest tightness and shortness of breath which have been ongoing for several months.  I reviewed the past medical records in epic and care everywhere.  The patient was seen in the ED here on 8/16 and at that time diagnosed with multiple small PEs in the left lung.  She was stable and was started on Eliquis, which she states she is taking.  She was subsequently seen at the [facility] ED on 9/23 and had a PE study which was negative, and troponins which were minimally indeterminate but with no  change.  The patient states that she did not complete her evaluation there because of the long wait time.  On exam today, the patient is overall well-appearing.  She is hypertensive with otherwise normal vital signs.  Her O2 saturation is in the high 90s on room air.  The patient asked to be unhooked from the monitor and I walked with her to the bathroom.  She was able to walk on her own without difficulty and did not demonstrate any acute shortness of breath or respiratory distress at that time.  Lungs are clear to auscultation.  There is no peripheral edema.  Exam is otherwise unremarkable.  Basic labs were obtained from triage along with an initial troponin which was negative.  Chest x-ray is clear.  EKG shows a nonspecific T wave abnormality in V2, with overall morphology appearing similar to prior EKGs and no significant ST changes  Differential includes COPD, chronic post-COVID syndrome, symptoms related to her known PE, or less likely ACS.  There is no evidence of CHF.  I do not suspect acute PE given that the patient has had unchanged symptoms since her last negative PE study on 9/23.  I had an extensive discussion with the patient about her symptoms and the work-up so far.  The patient insists that her symptoms are chronic for months and are unchanged today.  She states that the episode of shortness of breath she had at the doctor's office was identical to what she has been having intermittently the last several months.  She also denies any acute change with the leg.  The patient overall appears frustrated that doctors cannot seem to find out what is causing her symptoms.  I recommended that since her EKG is not totally normal, we check a second troponin to again rule out ACS, as well as potentially obtaining an x-ray of the left hip and a DVT study of the left leg, although my suspicion for DVT  is low.  It does not appear that she has had the study previously.  However, the patient declines  these further studies, states she does not want to be stuck again, and that she just wants to go home.  I specifically told her that without doing a repeat troponin I cannot fully rule out that she could be having strain on the heart or heart attack, and this could cause permanent damage to the heart, permanent disability, and death.  The patient was able to paraphrase these risks back to me and understood them; she demonstrates appropriate decision-making capacity to decline further care at this time.   The patient again states that she has no new symptoms today and nothing has changed, and that she would like to go home.  I instructed her to follow-up with her PMD, and she agrees.  I recommended that we do a trial of albuterol in case her symptoms are related to COPD/bronchitis and she agreed.  I gave her very thorough return precautions and she expressed understanding.  ____________________________________________   FINAL CLINICAL IMPRESSION(S) / ED DIAGNOSES  Final diagnoses:  Atypical chest pain  Shortness of breath  Left leg pain      NEW MEDICATIONS STARTED DURING THIS VISIT:  Discharge Medication List as of 07/01/2021  2:10 PM     START taking these medications   Details  albuterol (VENTOLIN HFA) 108 (90 Base) MCG/ACT inhaler Inhale 2 puffs into the lungs every 4 (four) hours as needed for wheezing or shortness of breath., Starting Wed 07/01/2021, Normal         Note:  This document was prepared using Dragon voice recognition software and may include unintentional dictation errors.    Arta Silence, MD 07/01/21 1630

## 2021-07-01 NOTE — Discharge Instructions (Addendum)
Follow-up with your primary care doctor to discuss your chronic symptoms that have been occurring over the last several months and decide if any other tests are needed as an outpatient.  You may try taking the albuterol inhaler up to every 4 hours when you develop the shortness of breath or chest tightness.  Return to the ER immediately for new, worsening, or persistent severe chest pain or discomfort, difficulty breathing, weakness or lightheadedness, fever, new leg pain or swelling, difficulty walking, or any other new or worsening symptoms that concern you.

## 2021-07-01 NOTE — Therapy (Signed)
[facility] [address], [place], [address] Phone: [phone]   Fax:  [phone]  Physical Therapy Treatment  Patient Details  Name: Carolyn Campbell MRN: [id number] Date of Birth: [birth year] Referring Provider (PT): Girtha Hake, MD   Encounter Date: 07/01/2021   PT End of Session - 07/01/21 1703     Visit Number 4    Number of Visits 24    Date for PT Re-Evaluation 09/09/21    Authorization Type [employer] MEDICARE reporting period from 06/17/2021    Progress Note Due on Visit 10    PT Start Time 1640    PT Stop Time 1725    PT Time Calculation (min) 45 min    Activity Tolerance Patient tolerated treatment well    Behavior During Therapy [facility] for tasks assessed/performed             Past Medical History:  Diagnosis Date   Abdominal pain, epigastric    Allergic rhinitis    Anginal pain ([place])    states MD said it was GERD   Arthritis    Asthma    Breast cancer ([place]) 2006   LT LUMPECTOMY   CD (Crohn's disease) ([place]) 05/21/2015   FOLLOWED BY GI    Clotting disorder ([place])    Cognitive deficits as late effect of cerebrovascular disease    COPD (chronic obstructive pulmonary disease) ([place])    Crohn's disease of both small and large intestine with rectal bleeding ([place]) 12/04/2014   Depression    Currently taking zoloft.   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    Dyspnea    Esophageal reflux    Fever blister 07/19/2018   Glaucoma    vitreous degeneration   History of kidney stones    Hyperlipidemia    Hypertension    Hypothyroidism    Occlusion, cerebral artery    NOS w/infarction   Osteoporosis    Personal history of radiation therapy 2006   BREAST CA   Stroke [facility])    Ulcer     Past Surgical History:  Procedure Laterality Date   BREAST BIOPSY Left 2006   POS   BREAST LUMPECTOMY Left 09/20/2004   positive   BREAST SURGERY Left     malignant biopsy   CARDIAC CATHETERIZATION     COLONOSCOPY  2015   COLONOSCOPY WITH PROPOFOL N/A 06/08/2017   Procedure: COLONOSCOPY WITH PROPOFOL;  Surgeon: Lin Landsman, MD;  Location: [facility];  Service: Gastroenterology;  Laterality: N/A;   COLONOSCOPY WITH PROPOFOL N/A 03/08/2019   Procedure: COLONOSCOPY WITH PROPOFOL;  Surgeon: Lin Landsman, MD;  Location: [facility] ENDOSCOPY;  Service: Gastroenterology;  Laterality: N/A;   COLONOSCOPY WITH PROPOFOL N/A 07/16/2020   Procedure: COLONOSCOPY WITH PROPOFOL;  Surgeon: Lin Landsman, MD;  Location: [facility] ENDOSCOPY;  Service: Gastroenterology;  Laterality: N/A;   ESOPHAGOGASTRODUODENOSCOPY  2015   ESOPHAGOGASTRODUODENOSCOPY (EGD) WITH PROPOFOL N/A 06/08/2017   Procedure: ESOPHAGOGASTRODUODENOSCOPY (EGD) WITH PROPOFOL;  Surgeon: Lin Landsman, MD;  Location: [facility];  Service: Gastroenterology;  Laterality: N/A;   ESOPHAGOGASTRODUODENOSCOPY (EGD) WITH PROPOFOL N/A 03/08/2019   Procedure: ESOPHAGOGASTRODUODENOSCOPY (EGD) WITH PROPOFOL;  Surgeon: Lin Landsman, MD;  Location: [facility] ENDOSCOPY;  Service: Gastroenterology;  Laterality: N/A;   FINGER SURGERY     Right small finger   FRACTURE SURGERY     GIVENS CAPSULE STUDY  2015   TUBAL LIGATION  Vitals:   07/01/21 1643  BP: (!) 168/60  Pulse: 84  SpO2: 96%     Subjective Assessment - 07/01/21 1643     Subjective Patient arrives with [facility] and states it has been "a day" after multiple Dr. visits and a visit to the ED today due to shortness of breath at the reccomendation neurology office when she had symptoms at their office. Dr. Cherylann Banas at the ED wanted to repeat troponin to rule out heart attack/ACS and suggested L hip xray and  DVT study for left leg pain but patient declined, stating her sympotms have been chronic and that doctors keep doing tests and different doctors are unable to see the results. Dr. Cherylann Banas did mention that he had low  index of suspicion for DVT and initial troponin was not elevated. Patient states neurology suggested an MRI of her left hip. She had originally gone to neurologist to discuss testing for gene that could increase risk for [place]'s. Patient reports she has not been able to do her HEP today but otherwise has been doing them. She feels like the exercises help. She felt okay after she left PT last session. She feels that coming to PT provides the best releif she has had. BP was high at the neurologist's office and in the ED today. Reports current pain 4/10 in the whole left lower leg. Patient states she has no symptoms except L LE pain and shorness of breath with exertion.    Pertinent History Patient is a 65 y.o. 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 This patient's chief complaints consist of left buttock pain with radiation into L LE to foot and up to low back leading to the following functional deficits: difficulty with usual activities such as walking, sitting, changing positions, bed mobility, household and community mobility, stairs, lifting, moving L LE, house cleaning, shopping cart, sleeping, going to school, exercising. .  Relevant past medical history and comorbidities include COVID19 with 2 PE found in August 2022 (on eliquis), shortness of breath, history of CVA (2008, affects left side mostly LE), HTN, osteoporosis, Crohn's disease (currently gets infusions), osteopenia, glaucoma, cataract removal, hx of depression (weaning medications with MD supervision), breast cancer (2006), clotting disorder, hypothyroidism, cardiac catheterization. Patient denies hx of seizures, lung problem, diabetes, unexplained weight loss, changes in bowel or bladder problems, new onset stumbling or dropping things  apart from history (L leg feels like it gives out more since leg pain), history of spinal surgery.    Limitations Lifting;Standing;Walking;House hold activities   walking, sitting, changing positions, bed mobility, household and community mobility, stairs, lifting, moving L LE, house cleaning, shopping cart, sleeping, going to school, exercising.   Diagnostic tests Lumbar MRI report 11/02/2020: "IMPRESSION:  Lumbar spondylosis as outlined.  At L4-L5, there is mild/moderate disc degeneration. Grade 1  anterolisthesis. Posterior annular fissure. Disc uncovering with  disc bulge. Moderate facet arthrosis with mild ligamentum flavum  hypertrophy. Trace right facet joint effusion. Bilateral neural  foraminal narrowing (moderate right, mild left).  No more than mild spinal canal or neural foraminal narrowing at the  remaining levels.  Edema and enhancement within the right L4 and L5 pedicles/articular  pillars, which may be degenerative or may reflect stress reaction.  Degenerative marrow edema and enhancement along the right L4-L5  facet joint."  Chest CT report 05/05/2021: "IMPRESSION:  Small filling defects are noted in upper and lower lobe branches of  the left pulmonary artery consistent with acute pulmonary emboli.  Critical Value/emergent results were called by telephone at the time  of interpretation on 05/05/2021 at 3:08 pm to provider Lavonia Drafts  , who verbally acknowledged these results.  Aortic Atherosclerosis (ICD10-I70.0)."    Patient Stated Goals want to be able to move around without hurting so much    Currently in Pain? Yes    Pain Score 4     Pain Onset More than a month ago            OBJECTIVE  VITALS BP 168/60 mmHg at left arm, sitting, adult large cuff, manual, steady heart beat per ascultation.  HR 84 bpm SpO2 96 % Noted to have rapid respiration upon arrival with walking.   LEFT LOWER LEG EXAM Palpation: texture similar to R LE, reports pain at left posterolateral calf. No  increased heat to touch in left compared to right or compared to expected temperature.  Observation: no significant redness Calf circumference: R = 38 cm, L = 38 cm  TREATMENT:   denies sensitivity to latex   Manual therapy: to reduce pain and tissue tension, improve range of motion, neuromodulation, in order to promote improved ability to complete functional activities. SUPINE  - LAD through left hip, 3x30 seconds (decreased pain) PRONE with head in table face cradle and pillow under ankles - STM to bilateral lumbar paraspinals L > R, left glute max/med/min, left deep external hip rotators. (Provokes leg paresthesia with STM to upper glute max and left lumbar paraspinals).  - CPT and left UPA grade III-IV, L1-L5, (most tender at lowest levels, provokes L LE pain near L3-L5)    Therapeutic exercise: to centralize symptoms and improve ROM, strength, muscular endurance, and activity tolerance required for successful completion of functional activities.   - examination to maximize patient safety (see above).  - left side glide at wall (R side toward wall), 3x20 (walking in between demo improvement).  (Manual therapy - see above).  - supine sciatic nerve glide, 1x15 left side   Pt required multimodal cuing for proper technique and to facilitate improved neuromuscular control, strength, range of motion, and functional ability resulting in improved performance and form.     HOME EXERCISE PROGRAM Access Code: [email or web address] URL: [email or web address] Date: 06/23/2021 Prepared by: Rosita Kea   Exercises Right Standing Lateral Shift Correction at Wall - Repetitions - 3 x daily - 20 reps - or when pain is present - 1 sets Supine 90/90 Sciatic Nerve Glide with Knee Flexion/Extension - 1-2 x daily - 15 reps Bridge - 1 x daily - 1 sets - 20 reps - 5 seconds hold Hooklying Clamshell with Resistance - 1 x daily - 1 sets - 20 reps - 5 seconds hold     PT Education - 07/01/21 1703      Education Details management techniques Exercise purpose/form. Self management techniques    Person(s) Educated Patient    Methods Explanation;Demonstration;Tactile cues;Verbal cues    Comprehension Verbalized understanding;Returned demonstration;Verbal cues required;Tactile cues required;Need further instruction              PT Short Term Goals - 06/17/21 1507       PT SHORT TERM GOAL #1   Title Be independent with initial home exercise program for self-management of symptoms.    Baseline Initial HEP provided at IE (06/17/2021);    Time 2  Period Weeks    Status New    Target Date 07/01/21               PT Long Term Goals - 06/17/21 1507       PT LONG TERM GOAL #1   Title Be independent with a long-term home exercise program for self-management of symptoms.    Baseline Initial HEP provided at IE (06/17/2021);    Time 12    Period Weeks    Status New   TARGET DATE FOR ALL LONG TERM GOALS: 09/09/2021     PT LONG TERM GOAL #2   Title Demonstrate improved FOTO score to equal or greater than 50 by visit #12 to demonstrate improvement in overall condition and self-reported functional ability.    Baseline 40 (06/17/2021);    Time 12    Period Weeks    Status New      PT LONG TERM GOAL #3   Title Have full lumbar AROM with no compensations or increase in pain in all planes except intermittent end range discomfort to allow patient to complete valued activities with less difficulty.    Baseline painful and limited - see objective exam (06/17/2021);    Time 12    Period Weeks    Status New      PT LONG TERM GOAL #4   Title Reduce pain with functional activities to equal or less than 2/10 to allow patient to complete usual activities including ADLs, IADLs, and social engagement with less difficulty.    Baseline up to 10/10 (06/17/2021);    Time 12    Period Weeks    Status New      PT LONG TERM GOAL #5   Title Complete community, work and/or recreational activities  without limitation due to current condition.    Baseline Functional Limitations: walking, sitting, changing positions, bed mobility, household and community mobility, stairs, lifting, moving L LE, house cleaning, shopping cart, sleeping, going to school, exercising.    Time 12    Period Weeks    Status New      Additional Long Term Goals   Additional Long Term Goals Yes      PT LONG TERM GOAL #6   Title Patient will ambulate equal or greater than 1000 feet with no AD on 6 Minute Walk Test to demonstrate improved household and community mobility.    Baseline unable to tolerate test - difficulty walking ~ 50 feet from/to waiting room with [facility] (06/17/2021);    Time 12    Period Weeks    Status New                   Plan - 07/01/21 1945     Clinical Impression Statement Patient arrives to PT with usual L LE and low back symptoms after being discharged from ED for shortness of breath. Patient demonstrated some shortness of breath upon arrival that is consistent with her prior descriptions and per PT observation at prior PT sessions. Vitals were checked to maximize safety and were found to be within functional limits. Examination of L lower leg performed and found not to be consistent with definite DVT. Symptoms similar to what patient has been attending PT for at this episode of care and past episode of care decreased likelihood of DVT, so proceeded with PT today. Patient reported concordant paresthesia in left lower leg with CPA to lowest lumbar segments and STM to R lumbar paraspinals and reported decreased pain/symptoms by end of  session. Time for usual exercises limited today due to need for additional screening to maximize safety but patient would benefit from return to full amount of exercises next session. Patient would benefit from continued management of limiting condition by skilled physical therapist to address remaining impairments and functional limitations to work towards stated  goals and return to PLOF or maximal functional independence.    Personal Factors and Comorbidities Age;Past/Current Experience;Comorbidity 3+;Fitness;Time since onset of injury/illness/exacerbation    Comorbidities Relevant past medical history and comorbidities include COVID19 with 2 PE found in August 2022 (on eliquis), shortness of breath, history of CVA (2008, affects left side mostly LE), HTN, osteoporosis, Crohn's disease (currently gets infusions), osteopenia, glaucoma, cataract removal, hx of depression (weaning medications with MD supervision), breast cancer (2006), clotting disorder, hypothyroidism, cardiac catheterization.    Examination-Activity Limitations Bed Mobility;Bathing;Hygiene/Grooming;Squat;Stairs;Lift;Bend;Locomotion Level;Stand;Caring for Others;Carry;Transfers;Sit;Dressing;Sleep    Examination-Participation Restrictions Laundry;Cleaning;Shop;Community Activity;Meal Prep;Yard Work;Interpersonal Relationship   walking, sitting, changing positions, bed mobility, household and community mobility, stairs, lifting, moving L LE, house cleaning, shopping cart, sleeping, going to school, exercising.   Stability/Clinical Decision Making Stable/Uncomplicated    Rehab Potential Good    PT Frequency 2x / week    PT Duration 12 weeks    PT Treatment/Interventions ADLs/Self Care Home Management;Aquatic Therapy;Biofeedback;Cryotherapy;Moist Heat;Traction;Electrical Stimulation;DME Instruction;Gait training;Stair training;Functional mobility training;Neuromuscular re-education;Balance training;Therapeutic exercise;Therapeutic activities;Patient/family education;Dry needling;Passive range of motion;Manual techniques;Spinal Manipulations    PT Next Visit Plan update HEP as appropriate, manual therapy as needed, trunk and LE strenghtening and stretching as tolerated,    PT Home Exercise Plan [facility] Access Code: [email or web address]    Consulted and Agree with Plan of Care Patient             Patient  will benefit from skilled therapeutic intervention in order to improve the following deficits and impairments:  Abnormal gait, Decreased knowledge of use of DME, Impaired sensation, Improper body mechanics, Pain, Cardiopulmonary status limiting activity, Decreased mobility, Postural dysfunction, Increased muscle spasms, Decreased activity tolerance, Decreased endurance, Decreased range of motion, Decreased strength, Hypomobility, Impaired perceived functional ability, Decreased balance, Difficulty walking, Impaired flexibility  Visit Diagnosis: Chronic left-sided low back pain with left-sided sciatica  Difficulty in walking, not elsewhere classified  Muscle weakness (generalized)  Unsteadiness on feet     Problem List Patient Active Problem List   Diagnosis Date Noted   Low back pain radiating to left leg 06/17/2021   Glaucoma 02/23/2021   Other long term (current) drug therapy 01/22/2021   Varicose veins of both lower extremities with pain 07/19/2018   Arthritis of right hand 12/21/2017   Immunosuppressed status ([place]) 12/21/2017   Major depression in remission ([place]) 12/21/2017   Fibrocystic breast changes 06/22/2017   Osteopenia 10/18/2016   Atherosclerosis of aorta ([place]) 12/19/2015   Vitreous degeneration 05/21/2015   Glaucoma associated with chamber angle anomalies 05/21/2015   Crohn's disease ([place]) 05/21/2015   H/O malignant neoplasm of breast 05/21/2015   Crohn's disease of both small and large intestine with rectal bleeding ([place]) 12/04/2014   Solitary pulmonary nodule 11/07/2012   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 Graylon Good, PT, DPT 07/01/21, 7:46 PM   [facility] [address] [place], [address]  Phone: [phone]   Fax:  [phone]  Name: ZUNAIRA LAMY MRN: [id number] Date of Birth: 03-28-1956

## 2021-07-01 NOTE — ED Triage Notes (Signed)
Pt states that she was walking from a dr's appt and started having chest pain, states that she has been having this for awhile and has been seen by a cardiologist in the past for chest pain, pt states that when she walks up stairs or distances she starts having chest pain

## 2021-07-02 ENCOUNTER — Encounter: Payer: Medicare Other | Admitting: Physical Therapy

## 2021-07-06 ENCOUNTER — Ambulatory Visit: Payer: Medicare Other | Admitting: Physical Therapy

## 2021-07-06 ENCOUNTER — Encounter: Payer: Self-pay | Admitting: Physical Therapy

## 2021-07-06 DIAGNOSIS — R262 Difficulty in walking, not elsewhere classified: Secondary | ICD-10-CM

## 2021-07-06 DIAGNOSIS — M6281 Muscle weakness (generalized): Secondary | ICD-10-CM

## 2021-07-06 DIAGNOSIS — G8929 Other chronic pain: Secondary | ICD-10-CM

## 2021-07-06 DIAGNOSIS — R2681 Unsteadiness on feet: Secondary | ICD-10-CM

## 2021-07-06 DIAGNOSIS — M5442 Lumbago with sciatica, left side: Secondary | ICD-10-CM | POA: Diagnosis not present

## 2021-07-06 NOTE — Therapy (Signed)
[facility] [address], [place], [address] Phone: [phone]   Fax:  [phone]  Physical Therapy Treatment  Patient Details  Name: Carolyn Campbell MRN: [id number] Date of Birth: Sep 14, 1956 Referring Provider (PT): Girtha Hake, MD   Encounter Date: 07/06/2021   PT End of Session - 07/06/21 0911     Visit Number 5    Number of Visits 24    Date for PT Re-Evaluation 09/09/21    Authorization Type [employer] MEDICARE reporting period from 06/17/2021    Progress Note Due on Visit 10    PT Start Time 0900    PT Stop Time 0940    PT Time Calculation (min) 40 min    Activity Tolerance Patient tolerated treatment well    Behavior During Therapy [facility] for tasks assessed/performed             Past Medical History:  Diagnosis Date   Abdominal pain, epigastric    Allergic rhinitis    Anginal pain ([place])    states MD said it was GERD   Arthritis    Asthma    Breast cancer ([place]) 2006   LT LUMPECTOMY   CD (Crohn's disease) ([place]) 05/21/2015   FOLLOWED BY GI    Clotting disorder ([place])    Cognitive deficits as late effect of cerebrovascular disease    COPD (chronic obstructive pulmonary disease) ([place])    Crohn's disease of both small and large intestine with rectal bleeding ([place]) 12/04/2014   Depression    Currently taking zoloft.   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    Dyspnea    Esophageal reflux    Fever blister 07/19/2018   Glaucoma    vitreous degeneration   History of kidney stones    Hyperlipidemia    Hypertension    Hypothyroidism    Occlusion, cerebral artery    NOS w/infarction   Osteoporosis    Personal history of radiation therapy 2006   BREAST CA   Stroke [facility])    Ulcer     Past Surgical History:  Procedure Laterality Date   BREAST BIOPSY Left 2006   POS   BREAST LUMPECTOMY Left 09/20/2004   positive   BREAST SURGERY Left     malignant biopsy   CARDIAC CATHETERIZATION     COLONOSCOPY  2015   COLONOSCOPY WITH PROPOFOL N/A 06/08/2017   Procedure: COLONOSCOPY WITH PROPOFOL;  Surgeon: Lin Landsman, MD;  Location: [facility];  Service: Gastroenterology;  Laterality: N/A;   COLONOSCOPY WITH PROPOFOL N/A 03/08/2019   Procedure: COLONOSCOPY WITH PROPOFOL;  Surgeon: Lin Landsman, MD;  Location: [facility] ENDOSCOPY;  Service: Gastroenterology;  Laterality: N/A;   COLONOSCOPY WITH PROPOFOL N/A 07/16/2020   Procedure: COLONOSCOPY WITH PROPOFOL;  Surgeon: Lin Landsman, MD;  Location: [facility] ENDOSCOPY;  Service: Gastroenterology;  Laterality: N/A;   ESOPHAGOGASTRODUODENOSCOPY  2015   ESOPHAGOGASTRODUODENOSCOPY (EGD) WITH PROPOFOL N/A 06/08/2017   Procedure: ESOPHAGOGASTRODUODENOSCOPY (EGD) WITH PROPOFOL;  Surgeon: Lin Landsman, MD;  Location: [facility];  Service: Gastroenterology;  Laterality: N/A;   ESOPHAGOGASTRODUODENOSCOPY (EGD) WITH PROPOFOL N/A 03/08/2019   Procedure: ESOPHAGOGASTRODUODENOSCOPY (EGD) WITH PROPOFOL;  Surgeon: Lin Landsman, MD;  Location: [facility] ENDOSCOPY;  Service: Gastroenterology;  Laterality: N/A;   FINGER SURGERY     Right small finger   FRACTURE SURGERY     GIVENS CAPSULE STUDY  2015   TUBAL LIGATION  There were no vitals filed for this visit.   Subjective Assessment - 07/06/21 0901     Subjective [name] reports she is feeling okay today. States she felt good after last PT session. Reports current pain is 2/10 in left lateral thigh and a little in the lateral calf. She states she has not had any worse trouble with breathing since last PT session. She feels she has accepted that she is just going to have some trouble with breathing since she has done as much as she can. The only reason she went to ED last week is because her doctor asked her to. States she has increased pain sometimes and other times feels much better. Thinks prolonged sititng or standing  may increase her leg pain.    Pertinent History Patient is a 65 y.o. 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 This patient's chief complaints consist of left buttock pain with radiation into L LE to foot and up to low back leading to the following functional deficits: difficulty with usual activities such as walking, sitting, changing positions, bed mobility, household and community mobility, stairs, lifting, moving L LE, house cleaning, shopping cart, sleeping, going to school, exercising. .  Relevant past medical history and comorbidities include COVID19 with 2 PE found in August 2022 (on eliquis), shortness of breath, history of CVA (2008, affects left side mostly LE), HTN, osteoporosis, Crohn's disease (currently gets infusions), osteopenia, glaucoma, cataract removal, hx of depression (weaning medications with MD supervision), breast cancer (2006), clotting disorder, hypothyroidism, cardiac catheterization. Patient denies hx of seizures, lung problem, diabetes, unexplained weight loss, changes in bowel or bladder problems, new onset stumbling or dropping things apart from history (L leg feels like it gives out more since leg pain), history of spinal surgery.    Limitations Lifting;Standing;Walking;House hold activities   walking, sitting, changing positions, bed mobility, household and community mobility, stairs, lifting, moving L LE, house cleaning, shopping cart, sleeping, going to school, exercising.   Diagnostic tests Lumbar MRI report 11/02/2020: "IMPRESSION:  Lumbar spondylosis as outlined.  At L4-L5, there is mild/moderate disc degeneration. Grade 1  anterolisthesis. Posterior annular fissure. Disc uncovering with  disc bulge. Moderate facet arthrosis with mild ligamentum flavum  hypertrophy.  Trace right facet joint effusion. Bilateral neural  foraminal narrowing (moderate right, mild left).  No more than mild spinal canal or neural foraminal narrowing at the  remaining levels.  Edema and enhancement within the right L4 and L5 pedicles/articular  pillars, which may be degenerative or may reflect stress reaction.  Degenerative marrow edema and enhancement along the right L4-L5  facet joint."    Chest CT report 05/05/2021: "IMPRESSION:  Small filling defects are noted in upper and lower lobe branches of  the left pulmonary artery consistent with acute pulmonary emboli.  Critical Value/emergent results were called by telephone at the time  of interpretation on 05/05/2021 at 3:08 pm to provider Lavonia Drafts  , who verbally acknowledged these results.  Aortic Atherosclerosis (ICD10-I70.0)."    Patient Stated Goals want to be able to move around without hurting so much    Currently in Pain? Yes    Pain Score 2     Pain Location Leg    Pain Onset More than a month ago  OBJECTIVE  SELF-REPORTED FUNCTION FOTO score: 39/100 (lumbar spine questionnaire)   TREATMENT:   denies sensitivity to latex   Manual therapy: to reduce pain and tissue tension, improve range of motion, neuromodulation, in order to promote improved ability to complete functional activities. SUPINE/SIDELYING  - LAD through left hip, 4x30 seconds PRONE with head in table face cradle and pillow under ankles - STM to L lumbar paraspinals, L glute max/med/min, left deep external hip rotators.   Modality: (unbilled) Dry needling performed to left glute region to decrease pain and spasms along patient's left superior glute max and glute min/med region with patient in sidelying utilizing (1) dry needle(s) .53m x 775mwith (1) sticks at superior glute max and (1) stick at glute med/min. Patient educated about the risks and benefits from therapy and verbally consents to treatment.  Dry needling performed by SaEverlean Alstrom SnGraylon GoodT, DPT who is certified in this technique.   Therapeutic exercise: to centralize symptoms and improve ROM, strength, muscular endurance, and activity tolerance required for successful completion of functional activities.   - left side glide at wall (R side toward wall), 3x20 (walking in between demo improvement).  (Manual therapy/dry needling - see above).  - supine sciatic nerve glide, 1x15 left side - prone press up 2x10 (Walk afterwards states she feels better).  - Quadruped bird dog (alternating shoulder flexion/contralateral hip extension with core muscles braced) , with cone over sacrum for biofeedback to help keep core stabilized. Cuing for abdominal brace. 2x10 each side. 2nd set completed circle with hand and foot.   - bridge with hip abduction against RTB, 5 second hold, 1x20 - Education on HEP including handout    Pt required multimodal cuing for proper technique and to facilitate improved neuromuscular control, strength, range of motion, and functional ability resulting in improved performance and form.     HOME EXERCISE PROGRAM Access Code: [email or web address] URL: [email or web address] Date: 07/06/2021 Prepared by: SaRosita KeaExercises Seated Posture with Lumbar Roll Right Standing Lateral Shift Correction at Wall - Repetitions - 3 x daily - 1 sets - 20 reps - or when pain is present Supine 90/90 Sciatic Nerve Glide with Knee Flexion/Extension - 1-2 x daily - 15 reps Bridge - 1 x daily - 1 sets - 20 reps - 5 seconds hold Hooklying Clamshell with Resistance - 1 x daily - 1 sets - 20 reps - 5 seconds hold     PT Education - 07/06/21 0911     Education Details Exercise purpose/form. Self management techniques    Person(s) Educated Patient    Methods Explanation;Demonstration;Tactile cues;Verbal cues;Handout    Comprehension Verbalized understanding;Returned demonstration;Verbal cues required;Tactile cues required;Need further instruction               PT Short Term Goals - 07/06/21 0944       PT SHORT TERM GOAL #1   Title Be independent with initial home exercise program for self-management of symptoms.    Baseline Initial HEP provided at IE (06/17/2021);    Time 2    Period Weeks    Status Achieved    Target Date 07/01/21               PT Long Term Goals - 06/17/21 1507       PT LONG TERM GOAL #1   Title Be independent with a long-term home exercise program for self-management of symptoms.    Baseline Initial HEP provided at IE (06/17/2021);    Time 12  Period Weeks    Status New   TARGET DATE FOR ALL LONG TERM GOALS: 09/09/2021     PT LONG TERM GOAL #2   Title Demonstrate improved FOTO score to equal or greater than 50 by visit #12 to demonstrate improvement in overall condition and self-reported functional ability.    Baseline 40 (06/17/2021);    Time 12    Period Weeks    Status New      PT LONG TERM GOAL #3   Title Have full lumbar AROM with no compensations or increase in pain in all planes except intermittent end range discomfort to allow patient to complete valued activities with less difficulty.    Baseline painful and limited - see objective exam (06/17/2021);    Time 12    Period Weeks    Status New      PT LONG TERM GOAL #4   Title Reduce pain with functional activities to equal or less than 2/10 to allow patient to complete usual activities including ADLs, IADLs, and social engagement with less difficulty.    Baseline up to 10/10 (06/17/2021);    Time 12    Period Weeks    Status New      PT LONG TERM GOAL #5   Title Complete community, work and/or recreational activities without limitation due to current condition.    Baseline Functional Limitations: walking, sitting, changing positions, bed mobility, household and community mobility, stairs, lifting, moving L LE, house cleaning, shopping cart, sleeping, going to school, exercising.    Time 12    Period Weeks    Status New      Additional Long  Term Goals   Additional Long Term Goals Yes      PT LONG TERM GOAL #6   Title Patient will ambulate equal or greater than 1000 feet with no AD on 6 Minute Walk Test to demonstrate improved household and community mobility.    Baseline unable to tolerate test - difficulty walking ~ 50 feet from/to waiting room with [facility] (06/17/2021);    Time 12    Period Weeks    Status New                   Plan - 07/06/21 0944     Clinical Impression Statement Patient tolerated treatment well overall and reports feeling better by end of session. Was very tender to dry needling while needle was in muscle but no worse after. Updated HEP appropriately. Plan to follow up with effects of dry needling next session. No improvement in self-reported function today compared to at initial eval per FOTO score. Patient would benefit from continued management of limiting condition by skilled physical therapist to address remaining impairments and functional limitations to work towards stated goals and return to PLOF or maximal functional independence.    Personal Factors and Comorbidities Age;Past/Current Experience;Comorbidity 3+;Fitness;Time since onset of injury/illness/exacerbation    Comorbidities Relevant past medical history and comorbidities include COVID19 with 2 PE found in August 2022 (on eliquis), shortness of breath, history of CVA (2008, affects left side mostly LE), HTN, osteoporosis, Crohn's disease (currently gets infusions), osteopenia, glaucoma, cataract removal, hx of depression (weaning medications with MD supervision), breast cancer (2006), clotting disorder, hypothyroidism, cardiac catheterization.    Examination-Activity Limitations Bed Mobility;Bathing;Hygiene/Grooming;Squat;Stairs;Lift;Bend;Locomotion Level;Stand;Caring for Others;Carry;Transfers;Sit;Dressing;Sleep    Examination-Participation Restrictions Laundry;Cleaning;Shop;Community Activity;Meal Prep;Yard Work;Interpersonal Relationship    walking, sitting, changing positions, bed mobility, household and community mobility, stairs, lifting, moving L LE, house cleaning, shopping cart, sleeping, going  to school, exercising.   Stability/Clinical Decision Making Stable/Uncomplicated    Rehab Potential Good    PT Frequency 2x / week    PT Duration 12 weeks    PT Treatment/Interventions ADLs/Self Care Home Management;Aquatic Therapy;Biofeedback;Cryotherapy;Moist Heat;Traction;Electrical Stimulation;DME Instruction;Gait training;Stair training;Functional mobility training;Neuromuscular re-education;Balance training;Therapeutic exercise;Therapeutic activities;Patient/family education;Dry needling;Passive range of motion;Manual techniques;Spinal Manipulations    PT Next Visit Plan update HEP as appropriate, manual therapy as needed, trunk and LE strenghtening and stretching as tolerated,    PT Home Exercise Plan [place] Access Code: [email or web address]    Consulted and Agree with Plan of Care Patient             Patient will benefit from skilled therapeutic intervention in order to improve the following deficits and impairments:  Abnormal gait, Decreased knowledge of use of DME, Impaired sensation, Improper body mechanics, Pain, Cardiopulmonary status limiting activity, Decreased mobility, Postural dysfunction, Increased muscle spasms, Decreased activity tolerance, Decreased endurance, Decreased range of motion, Decreased strength, Hypomobility, Impaired perceived functional ability, Decreased balance, Difficulty walking, Impaired flexibility  Visit Diagnosis: Chronic left-sided low back pain with left-sided sciatica  Difficulty in walking, not elsewhere classified  Muscle weakness (generalized)  Unsteadiness on feet     Problem List Patient Active Problem List   Diagnosis Date Noted   Low back pain radiating to left leg 06/17/2021   Glaucoma 02/23/2021   Other long term (current) drug therapy 01/22/2021   Varicose veins of both  lower extremities with pain 07/19/2018   Arthritis of right hand 12/21/2017   Immunosuppressed status ([place]) 12/21/2017   Major depression in remission ([place]) 12/21/2017   Fibrocystic breast changes 06/22/2017   Osteopenia 10/18/2016   Atherosclerosis of aorta ([place]) 12/19/2015   Vitreous degeneration 05/21/2015   Glaucoma associated with chamber angle anomalies 05/21/2015   Crohn's disease ([place]) 05/21/2015   H/O malignant neoplasm of breast 05/21/2015   Crohn's disease of both small and large intestine with rectal bleeding ([place]) 12/04/2014   Solitary pulmonary nodule 11/07/2012   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 Graylon Good, PT, DPT 07/06/21, 10:59 AM   [facility] [address], [place], [address] Phone: [phone]   Fax:  [phone]  Name: Carolyn Campbell MRN: [id number] Date of Birth: [birth year]

## 2021-07-08 ENCOUNTER — Ambulatory Visit: Payer: Medicare Other | Admitting: Physical Therapy

## 2021-07-13 ENCOUNTER — Ambulatory Visit: Payer: Medicare Other | Admitting: Physical Therapy

## 2021-07-13 ENCOUNTER — Encounter: Payer: Self-pay | Admitting: Physical Therapy

## 2021-07-13 DIAGNOSIS — R2681 Unsteadiness on feet: Secondary | ICD-10-CM

## 2021-07-13 DIAGNOSIS — M6281 Muscle weakness (generalized): Secondary | ICD-10-CM

## 2021-07-13 DIAGNOSIS — R262 Difficulty in walking, not elsewhere classified: Secondary | ICD-10-CM

## 2021-07-13 DIAGNOSIS — M5442 Lumbago with sciatica, left side: Secondary | ICD-10-CM | POA: Diagnosis not present

## 2021-07-13 DIAGNOSIS — G8929 Other chronic pain: Secondary | ICD-10-CM

## 2021-07-13 NOTE — Therapy (Signed)
[facility] [address], [place], [address] Phone: [phone]   Fax:  [phone]  Physical Therapy Treatment  Patient Details  Name: Carolyn Campbell MRN: [id number] Date of Birth: 01-11-1956 Referring Provider (PT): Girtha Hake, MD   Encounter Date: 07/13/2021   PT End of Session - 07/13/21 0953     Visit Number 6    Number of Visits 24    Date for PT Re-Evaluation 09/09/21    Authorization Type [employer] MEDICARE reporting period from 06/17/2021    Progress Note Due on Visit 10    PT Start Time 0949    PT Stop Time 1027    PT Time Calculation (min) 38 min    Activity Tolerance Patient tolerated treatment well    Behavior During Therapy [facility] for tasks assessed/performed             Past Medical History:  Diagnosis Date   Abdominal pain, epigastric    Allergic rhinitis    Anginal pain ([place])    states MD said it was GERD   Arthritis    Asthma    Breast cancer ([place]) 2006   LT LUMPECTOMY   CD (Crohn's disease) ([place]) 05/21/2015   FOLLOWED BY GI    Clotting disorder ([place])    Cognitive deficits as late effect of cerebrovascular disease    COPD (chronic obstructive pulmonary disease) ([place])    Crohn's disease of both small and large intestine with rectal bleeding ([place]) 12/04/2014   Depression    Currently taking zoloft.   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    Dyspnea    Esophageal reflux    Fever blister 07/19/2018   Glaucoma    vitreous degeneration   History of kidney stones    Hyperlipidemia    Hypertension    Hypothyroidism    Occlusion, cerebral artery    NOS w/infarction   Osteoporosis    Personal history of radiation therapy 2006   BREAST CA   Stroke [facility])    Ulcer     Past Surgical History:  Procedure Laterality Date   BREAST BIOPSY Left 2006   POS   BREAST LUMPECTOMY Left 09/20/2004   positive   BREAST SURGERY Left     malignant biopsy   CARDIAC CATHETERIZATION     COLONOSCOPY  2015   COLONOSCOPY WITH PROPOFOL N/A 06/08/2017   Procedure: COLONOSCOPY WITH PROPOFOL;  Surgeon: Lin Landsman, MD;  Location: [facility];  Service: Gastroenterology;  Laterality: N/A;   COLONOSCOPY WITH PROPOFOL N/A 03/08/2019   Procedure: COLONOSCOPY WITH PROPOFOL;  Surgeon: Lin Landsman, MD;  Location: [facility] ENDOSCOPY;  Service: Gastroenterology;  Laterality: N/A;   COLONOSCOPY WITH PROPOFOL N/A 07/16/2020   Procedure: COLONOSCOPY WITH PROPOFOL;  Surgeon: Lin Landsman, MD;  Location: [facility] ENDOSCOPY;  Service: Gastroenterology;  Laterality: N/A;   ESOPHAGOGASTRODUODENOSCOPY  2015   ESOPHAGOGASTRODUODENOSCOPY (EGD) WITH PROPOFOL N/A 06/08/2017   Procedure: ESOPHAGOGASTRODUODENOSCOPY (EGD) WITH PROPOFOL;  Surgeon: Lin Landsman, MD;  Location: [facility];  Service: Gastroenterology;  Laterality: N/A;   ESOPHAGOGASTRODUODENOSCOPY (EGD) WITH PROPOFOL N/A 03/08/2019   Procedure: ESOPHAGOGASTRODUODENOSCOPY (EGD) WITH PROPOFOL;  Surgeon: Lin Landsman, MD;  Location: [facility] ENDOSCOPY;  Service: Gastroenterology;  Laterality: N/A;   FINGER SURGERY     Right small finger   FRACTURE SURGERY     GIVENS CAPSULE STUDY  2015   TUBAL LIGATION  There were no vitals filed for this visit.   Subjective Assessment - 07/13/21 0951     Subjective [name] reports her pain has been pretty good today and the last two days. She rates her current pain 1/10 in the left lateral hip/glute region. Felt okay during the rest of her day following last PT session but the next day she felt really bad and did not come to her next PT appointment becasue she awoke with so much pain down her entire leg. She went to the doctor who gave her a different medication (patient thinks cymbalta). She started taking that last week but cannot remember which day. Unsure if dry needling helped at all.    Pertinent History Patient is a  65 y.o. 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 This patient's chief complaints consist of left buttock pain with radiation into L LE to foot and up to low back leading to the following functional deficits: difficulty with usual activities such as walking, sitting, changing positions, bed mobility, household and community mobility, stairs, lifting, moving L LE, house cleaning, shopping cart, sleeping, going to school, exercising. .  Relevant past medical history and comorbidities include COVID19 with 2 PE found in August 2022 (on eliquis), shortness of breath, history of CVA (2008, affects left side mostly LE), HTN, osteoporosis, Crohn's disease (currently gets infusions), osteopenia, glaucoma, cataract removal, hx of depression (weaning medications with MD supervision), breast cancer (2006), clotting disorder, hypothyroidism, cardiac catheterization. Patient denies hx of seizures, lung problem, diabetes, unexplained weight loss, changes in bowel or bladder problems, new onset stumbling or dropping things apart from history (L leg feels like it gives out more since leg pain), history of spinal surgery.    Limitations Lifting;Standing;Walking;House hold activities   walking, sitting, changing positions, bed mobility, household and community mobility, stairs, lifting, moving L LE, house cleaning, shopping cart, sleeping, going to school, exercising.   Diagnostic tests Lumbar MRI report 11/02/2020: "IMPRESSION:  Lumbar spondylosis as outlined.  At L4-L5, there is mild/moderate disc degeneration. Grade 1  anterolisthesis. Posterior annular fissure. Disc uncovering with  disc bulge. Moderate facet arthrosis with mild ligamentum flavum  hypertrophy. Trace right facet joint effusion. Bilateral neural  foraminal  narrowing (moderate right, mild left).  No more than mild spinal canal or neural foraminal narrowing at the  remaining levels.  Edema and enhancement within the right L4 and L5 pedicles/articular  pillars, which may be degenerative or may reflect stress reaction.  Degenerative marrow edema and enhancement along the right L4-L5  facet joint."    Chest CT report 05/05/2021: "IMPRESSION:  Small filling defects are noted in upper and lower lobe branches of  the left pulmonary artery consistent with acute pulmonary emboli.  Critical Value/emergent results were called by telephone at the time  of interpretation on 05/05/2021 at 3:08 pm to provider Lavonia Drafts  , who verbally acknowledged these results.  Aortic Atherosclerosis (ICD10-I70.0)."    Patient Stated Goals want to be able to move around without hurting so much    Currently in Pain? Yes    Pain Score 1     Pain Onset More than a month ago               TREATMENT:   denies sensitivity to latex   Manual therapy: to reduce pain  and tissue tension, improve range of motion, neuromodulation, in order to promote improved ability to complete functional activities. SUPINE  - LAD through left hip, 4x30 seconds   Therapeutic exercise: to centralize symptoms and improve ROM, strength, muscular endurance, and activity tolerance required for successful completion of functional activities.   - left side glide at wall (R side toward wall), 3x20 (walking in between demo improvement).  (Manual therapy - see above).  - prone press up 2x10 - supine sciatic nerve glide, 1x15 left side (states she always feels this one pulling).  - hooklying abdominal brace, with alternating LE extension, 1x10n each side (easy) - hooklying reverse curl, 1x10 with 5 second hold. Cuing to engage transverse abdominis.  - Quadruped bird dog (alternating shoulder flexion/contralateral hip extension with core muscles braced).  Cuing for abdominal brace. 2x10 each side. Added half  foam roll/foam roll over lower back for feedback.   - prone press up 2x10   Pt required multimodal cuing for proper technique and to facilitate improved neuromuscular control, strength, range of motion, and functional ability resulting in improved performance and form.     HOME EXERCISE PROGRAM Access Code: [email or web address] URL: [email or web address] Date: 07/13/2021 Prepared by: Rosita Kea  Exercises Seated Posture with Lumbar Roll Right Standing Lateral Shift Correction at Wall - Repetitions - 3 x daily - 1 sets - 20 reps - or when pain is present Prone Press Up - 4 x daily - 10-15 reps - 1 second hold Supine 90/90 Sciatic Nerve Glide with Knee Flexion/Extension - 1-2 x daily - 15 reps Hooklying Transversus Abdominis Palpation - 1 x daily - 1 sets - 20 reps - 5 seconds hold Reverse Curl - 1 x daily - 1-2 sets - 10 reps - 5 seconds hold Bridge with Hip Abduction and Resistance - Ground Touches - 1 x daily - 1 sets - 20 reps - 5 seconds hold Bird Dog - 1 x daily - 2-3 sets - 10 reps - 1 circle hold    PT Education - 07/13/21 0953     Education Details Exercise purpose/form. Self management techniques    Person(s) Educated Patient    Methods Explanation;Demonstration;Tactile cues;Verbal cues    Comprehension Verbalized understanding;Returned demonstration;Verbal cues required;Tactile cues required;Need further instruction              PT Short Term Goals - 07/06/21 0944       PT SHORT TERM GOAL #1   Title Be independent with initial home exercise program for self-management of symptoms.    Baseline Initial HEP provided at IE (06/17/2021);    Time 2    Period Weeks    Status Achieved    Target Date 07/01/21               PT Long Term Goals - 06/17/21 1507       PT LONG TERM GOAL #1   Title Be independent with a long-term home exercise program for self-management of symptoms.    Baseline Initial HEP provided at IE (06/17/2021);    Time 12    Period  Weeks    Status New   TARGET DATE FOR ALL LONG TERM GOALS: 09/09/2021     PT LONG TERM GOAL #2   Title Demonstrate improved FOTO score to equal or greater than 50 by visit #12 to demonstrate improvement in overall condition and self-reported functional ability.    Baseline 40 (06/17/2021);    Time 12    Period Weeks  Status New      PT LONG TERM GOAL #3   Title Have full lumbar AROM with no compensations or increase in pain in all planes except intermittent end range discomfort to allow patient to complete valued activities with less difficulty.    Baseline painful and limited - see objective exam (06/17/2021);    Time 12    Period Weeks    Status New      PT LONG TERM GOAL #4   Title Reduce pain with functional activities to equal or less than 2/10 to allow patient to complete usual activities including ADLs, IADLs, and social engagement with less difficulty.    Baseline up to 10/10 (06/17/2021);    Time 12    Period Weeks    Status New      PT LONG TERM GOAL #5   Title Complete community, work and/or recreational activities without limitation due to current condition.    Baseline Functional Limitations: walking, sitting, changing positions, bed mobility, household and community mobility, stairs, lifting, moving L LE, house cleaning, shopping cart, sleeping, going to school, exercising.    Time 12    Period Weeks    Status New      Additional Long Term Goals   Additional Long Term Goals Yes      PT LONG TERM GOAL #6   Title Patient will ambulate equal or greater than 1000 feet with no AD on 6 Minute Walk Test to demonstrate improved household and community mobility.    Baseline unable to tolerate test - difficulty walking ~ 50 feet from/to waiting room with [facility] (06/17/2021);    Time 12    Period Weeks    Status New                   Plan - 07/13/21 1005     Clinical Impression Statement Patient arrives with decreased/centralized pain today but reports increased  pain that caused her to miss her last PT appointment. Continues to report walking up in more pain or less pain from the start of the day. No obvious benefit from dry needling last session. Patient started taking a new medication that may have affected her symptoms as well. Today's session focused on continued use of specific exercise and core/functional strengthening. Patient would benefit from continued management of limiting condition by skilled physical therapist to address remaining impairments and functional limitations to work towards stated goals and return to PLOF or maximal functional independence.    Personal Factors and Comorbidities Age;Past/Current Experience;Comorbidity 3+;Fitness;Time since onset of injury/illness/exacerbation    Comorbidities Relevant past medical history and comorbidities include COVID19 with 2 PE found in August 2022 (on eliquis), shortness of breath, history of CVA (2008, affects left side mostly LE), HTN, osteoporosis, Crohn's disease (currently gets infusions), osteopenia, glaucoma, cataract removal, hx of depression (weaning medications with MD supervision), breast cancer (2006), clotting disorder, hypothyroidism, cardiac catheterization.    Examination-Activity Limitations Bed Mobility;Bathing;Hygiene/Grooming;Squat;Stairs;Lift;Bend;Locomotion Level;Stand;Caring for Others;Carry;Transfers;Sit;Dressing;Sleep    Examination-Participation Restrictions Laundry;Cleaning;Shop;Community Activity;Meal Prep;Yard Work;Interpersonal Relationship   walking, sitting, changing positions, bed mobility, household and community mobility, stairs, lifting, moving L LE, house cleaning, shopping cart, sleeping, going to school, exercising.   Stability/Clinical Decision Making Stable/Uncomplicated    Rehab Potential Good    PT Frequency 2x / week    PT Duration 12 weeks    PT Treatment/Interventions ADLs/Self Care Home Management;Aquatic Therapy;Biofeedback;Cryotherapy;Moist  Heat;Traction;Electrical Stimulation;DME Instruction;Gait training;Stair training;Functional mobility training;Neuromuscular re-education;Balance training;Therapeutic exercise;Therapeutic activities;Patient/family education;Dry needling;Passive range of motion;Manual techniques;Spinal  Manipulations    PT Next Visit Plan update HEP as appropriate, manual therapy as needed, trunk and LE strenghtening and stretching as tolerated,    PT Home Exercise Plan [place] Access Code: [email or web address]    Consulted and Agree with Plan of Care Patient             Patient will benefit from skilled therapeutic intervention in order to improve the following deficits and impairments:  Abnormal gait, Decreased knowledge of use of DME, Impaired sensation, Improper body mechanics, Pain, Cardiopulmonary status limiting activity, Decreased mobility, Postural dysfunction, Increased muscle spasms, Decreased activity tolerance, Decreased endurance, Decreased range of motion, Decreased strength, Hypomobility, Impaired perceived functional ability, Decreased balance, Difficulty walking, Impaired flexibility  Visit Diagnosis: Chronic left-sided low back pain with left-sided sciatica  Difficulty in walking, not elsewhere classified  Muscle weakness (generalized)  Unsteadiness on feet     Problem List Patient Active Problem List   Diagnosis Date Noted   Low back pain radiating to left leg 06/17/2021   Glaucoma 02/23/2021   Other long term (current) drug therapy 01/22/2021   Varicose veins of both lower extremities with pain 07/19/2018   Arthritis of right hand 12/21/2017   Immunosuppressed status ([place]) 12/21/2017   Major depression in remission ([place]) 12/21/2017   Fibrocystic breast changes 06/22/2017   Osteopenia 10/18/2016   Atherosclerosis of aorta ([place]) 12/19/2015   Vitreous degeneration 05/21/2015   Glaucoma associated with chamber angle anomalies 05/21/2015   Crohn's disease ([place]) 05/21/2015   H/O malignant  neoplasm of breast 05/21/2015   Crohn's disease of both small and large intestine with rectal bleeding ([place]) 12/04/2014   Solitary pulmonary nodule 11/07/2012   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 Graylon Good, PT, DPT 07/13/21, 10:29 AM   [facility] [address] [place], [address] Phone: [phone]   Fax:  [phone]  Name: Carolyn Campbell MRN: [id number] Date of Birth: 01-24-1956

## 2021-07-15 ENCOUNTER — Ambulatory Visit: Payer: Medicare Other | Admitting: Physical Therapy

## 2021-07-15 ENCOUNTER — Encounter: Payer: Self-pay | Admitting: Physical Therapy

## 2021-07-15 DIAGNOSIS — G8929 Other chronic pain: Secondary | ICD-10-CM

## 2021-07-15 DIAGNOSIS — M6281 Muscle weakness (generalized): Secondary | ICD-10-CM

## 2021-07-15 DIAGNOSIS — R2681 Unsteadiness on feet: Secondary | ICD-10-CM

## 2021-07-15 DIAGNOSIS — M5442 Lumbago with sciatica, left side: Secondary | ICD-10-CM | POA: Diagnosis not present

## 2021-07-15 DIAGNOSIS — R262 Difficulty in walking, not elsewhere classified: Secondary | ICD-10-CM

## 2021-07-15 NOTE — Therapy (Signed)
[facility] [address], [place], [address] Phone: [phone]   Fax:  [phone]  Physical Therapy Treatment  Patient Details  Name: Carolyn Campbell MRN: [id number] Date of Birth: 1955/11/01 Referring Provider (PT): Girtha Hake, MD   Encounter Date: 07/15/2021   PT End of Session - 07/15/21 0905     Visit Number 7    Number of Visits 24    Date for PT Re-Evaluation 09/09/21    Authorization Type [employer] MEDICARE reporting period from 06/17/2021    Progress Note Due on Visit 10    PT Start Time 0902    PT Stop Time 0942    PT Time Calculation (min) 40 min    Activity Tolerance Patient tolerated treatment well    Behavior During Therapy [facility] for tasks assessed/performed             Past Medical History:  Diagnosis Date   Abdominal pain, epigastric    Allergic rhinitis    Anginal pain ([place])    states MD said it was GERD   Arthritis    Asthma    Breast cancer ([place]) 2006   LT LUMPECTOMY   CD (Crohn's disease) ([place]) 05/21/2015   FOLLOWED BY GI    Clotting disorder ([place])    Cognitive deficits as late effect of cerebrovascular disease    COPD (chronic obstructive pulmonary disease) ([place])    Crohn's disease of both small and large intestine with rectal bleeding ([place]) 12/04/2014   Depression    Currently taking zoloft.   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    Dyspnea    Esophageal reflux    Fever blister 07/19/2018   Glaucoma    vitreous degeneration   History of kidney stones    Hyperlipidemia    Hypertension    Hypothyroidism    Occlusion, cerebral artery    NOS w/infarction   Osteoporosis    Personal history of radiation therapy 2006   BREAST CA   Stroke [facility])    Ulcer     Past Surgical History:  Procedure Laterality Date   BREAST BIOPSY Left 2006   POS   BREAST LUMPECTOMY Left 09/20/2004   positive   BREAST SURGERY Left     malignant biopsy   CARDIAC CATHETERIZATION     COLONOSCOPY  2015   COLONOSCOPY WITH PROPOFOL N/A 06/08/2017   Procedure: COLONOSCOPY WITH PROPOFOL;  Surgeon: Lin Landsman, MD;  Location: [place];  Service: Gastroenterology;  Laterality: N/A;   COLONOSCOPY WITH PROPOFOL N/A 03/08/2019   Procedure: COLONOSCOPY WITH PROPOFOL;  Surgeon: Lin Landsman, MD;  Location: [facility] ENDOSCOPY;  Service: Gastroenterology;  Laterality: N/A;   COLONOSCOPY WITH PROPOFOL N/A 07/16/2020   Procedure: COLONOSCOPY WITH PROPOFOL;  Surgeon: Lin Landsman, MD;  Location: [facility] ENDOSCOPY;  Service: Gastroenterology;  Laterality: N/A;   ESOPHAGOGASTRODUODENOSCOPY  2015   ESOPHAGOGASTRODUODENOSCOPY (EGD) WITH PROPOFOL N/A 06/08/2017   Procedure: ESOPHAGOGASTRODUODENOSCOPY (EGD) WITH PROPOFOL;  Surgeon: Lin Landsman, MD;  Location: [place];  Service: Gastroenterology;  Laterality: N/A;   ESOPHAGOGASTRODUODENOSCOPY (EGD) WITH PROPOFOL N/A 03/08/2019   Procedure: ESOPHAGOGASTRODUODENOSCOPY (EGD) WITH PROPOFOL;  Surgeon: Lin Landsman, MD;  Location: [facility] ENDOSCOPY;  Service: Gastroenterology;  Laterality: N/A;   FINGER SURGERY     Right small finger   FRACTURE SURGERY     GIVENS CAPSULE STUDY  2015   TUBAL LIGATION  There were no vitals filed for this visit.   Subjective Assessment - 07/15/21 0904     Subjective Patient reports she is feeling good today but she still has a bit of achy feeling in her left thigh and calf. Agrees this could be 1/10 pain rating but states it is not bothersome pain. States she felt okay after last PT session. States her HEP is going well and she has no questions about it.    Pertinent History Patient is a 65 y.o. 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 This patient's chief complaints consist of left buttock pain with radiation into L LE to foot and up to low back leading to the following functional deficits: difficulty with usual activities such as walking, sitting, changing positions, bed mobility, household and community mobility, stairs, lifting, moving L LE, house cleaning, shopping cart, sleeping, going to school, exercising. .  Relevant past medical history and comorbidities include COVID19 with 2 PE found in August 2022 (on eliquis), shortness of breath, history of CVA (2008, affects left side mostly LE), HTN, osteoporosis, Crohn's disease (currently gets infusions), osteopenia, glaucoma, cataract removal, hx of depression (weaning medications with MD supervision), breast cancer (2006), clotting disorder, hypothyroidism, cardiac catheterization. Patient denies hx of seizures, lung problem, diabetes, unexplained weight loss, changes in bowel or bladder problems, new onset stumbling or dropping things apart from history (L leg feels like it gives out more since leg pain), history of spinal surgery.    Limitations Lifting;Standing;Walking;House hold activities   walking, sitting, changing positions, bed mobility, household and community mobility, stairs, lifting, moving L LE, house cleaning, shopping cart, sleeping, going to school, exercising.   Diagnostic tests Lumbar MRI report 11/02/2020: "IMPRESSION:  Lumbar spondylosis as outlined.  At L4-L5, there is mild/moderate disc degeneration. Grade 1  anterolisthesis. Posterior annular fissure. Disc uncovering with  disc bulge. Moderate facet arthrosis with mild ligamentum flavum  hypertrophy. Trace right facet joint effusion. Bilateral neural  foraminal narrowing (moderate right, mild left).  No more than mild spinal canal or neural foraminal narrowing at the  remaining levels.  Edema and enhancement within the right L4 and L5 pedicles/articular  pillars, which may be degenerative or may  reflect stress reaction.  Degenerative marrow edema and enhancement along the right L4-L5  facet joint."    Chest CT report 05/05/2021: "IMPRESSION:  Small filling defects are noted in upper and lower lobe branches of  the left pulmonary artery consistent with acute pulmonary emboli.  Critical Value/emergent results were called by telephone at the time  of interpretation on 05/05/2021 at 3:08 pm to provider Lavonia Drafts  , who verbally acknowledged these results.  Aortic Atherosclerosis (ICD10-I70.0)."    Patient Stated Goals want to be able to move around without hurting so much    Currently in Pain? Yes    Pain Score 1     Pain Onset More than a month ago               TREATMENT:   denies sensitivity to latex   Manual therapy: to reduce pain and tissue tension, improve range of motion, neuromodulation, in order to promote improved ability to complete functional activities. SUPINE  - LAD through left hip, 4x30 seconds   Therapeutic exercise: to centralize symptoms and improve ROM, strength, muscular endurance, and  activity tolerance required for successful completion of functional activities.   - left side glide at wall (R side toward wall), 3x20 (walking in between demo improvement).  (Manual therapy - see above).  - prone press up 2x10 - supine sciatic nerve glide, 1x15 left side (states she feels this one pulling).  - hooklying reverse curl, 2x10 with 5 second hold. Cuing to engage transverse abdominis. Metronome to help with counting speed.  - Quadruped bird dog (alternating shoulder flexion/contralateral hip extension with core muscles braced).  Cuing for abdominal brace. 2x10 each side with 5 second hold with metronome. Added half foam roll/foam roll over lower back for feedback.   - prone press up 2x10 - buttocks taps on 18.5 inch plinth while holding 6k med ball in front of body. Cuing for abdominal bracing and core stability. 2x10 - prone press up 2x10  Pt required multimodal  cuing for proper technique and to facilitate improved neuromuscular control, strength, range of motion, and functional ability resulting in improved performance and form.     HOME EXERCISE PROGRAM Access Code: [email or web address] URL: [email or web address] Date: 07/13/2021 Prepared by: Rosita Kea   Exercises Seated Posture with Lumbar Roll Right Standing Lateral Shift Correction at Wall - Repetitions - 3 x daily - 1 sets - 20 reps - or when pain is present Prone Press Up - 4 x daily - 10-15 reps - 1 second hold Supine 90/90 Sciatic Nerve Glide with Knee Flexion/Extension - 1-2 x daily - 15 reps Hooklying Transversus Abdominis Palpation - 1 x daily - 1 sets - 20 reps - 5 seconds hold Reverse Curl - 1 x daily - 1-2 sets - 10 reps - 5 seconds hold Bridge with Hip Abduction and Resistance - Ground Touches - 1 x daily - 1 sets - 20 reps - 5 seconds hold Bird Dog - 1 x daily - 2-3 sets - 10 reps - 1 circle hold     PT Education - 07/15/21 0905     Education Details Exercise purpose/form. Self management techniques    Person(s) Educated Patient    Methods Explanation;Tactile cues;Demonstration;Verbal cues    Comprehension Verbalized understanding;Returned demonstration;Verbal cues required;Tactile cues required;Need further instruction              PT Short Term Goals - 07/06/21 0944       PT SHORT TERM GOAL #1   Title Be independent with initial home exercise program for self-management of symptoms.    Baseline Initial HEP provided at IE (06/17/2021);    Time 2    Period Weeks    Status Achieved    Target Date 07/01/21               PT Long Term Goals - 06/17/21 1507       PT LONG TERM GOAL #1   Title Be independent with a long-term home exercise program for self-management of symptoms.    Baseline Initial HEP provided at IE (06/17/2021);    Time 12    Period Weeks    Status New   TARGET DATE FOR ALL LONG TERM GOALS: 09/09/2021     PT LONG TERM GOAL #2    Title Demonstrate improved FOTO score to equal or greater than 50 by visit #12 to demonstrate improvement in overall condition and self-reported functional ability.    Baseline 40 (06/17/2021);    Time 12    Period Weeks    Status New      PT LONG TERM GOAL #3  Title Have full lumbar AROM with no compensations or increase in pain in all planes except intermittent end range discomfort to allow patient to complete valued activities with less difficulty.    Baseline painful and limited - see objective exam (06/17/2021);    Time 12    Period Weeks    Status New      PT LONG TERM GOAL #4   Title Reduce pain with functional activities to equal or less than 2/10 to allow patient to complete usual activities including ADLs, IADLs, and social engagement with less difficulty.    Baseline up to 10/10 (06/17/2021);    Time 12    Period Weeks    Status New      PT LONG TERM GOAL #5   Title Complete community, work and/or recreational activities without limitation due to current condition.    Baseline Functional Limitations: walking, sitting, changing positions, bed mobility, household and community mobility, stairs, lifting, moving L LE, house cleaning, shopping cart, sleeping, going to school, exercising.    Time 12    Period Weeks    Status New      Additional Long Term Goals   Additional Long Term Goals Yes      PT LONG TERM GOAL #6   Title Patient will ambulate equal or greater than 1000 feet with no AD on 6 Minute Walk Test to demonstrate improved household and community mobility.    Baseline unable to tolerate test - difficulty walking ~ 50 feet from/to waiting room with [facility] (06/17/2021);    Time 12    Period Weeks    Status New                   Plan - 07/15/21 0915     Clinical Impression Statement Patient tolerated treatment well overall with no increase in pain by end of session. Continues to report symptoms in left LE but overall improved. Today's session continued to  focus on specific exercise, pain relief strategies, and core and functional strengthening to improve activity tolerance. Patient would benefit from continued management of limiting condition by skilled physical therapist to address remaining impairments and functional limitations to work towards stated goals and return to PLOF or maximal functional independence.    Personal Factors and Comorbidities Age;Past/Current Experience;Comorbidity 3+;Fitness;Time since onset of injury/illness/exacerbation    Comorbidities Relevant past medical history and comorbidities include COVID19 with 2 PE found in August 2022 (on eliquis), shortness of breath, history of CVA (2008, affects left side mostly LE), HTN, osteoporosis, Crohn's disease (currently gets infusions), osteopenia, glaucoma, cataract removal, hx of depression (weaning medications with MD supervision), breast cancer (2006), clotting disorder, hypothyroidism, cardiac catheterization.    Examination-Activity Limitations Bed Mobility;Bathing;Hygiene/Grooming;Squat;Stairs;Lift;Bend;Locomotion Level;Stand;Caring for Others;Carry;Transfers;Sit;Dressing;Sleep    Examination-Participation Restrictions Laundry;Cleaning;Shop;Community Activity;Meal Prep;Yard Work;Interpersonal Relationship   walking, sitting, changing positions, bed mobility, household and community mobility, stairs, lifting, moving L LE, house cleaning, shopping cart, sleeping, going to school, exercising.   Stability/Clinical Decision Making Stable/Uncomplicated    Rehab Potential Good    PT Frequency 2x / week    PT Duration 12 weeks    PT Treatment/Interventions ADLs/Self Care Home Management;Aquatic Therapy;Biofeedback;Cryotherapy;Moist Heat;Traction;Electrical Stimulation;DME Instruction;Gait training;Stair training;Functional mobility training;Neuromuscular re-education;Balance training;Therapeutic exercise;Therapeutic activities;Patient/family education;Dry needling;Passive range of  motion;Manual techniques;Spinal Manipulations    PT Next Visit Plan update HEP as appropriate, manual therapy as needed, trunk and LE strenghtening and stretching as tolerated,    PT Home Exercise Plan [facility] Access Code: [email or web address]    Consulted and  Agree with Plan of Care Patient             Patient will benefit from skilled therapeutic intervention in order to improve the following deficits and impairments:  Abnormal gait, Decreased knowledge of use of DME, Impaired sensation, Improper body mechanics, Pain, Cardiopulmonary status limiting activity, Decreased mobility, Postural dysfunction, Increased muscle spasms, Decreased activity tolerance, Decreased endurance, Decreased range of motion, Decreased strength, Hypomobility, Impaired perceived functional ability, Decreased balance, Difficulty walking, Impaired flexibility  Visit Diagnosis: Chronic left-sided low back pain with left-sided sciatica  Difficulty in walking, not elsewhere classified  Muscle weakness (generalized)  Unsteadiness on feet     Problem List Patient Active Problem List   Diagnosis Date Noted   Low back pain radiating to left leg 06/17/2021   Glaucoma 02/23/2021   Other long term (current) drug therapy 01/22/2021   Varicose veins of both lower extremities with pain 07/19/2018   Arthritis of right hand 12/21/2017   Immunosuppressed status ([place]) 12/21/2017   Major depression in remission ([place]) 12/21/2017   Fibrocystic breast changes 06/22/2017   Osteopenia 10/18/2016   Atherosclerosis of aorta ([place]) 12/19/2015   Vitreous degeneration 05/21/2015   Glaucoma associated with chamber angle anomalies 05/21/2015   Crohn's disease ([place]) 05/21/2015   H/O malignant neoplasm of breast 05/21/2015   Crohn's disease of both small and large intestine with rectal bleeding ([place]) 12/04/2014   Solitary pulmonary nodule 11/07/2012   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 Graylon Good, PT, DPT 07/15/21, 9:16 AM  [facility] [address], [place], [address] Phone: [phone]   Fax:  [phone]  Name: NAMIRA ROSEKRANS MRN: [id number] Date of Birth: May 26, 1956

## 2021-07-20 ENCOUNTER — Ambulatory Visit: Payer: Medicare Other | Admitting: Physical Therapy

## 2021-07-20 ENCOUNTER — Encounter: Payer: Self-pay | Admitting: Physical Therapy

## 2021-07-20 DIAGNOSIS — M6281 Muscle weakness (generalized): Secondary | ICD-10-CM

## 2021-07-20 DIAGNOSIS — R262 Difficulty in walking, not elsewhere classified: Secondary | ICD-10-CM

## 2021-07-20 DIAGNOSIS — R2681 Unsteadiness on feet: Secondary | ICD-10-CM

## 2021-07-20 DIAGNOSIS — G8929 Other chronic pain: Secondary | ICD-10-CM

## 2021-07-20 DIAGNOSIS — M5442 Lumbago with sciatica, left side: Secondary | ICD-10-CM | POA: Diagnosis not present

## 2021-07-20 NOTE — Therapy (Signed)
[facility] [address], [place], [address] Phone: [phone]   Fax:  [phone]  Physical Therapy Treatment  Patient Details  Name: Carolyn Campbell MRN: [id number] Date of Birth: June 25, 1956 Referring Provider (PT): Girtha Hake, MD   Encounter Date: 07/20/2021   PT End of Session - 07/20/21 1121     Visit Number 8    Number of Visits 24    Date for PT Re-Evaluation 09/09/21    Authorization Type [employer] MEDICARE reporting period from 06/17/2021    Progress Note Due on Visit 10    PT Start Time 1118    PT Stop Time 1158    PT Time Calculation (min) 40 min    Activity Tolerance Patient tolerated treatment well    Behavior During Therapy [facility] for tasks assessed/performed             Past Medical History:  Diagnosis Date   Abdominal pain, epigastric    Allergic rhinitis    Anginal pain ([place])    states MD said it was GERD   Arthritis    Asthma    Breast cancer ([employer]) 2006   LT LUMPECTOMY   CD (Crohn's disease) ([place]) 05/21/2015   FOLLOWED BY GI    Clotting disorder ([place])    Cognitive deficits as late effect of cerebrovascular disease    COPD (chronic obstructive pulmonary disease) ([place])    Crohn's disease of both small and large intestine with rectal bleeding ([place]) 12/04/2014   Depression    Currently taking zoloft.   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    Dyspnea    Esophageal reflux    Fever blister 07/19/2018   Glaucoma    vitreous degeneration   History of kidney stones    Hyperlipidemia    Hypertension    Hypothyroidism    Occlusion, cerebral artery    NOS w/infarction   Osteoporosis    Personal history of radiation therapy 2006   BREAST CA   Stroke [facility])    Ulcer     Past Surgical History:  Procedure Laterality Date   BREAST BIOPSY Left 2006   POS   BREAST LUMPECTOMY Left 09/20/2004   positive   BREAST SURGERY Left     malignant biopsy   CARDIAC CATHETERIZATION     COLONOSCOPY  2015   COLONOSCOPY WITH PROPOFOL N/A 06/08/2017   Procedure: COLONOSCOPY WITH PROPOFOL;  Surgeon: Lin Landsman, MD;  Location: [facility];  Service: Gastroenterology;  Laterality: N/A;   COLONOSCOPY WITH PROPOFOL N/A 03/08/2019   Procedure: COLONOSCOPY WITH PROPOFOL;  Surgeon: Lin Landsman, MD;  Location: [facility] ENDOSCOPY;  Service: Gastroenterology;  Laterality: N/A;   COLONOSCOPY WITH PROPOFOL N/A 07/16/2020   Procedure: COLONOSCOPY WITH PROPOFOL;  Surgeon: Lin Landsman, MD;  Location: [facility] ENDOSCOPY;  Service: Gastroenterology;  Laterality: N/A;   ESOPHAGOGASTRODUODENOSCOPY  2015   ESOPHAGOGASTRODUODENOSCOPY (EGD) WITH PROPOFOL N/A 06/08/2017   Procedure: ESOPHAGOGASTRODUODENOSCOPY (EGD) WITH PROPOFOL;  Surgeon: Lin Landsman, MD;  Location: [facility];  Service: Gastroenterology;  Laterality: N/A;   ESOPHAGOGASTRODUODENOSCOPY (EGD) WITH PROPOFOL N/A 03/08/2019   Procedure: ESOPHAGOGASTRODUODENOSCOPY (EGD) WITH PROPOFOL;  Surgeon: Lin Landsman, MD;  Location: [facility] ENDOSCOPY;  Service: Gastroenterology;  Laterality: N/A;   FINGER SURGERY     Right small finger   FRACTURE SURGERY     GIVENS CAPSULE STUDY  2015   TUBAL LIGATION  There were no vitals filed for this visit.   Subjective Assessment - 07/20/21 1119     Subjective Patient reports 1/10 pain in the left lateral proximal thigh upon arrival. State PT is helping but sometimes she still has "moments" and she takes some pain medication. States she did some lateral glides eariler today and it helped her. She states she felt good after last PT session.    Pertinent History Patient is a 65 y.o. female who presents to outpatient physical therapy with a referral for medical diagnosis osseous stenosis of neural canal of lumbar region, chronic left-sided low back pain with left-sided sciatica, chronic left hip pain, pain of left  sacroiliac joint. This patient's chief complaints consist of left buttock pain with radiation into L LE to foot and up to low back leading to the following functional deficits: difficulty with usual activities such as walking, sitting, changing positions, bed mobility, household and community mobility, stairs, lifting, moving L LE, house cleaning, shopping cart, sleeping, going to school, exercising. .  Relevant past medical history and comorbidities include COVID19 with 2 PE found in August 2022 (on eliquis), shortness of breath, history of CVA (2008, affects left side mostly LE), HTN, osteoporosis, Crohn's disease (currently gets infusions), osteopenia, glaucoma, cataract removal, hx of depression (weaning medications with MD supervision), breast cancer (2006), clotting disorder, hypothyroidism, cardiac catheterization. Patient denies hx of seizures, lung problem, diabetes, unexplained weight loss, changes in bowel or bladder problems, new onset stumbling or dropping things apart from history (L leg feels like it gives out more since leg pain), history of spinal surgery.    Limitations Lifting;Standing;Walking;House hold activities   walking, sitting, changing positions, bed mobility, household and community mobility, stairs, lifting, moving L LE, house cleaning, shopping cart, sleeping, going to school, exercising.   Diagnostic tests Lumbar MRI report 11/02/2020: "IMPRESSION:  Lumbar spondylosis as outlined.  At L4-L5, there is mild/moderate disc degeneration. Grade 1  anterolisthesis. Posterior annular fissure. Disc uncovering with  disc bulge. Moderate facet arthrosis with mild ligamentum flavum  hypertrophy. Trace right facet joint effusion. Bilateral neural  foraminal narrowing (moderate right, mild left).  No more than mild spinal canal or neural foraminal narrowing at the  remaining levels.  Edema and enhancement within the right L4 and L5 pedicles/articular  pillars, which may be degenerative or may  reflect stress reaction.  Degenerative marrow edema and enhancement along the right L4-L5  facet joint."    Chest CT report 05/05/2021: "IMPRESSION:  Small filling defects are noted in upper and lower lobe branches of  the left pulmonary artery consistent with acute pulmonary emboli.  Critical Value/emergent results were called by telephone at the time  of interpretation on 05/05/2021 at 3:08 pm to provider Lavonia Drafts  , who verbally acknowledged these results.  Aortic Atherosclerosis (ICD10-I70.0)."    Patient Stated Goals want to be able to move around without hurting so much    Currently in Pain? Yes    Pain Score 1     Pain Onset More than a month ago              TREATMENT:   denies sensitivity to latex   Manual therapy: to reduce pain and tissue tension, improve range of motion, neuromodulation, in order to promote improved ability to complete functional activities. SUPINE  - LAD through left hip, 4x30 seconds   Therapeutic exercise: to centralize symptoms and improve ROM, strength, muscular endurance, and activity tolerance required for successful completion of functional activities.   -  left side glide at wall (R side toward wall), 2x20 (walking in between demo improvement).  (Manual therapy - see above).  - prone press up 2x10 - hooklying reverse curl, 3x5 with 10 second hold. Cuing to engage transverse abdominis. Metronome to help with counting speed.   - prone press up 2x10 - supine sciatic nerve glide, 1x15 left side (states she feels this one pulling).  - Quadruped bird dog (alternating shoulder flexion/contralateral hip  extension with core muscles braced).  Cuing for abdominal brace. 2x10 each side with 5 second hold with metronome. Added half foam roll/foam roll over lower back for feedback.   - prone press up 1x10 - buttocks taps on 18.5 inch plinth while holding 6k med ball in front of body. Cuing for abdominal bracing and core stability. 3x10 - standing pallof  press, 2x10e ach side at 5/10# cable - prone press up 2x10   Pt required multimodal cuing for proper technique and to facilitate improved neuromuscular control, strength, range of motion, and functional ability resulting in improved performance and form.     HOME EXERCISE PROGRAM Access Code: [email or web address] URL: [email or web address] Date: 07/13/2021 Prepared by: Rosita Kea   Exercises Seated Posture with Lumbar Roll Right Standing Lateral Shift Correction at Wall - Repetitions - 3 x daily - 1 sets - 20 reps - or when pain is present Prone Press Up - 4 x daily - 10-15 reps - 1 second hold Supine 90/90 Sciatic Nerve Glide with Knee Flexion/Extension - 1-2 x daily - 15 reps Hooklying Transversus Abdominis Palpation - 1 x daily - 1 sets - 20 reps - 5 seconds hold Reverse Curl - 1 x daily - 1-2 sets - 10 reps - 5 seconds hold Bridge with Hip Abduction and Resistance - Ground Touches - 1 x daily - 1 sets - 20 reps - 5 seconds hold Bird Dog - 1 x daily - 2-3 sets - 10 reps - 1 circle hold     PT Education - 07/20/21 1121     Education Details Exercise purpose/form. Self management techniques    Person(s) Educated Patient    Methods Explanation;Demonstration;Tactile cues;Verbal cues    Comprehension Verbalized understanding;Returned demonstration;Verbal cues required;Tactile cues required;Need further instruction              PT Short Term Goals - 07/06/21 0944       PT SHORT TERM GOAL #1   Title Be independent with initial home exercise program for self-management of symptoms.    Baseline Initial HEP provided at IE (06/17/2021);    Time 2    Period Weeks    Status Achieved    Target Date 07/01/21               PT Long Term Goals - 06/17/21 1507       PT LONG TERM GOAL #1   Title Be independent with a long-term home exercise program for self-management of symptoms.    Baseline Initial HEP provided at IE (06/17/2021);    Time 12    Period Weeks    Status  New   TARGET DATE FOR ALL LONG TERM GOALS: 09/09/2021     PT LONG TERM GOAL #2   Title Demonstrate improved FOTO score to equal or greater than 50 by visit #12 to demonstrate improvement in overall condition and self-reported functional ability.    Baseline 40 (06/17/2021);    Time 12    Period Weeks    Status New  PT LONG TERM GOAL #3   Title Have full lumbar AROM with no compensations or increase in pain in all planes except intermittent end range discomfort to allow patient to complete valued activities with less difficulty.    Baseline painful and limited - see objective exam (06/17/2021);    Time 12    Period Weeks    Status New      PT LONG TERM GOAL #4   Title Reduce pain with functional activities to equal or less than 2/10 to allow patient to complete usual activities including ADLs, IADLs, and social engagement with less difficulty.    Baseline up to 10/10 (06/17/2021);    Time 12    Period Weeks    Status New      PT LONG TERM GOAL #5   Title Complete community, work and/or recreational activities without limitation due to current condition.    Baseline Functional Limitations: walking, sitting, changing positions, bed mobility, household and community mobility, stairs, lifting, moving L LE, house cleaning, shopping cart, sleeping, going to school, exercising.    Time 12    Period Weeks    Status New      Additional Long Term Goals   Additional Long Term Goals Yes      PT LONG TERM GOAL #6   Title Patient will ambulate equal or greater than 1000 feet with no AD on 6 Minute Walk Test to demonstrate improved household and community mobility.    Baseline unable to tolerate test - difficulty walking ~ 50 feet from/to waiting room with [facility] (06/17/2021);    Time 12    Period Weeks    Status New                   Plan - 07/20/21 1149     Clinical Impression Statement [name] continues to tolerate advances in exercises and demonstrates improvements visit to  visit. Appears to respond well to side glide and extension specific exercises. Patient not ambulating with antalgic gait today but continues to report mild symptoms. Patient would benefit from continued management of limiting condition by skilled physical therapist to address remaining impairments and functional limitations to work towards stated goals and return to PLOF or maximal functional independence.    Personal Factors and Comorbidities Age;Past/Current Experience;Comorbidity 3+;Fitness;Time since onset of injury/illness/exacerbation    Comorbidities Relevant past medical history and comorbidities include COVID19 with 2 PE found in August 2022 (on eliquis), shortness of breath, history of CVA (2008, affects left side mostly LE), HTN, osteoporosis, Crohn's disease (currently gets infusions), osteopenia, glaucoma, cataract removal, hx of depression (weaning medications with MD supervision), breast cancer (2006), clotting disorder, hypothyroidism, cardiac catheterization.    Examination-Activity Limitations Bed Mobility;Bathing;Hygiene/Grooming;Squat;Stairs;Lift;Bend;Locomotion Level;Stand;Caring for Others;Carry;Transfers;Sit;Dressing;Sleep    Examination-Participation Restrictions Laundry;Cleaning;Shop;Community Activity;Meal Prep;Yard Work;Interpersonal Relationship   walking, sitting, changing positions, bed mobility, household and community mobility, stairs, lifting, moving L LE, house cleaning, shopping cart, sleeping, going to school, exercising.   Stability/Clinical Decision Making Stable/Uncomplicated    Rehab Potential Good    PT Frequency 2x / week    PT Duration 12 weeks    PT Treatment/Interventions ADLs/Self Care Home Management;Aquatic Therapy;Biofeedback;Cryotherapy;Moist Heat;Traction;Electrical Stimulation;DME Instruction;Gait training;Stair training;Functional mobility training;Neuromuscular re-education;Balance training;Therapeutic exercise;Therapeutic activities;Patient/family  education;Dry needling;Passive range of motion;Manual techniques;Spinal Manipulations    PT Next Visit Plan update HEP as appropriate, manual therapy as needed, trunk and LE strenghtening and stretching as tolerated,    PT Home Exercise Plan [facility] Access Code: [email or web address]    Consulted and  Agree with Plan of Care Patient             Patient will benefit from skilled therapeutic intervention in order to improve the following deficits and impairments:  Abnormal gait, Decreased knowledge of use of DME, Impaired sensation, Improper body mechanics, Pain, Cardiopulmonary status limiting activity, Decreased mobility, Postural dysfunction, Increased muscle spasms, Decreased activity tolerance, Decreased endurance, Decreased range of motion, Decreased strength, Hypomobility, Impaired perceived functional ability, Decreased balance, Difficulty walking, Impaired flexibility  Visit Diagnosis: Chronic left-sided low back pain with left-sided sciatica  Difficulty in walking, not elsewhere classified  Muscle weakness (generalized)  Unsteadiness on feet     Problem List Patient Active Problem List   Diagnosis Date Noted   Low back pain radiating to left leg 06/17/2021   Glaucoma 02/23/2021   Other long term (current) drug therapy 01/22/2021   Varicose veins of both lower extremities with pain 07/19/2018   Arthritis of right hand 12/21/2017   Immunosuppressed status ([place]) 12/21/2017   Major depression in remission ([place]) 12/21/2017   Fibrocystic breast changes 06/22/2017   Osteopenia 10/18/2016   Atherosclerosis of aorta ([place]) 12/19/2015   Vitreous degeneration 05/21/2015   Glaucoma associated with chamber angle anomalies 05/21/2015   Crohn's disease ([place]) 05/21/2015   H/O malignant neoplasm of breast 05/21/2015   Crohn's disease of both small and large intestine with rectal bleeding ([place]) 12/04/2014   Solitary pulmonary nodule 11/07/2012   Dysarthria as late effect of cerebrovascular  disease 04/22/2010   Cognitive deficits as late effect of cerebrovascular disease 01/20/2010   CVA, old, hemiparesis ([place]) 04/28/2009   Hypothyroidism 06/12/2008   Acid reflux 05/23/2007   Hypercholesterolemia 01/18/2007   Benign essential HTN 01/18/2007    Clarise Cruz R. Graylon Good, PT, DPT 07/20/21, 11:55 AM   [facility] [address], [place], [address] Phone: [phone]   Fax:  [phone]  Name: Carolyn Campbell MRN: [id number] Date of Birth: Apr 26, 1956

## 2021-07-22 ENCOUNTER — Encounter: Payer: Medicare Other | Admitting: Physical Therapy

## 2021-07-27 ENCOUNTER — Ambulatory Visit: Payer: Medicare Other | Attending: Physical Medicine & Rehabilitation | Admitting: Physical Therapy

## 2021-07-27 ENCOUNTER — Encounter: Payer: Self-pay | Admitting: Physical Therapy

## 2021-07-27 DIAGNOSIS — M6281 Muscle weakness (generalized): Secondary | ICD-10-CM | POA: Diagnosis present

## 2021-07-27 DIAGNOSIS — G8929 Other chronic pain: Secondary | ICD-10-CM | POA: Insufficient documentation

## 2021-07-27 DIAGNOSIS — R2681 Unsteadiness on feet: Secondary | ICD-10-CM | POA: Insufficient documentation

## 2021-07-27 DIAGNOSIS — R262 Difficulty in walking, not elsewhere classified: Secondary | ICD-10-CM | POA: Diagnosis present

## 2021-07-27 DIAGNOSIS — M5442 Lumbago with sciatica, left side: Secondary | ICD-10-CM | POA: Insufficient documentation

## 2021-07-27 NOTE — Therapy (Signed)
[facility] [address], [place], [address] Phone: [phone]   Fax:  [phone]  Physical Therapy Treatment  Patient Details  Name: Carolyn Campbell MRN: [id number] Date of Birth: 1956-02-14 Referring Provider (PT): Girtha Hake, MD   Encounter Date: 07/27/2021   PT End of Session - 07/27/21 1123     Visit Number 9    Number of Visits 24    Date for PT Re-Evaluation 09/09/21    Authorization Type [employer] MEDICARE reporting period from 06/17/2021    Progress Note Due on Visit 10    PT Start Time 1119    PT Stop Time 1159    PT Time Calculation (min) 40 min    Activity Tolerance Patient tolerated treatment well    Behavior During Therapy [facility] for tasks assessed/performed             Past Medical History:  Diagnosis Date   Abdominal pain, epigastric    Allergic rhinitis    Anginal pain ([place])    states MD said it was GERD   Arthritis    Asthma    Breast cancer ([place]) 2006   LT LUMPECTOMY   CD (Crohn's disease) ([place]) 05/21/2015   FOLLOWED BY GI    Clotting disorder ([place])    Cognitive deficits as late effect of cerebrovascular disease    COPD (chronic obstructive pulmonary disease) ([place])    Crohn's disease of both small and large intestine with rectal bleeding ([place]) 12/04/2014   Depression    Currently taking zoloft.   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    Dyspnea    Esophageal reflux    Fever blister 07/19/2018   Glaucoma    vitreous degeneration   History of kidney stones    Hyperlipidemia    Hypertension    Hypothyroidism    Occlusion, cerebral artery    NOS w/infarction   Osteoporosis    Personal history of radiation therapy 2006   BREAST CA   Stroke [facility])    Ulcer     Past Surgical History:  Procedure Laterality Date   BREAST BIOPSY Left 2006   POS   BREAST LUMPECTOMY Left 09/20/2004   positive   BREAST SURGERY Left     malignant biopsy   CARDIAC CATHETERIZATION     COLONOSCOPY  2015   COLONOSCOPY WITH PROPOFOL N/A 06/08/2017   Procedure: COLONOSCOPY WITH PROPOFOL;  Surgeon: Lin Landsman, MD;  Location: [place];  Service: Gastroenterology;  Laterality: N/A;   COLONOSCOPY WITH PROPOFOL N/A 03/08/2019   Procedure: COLONOSCOPY WITH PROPOFOL;  Surgeon: Lin Landsman, MD;  Location: [facility] ENDOSCOPY;  Service: Gastroenterology;  Laterality: N/A;   COLONOSCOPY WITH PROPOFOL N/A 07/16/2020   Procedure: COLONOSCOPY WITH PROPOFOL;  Surgeon: Lin Landsman, MD;  Location: [facility] ENDOSCOPY;  Service: Gastroenterology;  Laterality: N/A;   ESOPHAGOGASTRODUODENOSCOPY  2015   ESOPHAGOGASTRODUODENOSCOPY (EGD) WITH PROPOFOL N/A 06/08/2017   Procedure: ESOPHAGOGASTRODUODENOSCOPY (EGD) WITH PROPOFOL;  Surgeon: Lin Landsman, MD;  Location: [place];  Service: Gastroenterology;  Laterality: N/A;   ESOPHAGOGASTRODUODENOSCOPY (EGD) WITH PROPOFOL N/A 03/08/2019   Procedure: ESOPHAGOGASTRODUODENOSCOPY (EGD) WITH PROPOFOL;  Surgeon: Lin Landsman, MD;  Location: [facility] ENDOSCOPY;  Service: Gastroenterology;  Laterality: N/A;   FINGER SURGERY     Right small finger   FRACTURE SURGERY     GIVENS CAPSULE STUDY  2015   TUBAL LIGATION  There were no vitals filed for this visit.   Subjective Assessment - 07/27/21 1121     Subjective Patient reports her back/leg pain continues to be better. She reports 1/10 pain at the left lateral hip and proximal thigh upon arrival. States she is tired from a birthday party for her husband on Saturday. States when she gets this tired she gets heart flutter when she gets this tired and her SpO2 was a bit low today so she took her inhaler. She felt okay after last PT session. States she is able to walk a lot further than before and no longer must use a cane but it is still hard for her to lift heavy things like a pack of water.    Pertinent History Patient  is a 65 y.o. 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 This patient's chief complaints consist of left buttock pain with radiation into L LE to foot and up to low back leading to the following functional deficits: difficulty with usual activities such as walking, sitting, changing positions, bed mobility, household and community mobility, stairs, lifting, moving L LE, house cleaning, shopping cart, sleeping, going to school, exercising. .  Relevant past medical history and comorbidities include COVID19 with 2 PE found in August 2022 (on eliquis), shortness of breath, history of CVA (2008, affects left side mostly LE), HTN, osteoporosis, Crohn's disease (currently gets infusions), osteopenia, glaucoma, cataract removal, hx of depression (weaning medications with MD supervision), breast cancer (2006), clotting disorder, hypothyroidism, cardiac catheterization. Patient denies hx of seizures, lung problem, diabetes, unexplained weight loss, changes in bowel or bladder problems, new onset stumbling or dropping things apart from history (L leg feels like it gives out more since leg pain), history of spinal surgery.    Limitations Lifting;Standing;Walking;House hold activities   walking, sitting, changing positions, bed mobility, household and community mobility, stairs, lifting, moving L LE, house cleaning, shopping cart, sleeping, going to school, exercising.   Diagnostic tests Lumbar MRI report 11/02/2020: "IMPRESSION:  Lumbar spondylosis as outlined.  At L4-L5, there is mild/moderate disc degeneration. Grade 1  anterolisthesis. Posterior annular fissure. Disc uncovering with  disc bulge. Moderate facet arthrosis with mild ligamentum flavum  hypertrophy. Trace right facet joint effusion. Bilateral neural   foraminal narrowing (moderate right, mild left).  No more than mild spinal canal or neural foraminal narrowing at the  remaining levels.  Edema and enhancement within the right L4 and L5 pedicles/articular  pillars, which may be degenerative or may reflect stress reaction.  Degenerative marrow edema and enhancement along the right L4-L5  facet joint."    Chest CT report 05/05/2021: "IMPRESSION:  Small filling defects are noted in upper and lower lobe branches of  the left pulmonary artery consistent with acute pulmonary emboli.  Critical Value/emergent results were called by telephone at the time  of interpretation on 05/05/2021 at 3:08 pm to provider Lavonia Drafts  , who verbally acknowledged these results.  Aortic Atherosclerosis (ICD10-I70.0)."    Patient Stated Goals want to be able to move around without hurting so much    Currently in Pain? Yes    Pain Score 1     Pain Onset More than a month ago             OBJECTIVE Vitals measured throughout session HR 98 - 135 -  bpm SpO2 96 - 98% on RA  TREATMENT:   denies sensitivity to latex   Manual therapy: to reduce pain and tissue tension, improve range of motion, neuromodulation, in order to promote improved ability to complete functional activities. SUPINE  - LAD through left hip, 4x30 seconds   Therapeutic exercise: to centralize symptoms and improve ROM, strength, muscular endurance, and activity tolerance required for successful completion of functional activities.   - left side glide at wall (R side toward wall), 3x20 (improvement in pain).  (Manual therapy - see above).  - prone press up 2x10 - hooklying reverse curl, 2x10 with 10 second hold. Cuing to engage transverse abdominis. Metronome to help with counting speed. - supine sciatic nerve glide, 1x15 left side (does not pull as much as it used to).   - prone press up 2x10 - Quadruped bird dog (alternating shoulder flexion/contralateral hip  extension with core muscles braced).   Cuing for abdominal brace. 2x10 each side with 5 second hold with metronome. Added half foam roll/foam roll over lower back for feedback. 5# AW each ankle.   - prone press up 1x10 - buttocks taps on 18.5 inch plinth while holding a 10# DB in front of body. Cuing for abdominal bracing and core stability, and for knees towards toes. 3x10 - prone press up 2x10   Pt required multimodal cuing for proper technique and to facilitate improved neuromuscular control, strength, range of motion, and functional ability resulting in improved performance and form.     HOME EXERCISE PROGRAM Access Code: [email or web address] URL: [email or web address] Date: 07/13/2021 Prepared by: Rosita Kea   Exercises Seated Posture with Lumbar Roll Right Standing Lateral Shift Correction at Wall - Repetitions - 3 x daily - 1 sets - 20 reps - or when pain is present Prone Press Up - 4 x daily - 10-15 reps - 1 second hold Supine 90/90 Sciatic Nerve Glide with Knee Flexion/Extension - 1-2 x daily - 15 reps Hooklying Transversus Abdominis Palpation - 1 x daily - 1 sets - 20 reps - 5 seconds hold Reverse Curl - 1 x daily - 1-2 sets - 10 reps - 5 seconds hold Bridge with Hip Abduction and Resistance - Ground Touches - 1 x daily - 1 sets - 20 reps - 5 seconds hold Bird Dog - 1 x daily - 2-3 sets - 10 reps - 1 circle hold      PT Education - 07/27/21 1123     Education Details Exercise purpose/form. Self management techniques    Person(s) Educated Patient    Methods Explanation;Demonstration;Tactile cues;Verbal cues    Comprehension Verbalized understanding;Returned demonstration;Verbal cues required;Tactile cues required;Need further instruction              PT Short Term Goals - 07/06/21 0944       PT SHORT TERM GOAL #1   Title Be independent with initial home exercise program for self-management of symptoms.    Baseline Initial HEP provided at IE (06/17/2021);    Time 2    Period Weeks    Status  Achieved    Target Date 07/01/21               PT Long Term Goals - 06/17/21 1507       PT LONG TERM GOAL #1   Title Be independent with a long-term home exercise program for self-management of symptoms.    Baseline Initial HEP provided at IE (06/17/2021);    Time 12    Period Weeks  Status New   TARGET DATE FOR ALL LONG TERM GOALS: 09/09/2021     PT LONG TERM GOAL #2   Title Demonstrate improved FOTO score to equal or greater than 50 by visit #12 to demonstrate improvement in overall condition and self-reported functional ability.    Baseline 40 (06/17/2021);    Time 12    Period Weeks    Status New      PT LONG TERM GOAL #3   Title Have full lumbar AROM with no compensations or increase in pain in all planes except intermittent end range discomfort to allow patient to complete valued activities with less difficulty.    Baseline painful and limited - see objective exam (06/17/2021);    Time 12    Period Weeks    Status New      PT LONG TERM GOAL #4   Title Reduce pain with functional activities to equal or less than 2/10 to allow patient to complete usual activities including ADLs, IADLs, and social engagement with less difficulty.    Baseline up to 10/10 (06/17/2021);    Time 12    Period Weeks    Status New      PT LONG TERM GOAL #5   Title Complete community, work and/or recreational activities without limitation due to current condition.    Baseline Functional Limitations: walking, sitting, changing positions, bed mobility, household and community mobility, stairs, lifting, moving L LE, house cleaning, shopping cart, sleeping, going to school, exercising.    Time 12    Period Weeks    Status New      Additional Long Term Goals   Additional Long Term Goals Yes      PT LONG TERM GOAL #6   Title Patient will ambulate equal or greater than 1000 feet with no AD on 6 Minute Walk Test to demonstrate improved household and community mobility.    Baseline unable to  tolerate test - difficulty walking ~ 50 feet from/to waiting room with [facility] (06/17/2021);    Time 12    Period Weeks    Status New                   Plan - 07/27/21 1146     Clinical Impression Statement Patient tolerated treatment well today including her vitals remaining WFL with no feeling of heart palpitations. Patient was able to continue progressing core and hip strengthening exercises this session without worsening of symptoms. Patient has not yet returned to [facility] and continues to have functional limitations such as lifting heavy things (like a pack of water) due to condition. Patient would benefit from continued management of limiting condition by skilled physical therapist to address remaining impairments and functional limitations to work towards stated goals and return to PLOF or maximal functional independence.    Personal Factors and Comorbidities Age;Past/Current Experience;Comorbidity 3+;Fitness;Time since onset of injury/illness/exacerbation    Comorbidities Relevant past medical history and comorbidities include COVID19 with 2 PE found in August 2022 (on eliquis), shortness of breath, history of CVA (2008, affects left side mostly LE), HTN, osteoporosis, Crohn's disease (currently gets infusions), osteopenia, glaucoma, cataract removal, hx of depression (weaning medications with MD supervision), breast cancer (2006), clotting disorder, hypothyroidism, cardiac catheterization.    Examination-Activity Limitations Bed Mobility;Bathing;Hygiene/Grooming;Squat;Stairs;Lift;Bend;Locomotion Level;Stand;Caring for Others;Carry;Transfers;Sit;Dressing;Sleep    Examination-Participation Restrictions Laundry;Cleaning;Shop;Community Activity;Meal Prep;Yard Work;Interpersonal Relationship   walking, sitting, changing positions, bed mobility, household and community mobility, stairs, lifting, moving L LE, house cleaning, shopping cart, sleeping, going to school, exercising.  Stability/Clinical  Decision Making Stable/Uncomplicated    Rehab Potential Good    PT Frequency 2x / week    PT Duration 12 weeks    PT Treatment/Interventions ADLs/Self Care Home Management;Aquatic Therapy;Biofeedback;Cryotherapy;Moist Heat;Traction;Electrical Stimulation;DME Instruction;Gait training;Stair training;Functional mobility training;Neuromuscular re-education;Balance training;Therapeutic exercise;Therapeutic activities;Patient/family education;Dry needling;Passive range of motion;Manual techniques;Spinal Manipulations    PT Next Visit Plan update HEP as appropriate, manual therapy as needed, trunk and LE strenghtening and stretching as tolerated,    PT Home Exercise Plan [facility] Access Code: [email or web address]    Consulted and Agree with Plan of Care Patient             Patient will benefit from skilled therapeutic intervention in order to improve the following deficits and impairments:  Abnormal gait, Decreased knowledge of use of DME, Impaired sensation, Improper body mechanics, Pain, Cardiopulmonary status limiting activity, Decreased mobility, Postural dysfunction, Increased muscle spasms, Decreased activity tolerance, Decreased endurance, Decreased range of motion, Decreased strength, Hypomobility, Impaired perceived functional ability, Decreased balance, Difficulty walking, Impaired flexibility  Visit Diagnosis: Chronic left-sided low back pain with left-sided sciatica  Difficulty in walking, not elsewhere classified  Muscle weakness (generalized)  Unsteadiness on feet     Problem List Patient Active Problem List   Diagnosis Date Noted   Low back pain radiating to left leg 06/17/2021   Glaucoma 02/23/2021   Other long term (current) drug therapy 01/22/2021   Varicose veins of both lower extremities with pain 07/19/2018   Arthritis of right hand 12/21/2017   Immunosuppressed status ([place]) 12/21/2017   Major depression in remission ([place]) 12/21/2017   Fibrocystic breast changes 06/22/2017    Osteopenia 10/18/2016   Atherosclerosis of aorta ([place]) 12/19/2015   Vitreous degeneration 05/21/2015   Glaucoma associated with chamber angle anomalies 05/21/2015   Crohn's disease ([place]) 05/21/2015   H/O malignant neoplasm of breast 05/21/2015   Crohn's disease of both small and large intestine with rectal bleeding ([place]) 12/04/2014   Solitary pulmonary nodule 11/07/2012   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 Graylon Good, PT, DPT 07/27/21, 11:56 AM   [facility] [address] [place], [address] Phone: [phone]   Fax:  [phone]  Name: Carolyn Campbell MRN: [id number] Date of Birth: Mar 05, 1956

## 2021-07-29 ENCOUNTER — Ambulatory Visit: Payer: Medicare Other | Admitting: Physical Therapy

## 2021-07-29 DIAGNOSIS — R262 Difficulty in walking, not elsewhere classified: Secondary | ICD-10-CM

## 2021-07-29 DIAGNOSIS — G8929 Other chronic pain: Secondary | ICD-10-CM

## 2021-07-29 DIAGNOSIS — M6281 Muscle weakness (generalized): Secondary | ICD-10-CM

## 2021-07-29 DIAGNOSIS — M5442 Lumbago with sciatica, left side: Secondary | ICD-10-CM | POA: Diagnosis not present

## 2021-07-29 DIAGNOSIS — R2681 Unsteadiness on feet: Secondary | ICD-10-CM

## 2021-07-29 NOTE — Therapy (Signed)
[facility] [address], [place], [address] Phone: [phone]   Fax:  [phone]  Physical Therapy Treatment / Discharge Summary Dates of reporting from 06/17/2021 - 07/29/2021  Patient Details  Name: Carolyn Campbell MRN: [id number] Date of Birth: [birth year] Referring Provider (PT): Girtha Hake, MD   Encounter Date: 07/29/2021   PT End of Session - 07/29/21 1207     Visit Number 10    Number of Visits 24    Date for PT Re-Evaluation 09/09/21    Authorization Type [employer] MEDICARE reporting period from 06/17/2021    Progress Note Due on Visit 10    PT Start Time 1120    PT Stop Time 1200    PT Time Calculation (min) 40 min    Activity Tolerance Patient tolerated treatment well    Behavior During Therapy [facility] for tasks assessed/performed             Past Medical History:  Diagnosis Date   Abdominal pain, epigastric    Allergic rhinitis    Anginal pain ([place])    states MD said it was GERD   Arthritis    Asthma    Breast cancer ([place]) 2006   LT LUMPECTOMY   CD (Crohn's disease) ([place]) 05/21/2015   FOLLOWED BY GI    Clotting disorder ([place])    Cognitive deficits as late effect of cerebrovascular disease    COPD (chronic obstructive pulmonary disease) ([place])    Crohn's disease of both small and large intestine with rectal bleeding ([place]) 12/04/2014   Depression    Currently taking zoloft.   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)    Ulcer     Past Surgical History:  Procedure Laterality Date   BREAST BIOPSY Left 2006   POS   BREAST  LUMPECTOMY Left 09/20/2004   positive   BREAST SURGERY Left    malignant biopsy   CARDIAC CATHETERIZATION     COLONOSCOPY  2015   COLONOSCOPY WITH PROPOFOL N/A 06/08/2017   Procedure: COLONOSCOPY WITH PROPOFOL;  Surgeon: Lin Landsman, MD;  Location: [place];  Service: Gastroenterology;  Laterality: N/A;   COLONOSCOPY WITH PROPOFOL N/A 03/08/2019   Procedure: COLONOSCOPY WITH PROPOFOL;  Surgeon: Lin Landsman, MD;  Location: [facility] ENDOSCOPY;  Service: Gastroenterology;  Laterality: N/A;   COLONOSCOPY WITH PROPOFOL N/A 07/16/2020   Procedure: COLONOSCOPY WITH PROPOFOL;  Surgeon: Lin Landsman, MD;  Location: [facility] ENDOSCOPY;  Service: Gastroenterology;  Laterality: N/A;   ESOPHAGOGASTRODUODENOSCOPY  2015   ESOPHAGOGASTRODUODENOSCOPY (EGD) WITH PROPOFOL N/A 06/08/2017   Procedure: ESOPHAGOGASTRODUODENOSCOPY (EGD) WITH PROPOFOL;  Surgeon: Lin Landsman, MD;  Location: [place];  Service: Gastroenterology;  Laterality: N/A;   ESOPHAGOGASTRODUODENOSCOPY (EGD) WITH PROPOFOL N/A 03/08/2019   Procedure: ESOPHAGOGASTRODUODENOSCOPY (EGD) WITH PROPOFOL;  Surgeon: Lin Landsman, MD;  Location: [facility] ENDOSCOPY;  Service: Gastroenterology;  Laterality: N/A;   FINGER SURGERY     Right small finger   FRACTURE SURGERY     GIVENS CAPSULE STUDY  2015   TUBAL LIGATION      There were no vitals filed for this visit.   Subjective Assessment - 07/29/21 1122     Subjective Patient reports she is feeling well and has no pain upon arrival. She states she feels ready to discharge from PT. She has no question about her HEP and states they are going well. She was a little sore after last PT session. reports pain gets up to 1/10 with her usual activities. States she can do all of her functional activities now without limitation from her leg or back pain.    Pertinent History Patient is a 65 y.o. female who presents to outpatient physical therapy with a referral for medical  diagnosis osseous stenosis of neural canal of lumbar region, chronic left-sided low back pain with left-sided sciatica, chronic left hip pain, pain of left sacroiliac joint. This patient's chief complaints consist of left buttock pain with radiation into L LE to foot and up to low back leading to the following functional deficits: difficulty with usual activities such as walking, sitting, changing positions, bed mobility, household and community mobility, stairs, lifting, moving L LE, house cleaning, shopping cart, sleeping, going to school, exercising. .  Relevant past medical history and comorbidities include COVID19 with 2 PE found in August 2022 (on eliquis), shortness of breath, history of CVA (2008, affects left side mostly LE), HTN, osteoporosis, Crohn's disease (currently gets infusions), osteopenia, glaucoma, cataract removal, hx of depression (weaning medications with MD supervision), breast cancer (2006), clotting disorder, hypothyroidism, cardiac catheterization. Patient denies hx of seizures, lung problem, diabetes, unexplained weight loss, changes in bowel or bladder problems, new onset stumbling or dropping things apart from history (L leg feels like it gives out more since leg pain), history of spinal surgery.    Limitations Lifting;Standing;Walking;House hold activities   walking, sitting, changing positions, bed mobility, household and community mobility, stairs, lifting, moving L LE, house cleaning, shopping cart, sleeping, going to school, exercising.   Diagnostic tests Lumbar MRI report 11/02/2020: "IMPRESSION:  Lumbar spondylosis as outlined.  At L4-L5, there is mild/moderate disc degeneration. Grade 1  anterolisthesis. Posterior annular fissure. Disc uncovering with  disc bulge. Moderate facet arthrosis with mild ligamentum flavum  hypertrophy. Trace right facet joint effusion. Bilateral neural  foraminal narrowing (moderate right, mild left).  No more than mild spinal canal or neural  foraminal narrowing at the  remaining levels.  Edema and enhancement within the right L4 and L5 pedicles/articular  pillars, which may be degenerative or may reflect stress reaction.  Degenerative marrow edema and enhancement along the right L4-L5  facet joint."    Chest CT report 05/05/2021: "IMPRESSION:  Small filling defects are noted in upper and lower lobe branches of  the left pulmonary artery consistent with acute pulmonary emboli.  Critical Value/emergent results were called by telephone at the time  of interpretation on 05/05/2021 at 3:08 pm to provider Lavonia Drafts  , who verbally acknowledged these results.  Aortic Atherosclerosis (ICD10-I70.0)."    Patient Stated Goals want to be able to move around without hurting so much    Currently in Pain? No/denies    Pain Onset More than a month ago            OBJECTIVE  SELF-REPORTED FUNCTION FOTO score: 75/100 (lumbar spine questionnaire)   SPINE MOTION Lumbar Spine AROM *Indicates pain Flexion: fingers to ankles, no pain  Extension: 100% feels good.  Side Flexion:        R  finger to distal patella, no pain       L finger to distal patella, no pain Rotation (hips stablized by PT):  R 30% no pain L 30% no pain  FUNCTIONAL/BALANCE TESTS 6 Minute Walk Test: 1462 feet with no AD   TREATMENT:   denies sensitivity to latex   Therapeutic exercise: to centralize symptoms and improve ROM, strength, muscular endurance, and activity tolerance required for successful completion of functional activities.   - left side glide at wall (R side toward wall), 3x20 - measurements to assess progress (see above).  - prone press up 2x10 - ambulation around room for distance in 6 minutes with no AD.  - prone press up 1x10 - hooklying reverse curl, 1x10 with 10 second hold. Cuing to engage transverse abdominis. Metronome to help with counting speed. - supine sciatic nerve glide, 1x15 left side (does not pull as much as it used to).   - prone  press up 1x10 - buttocks taps on 18.5 inch plinth while holding a 10# DB in front of body. Cuing for abdominal bracing and core stability, and for knees towards toes. 1x10 - prone press up 2x10   Pt required multimodal cuing for proper technique and to facilitate improved neuromuscular control, strength, range of motion, and functional ability resulting in improved performance and form.     HOME EXERCISE PROGRAM Access Code: [email or web address] URL: [email or web address] Date: 07/13/2021 Prepared by: Rosita Kea   Exercises Seated Posture with Lumbar Roll Right Standing Lateral Shift Correction at Wall - Repetitions - 3 x daily - 1 sets - 20 reps - or when pain is present Prone Press Up - 4 x daily - 10-15 reps - 1 second hold Supine 90/90 Sciatic Nerve Glide with Knee Flexion/Extension - 1-2 x daily - 15 reps Hooklying Transversus Abdominis Palpation - 1 x daily - 1 sets - 20 reps - 5 seconds hold Reverse Curl - 1 x daily - 1-2 sets - 10 reps - 5 seconds hold Bridge with Hip Abduction and Resistance - Ground Touches - 1 x daily - 1 sets - 20 reps - 5 seconds hold Bird Dog - 1 x daily - 2-3 sets - 10 reps - 1 circle hold      PT Education - 07/29/21 1206     Education Details Exercise purpose/form. Self management techniques. discharge reccomendations    Person(s) Educated Patient    Methods Explanation;Demonstration;Tactile cues;Verbal cues    Comprehension Verbalized understanding;Returned demonstration              PT Short Term Goals - 07/06/21 0944       PT SHORT TERM GOAL #1   Title Be independent with initial home exercise program for self-management of symptoms.    Baseline Initial HEP provided at IE (06/17/2021);    Time 2    Period Weeks    Status Achieved    Target Date 07/01/21               PT Long Term Goals - 07/29/21 1157       PT LONG TERM GOAL #1   Title Be independent with a long-term home exercise program for self-management of  symptoms.    Baseline Initial HEP provided at IE (06/17/2021);    Time 12    Period Weeks    Status Achieved   TARGET DATE FOR ALL LONG TERM GOALS: 09/09/2021     PT LONG TERM GOAL #2   Title Demonstrate improved FOTO score to  equal or greater than 50 by visit #12 to demonstrate improvement in overall condition and self-reported functional ability.    Baseline 40 (06/17/2021); 75 at visit #10 (07/29/2021);    Time 12    Period Weeks    Status Achieved      PT LONG TERM GOAL #3   Title Have full lumbar AROM with no compensations or increase in pain in all planes except intermittent end range discomfort to allow patient to complete valued activities with less difficulty.    Baseline painful and limited - see objective exam (06/17/2021);    Time 12    Period Weeks    Status Achieved      PT LONG TERM GOAL #4   Title Reduce pain with functional activities to equal or less than 2/10 to allow patient to complete usual activities including ADLs, IADLs, and social engagement with less difficulty.    Baseline up to 10/10 (06/17/2021); up to 1/10 (07/29/2021);    Time 12    Period Weeks    Status Achieved      PT LONG TERM GOAL #5   Title Complete community, work and/or recreational activities without limitation due to current condition.    Baseline Functional Limitations: walking, sitting, changing positions, bed mobility, household and community mobility, stairs, lifting, moving L LE, house cleaning, shopping cart, sleeping, going to school, exercising.    Time 12    Period Weeks    Status Achieved      PT LONG TERM GOAL #6   Title Patient will ambulate equal or greater than 1000 feet with no AD on 6 Minute Walk Test to demonstrate improved household and community mobility.    Baseline unable to tolerate test - difficulty walking ~ 50 feet from/to waiting room with [facility] (06/17/2021); 1462 feet with no AD or increase in sympotms (07/28/2021);    Time 12    Period Weeks    Status Achieved                    Plan - 07/29/21 1200     Clinical Impression Statement Patient has attended 10 physical therapy sessions this episode of care and has met all goals. She is therefore discharged to long term HEP for independent management.    Personal Factors and Comorbidities Age;Past/Current Experience;Comorbidity 3+;Fitness;Time since onset of injury/illness/exacerbation    Comorbidities Relevant past medical history and comorbidities include COVID19 with 2 PE found in August 2022 (on eliquis), shortness of breath, history of CVA (2008, affects left side mostly LE), HTN, osteoporosis, Crohn's disease (currently gets infusions), osteopenia, glaucoma, cataract removal, hx of depression (weaning medications with MD supervision), breast cancer (2006), clotting disorder, hypothyroidism, cardiac catheterization.    Examination-Activity Limitations Bed Mobility;Bathing;Hygiene/Grooming;Squat;Stairs;Lift;Bend;Locomotion Level;Stand;Caring for Others;Carry;Transfers;Sit;Dressing;Sleep    Examination-Participation Restrictions Laundry;Cleaning;Shop;Community Activity;Meal Prep;Yard Work;Interpersonal Relationship   walking, sitting, changing positions, bed mobility, household and community mobility, stairs, lifting, moving L LE, house cleaning, shopping cart, sleeping, going to school, exercising.   Stability/Clinical Decision Making Stable/Uncomplicated    Rehab Potential Good    PT Frequency 2x / week    PT Duration 12 weeks    PT Treatment/Interventions ADLs/Self Care Home Management;Aquatic Therapy;Biofeedback;Cryotherapy;Moist Heat;Traction;Electrical Stimulation;DME Instruction;Gait training;Stair training;Functional mobility training;Neuromuscular re-education;Balance training;Therapeutic exercise;Therapeutic activities;Patient/family education;Dry needling;Passive range of motion;Manual techniques;Spinal Manipulations    PT Next Visit Plan patient is now discharged from PT due to improvement in  symptoms.    PT Home Exercise Plan [place] Access Code: [email or web address]    Consulted and Agree  with Plan of Care Patient             Patient will benefit from skilled therapeutic intervention in order to improve the following deficits and impairments:  Abnormal gait, Decreased knowledge of use of DME, Impaired sensation, Improper body mechanics, Pain, Cardiopulmonary status limiting activity, Decreased mobility, Postural dysfunction, Increased muscle spasms, Decreased activity tolerance, Decreased endurance, Decreased range of motion, Decreased strength, Hypomobility, Impaired perceived functional ability, Decreased balance, Difficulty walking, Impaired flexibility  Visit Diagnosis: Chronic left-sided low back pain with left-sided sciatica  Difficulty in walking, not elsewhere classified  Muscle weakness (generalized)  Unsteadiness on feet     Problem List Patient Active Problem List   Diagnosis Date Noted   Low back pain radiating to left leg 06/17/2021   Glaucoma 02/23/2021   Other long term (current) drug therapy 01/22/2021   Varicose veins of both lower extremities with pain 07/19/2018   Arthritis of right hand 12/21/2017   Immunosuppressed status ([place]) 12/21/2017   Major depression in remission ([place]) 12/21/2017   Fibrocystic breast changes 06/22/2017   Osteopenia 10/18/2016   Atherosclerosis of aorta ([place]) 12/19/2015   Vitreous degeneration 05/21/2015   Glaucoma associated with chamber angle anomalies 05/21/2015   Crohn's disease ([place]) 05/21/2015   H/O malignant neoplasm of breast 05/21/2015   Crohn's disease of both small and large intestine with rectal bleeding ([place]) 12/04/2014   Solitary pulmonary nodule 11/07/2012   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 Graylon Good, PT, DPT 07/29/21, 12:07 PM   [facility] [address], [place], [address] Phone: [phone]   Fax:  [phone]  Name: ANMOL PASCHEN MRN: [id number] Date of Birth: 07/17/1956

## 2021-08-04 ENCOUNTER — Other Ambulatory Visit: Payer: Self-pay

## 2021-08-04 ENCOUNTER — Ambulatory Visit (INDEPENDENT_AMBULATORY_CARE_PROVIDER_SITE_OTHER): Payer: Medicare Other | Admitting: Family Medicine

## 2021-08-04 ENCOUNTER — Encounter: Payer: Self-pay | Admitting: Family Medicine

## 2021-08-04 ENCOUNTER — Ambulatory Visit: Payer: Medicare Other | Admitting: Nurse Practitioner

## 2021-08-04 VITALS — BP 124/64 | HR 85 | Temp 98.5°F | Resp 16 | Ht 60.0 in | Wt 151.4 lb

## 2021-08-04 DIAGNOSIS — M545 Low back pain, unspecified: Secondary | ICD-10-CM

## 2021-08-04 DIAGNOSIS — I2699 Other pulmonary embolism without acute cor pulmonale: Secondary | ICD-10-CM

## 2021-08-04 DIAGNOSIS — E038 Other specified hypothyroidism: Secondary | ICD-10-CM | POA: Diagnosis not present

## 2021-08-04 DIAGNOSIS — F325 Major depressive disorder, single episode, in full remission: Secondary | ICD-10-CM

## 2021-08-04 DIAGNOSIS — I10 Essential (primary) hypertension: Secondary | ICD-10-CM | POA: Diagnosis not present

## 2021-08-04 DIAGNOSIS — M79605 Pain in left leg: Secondary | ICD-10-CM

## 2021-08-04 DIAGNOSIS — Z23 Encounter for immunization: Secondary | ICD-10-CM

## 2021-08-04 MED ORDER — DULOXETINE HCL 30 MG PO CPEP: 30.0000 mg | ORAL_CAPSULE | Freq: Every day | ORAL | 1 refills | Status: DC

## 2021-08-04 NOTE — Assessment & Plan Note (Signed)
Doing well on current regimen, no changes made today. 

## 2021-08-04 NOTE — Patient Instructions (Signed)
It was great to see you!  Our plans for today:  - Let us know if you want to see the hematologist to see about coming off your eliquis - we are increasing your cymbalta dose.  - come back in 2 months.  We are checking some labs today, we will release these results to your MyChart.  Take care and seek immediate care sooner if you develop any concerns.   Dr. Ky Barban

## 2021-08-04 NOTE — Assessment & Plan Note (Signed)
Recheck labs 

## 2021-08-04 NOTE — Assessment & Plan Note (Signed)
Maintains on Eliquis therapy, now 3 months out from provoked PE. Offered hematology referral to determine duration of therapy given somewhat sedentary status due to back pain but patient declines. If pain improves with increased SNRI and becomes more mobile, can consider discontinuation.

## 2021-08-04 NOTE — Assessment & Plan Note (Signed)
Chronic, stable, without recent injury or falls. No red flags to warrant emergent imaging. Per PM&R notes, considering steroid injection in SIJ and L4-5 not previously given, patient does not want further injections. On low dose duloxetine, reasonable to trial dose increase given been on for at least a month, no improvement in back pain and depression, rx sent. F/u in 2 months.

## 2021-08-04 NOTE — Assessment & Plan Note (Signed)
Increase duloxetine.

## 2021-08-04 NOTE — Progress Notes (Signed)
   SUBJECTIVE:   CHIEF COMPLAINT / HPI:   BACK PAIN Duration:  chronic Mechanism of injury: no trauma Location: L lower back Quality: sharp, stabbing Frequency: constant Radiation: L leg below the knee Aggravating factors: walking and prolonged sitting Alleviating factors: tylenol Status: better Treatments attempted: tylenol, lyrica, PT, cymbalta (recently started by neuro) Relief with NSAIDs?: No NSAIDs Taken Nighttime pain:  yes Paresthesias / decreased sensation:  no, but subjective weakness.  Bowel / bladder incontinence:  no Fevers:  no  H/o PE - about 2 weeks after having COVID 04/2021 - at least 3 month duration due to provoked PE, question regarding longer duration due to somewhat sedentary lifestyle due to back pain. Cardiology considering heme coagulable w/u. - wants to come off eliquis but declines heme referral.  Hypertension, HLD, h/o stroke: - Medications: losartan-HCTZ, crestor - Compliance: taking crestor few times per week - mild sleep apnea, no CPAP needed  Depression - Medications: cymbalta (recently changed from zoloft) - Taking: good compliance - Symptoms: feeling down  - Current stressors: lots of medications and doctors   Depression screen [facility] 2/9 08/04/2021 06/17/2021 06/17/2021  Decreased Interest 0 0 0  Down, Depressed, Hopeless 1 0 0  PHQ - 2 Score 1 0 0  Altered sleeping 2 - 0  Tired, decreased energy 1 - 0  Change in appetite 1 - 0  Feeling bad or failure about yourself  0 - 0  Trouble concentrating 0 - 0  Moving slowly or fidgety/restless 1 - 0  Suicidal thoughts 0 - 0  PHQ-9 Score 6 - 0  Difficult doing work/chores Not difficult at all - Not difficult at all  Some recent data might be hidden     OBJECTIVE:   BP 124/64   Pulse 85   Temp 98.5 F (36.9 C)   Resp 16   Ht 5' (1.524 m)   Wt 151 lb 6.4 oz (68.7 kg)   SpO2 99%   BMI 29.57 kg/m   Gen: well appearing, in NAD Ext: WWP, no edema MSK: normal gait.     ASSESSMENT/PLAN:   Benign essential HTN Doing well on current regimen, no changes made today.  Hypothyroidism Recheck labs.  Low back pain radiating to left leg Chronic, stable, without recent injury or falls. No red flags to warrant emergent imaging. Per PM&R notes, considering steroid injection in SIJ and L4-5 not previously given, patient does not want further injections. On low dose duloxetine, reasonable to trial dose increase given been on for at least a month, no improvement in back pain and depression, rx sent. F/u in 2 months.  Major depression in remission (H[facility]) Increase duloxetine.   PE (pulmonary thromboembolism) ([employer]) Maintains on Eliquis therapy, now 3 months out from provoked PE. Offered hematology referral to determine duration of therapy given somewhat sedentary status due to back pain but patient declines. If pain improves with increased SNRI and becomes more mobile, can consider discontinuation.      Myles Gip, DO

## 2021-08-05 ENCOUNTER — Telehealth: Payer: Self-pay | Admitting: Nurse Practitioner

## 2021-08-05 LAB — TSH: TSH: 1.05 mIU/L (ref 0.40–4.50)

## 2021-08-05 NOTE — Telephone Encounter (Signed)
Called to confirm appt with berge 11-17  Patient hesitant to keep appt stating she is having a hard time getting questions answered from office when she calls.   Patient doesn't see the need in the appt as she had to push out surgery ( because clearance not given)  Patient also states she doesn't see the need in taking blood thinner anymore because her clot has resolved ( she is only taking one a day now and states she planned to stop taking)   Advised patient important to keep appt and discuss above with provider   Patient agreed to come

## 2021-08-05 NOTE — Telephone Encounter (Signed)
Noted. Will forward to Ignacia Bayley, NP/ Jeannene Patella, RN as an [place] for tomorrow's appointment.   See 06/09/21 & 06/12/21 phone notes as well.

## 2021-08-06 ENCOUNTER — Telehealth: Payer: Self-pay | Admitting: Nurse Practitioner

## 2021-08-06 ENCOUNTER — Encounter: Payer: Self-pay | Admitting: Nurse Practitioner

## 2021-08-06 ENCOUNTER — Ambulatory Visit (INDEPENDENT_AMBULATORY_CARE_PROVIDER_SITE_OTHER): Payer: Medicare Other | Admitting: Nurse Practitioner

## 2021-08-06 ENCOUNTER — Other Ambulatory Visit: Payer: Self-pay

## 2021-08-06 VITALS — BP 180/78 | HR 86 | Ht 60.0 in | Wt 151.0 lb

## 2021-08-06 DIAGNOSIS — I2699 Other pulmonary embolism without acute cor pulmonale: Secondary | ICD-10-CM

## 2021-08-06 DIAGNOSIS — R072 Precordial pain: Secondary | ICD-10-CM | POA: Diagnosis not present

## 2021-08-06 DIAGNOSIS — I2510 Atherosclerotic heart disease of native coronary artery without angina pectoris: Secondary | ICD-10-CM | POA: Insufficient documentation

## 2021-08-06 DIAGNOSIS — I10 Essential (primary) hypertension: Secondary | ICD-10-CM

## 2021-08-06 DIAGNOSIS — I25110 Atherosclerotic heart disease of native coronary artery with unstable angina pectoris: Secondary | ICD-10-CM

## 2021-08-06 DIAGNOSIS — E785 Hyperlipidemia, unspecified: Secondary | ICD-10-CM

## 2021-08-06 MED ORDER — METOPROLOL TARTRATE 100 MG PO TABS: 100.0000 mg | ORAL_TABLET | Freq: Once | ORAL | 0 refills | Status: DC

## 2021-08-06 MED ORDER — APIXABAN 5 MG PO TABS: ORAL_TABLET | ORAL | 2 refills | Status: DC

## 2021-08-06 MED ORDER — LOSARTAN POTASSIUM-HCTZ 50-12.5 MG PO TABS: 1.0000 | ORAL_TABLET | Freq: Every day | ORAL | 3 refills | Status: DC

## 2021-08-06 NOTE — Patient Instructions (Addendum)
Medication Instructions:  Your physician has recommended you make the following change in your medication:   Take whole tablet of your losartan-hydrochlorothiazide 50-12.5 mg once a day  Metoprolol you will take 2 hours prior to your test.   *If you need a refill on your cardiac medications before your next appointment, please call your pharmacy*   Lab Work: BMET in one week here in our office.   If you have labs (blood work) drawn today and your tests are completely normal, you will receive your results only by: [place] (if you have MyChart) OR A paper copy in the mail If you have any lab test that is abnormal or we need to change your treatment, we will call you to review the results.   Testing/Procedures:   Your cardiac CT will be scheduled at the below location:   [facility] [address] [place], [place] [address] [phone]    If scheduled at [facility], please arrive 15 mins early for check-in and test prep.   On the Night Before the Test: Be sure to Drink plenty of water. Do not consume any caffeinated/decaffeinated beverages or chocolate 12 hours prior to your test. Do not take any antihistamines 12 hours prior to your test.   On the Day of the Test: Drink plenty of water until 1 hour prior to the test. Do not eat any food 4 hours prior to the test. You may take your regular medications prior to the test.  Take metoprolol (Lopressor) 100 mg two hours prior to test. HOLD Losartan/Hydrochlorothiazide the morning of the test. FEMALES- please wear underwire-free bra if available, avoid dresses & tight clothing        After the Test: Drink plenty of water. After receiving IV contrast, you may experience a mild flushed feeling. This is normal. On occasion, you may experience a mild rash up to 24 hours after the test. This is not dangerous. If this occurs, you can take Benadryl 25  mg and increase your fluid intake. If you experience trouble breathing, this can be serious. If it is severe call 911 IMMEDIATELY. If it is mild, please call our office. If you take any of these medications: Glipizide/Metformin, Avandament, Glucavance, please do not take 48 hours after completing test unless otherwise instructed.  Please allow 2-4 weeks for scheduling of routine cardiac CTs. Some insurance companies require a pre-authorization which may delay scheduling of this test.   For non-scheduling related questions, please contact the cardiac imaging nurse navigator should you have any questions/concerns: Marchia Bond, Cardiac Imaging Nurse Navigator Gordy Clement, Cardiac Imaging Nurse Navigator [facility] Direct Office Dial: [phone]   For scheduling needs, including cancellations and rescheduling, please call [place], [phone].    Follow-Up: At [facility], you and your health needs are our priority.  As part of our continuing mission to provide you with exceptional heart care, we have created designated Provider Care Teams.  These Care Teams include your primary Cardiologist (physician) and Advanced Practice Providers (APPs -  Physician Assistants and Nurse Practitioners) who all work together to provide you with the care you need, when you need it.   Your next appointment:   1 month(s)  The format for your next appointment:   In Person  Provider:   Ida Rogue, MD or Murray Hodgkins, NP

## 2021-08-06 NOTE — Progress Notes (Signed)
Office Visit    Patient Name: Carolyn Campbell Date of Encounter: 08/06/2021  Primary Care Provider:  Delsa Grana, PA-C Primary Cardiologist:  Ida Rogue, MD  Chief Complaint    65 year old female with a history of hypertension, hyperlipidemia, nonobstructive CAD, stroke, PE in the context of COVID-19 infection in August 2022, COPD, and Crohn's disease, who presents for follow-up related to unstable angina.  Past Medical History    Past Medical History:  Diagnosis Date   Abdominal pain, epigastric    Allergic rhinitis    Arthritis    Asthma    Breast cancer ([place]) 2006   LT LUMPECTOMY   Clotting disorder ([place])    Cognitive deficits as late effect of cerebrovascular disease    COPD (chronic obstructive pulmonary disease) ([place])    COVID-19 virus infection 04/2021   Crohn's disease of both small and large intestine with rectal bleeding ([place]) 12/04/2014   Depression    Currently taking zoloft.   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 05/2021 Echo: EF 60-65%, no rwma, nl RV fxn.   History of kidney stones    Hyperlipidemia    Hypertension    Hypothyroidism    Nonobstructive CAD (coronary artery disease)    a. 10/2012 Cath: diffuse minor irregs-->med rx.   Occlusion, cerebral artery    NOS w/infarction   Osteoporosis    Personal history of radiation therapy 2006   BREAST CA   Pulmonary embolism ([place])    a.04/2021 CTA Chest: Small filling defects are noted in upper and lower lobe branches of L PA-->acute PE-->eliquis. *PE occurred in context of COVID infxn.   Stroke [facility])    Ulcer    Past Surgical History:  Procedure Laterality Date   BREAST BIOPSY Left 2006   POS   BREAST LUMPECTOMY Left 09/20/2004   positive   BREAST SURGERY Left    malignant biopsy   CARDIAC CATHETERIZATION      COLONOSCOPY  2015   COLONOSCOPY WITH PROPOFOL N/A 06/08/2017   Procedure: COLONOSCOPY WITH PROPOFOL;  Surgeon: Lin Landsman, MD;  Location: [facility];  Service: Gastroenterology;  Laterality: N/A;   COLONOSCOPY WITH PROPOFOL N/A 03/08/2019   Procedure: COLONOSCOPY WITH PROPOFOL;  Surgeon: Lin Landsman, MD;  Location: [facility] ENDOSCOPY;  Service: Gastroenterology;  Laterality: N/A;   COLONOSCOPY WITH PROPOFOL N/A 07/16/2020   Procedure: COLONOSCOPY WITH PROPOFOL;  Surgeon: Lin Landsman, MD;  Location[facility] ENDOSCOPY;  Service: Gastroenterology;  Laterality: N/A;   ESOPHAGOGASTRODUODENOSCOPY  2015   ESOPHAGOGASTRODUODENOSCOPY (EGD) WITH PROPOFOL N/A 06/08/2017   Procedure: ESOPHAGOGASTRODUODENOSCOPY (EGD) WITH PROPOFOL;  Surgeon: Lin Landsman, MD;  Location: [place];  Service: Gastroenterology;  Laterality: N/A;   ESOPHAGOGASTRODUODENOSCOPY (EGD) WITH PROPOFOL N/A 03/08/2019   Procedure: ESOPHAGOGASTRODUODENOSCOPY (EGD) WITH PROPOFOL;  Surgeon: Lin Landsman, MD;  Location: [facility] ENDOSCOPY;  Service: Gastroenterology;  Laterality: N/A;   FINGER SURGERY     Right small finger   FRACTURE SURGERY     GIVENS CAPSULE STUDY  2015   TUBAL LIGATION      Allergies  No Known Allergies  History of Present Illness    65 year old female with a history of hypertension, hyperlipidemia, nonobstructive CAD, stroke, PE in the context of COVID-19 infection in August 2022, COPD, and  Crohn's disease.  She previously underwent cardiac work-up with abnormal stress test in 2014, followed by diagnostic catheterization revealing minimal diffuse, minor irregularities.  She has been medically managed since.  She has chronic dyspnea on exertion and in August of this year, she was diagnosed with COVID-19.  Dyspnea seem to worsen following diagnosis and she was seen in the emergency department on August 16 with a CTA of the chest showing small filling defects in the upper and  lower lobe branches of the left pulmonary artery consistent with acute PE.  Aortic atherosclerosis was also noted.  She was treated with Eliquis.  She followed-up in cardiology clinic in September and echocardiogram was performed showing an EF of 60 to 65% with no regional wall motion abnormalities, and normal RV function.  Carolyn Campbell notes that since her last visit, she has been having exertional substernal chest tightness and dyspnea.  Symptoms occur multiple times per week, last 5 to 10 minutes, and resolve with rest.  She has been working with rehabilitation and has noted improvement in strength, but sometimes exercise is limited by chest tightness and dyspnea.  She also notes that over the past 2 weeks, she has been taking Eliquis just once a day.  She did not think that she needed to take more than that.  She denies palpitations, PND, orthopnea, dizziness, syncope, edema, or early satiety.  Home Medications    Current Outpatient Medications  Medication Sig Dispense Refill   albuterol (VENTOLIN HFA) 108 (90 Base) MCG/ACT inhaler Inhale 2 puffs into the lungs every 4 (four) hours as needed for wheezing or shortness of breath. 8 g 0   DULoxetine (CYMBALTA) 30 MG capsule Take 1 capsule (30 mg total) by mouth daily. 90 capsule 1   latanoprost (XALATAN) 0.005 % ophthalmic solution Place 1 drop into both eyes at bedtime.      levothyroxine (SYNTHROID) 50 MCG tablet Take 1 tablet (50 mcg total) by mouth daily before breakfast. On empty stomach 90 tablet 3   metoprolol tartrate (LOPRESSOR) 100 MG tablet Take 1 tablet (100 mg total) by mouth once for 1 dose. Take 2 hours prior to your test. 1 tablet 0   Multiple Vitamin (MULTIVITAMIN) tablet Take 1 tablet by mouth daily.     pregabalin (LYRICA) 25 MG capsule Take 25 mg by mouth 3 (three) times daily.     rosuvastatin (CRESTOR) 20 MG tablet Take 1 tablet (20 mg total) by mouth at bedtime. 90 tablet 3   triamcinolone ointment (KENALOG) 0.1 % Apply to  affected skin one to two times daily for up to two weeks 30 g 0   valACYclovir (VALTREX) 1000 MG tablet Take 1 tablet (1,000 mg total) by mouth 2 (two) times daily. 30 tablet 5   vedolizumab (ENTYVIO) 300 MG injection Infuse every 8 weeks. Patient is okay to restart the Entyvio 1 each 5   apixaban (ELIQUIS) 5 MG TABS tablet 1 tablet (42m) twice daily 90 tablet 2   losartan-hydrochlorothiazide (HYZAAR) 50-12.5 MG tablet Take 1 tablet by mouth daily. 90 tablet 3   omeprazole (PRILOSEC) 20 MG capsule Take 1 capsule (20 mg total) by mouth daily. (Patient not taking: Reported on 05/22/2021) 90 capsule 0   No current facility-administered medications for this visit.     Review of Systems    Exertional chest pain and dyspnea as outlined above.  She denies palpitations, PND, orthopnea, dizziness, syncope, edema, or early satiety.  All other systems reviewed and are otherwise negative except as  noted above.    Physical Exam    VS:  BP (!) 180/78 (BP Location: Left Arm, Patient Position: Sitting, Cuff Size: Normal)   Pulse 86   Ht 5' (1.524 m)   Wt 151 lb (68.5 kg)   SpO2 96%   BMI 29.49 kg/m  , BMI Body mass index is 29.49 kg/m.     GEN: Well nourished, well developed, in no acute distress. HEENT: normal. Neck: Supple, no JVD, carotid bruits, or masses. Cardiac: RRR, no murmurs, rubs, or gallops. No clubbing, cyanosis, trace left ankle edema.  Radials/PT 2+ and equal bilaterally.  Respiratory:  Respirations regular and unlabored, clear to auscultation bilaterally. GI: Soft, nontender, nondistended, BS + x 4. MS: no deformity or atrophy. Skin: warm and dry, no rash. Neuro:  Strength and sensation are intact. Psych: Normal affect.  Accessory Clinical Findings    ECG personally reviewed by me today -regular sinus rhythm, 86- no acute changes.  Lab Results  Component Value Date   WBC 6.4 07/01/2021   HGB 12.8 07/01/2021   HCT 38.3 07/01/2021   MCV 90.5 07/01/2021   PLT 205 07/01/2021    Lab Results  Component Value Date   CREATININE 0.75 07/01/2021   BUN 9 07/01/2021   NA 139 07/01/2021   K 3.7 07/01/2021   CL 98 07/01/2021   CO2 29 07/01/2021   Lab Results  Component Value Date   ALT 20 01/29/2021   AST 20 01/29/2021   ALKPHOS 102 10/17/2019   BILITOT 0.5 01/29/2021   Lab Results  Component Value Date   CHOL 193 01/29/2021   HDL 89 01/29/2021   LDLCALC 85 01/29/2021   TRIG 91 01/29/2021   CHOLHDL 2.2 01/29/2021     Assessment & Plan    1.  Unstable angina/nonobstructive CAD: Patient with prior catheterization in 2014 showing diffuse minor irregularities.  She was diagnosed with pulmonary embolism in August and has been on Eliquis, though over the past 2 weeks, she has only been taking this daily (see below).  Over the past few months, she has been having exertional substernal chest tightness and dyspnea, multiple days a week, lasting a few minutes, and resolving with rest.  I will arrange for coronary CT angiogram to assess for any progression of CAD.  Continue rosuvastatin therapy.  No aspirin in the setting of Eliquis.  2.  Pulmonary embolism: Diagnosed in August following COVID infection.  She has been treated with Eliquis, which is prescribed 5 mg twice daily however, over the past 2 weeks, she is only been taking his daily.  We discussed the importance of medication compliance with Eliquis and I encouraged her to take it as prescribed-5 mg twice daily.  Was previously outlined that she would remain on Eliquis for 3 months and therefore, this can likely be discontinued soon.  As she has been working with rehab and has been more active recently, she likely will not require DVT prophylactic dosing Eliquis going forward.  3.  Essential hypertension: Blood pressure elevated 180/78.  I have asked her to take 1 whole tablet of losartan-HCTZ (currently taking half a tablet).  4.  Hyperlipidemia: LDL of 85 in May with normal LFTs.  Continue statin therapy.  5.   Disposition: Follow-up basic metabolic panel in 1 week.  Follow-up coronary CT angiography.  Follow-up in clinic in 1 month or sooner if necessary.  Murray Hodgkins, NP 08/06/2021, 1:50 PM

## 2021-08-06 NOTE — Telephone Encounter (Signed)
Called to schedule test with patient and she reported someone had already been in touch. She is scheduled for 08/20/21 and had no further questions at this time.

## 2021-08-12 ENCOUNTER — Other Ambulatory Visit (INDEPENDENT_AMBULATORY_CARE_PROVIDER_SITE_OTHER): Payer: Medicare Other

## 2021-08-12 ENCOUNTER — Other Ambulatory Visit: Payer: Self-pay

## 2021-08-12 DIAGNOSIS — I25110 Atherosclerotic heart disease of native coronary artery with unstable angina pectoris: Secondary | ICD-10-CM

## 2021-08-12 DIAGNOSIS — R072 Precordial pain: Secondary | ICD-10-CM

## 2021-08-12 DIAGNOSIS — I10 Essential (primary) hypertension: Secondary | ICD-10-CM

## 2021-08-13 LAB — BASIC METABOLIC PANEL
BUN/Creatinine Ratio: 11 — ABNORMAL LOW (ref 12–28)
BUN: 10 mg/dL (ref 8–27)
CO2: 26 mmol/L (ref 20–29)
Calcium: 10.2 mg/dL (ref 8.7–10.3)
Chloride: 102 mmol/L (ref 96–106)
Creatinine, Ser: 0.93 mg/dL (ref 0.57–1.00)
Glucose: 108 mg/dL — ABNORMAL HIGH (ref 70–99)
Potassium: 3.9 mmol/L (ref 3.5–5.2)
Sodium: 144 mmol/L (ref 134–144)
eGFR: 67 mL/min/{1.73_m2} (ref >59)

## 2021-08-19 ENCOUNTER — Telehealth (HOSPITAL_COMMUNITY): Payer: Self-pay | Admitting: *Deleted

## 2021-08-19 NOTE — Telephone Encounter (Signed)
Reaching out to patient to offer assistance regarding upcoming cardiac imaging study; pt verbalizes understanding of appt date/time, parking situation and where to check in, pre-test NPO status and medications ordered, and verified current allergies; name and call back number provided for further questions should they arise ° °Lakeishia Truluck RN Navigator Cardiac Imaging °[facility] °[phone] office °[phone] cell  ° °Patient to take 100mg metoprolol tartrate two hours prior to cardiac CT scan. °

## 2021-08-20 ENCOUNTER — Other Ambulatory Visit: Payer: Self-pay

## 2021-08-20 ENCOUNTER — Ambulatory Visit
Admission: RE | Admit: 2021-08-20 | Discharge: 2021-08-20 | Disposition: A | Discharge status: Home / Self Care | Payer: Medicare Other | Source: Ambulatory Visit | Attending: Nurse Practitioner | Admitting: Nurse Practitioner

## 2021-08-20 DIAGNOSIS — R072 Precordial pain: Secondary | ICD-10-CM | POA: Diagnosis not present

## 2021-08-20 MED ORDER — NITROGLYCERIN 0.4 MG SL SUBL
0.8000 mg | SUBLINGUAL_TABLET | Freq: Once | SUBLINGUAL | Status: AC
  Administered 2021-08-20: 0.8 mg via SUBLINGUAL

## 2021-08-20 MED ORDER — IOHEXOL 350 MG/ML SOLN
75.0000 mL | Freq: Once | INTRAVENOUS | Status: AC | PRN
  Administered 2021-08-20: 75 mL via INTRAVENOUS

## 2021-08-20 NOTE — Progress Notes (Signed)
Patient tolerated CT well. Drank water after. Vital signs stable encourage to drink water throughout day.Reasons explained and verbalized understanding.  

## 2021-08-24 ENCOUNTER — Telehealth: Payer: Self-pay | Admitting: *Deleted

## 2021-08-24 DIAGNOSIS — I700 Atherosclerosis of aorta: Secondary | ICD-10-CM

## 2021-08-24 DIAGNOSIS — E785 Hyperlipidemia, unspecified: Secondary | ICD-10-CM

## 2021-08-24 DIAGNOSIS — I25110 Atherosclerotic heart disease of native coronary artery with unstable angina pectoris: Secondary | ICD-10-CM

## 2021-08-24 MED ORDER — ROSUVASTATIN CALCIUM 40 MG PO TABS: 40.0000 mg | ORAL_TABLET | Freq: Every day | ORAL | 3 refills | Status: DC

## 2021-08-24 NOTE — Telephone Encounter (Signed)
-----   Message from Theora Gianotti, NP sent at 08/21/2021  4:30 PM EST ----- No significant coronary artery stenoses, though a mild amt of plaque was noted in the LAD and LCX.  No significant non-cardiac findings.  This is overall a reassuring study, though I'd like to be a little more aggressive with her cholesterol mgmt in order to stabilize these plaques going forward.  Please have her increase rosuvastatin to 42m daily.  F/u lipids/lfts in 6-8 wks.

## 2021-08-24 NOTE — Telephone Encounter (Signed)
Reviewed lab results and recommendations with patient. Instructed her to increase dosage on her rosuvastatin and new prescription sent into her pharmacy. Reviewed request for repeat fasting lipid/lft to be done and scheduled her to come in on 10/19/21 to have that done. She did request I send her My Chart message regarding labs and appointment information. She verbalized understanding of our conversation, agreement with plan, and had no further questions at this time.

## 2021-08-28 ENCOUNTER — Encounter: Payer: Self-pay | Admitting: Nurse Practitioner

## 2021-08-28 ENCOUNTER — Ambulatory Visit (INDEPENDENT_AMBULATORY_CARE_PROVIDER_SITE_OTHER): Payer: Medicare Other | Admitting: Nurse Practitioner

## 2021-08-28 ENCOUNTER — Ambulatory Visit (INDEPENDENT_AMBULATORY_CARE_PROVIDER_SITE_OTHER): Payer: Medicare Other

## 2021-08-28 ENCOUNTER — Other Ambulatory Visit: Payer: Self-pay

## 2021-08-28 VITALS — BP 182/82 | HR 95 | Ht 60.0 in | Wt 152.0 lb

## 2021-08-28 DIAGNOSIS — I10 Essential (primary) hypertension: Secondary | ICD-10-CM

## 2021-08-28 DIAGNOSIS — I25118 Atherosclerotic heart disease of native coronary artery with other forms of angina pectoris: Secondary | ICD-10-CM

## 2021-08-28 DIAGNOSIS — R0602 Shortness of breath: Secondary | ICD-10-CM | POA: Diagnosis not present

## 2021-08-28 DIAGNOSIS — I25110 Atherosclerotic heart disease of native coronary artery with unstable angina pectoris: Secondary | ICD-10-CM | POA: Diagnosis not present

## 2021-08-28 DIAGNOSIS — R002 Palpitations: Secondary | ICD-10-CM

## 2021-08-28 DIAGNOSIS — I2699 Other pulmonary embolism without acute cor pulmonale: Secondary | ICD-10-CM

## 2021-08-28 DIAGNOSIS — E785 Hyperlipidemia, unspecified: Secondary | ICD-10-CM

## 2021-08-28 MED ORDER — CARVEDILOL 6.25 MG PO TABS: 6.2500 mg | ORAL_TABLET | Freq: Two times a day (BID) | ORAL | 3 refills | Status: DC

## 2021-08-28 MED ORDER — CARVEDILOL 6.25 MG PO TABS
6.2500 mg | ORAL_TABLET | Freq: Two times a day (BID) | ORAL | 0 refills | Status: DC
Start: 2021-08-28 — End: 2021-10-15

## 2021-08-28 NOTE — Progress Notes (Signed)
Office Visit    Patient Name: Carolyn Campbell Date of Encounter: 08/28/2021  Primary Care Provider:  Delsa Grana, PA-C Primary Cardiologist:  Ida Rogue, MD  Chief Complaint    65 year old female with a history of hypertension, hyperlipidemia, nonobstructive CAD, stroke, PE in the context of COVID-19 infection August 2022, COPD, and Crohn's disease, presents for follow-up related to unstable angina and recent nonobstructive findings on coronary CTA.  Past Medical History    Past Medical History:  Diagnosis Date   Abdominal pain, epigastric    Allergic rhinitis    Arthritis    Asthma    Breast cancer ([place]) 2006   LT LUMPECTOMY   Clotting disorder ([place])    Cognitive deficits as late effect of cerebrovascular disease    COPD (chronic obstructive pulmonary disease) ([place])    COVID-19 virus infection 04/2021   Crohn's disease of both small and large intestine with rectal bleeding ([place]) 12/04/2014   Depression    Currently taking zoloft.   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 05/2021 Echo: EF 60-65%, no rwma, nl RV fxn.   History of kidney stones    Hyperlipidemia    Hypertension    Hypothyroidism    Nonobstructive CAD (coronary artery disease)    a. 10/2012 Cath: diffuse minor irregs-->med rx; b. 08/2021 Cor CTA: Ca2+ = 55.6 (77th%'ile), LAD 25p, LCX <25p. No signif non-cardiac findings.   Occlusion, cerebral artery    NOS w/infarction   Osteoporosis    Personal history of radiation therapy 2006   BREAST CA   Pulmonary embolism ([place])    a.04/2021 CTA Chest: Small filling defects are noted in upper and lower lobe branches of L PA-->acute PE-->eliquis. *PE occurred in context of COVID infxn.   Stroke [facility])    Ulcer    Past Surgical History:  Procedure Laterality Date   BREAST BIOPSY  Left 2006   POS   BREAST LUMPECTOMY Left 09/20/2004   positive   BREAST SURGERY Left    malignant biopsy   CARDIAC CATHETERIZATION     COLONOSCOPY  2015   COLONOSCOPY WITH PROPOFOL N/A 06/08/2017   Procedure: COLONOSCOPY WITH PROPOFOL;  Surgeon: Lin Landsman, MD;  Location: [place];  Service: Gastroenterology;  Laterality: N/A;   COLONOSCOPY WITH PROPOFOL N/A 03/08/2019   Procedure: COLONOSCOPY WITH PROPOFOL;  Surgeon: Lin Landsman, MD;  Location: [facility] ENDOSCOPY;  Service: Gastroenterology;  Laterality: N/A;   COLONOSCOPY WITH PROPOFOL N/A 07/16/2020   Procedure: COLONOSCOPY WITH PROPOFOL;  Surgeon: Lin Landsman, MD;  Location: [facility] ENDOSCOPY;  Service: Gastroenterology;  Laterality: N/A;   ESOPHAGOGASTRODUODENOSCOPY  2015   ESOPHAGOGASTRODUODENOSCOPY (EGD) WITH PROPOFOL N/A 06/08/2017   Procedure: ESOPHAGOGASTRODUODENOSCOPY (EGD) WITH PROPOFOL;  Surgeon: Lin Landsman, MD;  Location: [place];  Service: Gastroenterology;  Laterality: N/A;   ESOPHAGOGASTRODUODENOSCOPY (EGD) WITH PROPOFOL N/A 03/08/2019   Procedure: ESOPHAGOGASTRODUODENOSCOPY (EGD) WITH PROPOFOL;  Surgeon: Lin Landsman, MD;  Location: [facility] ENDOSCOPY;  Service: Gastroenterology;  Laterality: N/A;   FINGER SURGERY     Right small finger   FRACTURE SURGERY     GIVENS CAPSULE STUDY  2015   TUBAL LIGATION      Allergies  No Known Allergies  History of Present Illness    65 year old female  with the above past medical history including hypertension, hyperlipidemia, nonobstructive CAD, stroke, PE in the context of COVID-19 infection August 2022, COPD, and Crohn's disease.  She previously underwent cardiac work-up with abnormal stress test 2014, followed by diagnostic catheterization revealing minimal, diffuse, minor irregularities.  She has been medically managed since.  She has chronic dyspnea exertion and in August 2022, she was diagnosed with COVID-19.  Dyspnea seemed to  worsen following diagnosis and she was seen in the Emergency Department on August 16, with a CT of the chest showing small filling defects in the upper and lower lobe branches of the left pulmonary artery consistent with acute PE.  Aortic atherosclerosis was also noted.  She was treated with Eliquis.  She followed up in cardiology clinic in September and echocardiogram was performed showing an EF of 60 to 65% with no regional wall motion abnormalities, and normal RV function.  Ms. Bermingham was last seen in cardiology clinic on November 17, at which time she continued to report dyspnea on exertion and intermittent exertional chest tightness.  She also reported that she was only taking Eliquis once a day.  She was also hypertensive and her losartan-HCTZ was increased.  She subsequently underwent coronary CT angiography which showed minimal LAD and circumflex disease and no significant noncardiac findings.  Since her last visit, she has continued to have dyspnea on exertion and intermittent chest discomfort.  She has been compliant with her antihypertensive and Eliquis therapy.  Blood pressure remains elevated today at 186/80.  She has a new complaint today involving intermittent tachypalpitations that occur at rest, become associated with dyspnea, and resolve spontaneously after seconds to minutes.  She is interested in a cardiac monitor.  Despite ongoing dyspnea on exertion, she denies PND, orthopnea, dizziness, syncope, edema, or early satiety.  Home Medications    Current Outpatient Medications  Medication Sig Dispense Refill   apixaban (ELIQUIS) 5 MG TABS tablet 1 tablet (79m) twice daily 90 tablet 2   carvedilol (COREG) 6.25 MG tablet Take 1 tablet (6.25 mg total) by mouth 2 (two) times daily. 180 tablet 3   carvedilol (COREG) 6.25 MG tablet Take 1 tablet (6.25 mg total) by mouth 2 (two) times daily. 60 tablet 0   DULoxetine (CYMBALTA) 30 MG capsule Take 1 capsule (30 mg total) by mouth daily. 90 capsule  1   latanoprost (XALATAN) 0.005 % ophthalmic solution Place 1 drop into both eyes at bedtime.      levothyroxine (SYNTHROID) 50 MCG tablet Take 1 tablet (50 mcg total) by mouth daily before breakfast. On empty stomach 90 tablet 3   losartan-hydrochlorothiazide (HYZAAR) 50-12.5 MG tablet Take 1 tablet by mouth daily. 90 tablet 3   Multiple Vitamin (MULTIVITAMIN) tablet Take 1 tablet by mouth daily.     pregabalin (LYRICA) 25 MG capsule Take 25 mg by mouth 3 (three) times daily.     rosuvastatin (CRESTOR) 40 MG tablet Take 1 tablet (40 mg total) by mouth daily. 90 tablet 3   triamcinolone ointment (KENALOG) 0.1 % Apply to affected skin one to two times daily for up to two weeks 30 g 0   valACYclovir (VALTREX) 1000 MG tablet Take 1 tablet (1,000 mg total) by mouth 2 (two) times daily. 30 tablet 5   vedolizumab (ENTYVIO) 300 MG injection Infuse every 8 weeks. Patient is okay to restart the Entyvio 1 each 5   albuterol (VENTOLIN HFA) 108 (90 Base) MCG/ACT inhaler Inhale 2 puffs into the lungs every 4 (four) hours as  needed for wheezing or shortness of breath. (Patient not taking: Reported on 08/20/2021) 8 g 0   No current facility-administered medications for this visit.     Review of Systems    Ongoing dyspnea on exertion which she says predates her COVID and PE diagnoses.  She also continues to experience intermittent chest discomfort.  She reports palpitations today.  She denies PND, orthopnea, dizziness, syncope, edema, or early satiety.  All other systems reviewed and are otherwise negative except as noted above.    Physical Exam    VS:  BP (!) 182/82   Pulse 95   Ht 5' (1.524 m)   Wt 152 lb (68.9 kg)   SpO2 98%   BMI 29.69 kg/m  , BMI Body mass index is 29.69 kg/m.     Vitals:   08/28/21 1109 08/28/21 1300  BP: (!) 186/80 (!) 182/82  Pulse: 95   SpO2: 98%     GEN: Well nourished, well developed, in no acute distress. HEENT: normal. Neck: Supple, no JVD, carotid bruits, or  masses. Cardiac: RRR, no murmurs, rubs, or gallops. No clubbing, cyanosis, edema.  Radials/PT 2+ and equal bilaterally.  Respiratory:  Respirations regular and unlabored, clear to auscultation bilaterally. GI: Soft, nontender, nondistended, BS + x 4. MS: no deformity or atrophy. Skin: warm and dry, no rash. Neuro:  Strength and sensation are intact. Psych: Normal affect.  Accessory Clinical Findings    ECG personally reviewed by me today -regular sinus rhythm, 95, left atrial enlargement, nonspecific T changes- no acute changes.  Lab Results  Component Value Date   WBC 6.4 07/01/2021   HGB 12.8 07/01/2021   HCT 38.3 07/01/2021   MCV 90.5 07/01/2021   PLT 205 07/01/2021   Lab Results  Component Value Date   CREATININE 0.93 08/12/2021   BUN 10 08/12/2021   NA 144 08/12/2021   K 3.9 08/12/2021   CL 102 08/12/2021   CO2 26 08/12/2021   Lab Results  Component Value Date   ALT 20 01/29/2021   AST 20 01/29/2021   ALKPHOS 102 10/17/2019   BILITOT 0.5 01/29/2021   Lab Results  Component Value Date   CHOL 193 01/29/2021   HDL 89 01/29/2021   LDLCALC 85 01/29/2021   TRIG 91 01/29/2021   CHOLHDL 2.2 01/29/2021     Assessment & Plan    1.  Chest pain/nonobstructive CAD: Patient with prior history of catheterization 2014 showing diffuse minor irregularities.  Over the past several months, she has been having intermittent rest and exertional chest tightness and dyspnea.  Following her last visit in November, we obtained a coronary CT angiogram which showed minimal nonobstructive CAD without significant noncardiac findings.  She has continued to have intermittent chest discomfort and dyspnea.  As previously noted, she is status post COVID infection and pulmonary embolism over the summer.  She is on Eliquis.  Other work-up earlier this year includes an echocardiogram in September 2022, which showed normal LV and RV function, without significant valvular disease.  In light of ongoing  dyspnea and recent COVID infection, I will refer to pulmonology.  No further cardiac work-up warranted at this time.  2.  Palpitations: Today, patient reports intermittent palpitations occurring at rest and associated with dyspnea.  Heart rate is 95 in sinus rhythm today.  I am placing a Zio monitor and also adding carvedilol 6.25 mg twice daily as she is also significantly hypertensive.  She had normal electrolytes and TSH in November.  3.  Essential hypertension: At her last visit, I increased her losartan-HCTZ to 50-12.5 mg daily (she had been taking half a tablet prior to that.  Blood pressure markedly elevated today at 186/80 and 182/82 on repeat.  In light of palpitations and mild elevation of resting heart rate today, adding carvedilol 6.25 mg twice daily.  4.  Hyperlipidemia: LDL of 85 in May with normal LFTs.  Given recent findings of nonobstructive CAD and aortic atherosclerosis, I intensified her statin therapy to rosuvastatin 40 mg daily.  We will plan to follow-up lipids and LFTs at her next visit.  5.  Pulmonary embolism/dyspnea on exertion: Following COVID infection in the setting of ongoing dyspnea, she was found to have filling defects in the upper and lower lobe branches of the left pulmonary artery consistent with PE.  She has been on Eliquis but for some portion of the time, she was only taking once daily.  She has been taking twice daily since our last visit on November 17.  She has had ongoing dyspnea, which she says today precluded her COVID infection or PE.  As noted above, cardiac work-up has been reassuring.  Pulmonology referral placed.  6.  History of COVID-19 infection: See #5.  With ongoing dyspnea, pulmonology referral placed.  7.  Disposition: Follow-up Zio monitoring.  Follow-up in clinic in 4 to 6 weeks with plan for fasting lipids and LFTs at that time.  Murray Hodgkins, NP 08/28/2021, 1:00 PM

## 2021-08-28 NOTE — Patient Instructions (Addendum)
Medication Instructions:  Your physician has recommended you make the following change in your medication:   START Carvedilol 6.25 mg twice a day  *If you need a refill on your cardiac medications before your next appointment, please call your pharmacy*   Lab Work: None  If you have labs (blood work) drawn today and your tests are completely normal, you will receive your results only by: [place] (if you have MyChart) OR A paper copy in the mail If you have any lab test that is abnormal or we need to change your treatment, we will call you to review the results.   Testing/Procedures: Your provider has ordered a heart monitor to wear for 14 days. This will be mailed to your home with instructions on placement. Once you have finished the time frame requested, you will return monitor in box provided.      Follow-Up: At [facility], you and your health needs are our priority.  As part of our continuing mission to provide you with exceptional heart care, we have created designated Provider Care Teams.  These Care Teams include your primary Cardiologist (physician) and Advanced Practice Providers (APPs -  Physician Assistants and Nurse Practitioners) who all work together to provide you with the care you need, when you need it.   Your next appointment:   8 week(s)  The format for your next appointment:   In Person  Provider:   Ida Rogue, MD or Murray Hodgkins, NP    Other Instructions: Referral has been placed for Pulmonary provider. They will give you a call to get that appointment scheduled. [phone]

## 2021-09-22 ENCOUNTER — Telehealth: Payer: Self-pay | Admitting: Nurse Practitioner

## 2021-09-22 NOTE — Telephone Encounter (Signed)
Theora Gianotti, NP  09/21/2021 11:33 AM EST     No significant arrhythmias on monitor.  Triggered events were mostly associated with a normal heart rhythm.  One event associated with an extra heart beat from the bottom chamber.  Overall, very reassuring findings.

## 2021-09-22 NOTE — Telephone Encounter (Signed)
I called and spoke with the patient regarding her monitor findings.  She voices understanding of her results.  She then advised she only wore the monitor for ~ 1 day due to itching/ rash on her skin (12/13-12/14). She states she can still see the rash.  I advised her to keep her appointment with Dr. Rockey Situ as scheduled on 2/7 to follow up. She then inquired if she could just cancel the appointment since the monitor looked ok.  I advised her I would like to notify the nurse practitioner of the reason for the premature discontinuation of her montior and see if how far out he feels we should push her appointment if she wanted to reschedule the one scheduled for 2/7.   The patient was agreeable to a call back with NP recommendations prior to cancelling her appointment with Dr. Rockey Situ.

## 2021-09-23 NOTE — Telephone Encounter (Signed)
Despite only wearing for 17 hrs, there were some triggered events that were not significant.  Symptoms are likely related to PVCs, which the carvedilol should help.  If she's feeling well, and palpitations seem to have improved after starting carvedilol, I think it's ok for her to cancel, but her bp was very high at last visit - so would like to know how that's been trending.  If high, she should prob keep follow-up.

## 2021-09-23 NOTE — Telephone Encounter (Signed)
Called and spoke to pt about Sharolyn Douglas, NP's recommences.   Pt reporters that she is feeling better and would like to cancel her appt.   Asked pt what he BP has been running, since it was high at her last appt. Pt stated that it is "better." Asked if she had an numbers that she could give me. Pt stated that she has not checked it in a few days, but it was ok. I asked what it was last time she checked it. Pt hesitated then stated "Um, 120/80"   Encouraged pt to keep appt so that we could make sure her BP was not still high, but pt refused and wanted to have her appt cancelled.   Appt cancelled per pt request, and pt encouraged to call us should she need anything, or have any concerns. Pt voiced understanding.

## 2021-10-13 ENCOUNTER — Institutional Professional Consult (permissible substitution): Payer: Medicare Other | Admitting: Pulmonary Disease

## 2021-10-15 ENCOUNTER — Ambulatory Visit (INDEPENDENT_AMBULATORY_CARE_PROVIDER_SITE_OTHER): Payer: Medicare Other | Admitting: Internal Medicine

## 2021-10-15 ENCOUNTER — Encounter: Payer: Self-pay | Admitting: Internal Medicine

## 2021-10-15 ENCOUNTER — Other Ambulatory Visit: Payer: Self-pay

## 2021-10-15 DIAGNOSIS — R0609 Other forms of dyspnea: Secondary | ICD-10-CM

## 2021-10-15 NOTE — Addendum Note (Signed)
Addended by: Christinia Gully B on: 10/15/2021 10:26 AM   Modules accepted: Level of Service

## 2021-10-15 NOTE — Progress Notes (Addendum)
[name], female    DOB: [birth year]    MRN: [id number]   Brief patient profile:  56   yobf   never smoker  referred to pulmonary clinic in [facility]  10/15/2021 by Murray Hodgkins NP   for sob/cough         Synopsis by Dr Lake Bells  04/24/13 Synopsis: Ms. Nola first saw the [facility] Pulmonary office in February 2014 after multiple hospitalizations and ER visits for tachypnea. She had been seen at both [facility] and [facility] and had an extensive workup which was entirely negative. This included a left heart catheterization, CT angiogram chest multiple chest x-rays, and an echocardiogram. In February 2014 when she saw [place] for the first time the [facility] clinic she was noted to be tachypneic when the examiner was in the room but this apparently subsided undergone. She was unable to complete pulmonary function testing due to her tachypnea. She was started on Symbicort empirically because she had a distant history of asthma. Her pfts 11/14/12 show a jagged sawtooth exp pattern that is uninterpretable    History of Present Illness  10/15/2021  Pulmonary/ 1st office eval/ Melvyn Novas / [place] re Jaynie Bream Albin Fischer Chief Complaint  Patient presents with   Consult  Dyspnea:  pushes shopping cart fine at store fine but uses [facility] parking lot p cva but back hurts too and more limited than doe from what I could tell/ did have cp with PE but "they say it has resolved  Q the pain has resolved ?  A no, I still have the pain coming and going same area"  Cough: variable dry x years more day than noct  Sleep: bed is flat, one pillow does ok  SABA use: "the doctors say it did not help"   ? Did you think it helped?  A they said it didn't   Pt did not complete the ROS form   Past Medical History:  Diagnosis Date   Abdominal pain, epigastric    Allergic rhinitis    Arthritis    Asthma    Breast cancer ([place]) 2006   LT LUMPECTOMY   Clotting disorder ([place])     Cognitive deficits as late effect of cerebrovascular disease    COPD (chronic obstructive pulmonary disease) ([place])    COVID-19 virus infection 04/2021   Crohn's disease of both small and large intestine with rectal bleeding ([place]) 12/04/2014   Depression    Currently taking zoloft.   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 05/2021 Echo: EF 60-65%, no rwma, nl RV fxn.   History of kidney stones    Hyperlipidemia    Hypertension    Hypothyroidism    Nonobstructive CAD (coronary artery disease)    a. 10/2012 Cath: diffuse minor irregs-->med rx; b. 08/2021 Cor CTA: Ca2+ = 55.6 (77th%'ile), LAD 25p, LCX <25p. No signif non-cardiac findings.   Occlusion, cerebral artery    NOS w/infarction   Osteoporosis    Personal history of radiation therapy 2006   BREAST CA   Pulmonary embolism ([place])    a.04/2021 CTA Chest: Small filling defects are noted in upper and lower lobe branches of L PA-->acute PE-->eliquis. *PE occurred in context of COVID infxn.   Stroke [facility])  Ulcer     Outpatient Medications Prior to Visit  Medication Sig Dispense Refill   apixaban (ELIQUIS) 5 MG TABS tablet 1 tablet (50m) twice daily 90 tablet 2   latanoprost (XALATAN) 0.005 % ophthalmic solution Place 1 drop into both eyes at bedtime.      levothyroxine (SYNTHROID) 50 MCG tablet Take 1 tablet (50 mcg total) by mouth daily before breakfast. On empty stomach 90 tablet 3   losartan-hydrochlorothiazide (HYZAAR) 50-12.5 MG tablet Take 1 tablet by mouth daily. 90 tablet 3   Multiple Vitamin (MULTIVITAMIN) tablet Take 1 tablet by mouth daily.     rosuvastatin (CRESTOR) 40 MG tablet Take 1 tablet (40 mg total) by mouth daily. 90 tablet 3   triamcinolone ointment (KENALOG) 0.1 % Apply to affected skin one to two times daily for up to two weeks 30 g  0   valACYclovir (VALTREX) 1000 MG tablet Take 1 tablet (1,000 mg total) by mouth 2 (two) times daily. 30 tablet 5   albuterol (VENTOLIN HFA) 108 (90 Base) MCG/ACT inhaler Inhale 2 puffs into the lungs every 4 (four) hours as needed for wheezing or shortness of breath. (Patient not taking: Reported on 08/20/2021) 8 g 0   carvedilol (COREG) 6.25 MG tablet Take 1 tablet (6.25 mg total) by mouth 2 (two) times daily. 180 tablet 3   carvedilol (COREG) 6.25 MG tablet Take 1 tablet (6.25 mg total) by mouth 2 (two) times daily. 60 tablet 0   DULoxetine (CYMBALTA) 30 MG capsule Take 1 capsule (30 mg total) by mouth daily. 90 capsule 1   pregabalin (LYRICA) 25 MG capsule Take 25 mg by mouth 3 (three) times daily.     vedolizumab (ENTYVIO) 300 MG injection Infuse every 8 weeks. Patient is okay to restart the Entyvio 1 each 5   No facility-administered medications prior to visit.     Objective:     BP 120/64 (BP Location: Left Arm, Patient Position: Sitting, Cuff Size: Normal)    Pulse 83    Temp (!) 97.1 F (36.2 C) (Oral)    Ht 5' (1.524 m)    Wt 150 lb 9.6 oz (68.3 kg)    SpO2 100%    BMI 29.41 kg/m   SpO2: 100 %  Amb bf jumping from topic to topic s fully answering the prior question with any specificity, esp when asked about symptoms "I want to hear how you feel, not what other doctors say, so I can help them help you"   Declined exam / irritated about the questions asked "you are too persistent"  and "you don't want to hear what the other doctors have to say"    When I explained I already reviewed the record but wanted to hear from her, she decided to terminate the evaluation and would not accept my apology for "asking too many questions"   Declined walking study.      Assessment   DOE (dyspnea on exertion) January 2014 echocardiogram by Dr. CJerrye Beaversat normal per patient [facility], 3 mm right upper lobe pulmonary  nodule, no lymphadenopathy [facility], hemoglobin of 12.7, d-dimer of 741 (elevated) calcium of 10.7 bicarbonate 24 (normal) 10/2012 Lexiscan> non-reversible defect ant/inf wall 10/2102 [facility] ([facility] > Normal LVF, EF 55%; minor irreg CAD; note says R fem vein accessed but no RHC numbers reported 11/07/2012 simple spirometry poor  patient effort, uninterpretable 11/14/2012 unable to complete PFTs, did not follow commands 05/05/2021 L sided segmental upper and lower PE rx eliquis 06/11/21 echo nl R sided pressures   Unable to shed further light on her multiple chest complaints which appear quite chronic and are ripe for misinterpretation and expose this patient to polypharmacy and misdiagnosis (dx of copd for example in chart in a never smoker is not tenable)    I explained as clearly and logically as I could how symptoms are evaluated and now important it is to understand response to Rx as part of the evaluation process rather than just ordering more meds or tests, but she became irritated by the questions and terminated the evaluation before it was complete.   Would consider referring to alternate pulmonary provider or service as both Dr Lake Bells nor I have been able to help her.   As regards to dx /rx of PE, as long as she continues to have symptoms that overlap with recurrent PE, I would favor keeping her on at least a prophylactic dose once it has been established that she has no active clots (baseline D dimer and venous dopplers would do in absence of any evidence of elevated R Ht pressures on echo-  nl in 06/11/21 which was p PE)              Total time for H and P, chart review, counseling/ same day charting  > 45 min           Christinia Gully, MD 10/15/2021

## 2021-10-15 NOTE — Patient Instructions (Addendum)
No follow up requested by the patient.

## 2021-10-15 NOTE — Assessment & Plan Note (Addendum)
January 2014 echocardiogram by Dr. Jerrye Beavers at normal per patient January 2014 CT angio chest at [facility] no evidence of pulmonary embolism, 3 mm right upper lobe pulmonary nodule, no lymphadenopathy January 2014 labwork at [facility] includes a pro BNP value of 27, hemoglobin of 12.7, d-dimer of 741 (elevated) calcium of 10.7 bicarbonate 24 (normal) 10/2012 Lexiscan> non-reversible defect ant/inf wall 10/2102 LHC ([facility]) > Normal LVF, EF 55%; minor irreg CAD; note says R fem vein accessed but no RHC numbers reported 11/07/2012 simple spirometry poor patient effort, uninterpretable 11/14/2012 unable to complete PFTs, did not follow commands 05/05/2021 L sided segmental upper and lower PE rx eliquis 06/11/21 echo nl R sided pressures   Unable to shed further light on her multiple chest complaints which appear quite chronic and are ripe for misinterpretation and expose this patient to polypharmacy and misdiagnosis (dx of copd for example in chart in a never smoker is not tenable)    I explained as clearly and logically as I could how symptoms are evaluated and now important it is to understand response to Rx as part of the evaluation process rather than just ordering more meds or tests, but she became irritated by the questions and terminated the evaluation before it was complete.   Would consider referring to alternate pulmonary provider or service as both Dr Lake Bells nor I have been able to help her.   As regards to dx /rx of PE, as long as she continues to have symptoms that overlap with recurrent PE, I would favor keeping her on at least a prophylactic dose once it has been established that she has no active clots (baseline D dimer and venous dopplers would do in absence of any evidence of elevated R Ht pressures on echo-  nl in 06/11/21 which was p PE)              Total time for H and P, chart review, counseling/ same day charting  > 45 min

## 2021-10-16 ENCOUNTER — Telehealth: Payer: Self-pay

## 2021-10-16 NOTE — Telephone Encounter (Signed)
Spoke to patient to follow up on yesterdays visit. Offered appointment with another provider or referral to pulmonologist.   She stated that she would like time to process this. She will call back for appointment with Dr. Patsey Berthold if wishes to proceed with our clinic.  I apologized to patient for the unsatisfied experience and ensured her that we are here to help her.

## 2021-10-19 ENCOUNTER — Other Ambulatory Visit: Payer: Medicare Other

## 2021-10-27 ENCOUNTER — Ambulatory Visit: Payer: Medicare Other | Admitting: Cardiovascular Disease

## 2021-11-09 ENCOUNTER — Other Ambulatory Visit (INDEPENDENT_AMBULATORY_CARE_PROVIDER_SITE_OTHER): Payer: Self-pay | Admitting: Nurse Practitioner

## 2021-11-09 DIAGNOSIS — M79605 Pain in left leg: Secondary | ICD-10-CM

## 2021-11-10 ENCOUNTER — Other Ambulatory Visit: Payer: Self-pay

## 2021-11-10 ENCOUNTER — Ambulatory Visit (INDEPENDENT_AMBULATORY_CARE_PROVIDER_SITE_OTHER): Payer: Medicare Other | Admitting: Vascular Surgery

## 2021-11-10 ENCOUNTER — Encounter (INDEPENDENT_AMBULATORY_CARE_PROVIDER_SITE_OTHER): Payer: Self-pay | Admitting: Vascular Surgery

## 2021-11-10 ENCOUNTER — Ambulatory Visit (INDEPENDENT_AMBULATORY_CARE_PROVIDER_SITE_OTHER): Payer: Medicare Other

## 2021-11-10 VITALS — BP 158/76 | HR 74 | Resp 16 | Wt 147.0 lb

## 2021-11-10 DIAGNOSIS — M79605 Pain in left leg: Secondary | ICD-10-CM

## 2021-11-10 DIAGNOSIS — I2699 Other pulmonary embolism without acute cor pulmonale: Secondary | ICD-10-CM | POA: Diagnosis not present

## 2021-11-10 DIAGNOSIS — M79609 Pain in unspecified limb: Secondary | ICD-10-CM | POA: Insufficient documentation

## 2021-11-10 DIAGNOSIS — I10 Essential (primary) hypertension: Secondary | ICD-10-CM

## 2021-11-10 NOTE — Progress Notes (Signed)
MRN : [id number]  Carolyn Campbell is a 66 y.o. (1955/12/27) female who presents with chief complaint of  Chief Complaint  Patient presents with   Follow-up    Ultrasound follow up  .  History of Present Illness: Patient returns today in follow up of left leg pain.  2-1/2 years ago, she underwent left great saphenous vein laser ablation.  Interestingly, that is the last time we have seen her.  She had a successful ablation on duplex.  She has been having pain radiating down from her left hip and thigh area down to her lower leg.  This is largely laterally and seen most when she is sitting or laying down.  She does not have claudication symptoms.  No significant pain with activity.  No ulcers or infection.  She does have some chronic lower extremity swelling which is stable. Noninvasive study today demonstrated no visualized saphenous vein in the thigh after previous ablation.  There is some reflux in the left common femoral vein that is likely clinically insignificant.  No other DVT, superficial thrombophlebitis, or superficial reflux was identified.  Current Outpatient Medications  Medication Sig Dispense Refill   DULoxetine (CYMBALTA) 30 MG capsule Take 30 mg by mouth daily.     latanoprost (XALATAN) 0.005 % ophthalmic solution Place 1 drop into both eyes at bedtime.      levothyroxine (SYNTHROID) 50 MCG tablet Take 1 tablet (50 mcg total) by mouth daily before breakfast. On empty stomach 90 tablet 3   losartan-hydrochlorothiazide (HYZAAR) 50-12.5 MG tablet Take 1 tablet by mouth daily. 90 tablet 3   Multiple Vitamin (MULTIVITAMIN) tablet Take 1 tablet by mouth daily.     rosuvastatin (CRESTOR) 40 MG tablet Take 1 tablet (40 mg total) by mouth daily. 90 tablet 3   triamcinolone ointment (KENALOG) 0.1 % Apply to affected skin one to two times daily for up to two weeks 30 g 0   valACYclovir (VALTREX) 1000 MG tablet Take 1 tablet (1,000 mg total) by mouth 2 (two) times daily. 30 tablet 5    apixaban (ELIQUIS) 5 MG TABS tablet 1 tablet (76m) twice daily 90 tablet 2   No current facility-administered medications for this visit.    Past Medical History:  Diagnosis Date   Abdominal pain, epigastric    Allergic rhinitis    Arthritis    Asthma    [place]   [place] LUMPECTOMY   [place]    Cognitive deficits as late effect of cerebrovascular disease    [place]D [place]ch[place] obstructive pulmonary disease) [place]    COVID-19 virus infection 04/2021   Crohn's disease of both small and large intestine [place] bleeding [place] 12/04/2014   Depression    Currently taking zoloft.   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 05/2021 Echo: EF 60-65%, no rwma, nl RV fxn.   History of kidney stones    Hyperlipidemia    Hypertension    Hypothyroidism    Nonobstructive CAD (coronary artery disease)    a. 10/2012 Cath: diffuse minor irregs-->med rx; b. 08/2021 Cor CTA: Ca2+ = 55.6 (77th%'ile), LAD 25p, LCX <25p. No signif non-cardiac findings.   Occlusion, cerebral artery    NOS w/infarction   Osteoporosis    Personal history of radiation  therapy 2006   BREAST CA   Pulmonary embolism ([place])    a.04/2021 CTA Chest: Small filling defects are noted in upper and lower lobe branches of L PA-->acute PE-->eliquis. *PE occurred in context of COVID infxn.   Stroke [facility])    Ulcer     Past Surgical History:  Procedure Laterality Date   BREAST BIOPSY Left 2006   POS   BREAST LUMPECTOMY Left 09/20/2004   positive   BREAST SURGERY Left    malignant biopsy   CARDIAC CATHETERIZATION     COLONOSCOPY  2015   COLONOSCOPY WITH PROPOFOL N/A 06/08/2017   Procedure: COLONOSCOPY WITH PROPOFOL;  Surgeon: Lin Landsman, MD;  Location: [place];  Service: Gastroenterology;  Laterality: N/A;    COLONOSCOPY WITH PROPOFOL N/A 03/08/2019   Procedure: COLONOSCOPY WITH PROPOFOL;  Surgeon: Lin Landsman, MD;  Location[facility] END[facility]COPY;  Service: Gastroenterology;  Laterality: N/A;   COLONOSCOPY WITH PROPOFOL N/A 07/16/2020   Procedure: COLONOSCOPY WITH PROPOFOL;  Surgeon: Lin Landsman, MD;  Location: [facility] ENDOSCOPY;  Service: Gastroenterology;  Laterality: N/A;   ESOPHAGOGASTRODUODENOSCOPY  2015   ESOPHAGOGASTRODUODENOSCOPY (EGD) WITH PROPOFOL N/A 06/08/2017   Procedure: ESOPHAGOGASTRODUODENOSCOPY (EGD) WITH PROPOFOL;  Surgeon: Lin Landsman, MD;  Location: [place];  Service: Gastroenterology;  Laterality: N/A;   ESOPHAGOGASTRODUODENOSCOPY (EGD) WITH PROPOFOL N/A 03/08/2019   Procedure: ESOPHAGOGASTRODUODENOSCOPY (EGD) WITH PROPOFOL;  Surgeon: Lin Landsman, MD;  Location: [facility] ENDOSCOPY;  Service: Gastroenterology;  Laterality: N/A;   FINGER SURGERY     Right small finger   FRACTURE SURGERY     GIVENS CAPSULE STUDY  2015   TUBAL LIGATION       Social History   Tobacco Use   Smoking status: Never   Smokeless tobacco: Never  Vaping Use   Vaping Use: Never used  Substance Use Topics   Alcohol use: No    Alcohol/week: 0.0 standard drinks   Drug use: No       Family History  Problem Relation Age of Onset   Heart disease Mother    Heart attack Mother    Breast cancer Sister    Alcohol abuse Father    Cerebrovascular Accident Father    Arthritis Father    Hypertension Father    Hypertension Other    Diabetes Other    Breast cancer Sister 31   Heart attack Other 75   Cancer Sister      No Known Allergies   REVIEW OF SYSTEMS (Negative unless checked)  Constitutional: [] Weight loss  [] Fever  [] Chills Cardiac: [] Chest pain   [] Chest pressure   [] Palpitations   [] Shortness of breath when laying flat   [] Shortness of breath at rest   [] Shortness of breath with exertion. Vascular:  [x] Pain in legs with walking   [x] Pain in legs at  rest   [] Pain in legs when laying flat   [] Claudication   [] Pain in feet when walking  [] Pain in feet at rest  [] Pain in feet when laying flat   [] History of DVT   [] Phlebitis   [x] Swelling in legs   [] Varicose veins   [] Non-healing ulcers Pulmonary:   [] Uses home oxygen   [] Productive cough   [] Hemoptysis   [] Wheeze  [] COPD   [] Asthma Neurologic:  [] Dizziness  [] Blackouts   [] Seizures   [] History of stroke   [] History of TIA  [] Aphasia   [] Temporary blindness   [] Dysphagia   [] Weakness or numbness in arms   [] Weakness or numbness in legs Musculoskeletal:  [  x]Arthritis   [] Joint swelling   [] Joint pain   [] Low back pain Hematologic:  [] Easy bruising  [] Easy bleeding   [] Hypercoagulable state   [] Anemic   Gastrointestinal:  [] Blood in stool   [] Vomiting blood  [] Gastroesophageal reflux/heartburn   [] Abdominal pain Genitourinary:  [] Chronic kidney disease   [] Difficult urination  [] Frequent urination  [] Burning with urination   [] Hematuria Skin:  [] Rashes   [] Ulcers   [] Wounds Psychological:  [] History of anxiety   []  History of major depression.  Physical Examination  BP (!) 158/76 (BP Location: Right Arm)    Pulse 74    Resp 16    Wt 147 lb (66.7 kg)    BMI 28.71 kg/m  Gen:  WD/WN, NAD Head: [place]/AT, No temporalis wasting. Ear/Nose/Throat: Hearing grossly intact, nares w/o erythema or drainage Eyes: Conjunctiva clear. Sclera non-icteric Neck: Supple.  Trachea midline Pulmonary:  Good air movement, no use of accessory muscles.  Cardiac: RRR, no JVD Vascular:  Vessel Right Left  Radial Palpable Palpable                          PT Palpable Palpable  DP Palpable Palpable    Musculoskeletal: M/S 5/5 throughout.  No deformity or atrophy. Trace RLE edema, 1+ LLE edema. Neurologic: Sensation grossly intact in extremities.  Symmetrical.  Speech is fluent.  Psychiatric: Judgment intact, Mood & affect appropriate for pt's clinical situation. Dermatologic: No rashes or ulcers noted.  No  cellulitis or open wounds.      Labs No results found for this or any previous visit (from the past 2160 hour(s)).  Radiology No results found.  Assessment/Plan  Pain in limb Noninvasive study today demonstrated no visualized saphenous vein in the thigh after previous ablation.  There is some reflux in the left common femoral vein that is likely clinically insignificant.  No other DVT, superficial thrombophlebitis, or superficial reflux was identified. The description of her pain sounds much more neuropathic in nature and I think she is having sciatica type symptoms.  I discussed the differences between sciatica and vascular disease.  She does not have claudication and has pulses distally so I do not think this is arterial insufficiency.  She does not appear to have significant residual venous disease.  I will see her back as needed.  PE (pulmonary thromboembolism) ([place]) Following COVID infection.  Had 6 months of anticoagulation and has now stopped.  Benign essential HTN blood pressure control important in reducing the progression of atherosclerotic disease. On appropriate oral medications.    Leotis Pain, MD  11/10/2021 4:51 PM    This note was created with Dragon medical transcription system.  Any errors from dictation are purely unintentional

## 2021-11-10 NOTE — Assessment & Plan Note (Signed)
blood pressure control important in reducing the progression of atherosclerotic disease. On appropriate oral medications.  

## 2021-11-10 NOTE — Assessment & Plan Note (Signed)
Following COVID infection.  Had 6 months of anticoagulation and has now stopped.

## 2021-11-10 NOTE — Assessment & Plan Note (Signed)
Noninvasive study today demonstrated no visualized saphenous vein in the thigh after previous ablation.  There is some reflux in the left common femoral vein that is likely clinically insignificant.  No other DVT, superficial thrombophlebitis, or superficial reflux was identified. The description of her pain sounds much more neuropathic in nature and I think she is having sciatica type symptoms.  I discussed the differences between sciatica and vascular disease.  She does not have claudication and has pulses distally so I do not think this is arterial insufficiency.  She does not appear to have significant residual venous disease.  I will see her back as needed.

## 2021-11-26 ENCOUNTER — Other Ambulatory Visit: Payer: Self-pay | Admitting: Family Medicine

## 2021-11-30 ENCOUNTER — Ambulatory Visit (INDEPENDENT_AMBULATORY_CARE_PROVIDER_SITE_OTHER): Payer: Medicare Other | Admitting: Internal Medicine

## 2021-11-30 ENCOUNTER — Encounter: Payer: Self-pay | Admitting: Internal Medicine

## 2021-11-30 VITALS — BP 116/72 | HR 99 | Temp 97.6°F | Resp 16 | Ht 60.0 in | Wt 146.8 lb

## 2021-11-30 DIAGNOSIS — Z1231 Encounter for screening mammogram for malignant neoplasm of breast: Secondary | ICD-10-CM | POA: Diagnosis not present

## 2021-11-30 NOTE — Patient Instructions (Addendum)
It was great seeing you today! ? ?Plan discussed at today's visit: ?-Mammogram ordered  ?-Let me know if breast pain worsens in severity, it is most likely related to surgical scarring from lumpectomy previously ? ?Follow up in: as needed  ? ?Take care and let us know if you have any questions or concerns prior to your next visit. ? ?Dr. Rosana Berger ? ?

## 2021-11-30 NOTE — Progress Notes (Signed)
Acute Office Visit  Subjective:    Patient ID: Carolyn Campbell, female    DOB: 01/05/1956, 66 y.o.   MRN: [id number]  Chief Complaint  Patient presents with   Breast Problem    Tenderness sometimes.  Pt wants mammogram ordered.    HPI Patient is in today for breast pain. Located on the inner upper quadrant of the left breast where her bra rubs. She also has a history of breast cancer in 2006 - lumpectomy and radiation on left breast. Pain is located over lumpectomy scars.  Breast Pain: -Duration : 2 weeks -Location: left -Onset: sudden -Severity: mild -Quality: sore -Frequency: intermittent -Redness: no -Swelling: no -Trauma: no trauma -Nipple discharge: no -Breast lump: no -Status: fluctuating -Treatments attempted: none -Previous mammogram: yes. 2/22 Birads-1   Past Medical History:  Diagnosis Date   Abdominal pain, epigastric    Allergic rhinitis    Arthritis    Asthma    Breast cancer ([place]) 2006   LT LUMPECTOMY   Clotting disorder ([place])    Cognitive deficits as late effect of cerebrovascular disease    COPD (chronic obstructive pulmonary disease) ([place])    COVID-19 virus infection 04/2021   Crohn's disease of both small and large intestine with rectal bleeding ([place]) 12/04/2014   Depression    Currently taking zoloft.   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 05/2021 Echo: EF 60-65%, no rwma, nl RV fxn.   History of kidney stones    Hyperlipidemia    Hypertension    Hypothyroidism    Nonobstructive CAD (coronary artery disease)    a. 10/2012 Cath: diffuse minor irregs-->med rx; b. 08/2021 Cor CTA: Ca2+ = 55.6 (77th%'ile), LAD 25p, LCX <25p. No signif non-cardiac findings.   Occlusion, cerebral artery    NOS w/infarction   Osteoporosis    Personal history of radiation therapy  2006   BREAST CA   Pulmonary embolism ([place])    a.04/2021 CTA Chest: Small filling defects are noted in upper and lower lobe branches of L PA-->acute PE-->eliquis. *PE occurred in context of COVID infxn.   [facility])    Ulcer     Past Surgical History:  Procedure Laterality Date   BREAST BIOPSY Left 2006   POS   BREAST LUMPECTOMY Left 09/20/2004   positive   BREAST SURGERY Left    malignant biopsy   CARDIAC CATHETERIZATION     COLONOSCOPY  2015   COLONOSCOPY WITH PROPOFOL N/A 06/08/2017   Procedure: COLONOSCOPY WITH PROPOFOL;  Surgeon: Lin Landsman, MD;  Location: [place];  Service: Gastroenterology;  Laterality: N/A;   COLONOSCOPY WITH PROPOFOL N/A 03/08/2019   Procedure: COLONOSCOPY WITH PROPOFOL;  Surgeon: Lin Landsman, MD;  Location: [facility] ENDOSCOPY;  Service: Gastroenterology;  Laterality: N/A;   COLONOSCOPY WITH PROPOFOL N/A 07/16/2020   Procedure: COLONOSCOPY WITH PROPOFOL;  Surgeon: Lin Landsman, MD;  Location: [facility] ENDOSCOPY;  Service: Gastroenterology;  Laterality: N/A;   ESOPHAGOGASTRODUODENOSCOPY  2015   ESOPHAGOGASTRODUODENOSCOPY (EGD) WITH PROPOFOL N/A 06/08/2017   Procedure: ESOPHAGOGASTRODUODENOSCOPY (EGD) WITH PROPOFOL;  Surgeon: Lin Landsman, MD;  Location: [place];  Service: Gastroenterology;  Laterality: N/A;   ESOPHAGOGASTRODUODENOSCOPY (EGD) WITH PROPOFOL N/A 03/08/2019   Procedure: ESOPHAGOGASTRODUODENOSCOPY (EGD) WITH PROPOFOL;  Surgeon: Lin Landsman, MD;  Location: [facility] ENDOSCOPY;  Service: Gastroenterology;  Laterality: N/A;   FINGER SURGERY     Right small finger   FRACTURE SURGERY     GIVENS CAPSULE STUDY  2015   TUBAL LIGATION      Family History  Problem Relation Age of Onset   Heart disease Mother    Heart attack Mother    Breast cancer Sister    Alcohol abuse Father    Cerebrovascular Accident Father    Arthritis Father    Hypertension Father    Hypertension Other    Diabetes Other     Breast cancer Sister 52   Heart attack Other 74   Cancer Sister     Social History   Socioeconomic History   Marital status: Married    Spouse name: Not on file   Number of children: 2   Years of education: Not on file   Highest education level: Some college, no degree  Occupational History   Not on file  Tobacco Use   Smoking status: Never   Smokeless tobacco: Never  Vaping Use   Vaping Use: Never used  Substance and Sexual Activity   Alcohol use: No    Alcohol/week: 0.0 standard drinks   Drug use: No   Sexual activity: Yes    Partners: Male    Birth control/protection: None  Other Topics Concern   Not on file  Social History Narrative   Married. One son and one daughter. She is disabled after she had breast cancer and strokes. 2 caffeinated beverages daily.   Social Determinants of Health   Financial Resource Strain: Not on file  Food Insecurity: Not on file  Transportation Needs: Not on file  Physical Activity: Not on file  Stress: Not on file  Social Connections: Not on file  Intimate Partner Violence: Not on file    Outpatient Medications Prior to Visit  Medication Sig Dispense Refill   apixaban (ELIQUIS) 5 MG TABS tablet 1 tablet (31m) twice daily 90 tablet 2   DULoxetine (CYMBALTA) 30 MG capsule Take 30 mg by mouth daily.     latanoprost (XALATAN) 0.005 % ophthalmic solution Place 1 drop into both eyes at bedtime.      levothyroxine (SYNTHROID) 50 MCG tablet Take 1 tablet (50 mcg total) by mouth daily before breakfast. On empty stomach 90 tablet 3   losartan-hydrochlorothiazide (HYZAAR) 50-12.5 MG tablet Take 1 tablet by mouth daily. 90 tablet 3   Multiple Vitamin (MULTIVITAMIN) tablet Take 1 tablet by mouth daily.     rosuvastatin (CRESTOR) 40 MG tablet Take 1 tablet (40 mg total) by mouth daily. 90 tablet 3   triamcinolone ointment (KENALOG) 0.1 % Apply to affected skin one to two times daily for up to two weeks 30 g 0   valACYclovir (VALTREX) 1000 MG  tablet Take 1 tablet (1,000 mg total) by mouth 2 (two) times daily. 30 tablet 5   No facility-administered medications prior to visit.    No Known Allergies  Review of Systems  Constitutional:  Negative for chills and fever.  Skin: Negative.       Objective:    Physical Exam Constitutional:      Appearance: Normal appearance.  HENT:     Head: Normocephalic and atraumatic.  Eyes:     Conjunctiva/sclera: Conjunctivae normal.  Cardiovascular:     Rate and Rhythm: Normal rate and regular rhythm.  Pulmonary:     Effort: Pulmonary effort is normal.     Breath sounds: Normal  breath sounds.  Chest:  Breasts:    Right: Normal.     Left: Tenderness present. No swelling, bleeding, inverted nipple, mass, nipple discharge or skin change.     Comments: Tenderness over lumpectomy scar in left inner outer quadrant Lymphadenopathy:     Upper Body:     Right upper body: No supraclavicular, axillary or pectoral adenopathy.     Left upper body: No supraclavicular, axillary or pectoral adenopathy.  Skin:    General: Skin is warm and dry.  Neurological:     General: No focal deficit present.     Mental Status: She is alert. Mental status is at baseline.  Psychiatric:        Mood and Affect: Mood normal.        Behavior: Behavior normal.    BP 116/72    Pulse 99    Temp 97.6 F (36.4 C)    Resp 16    Ht 5' (1.524 m)    Wt 146 lb 12.8 oz (66.6 kg)    SpO2 98%    BMI 28.67 kg/m  Wt Readings from Last 3 Encounters:  11/10/21 147 lb (66.7 kg)  10/15/21 150 lb 9.6 oz (68.3 kg)  08/28/21 152 lb (68.9 kg)    Health Maintenance Due  Topic Date Due   MAMMOGRAM  10/22/2021    There are no preventive care reminders to display for this patient.   Lab Results  Component Value Date   TSH 1.05 08/04/2021   Lab Results  Component Value Date   WBC 6.4 07/01/2021   HGB 12.8 07/01/2021   HCT 38.3 07/01/2021   MCV 90.5 07/01/2021   PLT 205 07/01/2021   Lab Results  Component Value  Date   NA 144 08/12/2021   K 3.9 08/12/2021   CO2 26 08/12/2021   GLUCOSE 108 (H) 08/12/2021   BUN 10 08/12/2021   CREATININE 0.93 08/12/2021   BILITOT 0.5 01/29/2021   ALKPHOS 102 10/17/2019   AST 20 01/29/2021   ALT 20 01/29/2021   PROT 7.0 01/29/2021   ALBUMIN 4.6 10/17/2019   CALCIUM 10.2 08/12/2021   ANIONGAP 12 07/01/2021   EGFR 67 08/12/2021   GFR 97.91 03/13/2015   Lab Results  Component Value Date   CHOL 193 01/29/2021   Lab Results  Component Value Date   HDL 89 01/29/2021   Lab Results  Component Value Date   LDLCALC 85 01/29/2021   Lab Results  Component Value Date   TRIG 91 01/29/2021   Lab Results  Component Value Date   CHOLHDL 2.2 01/29/2021   No results found for: HGBA1C     Assessment & Plan:   1. Encounter for screening mammogram for malignant neoplasm of breast: Breast pain over previous lumpectomy scars, regular screening mammogram ordered. Can take Tylenol for pain, follow up as needed.   - MM Digital Screening; Future    Teodora Medici, DO

## 2021-12-09 ENCOUNTER — Ambulatory Visit (INDEPENDENT_AMBULATORY_CARE_PROVIDER_SITE_OTHER): Payer: Medicare Other | Admitting: Family Medicine

## 2021-12-09 ENCOUNTER — Encounter: Payer: Self-pay | Admitting: Family Medicine

## 2021-12-09 VITALS — BP 132/64 | HR 95 | Temp 97.9°F | Resp 16 | Ht 60.0 in | Wt 145.9 lb

## 2021-12-09 DIAGNOSIS — G4750 Parasomnia, unspecified: Secondary | ICD-10-CM

## 2021-12-09 DIAGNOSIS — M5440 Lumbago with sciatica, unspecified side: Secondary | ICD-10-CM

## 2021-12-09 DIAGNOSIS — G2581 Restless legs syndrome: Secondary | ICD-10-CM

## 2021-12-09 DIAGNOSIS — G8929 Other chronic pain: Secondary | ICD-10-CM

## 2021-12-09 DIAGNOSIS — R0683 Snoring: Secondary | ICD-10-CM

## 2021-12-09 DIAGNOSIS — F325 Major depressive disorder, single episode, in full remission: Secondary | ICD-10-CM | POA: Diagnosis not present

## 2021-12-09 MED ORDER — SERTRALINE HCL 25 MG PO TABS: 25.0000 mg | ORAL_TABLET | Freq: Every day | ORAL | 0 refills | Status: DC

## 2021-12-09 MED ORDER — DULOXETINE HCL 20 MG PO CPEP: 20.0000 mg | ORAL_CAPSULE | Freq: Every day | ORAL | 0 refills | Status: DC

## 2021-12-09 NOTE — Patient Instructions (Signed)
Try restarting zoloft at a low dose ? ?I also recommend decreasing cymbalta dose since these symptoms appear to have began around the time of the dose increase. ? ?Ask you husband about excessive leg movement in your sleep or snoring and let me know. ? ?Try to get into psychiatry or clinical psychology per your insurance coverage ? ?Follow up in 2 months for recheck on symptoms (please come sooner if any worse) ?

## 2021-12-09 NOTE — Progress Notes (Signed)
? ?Name: Carolyn Campbell   MRN: [id number]    DOB: 1956-04-25   Date:12/09/2021 ? ?     Progress Note ? ?Chief Complaint  ?Patient presents with  ? Sleeping issue  ?  Pt is concerned about having dreams and acting out while dreaming.  ? ? ? ?Subjective:  ? ?Carolyn Campbell is a 67 y.o. female, presents to clinic for parasomnias ? ?She is having vivid dreams and can't wake herself up but she is aware of the dreams and screaming in her sleep ?Started a few months ago and currently happening 3-4 x a week. ?No known triggers or life/med changes  ?Did notice slight dose increase in cymbalta ~3 to 4 months ago 20 mg up to 30 mg that pt states is ineffective ? ?Managing low back and leg pain with specialists she doesn't think she is having RLS ? ?No hx of OSA ? ?Pt got off zoloft last year, weaned off from 100 to 12.5 mg throughout 2022- still feels mood and anxiety is good ? ?  12/09/2021  ?  1:38 PM 11/30/2021  ?  3:39 PM 11/30/2021  ?  3:37 PM  ?Depression screen PHQ 2/9  ?Decreased Interest 0 0 0  ?Down, Depressed, Hopeless 0 1 0  ?PHQ - 2 Score 0 1 0  ?Altered sleeping 0 0 0  ?Tired, decreased energy 0 0 0  ?Change in appetite 0 0 0  ?Feeling bad or failure about yourself  0 0 0  ?Trouble concentrating 0 0 0  ?Moving slowly or fidgety/restless 0 0 0  ?Suicidal thoughts 0 0 0  ?PHQ-9 Score 0 1 0  ?Difficult doing work/chores Not difficult at all Not difficult at all Not difficult at all  ? ? ?  01/29/2021  ?  3:15 PM 09/03/2020  ? 11:24 AM 12/21/2017  ? 11:21 AM  ?GAD 7 : Generalized Anxiety Score  ?Nervous, Anxious, on Edge 0 1 0  ?Control/stop worrying 0 0 0  ?Worry too much - different things 0 0 1  ?Trouble relaxing 0 0 0  ?Restless 0 0 0  ?Easily annoyed or irritable 0 0 0  ?Afraid - awful might happen 0 0 1  ?Total GAD 7 Score 0 1 2  ?Anxiety Difficulty Not difficult at all Not difficult at all   ? ? ?Doesn't feel anxious, denies past trauma, doesn't feel like shes having flash backs of panic attacks ?She has dreams where  there is a lot of conflict and she feels like shes fighting and screaming but can't wake herself up, her husband wakes her, he has PTSD and shes worried she'll trigger him ?No sleep walking ?Overall feels well rested ?No ETOH ?Previously on lyrica for short period of time, but not effective  ? ? ? ? ? ?Current Outpatient Medications:  ?  DULoxetine (CYMBALTA) 30 MG capsule, Take 30 mg by mouth daily., Disp: , Rfl:  ?  latanoprost (XALATAN) 0.005 % ophthalmic solution, Place 1 drop into both eyes at bedtime. , Disp: , Rfl:  ?  levothyroxine (SYNTHROID) 50 MCG tablet, Take 1 tablet (50 mcg total) by mouth daily before breakfast. On empty stomach, Disp: 90 tablet, Rfl: 3 ?  losartan-hydrochlorothiazide (HYZAAR) 50-12.5 MG tablet, Take 1 tablet by mouth daily., Disp: 90 tablet, Rfl: 3 ?  Multiple Vitamin (MULTIVITAMIN) tablet, Take 1 tablet by mouth daily., Disp: , Rfl:  ?  triamcinolone ointment (KENALOG) 0.1 %, Apply to affected skin one to two times daily for  up to two weeks, Disp: 30 g, Rfl: 0 ?  valACYclovir (VALTREX) 1000 MG tablet, Take 1 tablet (1,000 mg total) by mouth 2 (two) times daily., Disp: 30 tablet, Rfl: 5 ?  rosuvastatin (CRESTOR) 40 MG tablet, Take 1 tablet (40 mg total) by mouth daily., Disp: 90 tablet, Rfl: 3 ? ?Patient Active Problem List  ? Diagnosis Date Noted  ? Pain in limb 11/10/2021  ? Nonobstructive CAD (coronary artery disease) 08/06/2021  ? PE (pulmonary thromboembolism) ([place]) 08/04/2021  ? Low back pain radiating to left leg 06/17/2021  ? Glaucoma 02/23/2021  ? Other long term (current) drug therapy 01/22/2021  ? Varicose veins of both lower extremities with pain 07/19/2018  ? Arthritis of right hand 12/21/2017  ? Immunosuppressed status ([place]) 12/21/2017  ? Major depression in remission ([place]) 12/21/2017  ? Fibrocystic breast changes 06/22/2017  ? Osteopenia 10/18/2016  ? Atherosclerosis of aorta ([place]) 12/19/2015  ? Vitreous degeneration 05/21/2015  ? Glaucoma associated with chamber angle  anomalies 05/21/2015  ? Crohn's disease ([place]) 05/21/2015  ? H/O malignant neoplasm of breast 05/21/2015  ? Crohn's disease of both small and large intestine with rectal bleeding ([place]) 12/04/2014  ? DOE (dyspnea on exertion) 11/07/2012  ? Solitary pulmonary nodule 11/07/2012  ? 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 Cognitive deficits as late effect of cerebrovascular disease 01/20/2010  ? CVA, old, hemiparesis ([place]) 04/28/2009  ? Hypothyroidism 06/12/2008  ? Acid reflux 05/23/2007  ? Hypercholesterolemia 01/18/2007  ? Benign essential HTN 01/18/2007  ? ? ?Past Surgical History:  ?Procedure Laterality Date  ? BREAST BIOPSY Left 2006  ? POS  ? BREAST LUMPECTOMY Left 09/20/2004  ? positive  ? BREAST SURGERY Left   ? malignant biopsy  ? CARDIAC CATHETERIZATION    ? COLONOSCOPY  2015  ? COLONOSCOPY WITH PROPOFOL N/A 06/08/2017  ? Procedure: COLONOSCOPY WITH PROPOFOL;  Surgeon: Lin Landsman, MD;  Location: [place];  Service: Gastroenterology;  Laterality: N/A;  ? COLONOSCOPY WITH PROPOFOL N/A 03/08/2019  ? Procedure: COLONOSCOPY WITH PROPOFOL;  Surgeon: Lin Landsman, MD;  Location: [facility] ENDOSCOPY;  Service: Gastroenterology;  Laterality: N/A;  ? COLONOSCOPY WITH PROPOFOL N/A 07/16/2020  ? Procedure: COLONOSCOPY WITH PROPOFOL;  Surgeon: Lin Landsman, MD;  Location: [facility] ENDOSCOPY;  Service: Gastroenterology;  Laterality: N/A;  ? ESOPHAGOGASTRODUODENOSCOPY  2015  ? ESOPHAGOGASTRODUODENOSCOPY (EGD) WITH PROPOFOL N/A 06/08/2017  ? Procedure: ESOPHAGOGASTRODUODENOSCOPY (EGD) WITH PROPOFOL;  Surgeon: Lin Landsman, MD;  Location: [place];  Service: Gastroenterology;  Laterality: N/A;  ? ESOPHAGOGASTRODUODENOSCOPY (EGD) WITH PROPOFOL N/A 03/08/2019  ? Procedure: ESOPHAGOGASTRODUODENOSCOPY (EGD) WITH PROPOFOL;  Surgeon: Lin Landsman, MD;  Location: [facility] END[facility]COPY;  Service: Gastroenterology;  Laterality: N/A;  ? FINGER SURGERY    ? Right small finger   ? FRACTURE SURGERY    ? GIVENS CAPSULE STUDY  2015  ? TUBAL LIGATION    ? ? ?Family History  ?Problem Relation Age of Onset  ? Heart disease Mother   ? Heart attack Mother   ? Breast cancer Sister   ? Alcohol abuse Father   ? Cerebrovascular Accident Father   ? Arthritis Father   ? Hypertension Father   ? Hypertension Other   ? Diabetes Other   ? Breast cancer Sister 58  ? Heart attack Other 41  ? Cancer Sister   ? ? ?Social History  ? ?Tobacco Use  ? Smoking status: Never  ? Smokeless tobacco: Never  ?Vaping Use  ? Vaping  Use: Never used  ?Substance Use Topics  ? Alcohol use: No  ?  Alcohol/week: 0.0 standard drinks  ? Drug use: No  ?  ? ?No Known Allergies ? ?Health Maintenance  ?Topic Date Due  ? MAMMOGRAM  10/22/2021  ? TETANUS/TDAP  03/28/2022  ? DEXA SCAN  09/25/2022  ? COLONOSCOPY (Pts 45-65yr Insurance coverage will need to be confirmed)  07/16/2025  ? Pneumonia Vaccine 68 Years old  Completed  ? INFLUENZA VACCINE  Completed  ? COVID-19 Vaccine  Completed  ? Hepatitis C Screening  Completed  ? Zoster Vaccines- Shingrix  Completed  ? HPV VACCINES  Aged Out  ? ? ?Chart Review Today: ?I personally reviewed active problem list, medication list, allergies, family history, social history, health maintenance, notes from last encounter, lab results, imaging with the patient/caregiver today. ?Additional review of recent specialists documentation, med changes, imaging results, pain management procedures ? ?Review of Systems  ?Constitutional: Negative.   ?HENT: Negative.    ?Eyes: Negative.   ?Respiratory: Negative.    ?Cardiovascular: Negative.   ?Gastrointestinal: Negative.   ?Endocrine: Negative.   ?Genitourinary: Negative.   ?Musculoskeletal: Negative.   ?Skin: Negative.   ?Allergic/Immunologic: Negative.   ?Neurological: Negative.   ?Hematological: Negative.   ?Psychiatric/Behavioral: Negative.    ?All other systems reviewed and are negative.  ? ?Objective:  ? ?Vitals:  ? 12/09/21 1337  ?BP: 132/64  ?Pulse: 95   ?Resp: 16  ?Temp: 97.9 ?F (36.6 ?C)  ?TempSrc: Oral  ?SpO2: 98%  ?Weight: 145 lb 14.4 oz (66.2 kg)  ?Height: 5' (1.524 m)  ? ? ?Body mass index is 28.49 kg/m?. ? ?Physical Exam ?Vitals and nursing n

## 2021-12-10 ENCOUNTER — Encounter: Payer: Self-pay | Admitting: Family Medicine

## 2021-12-10 NOTE — Addendum Note (Signed)
Addended by: Delsa Grana on: 12/10/2021 12:11 PM ? ? Modules accepted: Orders ? ?

## 2021-12-18 ENCOUNTER — Other Ambulatory Visit: Payer: Self-pay

## 2021-12-18 ENCOUNTER — Encounter: Payer: Self-pay | Admitting: Family Medicine

## 2021-12-18 DIAGNOSIS — B001 Herpesviral vesicular dermatitis: Secondary | ICD-10-CM

## 2021-12-18 DIAGNOSIS — G8929 Other chronic pain: Secondary | ICD-10-CM

## 2021-12-18 MED ORDER — VALACYCLOVIR HCL 1 G PO TABS: 1000.0000 mg | ORAL_TABLET | Freq: Two times a day (BID) | ORAL | 3 refills | Status: DC

## 2021-12-18 MED ORDER — DULOXETINE HCL 20 MG PO CPEP: 20.0000 mg | ORAL_CAPSULE | Freq: Every day | ORAL | 0 refills | Status: DC

## 2021-12-31 ENCOUNTER — Other Ambulatory Visit: Payer: Self-pay | Admitting: Nurse Practitioner

## 2022-01-01 ENCOUNTER — Ambulatory Visit (INDEPENDENT_AMBULATORY_CARE_PROVIDER_SITE_OTHER): Payer: Medicare Other | Admitting: Family Medicine

## 2022-01-01 ENCOUNTER — Encounter: Payer: Self-pay | Admitting: Family Medicine

## 2022-01-01 VITALS — BP 130/68 | HR 84 | Temp 98.0°F | Resp 16 | Ht 60.0 in | Wt 145.6 lb

## 2022-01-01 DIAGNOSIS — J329 Chronic sinusitis, unspecified: Secondary | ICD-10-CM

## 2022-01-01 DIAGNOSIS — R5381 Other malaise: Secondary | ICD-10-CM | POA: Diagnosis not present

## 2022-01-01 DIAGNOSIS — J310 Chronic rhinitis: Secondary | ICD-10-CM

## 2022-01-01 DIAGNOSIS — R519 Headache, unspecified: Secondary | ICD-10-CM

## 2022-01-01 DIAGNOSIS — R5383 Other fatigue: Secondary | ICD-10-CM

## 2022-01-01 MED ORDER — LEVOCETIRIZINE DIHYDROCHLORIDE 5 MG PO TABS: 5.0000 mg | ORAL_TABLET | Freq: Every evening | ORAL | 1 refills | Status: DC

## 2022-01-01 MED ORDER — MOMETASONE FUROATE 50 MCG/ACT NA SUSP
2.0000 | Freq: Every day | NASAL | 1 refills | Status: DC
Start: 2022-01-01 — End: 2022-04-22

## 2022-01-01 NOTE — Patient Instructions (Addendum)
Your sinuses look swollen and enlarged - take the allergy medications I sent in - take them daily for the next 2 weeks ? ?Get a follow up appointment or give [place] a call if you suddenly worsen with sinus pain and pressure and fever.  Right now you do not need an antibiotic. ? ?Retest for covid ? ?Treat your aches with over the counter meds, and rest and drink plenty of clear fluids ? ?Sinus Infection, Adult ?A sinus infection is soreness and swelling (inflammation) of your sinuses. Sinuses are hollow spaces in the bones around your face. They are located: ?Around your eyes. ?In the middle of your forehead. ?Behind your nose. ?In your cheekbones. ?Your sinuses and nasal passages are lined with a fluid called mucus. Mucus drains out of your sinuses. Swelling can trap mucus in your sinuses. This lets germs (bacteria, virus, or fungus) grow, which leads to infection. Most of the time, this condition is caused by a virus. ?What are the causes? ?Allergies. ?Asthma. ?Germs. ?Things that block your nose or sinuses. ?Growths in the nose (nasal polyps). ?Chemicals or irritants in the air. ?A fungus. This is rare. ?What increases the risk? ?Having a weak body defense system (immune system). ?Doing a lot of swimming or diving. ?Using nasal sprays too much. ?Smoking. ?What are the signs or symptoms? ?The main symptoms of this condition are pain and a feeling of pressure around the sinuses. Other symptoms include: ?Stuffy nose (congestion). This may make it hard to breathe through your nose. ?Runny nose (drainage). ?Soreness, swelling, and warmth in the sinuses. ?A cough that may get worse at night. ?Being unable to smell and taste. ?Mucus that collects in the throat or the back of the nose (postnasal drip). This may cause a sore throat or bad breath. ?Being very tired (fatigued). ?A fever. ?How is this diagnosed? ?Your symptoms. ?Your medical history. ?A physical exam. ?Tests to find out if your condition is short-term (acute) or  long-term (chronic). Your doctor may: ?Check your nose for growths (polyps). ?Check your sinuses using a tool that has a light on one end (endoscope). ?Check for allergies or germs. ?Do imaging tests, such as an MRI or CT scan. ?How is this treated? ?Treatment for this condition depends on the cause and whether it is short-term or long-term. ?If caused by a virus, your symptoms should go away on their own within 10 days. You may be given medicines to relieve symptoms. They include: ?Medicines that shrink swollen tissue in the nose. ?A spray that treats swelling of the nostrils. ?Rinses that help get rid of thick mucus in your nose (nasal saline washes). ?Medicines that treat allergies (antihistamines). ?Over-the-counter pain relievers. ?If caused by bacteria, your doctor may wait to see if you will get better without treatment. You may be given antibiotic medicine if you have: ?A very bad infection. ?A weak body defense system. ?If caused by growths in the nose, surgery may be needed. ?Follow these instructions at home: ?Medicines ?Take, use, or apply over-the-counter and prescription medicines only as told by your doctor. These may include nasal sprays. ?If you were prescribed an antibiotic medicine, take it as told by your doctor. Do not stop taking it even if you start to feel better. ?Hydrate and humidify ? ?Drink enough water to keep your pee (urine) pale yellow. ?Use a cool mist humidifier to keep the humidity level in your home above 50%. ?Breathe in steam for 10-15 minutes, 3-4 times a day, or as told by  your doctor. You can do this in the bathroom while a hot shower is running. ?Try not to spend time in cool or dry air. ?Rest ?Rest as much as you can. ?Sleep with your head raised (elevated). ?Make sure you get enough sleep each night. ?General instructions ? ?Put a warm, moist washcloth on your face 3-4 times a day, or as often as told by your doctor. ?Use nasal saline washes as often as told by your  doctor. ?Wash your hands often with soap and water. If you cannot use soap and water, use hand sanitizer. ?Do not smoke. Avoid being around people who are smoking (secondhand smoke). ?Keep all follow-up visits. ?Contact a doctor if: ?You have a fever. ?Your symptoms get worse. ?Your symptoms do not get better within 10 days. ?Get help right away if: ?You have a very bad headache. ?You cannot stop vomiting. ?You have very bad pain or swelling around your face or eyes. ?You have trouble seeing. ?You feel confused. ?Your neck is stiff. ?You have trouble breathing. ?These symptoms may be an emergency. Get help right away. Call 911. ?Do not wait to see if the symptoms will go away. ?Do not drive yourself to the hospital. ?Summary ?A sinus infection is swelling of your sinuses. Sinuses are hollow spaces in the bones around your face. ?This condition is caused by tissues in your nose that become inflamed or swollen. This traps germs. These can lead to infection. ?If you were prescribed an antibiotic medicine, take it as told by your doctor. Do not stop taking it even if you start to feel better. ?Keep all follow-up visits. ?This information is not intended to replace advice given to you by your health care provider. Make sure you discuss any questions you have with your health care provider. ?Document Revised: 08/11/2021 Document Reviewed: 08/11/2021 ?Elsevier Patient Education ? [place]. ? ?

## 2022-01-01 NOTE — Progress Notes (Signed)
? ? ?Patient ID: Carolyn Campbell, female    DOB: 03/18/1956, 66 y.o.   MRN: [id number] ? ?PCP: Delsa Grana, PA-C ? ?Chief Complaint  ?Patient presents with  ? Fatigue  ? Headache  ?  Onset for over a week and it has got worst.  ? ? ?Subjective:  ? ?Carolyn Campbell is a 66 y.o. female, presents to clinic with CC of the following: ? ?Headache  ?This is a new problem. The current episode started in the past 7 days. The problem occurs intermittently. The problem has been unchanged. The pain is located in the Frontal region. The pain does not radiate. The pain quality is not similar to prior headaches (doesn't usually have headaches). The quality of the pain is described as aching. The pain is at a severity of 3/10. Associated symptoms include coughing, dizziness, drainage, rhinorrhea and sinus pressure. Pertinent negatives include no abdominal pain, anorexia, back pain, eye pain, eye redness, eye watering, facial sweating, fever, hearing loss, insomnia, loss of balance, muscle aches, nausea, numbness, phonophobia, photophobia, seizures, sore throat, vomiting, weakness or weight loss. Nothing aggravates the symptoms. Treatments tried: generic allergy pill. The treatment provided moderate relief.   ?Hx of allergies, not on meds ? ?Patient Active Problem List  ? Diagnosis Date Noted  ? Pain in limb 11/10/2021  ? Nonobstructive CAD (coronary artery disease) 08/06/2021  ? PE (pulmonary thromboembolism) ([place]) 08/04/2021  ? Low back pain radiating to left leg 06/17/2021  ? Glaucoma 02/23/2021  ? Other long term (current) drug therapy 01/22/2021  ? Varicose veins of both lower extremities with pain 07/19/2018  ? Arthritis of right hand 12/21/2017  ? Immunosuppressed status ([place]) 12/21/2017  ? Major depression in remission ([place]) 12/21/2017  ? Fibrocystic breast changes 06/22/2017  ? Osteopenia 10/18/2016  ? Atherosclerosis of aorta ([place]) 12/19/2015  ? Vitreous degeneration 05/21/2015  ? Glaucoma associated with chamber angle  anomalies 05/21/2015  ? Crohn's disease ([place]) 05/21/2015  ? H/O malignant neoplasm of breast 05/21/2015  ? Crohn's disease of both small and large intestine with rectal bleeding ([place]) 12/04/2014  ? DOE (dyspnea on exertion) 11/07/2012  ? Solitary pulmonary nodule 11/07/2012  ? 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 Cognitive deficits as late effect of cerebrovascular disease 01/20/2010  ? CVA, old, hemiparesis ([place]) 04/28/2009  ? Hypothyroidism 06/12/2008  ? Acid reflux 05/23/2007  ? Hypercholesterolemia 01/18/2007  ? Benign essential HTN 01/18/2007  ? ? ? ? ?Current Outpatient Medications:  ?  DULoxetine (CYMBALTA) 20 MG capsule, Take 1 capsule (20 mg total) by mouth daily., Disp: 90 capsule, Rfl: 0 ?  latanoprost (XALATAN) 0.005 % ophthalmic solution, Place 1 drop into both eyes at bedtime. , Disp: , Rfl:  ?  levothyroxine (SYNTHROID) 50 MCG tablet, Take 1 tablet (50 mcg total) by mouth daily before breakfast. On empty stomach, Disp: 90 tablet, Rfl: 3 ?  losartan-hydrochlorothiazide (HYZAAR) 50-12.5 MG tablet, Take 1 tablet by mouth daily., Disp: 90 tablet, Rfl: 3 ?  Multiple Vitamin (MULTIVITAMIN) tablet, Take 1 tablet by mouth daily., Disp: , Rfl:  ?  sertraline (ZOLOFT) 25 MG tablet, Take 1 tablet (25 mg total) by mouth daily., Disp: 90 tablet, Rfl: 0 ?  triamcinolone ointment (KENALOG) 0.1 %, Apply to affected skin one to two times daily for up to two weeks, Disp: 30 g, Rfl: 0 ?  valACYclovir (VALTREX) 1000 MG tablet, Take 1 tablet (1,000 mg total) by mouth 2 (two) times daily., Disp: 180 tablet, Rfl: 3 ?  rosuvastatin (CRESTOR) 40 MG tablet, Take 1 tablet (40 mg total) by mouth daily., Disp: 90 tablet, Rfl: 3 ? ? ?No Known Allergies ? ? ?Social History  ? ?Tobacco Use  ? Smoking status: Never  ? Smokeless tobacco: Never  ?Vaping Use  ? Vaping Use: Never used  ?Substance Use Topics  ? Alcohol use: No  ?  Alcohol/week: 0.0 standard drinks  ? Drug use: No  ?  ? ? ?Chart Review  Today: ?I personally reviewed active problem list, medication list, allergies, family history, social history, health maintenance, notes from last encounter, lab results, imaging with the patient/caregiver today. ? ? ?Review of Systems  ?Constitutional: Negative.  Negative for fever and weight loss.  ?HENT:  Positive for rhinorrhea and sinus pressure. Negative for hearing loss and sore throat.   ?Eyes: Negative.  Negative for photophobia, pain and redness.  ?Respiratory:  Positive for cough.   ?Cardiovascular: Negative.   ?Gastrointestinal: Negative.  Negative for abdominal pain, anorexia, nausea and vomiting.  ?Endocrine: Negative.   ?Genitourinary: Negative.   ?Musculoskeletal: Negative.  Negative for back pain.  ?Skin: Negative.   ?Allergic/Immunologic: Negative.   ?Neurological:  Positive for dizziness and headaches. Negative for seizures, weakness, numbness and loss of balance.  ?Hematological: Negative.   ?Psychiatric/Behavioral: Negative.  The patient does not have insomnia.   ?All other systems reviewed and are negative. ? ?   ?Objective:  ? ?Vitals:  ? 01/01/22 1153  ?BP: 130/68  ?Pulse: 84  ?Resp: 16  ?Temp: 98 ?F (36.7 ?C)  ?TempSrc: Oral  ?SpO2: 97%  ?Weight: 145 lb 9.6 oz (66 kg)  ?Height: 5' (1.524 m)  ?  ?Body mass index is 28.44 kg/m?. ? ?Physical Exam ?Vitals and nursing note reviewed.  ?Constitutional:   ?   General: She is not in acute distress. ?   Appearance: She is well-developed and normal weight. She is not ill-appearing, toxic-appearing or diaphoretic.  ?   Comments: Appears tired, but NAD, non-toxic  ?HENT:  ?   Head: Normocephalic and atraumatic.  ?   Right Ear: Hearing, tympanic membrane, ear canal and external ear normal. There is no impacted cerumen.  ?   Left Ear: Hearing, tympanic membrane, ear canal and external ear normal. There is no impacted cerumen.  ?   Nose: Mucosal edema, congestion and rhinorrhea present.  ?   Right Sinus: No maxillary sinus tenderness or frontal sinus  tenderness.  ?   Left Sinus: No maxillary sinus tenderness or frontal sinus tenderness.  ?   Mouth/Throat:  ?   Mouth: Mucous membranes are moist. Mucous membranes are not pale.  ?   Pharynx: Oropharynx is clear. Uvula midline. No oropharyngeal exudate, posterior oropharyngeal erythema or uvula swelling.  ?   Tonsils: No tonsillar abscesses.  ?Eyes:  ?   General: No scleral icterus.    ?   Right eye: No discharge.     ?   Left eye: No discharge.  ?   Conjunctiva/sclera: Conjunctivae normal.  ?Neck:  ?   Trachea: No tracheal deviation.  ?Cardiovascular:  ?   Rate and Rhythm: Normal rate and regular rhythm.  ?   Pulses: Normal pulses.  ?   Heart sounds: Normal heart sounds. No murmur heard. ?  No friction rub. No gallop.  ?Pulmonary:  ?   Effort: Pulmonary effort is normal. No respiratory distress.  ?   Breath sounds: Normal breath sounds. No stridor. No wheezing, rhonchi or rales.  ?Abdominal:  ?  General: Bowel sounds are normal. There is no distension.  ?   Palpations: Abdomen is soft.  ?Musculoskeletal:     ?   General: Normal range of motion.  ?   Cervical back: Normal range of motion and neck supple.  ?Lymphadenopathy:  ?   Cervical: No cervical adenopathy.  ?Skin: ?   General: Skin is warm and dry.  ?   Coloration: Skin is not pale.  ?   Findings: No rash.  ?Neurological:  ?   Mental Status: She is alert. Mental status is at baseline.  ?   Sensory: Sensation is intact.  ?   Motor: Motor function is intact. No weakness or abnormal muscle tone.  ?   Coordination: Coordination is intact. Coordination normal.  ?   Gait: Gait normal.  ?Psychiatric:     ?   Mood and Affect: Mood normal.     ?   Behavior: Behavior normal.  ?  ? ?Results for orders placed or performed in visit on 08/12/21  ?Basic metabolic panel  ?Result Value Ref Range  ? Glucose 108 (H) 70 - 99 mg/dL  ? BUN 10 8 - 27 mg/dL  ? Creatinine, Ser 0.93 0.57 - 1.00 mg/dL  ? eGFR 67 >59 mL/min/1.73  ? BUN/Creatinine Ratio 11 (L) 12 - 28  ? Sodium 144 134 -  144 mmol/L  ? Potassium 3.9 3.5 - 5.2 mmol/L  ? Chloride 102 96 - 106 mmol/L  ? CO2 26 20 - 29 mmol/L  ? Calcium 10.2 8.7 - 10.3 mg/dL  ? ? ?   ?Assessment & Plan:  ? ? ?1. Rhinosinusitis ?Patient presents for frontal h

## 2022-02-05 ENCOUNTER — Encounter: Payer: Self-pay | Admitting: Family Medicine

## 2022-03-08 ENCOUNTER — Telehealth: Payer: Self-pay

## 2022-03-08 NOTE — Telephone Encounter (Signed)
Patient is calling because she stopped the [facility] since December. Patient states she has not seen Dr. Marius Ditch since 01/22/2021. She states she took it on her self to stop the medication because she was feeling okay. She is wanting to set up a follow up appointment. Made appointment for 03/10/2022

## 2022-03-10 ENCOUNTER — Ambulatory Visit: Payer: Medicare Other | Admitting: Gastroenterology

## 2022-04-20 NOTE — Progress Notes (Unsigned)
Psychiatric Initial Adult Assessment   Patient Identification: Carolyn Campbell MRN:  [id number] Date of Evaluation:  04/22/2022 Referral Source: Delsa Grana, PA-C  Chief Complaint:   Chief Complaint  Patient presents with   Establish Care   Visit Diagnosis:    ICD-10-CM   1. Restless leg syndrome  G25.81 Ferritin    CBC    2. MDD (major depressive disorder), recurrent episode, mild (HCC)  F33.0       History of Present Illness:   Carolyn Campbell is a 66 y.o. year old female with a history of depression, REM sleep behavior disorder, Multifactorial cognitive impairment  history of stroke in 2008, prediabetes, mild sleep apnea per sleep study (no CPAP needed), who is referred for depression.   She states that she has been emotional.  She was feeling that she did not want to be on earth.  She feels a burden to others, and thought others would be better if she were to be off dead.  Although she has been communicating with her husband, she does not want to talk with others.  She reports struggle with what she says. (She often times reports difficult in explaining herself.) She states that she needs to think first after having a stroke.   She reports some conflict with her husband.  They are not physically intimate for the past year due to some issues with her husband.  She feels that he may not love her due to this. She thinks she is hard on herself, and wants to have confidence in herself. She reports good relationship with her children. She has started to go to the gym as recommended by her provider.   Depression- She has depressive symptoms as in PHQ-9.  Her appetite has been stable.  Although she reports passive SI, she adamantly denies any plan or intent.   Insomnia-she reports improvement in sleep behavior after being on clonazepam.  She reports she has been more emotional when she takes 0.5 mg clonazepam; she is currently taking 0.25 mg at night.   Physical-she reports neck pain secondary  to sacroiliac joint pain.  The only treatment works for this pain is injection.   Medication- duloxetine 30 mg daily (for several months. She reports limited benefit from this medication)  Daily routine: She describes herself as "inside person", watching TV,  playing games on the phone  Household: husband Marital status:married for 49 years Number of children: 2 Employment: unemployed since 2008 due to stroke, used to work as Corporate investment banker Education:  diploma in Psychologist, sport and exercise Last PCP / ongoing medical evaluation:   She states that her father was very strict.  She was raised in a "bubble" and did not know how things work until she got married with her husband.  She denies any abuse from her parents, and reports good support from them, although they were not loving.  She had 9 sisters and 1 brother.  She was bullied when she was a child.    Wt Readings from Last 3 Encounters:  04/22/22 140 lb 9.6 oz (63.8 kg)  01/01/22 145 lb 9.6 oz (66 kg)  12/09/21 145 lb 14.4 oz (66.2 kg)      Associated Signs/Symptoms: Depression Symptoms:  depressed mood, insomnia, fatigue, difficulty concentrating, suicidal thoughts without plan, anxiety, (Hypo) Manic Symptoms:   denies decreased need for sleep, euphoria Anxiety Symptoms:   mild anxiety Psychotic Symptoms:   denies AH, VH, paranoia PTSD Symptoms: Negative  Past Psychiatric History:  Outpatient: saw  a psychiatrist years ago Psychiatry admission: denies Previous suicide attempt: denies  Past trials of medication: sertraline, duloxetine History of violence:    Previous Psychotropic Medications: Yes   Substance Abuse History in the last 12 months:  No.  Consequences of Substance Abuse: NA  Past Medical History:  Past Medical History:  Diagnosis Date   Abdominal pain, epigastric    Allergic rhinitis    Arthritis    Asthma    Breast cancer ([place]) 2006   LT LUMPECTOMY   Clotting disorder ([place])    Cognitive deficits as late  effect of cerebrovascular disease    COPD (chronic obstructive pulmonary disease) ([place])    COVID-19 virus infection 04/2021   Crohn's disease of both small and large intestine with rectal bleeding ([place]) 12/04/2014   Depression    Currently taking zoloft.   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 05/2021 Echo: EF 60-65%, no rwma, nl RV fxn.   History of kidney stones    Hyperlipidemia    Hypertension    Hypothyroidism    Nonobstructive CAD (coronary artery disease)    a. 10/2012 Cath: diffuse minor irregs-->med rx; b. 08/2021 Cor CTA: Ca2+ = 55.6 (77th%'ile), LAD 25p, LCX <25p. No signif non-cardiac findings.   Occlusion, cerebral artery    NOS w/infarction   Osteoporosis    Personal history of radiation therapy 2006   BREAST CA   Pulmonary embolism ([place])    a.04/2021 CTA Chest: Small filling defects are noted in upper and lower lobe branches of L PA-->acute PE-->eliquis. *PE occurred in context of COVID infxn.   Stroke [facility])    Ulcer     Past Surgical History:  Procedure Laterality Date   BREAST BIOPSY Left 2006   POS   BREAST LUMPECTOMY Left 09/20/2004   positive   BREAST SURGERY Left    malignant biopsy   CARDIAC CATHETERIZATION     COLONOSCOPY  2015   COLONOSCOPY WITH PROPOFOL N/A 06/08/2017   Procedure: COLONOSCOPY WITH PROPOFOL;  Surgeon: Lin Landsman, MD;  Location: [place];  Service: Gastroenterology;  Laterality: N/A;   COLONOSCOPY WITH PROPOFOL N/A 03/08/2019   Procedure: COLONOSCOPY WITH PROPOFOL;  Surgeon: Lin Landsman, MD;  Location: [facility] ENDOSCOPY;  Service: Gastroenterology;  Laterality: N/A;   COLONOSCOPY WITH PROPOFOL N/A 07/16/2020   Procedure: COLONOSCOPY WITH PROPOFOL;  Surgeon: Lin Landsman, MD;  Location[facility] ENDOSCOPY;  Service: Gastroenterology;   Laterality: N/A;   ESOPHAGOGASTRODUODENOSCOPY  2015   ESOPHAGOGASTRODUODENOSCOPY (EGD) WITH PROPOFOL N/A 06/08/2017   Procedure: ESOPHAGOGASTRODUODENOSCOPY (EGD) WITH PROPOFOL;  Surgeon: Lin Landsman, MD;  Location: [place];  Service: Gastroenterology;  Laterality: N/A;   ESOPHAGOGASTRODUODENOSCOPY (EGD) WITH PROPOFOL N/A 03/08/2019   Procedure: ESOPHAGOGASTRODUODENOSCOPY (EGD) WITH PROPOFOL;  Surgeon: Lin Landsman, MD;  Location: [facility] ENDOSCOPY;  Service: Gastroenterology;  Laterality: N/A;   FINGER SURGERY     Right small finger   FRACTURE SURGERY     GIVENS CAPSULE STUDY  2015   TUBAL LIGATION      Family Psychiatric History: denies  Family History:  Family History  Problem Relation Age of Onset   Heart disease Mother    Heart attack Mother    Cerebrovascular Accident Father    Arthritis Father    Hypertension Father  Breast cancer Sister    Breast cancer Sister 101   Cancer Sister    Hypertension Other    Diabetes Other    Heart attack Other 65    Social History:   Social History   Socioeconomic History   Marital status: Married    Spouse name: Proofreader   Number of children: 2   Years of education: Not on file   Highest education level: Some college, no degree  Occupational History   Not on file  Tobacco Use   Smoking status: Never   Smokeless tobacco: Never  Vaping Use   Vaping Use: Never used  Substance and Sexual Activity   Alcohol use: No    Alcohol/week: 0.0 standard drinks of alcohol   Drug use: No   Sexual activity: Yes    Partners: Male    Birth control/protection: None  Other Topics Concern   Not on file  Social History Narrative   Married. One son and one daughter. She is disabled after she had breast cancer and strokes. 2 caffeinated beverages daily.   Social Determinants of Health   Financial Resource Strain: Low Risk  (11/06/2019)   Overall Financial Resource Strain (CARDIA)    Difficulty of Paying Living Expenses:  Not hard at all  Food Insecurity: No Food Insecurity (11/06/2019)   Hunger Vital Sign    Worried About Running Out of Food in the Last Year: Never true    Ran Out of Food in the Last Year: Never true  Transportation Needs: No Transportation Needs (11/06/2019)   PRAPARE - Hydrologist (Medical): No    Lack of Transportation (Non-Medical): No  Physical Activity: Insufficiently Active (11/06/2019)   Exercise Vital Sign    Days of Exercise per Week: 2 days    Minutes of Exercise per Session: 30 min  Stress: No Stress Concern Present (11/06/2019)   [place]    Feeling of Stress : Not at all  Social Connections: Moderately Integrated (11/06/2019)   Social Connection and Isolation Panel [NHANES]    Frequency of Communication with Friends and Family: More than three times a week    Frequency of Social Gatherings with Friends and Family: Three times a week    Attends Religious Services: More than 4 times per year    Active Member of Clubs or Organizations: No    Attends Archivist Meetings: Never    Marital Status: Married    Additional Social History: as above  Allergies:  No Known Allergies  Metabolic Disorder Labs: No results found for: "HGBA1C", "MPG" No results found for: "PROLACTIN" Lab Results  Component Value Date   CHOL 193 01/29/2021   TRIG 91 01/29/2021   HDL 89 01/29/2021   CHOLHDL 2.2 01/29/2021   LDLCALC 85 01/29/2021   [place] 92 09/03/2020   Lab Results  Component Value Date   TSH 1.05 08/04/2021    Therapeutic Level Labs: No results found for: "LITHIUM" No results found for: "CBMZ" No results found for: "VALPROATE"  Current Medications: Current Outpatient Medications  Medication Sig Dispense Refill   clonazePAM (KLONOPIN) 0.5 MG tablet Take 0.5 mg by mouth at bedtime.     latanoprost (XALATAN) 0.005 % ophthalmic solution Place 1 drop into both eyes at  bedtime.      levothyroxine (SYNTHROID) 50 MCG tablet Take 1 tablet (50 mcg total) by mouth daily before breakfast. On empty stomach 90 tablet 3   losartan-hydrochlorothiazide (  HYZAAR) 50-12.5 MG tablet Take 1 tablet by mouth daily. 90 tablet 3   Multiple Vitamin (MULTIVITAMIN) tablet Take 1 tablet by mouth daily.     sertraline (ZOLOFT) 50 MG tablet Take 50 mg by mouth daily. 25 mg at night for one week, then 50 mg at night     triamcinolone ointment (KENALOG) 0.1 % Apply to affected skin one to two times daily for up to two weeks 30 g 0   valACYclovir (VALTREX) 1000 MG tablet Take 1 tablet (1,000 mg total) by mouth 2 (two) times daily. 180 tablet 3   rosuvastatin (CRESTOR) 40 MG tablet Take 1 tablet (40 mg total) by mouth daily. 90 tablet 3   No current facility-administered medications for this visit.    Musculoskeletal: Strength & Muscle Tone: within normal limits Gait & Station: normal Patient leans: N/A  Psychiatric Specialty Exam: Review of Systems  Psychiatric/Behavioral:  Positive for decreased concentration, dysphoric mood and sleep disturbance. Negative for agitation, behavioral problems, confusion, hallucinations, self-injury and suicidal ideas. The patient is nervous/anxious. The patient is not hyperactive.   All other systems reviewed and are negative.   Blood pressure 111/65, pulse 88, temperature 97.9 F (36.6 C), temperature source Temporal, weight 140 lb 9.6 oz (63.8 kg).Body mass index is 27.46 kg/m.  General Appearance: Fairly Groomed  Eye Contact:  Good  Speech:  Clear and Coherent  Volume:  Normal  Mood:  Depressed  Affect:  Appropriate, Congruent, and Tearful  Thought Process:  Coherent  Orientation:  Full (Time, Place, and Person)  Thought Content:  Logical  Suicidal Thoughts:  No  Homicidal Thoughts:  No  Memory:  Immediate;   Fair  Judgement:  Good  Insight:  Fair  Psychomotor Activity:  Normal  Concentration:  Concentration: Good and Attention Span:  Good  Recall:  Good  Fund of Knowledge:Good  Language: Good  Akathisia:  No  Handed:  Right  AIMS (if indicated):  not done  Assets:  Communication Skills Desire for Improvement  ADL's:  Intact  Cognition: WNL  Sleep:  Fair   Screenings: GAD-7    Personnel officer Visit from 04/22/2022 in [place] Office Visit from 01/29/2021 in [facility] Office Visit from 09/03/2020 in [facility] Office Visit from 12/21/2017 in [facility]  Total GAD-7 Score 5 0 1 2      PHQ2-9    [place] Visit from 04/22/2022 in [place] Office Visit from 01/01/2022 in [facility] Office Visit from 12/09/2021 in [facility] Office Visit from 11/30/2021 in [facility] Office Visit from 08/04/2021 in [facility]  PHQ-2 Total Score 2 0 0 1 1  PHQ-9 Total Score 6 0 0 1 [place] ED from 07/01/2021 in [place] [facility] from 05/05/2021 in [place] No Risk Error: Question 1 not populated       Assessment and Plan:  Carolyn Campbell is a 66 y.o. year old female with a history of depression, REM sleep behavior disorder, Multifactorial cognitive impairment  history of stroke in 2008, breast caner s/p left lumpectomy in 2006, prediabetes, mild sleep apnea per sleep study (no CPAP needed), who is referred for depression.    2. MDD (major depressive disorder), recurrent episode, mild ([place]) She reports depressive symptoms and then anxiety was  prominent difficulty in concentration/aphasia at least for the past several months.  Psychosocial stressors includes conflict with her husband, unemployment, and history of stroke.  She believes she was doing better when she was on sertraline, and reports limited  benefit from duloxetine for neuropathic pain either.  Will switch back to sertraline to optimize treatment for depression.  Discussed potential risk of drowsiness and GI side effect.  Also discussed risk of discontinuation symptoms and serotonin syndrome.   1. Restless leg syndrome She has a history of REM sleep behavior disorder, and has restless leg.  Will check ferritin for evaluation of this.   Plan Change medication as follows  Week 1: Start sertraline 25 mg at night, continue duloxetine 20 mg daily Week 2: Increase sertraline 50 mg at night, discontinue duloxetine Week 3- Continue sertraline 50 mg at night  Obtain labs (CBC, ferritin) Next appointment: 9/14 at 1 PM for 30 mins, in person   The patient demonstrates the following risk factors for suicide: Chronic risk factors for suicide include: psychiatric disorder of depression and chronic pain. Acute risk factors for suicide include: family or marital conflict and unemployment. Protective factors for this patient include: positive social support, coping skills, and hope for the future. Considering these factors, the overall suicide risk at this point appears to be low. Patient is appropriate for outpatient follow up.  Although her husband has a gun at home, she does not have access to it.        Collaboration of Care: Other N/A  Patient/Guardian was advised Release of Information must be obtained prior to any record release in order to collaborate their care with an outside provider. Patient/Guardian was advised if they have not already done so to contact the registration department to sign all necessary forms in order for [place] to release information regarding their care.   Consent: Patient/Guardian gives verbal consent for treatment and assignment of benefits for services provided during this visit. Patient/Guardian expressed understanding and agreed to proceed.   Norman Clay, MD 8/3/20232:04 PM

## 2022-04-22 ENCOUNTER — Encounter: Payer: Self-pay | Admitting: Psychiatry

## 2022-04-22 ENCOUNTER — Ambulatory Visit (INDEPENDENT_AMBULATORY_CARE_PROVIDER_SITE_OTHER): Payer: Medicare Other | Admitting: Psychiatry

## 2022-04-22 VITALS — BP 111/65 | HR 88 | Temp 97.9°F | Wt 140.6 lb

## 2022-04-22 DIAGNOSIS — G2581 Restless legs syndrome: Secondary | ICD-10-CM

## 2022-04-22 DIAGNOSIS — F330 Major depressive disorder, recurrent, mild: Secondary | ICD-10-CM

## 2022-04-23 ENCOUNTER — Other Ambulatory Visit
Admission: RE | Admit: 2022-04-23 | Discharge: 2022-04-23 | Disposition: A | Discharge status: Home / Self Care | Payer: Medicare Other | Source: Ambulatory Visit | Attending: Psychiatry | Admitting: Psychiatry

## 2022-04-23 DIAGNOSIS — G2581 Restless legs syndrome: Secondary | ICD-10-CM | POA: Diagnosis present

## 2022-04-23 LAB — CBC
HCT: 37.8 % (ref 36.0–46.0)
Hemoglobin: 12.2 g/dL (ref 12.0–15.0)
MCH: 28.7 pg (ref 26.0–34.0)
MCHC: 32.3 g/dL (ref 30.0–36.0)
MCV: 88.9 fL (ref 80.0–100.0)
Platelets: 233 10*3/uL (ref 150–400)
RBC: 4.25 MIL/uL (ref 3.87–5.11)
RDW: 13.2 % (ref 11.5–15.5)
WBC: 5.6 10*3/uL (ref 4.0–10.5)
nRBC: 0 % (ref 0.0–0.2)

## 2022-04-23 LAB — FERRITIN: Ferritin: 66 ng/mL (ref 11–307)

## 2022-04-24 ENCOUNTER — Encounter (HOSPITAL_COMMUNITY): Payer: Self-pay | Admitting: Psychiatry

## 2022-05-28 ENCOUNTER — Encounter: Payer: Self-pay | Admitting: Family Medicine

## 2022-05-31 ENCOUNTER — Other Ambulatory Visit: Payer: Self-pay | Admitting: Family Medicine

## 2022-05-31 ENCOUNTER — Other Ambulatory Visit: Payer: Self-pay

## 2022-05-31 DIAGNOSIS — Z1231 Encounter for screening mammogram for malignant neoplasm of breast: Secondary | ICD-10-CM

## 2022-05-31 DIAGNOSIS — E038 Other specified hypothyroidism: Secondary | ICD-10-CM

## 2022-06-01 MED ORDER — LEVOTHYROXINE SODIUM 50 MCG PO TABS: 50.0000 ug | ORAL_TABLET | Freq: Every day | ORAL | 0 refills | Status: DC

## 2022-06-01 MED ORDER — ROSUVASTATIN CALCIUM 40 MG PO TABS: 40.0000 mg | ORAL_TABLET | Freq: Every day | ORAL | 0 refills | Status: DC

## 2022-06-02 NOTE — Progress Notes (Signed)
BH MD/PA/NP OP Progress Note  06/03/2022 1:31 PM Carolyn Campbell  MRN:  [id number]  Chief Complaint:  Chief Complaint  Patient presents with   Follow-up   HPI:  This is a follow-up appointment for depression.  She states that she has been doing well except some incident with her daughter.  Her daughter was commenting negatively about her sister-in-law.  She felt that her other family member treats her as if she does not know how to take care of herself.  She also states that one of her sister had a cancer treatment.  Another sibling may have pancreatic cancer; waiting for the biopsy.  She had a good time with her family, going to a show.  She thinks she has been doing more things she likes. The patient has mood symptoms as in PHQ-9/GAD-7. She denies SI.  She feels better since being on sertraline, and would prefer to continue the same dose.   Wt Readings from Last 3 Encounters:  06/03/22 148 lb (67.1 kg)  04/22/22 140 lb 9.6 oz (63.8 kg)  01/01/22 145 lb 9.6 oz (66 kg)     Daily routine: She describes herself as "inside person", watching TV,  playing games on the phone  Household: husband Marital status:married for 49 years Number of children: 2 Employment: unemployed since 2008 due to stroke, used to work as Corporate investment banker Education:  diploma in Psychologist, sport and exercise Last PCP / ongoing medical evaluation:   She states that her father was very strict.  She was raised in a "bubble" and did not know how things work until she got married with her husband.  She denies any abuse from her parents, and reports good support from them, although they were not loving.  She had 9 sisters and 1 brother.  She was bullied when she was a child.   Wt Readings from Last 3 Encounters:  06/03/22 148 lb (67.1 kg)  04/22/22 140 lb 9.6 oz (63.8 kg)  01/01/22 145 lb 9.6 oz (66 kg)    Visit Diagnosis:    ICD-10-CM   1. MDD (major depressive disorder), recurrent episode, mild ([place])  F33.0       Past  Psychiatric History: Please see initial evaluation for full details. I have reviewed the history. No updates at this time.     Past Medical History:  Past Medical History:  Diagnosis Date   Abdominal pain, epigastric    Allergic rhinitis    Arthritis    Asthma    Breast cancer ([place]) 2006   LT LUMPECTOMY   Clotting disorder ([place])    Cognitive deficits as late effect of cerebrovascular disease    COPD (chronic obstructive pulmonary disease) ([place])    COVID-19 virus infection 04/2021   Crohn's disease of both small and large intestine with rectal bleeding ([place]) 12/04/2014   Depression    Currently taking zoloft.   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 05/2021 Echo: EF 60-65%, no rwma, nl RV fxn.   History of kidney stones    Hyperlipidemia    Hypertension    Hypothyroidism    Nonobstructive CAD (coronary artery disease)    a. 10/2012 Cath: diffuse minor irregs-->med rx; b. 08/2021 Cor CTA: Ca2+ = 55.6 (77th%'ile), LAD 25p, LCX <25p. No signif non-cardiac findings.  Occlusion, cerebral artery    NOS w/infarction   Osteoporosis    Personal history of radiation therapy 2006   BREAST CA   Pulmonary embolism ([place])    a.04/2021 CTA Chest: Small filling defects are noted in upper and lower lobe branches of L PA-->acute PE-->eliquis. *PE occurred in context of COVID infxn.   Stroke [facility])    Ulcer     Past Surgical History:  Procedure Laterality Date   BREAST BIOPSY Left 2006   POS   BREAST LUMPECTOMY Left 09/20/2004   positive   BREAST SURGERY Left    malignant biopsy   CARDIAC CATHETERIZATION     COLONOSCOPY  2015   COLONOSCOPY WITH PROPOFOL N/A 06/08/2017   Procedure: COLONOSCOPY WITH PROPOFOL;  Surgeon: Lin Landsman, MD;  Location: [place];  Service: Gastroenterology;   Laterality: N/A;   COLONOSCOPY WITH PROPOFOL N/A 03/08/2019   Procedure: COLONOSCOPY WITH PROPOFOL;  Surgeon: Lin Landsman, MD;  Location: [facility] ENDOSCOPY;  Service: Gastroenterology;  Laterality: N/A;   COLONOSCOPY WITH PROPOFOL N/A 07/16/2020   Procedure: COLONOSCOPY WITH PROPOFOL;  Surgeon: Lin Landsman, MD;  Location[facility] ENDOSCOPY;  Service: Gastroenterology;  Laterality: N/A;   ESOPHAGOGASTRODUODENOSCOPY  2015   ESOPHAGOGASTRODUODENOSCOPY (EGD) WITH PROPOFOL N/A 06/08/2017   Procedure: ESOPHAGOGASTRODUODENOSCOPY (EGD) WITH PROPOFOL;  Surgeon: Lin Landsman, MD;  Location: [place];  Service: Gastroenterology;  Laterality: N/A;   ESOPHAGOGASTRODUODENOSCOPY (EGD) WITH PROPOFOL N/A 03/08/2019   Procedure: ESOPHAGOGASTRODUODENOSCOPY (EGD) WITH PROPOFOL;  Surgeon: Lin Landsman, MD;  Location: [facility] ENDOSCOPY;  Service: Gastroenterology;  Laterality: N/A;   FINGER SURGERY     Right small finger   FRACTURE SURGERY     GIVENS CAPSULE STUDY  2015   TUBAL LIGATION      Family Psychiatric History: Please see initial evaluation for full details. I have reviewed the history. No updates at this time.     Family History:  Family History  Problem Relation Age of Onset   Heart disease Mother    Heart attack Mother    Cerebrovascular Accident Father    Arthritis Father    Hypertension Father    Breast cancer Sister    Breast cancer Sister 76   Cancer Sister    Hypertension Other    Diabetes Other    Heart attack Other 2    Social History:  Social History   Socioeconomic History   Marital status: Married    Spouse name: Proofreader   Number of children: 2   Years of education: Not on file   Highest education level: Some college, no degree  Occupational History   Not on file  Tobacco Use   Smoking status: Never   Smokeless tobacco: Never  Vaping Use   Vaping Use: Never used  Substance and Sexual Activity   Alcohol use: No    Alcohol/week: 0.0  standard drinks of alcohol   Drug use: No   Sexual activity: Yes    Partners: Male    Birth control/protection: None  Other Topics Concern   Not on file  Social History Narrative   Married. One son and one daughter. She is disabled after she had breast cancer and strokes. 2 caffeinated beverages daily.   Social Determinants of Health   Financial Resource Strain: Low Risk  (11/06/2019)   Overall Financial Resource Strain (CARDIA)    Difficulty of Paying Living Expenses: Not hard at all  Food Insecurity: No Food Insecurity (11/06/2019)   Hunger Vital Sign  Worried About Charity fundraiser in the Last Year: Never true    [place] in the Last Year: Never true  Transportation Needs: No Transportation Needs (11/06/2019)   PRAPARE - Hydrologist (Medical): No    Lack of Transportation (Non-Medical): No  Physical Activity: Insufficiently Active (11/06/2019)   Exercise Vital Sign    Days of Exercise per Week: 2 days    Minutes of Exercise per Session: 30 min  Stress: No Stress Concern Present (11/06/2019)   [place]    Feeling of Stress : Not at all  Social Connections: Moderately Integrated (11/06/2019)   Social Connection and Isolation Panel [NHANES]    Frequency of Communication with Friends and Family: More than three times a week    Frequency of Social Gatherings with Friends and Family: Three times a week    Attends Religious Services: More than 4 times per year    Active Member of Clubs or Organizations: No    Attends Archivist Meetings: Never    Marital Status: Married    Allergies: No Known Allergies  Metabolic Disorder Labs: No results found for: "HGBA1C", "MPG" No results found for: "PROLACTIN" Lab Results  Component Value Date   CHOL 193 01/29/2021   TRIG 91 01/29/2021   HDL 89 01/29/2021   CHOLHDL 2.2 01/29/2021   LDLCALC 85 01/29/2021   LDLCALC 92  09/03/2020   Lab Results  Component Value Date   TSH 1.05 08/04/2021   TSH 0.73 01/29/2021    Therapeutic Level Labs: No results found for: "LITHIUM" No results found for: "VALPROATE" No results found for: "CBMZ"  Current Medications: Current Outpatient Medications  Medication Sig Dispense Refill   clonazePAM (KLONOPIN) 0.5 MG tablet Take 0.5 mg by mouth at bedtime.     latanoprost (XALATAN) 0.005 % ophthalmic solution Place 1 drop into both eyes at bedtime.      levothyroxine (SYNTHROID) 50 MCG tablet Take 1 tablet (50 mcg total) by mouth daily before breakfast. On empty stomach 90 tablet 0   losartan-hydrochlorothiazide (HYZAAR) 50-12.5 MG tablet Take 1 tablet by mouth daily. 90 tablet 3   Multiple Vitamin (MULTIVITAMIN) tablet Take 1 tablet by mouth daily.     rosuvastatin (CRESTOR) 40 MG tablet Take 1 tablet (40 mg total) by mouth daily. 90 tablet 0   triamcinolone ointment (KENALOG) 0.1 % Apply to affected skin one to two times daily for up to two weeks 30 g 0   valACYclovir (VALTREX) 1000 MG tablet Take 1 tablet (1,000 mg total) by mouth 2 (two) times daily. 180 tablet 3   sertraline (ZOLOFT) 50 MG tablet Take 1 tablet (50 mg total) by mouth daily. 30 tablet 2   No current facility-administered medications for this visit.     Musculoskeletal: Strength & Muscle Tone: within normal limits Gait & Station: normal Patient leans: N/A  Psychiatric Specialty Exam: Review of Systems  Psychiatric/Behavioral:  Positive for decreased concentration, dysphoric mood and sleep disturbance. Negative for agitation, behavioral problems, confusion, hallucinations, self-injury and suicidal ideas. The patient is nervous/anxious. The patient is not hyperactive.   All other systems reviewed and are negative.   Blood pressure 128/70, pulse 71, height 5' (1.524 m), weight 148 lb (67.1 kg).Body mass index is 28.9 kg/m.  General Appearance: Fairly Groomed  Eye Contact:  Good  Speech:  Clear and  Coherent  Volume:  Normal  Mood:   better  Affect:  Appropriate, Congruent, and calm  Thought Process:  Coherent  Orientation:  Full (Time, Place, and Person)  Thought Content: Logical   Suicidal Thoughts:  No  Homicidal Thoughts:  No  Memory:  Immediate;   Good  Judgement:  Good  Insight:  Good  Psychomotor Activity:  Normal  Concentration:  Concentration: Good and Attention Span: Good  Recall:  Good  Fund of Knowledge: Good  Language: Good  Akathisia:  No  Handed:  Right  AIMS (if indicated): not done  Assets:  Communication Skills Desire for Improvement  ADL's:  Intact  Cognition: WNL  Sleep:  Fair   Screenings: GAD-7    Flowsheet Row Office Visit from 06/03/2022 in [place] Office Visit from 04/22/2022 in [place] Office Visit from 01/29/2021 in [facility] Office Visit from 09/03/2020 in [facility] Office Visit from 12/21/2017 in [facility]  Total GAD-7 Score 6 5 0 1 2      PHQ2-9    [place] Visit from 06/03/2022 in [place] Office Visit from 04/22/2022 in [place] Office Visit from 01/01/2022 in [facility] Office Visit from 12/09/2021 in [facility] Office Visit from 11/30/2021 in [facility]  PHQ-2 Total Score 1 2 0 0 1  PHQ-9 Total Score 5 6 0 0 1      [place] ED from 07/01/2021 in [place] ED from 05/05/2021 in [place] No Risk Error: Question 1 not populated        Assessment and Plan:  Carolyn Campbell is a 66 y.o. year old female with a history of  depression, REM sleep behavior disorder, Multifactorial cognitive impairment  history of stroke in 2008, breast caner s/p left lumpectomy in 2006,  prediabetes, mild sleep apnea per sleep study (no CPAP needed), who presents for follow up appointment for below.   1. MDD (major depressive disorder), recurrent episode, mild ([place]) She reports overall improvement in depressive symptoms since switching from duloxetine to sertraline.  Psychosocial stressors includes conflict with her husband, unemployment, and history of stroke.  She prefers to stay at the current dose at this time.  Will continue current dose to target depression.    Plan Continue sertraline 50 mg at night  Next appointment: 9/14 at 1 PM for 30 mins, in person     The patient demonstrates the following risk factors for suicide: Chronic risk factors for suicide include: psychiatric disorder of depression and chronic pain. Acute risk factors for suicide include: family or marital conflict and unemployment. Protective factors for this patient include: positive social support, coping skills, and hope for the future. Considering these factors, the overall suicide risk at this point appears to be low. Patient is appropriate for outpatient follow up.  Although her husband has a gun at home, she does not have access to it.        Collaboration of Care: Collaboration of Care: Other reviewed notes in Epic  Patient/Guardian was advised Release of Information must be obtained prior to any record release in order to collaborate their care with an outside provider. Patient/Guardian was advised if they have not already done so to contact the registration department to sign all necessary forms in order for [place] to release information regarding their care.   Consent: Patient/Guardian gives verbal consent for  treatment and assignment of benefits for services provided during this visit. Patient/Guardian expressed understanding and agreed to proceed.    Norman Clay, MD 06/03/2022, 1:31 PM

## 2022-06-03 ENCOUNTER — Encounter: Payer: Self-pay | Admitting: Psychiatry

## 2022-06-03 ENCOUNTER — Ambulatory Visit (INDEPENDENT_AMBULATORY_CARE_PROVIDER_SITE_OTHER): Payer: Medicare Other | Admitting: Psychiatry

## 2022-06-03 VITALS — BP 128/70 | HR 71 | Ht 60.0 in | Wt 148.0 lb

## 2022-06-03 DIAGNOSIS — F330 Major depressive disorder, recurrent, mild: Secondary | ICD-10-CM

## 2022-06-03 MED ORDER — SERTRALINE HCL 50 MG PO TABS: 50.0000 mg | ORAL_TABLET | Freq: Every day | ORAL | 2 refills | Status: DC

## 2022-06-03 NOTE — Addendum Note (Signed)
Addended by: Norman Clay on: 06/03/2022 01:31 PM   Modules accepted: Level of Service

## 2022-06-24 ENCOUNTER — Ambulatory Visit
Admission: RE | Admit: 2022-06-24 | Discharge: 2022-06-24 | Disposition: A | Discharge status: Home / Self Care | Payer: Medicare Other | Source: Ambulatory Visit | Attending: Family Medicine | Admitting: Family Medicine

## 2022-06-24 DIAGNOSIS — Z1231 Encounter for screening mammogram for malignant neoplasm of breast: Secondary | ICD-10-CM | POA: Diagnosis present

## 2022-07-07 ENCOUNTER — Ambulatory Visit: Payer: Medicare Other

## 2022-07-08 ENCOUNTER — Ambulatory Visit (INDEPENDENT_AMBULATORY_CARE_PROVIDER_SITE_OTHER): Payer: Medicare Other | Admitting: Emergency Medicine

## 2022-07-08 DIAGNOSIS — Z23 Encounter for immunization: Secondary | ICD-10-CM | POA: Diagnosis not present

## 2022-07-16 ENCOUNTER — Emergency Department: Payer: Medicare Other

## 2022-07-16 ENCOUNTER — Observation Stay
Admission: EM | Admit: 2022-07-16 | Discharge: 2022-07-17 | Disposition: A | Discharge status: Home / Self Care | Payer: Medicare Other | Attending: Internal Medicine | Admitting: Internal Medicine

## 2022-07-16 ENCOUNTER — Other Ambulatory Visit: Payer: Self-pay

## 2022-07-16 DIAGNOSIS — M79605 Pain in left leg: Secondary | ICD-10-CM | POA: Diagnosis not present

## 2022-07-16 DIAGNOSIS — K50811 Crohn's disease of both small and large intestine with rectal bleeding: Secondary | ICD-10-CM | POA: Insufficient documentation

## 2022-07-16 DIAGNOSIS — H409 Unspecified glaucoma: Secondary | ICD-10-CM | POA: Insufficient documentation

## 2022-07-16 DIAGNOSIS — M545 Low back pain, unspecified: Secondary | ICD-10-CM | POA: Insufficient documentation

## 2022-07-16 DIAGNOSIS — Z8673 Personal history of transient ischemic attack (TIA), and cerebral infarction without residual deficits: Secondary | ICD-10-CM | POA: Diagnosis not present

## 2022-07-16 DIAGNOSIS — J9811 Atelectasis: Secondary | ICD-10-CM | POA: Diagnosis not present

## 2022-07-16 DIAGNOSIS — E7800 Pure hypercholesterolemia, unspecified: Secondary | ICD-10-CM | POA: Insufficient documentation

## 2022-07-16 DIAGNOSIS — I2699 Other pulmonary embolism without acute cor pulmonale: Secondary | ICD-10-CM | POA: Diagnosis present

## 2022-07-16 DIAGNOSIS — F325 Major depressive disorder, single episode, in full remission: Secondary | ICD-10-CM | POA: Insufficient documentation

## 2022-07-16 DIAGNOSIS — Z853 Personal history of malignant neoplasm of breast: Secondary | ICD-10-CM | POA: Diagnosis not present

## 2022-07-16 DIAGNOSIS — Q159 Congenital malformation of eye, unspecified: Secondary | ICD-10-CM

## 2022-07-16 DIAGNOSIS — J449 Chronic obstructive pulmonary disease, unspecified: Secondary | ICD-10-CM | POA: Insufficient documentation

## 2022-07-16 DIAGNOSIS — H4050X0 Glaucoma secondary to other eye disorders, unspecified eye, stage unspecified: Secondary | ICD-10-CM

## 2022-07-16 DIAGNOSIS — E663 Overweight: Secondary | ICD-10-CM | POA: Diagnosis not present

## 2022-07-16 DIAGNOSIS — E039 Hypothyroidism, unspecified: Secondary | ICD-10-CM | POA: Diagnosis not present

## 2022-07-16 DIAGNOSIS — E785 Hyperlipidemia, unspecified: Secondary | ICD-10-CM | POA: Diagnosis present

## 2022-07-16 DIAGNOSIS — I10 Essential (primary) hypertension: Secondary | ICD-10-CM | POA: Insufficient documentation

## 2022-07-16 DIAGNOSIS — Z79899 Other long term (current) drug therapy: Secondary | ICD-10-CM | POA: Diagnosis not present

## 2022-07-16 DIAGNOSIS — I2510 Atherosclerotic heart disease of native coronary artery without angina pectoris: Secondary | ICD-10-CM | POA: Diagnosis not present

## 2022-07-16 DIAGNOSIS — I700 Atherosclerosis of aorta: Secondary | ICD-10-CM | POA: Diagnosis not present

## 2022-07-16 DIAGNOSIS — Z6825 Body mass index (BMI) 25.0-25.9, adult: Secondary | ICD-10-CM | POA: Insufficient documentation

## 2022-07-16 DIAGNOSIS — R4189 Other symptoms and signs involving cognitive functions and awareness: Secondary | ICD-10-CM | POA: Diagnosis not present

## 2022-07-16 DIAGNOSIS — Z79890 Hormone replacement therapy (postmenopausal): Secondary | ICD-10-CM | POA: Insufficient documentation

## 2022-07-16 DIAGNOSIS — I69919 Unspecified symptoms and signs involving cognitive functions following unspecified cerebrovascular disease: Secondary | ICD-10-CM

## 2022-07-16 LAB — CBC WITH DIFFERENTIAL/PLATELET
Abs Immature Granulocytes: 0.03 10*3/uL (ref 0.00–0.07)
Basophils Absolute: 0.1 10*3/uL (ref 0.0–0.1)
Basophils Relative: 1 %
Eosinophils Absolute: 0.1 10*3/uL (ref 0.0–0.5)
Eosinophils Relative: 1 %
HCT: 38.2 % (ref 36.0–46.0)
Hemoglobin: 12.2 g/dL (ref 12.0–15.0)
Immature Granulocytes: 0 %
Lymphocytes Relative: 30 %
Lymphs Abs: 2.9 10*3/uL (ref 0.7–4.0)
MCH: 28.8 pg (ref 26.0–34.0)
MCHC: 31.9 g/dL (ref 30.0–36.0)
MCV: 90.1 fL (ref 80.0–100.0)
Monocytes Absolute: 0.8 10*3/uL (ref 0.1–1.0)
Monocytes Relative: 8 %
Neutro Abs: 6.1 10*3/uL (ref 1.7–7.7)
Neutrophils Relative %: 60 %
Platelets: 244 10*3/uL (ref 150–400)
RBC: 4.24 MIL/uL (ref 3.87–5.11)
RDW: 13.3 % (ref 11.5–15.5)
WBC: 9.9 10*3/uL (ref 4.0–10.5)
nRBC: 0 % (ref 0.0–0.2)

## 2022-07-16 LAB — BASIC METABOLIC PANEL
Anion gap: 8 (ref 5–15)
BUN: 15 mg/dL (ref 8–23)
CO2: 28 mmol/L (ref 22–32)
Calcium: 9.8 mg/dL (ref 8.9–10.3)
Chloride: 103 mmol/L (ref 98–111)
Creatinine, Ser: 0.96 mg/dL (ref 0.44–1.00)
GFR, Estimated: >60 mL/min (ref >60)
Glucose, Bld: 103 mg/dL — ABNORMAL HIGH (ref 70–99)
Potassium: 3 mmol/L — ABNORMAL LOW (ref 3.5–5.1)
Sodium: 139 mmol/L (ref 135–145)

## 2022-07-16 LAB — TROPONIN I (HIGH SENSITIVITY)
Troponin I (High Sensitivity): 4 ng/L (ref <18)
Troponin I (High Sensitivity): 4 ng/L (ref <18)

## 2022-07-16 LAB — PROTIME-INR
INR: 1 (ref 0.8–1.2)
Prothrombin Time: 13.2 seconds (ref 11.4–15.2)

## 2022-07-16 MED ORDER — SERTRALINE HCL 50 MG PO TABS
50.0000 mg | ORAL_TABLET | Freq: Every day | ORAL | Status: DC
  Administered 2022-07-17: 50 mg via ORAL
  Filled 2022-07-16: qty 1

## 2022-07-16 MED ORDER — SODIUM CHLORIDE 0.9% FLUSH: 3.0000 mL | Freq: Two times a day (BID) | INTRAVENOUS | Status: DC

## 2022-07-16 MED ORDER — POTASSIUM CHLORIDE CRYS ER 20 MEQ PO TBCR
40.0000 meq | EXTENDED_RELEASE_TABLET | Freq: Once | ORAL | Status: AC
  Administered 2022-07-16: 40 meq via ORAL
  Filled 2022-07-16: qty 2

## 2022-07-16 MED ORDER — MORPHINE SULFATE (PF) 2 MG/ML IV SOLN: 2.0000 mg | INTRAVENOUS | Status: DC | PRN

## 2022-07-16 MED ORDER — LOSARTAN POTASSIUM-HCTZ 50-12.5 MG PO TABS: 1.0000 | ORAL_TABLET | Freq: Every day | ORAL | Status: DC

## 2022-07-16 MED ORDER — LATANOPROST 0.005 % OP SOLN
1.0000 [drp] | Freq: Every day | OPHTHALMIC | Status: DC
  Filled 2022-07-16: qty 2.5

## 2022-07-16 MED ORDER — ACETAMINOPHEN 650 MG RE SUPP: 650.0000 mg | Freq: Four times a day (QID) | RECTAL | Status: DC | PRN

## 2022-07-16 MED ORDER — HYDROCODONE-ACETAMINOPHEN 5-325 MG PO TABS: 1.0000 | ORAL_TABLET | ORAL | Status: DC | PRN

## 2022-07-16 MED ORDER — CLONAZEPAM 0.5 MG PO TABS: 0.5000 mg | ORAL_TABLET | Freq: Every day | ORAL | Status: DC

## 2022-07-16 MED ORDER — IOHEXOL 350 MG/ML SOLN
75.0000 mL | Freq: Once | INTRAVENOUS | Status: AC | PRN
  Administered 2022-07-16: 75 mL via INTRAVENOUS

## 2022-07-16 MED ORDER — HEPARIN BOLUS VIA INFUSION
4000.0000 [IU] | Freq: Once | INTRAVENOUS | Status: AC
  Administered 2022-07-16: 4000 [IU] via INTRAVENOUS
  Filled 2022-07-16: qty 4000

## 2022-07-16 MED ORDER — LEVOTHYROXINE SODIUM 50 MCG PO TABS: 50.0000 ug | ORAL_TABLET | Freq: Every day | ORAL | Status: DC

## 2022-07-16 MED ORDER — HEPARIN (PORCINE) 25000 UT/250ML-% IV SOLN
1150.0000 [IU]/h | INTRAVENOUS | Status: DC
  Administered 2022-07-16: 1150 [IU]/h via INTRAVENOUS
  Filled 2022-07-16: qty 250

## 2022-07-16 MED ORDER — ROSUVASTATIN CALCIUM 10 MG PO TABS
40.0000 mg | ORAL_TABLET | Freq: Every day | ORAL | Status: DC
  Filled 2022-07-16: qty 4
  Filled 2022-07-16: qty 2

## 2022-07-16 MED ORDER — ACETAMINOPHEN 325 MG PO TABS: 650.0000 mg | ORAL_TABLET | Freq: Four times a day (QID) | ORAL | Status: DC | PRN

## 2022-07-16 MED ORDER — SODIUM CHLORIDE 0.9 % IV SOLN: INTRAVENOUS | Status: DC

## 2022-07-16 NOTE — ED Provider Notes (Signed)
[facility] Provider Note    Event Date/Time   First MD Initiated Contact with Patient 07/16/22 1731     (approximate)   History   Chest Pain   HPI  Carolyn Campbell is a 66 y.o. female  who presents to the emergency department today because of concern for chest pain. Started yesterday. Primarily on the left side. Was sharp yesterday, today feels more like a tightness. Was worse with deep breaths.  The patient states she has history of PE that was related to covid. She says she is not on any blood thinning medication. The patient denies any fevers.       Physical Exam   Triage Vital Signs: ED Triage Vitals  Enc Vitals Group     BP 07/16/22 1618 (!) 134/56     Pulse Rate 07/16/22 1618 76     Resp 07/16/22 1618 16     Temp 07/16/22 1618 98.5 F (36.9 C)     Temp Source 07/16/22 1618 Oral     SpO2 07/16/22 1618 97 %     Weight 07/16/22 1619 148 lb (67.1 kg)     Height --      Head Circumference --      Peak Flow --      Pain Score 07/16/22 1619 4     Pain Loc --      Pain Edu? --      Excl. in [place]? --     Most recent vital signs: Vitals:   07/16/22 1618  BP: (!) 134/56  Pulse: 76  Resp: 16  Temp: 98.5 F (36.9 C)  SpO2: 97%   General: Awake, alert, oriented. CV:  Good peripheral perfusion. Regular rate and rhythm Resp:  Normal effort. Lungs clear. Abd:  No distention.     ED Results / Procedures / Treatments   Labs (all labs ordered are listed, but only abnormal results are displayed) Labs Reviewed  BASIC METABOLIC PANEL - Abnormal; Notable for the following components:      Result Value   Potassium 3.0 (*)    Glucose, Bld 103 (*)    All other components within normal limits  CBC WITH DIFFERENTIAL/PLATELET  APTT  PROTIME-INR  TROPONIN I (HIGH SENSITIVITY)  TROPONIN I (HIGH SENSITIVITY)     EKG  I, Nance Pear, attending physician, personally viewed and interpreted this EKG  EKG Time: 1616 Rate: 77 Rhythm:  normal sinus rhythm Axis: normal Intervals: qtc 416 QRS: narrow ST changes: no st elevation Impression: normal ekg  RADIOLOGY I independently interpreted and visualized the CXR. My interpretation: No pneumonia. Radiology interpretation:  IMPRESSION:  No active cardiopulmonary disease.   I independently interpreted and visualized the CT angio PE. My interpretation: Right sided PE Radiology interpretation:  IMPRESSION:  1. Small amount of pulmonary embolism within a lower lobe branch of  the right pulmonary artery.  2. Mild right heart strain (RV/LV ratio of 1.04).  3. Mild bilateral lower lobe atelectasis.  4. Aortic atherosclerosis.    Aortic Atherosclerosis (ICD10-I70.0).   I, Nance Pear, personally discussed these images (CT angio) and results by phone with the on-call radiologist and used this discussion as part of my medical decision making.      PROCEDURES:  Critical Care performed: No  Procedures   MEDICATIONS ORDERED IN ED: Medications - No data to display   IMPRESSION / MDM / [place] / ED COURSE  I reviewed the triage vital signs and the nursing notes.  Differential diagnosis includes, but is not limited to, PE, pneumonia, pneumothorax.  Patient's presentation is most consistent with acute presentation with potential threat to life or bodily function.  The patient is on the cardiac monitor to evaluate for evidence of arrhythmia and/or significant heart rate changes.   Patient presented to the emergency department today because of concerns for chest pain.  Patient with history of PE.  Patient without any hypoxia here in the emergency department.  Chest x-ray without any pneumonia or pneumothorax.  Did obtain a CT angio which was positive for PE.  Additionally there was some concern for heart strain on the CT scan.  Troponin however was negative.  Given PE will start IV heparin.  Discussed with Dr. Posey Pronto with the  hospitalist service who will plan on admission.   FINAL CLINICAL IMPRESSION(S) / ED DIAGNOSES   Final diagnoses:  Pulmonary embolism, other, unspecified chronicity, unspecified whether acute cor pulmonale present [facility])     Note:  This document was prepared using Dragon voice recognition software and may include unintentional dictation errors.    Nance Pear, MD 07/16/22 2141

## 2022-07-16 NOTE — ED Triage Notes (Signed)
Pt arrives with c/o chest pain that started last night. Pt denies n/v or fevers. Pt endorses SOB. Pt has hx of PE. Pt does not take blood thinners.

## 2022-07-16 NOTE — Assessment & Plan Note (Signed)
Latanoprost 1 drop both eyes.

## 2022-07-16 NOTE — Assessment & Plan Note (Signed)
Cont statin and hyzaar.

## 2022-07-16 NOTE — H&P (Signed)
History and Physical    Chief Complaint: Chest pain   HISTORY OF PRESENT ILLNESS: Carolyn Campbell is an 66 y.o. female   C/P started yesterday and did not sleep well.  It woke her up from sleep and then decided to come to hospital . Pt has PE after covid per report and pt has seen Dr.Dew and was on anticoagulants for 4 months . Duration: 1 day   Frequency: intermittent    Location: left side .  Quality: tightness  Rate: 4/10   Radiation:radiates to his back   Aggravating: movement   Alleviating:N/A  Associated factors:None    Pt has PMH as below: Past Medical History:  Diagnosis Date   Abdominal pain, epigastric    Allergic rhinitis    Arthritis    Asthma    Breast cancer ([place]) 2006   LT LUMPECTOMY   Clotting disorder ([place])    Cognitive deficits as late effect of cerebrovascular disease    COPD (chronic obstructive pulmonary disease) ([place])    COVID-19 virus infection 04/2021   Crohn's disease ([place]) 05/21/2015   FOLLOWED BY GI   Crohn's disease of both small and large intestine with rectal bleeding ([place]) 12/04/2014   Depression    Currently taking zoloft.   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 05/2021 Echo: EF 60-65%, no rwma, nl RV fxn.   History of kidney stones    Hyperlipidemia    Hypertension    Hypothyroidism    Nonobstructive CAD (coronary artery disease)    a. 10/2012 Cath: diffuse minor irregs-->med rx; b. 08/2021 Cor CTA: Ca2+ = 55.6 (77th%'ile), LAD 25p, LCX <25p. No signif non-cardiac findings.   Occlusion, cerebral artery    NOS w/infarction   Osteoporosis    Personal history of radiation therapy 2006   BREAST CA   Pulmonary embolism ([place])    a.04/2021 CTA Chest: Small filling defects are noted in upper and lower lobe branches of L PA-->acute PE-->eliquis. *PE  occurred in context of COVID infxn.   Stroke [facility])    Ulcer     Review of Systems  Cardiovascular:  Positive for chest pain.  Musculoskeletal:        Left leg hurting 2 weeks ago.    All other systems reviewed and are negative.    No Known Allergies  Past Surgical History:  Procedure Laterality Date   BREAST BIOPSY Left 2006   POS   BREAST LUMPECTOMY Left 09/20/2004   positive   BREAST SURGERY Left    malignant biopsy   CARDIAC CATHETERIZATION     COLONOSCOPY  2015   COLONOSCOPY WITH PROPOFOL N/A 06/08/2017   Procedure: COLONOSCOPY WITH PROPOFOL;  Surgeon: Lin Landsman, MD;  Location: [place];  Service: Gastroenterology;  Laterality: N/A;   COLONOSCOPY WITH PROPOFOL N/A 03/08/2019   Procedure: COLONOSCOPY WITH PROPOFOL;  Surgeon: Lin Landsman, MD;  Location: [facility] ENDOSCOPY;  Service: Gastroenterology;  Laterality: N/A;   COLONOSCOPY WITH PROPOFOL N/A 07/16/2020   Procedure: COLONOSCOPY WITH PROPOFOL;  Surgeon: Lin Landsman, MD;  Location: [facility] ENDOSCOPY;  Service: Gastroenterology;  Laterality: N/A;   ESOPHAGOGASTRODUODENOSCOPY  2015   ESOPHAGOGASTRODUODENOSCOPY (EGD) WITH PROPOFOL N/A 06/08/2017   Procedure: ESOPHAGOGASTRODUODENOSCOPY (EGD) WITH PROPOFOL;  Surgeon: Lin Landsman, MD;  Location: [facility];  Service: Gastroenterology;  Laterality: N/A;   ESOPHAGOGASTRODUODENOSCOPY (EGD) WITH PROPOFOL N/A 03/08/2019   Procedure: ESOPHAGOGASTRODUODENOSCOPY (EGD) WITH PROPOFOL;  Surgeon: Lin Landsman, MD;  Location: [facility] ENDOSCOPY;  Service: Gastroenterology;  Laterality: N/A;   FINGER SURGERY     Right small finger   FRACTURE SURGERY     GIVENS CAPSULE STUDY  2015   TUBAL LIGATION       Social History   Socioeconomic History   Marital status: Married    Spouse name: javis   Number of children: 2   Years of education: Not on file   Highest education level: Some college, no degree  Occupational History   Not on file   Tobacco Use   Smoking status: Never   Smokeless tobacco: Never  Vaping Use   Vaping Use: Never used  Substance and Sexual Activity   Alcohol use: No    Alcohol/week: 0.0 standard drinks of alcohol   Drug use: No   Sexual activity: Yes    Partners: Male    Birth control/protection: None  Other Topics Concern   Not on file  Social History Narrative   Married. One son and one daughter. She is disabled after she had breast cancer and strokes. 2 caffeinated beverages daily.   Social Determinants of Health   Financial Resource Strain: Low Risk  (11/06/2019)   Overall Financial Resource Strain (CARDIA)    Difficulty of Paying Living Expenses: Not hard at all  Food Insecurity: No Food Insecurity (11/06/2019)   Hunger Vital Sign    Worried About Running Out of Food in the Last Year: Never true    Ran Out of Food in the Last Year: Never true  Transportation Needs: No Transportation Needs (11/06/2019)   PRAPARE - Hydrologist (Medical): No    Lack of Transportation (Non-Medical): No  Physical Activity: Insufficiently Active (11/06/2019)   Exercise Vital Sign    Days of Exercise per Week: 2 days    Minutes of Exercise per Session: 30 min  Stress: No Stress Concern Present (11/06/2019)   [place]    Feeling of Stress : Not at all  Social Connections: Moderately Integrated (11/06/2019)   Social Connection and Isolation Panel [NHANES]    Frequency of Communication with Friends and Family: More than three times a week    Frequency of Social Gatherings with Friends and Family: Three times a week    Attends Religious Services: More than 4 times per year    Active Member of Clubs or Organizations: No    Attends Archivist Meetings: Never    Marital Status: Married      CURRENT MEDS:  Current Facility-Administered Medications (Endocrine & Metabolic):    [START ON 58/05/9832]  levothyroxine (SYNTHROID) tablet 50 mcg  Current Outpatient Medications (Endocrine & Metabolic):    levothyroxine (SYNTHROID) 50 MCG tablet, Take 1 tablet (50 mcg total) by mouth daily before breakfast. On empty stomach  Current Facility-Administered Medications (Cardiovascular):    losartan-hydrochlorothiazide (HYZAAR) 50-12.5 MG per tablet 1 tablet   rosuvastatin (CRESTOR) tablet 40 mg  Current Outpatient Medications (Cardiovascular):    losartan-hydrochlorothiazide (HYZAAR) 50-12.5 MG tablet, Take 1 tablet by mouth daily.   rosuvastatin (CRESTOR) 40 MG tablet, Take 1 tablet (40 mg total) by mouth daily.    Current Facility-Administered Medications (Analgesics):    acetaminophen (TYLENOL) tablet 650 mg **OR** acetaminophen (TYLENOL) suppository 650 mg   HYDROcodone-acetaminophen (NORCO/VICODIN) 5-325  MG per tablet 1 tablet   morphine (PF) 2 MG/ML injection 2 mg   Current Facility-Administered Medications (Hematological):    heparin ADULT infusion 100 units/mL (25000 units/254m)   Current Facility-Administered Medications (Other):    0.9 %  sodium chloride infusion   clonazePAM (KLONOPIN) tablet 0.5 mg   latanoprost (XALATAN) 0.005 % ophthalmic solution 1 drop   sertraline (ZOLOFT) tablet 50 mg   sodium chloride flush (NS) 0.9 % injection 3 mL  Current Outpatient Medications (Other):    clonazePAM (KLONOPIN) 0.5 MG tablet, Take 0.5 mg by mouth at bedtime.   latanoprost (XALATAN) 0.005 % ophthalmic solution, Place 1 drop into both eyes at bedtime.    Multiple Vitamin (MULTIVITAMIN) tablet, Take 1 tablet by mouth daily.   sertraline (ZOLOFT) 50 MG tablet, Take 1 tablet (50 mg total) by mouth daily.   triamcinolone ointment (KENALOG) 0.1 %, Apply to affected skin one to two times daily for up to two weeks   valACYclovir (VALTREX) 1000 MG tablet, Take 1 tablet (1,000 mg total) by mouth 2 (two) times daily.    ED Course: Pt in Ed pt is A/O gives history.  Vitals:   07/16/22  1930 07/16/22 2200 07/16/22 2208 07/16/22 2209  BP: 139/65 (!) 177/71    Pulse: 64 65    Resp: 19 12    Temp:   98.3 F (36.8 C) 98.7 F (37.1 C)  TempSrc:   Oral Oral  SpO2: 96% 100%    Weight:       No intake/output data recorded. SpO2: 100 % Blood work in ed shows: Normal cbc, low potassium/ HTN/ Hypothyroidism / Crestor and depression.   Results for orders placed or performed during the hospital encounter of 07/16/22 (from the past 48 hour(s))  CBC with Differential     Status: None   Collection Time: 07/16/22  4:20 PM  Result Value Ref Range   WBC 9.9 4.0 - 10.5 K/uL   RBC 4.24 3.87 - 5.11 MIL/uL   Hemoglobin 12.2 12.0 - 15.0 g/dL   HCT 38.2 36.0 - 46.0 %   MCV 90.1 80.0 - 100.0 fL   MCH 28.8 26.0 - 34.0 pg   MCHC 31.9 30.0 - 36.0 g/dL   RDW 13.3 11.5 - 15.5 %   Platelets 244 150 - 400 K/uL   nRBC 0.0 0.0 - 0.2 %   Neutrophils Relative % 60 %   Neutro Abs 6.1 1.7 - 7.7 K/uL   Lymphocytes Relative 30 %   Lymphs Abs 2.9 0.7 - 4.0 K/uL   Monocytes Relative 8 %   Monocytes Absolute 0.8 0.1 - 1.0 K/uL   Eosinophils Relative 1 %   Eosinophils Absolute 0.1 0.0 - 0.5 K/uL   Basophils Relative 1 %   Basophils Absolute 0.1 0.0 - 0.1 K/uL   Immature Granulocytes 0 %   Abs Immature Granulocytes 0.03 0.00 - 0.07 K/uL    Comment: [facility] [address] [place] [id number] Basic metabolic panel     Status: Abnormal   Collection Time: 07/16/22  4:20 PM  Result Value Ref Range   Sodium 139 135 - 145 mmol/L   Potassium 3.0 (L) 3.5 - 5.1 mmol/L   Chloride 103 98 - 111 mmol/L   CO2 28 22 - 32 mmol/L   Glucose, Bld 103 (H) 70 - 99 mg/dL    Comment: Glucose reference range applies only to samples taken after fasting for at least 8 hours.  BUN 15 8 - 23 mg/dL   Creatinine, Ser 0.96 0.44 - 1.00 mg/dL   Calcium 9.8 8.9 - 10.3 mg/dL   GFR, Estimated >60 >60 mL/min    Comment: (NOTE) Calculated using the CKD-EPI Creatinine Equation (2021)     Anion gap 8 5 - 15    Comment: Performed at [facility], [address] [place], [place] [address]  Troponin I (High Sensitivity)     Status: None   Collection Time: 07/16/22  4:20 PM  Result Value Ref Range   Troponin I (High Sensitivity) 4 <18 ng/L    Comment: (NOTE) Elevated high sensitivity troponin I (hsTnI) values and significant  changes across serial measurements may suggest ACS but many other  chronic and acute conditions are known to elevate hsTnI results.  Refer to the "Links" section for chest pain algorithms and additional  guidance. Performed at [facility], [address] [place], [place] [address]   Protime-INR     Status: None   Collection Time: 07/16/22  4:22 PM  Result Value Ref Range   Prothrombin Time 13.2 11.4 - 15.2 seconds   INR 1.0 0.8 - 1.2    Comment: (NOTE) INR goal varies based on device and disease states. Performed at [facility], [place], [place] [address]   Troponin I (High Sensitivity)     Status: None   Collection Time: 07/16/22  6:20 PM  Result Value Ref Range   Troponin I (High Sensitivity) 4 <18 ng/L    Comment: (NOTE) Elevated high sensitivity troponin I (hsTnI) values and significant  changes across serial measurements may suggest ACS but many other  chronic and acute conditions are known to elevate hsTnI results.  Refer to the "Links" section for chest pain algorithms and additional  guidance. Performed at [facility], [address] [place], [place] [address]     In Ed pt received  Meds ordered this encounter  Medications   iohexol (OMNIPAQUE) 350 MG/ML injection 75 mL   potassium chloride SA (KLOR-CON M) CR tablet 40 mEq   heparin bolus via infusion 4,000 Units   heparin ADULT infusion 100 units/mL (25000 units/270m)   levothyroxine (SYNTHROID) tablet 50 mcg    On empty stomach     latanoprost (XALATAN) 0.005 % ophthalmic solution 1 drop   clonazePAM (KLONOPIN)  tablet 0.5 mg   losartan-hydrochlorothiazide (HYZAAR) 50-12.5 MG per tablet 1 tablet   rosuvastatin (CRESTOR) tablet 40 mg   sertraline (ZOLOFT) tablet 50 mg   sodium chloride flush (NS) 0.9 % injection 3 mL   0.9 %  sodium chloride infusion   OR Linked Order Group    acetaminophen (TYLENOL) tablet 650 mg    acetaminophen (TYLENOL) suppository 650 mg   HYDROcodone-acetaminophen (NORCO/VICODIN) 5-325 MG per tablet 1 tablet   morphine (PF) 2 MG/ML injection 2 mg    Unresulted Labs (From admission, onward)     Start     Ordered   07/17/22 0500  Comprehensive metabolic panel  Tomorrow morning,   STAT        07/16/22 2153   07/17/22 0500  CBC  Tomorrow morning,   STAT        07/16/22 2153   07/17/22 0500  HIV Antibody (routine testing w rflx)  Once,   R        07/17/22 0500   07/17/22 0400  Heparin level (unfractionated)  Once-Timed,   R        07/16/22  2205   07/16/22 2200  APTT  Once,   AD        07/16/22 2200            Admission Imaging : CT Angio Chest PE W and/or Wo Contrast  Result Date: 07/16/2022 CLINICAL DATA:  Chest pain. EXAM: CT ANGIOGRAPHY CHEST WITH CONTRAST TECHNIQUE: Multidetector CT imaging of the chest was performed using the standard protocol during bolus administration of intravenous contrast. Multiplanar CT image reconstructions and MIPs were obtained to evaluate the vascular anatomy. RADIATION DOSE REDUCTION: This exam was performed according to the departmental dose-optimization program which includes automated exposure control, adjustment of the mA and/or kV according to patient size and/or use of iterative reconstruction technique. CONTRAST:  16m OMNIPAQUE IOHEXOL 350 MG/ML SOLN COMPARISON:  August 20, 2021 FINDINGS: Cardiovascular: There is moderate to marked severity calcification of the thoracic aorta, without evidence of aortic aneurysm. A small amount of intraluminal low attenuation is seen within a lower lobe branch of the right pulmonary artery (axial  CT image 41, CT series 4). Normal heart size. Mild right heart strain is seen (RV/LV ratio of 1.04). No pericardial effusion. Mediastinum/Nodes: No enlarged mediastinal, hilar, or axillary lymph nodes. Thyroid gland, trachea, and esophagus demonstrate no significant findings. Lungs/Pleura: Mild atelectasis is seen within the posterior aspect of the bilateral lower lobes. There is no evidence of acute infiltrate, pleural effusion or pneumothorax. Upper Abdomen: No acute abnormality. Musculoskeletal: No chest wall abnormality. No acute or significant osseous findings. Review of the MIP images confirms the above findings. IMPRESSION: 1. Small amount of pulmonary embolism within a lower lobe branch of the right pulmonary artery. 2. Mild right heart strain (RV/LV ratio of 1.04). 3. Mild bilateral lower lobe atelectasis. 4. Aortic atherosclerosis. Aortic Atherosclerosis (ICD10-I70.0). Electronically Signed   By: TVirgina NorfolkM.D.   On: 07/16/2022 21:16   DG Chest 2 View  Result Date: 07/16/2022 CLINICAL DATA:  Chest pain EXAM: CHEST - 2 VIEW COMPARISON:  Chest x-ray 07/01/2021 FINDINGS: The heart size and mediastinal contours are within normal limits. Both lungs are clear. The visualized skeletal structures are unremarkable. IMPRESSION: No active cardiopulmonary disease. Electronically Signed   By: ARonney AstersM.D.   On: 07/16/2022 16:42      Physical Examination: Vitals:   07/16/22 1930 07/16/22 2200 07/16/22 2208 07/16/22 2209  BP: 139/65 (!) 177/71    Pulse: 64 65    Temp:   98.3 F (36.8 C) 98.7 F (37.1 C)  Resp: 19 12    Weight:      SpO2: 96% 100%    TempSrc:   Oral Oral   Physical Exam Vitals and nursing note reviewed.  Constitutional:      General: She is not in acute distress.    Appearance: Normal appearance. She is not ill-appearing, toxic-appearing or diaphoretic.  HENT:     Head: Normocephalic and atraumatic.     Right Ear: Hearing and external ear normal.     Left Ear:  Hearing and external ear normal.     Nose: Nose normal. No nasal deformity.     Mouth/Throat:     Lips: Pink.     Mouth: Mucous membranes are moist.     Tongue: No lesions.     Pharynx: Oropharynx is clear.  Eyes:     Extraocular Movements: Extraocular movements intact.     Pupils: Pupils are equal, round, and reactive to light.  Neck:     Vascular: No carotid bruit.  Cardiovascular:  Rate and Rhythm: Normal rate and regular rhythm.     Pulses: Normal pulses.     Heart sounds: Normal heart sounds.  Pulmonary:     Effort: Pulmonary effort is normal.     Breath sounds: Normal breath sounds.  Abdominal:     General: Bowel sounds are normal. There is no distension.     Palpations: Abdomen is soft. There is no mass.     Tenderness: There is no abdominal tenderness. There is no guarding.     Hernia: No hernia is present.  Musculoskeletal:     Right lower leg: No edema.     Left lower leg: No edema.  Skin:    General: Skin is warm.  Neurological:     General: No focal deficit present.     Mental Status: She is alert and oriented to person, place, and time.     Cranial Nerves: Cranial nerves 2-12 are intact.     Motor: Motor function is intact.  Psychiatric:        Attention and Perception: Attention normal.        Mood and Affect: Mood normal.        Speech: Speech normal.        Behavior: Behavior normal. Behavior is cooperative.        Cognition and Memory: Cognition normal.     Assessment and Plan: * Pulmonary embolism ([place]) ? If pt has has underlying hypercoagulable etiology.  Continue heparin gtt.  2 D Echo. Oxygen 2 L [place].  EKG shows incomplete RBBB. TNI negative.    Hypothyroidism Continue levothyroxine 50 MCG.   Glaucoma associated with chamber angle anomalies Latanoprost 1 drop both eyes.    Benign essential HTN Vitals:   07/16/22 1618 07/16/22 1930 07/16/22 2200  BP: (!) 134/56 139/65 (!) 177/71  Continue Hyzaar.   Low back pain radiating to left  leg We will get doppler or LE.    Nonobstructive CAD (coronary artery disease) Cont statin and hyzaar.     DVT prophylaxis:  Heparin gtt.   Code Status:  Full Code    Family Communication:  Mirkin,[name] (Spouse)  [phone]   Disposition Plan:  Home    Consults called:  None   Admission status: Inpatient    Unit/ Expected LOS: Progressive / 2 days.    Para Skeans MD [facility]  6 PM- 2 AM. Please contact me via secure Chat 6 PM-2 AM. [phone] ( Pager ) To contact the [facility] Attending or Consulting provider [place] or covering provider during after hours [place], for this patient.   Check the care team in [facility] and look for a) attending/consulting [facility] provider listed and b) the [facility] team listed Log into [email or web address] and use [place]'s universal password to access. If you do not have the password, please contact the hospital operator. Locate the [facility] provider you are looking for under [facility] and page to a number that you can be directly reached. If you still have difficulty reaching the provider, please page the [facility] (Director on Call) for the Hospitalists listed on amion for assistance. [email or web address] 07/16/2022, 10:27 PM

## 2022-07-16 NOTE — Assessment & Plan Note (Addendum)
Blood pressure stable during hospitalization, continued on home medications

## 2022-07-16 NOTE — Assessment & Plan Note (Signed)
Continue levothyroxine 50 MCG.

## 2022-07-16 NOTE — Assessment & Plan Note (Signed)
We will get doppler or LE.

## 2022-07-16 NOTE — Assessment & Plan Note (Addendum)
Had extensive discussion with patient.  Does not appear to be related to her previous PE which appeared to be a complication from [place].  When asked about long trips, patient had taken a bus ride from [place] to [place] 6 to 8 weeks ago.  I suspect that this may have been the etiology of her embolus.  Fortunately, given patient's stability with no evidence of hypoxia, further pain or cardiovascular compromise, echocardiogram canceled.  Patient initially on heparin infusion which was changed over to Eliquis with first dose given before discharge.  She will be on 10 mg twice a day for 1 week and then 5 mg p.o. twice daily for the next 6 months.  Family also present.

## 2022-07-16 NOTE — Progress Notes (Signed)
ANTICOAGULATION CONSULT NOTE - Initial Consult  Pharmacy Consult for Heparin Infusion Indication: pulmonary embolus  No Known Allergies  Patient Measurements: Weight: 67.1 kg (148 lb) Heparin Dosing Weight: 67.1 kg  Vital Signs: Temp: 98.5 F (36.9 C) (10/27 1618) Temp Source: Oral (10/27 1618) BP: 139/65 (10/27 1930) Pulse Rate: 64 (10/27 1930)  Labs: Recent Labs    07/16/22 1620 07/16/22 1820  HGB 12.2  --   HCT 38.2  --   PLT 244  --   CREATININE 0.96  --   TROPONINIHS 4 4    Estimated Creatinine Clearance: 47.9 mL/min (by C-G formula based on SCr of 0.96 mg/dL).   Medical History: Past Medical History:  Diagnosis Date   Abdominal pain, epigastric    Allergic rhinitis    Arthritis    Asthma    Breast cancer ([place]) 2006   LT LUMPECTOMY   Clotting disorder ([place])    Cognitive deficits as late effect of cerebrovascular disease    COPD (chronic obstructive pulmonary disease) ([place])    COVID-19 virus infection 04/2021   Crohn's disease of both small and large intestine with rectal bleeding ([place]) 12/04/2014   Depression    Currently taking zoloft.   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 05/2021 Echo: EF 60-65%, no rwma, nl RV fxn.   History of kidney stones    Hyperlipidemia    Hypertension    Hypothyroidism    Nonobstructive CAD (coronary artery disease)    a. 10/2012 Cath: diffuse minor irregs-->med rx; b. 08/2021 Cor CTA: Ca2+ = 55.6 (77th%'ile), LAD 25p, LCX <25p. No signif non-cardiac findings.   Occlusion, cerebral artery    NOS w/infarction   Osteoporosis    Personal history of radiation therapy 2006   BREAST CA   Pulmonary embolism ([place])    a.04/2021 CTA Chest: Small filling defects are noted in upper and lower lobe branches of L PA-->acute PE-->eliquis. *PE occurred in  context of COVID infxn.   Stroke [facility])    Ulcer    Assessment: Patient is a 66yo female admitted for chest pain. CT reveals PE with some concern for heart strain. Pharmacy consulted for Heparin dosing. No anticoagulants noted in medication history.  Goal of Therapy:  Heparin level 0.3-0.7 units/ml Monitor platelets by anticoagulation protocol: Yes   Plan:  Give 4000 units bolus x 1 Start heparin infusion at 1150 units/hr Check anti-Xa level in 6 hours and daily while on heparin Continue to monitor H&H and platelets  Paulina Fusi, PharmD, BCPS 07/16/2022 9:53 PM

## 2022-07-17 ENCOUNTER — Other Ambulatory Visit: Payer: Medicare Other

## 2022-07-17 DIAGNOSIS — I10 Essential (primary) hypertension: Secondary | ICD-10-CM | POA: Diagnosis not present

## 2022-07-17 DIAGNOSIS — R4182 Altered mental status, unspecified: Secondary | ICD-10-CM | POA: Diagnosis not present

## 2022-07-17 DIAGNOSIS — E038 Other specified hypothyroidism: Secondary | ICD-10-CM

## 2022-07-17 DIAGNOSIS — E663 Overweight: Secondary | ICD-10-CM

## 2022-07-17 DIAGNOSIS — I2699 Other pulmonary embolism without acute cor pulmonale: Secondary | ICD-10-CM | POA: Diagnosis not present

## 2022-07-17 DIAGNOSIS — I69919 Unspecified symptoms and signs involving cognitive functions following unspecified cerebrovascular disease: Secondary | ICD-10-CM

## 2022-07-17 DIAGNOSIS — G9341 Metabolic encephalopathy: Secondary | ICD-10-CM | POA: Diagnosis not present

## 2022-07-17 HISTORY — DX: Overweight: E66.3

## 2022-07-17 LAB — COMPREHENSIVE METABOLIC PANEL
ALT: 14 U/L (ref 0–44)
AST: 18 U/L (ref 15–41)
Albumin: 3.8 g/dL (ref 3.5–5.0)
Alkaline Phosphatase: 65 U/L (ref 38–126)
Anion gap: 5 (ref 5–15)
BUN: 12 mg/dL (ref 8–23)
CO2: 30 mmol/L (ref 22–32)
Calcium: 9.3 mg/dL (ref 8.9–10.3)
Chloride: 105 mmol/L (ref 98–111)
Creatinine, Ser: 0.72 mg/dL (ref 0.44–1.00)
GFR, Estimated: >60 mL/min (ref >60)
Glucose, Bld: 110 mg/dL — ABNORMAL HIGH (ref 70–99)
Potassium: 3.6 mmol/L (ref 3.5–5.1)
Sodium: 140 mmol/L (ref 135–145)
Total Bilirubin: 0.8 mg/dL (ref 0.3–1.2)
Total Protein: 7.2 g/dL (ref 6.5–8.1)

## 2022-07-17 LAB — CBC
HCT: 36 % (ref 36.0–46.0)
Hemoglobin: 11.5 g/dL — ABNORMAL LOW (ref 12.0–15.0)
MCH: 29 pg (ref 26.0–34.0)
MCHC: 31.9 g/dL (ref 30.0–36.0)
MCV: 90.9 fL (ref 80.0–100.0)
Platelets: 227 10*3/uL (ref 150–400)
RBC: 3.96 MIL/uL (ref 3.87–5.11)
RDW: 12.9 % (ref 11.5–15.5)
WBC: 8 10*3/uL (ref 4.0–10.5)
nRBC: 0 % (ref 0.0–0.2)

## 2022-07-17 LAB — HEPARIN LEVEL (UNFRACTIONATED)
Heparin Unfractionated: 0.57 IU/mL (ref 0.30–0.70)
Heparin Unfractionated: >1.1 IU/mL — ABNORMAL HIGH (ref 0.30–0.70)

## 2022-07-17 LAB — HIV ANTIBODY (ROUTINE TESTING W REFLEX): HIV Screen 4th Generation wRfx: NONREACTIVE

## 2022-07-17 LAB — APTT: aPTT: 70 seconds — ABNORMAL HIGH (ref 24–36)

## 2022-07-17 MED ORDER — ELIQUIS DVT/PE STARTER PACK 5 MG PO TBPK: ORAL_TABLET | ORAL | 0 refills | Status: DC

## 2022-07-17 MED ORDER — APIXABAN 5 MG PO TABS: 5.0000 mg | ORAL_TABLET | Freq: Two times a day (BID) | ORAL | Status: DC

## 2022-07-17 MED ORDER — HYDROCHLOROTHIAZIDE 12.5 MG PO TABS
12.5000 mg | ORAL_TABLET | Freq: Every day | ORAL | Status: DC
  Administered 2022-07-17: 12.5 mg via ORAL
  Filled 2022-07-17: qty 1

## 2022-07-17 MED ORDER — LOSARTAN POTASSIUM 50 MG PO TABS
50.0000 mg | ORAL_TABLET | Freq: Every day | ORAL | Status: DC
  Administered 2022-07-17: 50 mg via ORAL
  Filled 2022-07-17: qty 1

## 2022-07-17 MED ORDER — APIXABAN 5 MG PO TABS
10.0000 mg | ORAL_TABLET | Freq: Two times a day (BID) | ORAL | Status: DC
  Administered 2022-07-17: 10 mg via ORAL
  Filled 2022-07-17: qty 2

## 2022-07-17 MED ORDER — APIXABAN 5 MG PO TABS: 5.0000 mg | ORAL_TABLET | Freq: Two times a day (BID) | ORAL | 4 refills | Status: DC

## 2022-07-17 NOTE — ED Notes (Signed)
Elie Goody RN aware of assigned bed

## 2022-07-17 NOTE — Progress Notes (Signed)
ANTICOAGULATION CONSULT NOTE - Initial Consult  Pharmacy Consult for Heparin Infusion Indication: pulmonary embolus  No Known Allergies  Patient Measurements: Weight: 67.1 kg (148 lb) Heparin Dosing Weight: 67.1 kg  Vital Signs: Temp: 98.4 F (36.9 C) (10/28 0500) Temp Source: Oral (10/28 0500) BP: 139/71 (10/28 0500) Pulse Rate: 53 (10/28 0500)  Labs: Recent Labs    07/16/22 1620 07/16/22 1622 07/16/22 1820 07/17/22 0400  HGB 12.2  --   --  11.5*  HCT 38.2  --   --  36.0  PLT 244  --   --  227  APTT  --   --   --  70*  LABPROT  --  13.2  --   --   INR  --  1.0  --   --   HEPARINUNFRC  --   --   --  0.57  CREATININE 0.96  --   --  0.72  TROPONINIHS 4  --  4  --      Estimated Creatinine Clearance: 57.5 mL/min (by C-G formula based on SCr of 0.72 mg/dL).   Medical History: Past Medical History:  Diagnosis Date   Abdominal pain, epigastric    Allergic rhinitis    Arthritis    Asthma    Breast cancer ([place]) 2006   LT LUMPECTOMY   Clotting disorder ([place])    Cognitive deficits as late effect of cerebrovascular disease    COPD (chronic obstructive pulmonary disease) ([place])    COVID-19 virus infection 04/2021   Crohn's disease ([place]) 05/21/2015   FOLLOWED BY GI   Crohn's disease of both small and large intestine with rectal bleeding ([place]) 12/04/2014   Depression    Currently taking zoloft.   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 05/2021 Echo: EF 60-65%, no rwma, nl RV fxn.   History of kidney stones    Hyperlipidemia    Hypertension    Hypothyroidism    Nonobstructive CAD (coronary artery disease)    a. 10/2012 Cath: diffuse minor irregs-->med rx; b. 08/2021 Cor CTA: Ca2+ = 55.6 (77th%'ile), LAD 25p, LCX <25p. No signif non-cardiac findings.   Occlusion, cerebral artery    NOS  w/infarction   Osteoporosis    Personal history of radiation therapy 2006   BREAST CA   Pulmonary embolism ([place])    a.04/2021 CTA Chest: Small filling defects are noted in upper and lower lobe branches of L PA-->acute PE-->eliquis. *PE occurred in context of COVID infxn.   Stroke [facility])    Ulcer    Assessment: Patient is a 66yo female admitted for chest pain. CT reveals PE with some concern for heart strain. Pharmacy consulted for Heparin dosing. No anticoagulants noted in medication history.  Goal of Therapy:  Heparin level 0.3-0.7 units/ml Monitor platelets by anticoagulation protocol: Yes    Plan: heparin level therapeutic x 1 Continue heparin infusion at 1150 units/hr Check anti-Xa level in 6 hours and daily while on heparin Continue to monitor H&H and platelets   Glean Salvo, PharmD Clinical Pharmacist  07/17/2022 6:21 AM

## 2022-07-17 NOTE — Progress Notes (Signed)
ANTICOAGULATION CONSULT NOTE - Initial Consult  Pharmacy Consult for apixaban Indication: pulmonary embolus  No Known Allergies  Patient Measurements: Weight: 67.1 kg (148 lb)   Vital Signs: Temp: 98.4 F (36.9 C) (10/28 0500) Temp Source: Oral (10/28 0500) BP: 154/55 (10/28 0854) Pulse Rate: 66 (10/28 0854)  Labs: Recent Labs    07/16/22 1620 07/16/22 1622 07/16/22 1820 07/17/22 0400 07/17/22 0955  HGB 12.2  --   --  11.5*  --   HCT 38.2  --   --  36.0  --   PLT 244  --   --  227  --   APTT  --   --   --  70*  --   LABPROT  --  13.2  --   --   --   INR  --  1.0  --   --   --   HEPARINUNFRC  --   --   --  0.57 >1.10*  CREATININE 0.96  --   --  0.72  --   TROPONINIHS 4  --  4  --   --     Estimated Creatinine Clearance: 57.5 mL/min (by C-G formula based on SCr of 0.72 mg/dL).   Medical History: Past Medical History:  Diagnosis Date   Abdominal pain, epigastric    Allergic rhinitis    Arthritis    Asthma    Breast cancer ([place]) 2006   LT LUMPECTOMY   Clotting disorder ([place])    Cognitive deficits as late effect of cerebrovascular disease    COPD (chronic obstructive pulmonary disease) ([place])    COVID-19 virus infection 04/2021   Crohn's disease ([place]) 05/21/2015   FOLLOWED BY GI   Crohn's disease of both small and large intestine with rectal bleeding ([place]) 12/04/2014   Depression    Currently taking zoloft.   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 05/2021 Echo: EF 60-65%, no rwma, nl RV fxn.   History of kidney stones    Hyperlipidemia    Hypertension    Hypothyroidism    Nonobstructive CAD (coronary artery disease)    a. 10/2012 Cath: diffuse minor irregs-->med rx; b. 08/2021 Cor CTA: Ca2+ = 55.6 (77th%'ile), LAD 25p, LCX <25p. No signif non-cardiac findings.   Occlusion,  cerebral artery    NOS w/infarction   Osteoporosis    Personal history of radiation therapy 2006   BREAST CA   Pulmonary embolism ([place])    a.04/2021 CTA Chest: Small filling defects are noted in upper and lower lobe branches of L PA-->acute PE-->eliquis. *PE occurred in context of COVID infxn.   Stroke [facility])    Ulcer     Assessment: Patient is a 66yo female admitted for chest pain. CT reveals PE with some concern for heart strain. Pharmacy consulted for apixaban dosing.  No anticoagulants noted in medication history.  Goal of Therapy:  Monitor platelets by anticoagulation protocol: Yes   Plan:  Apixaban 16m BID x 7 days, followed by 541mBID  JuAlison MurrayPharmD 07/17/2022,10:57 AM

## 2022-07-17 NOTE — Hospital Course (Signed)
66 year old female with past medical history of breast cancer status postlumpectomy, COPD, Crohn's disease, hypothyroidism and previous CVA who presented to the emergency room on 10/27 with chest pain that had started the day prior and patient came into the emergency room for further evaluation.  Work-up revealed small PE within lower lobe branch of right pulmonary artery with mild heart strain.  Patient admitted to the hospitalist service and started on heparin with echocardiogram ordered.  Not noted to be hypoxic.

## 2022-07-17 NOTE — Consult Note (Signed)
VASCULAR SURGERY CONSULTATION   Requested by:  Florina Ou, MD  Reason for consultation: Pulmonary embolism    History of Present Illness   Carolyn Campbell is a 66 y.o. (Jun 23, 1956) female who presents with cc: LEFT chest pain.  Pt has known LEFT sided PE.  Pt presents with LEFT chest pain.  Pt denies any hemoptysis.  Pt notes some mild LEFT pleuritic pain.  Pt denies any fever or chills or tachycardia.    Pt denies any history of thrombophilia or DVT.    Past Medical History:  Diagnosis Date   Abdominal pain, epigastric    Allergic rhinitis    Arthritis    Asthma    Breast cancer ([place]) 2006   LT LUMPECTOMY   Clotting disorder ([place])    Cognitive deficits as late effect of cerebrovascular disease    COPD (chronic obstructive pulmonary disease) ([place])    COVID-19 virus infection 04/2021   Crohn's disease ([place]) 05/21/2015   FOLLOWED BY GI   Crohn's disease of both small and large intestine with rectal bleeding ([place]) 12/04/2014   Depression    Currently taking zoloft.   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 05/2021 Echo: EF 60-65%, no rwma, nl RV fxn.   History of kidney stones    Hyperlipidemia    Hypertension    Hypothyroidism    Nonobstructive CAD (coronary artery disease)    a. 10/2012 Cath: diffuse minor irregs-->med rx; b. 08/2021 Cor CTA: Ca2+ = 55.6 (77th%'ile), LAD 25p, LCX <25p. No signif non-cardiac findings.   Occlusion, cerebral artery    NOS w/infarction   Osteoporosis    Personal history of radiation therapy 2006   BREAST CA   Pulmonary embolism ([place])    a.04/2021 CTA Chest: Small filling defects are noted in upper and lower lobe branches of L PA-->acute PE-->eliquis. *PE occurred in context of COVID infxn.   Stroke [facility])    Ulcer     Past Surgical History:   Procedure Laterality Date   BREAST BIOPSY Left 2006   POS   BREAST LUMPECTOMY Left 09/20/2004   positive   BREAST SURGERY Left    malignant biopsy   CARDIAC CATHETERIZATION     COLONOSCOPY  2015   COLONOSCOPY WITH PROPOFOL N/A 06/08/2017   Procedure: COLONOSCOPY WITH PROPOFOL;  Surgeon: Lin Landsman, MD;  Location: [place];  Service: Gastroenterology;  Laterality: N/A;   COLONOSCOPY WITH PROPOFOL N/A 03/08/2019   Procedure: COLONOSCOPY WITH PROPOFOL;  Surgeon: Lin Landsman, MD;  Location: [facility] ENDOSCOPY;  Service: Gastroenterology;  Laterality: N/A;   COLONOSCOPY WITH PROPOFOL N/A 07/16/2020   Procedure: COLONOSCOPY WITH PROPOFOL;  Surgeon: Lin Landsman, MD;  Location[facility] ENDOSCOPY;  Service: Gastroenterology;  Laterality: N/A;   ESOPHAGOGASTRODUODENOSCOPY  2015   ESOPHAGOGASTRODUODENOSCOPY (EGD) WITH PROPOFOL N/A 06/08/2017   Procedure: ESOPHAGOGASTRODUODENOSCOPY (EGD) WITH PROPOFOL;  Surgeon: Lin Landsman, MD;  Location: [place];  Service: Gastroenterology;  Laterality: N/A;   ESOPHAGOGASTRODUODENOSCOPY (EGD) WITH PROPOFOL N/A 03/08/2019   Procedure: ESOPHAGOGASTRODUODENOSCOPY (EGD) WITH PROPOFOL;  Surgeon: Lin Landsman, MD;  Location: [facility] ENDOSCOPY;  Service: Gastroenterology;  Laterality: N/A;   FINGER SURGERY     Right small finger   FRACTURE SURGERY     GIV[place]  CAPSULE STUDY  2015   TUBAL LIGATION       Social History   Socioeconomic History   Marital status: Married    Spouse name: javis   Number of children: 2   Years of education: Not on file   Highest education level: Some college, no degree  Occupational History   Not on file  Tobacco Use   Smoking status: Never   Smokeless tobacco: Never  Vaping Use   Vaping Use: Never used  Substance and Sexual Activity   Alcohol use: No    Alcohol/week: 0.0 standard drinks of alcohol   Drug use: No   Sexual activity: Yes    Partners: Male    Birth  control/protection: None  Other Topics Concern   Not on file  Social History Narrative   Married. One son and one daughter. She is disabled after she had breast cancer and strokes. 2 caffeinated beverages daily.   Social Determinants of Health   Financial Resource Strain: Low Risk  (11/06/2019)   Overall Financial Resource Strain (CARDIA)    Difficulty of Paying Living Expenses: Not hard at all  Food Insecurity: No Food Insecurity (11/06/2019)   Hunger Vital Sign    Worried About Running Out of Food in the Last Year: Never true    Ran Out of Food in the Last Year: Never true  Transportation Needs: No Transportation Needs (11/06/2019)   PRAPARE - Hydrologist (Medical): No    Lack of Transportation (Non-Medical): No  Physical Activity: Insufficiently Active (11/06/2019)   Exercise Vital Sign    Days of Exercise per Week: 2 days    Minutes of Exercise per Session: 30 min  Stress: No Stress Concern Present (11/06/2019)   [place]    Feeling of Stress : Not at all  Social Connections: Moderately Integrated (11/06/2019)   Social Connection and Isolation Panel [NHANES]    Frequency of Communication with Friends and Family: More than three times a week    Frequency of Social Gatherings with Friends and Family: Three times a week    Attends Religious Services: More than 4 times per year    Active Member of Clubs or Organizations: No    Attends Archivist Meetings: Never    Marital Status: Married  Human resources officer Violence: Not At Risk (11/06/2019)   Humiliation, Afraid, Rape, and Kick questionnaire    Fear of Current or Ex-Partner: No    Emotionally Abused: No    Physically Abused: No    Sexually Abused: No    Family History  Problem Relation Age of Onset   Heart disease Mother    Heart attack Mother    Cerebrovascular Accident Father    Arthritis Father    Hypertension Father     Breast cancer Sister    Breast cancer Sister 79   Cancer Sister    Hypertension Other    Diabetes Other    Heart attack Other 53    Current Facility-Administered Medications  Medication Dose Route Frequency Provider Last Rate Last Admin   0.9 %  sodium chloride infusion   Intravenous Continuous Para Skeans, MD       acetaminophen (TYLENOL) tablet 650 mg  650 mg Oral Q6H PRN Para Skeans, MD       Or   acetaminophen (TYLENOL) suppository 650 mg  650 mg Rectal Q6H PRN Para Skeans, MD  apixaban (ELIQUIS) tablet 10 mg  10 mg Oral BID Alison Murray, RPH       Followed by   Derrill Memo ON 07/24/2022] apixaban (ELIQUIS) tablet 5 mg  5 mg Oral BID Alison Murray, RPH       hydrochlorothiazide (HYDRODIURIL) tablet 12.5 mg  12.5 mg Oral Daily Wynelle Cleveland, RPH   12.5 mg at 07/17/22 1050   HYDROcodone-acetaminophen (NORCO/VICODIN) 5-325 MG per tablet 1 tablet  1 tablet Oral Q4H PRN Para Skeans, MD       latanoprost (XALATAN) 0.005 % ophthalmic solution 1 drop  1 drop Both Eyes QHS Para Skeans, MD       levothyroxine (SYNTHROID) tablet 50 mcg  50 mcg Oral QAC breakfast Para Skeans, MD       losartan (COZAAR) tablet 50 mg  50 mg Oral Daily Mickeal Skinner A, RPH   50 mg at 07/17/22 1050   morphine (PF) 2 MG/ML injection 2 mg  2 mg Intravenous Q4H PRN Para Skeans, MD       rosuvastatin (CRESTOR) tablet 40 mg  40 mg Oral Daily Para Skeans, MD       sertraline (ZOLOFT) tablet 50 mg  50 mg Oral Daily Florina Ou V, MD   50 mg at 07/17/22 1050   sodium chloride flush (NS) 0.9 % injection 3 mL  3 mL Intravenous Q12H Para Skeans, MD        No Known Allergies   REVIEW OF SYSTEMS (negative unless checked):   Cardiac:  [x]  Chest pain or chest pressure? []  Shortness of breath upon activity? [x]  Shortness of breath when lying flat? []  Irregular heart rhythm?  Vascular:  []  Pain in calf, thigh, or hip brought on by walking? []  Pain in feet at night that wakes you up from  your sleep? []  Blood clot in your veins? []  Leg swelling?  Pulmonary:  []  Oxygen at home? []  Productive cough? []  Wheezing?  Neurologic:  []  Sudden weakness in arms or legs? []  Sudden numbness in arms or legs? []  Sudden onset of difficult speaking or slurred speech? []  Temporary loss of vision in one eye? []  Problems with dizziness?  Gastrointestinal:  []  Blood in stool? []  Vomited blood?  Genitourinary:  []  Burning when urinating? []  Blood in urine?  Psychiatric:  []  Major depression  Hematologic:  []  Bleeding problems? []  Problems with blood clotting?  Dermatologic:  []  Rashes or ulcers?  Constitutional:  []  Fever or chills?  Ear/Nose/Throat:  []  Change in hearing? []  Nose bleeds? []  Sore throat?  Musculoskeletal:  []  Back pain? []  Joint pain? []  Muscle pain?   Physical Examination     Vitals:   07/17/22 0700 07/17/22 0730 07/17/22 0854 07/17/22 1203  BP: 136/70 (!) 119/59 (!) 154/55 (!) 141/57  Pulse: 64 (!) 55 66 70  Resp: 17 16 18 18   Temp:    97.8 F (36.6 C)  TempSrc:      SpO2: 99% 100% 100% 99%  Weight:       Body mass index is 28.9 kg/m.  General Alert, O x 3, WD, NAD  Pulmonary Sym exp, good B air movt, CTA B, oxygen on 2 L/hr via [place]  Cardiac RRR, Nl S1, S2, no Murmurs, No rubs, No S3,S4  Musculo- skeletal BLE: no TTP, no edema    Laboratory   CBC    Latest Ref Rng & Units 07/17/2022    4:00 AM 07/16/2022  4:20 PM 04/23/2022   12:10 PM  CBC  WBC 4.0 - 10.5 K/uL 8.0  9.9  5.6   Hemoglobin 12.0 - 15.0 g/dL 11.5  12.2  12.2   Hematocrit 36.0 - 46.0 % 36.0  38.2  37.8   Platelets 150 - 400 K/uL 227  244  233     BMP    Latest Ref Rng & Units 07/17/2022    4:00 AM 07/16/2022    4:20 PM 08/12/2021   11:07 AM  BMP  Glucose 70 - 99 mg/dL 110  103  108   BUN 8 - 23 mg/dL 12  15  10    Creatinine 0.44 - 1.00 mg/dL 0.72  0.96  0.93   BUN/Creat Ratio 12 - 28   11   Sodium 135 - 145 mmol/L 140  139  144   Potassium 3.5 -  5.1 mmol/L 3.6  3.0  3.9   Chloride 98 - 111 mmol/L 105  103  102   CO2 22 - 32 mmol/L 30  28  26    Calcium 8.9 - 10.3 mg/dL 9.3  9.8  10.2     Coagulation Lab Results  Component Value Date   INR 1.0 07/16/2022    Lipids    Component Value Date/Time   CHOL 193 01/29/2021 1536   CHOL 221 (H) 02/19/2016 0934   TRIG 91 01/29/2021 1536   HDL 89 01/29/2021 1536   HDL 102 02/19/2016 0934   CHOLHDL 2.2 01/29/2021 1536   LDLCALC 85 01/29/2021 1536     Radiology     CT Angio Chest PE W and/or Wo Contrast  Result Date: 07/16/2022 CLINICAL DATA:  Chest pain. EXAM: CT ANGIOGRAPHY CHEST WITH CONTRAST TECHNIQUE: Multidetector CT imaging of the chest was performed using the standard protocol during bolus administration of intravenous contrast. Multiplanar CT image reconstructions and MIPs were obtained to evaluate the vascular anatomy. RADIATION DOSE REDUCTION: This exam was performed according to the departmental dose-optimization program which includes automated exposure control, adjustment of the mA and/or kV according to patient size and/or use of iterative reconstruction technique. CONTRAST:  43m OMNIPAQUE IOHEXOL 350 MG/ML SOLN COMPARISON:  August 20, 2021 FINDINGS: Cardiovascular: There is moderate to marked severity calcification of the thoracic aorta, without evidence of aortic aneurysm. A small amount of intraluminal low attenuation is seen within a lower lobe branch of the right pulmonary artery (axial CT image 41, CT series 4). Normal heart size. Mild right heart strain is seen (RV/LV ratio of 1.04). No pericardial effusion. Mediastinum/Nodes: No enlarged mediastinal, hilar, or axillary lymph nodes. Thyroid gland, trachea, and esophagus demonstrate no significant findings. Lungs/Pleura: Mild atelectasis is seen within the posterior aspect of the bilateral lower lobes. There is no evidence of acute infiltrate, pleural effusion or pneumothorax. Upper Abdomen: No acute abnormality.  Musculoskeletal: No chest wall abnormality. No acute or significant osseous findings. Review of the MIP images confirms the above findings. IMPRESSION: 1. Small amount of pulmonary embolism within a lower lobe branch of the right pulmonary artery. 2. Mild right heart strain (RV/LV ratio of 1.04). 3. Mild bilateral lower lobe atelectasis. 4. Aortic atherosclerosis. Aortic Atherosclerosis (ICD10-I70.0). Electronically Signed   By: TVirgina NorfolkM.D.   On: 07/16/2022 21:16   DG Chest 2 View  Result Date: 07/16/2022 CLINICAL DATA:  Chest pain EXAM: CHEST - 2 VIEW COMPARISON:  Chest x-ray 07/01/2021 FINDINGS: The heart size and mediastinal contours are within normal limits. Both lungs are clear. The visualized skeletal structures are  unremarkable. IMPRESSION: No active cardiopulmonary disease. Electronically Signed   By: Ronney Asters M.D.   On: 07/16/2022 16:42    I reviewed the CTA: There is a small embolism in lower branch of R PA    Medical Decision Making   Carolyn Campbell is a 66 y.o. female who presents with:   Pleuritic LEFT chest pain Pt's sx laterality is not consistent with PE on CTA Unclear etiology of patient's sx Pulmonary embolism I don't see a large thrombus burden in R PA I doubt any benefits to pulmonary thrombectomy Continue anti-coagulation I also don't perform these procedures If sx or oxygenation, reconsult Dr. Lucky Cowboy on Monday as he performs these procedures Thank you for the consult.   Adele Barthel, MD, FACS, FSVS Covering for [facility]: [phone]  07/17/2022, 12:19 PM

## 2022-07-18 NOTE — Assessment & Plan Note (Signed)
Continued on statin

## 2022-07-18 NOTE — Assessment & Plan Note (Signed)
Appropriate, patient was quite coherent and lucid and in complete understanding of medical issues.  She recalled her previous history of her PE without issue.  Resume Exelon patch

## 2022-07-18 NOTE — Assessment & Plan Note (Signed)
Patient met criteria with BMI greater than 25

## 2022-07-18 NOTE — Discharge Summary (Signed)
Physician Discharge Summary   Patient: Carolyn Campbell MRN: [id number] DOB: July 06, 1956  Admit date:     07/16/2022  Discharge date: 07/17/2022  Discharge Physician: Annita Brod   PCP: Delsa Grana, PA-C   Recommendations at discharge:   New medication: Eliquis 10 mg p.o. twice daily x1 week then 5 mg p.o. twice daily for the next 6 months Patient will follow-up with her PCP, Delsa Grana, sometime in the next 2 months before the end of 2023 to ensure that she is tolerating Eliquis well.  Discharge Diagnoses: Principal Problem:   Pulmonary embolism (HCC) Active Problems:   Crohn's disease of both small and large intestine with rectal bleeding (HCC)   Cognitive deficits as late effect of cerebrovascular disease   Benign essential HTN   Low back pain radiating to left leg   Major depression in remission ([place])   Nonobstructive CAD (coronary artery disease)   Glaucoma associated with chamber angle anomalies   Hypercholesterolemia   Hypothyroidism   Overweight (BMI 25.0-29.9)  Resolved Problems:   * No resolved hospital problems. *  Hospital Course: 66 year old female with past medical history of breast cancer status postlumpectomy, COPD, Crohn's disease, hypothyroidism and previous CVA who presented to the emergency room on 10/27 with chest pain that had started the day prior and patient came into the emergency room for further evaluation.  Work-up revealed small PE within lower lobe branch of right pulmonary artery with mild heart strain.  Patient admitted to the hospitalist service and started on heparin with echocardiogram ordered.  Not noted to be hypoxic.  Assessment and Plan: * Pulmonary embolism ([place]) Attribute to her new acute PE,  Per cta shows: 1. Small amount of pulmonary embolism within a lower lobe branch of the right pulmonary artery. 2. Mild right heart strain (RV/LV ratio of 1.04). 3. Mild bilateral lower lobe atelectasis. 4. Aortic atherosclerosis. ? If pt  has has underlying hypercoagulable etiology.  Continue heparin gtt.  2 D Echo. Oxygen 2 L [place].  EKG shows incomplete RBBB. TNI negative.    Benign essential HTN Vitals:   07/16/22 1618 07/16/22 1930 07/16/22 2200  BP: (!) 134/56 139/65 (!) 177/71  Continue Hyzaar.   Low back pain radiating to left leg We will get doppler or LE.    Nonobstructive CAD (coronary artery disease) Cont statin and hyzaar.    Glaucoma associated with chamber angle anomalies Latanoprost 1 drop both eyes.    Hypothyroidism Continue levothyroxine 50 MCG.          Consultants: None Procedures performed: None Disposition: Home Diet recommendation:  Discharge Diet Orders (From admission, onward)     Start     Ordered   07/17/22 0000  Diet - low sodium heart healthy        07/17/22 1302           Cardiac diet DISCHARGE MEDICATION: Allergies as of 07/17/2022   No Known Allergies      Medication List     STOP taking these medications    clonazePAM 0.5 MG tablet Commonly known as: KLONOPIN       TAKE these medications    Eliquis DVT/PE Starter Pack Generic drug: Apixaban Starter Pack (2m and 528m Take as directed on package: start with two-75m24mablets twice daily for 7 days. On day 8, switch to one-75mg30mblet twice daily.   apixaban 5 MG Tabs tablet Commonly known as: Eliquis Take 1 tablet (5 mg total) by mouth 2 (two) times daily. Start  taking on: August 14, 2022   latanoprost 0.005 % ophthalmic solution Commonly known as: XALATAN Place 1 drop into both eyes at bedtime.   levothyroxine 50 MCG tablet Commonly known as: SYNTHROID Take 1 tablet (50 mcg total) by mouth daily before breakfast. On empty stomach   losartan-hydrochlorothiazide 50-12.5 MG tablet Commonly known as: HYZAAR Take 1 tablet by mouth daily.   multivitamin tablet Take 1 tablet by mouth daily.   rivastigmine 1.5 MG capsule Commonly known as: EXELON Take 1.5 mg by mouth See admin  instructions. Take 1 capsule (1.5 mg total) by mouth once daily for 14 days, THEN 1 capsule (1.5 mg total) 2 (two) times daily with meals for 16 days.   rosuvastatin 40 MG tablet Commonly known as: CRESTOR Take 1 tablet (40 mg total) by mouth daily.   sertraline 50 MG tablet Commonly known as: ZOLOFT Take 1 tablet (50 mg total) by mouth daily.   triamcinolone ointment 0.1 % Commonly known as: KENALOG Apply to affected skin one to two times daily for up to two weeks What changed:  how much to take when to take this reasons to take this   valACYclovir 1000 MG tablet Commonly known as: VALTREX Take 1 tablet (1,000 mg total) by mouth 2 (two) times daily. What changed:  when to take this reasons to take this        Discharge Exam: Filed Weights   07/16/22 1619  Weight: 67.1 kg   General: Alert and oriented x3, no acute distress Cardiovascular: Regular rate and rhythm, S1 2 lungs: Clear to auscultation bilaterally, no pain with inspiratory effort  Condition at discharge: good  The results of significant diagnostics from this hospitalization (including imaging, microbiology, ancillary and laboratory) are listed below for reference.   Imaging Studies: CT Angio Chest PE W and/or Wo Contrast  Result Date: 07/16/2022 CLINICAL DATA:  Chest pain. EXAM: CT ANGIOGRAPHY CHEST WITH CONTRAST TECHNIQUE: Multidetector CT imaging of the chest was performed using the standard protocol during bolus administration of intravenous contrast. Multiplanar CT image reconstructions and MIPs were obtained to evaluate the vascular anatomy. RADIATION DOSE REDUCTION: This exam was performed according to the departmental dose-optimization program which includes automated exposure control, adjustment of the mA and/or kV according to patient size and/or use of iterative reconstruction technique. CONTRAST:  70m OMNIPAQUE IOHEXOL 350 MG/ML SOLN COMPARISON:  August 20, 2021 FINDINGS: Cardiovascular: There is  moderate to marked severity calcification of the thoracic aorta, without evidence of aortic aneurysm. A small amount of intraluminal low attenuation is seen within a lower lobe branch of the right pulmonary artery (axial CT image 41, CT series 4). Normal heart size. Mild right heart strain is seen (RV/LV ratio of 1.04). No pericardial effusion. Mediastinum/Nodes: No enlarged mediastinal, hilar, or axillary lymph nodes. Thyroid gland, trachea, and esophagus demonstrate no significant findings. Lungs/Pleura: Mild atelectasis is seen within the posterior aspect of the bilateral lower lobes. There is no evidence of acute infiltrate, pleural effusion or pneumothorax. Upper Abdomen: No acute abnormality. Musculoskeletal: No chest wall abnormality. No acute or significant osseous findings. Review of the MIP images confirms the above findings. IMPRESSION: 1. Small amount of pulmonary embolism within a lower lobe branch of the right pulmonary artery. 2. Mild right heart strain (RV/LV ratio of 1.04). 3. Mild bilateral lower lobe atelectasis. 4. Aortic atherosclerosis. Aortic Atherosclerosis (ICD10-I70.0). Electronically Signed   By: TVirgina NorfolkM.D.   On: 07/16/2022 21:16   DG Chest 2 View  Result Date: 07/16/2022 CLINICAL  DATA:  Chest pain EXAM: CHEST - 2 VIEW COMPARISON:  Chest x-ray 07/01/2021 FINDINGS: The heart size and mediastinal contours are within normal limits. Both lungs are clear. The visualized skeletal structures are unremarkable. IMPRESSION: No active cardiopulmonary disease. Electronically Signed   By: Ronney Asters M.D.   On: 07/16/2022 16:42   MM 3D SCREEN BREAST BILATERAL  Result Date: 06/25/2022 CLINICAL DATA:  Screening. EXAM: DIGITAL SCREENING BILATERAL MAMMOGRAM WITH TOMOSYNTHESIS AND CAD TECHNIQUE: Bilateral screening digital craniocaudal and mediolateral oblique mammograms were obtained. Bilateral screening digital breast tomosynthesis was performed. The images were evaluated with  computer-aided detection. COMPARISON:  Previous exam(s). ACR Breast Density Category c: The breast tissue is heterogeneously dense, which may obscure small masses. FINDINGS: There are no findings suspicious for malignancy. IMPRESSION: No mammographic evidence of malignancy. A result letter of this screening mammogram will be mailed directly to the patient. RECOMMENDATION: Screening mammogram in one year. (Code:[id number]-B-01Y) BI-RADS CATEGORY  1: Negative. Electronically Signed   By: Zerita Boers M.D.   On: 06/25/2022 13:57    Microbiology: Results for orders placed or performed during the hospital encounter of 07/14/20  SARS CORONAVIRUS 2 (TAT 6-24 HRS) Nasopharyngeal Nasopharyngeal Swab     Status: None   Collection Time: 07/14/20 10:03 AM   Specimen: Nasopharyngeal Swab  Result Value Ref Range Status   SARS Coronavirus 2 NEGATIVE NEGATIVE Final    Comment: (NOTE) SARS-CoV-2 target nucleic acids are NOT DETECTED.  The SARS-CoV-2 RNA is generally detectable in upper and lower respiratory specimens during the acute phase of infection. Negative results do not preclude SARS-CoV-2 infection, do not rule out co-infections with other pathogens, and should not be used as the sole basis for treatment or other patient management decisions. Negative results must be combined with clinical observations, patient history, and epidemiological information. The expected result is Negative.  Fact Sheet for Patients: SugarRoll.be  Fact Sheet for Healthcare Providers: https://www.woods-mathews.com/  This test is not yet approved or cleared by the [place] FDA and  has been authorized for detection and/or diagnosis of SARS-CoV-2 by FDA under an Emergency Use Authorization (EUA). This EUA will remain  in effect (meaning this test can be used) for the duration of the COVID-19 declaration under Se ction 564(b)(1) of the Act, 21 U.S.C. section 360bbb-3(b)(1), unless  the authorization is terminated or revoked sooner.  Performed at [facility], [place] [address], [place], [place] [address]     Labs: CBC: Recent Labs  Lab 07/16/22 1620 07/17/22 0400  WBC 9.9 8.0  NEUTROABS 6.1  --   HGB 12.2 11.5*  HCT 38.2 36.0  MCV 90.1 90.9  PLT 244 347   Basic Metabolic Panel: Recent Labs  Lab 07/16/22 1620 07/17/22 0400  NA 139 140  K 3.0* 3.6  CL 103 105  CO2 28 30  GLUCOSE 103* 110*  BUN 15 12  CREATININE 0.96 0.72  CALCIUM 9.8 9.3   Liver Function Tests: Recent Labs  Lab 07/17/22 0400  AST 18  ALT 14  ALKPHOS 65  BILITOT 0.8  PROT 7.2  ALBUMIN 3.8   CBG: No results for input(s): "GLUCAP" in the last 168 hours.  Discharge time spent: less than 30 minutes.  Signed: Annita Brod, MD [facility] 07/18/2022

## 2022-07-18 NOTE — Assessment & Plan Note (Signed)
Stable during this hospitalization

## 2022-07-18 NOTE — Assessment & Plan Note (Signed)
Continued on home medications

## 2022-07-19 ENCOUNTER — Emergency Department: Payer: Medicare Other

## 2022-07-19 ENCOUNTER — Inpatient Hospital Stay
Admission: EM | Admit: 2022-07-19 | Discharge: 2022-07-22 | DRG: 072 | Disposition: A | Discharge status: Other Acute Inpatient Hospital | Payer: Medicare Other | Attending: Internal Medicine | Admitting: Internal Medicine

## 2022-07-19 ENCOUNTER — Other Ambulatory Visit: Payer: Self-pay

## 2022-07-19 ENCOUNTER — Telehealth: Payer: Self-pay | Admitting: Cardiovascular Disease

## 2022-07-19 ENCOUNTER — Encounter (INDEPENDENT_AMBULATORY_CARE_PROVIDER_SITE_OTHER): Payer: Self-pay

## 2022-07-19 DIAGNOSIS — K219 Gastro-esophageal reflux disease without esophagitis: Secondary | ICD-10-CM | POA: Diagnosis present

## 2022-07-19 DIAGNOSIS — Z8616 Personal history of COVID-19: Secondary | ICD-10-CM

## 2022-07-19 DIAGNOSIS — H409 Unspecified glaucoma: Secondary | ICD-10-CM | POA: Diagnosis present

## 2022-07-19 DIAGNOSIS — K50811 Crohn's disease of both small and large intestine with rectal bleeding: Secondary | ICD-10-CM | POA: Diagnosis present

## 2022-07-19 DIAGNOSIS — R4189 Other symptoms and signs involving cognitive functions and awareness: Secondary | ICD-10-CM | POA: Diagnosis present

## 2022-07-19 DIAGNOSIS — I69919 Unspecified symptoms and signs involving cognitive functions following unspecified cerebrovascular disease: Secondary | ICD-10-CM | POA: Diagnosis present

## 2022-07-19 DIAGNOSIS — G9340 Encephalopathy, unspecified: Secondary | ICD-10-CM

## 2022-07-19 DIAGNOSIS — Q159 Congenital malformation of eye, unspecified: Secondary | ICD-10-CM

## 2022-07-19 DIAGNOSIS — Z8249 Family history of ischemic heart disease and other diseases of the circulatory system: Secondary | ICD-10-CM

## 2022-07-19 DIAGNOSIS — Z803 Family history of malignant neoplasm of breast: Secondary | ICD-10-CM

## 2022-07-19 DIAGNOSIS — F325 Major depressive disorder, single episode, in full remission: Secondary | ICD-10-CM | POA: Diagnosis present

## 2022-07-19 DIAGNOSIS — Z923 Personal history of irradiation: Secondary | ICD-10-CM

## 2022-07-19 DIAGNOSIS — I69922 Dysarthria following unspecified cerebrovascular disease: Secondary | ICD-10-CM

## 2022-07-19 DIAGNOSIS — J9587 Transfusion-associated dyspnea (tad): Secondary | ICD-10-CM | POA: Diagnosis not present

## 2022-07-19 DIAGNOSIS — R002 Palpitations: Secondary | ICD-10-CM | POA: Diagnosis present

## 2022-07-19 DIAGNOSIS — E039 Hypothyroidism, unspecified: Secondary | ICD-10-CM | POA: Diagnosis present

## 2022-07-19 DIAGNOSIS — R7303 Prediabetes: Secondary | ICD-10-CM | POA: Diagnosis present

## 2022-07-19 DIAGNOSIS — I2510 Atherosclerotic heart disease of native coronary artery without angina pectoris: Secondary | ICD-10-CM | POA: Diagnosis present

## 2022-07-19 DIAGNOSIS — R4182 Altered mental status, unspecified: Secondary | ICD-10-CM | POA: Diagnosis present

## 2022-07-19 DIAGNOSIS — E7800 Pure hypercholesterolemia, unspecified: Secondary | ICD-10-CM | POA: Diagnosis present

## 2022-07-19 DIAGNOSIS — G9341 Metabolic encephalopathy: Principal | ICD-10-CM | POA: Diagnosis present

## 2022-07-19 DIAGNOSIS — Z79899 Other long term (current) drug therapy: Secondary | ICD-10-CM

## 2022-07-19 DIAGNOSIS — J449 Chronic obstructive pulmonary disease, unspecified: Secondary | ICD-10-CM | POA: Diagnosis present

## 2022-07-19 DIAGNOSIS — M810 Age-related osteoporosis without current pathological fracture: Secondary | ICD-10-CM | POA: Diagnosis present

## 2022-07-19 DIAGNOSIS — M5432 Sciatica, left side: Secondary | ICD-10-CM | POA: Diagnosis present

## 2022-07-19 DIAGNOSIS — I10 Essential (primary) hypertension: Secondary | ICD-10-CM | POA: Diagnosis not present

## 2022-07-19 DIAGNOSIS — H4050X0 Glaucoma secondary to other eye disorders, unspecified eye, stage unspecified: Secondary | ICD-10-CM

## 2022-07-19 DIAGNOSIS — G4752 REM sleep behavior disorder: Secondary | ICD-10-CM | POA: Diagnosis present

## 2022-07-19 DIAGNOSIS — Z86711 Personal history of pulmonary embolism: Secondary | ICD-10-CM

## 2022-07-19 DIAGNOSIS — Z79890 Hormone replacement therapy (postmenopausal): Secondary | ICD-10-CM

## 2022-07-19 DIAGNOSIS — Z7901 Long term (current) use of anticoagulants: Secondary | ICD-10-CM

## 2022-07-19 DIAGNOSIS — I2699 Other pulmonary embolism without acute cor pulmonale: Secondary | ICD-10-CM | POA: Diagnosis present

## 2022-07-19 DIAGNOSIS — R0600 Dyspnea, unspecified: Secondary | ICD-10-CM | POA: Diagnosis present

## 2022-07-19 DIAGNOSIS — Z823 Family history of stroke: Secondary | ICD-10-CM

## 2022-07-19 DIAGNOSIS — Z853 Personal history of malignant neoplasm of breast: Secondary | ICD-10-CM

## 2022-07-19 LAB — DIFFERENTIAL
Abs Immature Granulocytes: 0.02 10*3/uL (ref 0.00–0.07)
Basophils Absolute: 0 10*3/uL (ref 0.0–0.1)
Basophils Relative: 1 %
Eosinophils Absolute: 0 10*3/uL (ref 0.0–0.5)
Eosinophils Relative: 1 %
Immature Granulocytes: 0 %
Lymphocytes Relative: 29 %
Lymphs Abs: 1.7 10*3/uL (ref 0.7–4.0)
Monocytes Absolute: 0.5 10*3/uL (ref 0.1–1.0)
Monocytes Relative: 8 %
Neutro Abs: 3.6 10*3/uL (ref 1.7–7.7)
Neutrophils Relative %: 61 %

## 2022-07-19 LAB — APTT: aPTT: 36 seconds (ref 24–36)

## 2022-07-19 LAB — BRAIN NATRIURETIC PEPTIDE: B Natriuretic Peptide: 10 pg/mL (ref 0.0–100.0)

## 2022-07-19 LAB — URINE DRUG SCREEN, QUALITATIVE (ARMC ONLY)
Amphetamines, Ur Screen: NOT DETECTED
Barbiturates, Ur Screen: NOT DETECTED
Benzodiazepine, Ur Scrn: NOT DETECTED
Cannabinoid 50 Ng, Ur ~~LOC~~: NOT DETECTED
Cocaine Metabolite,Ur ~~LOC~~: NOT DETECTED
MDMA (Ecstasy)Ur Screen: NOT DETECTED
Methadone Scn, Ur: NOT DETECTED
Opiate, Ur Screen: NOT DETECTED
Phencyclidine (PCP) Ur S: NOT DETECTED
Tricyclic, Ur Screen: NOT DETECTED

## 2022-07-19 LAB — COMPREHENSIVE METABOLIC PANEL
ALT: 12 U/L (ref 0–44)
AST: 24 U/L (ref 15–41)
Albumin: 4.4 g/dL (ref 3.5–5.0)
Alkaline Phosphatase: 71 U/L (ref 38–126)
Anion gap: 12 (ref 5–15)
BUN: 12 mg/dL (ref 8–23)
CO2: 28 mmol/L (ref 22–32)
Calcium: 10.6 mg/dL — ABNORMAL HIGH (ref 8.9–10.3)
Chloride: 100 mmol/L (ref 98–111)
Creatinine, Ser: 0.94 mg/dL (ref 0.44–1.00)
GFR, Estimated: >60 mL/min (ref >60)
Glucose, Bld: 125 mg/dL — ABNORMAL HIGH (ref 70–99)
Potassium: 3.7 mmol/L (ref 3.5–5.1)
Sodium: 140 mmol/L (ref 135–145)
Total Bilirubin: 0.9 mg/dL (ref 0.3–1.2)
Total Protein: 8.5 g/dL — ABNORMAL HIGH (ref 6.5–8.1)

## 2022-07-19 LAB — URINALYSIS, ROUTINE W REFLEX MICROSCOPIC
Bilirubin Urine: NEGATIVE
Glucose, UA: NEGATIVE mg/dL
Hgb urine dipstick: NEGATIVE
Ketones, ur: NEGATIVE mg/dL
Leukocytes,Ua: NEGATIVE
Nitrite: NEGATIVE
Protein, ur: NEGATIVE mg/dL
Specific Gravity, Urine: >1.046 — ABNORMAL HIGH (ref 1.005–1.030)
pH: 8 (ref 5.0–8.0)

## 2022-07-19 LAB — CBC
HCT: 38.6 % (ref 36.0–46.0)
Hemoglobin: 12.8 g/dL (ref 12.0–15.0)
MCH: 29 pg (ref 26.0–34.0)
MCHC: 33.2 g/dL (ref 30.0–36.0)
MCV: 87.3 fL (ref 80.0–100.0)
Platelets: 285 10*3/uL (ref 150–400)
RBC: 4.42 MIL/uL (ref 3.87–5.11)
RDW: 12.9 % (ref 11.5–15.5)
WBC: 5.8 10*3/uL (ref 4.0–10.5)
nRBC: 0 % (ref 0.0–0.2)

## 2022-07-19 LAB — TROPONIN I (HIGH SENSITIVITY): Troponin I (High Sensitivity): 5 ng/L (ref <18)

## 2022-07-19 LAB — PROTIME-INR
INR: 1.8 — ABNORMAL HIGH (ref 0.8–1.2)
Prothrombin Time: 20.6 seconds — ABNORMAL HIGH (ref 11.4–15.2)

## 2022-07-19 LAB — ETHANOL: Alcohol, Ethyl (B): <10 mg/dL (ref <10)

## 2022-07-19 LAB — CBG MONITORING, ED: Glucose-Capillary: 101 mg/dL — ABNORMAL HIGH (ref 70–99)

## 2022-07-19 MED ORDER — LEVOTHYROXINE SODIUM 50 MCG PO TABS
50.0000 ug | ORAL_TABLET | Freq: Every day | ORAL | Status: DC
  Administered 2022-07-21 – 2022-07-22 (×2): 50 ug via ORAL
  Filled 2022-07-19 (×2): qty 1

## 2022-07-19 MED ORDER — SODIUM CHLORIDE 0.9% FLUSH
3.0000 mL | Freq: Two times a day (BID) | INTRAVENOUS | Status: DC
  Administered 2022-07-21 – 2022-07-22 (×3): 3 mL via INTRAVENOUS

## 2022-07-19 MED ORDER — ACETAMINOPHEN 325 MG PO TABS
650.0000 mg | ORAL_TABLET | Freq: Four times a day (QID) | ORAL | Status: DC | PRN
  Administered 2022-07-20 – 2022-07-22 (×2): 650 mg via ORAL
  Filled 2022-07-19 (×2): qty 2

## 2022-07-19 MED ORDER — IOHEXOL 350 MG/ML SOLN
100.0000 mL | Freq: Once | INTRAVENOUS | Status: AC | PRN
  Administered 2022-07-19: 100 mL via INTRAVENOUS

## 2022-07-19 MED ORDER — ACETAMINOPHEN 650 MG RE SUPP: 650.0000 mg | Freq: Four times a day (QID) | RECTAL | Status: DC | PRN

## 2022-07-19 MED ORDER — MOXIFLOXACIN HCL 0.5 % OP SOLN
1.0000 [drp] | Freq: Three times a day (TID) | OPHTHALMIC | Status: DC
  Administered 2022-07-20 – 2022-07-22 (×8): 1 [drp] via OPHTHALMIC
  Filled 2022-07-19 (×2): qty 3
  Filled 2022-07-19: qty 0.1

## 2022-07-19 MED ORDER — LORAZEPAM 2 MG/ML IJ SOLN
1.0000 mg | Freq: Once | INTRAMUSCULAR | Status: AC
  Administered 2022-07-19: 1 mg via INTRAVENOUS
  Filled 2022-07-19: qty 1

## 2022-07-19 MED ORDER — HYDROCODONE-ACETAMINOPHEN 5-325 MG PO TABS
1.0000 | ORAL_TABLET | ORAL | Status: DC | PRN
  Administered 2022-07-21 (×2): 1 via ORAL
  Filled 2022-07-19 (×2): qty 1

## 2022-07-19 MED ORDER — LATANOPROST 0.005 % OP SOLN
1.0000 [drp] | Freq: Every day | OPHTHALMIC | Status: DC
  Administered 2022-07-20: 1 [drp] via OPHTHALMIC
  Filled 2022-07-19: qty 2.5

## 2022-07-19 MED ORDER — MORPHINE SULFATE (PF) 2 MG/ML IV SOLN: 2.0000 mg | INTRAVENOUS | Status: DC | PRN

## 2022-07-19 MED ORDER — LACTATED RINGERS IV SOLN: INTRAVENOUS | Status: DC

## 2022-07-19 MED ORDER — APIXABAN 5 MG PO TABS: 5.0000 mg | ORAL_TABLET | Freq: Two times a day (BID) | ORAL | Status: DC

## 2022-07-19 NOTE — H&P (Signed)
History and Physical    Chief Complaint: Dyspnea.    HISTORY OF PRESENT ILLNESS: Carolyn Campbell is an 66 y.o. female  seen in ed for dyspnea.  Pt was at home and per husband he noticed she was breathing fast he got worried and called EMS. Upon EMS eval pt was weak and not able to walk on her own which ic her baseline and when she got up to walk she felt exteremly dizzy. When she was on stretcher she was unresponsive. She did this when in ed and almost catatonic. Not sure if she is not cooperating and doe snot want to answer my question or is having psychiatric behavior disturbance.  did talk to family about same.   Pt has PMH as below: Past Medical History:  Diagnosis Date   Abdominal pain, epigastric    Allergic rhinitis    Arthritis    Asthma    Breast cancer ([place]) 2006   LT LUMPECTOMY   Clotting disorder ([place])    Cognitive deficits as late effect of cerebrovascular disease    COPD (chronic obstructive pulmonary disease) ([place])    COVID-19 virus infection 04/2021   Crohn's disease ([place]) 05/21/2015   FOLLOWED BY GI   Crohn's disease of both small and large intestine with rectal bleeding ([place]) 12/04/2014   Depression    Currently taking zoloft.   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 05/2021 Echo: EF 60-65%, no rwma, nl RV fxn.   History of kidney stones    Hypercholesterolemia 01/18/2007   Hyperlipidemia    Hypertension    Hypothyroidism    Low back pain radiating to left leg 06/17/2021   Nonobstructive CAD (coronary artery disease)    a. 10/2012 Cath: diffuse minor irregs-->med rx; b. 08/2021 Cor CTA: Ca2+ = 55.6 (77th%'ile), LAD 25p, LCX <25p. No signif non-cardiac findings.   Occlusion, cerebral artery    NOS w/infarction   Osteoporosis    Overweight (BMI 25.0-29.9) 07/17/2022    Personal history of radiation therapy 2006   BREAST CA   Pulmonary embolism ([place])    a.04/2021 CTA Chest: Small filling defects are noted in upper and lower lobe branches of L PA-->acute PE-->eliquis. *PE occurred in context of COVID infxn.   Stroke [facility]    Ulcer      Review of Systems  Unable to perform ROS: Mental status change      No Known Allergies   Past Surgical History:  Procedure Laterality Date   BREAST BIOPSY Left 2006   POS   BREAST LUMPECTOMY Left 09/20/2004   positive   BREAST SURGERY Left    malignant biopsy   CARDIAC CATHETERIZATION     COLONOSCOPY  2015   COLONOSCOPY WITH PROPOFOL N/A 06/08/2017   Procedure: COLONOSCOPY WITH PROPOFOL;  Surgeon: Lin Landsman, MD;  Location: [facility];  Service: Gastroenterology;  Laterality: N/A;   COLONOSCOPY WITH PROPOFOL N/A 03/08/2019   Procedure: COLONOSCOPY WITH PROPOFOL;  Surgeon: Lin Landsman, MD;  Location: [facility] ENDOSCOPY;  Service: Gastroenterology;  Laterality: N/A;   COLONOSCOPY WITH PROPOFOL N/A 07/16/2020   Procedure: COLONOSCOPY WITH PROPOFOL;  Surgeon: Lin Landsman, MD;  Location: [facility] ENDOSCOPY;  Service: Gastroenterology;  Laterality: N/A;   ESOPHAGOGASTRODUODENOSCOPY  2015   ESOPHAGOGASTRODUODENOSCOPY (EGD) WITH PROPOFOL N/A  06/08/2017   Procedure: ESOPHAGOGASTRODUODENOSCOPY (EGD) WITH PROPOFOL;  Surgeon: Lin Landsman, MD;  Location: [facility];  Service: Gastroenterology;  Laterality: N/A;   ESOPHAGOGASTRODUODENOSCOPY (EGD) WITH PROPOFOL N/A 03/08/2019   Procedure: ESOPHAGOGASTRODUODENOSCOPY (EGD) WITH PROPOFOL;  Surgeon: Lin Landsman, MD;  Location[facility] ENDOSCOPY;  Service: Gastroenterology;  Laterality: N/A;   FINGER SURGERY     Right small finger   FRACTURE SURGERY     GIVENS CAPSULE STUDY  2015   TUBAL LIGATION        Social History   Socioeconomic History   Marital status: Married    Spouse name: javis   Number of children: 2   Years of  education: Not on file   Highest education level: Some college, no degree  Occupational History   Not on file  Tobacco Use   Smoking status: Never   Smokeless tobacco: Never  Vaping Use   Vaping Use: Never used  Substance and Sexual Activity   Alcohol use: No    Alcohol/week: 0.0 standard drinks of alcohol   Drug use: No   Sexual activity: Yes    Partners: Male    Birth control/protection: None  Other Topics Concern   Not on file  Social History Narrative   Married. One son and one daughter. She is disabled after she had breast cancer and strokes. 2 caffeinated beverages daily.   Social Determinants of Health   Financial Resource Strain: Low Risk  (11/06/2019)   Overall Financial Resource Strain (CARDIA)    Difficulty of Paying Living Expenses: Not hard at all  Food Insecurity: No Food Insecurity (11/06/2019)   Hunger Vital Sign    Worried About Running Out of Food in the Last Year: Never true    Ran Out of Food in the Last Year: Never true  Transportation Needs: No Transportation Needs (11/06/2019)   PRAPARE - Hydrologist (Medical): No    Lack of Transportation (Non-Medical): No  Physical Activity: Insufficiently Active (11/06/2019)   Exercise Vital Sign    Days of Exercise per Week: 2 days    Minutes of Exercise per Session: 30 min  Stress: No Stress Concern Present (11/06/2019)   [place]    Feeling of Stress : Not at all  Social Connections: Moderately Integrated (11/06/2019)   Social Connection and Isolation Panel [NHANES]    Frequency of Communication with Friends and Family: More than three times a week    Frequency of Social Gatherings with Friends and Family: Three times a week    Attends Religious Services: More than 4 times per year    Active Member of Clubs or Organizations: No    Attends Archivist Meetings: Never    Marital Status: Married      CURRENT  MEDS:  Current Facility-Administered Medications (Endocrine & Metabolic):    [START ON 87/86/7672] levothyroxine (SYNTHROID) tablet 50 mcg  Current Outpatient Medications (Endocrine & Metabolic):    levothyroxine (SYNTHROID) 50 MCG tablet, Take 1 tablet (50 mcg total) by mouth daily before breakfast. On empty stomach   Current Outpatient Medications (Cardiovascular):    losartan-hydrochlorothiazide (HYZAAR) 50-12.5 MG tablet, Take 1 tablet by mouth daily.   rosuvastatin (CRESTOR) 40 MG tablet, Take 1 tablet (40 mg total) by mouth daily.    Current Facility-Administered Medications (Analgesics):    acetaminophen (TYLENOL) tablet 650 mg **OR** acetaminophen (TYLENOL) suppository 650 mg   HYDROcodone-acetaminophen (NORCO/VICODIN) 5-325 MG per tablet  1 tablet   morphine (PF) 2 MG/ML injection 2 mg   Current Facility-Administered Medications (Hematological):    [START ON 08/14/2022] apixaban (ELIQUIS) tablet 5 mg  Current Outpatient Medications (Hematological):    Apixaban Starter Pack, 69m and 530m (ELIQUIS DVT/PE STARTER PACK), Take as directed on package: start with two-103m79mablets twice daily for 7 days. On day 8, switch to one-103mg61mblet twice daily.   [START ON 08/14/2022] apixaban (ELIQUIS) 5 MG TABS tablet, Take 1 tablet (5 mg total) by mouth 2 (two) times daily.  Current Facility-Administered Medications (Other):    lactated ringers infusion   latanoprost (XALATAN) 0.005 % ophthalmic solution 1 drop   moxifloxacin (VIGAMOX) 0.5 % ophthalmic solution 1 drop   sodium chloride flush (NS) 0.9 % injection 3 mL  Current Outpatient Medications (Other):    latanoprost (XALATAN) 0.005 % ophthalmic solution, Place 1 drop into both eyes at bedtime.    moxifloxacin (VIGAMOX) 0.5 % ophthalmic solution, Apply to eye.   Multiple Vitamin (MULTIVITAMIN) tablet, Take 1 tablet by mouth daily.   neomycin-polymyxin b-dexamethasone (MAXITROL) 3.5-10000-0.1 OINT, SMARTSIG:sparingly In Eye(s)  Twice Daily   sertraline (ZOLOFT) 50 MG tablet, Take 1 tablet (50 mg total) by mouth daily.   rivastigmine (EXELON) 1.5 MG capsule, Take 1.5 mg by mouth See admin instructions. Take 1 capsule (1.5 mg total) by mouth once daily for 14 days, THEN 1 capsule (1.5 mg total) 2 (two) times daily with meals for 16 days.   triamcinolone ointment (KENALOG) 0.1 %, Apply to affected skin one to two times daily for up to two weeks (Patient taking differently: 1 Application 2 (two) times daily as needed. Apply to affected skin one to two times daily for up to two weeks)   valACYclovir (VALTREX) 1000 MG tablet, Take 1 tablet (1,000 mg total) by mouth 2 (two) times daily. (Patient taking differently: Take 1,000 mg by mouth 2 (two) times daily as needed.)    ED Course: Pt in Ed pt is alert and oriented and then in middle of conversation she becomes catatonic.  Vitals:   07/19/22 1700 07/19/22 1730 07/19/22 1930 07/19/22 2000  BP: (!) 140/73 (!) 158/80 (!) 146/65 (!) 159/68  Pulse: 96 90 87 94  Resp: 20 19 19 16   Temp:   98 F (36.7 C)   TempSrc:   Oral   SpO2: 97% 100% 98% 100%  Weight:      Height:       No intake/output data recorded. SpO2: 100 % Blood work in ed shows  Results for orders placed or performed during the hospital encounter of 07/19/22 (from the past 48 hour(s))  Ethanol     Status: None   Collection Time: 07/19/22  2:12 PM  Result Value Ref Range   Alcohol, Ethyl (B) <10 <10 mg/dL    Comment: (NOTE) Lowest detectable limit for serum alcohol is 10 mg/dL.  For medical purposes only. Performed at [facility] [id number]     Status: Abnormal   Collection Time: 07/19/22  2:12 PM  Result Value Ref Range   Prothrombin Time 20.6 (H) 11.4 - 15.2 seconds   INR 1.8 (H) 0.8 - 1.2    Comment: (NOTE) INR goal varies based on device and disease states. Performed at [facility]  [id number]     Status: None   Collection Time: 07/19/22  2:12 PM  Result Value Ref  Range   aPTT 36 24 - 36 seconds    Comment: Performed at [facility], [address] [place], [place] [address]  CBC     Status: None   Collection Time: 07/19/22  2:12 PM  Result Value Ref Range   WBC 5.8 4.0 - 10.5 K/uL   RBC 4.42 3.87 - 5.11 MIL/uL   Hemoglobin 12.8 12.0 - 15.0 g/dL   HCT 38.6 36.0 - 46.0 %   MCV 87.3 80.0 - 100.0 fL   MCH 29.0 26.0 - 34.0 pg   MCHC 33.2 30.0 - 36.0 g/dL   RDW 12.9 11.5 - 15.5 %   Platelets 285 150 - 400 K/uL   nRBC 0.0 0.0 - 0.2 %    Comment: Performed at [facility], [address] [place], [place] [address]  Differential     Status: None   Collection Time: 07/19/22  2:12 PM  Result Value Ref Range   Neutrophils Relative % 61 %   Neutro Abs 3.6 1.7 - 7.7 K/uL   Lymphocytes Relative 29 %   Lymphs Abs 1.7 0.7 - 4.0 K/uL   Monocytes Relative 8 %   Monocytes Absolute 0.5 0.1 - 1.0 K/uL   Eosinophils Relative 1 %   Eosinophils Absolute 0.0 0.0 - 0.5 K/uL   Basophils Relative 1 %   Basophils Absolute 0.0 0.0 - 0.1 K/uL   Immature Granulocytes 0 %   Abs Immature Granulocytes 0.02 0.00 - 0.07 K/uL    Comment: Performed at [facility], [address] [place], [place] [address]  Comprehensive metabolic panel     Status: Abnormal   Collection Time: 07/19/22  2:12 PM  Result Value Ref Range   Sodium 140 135 - 145 mmol/L   Potassium 3.7 3.5 - 5.1 mmol/L    Comment: HEMOLYSIS AT THIS LEVEL MAY AFFECT RESULT   Chloride 100 98 - 111 mmol/L   CO2 28 22 - 32 mmol/L   Glucose, Bld 125 (H) 70 - 99 mg/dL    Comment: Glucose reference range applies only to samples taken after fasting for at least 8 hours.   BUN 12 8 - 23 mg/dL   Creatinine, Ser 0.94 0.44 - 1.00 mg/dL   Calcium 10.6 (H) 8.9 - 10.3 mg/dL   Total Protein 8.5 (H) 6.5 - 8.1 g/dL   Albumin 4.4 3.5 - 5.0 g/dL   AST 24 15 - 41 U/L   ALT 12 0 - 44 U/L   Alkaline  Phosphatase 71 38 - 126 U/L   Total Bilirubin 0.9 0.3 - 1.2 mg/dL   GFR, Estimated >60 >60 mL/min    Comment: (NOTE) Calculated using the CKD-EPI Creatinine Equation (2021)    Anion gap 12 5 - 15    Comment: Performed at [facility], [address] [place], [place] [address]  CBG monitoring, ED     Status: Abnormal   Collection Time: 07/19/22  2:21 PM  Result Value Ref Range   Glucose-Capillary 101 (H) 70 - 99 mg/dL    Comment: Glucose reference range applies only to samples taken after fasting for at least 8 hours.  Troponin I (High Sensitivity)     Status: None   Collection Time: 07/19/22  4:30 PM  Result Value Ref Range   Troponin I (High Sensitivity) 5 <18 ng/L    Comment: (NOTE) Elevated high sensitivity troponin I (hsTnI) values and significant  changes across serial measurements may suggest ACS but many other  chronic  and acute conditions are known to elevate hsTnI results.  Refer to the "Links" section for chest pain algorithms and additional  guidance. Performed at [facility], [address] [place], [place] [address]   Brain natriuretic peptide     Status: None   Collection Time: 07/19/22  4:30 PM  Result Value Ref Range   B Natriuretic Peptide 10.0 0.0 - 100.0 pg/mL    Comment: Performed at [facility], [address] [place], [place] [address]  Urine Drug Screen, Qualitative     Status: None   Collection Time: 07/19/22  8:35 PM  Result Value Ref Range   Tricyclic, Ur Screen NONE DETECTED NONE DETECTED   Amphetamines, Ur Screen NONE DETECTED NONE DETECTED   MDMA (Ecstasy)Ur Screen NONE DETECTED NONE DETECTED   Cocaine Metabolite,Ur [place] NONE DETECTED NONE DETECTED   Opiate, Ur Screen NONE DETECTED NONE DETECTED   Phencyclidine (PCP) Ur S NONE DETECTED NONE DETECTED   Cannabinoid 50 Ng, Ur [place] NONE DETECTED NONE DETECTED   Barbiturates, Ur Screen NONE DETECTED NONE DETECTED   Benzodiazepine, Ur Scrn NONE DETECTED NONE DETECTED    Methadone Scn, Ur NONE DETECTED NONE DETECTED    Comment: (NOTE) Tricyclics + metabolites, urine    Cutoff 1000 ng/mL Amphetamines + metabolites, urine  Cutoff 1000 ng/mL MDMA (Ecstasy), urine              Cutoff 500 ng/mL Cocaine Metabolite, urine          Cutoff 300 ng/mL Opiate + metabolites, urine        Cutoff 300 ng/mL Phencyclidine (PCP), urine         Cutoff 25 ng/mL Cannabinoid, urine                 Cutoff 50 ng/mL Barbiturates + metabolites, urine  Cutoff 200 ng/mL Benzodiazepine, urine              Cutoff 200 ng/mL Methadone, urine                   Cutoff 300 ng/mL  The urine drug screen provides only a preliminary, unconfirmed analytical test result and should not be used for non-medical purposes. Clinical consideration and professional judgment should be applied to any positive drug screen result due to possible interfering substances. A more specific alternate chemical method must be used in order to obtain a confirmed analytical result. Gas chromatography / mass spectrometry (GC/MS) is the preferred confirm atory method. Performed at [facility], [address] [place], [place] [address]   Urinalysis, Routine w reflex microscopic Urine, Clean Catch     Status: Abnormal   Collection Time: 07/19/22  8:35 PM  Result Value Ref Range   Color, Urine YELLOW (A) YELLOW   APPearance CLEAR (A) CLEAR   Specific Gravity, Urine >1.046 (H) 1.005 - 1.030   pH 8.0 5.0 - 8.0   Glucose, UA NEGATIVE NEGATIVE mg/dL   Hgb urine dipstick NEGATIVE NEGATIVE   Bilirubin Urine NEGATIVE NEGATIVE   Ketones, ur NEGATIVE NEGATIVE mg/dL   Protein, ur NEGATIVE NEGATIVE mg/dL   Nitrite NEGATIVE NEGATIVE   Leukocytes,Ua NEGATIVE NEGATIVE    Comment: Performed at [facility], [address] [place], [place] [address]    In Ed pt received  Meds ordered this encounter  Medications   iohexol (OMNIPAQUE) 350 MG/ML injection 100 mL   LORazepam (ATIVAN) injection 1 mg    lactated ringers infusion   apixaban (ELIQUIS) tablet 5  mg   latanoprost (XALATAN) 0.005 % ophthalmic solution 1 drop   levothyroxine (SYNTHROID) tablet 50 mcg    On empty stomach     moxifloxacin (VIGAMOX) 0.5 % ophthalmic solution 1 drop   sodium chloride flush (NS) 0.9 % injection 3 mL   OR Linked Order Group    acetaminophen (TYLENOL) tablet 650 mg    acetaminophen (TYLENOL) suppository 650 mg   HYDROcodone-acetaminophen (NORCO/VICODIN) 5-325 MG per tablet 1 tablet   morphine (PF) 2 MG/ML injection 2 mg    Unresulted Labs (From admission, onward)     Start     Ordered   07/20/22 0500  Comprehensive metabolic panel  Tomorrow morning,   STAT        07/19/22 2040   07/20/22 0500  CBC  Tomorrow morning,   STAT        07/19/22 2040             Admission Imaging : MR BRAIN WO CONTRAST  Result Date: 07/19/2022 CLINICAL DATA:  Altered mental status. EXAM: MRI HEAD WITHOUT CONTRAST TECHNIQUE: Multiplanar, multiecho pulse sequences of the brain and surrounding structures were obtained without intravenous contrast. COMPARISON:  None Available. FINDINGS: Brain: No acute infarction, hemorrhage, hydrocephalus, extra-axial collection or mass lesion. Incidentally noted is a partially empty sella. Sequela of mild chronic microvascular ischemic change. C Vascular: Normal flow voids. Skull and upper cervical spine: Normal marrow signal. Sinuses/Orbits: Bilateral lens replacement. Other: None IMPRESSION: 1. No acute intracranial abnormality. 2. Mild chronic microvascular ischemic change. Electronically Signed   By: Marin Roberts M.D.   On: 07/19/2022 18:22   US Venous Img Lower Bilateral  Result Date: 07/19/2022 CLINICAL DATA:  Lower extremity swelling EXAM: BILATERAL LOWER EXTREMITY VENOUS DOPPLER ULTRASOUND TECHNIQUE: Gray-scale sonography with graded compression, as well as color Doppler and duplex ultrasound were performed to evaluate the lower extremity deep venous systems from the level of  the common femoral vein and including the common femoral, femoral, profunda femoral, popliteal and calf veins including the posterior tibial, peroneal and gastrocnemius veins when visible. The superficial great saphenous vein was also interrogated. Spectral Doppler was utilized to evaluate flow at rest and with distal augmentation maneuvers in the common femoral, femoral and popliteal veins. COMPARISON:  None Available. FINDINGS: RIGHT LOWER EXTREMITY VENOUS Normal compressibility of the RIGHT common femoral, superficial femoral, and popliteal veins, as well as the visualized calf veins. Visualized portions of profunda femoral vein and great saphenous vein unremarkable. No filling defects to suggest DVT on grayscale or color Doppler imaging. Doppler waveforms show normal direction of venous flow, normal respiratory plasticity and response to augmentation. OTHER No evidence of superficial thrombophlebitis or abnormal fluid collection. Limitations: none LEFT LOWER EXTREMITY VENOUS Normal compressibility of the LEFT common femoral, superficial femoral, and popliteal veins, as well as the visualized calf veins. Visualized portions of profunda femoral vein and great saphenous vein unremarkable. No filling defects to suggest DVT on grayscale or color Doppler imaging. Doppler waveforms show normal direction of venous flow, normal respiratory plasticity and response to augmentation. OTHER No evidence of superficial thrombophlebitis or abnormal fluid collection. Limitations: none IMPRESSION: No evidence of femoropopliteal DVT or superficial thrombophlebitis within either lower extremity. Michaelle Birks, MD [facility] Radiology Electronically Signed   By: Michaelle Birks M.D.   On: 07/19/2022 17:30   CT Angio Chest Pulmonary Embolism (PE) W or WO Contrast  Result Date: 07/19/2022 CLINICAL DATA:  Shortness of breath EXAM: CT ANGIOGRAPHY CHEST WITH CONTRAST  TECHNIQUE:  Multidetector CT imaging of the chest was performed using the standard protocol during bolus administration of intravenous contrast. Multiplanar CT image reconstructions and MIPs were obtained to evaluate the vascular anatomy. RADIATION DOSE REDUCTION: This exam was performed according to the departmental dose-optimization program which includes automated exposure control, adjustment of the mA and/or kV according to patient size and/or use of iterative reconstruction technique. CONTRAST:  139m OMNIPAQUE IOHEXOL 350 MG/ML SOLN COMPARISON:  CTA Chest 07/16/22 FINDINGS: Cardiovascular: Assessment for the presence of PE is markedly limited due to a combination of respiratory motion artifact and poor opacification of the pulmonary arteries. Of note, the thrombus seen on recent prior CTA chest on 07/16/22 is not visualized on this exam. No evidence of pulmonary embolism in the main pulmonary arteries. Normal heart size. No pericardial effusion. Mediastinum/Nodes: No enlarged mediastinal, hilar, or axillary lymph nodes. Thyroid gland, trachea, and esophagus demonstrate no significant findings. Lungs/Pleura: No suspicious pulmonary nodules are visualized. Note that assessment is limited due to respiratory motion artifact. No pleural effusion or pneumothorax. Upper Abdomen: No acute abnormality. Musculoskeletal: No chest wall abnormality. No acute or significant osseous findings. Small intramuscular lipoma along the anterior chest wall (series 11, image 799). Review of the MIP images confirms the above findings. IMPRESSION: Assessment for the presence of PE is markedly limited due to a combination of respiratory motion artifact and poor opacification of the pulmonary arteries. Of note, the thrombus seen on recent prior CTA chest on 07/16/22 is not visualized on this exam. No evidence of pulmonary embolism in the main pulmonary arteries. No CT evidence of right heart strain. Electronically Signed   By: HMarin RobertsM.D.    On: 07/19/2022 15:24   CT ANGIO HEAD NECK W WO CM (CODE STROKE)  Result Date: 07/19/2022 CLINICAL DATA:  Neuro deficit, acute, stroke suspected EXAM: CT ANGIOGRAPHY HEAD AND NECK TECHNIQUE: Multidetector CT imaging of the head and neck was performed using the standard protocol during bolus administration of intravenous contrast. Multiplanar CT image reconstructions and MIPs were obtained to evaluate the vascular anatomy. Carotid stenosis measurements (when applicable) are obtained utilizing NASCET criteria, using the distal internal carotid diameter as the denominator. RADIATION DOSE REDUCTION: This exam was performed according to the departmental dose-optimization program which includes automated exposure control, adjustment of the mA and/or kV according to patient size and/or use of iterative reconstruction technique. CONTRAST:  1062mOMNIPAQUE IOHEXOL 350 MG/ML SOLN COMPARISON:  CT head from the same day. FINDINGS: CTA NECK FINDINGS Aortic arch: Great vessel origins are patent without significant stenosis. Right carotid system: No evidence of dissection, stenosis (50% or greater), or occlusion. Left carotid system: No evidence of dissection, stenosis (50% or greater), or occlusion. Vertebral arteries: Codominant. No evidence of dissection, stenosis (50% or greater), or occlusion. Skeleton: No acute findings. Other neck: No acute findings. Upper chest: Visualized lung apices are clear. Review of the MIP images confirms the above findings CTA HEAD FINDINGS Anterior circulation: Bilateral intracranial ICAs are patent with mild narrowing due to calcific atherosclerosis. Bilateral MCAs and ACAs are patent without proximal hemodynamically significantly stenosis. Posterior circulation: Bilateral intradural vertebral arteries, basilar artery and bilateral posterior cerebral arteries are patent without proximal hemodynamically significant stenosis. Venous sinuses: As permitted by contrast timing, patent. Review of  the MIP images confirms the above findings IMPRESSION: No emergent large vessel occlusion or proximal hemodynamically significant stenosis. Electronically Signed   By: FrMargaretha Sheffield.D.   On: 07/19/2022 15:04   CT HEAD CODE STROKE WO  CONTRAST`  Result Date: 07/19/2022 CLINICAL DATA:  Code stroke.  [place] EXAM: CT HEAD WITHOUT CONTRAST TECHNIQUE: Contiguous axial images were obtained from the base of the skull through the vertex without intravenous contrast. RADIATION DOSE REDUCTION: This exam was performed according to the departmental dose-optimization program which includes automated exposure control, adjustment of the mA and/or kV according to patient size and/or use of iterative reconstruction technique. COMPARISON:  None Available. FINDINGS: Brain: No evidence of acute infarction, hemorrhage, hydrocephalus, extra-axial collection or mass lesion/mass effect. Vascular: No hyperdense vessel or unexpected calcification. Skull: Normal. Negative for fracture or focal lesion. Sinuses/Orbits: Bilateral lens replacements. Other: None. ASPECTS ([place] Stroke Program Early CT Score): 10 IMPRESSION: 1. No hemorrhage or CT evidence of an acute infarct. 2. ASPECTS is 10 Discussed with Dr. Leonel Ramsay on 07/19/22 at 2:45 PM via telephone. Electronically Signed   By: Marin Roberts M.D.   On: 07/19/2022 14:51      Physical Examination: Vitals:   07/19/22 1700 07/19/22 1730 07/19/22 1930 07/19/22 2000  BP: (!) 140/73 (!) 158/80 (!) 146/65 (!) 159/68  Pulse: 96 90 87 94  Temp:   98 F (36.7 C)   Resp: 20 19 19 16   Height:      Weight:      SpO2: 97% 100% 98% 100%  TempSrc:   Oral   BMI (Calculated):       Physical Exam Vitals and nursing note reviewed.  Constitutional:      General: She is not in acute distress.    Appearance: Normal appearance. She is not ill-appearing, toxic-appearing or diaphoretic.  HENT:     Head: Normocephalic and atraumatic.     Right Ear: Hearing and external ear  normal.     Left Ear: Hearing and external ear normal.     Nose: Nose normal. No nasal deformity.     Mouth/Throat:     Lips: Pink.     Mouth: Mucous membranes are moist.     Tongue: No lesions.     Pharynx: Oropharynx is clear.  Eyes:     Extraocular Movements: Extraocular movements intact.     Pupils: Pupils are equal, round, and reactive to light.  Neck:     Vascular: No carotid bruit.  Cardiovascular:     Rate and Rhythm: Normal rate and regular rhythm.     Pulses: Normal pulses.     Heart sounds: Normal heart sounds.  Pulmonary:     Effort: Pulmonary effort is normal.     Breath sounds: Normal breath sounds.  Abdominal:     General: Bowel sounds are normal. There is no distension.     Palpations: Abdomen is soft. There is no mass.     Tenderness: There is no abdominal tenderness. There is no guarding.     Hernia: No hernia is present.  Musculoskeletal:     Right lower leg: No edema.     Left lower leg: No edema.  Skin:    General: Skin is warm.  Neurological:     General: No focal deficit present.     Mental Status: She is alert and oriented to person, place, and time.     Cranial Nerves: Cranial nerves 2-12 are intact.     Motor: Motor function is intact.  Psychiatric:        Attention and Perception: Attention normal.        Mood and Affect: Mood normal.        Speech: Speech is delayed.  Behavior: Behavior normal. Behavior is cooperative.        Cognition and Memory: Cognition normal.      Assessment and Plan: * Dyspnea Per husband pt became dyspneic and he got worried , called EMS. Pt was breathing fast.  D/W pt and husband and family at bedside this is could be from PE.    Palpitation Will monitor overnight and then follow. 2 d echo was done end September. 2022: Left ventricular ejection fraction, by estimation, is 60 to 65%. The left ventricle has normal function. The left ventricle has no regional wall motion abnormalities. Left ventricular  diastolic parameters were normal. The average left ventricular global longitudinal strain is -16.3 %. 1. 2. Right ventricular systolic function is normal. The right ventricular size is normal. 3. The mitral valve is normal in structure. No evidence of mitral valve regurgitation. 4. The aortic valve is tricuspid. Aortic valve regurgitation is not visualized. The inferior vena cava is normal in size with greater than 50% respiratory variability, suggesting right atrial pressure of 3 mmHg.   Pulmonary embolism ([place]) Continue patient on Eliquis.   Cognitive deficits as late effect of cerebrovascular disease Patient has acute onset altered mental status and catatonic behavior pattern and nonverbal. MRI negative. EEG. Psychiatry consult as deemed appropriate.  Crohn's disease of both small and large intestine with rectal bleeding (HCC) No stomach complaints.  No repots of nausea/ vomiting of dioarrhea  Benign essential HTN Vitals:   07/19/22 1430 07/19/22 1500 07/19/22 1530 07/19/22 1600  BP: (!) 170/67 (!) 163/63 (!) 145/65 (!) 157/77   07/19/22 1630 07/19/22 1700 07/19/22 1730 07/19/22 1930  BP: 122/61 (!) 140/73 (!) 158/80 (!) 146/65   07/19/22 2000  BP: (!) 159/68  Continue patient on apixaban. Losartan HCTZ currently held secondary to suspected hypotension predisposing symptoms of dizziness orthostatic hypotension and stroke.    DVT prophylaxis:  Eliquis  Code Status:  Full code  Family Communication:  Santarelli,[name] (Spouse)  [phone]   Disposition Plan:  Home  Consults called:  None none  Admission status: Observation  Unit/ Expected LOS: Med telemetry progressive   Para Skeans MD [facility]  6 PM- 2 AM. Please contact me via secure Chat 6 PM-2 AM. [phone] ( Pager ) To contact the [facility] Attending or Consulting provider [place] or covering provider during after hours [place], for this patient.   Check the care team in [facility] and look for a)  attending/consulting [facility] provider listed and b) the [facility] team listed Log into [email or web address] and use [place]'s universal password to access. If you do not have the password, please contact the hospital operator. Locate the [facility] provider you are looking for under [facility] and page to a number that you can be directly reached. If you still have difficulty reaching the provider, please page the [facility] (Director on Call) for the Hospitalists listed on amion for assistance. [email or web address] 07/19/2022, 10:08 PM

## 2022-07-19 NOTE — Assessment & Plan Note (Signed)
Will monitor overnight and then follow. 2 d echo was done end September. 2022: Left ventricular ejection fraction, by estimation, is 60 to 65%. The left ventricle has normal function. The left ventricle has no regional wall motion abnormalities. Left ventricular diastolic parameters were normal. The average left ventricular global longitudinal strain is -16.3 %. 1. 2. Right ventricular systolic function is normal. The right ventricular size is normal. 3. The mitral valve is normal in structure. No evidence of mitral valve regurgitation. 4. The aortic valve is tricuspid. Aortic valve regurgitation is not visualized. The inferior vena cava is normal in size with greater than 50% respiratory variability, suggesting right atrial pressure of 3 mmHg.

## 2022-07-19 NOTE — ED Notes (Signed)
Patient given warm blanket, requested by family.

## 2022-07-19 NOTE — Code Documentation (Signed)
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 05/2021 Echo: EF 60-65%, no rwma, nl RV fxn.   History of kidney stones    Hyperlipidemia    Hypertension    Hypothyroidism    Nonobstructive CAD (coronary artery disease)    a. 10/2012 Cath: diffuse minor irregs-->med rx; b. 08/2021 Cor CTA: Ca2+ = 55.6 (77th%'ile), LAD 25p, LCX <25p. No signif non-cardiac findings.   Occlusion, cerebral artery    NOS w/infarction   Osteoporosis    Personal history of radiation therapy 2006   BREAST CA   Pulmonary embolism ([place])    a.04/2021 CTA Chest: Small filling defects are noted in upper and lower lobe branches of L PA-->acute PE-->eliquis. *PE occurred in context of COVID infxn.   Stroke [facility])    Ulcer    Prior to Admission medications   Medication Sig Start Date End Date Taking? Authorizing Provider  Apixaban Starter Pack, 742m and 571m (ELIQUIS DVT/PE STARTER PACK) Take as directed on package: start with two-42m78mablets twice daily for 7 days. On day  8, switch to one-42mg61mblet twice daily. 07/17/22  Yes KrisAnnita Brod  latanoprost (XALATAN) 0.005 % ophthalmic solution Place 1 drop into both eyes at bedtime.  04/22/16  Yes [provider]  levothyroxine (SYNTHROID) 50 MCG tablet Take 1 tablet (50 mcg total) by mouth daily before breakfast. On empty stomach 06/01/22  Yes TapiDelsa Grana-C  losartan-hydrochlorothiazide (HYZAAR) 50-12.5 MG tablet Take 1 tablet by mouth daily. 08/06/21  Yes BergTheora Gianotti  moxifloxacin (VIGAMOX) 0.5 % ophthalmic solution Apply to eye. 06/04/22  Yes [provider]  Multiple Vitamin (MULTIVITAMIN) tablet Take 1 tablet by mouth daily.   Yes [provider]  neomycin-polymyxin b-dexamethasone (MAXITROL) 3.5-10000-0.1 OINT SMARTSIG:sparingly In Eye(s) Twice Daily 05/10/22  Yes [provider]  rosuvastatin (CRESTOR) 40 MG tablet Take 1 tablet (40 mg total) by mouth daily. 06/01/22 05/27/23 Yes TapiDelsa Grana-C  sertraline (ZOLOFT) 50 MG tablet Take 1 tablet (50 mg total) by mouth daily. 06/03/22 09/01/22 Yes Hisada, ReinElie Goody  apixaban (ELIQUIS) 5 MG TABS tablet Take 1 tablet (5 mg total) by mouth 2 (two) times daily. 08/14/22   KrisAnnita Brod  rivastigmine (EXELON) 1.5 MG capsule Take 1.5 mg by mouth See admin instructions. Take 1 capsule (1.5 mg total) by mouth once daily for 14 days, THEN 1 capsule (1.5 mg total) 2 (two) times daily with  meals for 16 days. 07/15/22 08/14/22  [provider]  triamcinolone ointment (KENALOG) 0.1 % Apply to affected skin one to two times daily for up to two weeks Patient taking differently: 1 Application 2 (two) times daily as needed. Apply to affected skin one to two times daily for up to two weeks 11/05/20   Delsa Grana, PA-C  valACYclovir (VALTREX) 1000 MG tablet Take 1 tablet (1,000 mg total) by mouth 2 (two) times daily. Patient taking differently: Take 1,000 mg by mouth 2 (two) times daily as needed. 12/18/21    Delsa Grana, PA-C    Darrick Penna ,PharmD Clinical Pharmacist  07/19/2022  3:05 PM

## 2022-07-19 NOTE — Assessment & Plan Note (Signed)
Continue patient on Eliquis.

## 2022-07-19 NOTE — ED Notes (Signed)
Family states that patient woke up and told them she had to use the bathroom. Family assisted her to the bathroom and urine was discarded. Family states immediately afterwards patient became non verbal again and not following commands.

## 2022-07-19 NOTE — ED Notes (Signed)
Patient transported to MRI 

## 2022-07-19 NOTE — ED Notes (Signed)
CODE  STROKE  CALLED  TO  Carolyn Campbell

## 2022-07-19 NOTE — Code Documentation (Signed)
Stroke Response Nurse Documentation Code Documentation  SHAHIDA SCHNACKENBERG is a 66 y.o. female arriving to [facility] via [facility] on 07/19/2022 with past medical hx of HTN, HLD, hypothyroidism, clotting disorder, CAD, previous stroke/CVA, PE. On Eliquis (apixaban) daily. Code stroke was activated by ED.   Patient from home where she was LKW at around 1330 and now complaining of altered mental status. Pt was recently admitted, treated and discharged from the hospital for recent PE. Per husband, patient was neurologically in her normal state this morning, however, was having difficulty breathing. Pt's husband noted that pt had called and was speaking fluently to the  telephone hotline that recommended pt to call 911 and in normal state prior to leaving with EMS. When pt's husband saw pt in ED after arrival via EMS she was noted to have eyes open, but not following commands or speaking. He alerted staff that she was not acting like her normal self. ED RN had EDP evaluate the pt and a code stroke was called.   Stroke team met patient in CT after code stroke activation. Labs drawn and patient cleared for CT by Dr. Myrene Buddy. Patient to CT with team. NIHSS 21, see documentation for details and code stroke times. Patient with disoriented, not following commands, bilateral hemianopia, bilateral arm weakness, bilateral leg weakness, Global aphasia , and dysarthria  on exam. The following imaging was completed:  CT Head and CTA. Patient is not a candidate for IV Thrombolytic due to stroke not suspected per MD. Patient is not a candidate for IR due to no LVO on imaging per MD.   Care Plan: Q2 NIHSS + Vital signs, obtain MRI.   Bedside handoff with ED RN Tiffany.    Charise Carwin  Stroke Response RN

## 2022-07-19 NOTE — Hospital Course (Signed)
Pt came in on

## 2022-07-19 NOTE — ED Provider Notes (Signed)
[facility] Provider Note    Event Date/Time   First MD Initiated Contact with Patient 07/19/22 1455     (approximate)   History   Shortness of Breath   HPI  Carolyn Campbell is a 66 y.o. female here with shortness of breath and altered mental status.  History is limited as patient is not responding on arrival.  Per report, patient was recently diagnosed with PE and has been on Eliquis.  She reportedly has had worsening shortness of breath and chest pain and initially called out EMS for this.  However, with EMS, she acutely became less responsive and now is staring, not responding to examiner.  Patient has a possible history of stroke.  She was activated as a code stroke on arrival.  No seizure-like activity per EMS.     Physical Exam   Triage Vital Signs: ED Triage Vitals  Enc Vitals Group     BP --      Pulse --      Resp --      Temp --      Temp src --      SpO2 07/19/22 1412 98 %     Weight 07/19/22 1413 140 lb (63.5 kg)     Height 07/19/22 1413 5' (1.524 m)     Head Circumference --      Peak Flow --      Pain Score 07/19/22 1413 0     Pain Loc --      Pain Edu? --      Excl. in [place]? --     Most recent vital signs: Vitals:   07/19/22 1700 07/19/22 1730  BP: (!) 140/73 (!) 158/80  Pulse: 96 90  Resp: 20 19  Temp:    SpO2: 97% 100%     General: Awake, no distress.  CV:  Good peripheral perfusion.  Regular rate and rhythm. Resp:  Normal effort.  Lungs clear to auscultation bilaterally. Abd:  No distention.  No tenderness. Other:  Staring straight ahead, will not answer questions or respond.  She does move her eyes towards examiner and crosses midline bilaterally.  Does not respond to commands.  Face appears symmetric.  Normal respirations.  She seems to be tolerating secretions.  No seizure-like activity.  Normal tone throughout.  Upon lifting the patient's arms, she does divert them away from her face bilaterally.  When sitting her  upright, she also maintains her upright position.   ED Results / Procedures / Treatments   Labs (all labs ordered are listed, but only abnormal results are displayed) Labs Reviewed  PROTIME-INR - Abnormal; Notable for the following components:      Result Value   Prothrombin Time 20.6 (*)    INR 1.8 (*)    All other components within normal limits  COMPREHENSIVE METABOLIC PANEL - Abnormal; Notable for the following components:   Glucose, Bld 125 (*)    Calcium 10.6 (*)    Total Protein 8.5 (*)    All other components within normal limits  CBG MONITORING, ED - Abnormal; Notable for the following components:   Glucose-Capillary 101 (*)    All other components within normal limits  ETHANOL  APTT  CBC  DIFFERENTIAL  BRAIN NATRIURETIC PEPTIDE  URINE DRUG SCREEN, QUALITATIVE ([facility] ONLY)  URINALYSIS, ROUTINE W REFLEX MICROSCOPIC  TROPONIN I (HIGH SENSITIVITY)     EKG Sinus tachycardia, VR 106. PR 138, QRS 131, QTc 516. No acute ST elevations or  depressions. No infarct.   RADIOLOGY CT Head: NAICA CT Angio Head/NEck: No LVO CT Angio PE: Motion limited but within limitations unremarkable   I also independently reviewed and agree with radiologist interpretations.   PROCEDURES:  Critical Care performed: No   .1-3 Lead EKG Interpretation  Performed by: Duffy Bruce, MD Authorized by: Duffy Bruce, MD     Interpretation: normal     ECG rate:  80-90   ECG rate assessment: normal     Rhythm: sinus rhythm     Ectopy: none     Conduction: normal   Comments:     Indication: Stroke like symptoms     MEDICATIONS ORDERED IN ED: Medications  iohexol (OMNIPAQUE) 350 MG/ML injection 100 mL (100 mLs Intravenous Contrast Given 07/19/22 1454)  LORazepam (ATIVAN) injection 1 mg (1 mg Intravenous Given 07/19/22 1558)     IMPRESSION / MDM / ASSESSMENT AND PLAN / ED COURSE  I reviewed the triage vital signs and the nursing notes.                               The  patient is on the cardiac monitor to evaluate for evidence of arrhythmia and/or significant heart rate changes.   Ddx:  Differential includes the following, with pertinent life- or limb-threatening emergencies considered:  Conversion d/o, catatonia, partial complex seizure, CVA, intracranial mass/lesion  Patient's presentation is most consistent with acute presentation with potential threat to life or bodily function.  MDM:  66 year old female here with shortness of breath, decreased responsiveness.  Regarding her shortness of breath, differential includes recurrent PE, ACS, anxiety, anemia.  Initial lab work overall reassuring.  CBC without leukocytosis or significant anemia.  CMP unremarkable although she does have mild hypercalcemia.  EKG nonischemic.  She is not hypoxic, and is on anticoagulation, do not suspect significant recurrence of PE.  We will plan to repeat troponin and continue anticoagulation from this perspective.  Regarding her altered mental status, this seems to be a more acute issue.  Patient activated as a code stroke, taken emergently to CT.  CT angio shows no vessel occlusion.  CT head is negative.  Dr. Leonel Ramsay of neurology has evaluated, suspect this could be nonorganic.  Exam is consistent with this.  Patient will be given Ativan for possible catatonia.  This could be related to stress reaction from her recent PE diagnosis with worsening shortness of breath, anxiety related to this, less likely hypercalcemia.  Plan to obtain MRI, monitor.  If patient returns to baseline MRI negative, could consider disposition but if symptoms persist, consider observation.  Family appropriately concerned about pt's condition but remain anxious despite attempts to reassure that pt's condition is stable and that her current neurological sx will likely resolve. I spent >20 min with family discussing lab results, imaging, and plan of care.   MEDICATIONS GIVEN IN ED: Medications  iohexol  (OMNIPAQUE) 350 MG/ML injection 100 mL (100 mLs Intravenous Contrast Given 07/19/22 1454)  LORazepam (ATIVAN) injection 1 mg (1 mg Intravenous Given 07/19/22 1558)     Consults:  Dr. Leonel Ramsay, Neurology   EMR reviewed  Reviewed recent admission notes     FINAL CLINICAL IMPRESSION(S) / ED DIAGNOSES   Final diagnoses:  None     Rx / DC Orders   ED Discharge Orders     None        Note:  This document was prepared using Dragon voice recognition software and  may include unintentional dictation errors.   Duffy Bruce, MD 07/19/22 Dorthula Perfect

## 2022-07-19 NOTE — Consult Note (Signed)
Neurology Consultation Reason for Consult: decreased responsiveness Referring Physician: Ellender Hose, C  CC: Decreased responsiveness  History is obtained from:patient  HPI: Carolyn Campbell is a 66 y.o. female with a history of recent diagnosis of pulmonary embolism on anticoagulation with Eliquis who presents with decreased responsiveness.  Speaking with the husband, she was complaining of some shortness of breath earlier today, then they got to the point where her husband called 911.  She had no concerns for her confusion or mental status prior to the ambulance arriving, but as he put her in the back of the, but no definite the decreased responsiveness at that time.  While in transit, she stopped responding to the ambulance drivers.  She did not go to sleep or become obtunded, she just since simply stopped responding.  On arrival, it was noted that she was not responding and therefore a code stroke was activated.   LKW: 11 AM tpa given?: no, anticoagulated Premorbid modified rankin scale: Zero   Past Medical History:  Diagnosis Date   Abdominal pain, epigastric    Allergic rhinitis    Arthritis    Asthma    Breast cancer ([place]) 2006   LT LUMPECTOMY   Clotting disorder ([place])    Cognitive deficits as late effect of cerebrovascular disease    COPD (chronic obstructive pulmonary disease) ([place])    COVID-19 virus infection 04/2021   Crohn's disease ([place]) 05/21/2015   FOLLOWED BY GI   Crohn's disease of both small and large intestine with rectal bleeding ([place]) 12/04/2014   Depression    Currently taking zoloft.   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 05/2021 Echo: EF 60-65%, no rwma, nl RV fxn.   History of kidney stones    Hyperlipidemia    Hypertension    Hypothyroidism    Nonobstructive CAD (coronary  artery disease)    a. 10/2012 Cath: diffuse minor irregs-->med rx; b. 08/2021 Cor CTA: Ca2+ = 55.6 (77th%'ile), LAD 25p, LCX <25p. No signif non-cardiac findings.   Occlusion, cerebral artery    NOS w/infarction   Osteoporosis    Personal history of radiation therapy 2006   BREAST CA   Pulmonary embolism ([place])    a.04/2021 CTA Chest: Small filling defects are noted in upper and lower lobe branches of L PA-->acute PE-->eliquis. *PE occurred in context of COVID infxn.   Stroke [facility])    Ulcer      Family History  Problem Relation Age of Onset   Heart disease Mother    Heart attack Mother    Cerebrovascular Accident Father    Arthritis Father    Hypertension Father    Breast cancer Sister    Breast cancer Sister 76   Cancer Sister    Hypertension Other    Diabetes Other    Heart attack Other 73     Social History:  reports that she has never smoked. She has never used smokeless tobacco. She reports that she does not drink alcohol and does not use drugs.   Exam: Current vital signs: BP (!) 158/80   Pulse 90   Temp (!) 97.5 F (36.4 C) (Axillary)   Resp 19   Ht 5' (1.524 m)   Wt 63.5 kg   SpO2 100%   BMI 27.34 kg/m  Vital signs in last 24 hours: Temp:  [97.5 F (36.4 C)] 97.5 F (36.4 C) (10/30 1500) Pulse Rate:  [90-113] 90 (10/30 1730) Resp:  [18-21] 19 (10/30 1730) BP: (122-170)/(61-80) 158/80 (10/30 1730) SpO2:  [96 %-100 %] 100 % (10/30 1730) Weight:  [63.5 kg] 63.5 kg (10/30 1413)   Physical Exam  Constitutional: Appears well-developed and well-nourished.  Psych: Does not speak Eyes: No scleral injection HENT: No OP obstruction MSK: no joint deformities.  Cardiovascular: Normal rate and regular rhythm.  Respiratory: Effort normal, non-labored breathing GI: Soft.  No distension. There is no tenderness.  Skin: WDI  Neuro: Mental Status: Patient is awake, alert, she does not answer any questions, but she does fixate and track the examiner. Cranial  Nerves: II: I checked blink to threat on the left first and she did blink to threat, when I checked on the right and subsequent times on the left she did not blink to threat.. Pupils are equal, round, and reactive to light.   III,IV, VI: EOMI without ptosis or diploplia.  V: Facial sensation is symmetric to temperature VII: Facial movement is symmetric.  VIII: hearing is intact to voice X: Uvula elevates symmetrically XI: Shoulder shrug is symmetric. XII: tongue is midline without atrophy or fasciculations.  Motor: She has giveaway weakness of all four extremities, she makes no effort to keep them aloft until they are position above her and then she actively pushes on down to flopped to the sides. Sensory: She grimaces to noxious stimulation when applied without warning Cerebellar: Does not perform     I have reviewed labs in epic and the results pertinent to this consultation are: Creatinine 0.94 Calcium 10.6 with normal albumin Sodium 140 CBG 101  I have reviewed the images obtained: CT/CTA-negative  Impression: 66 year old female who presents with decreased interactivity.  She is awake and tracks the examiner, confirming consciousness.  She has an inconsistent exam and actively resists in order to allow her arms to flopped to the side.  My strong suspicion is that this represents nonorganic etiology.  For family actually noticed removing some intubated signs of encouragement, and I tried to encourage her to continue to improve by stating that this was an early sign and I expect her to continue to improve.  Recommendations: 1) MRI brain 2) if she improves and returns to baseline, no further testing at this time. 3) if she ends up needing admission, then I would perform an EEG tomorrow, but I think this is very low yield. 4) if above testing is negative, then would favor considering this a psychogenic etiology.   Roland Rack, MD [facility] [phone]  If 7pm- 7am, please page neurology on call as listed in [place].

## 2022-07-19 NOTE — ED Triage Notes (Addendum)
"  Call came out for difficulty breathing. Diagnosed with a PE on Saturday and sent home on Eliquis. Shortness of breath has not gotten better and progressively gotten worse since being home. Upon our arrival she was 96% on room air. She was A&O x 4. Walked to the truck. While in route to the hospital she became catatonic. Eyes open, but not speaking or following commands" per EMS

## 2022-07-19 NOTE — ED Notes (Signed)
Called to [facility]/cancel activation of Code Stroke per Darius Bump

## 2022-07-19 NOTE — Assessment & Plan Note (Signed)
No stomach complaints.  No repots of nausea/ vomiting of dioarrhea

## 2022-07-19 NOTE — Telephone Encounter (Signed)
Pt c/o Shortness Of Breath: STAT if SOB developed within the last 24 hours or pt is noticeably SOB on the phone  1. Are you currently SOB (can you hear that pt is SOB on the phone)?  Yes   2. How long have you been experiencing SOB?  Started when the patient woke up this morning  3. Are you SOB when sitting or when up moving around?  Both, SOB is continuous   4. Are you currently experiencing any other symptoms?  Lightheadedness with movement

## 2022-07-19 NOTE — Progress Notes (Signed)
1419: Code stroke activated via cart. LWK 1100 & MRS 0.   1423: Patient taken to CT.   1425: Dr. Leonel Ramsay at bedside assessing patient. Not a candidate for TNK at this time due to eliquis.   1430: Signed off cart.

## 2022-07-19 NOTE — Assessment & Plan Note (Signed)
Patient has acute onset altered mental status and catatonic behavior pattern and nonverbal. MRI negative. EEG. Psychiatry consult as deemed appropriate.

## 2022-07-19 NOTE — Telephone Encounter (Signed)
Call transferred directly to this RN from the call center. The patient called stating she was hospitalized Friday and Saturday for a PE. She went to the ER with symptoms of chest pain, but she was not SOB. This morning, she woke up acutely more SOB than when she went to bed last night.  She is having a hard time speaking in full sentences at this time.  I have advised the patient that she should call 911 and have them come evaluate her with plans to be seen in the ER.   The patient was hesitant to do this, but I advised her I was unsure if she was having symptoms from a clot/ has she had some acute blood loss due to IV heparin/ high dose eliquis.   I did place the patient on a brief hold to review with Dr. Rockey Situ.  Per Dr. Rockey Situ- there was no additional scanning of her lower venous system/ no echo. He is uncertain if she threw some additional clot/ if she is having some fluid retention that might require diuresis.  Unable to rule out anemia with blood thinners.  Patient was seen by vascular surgery/ hospitalist service while admitted.  Dr. Rockey Situ is in agreement with the patient calling 911 and seeking further evaluation in the ER.   I have spoke with the patient regarding the above MD response.  She voices understanding and is agreeable.

## 2022-07-19 NOTE — Assessment & Plan Note (Signed)
Per husband pt became dyspneic and he got worried , called EMS. Pt was breathing fast.  D/W pt and husband and family at bedside this is could be from PE.

## 2022-07-19 NOTE — Progress Notes (Signed)
   07/19/22 1450  Clinical Encounter Type  Visited With Family  Visit Type Initial;Code  Referral From Nurse  Consult/Referral To Chaplain   Chaplain responded to Code [place]. Patient at CT scan. Chaplain provided support to family.

## 2022-07-19 NOTE — Assessment & Plan Note (Signed)
Vitals:   07/19/22 1430 07/19/22 1500 07/19/22 1530 07/19/22 1600  BP: (!) 170/67 (!) 163/63 (!) 145/65 (!) 157/77   07/19/22 1630 07/19/22 1700 07/19/22 1730 07/19/22 1930  BP: 122/61 (!) 140/73 (!) 158/80 (!) 146/65   07/19/22 2000  BP: (!) 159/68  Continue patient on apixaban. Losartan HCTZ currently held secondary to suspected hypotension predisposing symptoms of dizziness orthostatic hypotension and stroke.

## 2022-07-19 NOTE — ED Notes (Signed)
Patient moving lips and moving eyes to track family members. Remains non verbal and not following commnands. Not moving any extremity, but does have resistance against gravity in all 4 extremities and all reflexes are wnl.

## 2022-07-19 NOTE — ED Notes (Signed)
Resting with eyes open, not interactive, calm. Family at [facility]

## 2022-07-19 NOTE — ED Provider Notes (Signed)
Care assumed from Dr. Ellender Hose. Recent PE with anticoagulation and elquis. W/ EMS unresponsive. Code stroke, Dr. Debe Coder with neuro evaluated, plans for MRI and negative and back to baseline patient can be discharged home.  Otherwise would likely need an observation admission.  Concern for possible nonorganic cause or possible catatonia.  Given Ativan.  []  MRI neg and back to baseline, could dc home.  []  MRI neg and non back to baseline, admit for obs  7:21 PM  Patient was evaluated after coming back from MRI.  MRI showed no acute findings.  Ultrasound of lower extremities with no signs of DVT.  No signs of PE.  Troponins are negative.  No significant electrolyte abnormalities.  Patient is not back to baseline.  Able to follow simple commands and giving a thumbs up and moving all extremities.  Family member states that this is not her baseline.  Consulted hospitalist for admission for altered mental status.    Nathaniel Man, MD 07/19/22 [place]

## 2022-07-20 DIAGNOSIS — K50811 Crohn's disease of both small and large intestine with rectal bleeding: Secondary | ICD-10-CM

## 2022-07-20 DIAGNOSIS — Z803 Family history of malignant neoplasm of breast: Secondary | ICD-10-CM | POA: Diagnosis not present

## 2022-07-20 DIAGNOSIS — J449 Chronic obstructive pulmonary disease, unspecified: Secondary | ICD-10-CM | POA: Diagnosis present

## 2022-07-20 DIAGNOSIS — E038 Other specified hypothyroidism: Secondary | ICD-10-CM

## 2022-07-20 DIAGNOSIS — Z923 Personal history of irradiation: Secondary | ICD-10-CM | POA: Diagnosis not present

## 2022-07-20 DIAGNOSIS — I25118 Atherosclerotic heart disease of native coronary artery with other forms of angina pectoris: Secondary | ICD-10-CM | POA: Diagnosis not present

## 2022-07-20 DIAGNOSIS — I10 Essential (primary) hypertension: Secondary | ICD-10-CM | POA: Diagnosis present

## 2022-07-20 DIAGNOSIS — Z7901 Long term (current) use of anticoagulants: Secondary | ICD-10-CM | POA: Diagnosis not present

## 2022-07-20 DIAGNOSIS — R7303 Prediabetes: Secondary | ICD-10-CM | POA: Diagnosis present

## 2022-07-20 DIAGNOSIS — Q159 Congenital malformation of eye, unspecified: Secondary | ICD-10-CM

## 2022-07-20 DIAGNOSIS — Z853 Personal history of malignant neoplasm of breast: Secondary | ICD-10-CM | POA: Diagnosis not present

## 2022-07-20 DIAGNOSIS — I2510 Atherosclerotic heart disease of native coronary artery without angina pectoris: Secondary | ICD-10-CM | POA: Diagnosis present

## 2022-07-20 DIAGNOSIS — G4752 REM sleep behavior disorder: Secondary | ICD-10-CM | POA: Diagnosis present

## 2022-07-20 DIAGNOSIS — F325 Major depressive disorder, single episode, in full remission: Secondary | ICD-10-CM

## 2022-07-20 DIAGNOSIS — K219 Gastro-esophageal reflux disease without esophagitis: Secondary | ICD-10-CM

## 2022-07-20 DIAGNOSIS — Z823 Family history of stroke: Secondary | ICD-10-CM | POA: Diagnosis not present

## 2022-07-20 DIAGNOSIS — R4182 Altered mental status, unspecified: Secondary | ICD-10-CM | POA: Diagnosis present

## 2022-07-20 DIAGNOSIS — I2699 Other pulmonary embolism without acute cor pulmonale: Secondary | ICD-10-CM

## 2022-07-20 DIAGNOSIS — R4189 Other symptoms and signs involving cognitive functions and awareness: Secondary | ICD-10-CM | POA: Diagnosis present

## 2022-07-20 DIAGNOSIS — Z8616 Personal history of COVID-19: Secondary | ICD-10-CM | POA: Diagnosis not present

## 2022-07-20 DIAGNOSIS — G9341 Metabolic encephalopathy: Secondary | ICD-10-CM | POA: Diagnosis present

## 2022-07-20 DIAGNOSIS — Z8249 Family history of ischemic heart disease and other diseases of the circulatory system: Secondary | ICD-10-CM | POA: Diagnosis not present

## 2022-07-20 DIAGNOSIS — M5432 Sciatica, left side: Secondary | ICD-10-CM | POA: Diagnosis present

## 2022-07-20 DIAGNOSIS — I69922 Dysarthria following unspecified cerebrovascular disease: Secondary | ICD-10-CM | POA: Diagnosis not present

## 2022-07-20 DIAGNOSIS — Z86711 Personal history of pulmonary embolism: Secondary | ICD-10-CM | POA: Diagnosis not present

## 2022-07-20 DIAGNOSIS — E7800 Pure hypercholesterolemia, unspecified: Secondary | ICD-10-CM | POA: Diagnosis present

## 2022-07-20 DIAGNOSIS — M810 Age-related osteoporosis without current pathological fracture: Secondary | ICD-10-CM | POA: Diagnosis present

## 2022-07-20 DIAGNOSIS — E039 Hypothyroidism, unspecified: Secondary | ICD-10-CM | POA: Diagnosis present

## 2022-07-20 DIAGNOSIS — R0600 Dyspnea, unspecified: Secondary | ICD-10-CM | POA: Diagnosis not present

## 2022-07-20 DIAGNOSIS — H409 Unspecified glaucoma: Secondary | ICD-10-CM | POA: Diagnosis present

## 2022-07-20 DIAGNOSIS — H4053X2 Glaucoma secondary to other eye disorders, bilateral, moderate stage: Secondary | ICD-10-CM

## 2022-07-20 DIAGNOSIS — I69919 Unspecified symptoms and signs involving cognitive functions following unspecified cerebrovascular disease: Secondary | ICD-10-CM | POA: Diagnosis present

## 2022-07-20 DIAGNOSIS — J9587 Transfusion-associated dyspnea (tad): Secondary | ICD-10-CM | POA: Diagnosis not present

## 2022-07-20 LAB — COMPREHENSIVE METABOLIC PANEL
ALT: 13 U/L (ref 0–44)
AST: 17 U/L (ref 15–41)
Albumin: 3.6 g/dL (ref 3.5–5.0)
Alkaline Phosphatase: 58 U/L (ref 38–126)
Anion gap: 8 (ref 5–15)
BUN: 13 mg/dL (ref 8–23)
CO2: 28 mmol/L (ref 22–32)
Calcium: 9.7 mg/dL (ref 8.9–10.3)
Chloride: 104 mmol/L (ref 98–111)
Creatinine, Ser: 0.78 mg/dL (ref 0.44–1.00)
GFR, Estimated: >60 mL/min (ref >60)
Glucose, Bld: 107 mg/dL — ABNORMAL HIGH (ref 70–99)
Potassium: 3.6 mmol/L (ref 3.5–5.1)
Sodium: 140 mmol/L (ref 135–145)
Total Bilirubin: 0.9 mg/dL (ref 0.3–1.2)
Total Protein: 7 g/dL (ref 6.5–8.1)

## 2022-07-20 LAB — CBC
HCT: 34.8 % — ABNORMAL LOW (ref 36.0–46.0)
Hemoglobin: 11.3 g/dL — ABNORMAL LOW (ref 12.0–15.0)
MCH: 28.9 pg (ref 26.0–34.0)
MCHC: 32.5 g/dL (ref 30.0–36.0)
MCV: 89 fL (ref 80.0–100.0)
Platelets: 231 10*3/uL (ref 150–400)
RBC: 3.91 MIL/uL (ref 3.87–5.11)
RDW: 13.2 % (ref 11.5–15.5)
WBC: 5.7 10*3/uL (ref 4.0–10.5)
nRBC: 0 % (ref 0.0–0.2)

## 2022-07-20 MED ORDER — ROSUVASTATIN CALCIUM 20 MG PO TABS
40.0000 mg | ORAL_TABLET | Freq: Every day | ORAL | Status: DC
  Administered 2022-07-21 – 2022-07-22 (×2): 40 mg via ORAL
  Filled 2022-07-20 (×3): qty 2

## 2022-07-20 MED ORDER — SERTRALINE HCL 50 MG PO TABS
50.0000 mg | ORAL_TABLET | Freq: Every day | ORAL | Status: DC
  Administered 2022-07-21 – 2022-07-22 (×2): 50 mg via ORAL
  Filled 2022-07-20 (×2): qty 1

## 2022-07-20 NOTE — ED Notes (Signed)
Patient is alert, but does not follow commands to perform swallow screen.  Will keep NPO.  Family at bedside updated on this and made verbal understanding that patient is not to attempt to eat or drink anything at this time.

## 2022-07-20 NOTE — Progress Notes (Signed)
Subjective: Patient is talking at times, but at other times would not respond.  She was working with speech therapy when she had a behavioral arrest.  Exam: Vitals:   07/20/22 0830 07/20/22 1036  BP: (!) 119/55   Pulse: (!) 59   Resp: 16   Temp:  97.9 F (36.6 C)  SpO2: 96%    Gen: In bed, NAD Resp: non-labored breathing, no acute distress Abd: soft, nt  Neuro: MS: She continues to fixate and track without following my commands.  She apparently was just speaking shortly before my evaluation. CN: Extraocular movements intact, blinks to threat bilaterally Motor: Moves all extremities spontaneously but not to command.  Pertinent Labs: Results for orders placed or performed during the hospital encounter of 07/19/22 (from the past 24 hour(s))  Ethanol     Status: None   Collection Time: 07/19/22  2:12 PM  Result Value Ref Range   Alcohol, Ethyl (B) <10 <10 mg/dL  Protime-INR     Status: Abnormal   Collection Time: 07/19/22  2:12 PM  Result Value Ref Range   Prothrombin Time 20.6 (H) 11.4 - 15.2 seconds   INR 1.8 (H) 0.8 - 1.2  APTT     Status: None   Collection Time: 07/19/22  2:12 PM  Result Value Ref Range   aPTT 36 24 - 36 seconds  CBC     Status: None   Collection Time: 07/19/22  2:12 PM  Result Value Ref Range   WBC 5.8 4.0 - 10.5 K/uL   RBC 4.42 3.87 - 5.11 MIL/uL   Hemoglobin 12.8 12.0 - 15.0 g/dL   HCT 38.6 36.0 - 46.0 %   MCV 87.3 80.0 - 100.0 fL   MCH 29.0 26.0 - 34.0 pg   MCHC 33.2 30.0 - 36.0 g/dL   RDW 12.9 11.5 - 15.5 %   Platelets 285 150 - 400 K/uL   nRBC 0.0 0.0 - 0.2 %  Differential     Status: None   Collection Time: 07/19/22  2:12 PM  Result Value Ref Range   Neutrophils Relative % 61 %   Neutro Abs 3.6 1.7 - 7.7 K/uL   Lymphocytes Relative 29 %   Lymphs Abs 1.7 0.7 - 4.0 K/uL   Monocytes Relative 8 %   Monocytes Absolute 0.5 0.1 - 1.0 K/uL   Eosinophils Relative 1 %   Eosinophils Absolute 0.0 0.0 - 0.5 K/uL   Basophils Relative 1 %    Basophils Absolute 0.0 0.0 - 0.1 K/uL   Immature Granulocytes 0 %   Abs Immature Granulocytes 0.02 0.00 - 0.07 K/uL  Comprehensive metabolic panel     Status: Abnormal   Collection Time: 07/19/22  2:12 PM  Result Value Ref Range   Sodium 140 135 - 145 mmol/L   Potassium 3.7 3.5 - 5.1 mmol/L   Chloride 100 98 - 111 mmol/L   CO2 28 22 - 32 mmol/L   Glucose, Bld 125 (H) 70 - 99 mg/dL   BUN 12 8 - 23 mg/dL   Creatinine, Ser 0.94 0.44 - 1.00 mg/dL   Calcium 10.6 (H) 8.9 - 10.3 mg/dL   Total Protein 8.5 (H) 6.5 - 8.1 g/dL   Albumin 4.4 3.5 - 5.0 g/dL   AST 24 15 - 41 U/L   ALT 12 0 - 44 U/L   Alkaline Phosphatase 71 38 - 126 U/L   Total Bilirubin 0.9 0.3 - 1.2 mg/dL   GFR, Estimated >60 >60 mL/min   Anion  gap 12 5 - 15  CBG monitoring, ED     Status: Abnormal   Collection Time: 07/19/22  2:21 PM  Result Value Ref Range   Glucose-Capillary 101 (H) 70 - 99 mg/dL  Troponin I (High Sensitivity)     Status: None   Collection Time: 07/19/22  4:30 PM  Result Value Ref Range   Troponin I (High Sensitivity) 5 <18 ng/L  Brain natriuretic peptide     Status: None   Collection Time: 07/19/22  4:30 PM  Result Value Ref Range   B Natriuretic Peptide 10.0 0.0 - 100.0 pg/mL  Urine Drug Screen, Qualitative     Status: None   Collection Time: 07/19/22  8:35 PM  Result Value Ref Range   Tricyclic, Ur Screen NONE DETECTED NONE DETECTED   Amphetamines, Ur Screen NONE DETECTED NONE DETECTED   MDMA (Ecstasy)Ur Screen NONE DETECTED NONE DETECTED   Cocaine Metabolite,Ur [place] NONE DETECTED NONE DETECTED   Opiate, Ur Screen NONE DETECTED NONE DETECTED   Phencyclidine (PCP) Ur S NONE DETECTED NONE DETECTED   Cannabinoid 50 Ng, Ur [place] NONE DETECTED NONE DETECTED   Barbiturates, Ur Screen NONE DETECTED NONE DETECTED   Benzodiazepine, Ur Scrn NONE DETECTED NONE DETECTED   Methadone Scn, Ur NONE DETECTED NONE DETECTED  Urinalysis, Routine w reflex microscopic Urine, Clean Catch     Status: Abnormal    Collection Time: 07/19/22  8:35 PM  Result Value Ref Range   Color, Urine YELLOW (A) YELLOW   APPearance CLEAR (A) CLEAR   Specific Gravity, Urine >1.046 (H) 1.005 - 1.030   pH 8.0 5.0 - 8.0   Glucose, UA NEGATIVE NEGATIVE mg/dL   Hgb urine dipstick NEGATIVE NEGATIVE   Bilirubin Urine NEGATIVE NEGATIVE   Ketones, ur NEGATIVE NEGATIVE mg/dL   Protein, ur NEGATIVE NEGATIVE mg/dL   Nitrite NEGATIVE NEGATIVE   Leukocytes,Ua NEGATIVE NEGATIVE  Comprehensive metabolic panel     Status: Abnormal   Collection Time: 07/20/22  5:53 AM  Result Value Ref Range   Sodium 140 135 - 145 mmol/L   Potassium 3.6 3.5 - 5.1 mmol/L   Chloride 104 98 - 111 mmol/L   CO2 28 22 - 32 mmol/L   Glucose, Bld 107 (H) 70 - 99 mg/dL   BUN 13 8 - 23 mg/dL   Creatinine, Ser 0.78 0.44 - 1.00 mg/dL   Calcium 9.7 8.9 - 10.3 mg/dL   Total Protein 7.0 6.5 - 8.1 g/dL   Albumin 3.6 3.5 - 5.0 g/dL   AST 17 15 - 41 U/L   ALT 13 0 - 44 U/L   Alkaline Phosphatase 58 38 - 126 U/L   Total Bilirubin 0.9 0.3 - 1.2 mg/dL   GFR, Estimated >60 >60 mL/min   Anion gap 8 5 - 15  CBC     Status: Abnormal   Collection Time: 07/20/22  5:53 AM  Result Value Ref Range   WBC 5.7 4.0 - 10.5 K/uL   RBC 3.91 3.87 - 5.11 MIL/uL   Hemoglobin 11.3 (L) 12.0 - 15.0 g/dL   HCT 34.8 (L) 36.0 - 46.0 %   MCV 89.0 80.0 - 100.0 fL   MCH 28.9 26.0 - 34.0 pg   MCHC 32.5 30.0 - 36.0 g/dL   RDW 13.2 11.5 - 15.5 %   Platelets 231 150 - 400 K/uL   nRBC 0.0 0.0 - 0.2 %     Impression:  66 year old female who presents with decreased interactivity.  She is awake and  tracks the examiner, confirming consciousness.  She has an inconsistent exam and actively resists at times.  MRI is negative.  I do think that without improvement, Status Epilepticus Would Need to Be Evaluated for and We Can Do This with an EEG.  If EEG Is Negative, I would consider involving psychiatry.  Catatonia would be a possibility, though the onset was relatively abrupt.  Conversion  disorder would also need to be strongly considered.  Recommendations: 1) EEG 2) treatment of seizure if discovered on EEG, otherwise would consider psychiatric consult.  Roland Rack, MD [facility] [phone]  If 7pm- 7am, please page neurology on call as listed in [place].

## 2022-07-20 NOTE — Progress Notes (Signed)
Eeg done 

## 2022-07-20 NOTE — Evaluation (Signed)
Occupational Therapy Evaluation Patient Details Name: Carolyn Campbell MRN: [id number] DOB: 1956-02-27 Today's Date: 07/20/2022   History of Present Illness Pt is a 66 y/o F admitted on 07/19/22. Pt was at home & per husband he observed pt to begin breathing fast & called EMS. Upon EMS evaluation pt was weak & unable to walk, felt dizzy, & became unresponsive on the stretcher with pt becoming almost catatonic. Brain MRI was negative. PMH: asthma, breast CA s/p L lumpectomy, COPD, Crohn's disease, depression, dysarthria, glaucoma, hypercholesterolemia, HLD, low back pain radiating to LLE, nonobstructive CAD, cerebral artery occlusion, L sided PE, stroke   Clinical Impression   Patient presenting with decreased independence in self-care, functional mobility, safety, balance, and strength/endurance. Family in room, assisting in providing PLOF. Reporting patient is independent with ADL and uses a SPC PRN when her LLE is weak. Patient currently functioning at min A for bed mobility supine>sit EOB with increased time to follow multimodal cues. Patient was able to transfer sit EOB> stand  with min A +1 hand held assist. Patient was able to take 4 side steps to [facility] with increased time and frequent multimodal cues. Patients assessment is not consistent with mobility tasks, requiring further assessment. Patient placed back in supine with min guard and able to reposition self in bed.Patient will benefit from acute OT to increase overall independence in the areas of ADLs, functional mobility, in order to safely discharge to the next venue of care.       Recommendations for follow up therapy are one component of a multi-disciplinary discharge planning process, led by the attending physician.  Recommendations may be updated based on patient status, additional functional criteria and insurance authorization.   Follow Up Recommendations  Skilled nursing-short term rehab (<3 hours/day)    Assistance Recommended at  Discharge Frequent or constant Supervision/Assistance  Patient can return home with the following A little help with walking and/or transfers;A little help with bathing/dressing/bathroom;Help with stairs or ramp for entrance;Direct supervision/assist for medications management;Direct supervision/assist for financial management;Assistance with cooking/housework;Assist for transportation    Functional Status Assessment  Patient has had a recent decline in their functional status and demonstrates the ability to make significant improvements in function in a reasonable and predictable amount of time.  Equipment Recommendations  Other (comment) (Defer to next venue of care)       Precautions / Restrictions Precautions Precautions: Fall Restrictions Weight Bearing Restrictions: No      Mobility Bed Mobility   Bed Mobility: Supine to Sit, Sit to Supine     Supine to sit: Min assist Sit to supine: Min guard   General bed mobility comments: Pt is able to scoot to [facility] with encouragement/cuing & extra time    Transfers Overall transfer level: Needs assistance Equipment used: 2 person hand held assist Transfers: Sit to/from Stand Sit to Stand: Min assist                  Balance Overall balance assessment: Needs assistance Sitting-balance support: Bilateral upper extremity supported, Feet unsupported Sitting balance-Leahy Scale: Fair     Standing balance support: Bilateral upper extremity supported, During functional activity Standing balance-Leahy Scale: Fair                             ADL either performed or assessed with clinical judgement     Pertinent Vitals/Pain Pain Assessment Pain Assessment: No/denies pain        Extremity/Trunk  Assessment Upper Extremity Assessment Upper Extremity Assessment: Difficult to assess due to impaired cognition;Generalized weakness   Lower Extremity Assessment Lower Extremity Assessment: Generalized weakness        Communication Communication Communication: Expressive difficulties   Cognition Arousal/Alertness: Awake/alert Behavior During Therapy: Flat affect Overall Cognitive Status: Difficult to assess Area of Impairment: Orientation, Attention, Memory, Following commands, Safety/judgement, Awareness, Problem solving                   Current Attention Level: Sustained Memory: Decreased short-term memory Following Commands: Follows one step commands with increased time, Follows one step commands inconsistently Safety/Judgement: Decreased awareness of safety, Decreased awareness of deficits Awareness: Anticipatory, Emergent, Intellectual Problem Solving: Slow processing, Decreased initiation, Difficulty sequencing, Requires verbal cues, Requires tactile cues                  Home Living Family/patient expects to be discharged to:: Private residence Living Arrangements: Spouse/significant other Available Help at Discharge: Family Type of Home: House Home Access: Stairs to enter [employer] of Steps: 4 Entrance Stairs-Rails: Right;Left;Can reach both Home Layout: Multi-level;Laundry or work area in basement;Able to live on main level with bedroom/bathroom     Bathroom Shower/Tub: Teacher, early years/pre: [place] - quad;Cane - single point          Prior Functioning/Environment Prior Level of Function : Independent/Modified Independent;Driving             Mobility Comments: Pt ambulates without AD except PRN use of SPC when LLE feeling weak, driving, no falls.          OT Problem List: Decreased strength;Decreased activity tolerance;Decreased safety awareness;Impaired balance (sitting and/or standing)         OT Goals(Current goals can be found in the care plan section) Acute Rehab OT Goals Patient Stated Goal: to get better OT Goal Formulation: With patient/family Time For Goal Achievement:  08/03/22 Potential to Achieve Goals: Good ADL Goals Pt Will Perform Lower Body Bathing: with modified independence Pt Will Perform Lower Body Dressing: with modified independence Pt Will Transfer to Toilet: with modified independence Pt Will Perform Toileting - Clothing Manipulation and hygiene: with supervision  OT Frequency: Min 2X/week    Co-evaluation              AM-PAC OT "6 Clicks" Daily Activity     Outcome Measure Help from another person eating meals?: None Help from another person taking care of personal grooming?: A Little Help from another person toileting, which includes using toliet, bedpan, or urinal?: A Little Help from another person bathing (including washing, rinsing, drying)?: A Little Help from another person to put on and taking off regular upper body clothing?: None Help from another person to put on and taking off regular lower body clothing?: A Little 6 Click Score: 20   End of Session Equipment Utilized During Treatment: Gait belt Nurse Communication: Mobility status  Activity Tolerance: Patient tolerated treatment well Patient left: in bed;with family/visitor present (transport staff)  OT Visit Diagnosis: Muscle weakness (generalized) (M62.81);Unsteadiness on feet (R26.81)                Time: 1856-3149 OT Time Calculation (min): 21 min Charges:  OT General Charges $OT Visit: 1 Visit OT Evaluation $OT Eval Moderate Complexity: 1 Mod OT Treatments $Therapeutic Activity: 8-22 mins    Buford Gayler, OTS 07/20/2022, 3:18 PM

## 2022-07-20 NOTE — ED Notes (Signed)
EEG at this time

## 2022-07-20 NOTE — ED Notes (Signed)
Pt provided with blanket per family request, no further needs expressed. WCTM.

## 2022-07-20 NOTE — Procedures (Signed)
History: 66 year old female being evaluated for decreased responsiveness  Sedation: No current sedation, did receive Ativan yesterday  Technique: This EEG was acquired with electrodes placed according to the International 10-20 electrode system (including Fp1, Fp2, F3, F4, C3, C4, P3, P4, O1, O2, T3, T4, T5, T6, A1, A2, Fz, Cz, Pz). The following electrodes were missing or displaced: none.   Background: The background is dominated by excessive frontally predominant beta activity.  This does give a sharply contoured appearance to the background, but no clear epileptiform discharges were seen.  There is a posterior dominant rhythm of 10 Hz which is seen at times and attenuates with eye opening.  With sleep, there are bilaterally symmetrical sleep structures observed.  No clearly epileptiform activity was seen.  Photic stimulation: Physiologic driving is not performed  EEG Abnormalities: Excessive beta activity  Clinical Interpretation: This essentially normal EEG is recorded in the waking and sleep state.  The excessive beta activity seen is likely secondary to medication effect.  There was no seizure or seizure predisposition recorded on this study. Please note that lack of epileptiform activity on EEG does not preclude the possibility of epilepsy.   Roland Rack, MD [facility] [phone]  If 7pm- 7am, please page neurology on call as listed in [place].

## 2022-07-20 NOTE — Evaluation (Addendum)
Speech Language Pathology Evaluation Patient Details Name: Carolyn Campbell MRN: [id number] DOB: 08-29-1956 Today's Date: 07/20/2022 Time: 7412-8786 SLP Time Calculation (min) (ACUTE ONLY): 35 min  Problem List:  Patient Active Problem List   Diagnosis Date Noted   Altered mental status 07/20/2022   Palpitation 07/19/2022   Pulmonary embolism ([place]) 07/16/2022   Pain in limb 11/10/2021   Nonobstructive CAD (coronary artery disease) 08/06/2021   Other long term (current) drug therapy 01/22/2021   Varicose veins of both lower extremities with pain 07/19/2018   Arthritis of right hand 12/21/2017   Immunosuppressed status ([place]) 12/21/2017   Major depression in remission ([place]) 12/21/2017   Fibrocystic breast changes 06/22/2017   Osteopenia 10/18/2016   Atherosclerosis of aorta ([place]) 12/19/2015   Vitreous degeneration 05/21/2015   Glaucoma associated with chamber angle anomalies 05/21/2015   H/O malignant neoplasm of breast 05/21/2015   Crohn's disease of both small and large intestine with rectal bleeding ([place]) 12/04/2014   Dyspnea 11/07/2012   Solitary pulmonary nodule 11/07/2012   Cognitive deficits as late effect of cerebrovascular disease 01/20/2010   CVA, old, hemiparesis ([place]) 04/28/2009   Hypothyroidism 06/12/2008   Acid reflux 05/23/2007   Benign essential HTN 01/18/2007   Past Medical History:  Past Medical History:  Diagnosis Date   Abdominal pain, epigastric    Allergic rhinitis    Arthritis    Asthma    Breast cancer ([place]) 2006   LT LUMPECTOMY   Clotting disorder ([place])    Cognitive deficits as late effect of cerebrovascular disease    COPD (chronic obstructive pulmonary disease) ([place])    COVID-19 virus infection 04/2021   Crohn's disease ([place]) 05/21/2015   FOLLOWED BY GI   Crohn's disease of both small and large intestine with rectal bleeding ([place]) 12/04/2014   Depression    Currently taking zoloft.   Dermatitis, eczematoid 05/21/2015   Dysarthria as late  effect of cerebrovascular disease    Dyspnea    Esophageal reflux    Fever blister 07/19/2018   Glaucoma    vitreous degeneration   History of echocardiogram    a. 05/2021 Echo: EF 60-65%, no rwma, nl RV fxn.   History of kidney stones    Hypercholesterolemia 01/18/2007   Hyperlipidemia    Hypertension    Hypothyroidism    Low back pain radiating to left leg 06/17/2021   Nonobstructive CAD (coronary artery disease)    a. 10/2012 Cath: diffuse minor irregs-->med rx; b. 08/2021 Cor CTA: Ca2+ = 55.6 (77th%'ile), LAD 25p, LCX <25p. No signif non-cardiac findings.   Occlusion, cerebral artery    NOS w/infarction   Osteoporosis    Overweight (BMI 25.0-29.9) 07/17/2022   Personal history of radiation therapy 2006   BREAST CA   Pulmonary embolism ([place])    a.04/2021 CTA Chest: Small filling defects are noted in upper and lower lobe branches of L PA-->acute PE-->eliquis. *PE occurred in context of COVID infxn.   Stroke [facility])    Ulcer    Past Surgical History:  Past Surgical History:  Procedure Laterality Date   BREAST BIOPSY Left 2006   POS   BREAST LUMPECTOMY Left 09/20/2004   positive   BREAST SURGERY Left    malignant biopsy   CARDIAC CATHETERIZATION     COLONOSCOPY  2015   COLONOSCOPY WITH PROPOFOL N/A 06/08/2017   Procedure: COLONOSCOPY WITH PROPOFOL;  Surgeon: Lin Landsman, MD;  Location: [place];  Service: Gastroenterology;  Laterality: N/A;   COLONOSCOPY WITH PROPOFOL N/A  03/08/2019   Procedure: COLONOSCOPY WITH PROPOFOL;  Surgeon: Lin Landsman, MD;  Location: [facility] ENDOSCOPY;  Service: Gastroenterology;  Laterality: N/A;   COLONOSCOPY WITH PROPOFOL N/A 07/16/2020   Procedure: COLONOSCOPY WITH PROPOFOL;  Surgeon: Lin Landsman, MD;  Location: [facility] ENDOSCOPY;  Service: Gastroenterology;  Laterality: N/A;   ESOPHAGOGASTRODUODENOSCOPY  2015   ESOPHAGOGASTRODUODENOSCOPY (EGD) WITH PROPOFOL N/A 06/08/2017   Procedure: ESOPHAGOGASTRODUODENOSCOPY (EGD) WITH  PROPOFOL;  Surgeon: Lin Landsman, MD;  Location: [place];  Service: Gastroenterology;  Laterality: N/A;   ESOPHAGOGASTRODUODENOSCOPY (EGD) WITH PROPOFOL N/A 03/08/2019   Procedure: ESOPHAGOGASTRODUODENOSCOPY (EGD) WITH PROPOFOL;  Surgeon: Lin Landsman, MD;  Location: [facility] ENDOSCOPY;  Service: Gastroenterology;  Laterality: N/A;   FINGER SURGERY     Right small finger   FRACTURE SURGERY     GIVENS CAPSULE STUDY  2015   TUBAL LIGATION     HPI:  Pt is a 66 y/o F admitted on 07/19/22. Pt was at home & per husband he observed pt to begin breathing fast & called EMS. Upon EMS evaluation pt was weak & unable to walk, felt dizzy, & became unresponsive on the stretcher with pt becoming almost catatonic. Brain MRI was negative. PMH: asthma, breast CA s/p L lumpectomy, COPD, Crohn's disease, depression, dysarthria, glaucoma, hypercholesterolemia, HLD, low back pain radiating to LLE, nonobstructive CAD, cerebral artery occlusion, L sided PE, stroke (2008).   Assessment / Plan / Recommendation Clinical Impression  In addition to the above history, chart review also indicates recent appt with Outpatient Neurologist Dr Jennings Books on 07/15/2022. Per report, pt with multifactorial cognitive impairment - likely due to patient's history of stroke, depression, stress and positive family history of dementia in her father. Rivastignmine 1.5 mg added for REM behavior disorder in a patient with vivid dreams and dream enactment, new Polysomnography (i-lab sleep test) followed by [place], depression with welfare check performed on 03/26/2022.         SLUMS 10/09/2019: 21/30     SLUMS 07/01/2021: 23/30           SLP arrived to pt's room shortly after PT. Pt's daughter present at onset of session with pt's husband arriving after ~ 5 minutes. Pt was sleeping but easily aroused. Pt presents with slow processing, ability to use head knods to answer basic yes/no  questions, very little verbal communication noted. Pt able to follow 1-step directions during funciton tasks such as moving up to head of bed, standing from sitted to fix her gown. Pt able to express that she was at [facility]. When returning to bed, pt demonstrated an absent gaze, absent bilateral blinking to threat throughout and no response to pain. During this time, pt's blood pressure was 163/65 mmHG MAP 93 (RUE). Attending, neurologist and nurse notified. After ~ 5 minutes, pt began moving her eyes laterally but still didn't blink to threat and continued to be unresponsive to pain. Unsure if pt experienced absent seizure but notified team of events of session and neurologist plans to order EEG.  Given pt's inability to respond, would recommend pt be NPO until she is able to demonstrate consistent awareness.     SLP Assessment  SLP Recommendation/Assessment: Patient needs continued Speech [place] [facility] SLP Visit Diagnosis: Cognitive communication deficit (R41.841)    Recommendations for follow up therapy are one component of a multi-disciplinary discharge planning process, led by the attending physician.  Recommendations may be updated based on patient status, additional functional criteria and insurance authorization.  Follow Up Recommendations  No SLP follow up    Assistance Recommended at Discharge  Frequent or constant Supervision/Assistance  Functional Status Assessment Patient has had a recent decline in their functional status and/or demonstrates limited ability to make significant improvements in function in a reasonable and predictable amount of time  Frequency and Duration min 2x/week  2 weeks      SLP Evaluation Cognition  Overall Cognitive Status: Difficult to assess Arousal/Alertness: Awake/alert Orientation Level: Oriented to person;Oriented to place;Disoriented to time;Disoriented to situation       Pauleen Goleman B. Rutherford Nail, M.S., CCC-SLP, Product manager Certified [facility] [place]  [place] Office [phone] Ascom [phone] Fax [phone]

## 2022-07-20 NOTE — Progress Notes (Signed)
PROGRESS NOTE    Carolyn Campbell  OBS:[id number] DOB: 01-Mar-1956 DOA: 07/19/2022 PCP: Delsa Grana, PA-C    Brief Narrative:   Carolyn Campbell is a 66 y.o. female with past medical history significant for history of CVA, major depressive disorder, REM sleep behavior disorder, prediabetes, multifactorial cognitive impairment, Crohn's disease, hypothyroidism, HTN, recent diagnosis of pulmonary embolism on Eliquis who presented to [facility] on 10/30 via EMS with complaints of shortness of breath.  While in route to the ED, EMS reports that patient became "catatonic" and confused with slow response.  Unclear if this was due to patient's cooperation and underlying psychiatric history versus new medication patient started on recently by neurology, rivastigmine.  But family reports this is unlike her typical behavior.  In the ED, temperature 97.5 F, HR 109, RR 21, BP 170/67, SPO2 97% on room air.  WBC 5.8, hemoglobin 12.8, platelet count 285.  Sodium 140, potassium 3.7, chloride 100, CO2 28, glucose 125, BUN 12, creatinine 0.94.  AST 24, ALT 12, total bilirubin 0.9.  BNP 10.5.  High sensitive troponin 5.  INR 1.8.  Urinalysis unrevealing.  EtOH level less than 10.  UDS negative.  CT angiogram chest PE limited exam due to respiratory motion and poor opacification of the pulmonary arteries, no evidence of PE in the main pulmonary arteries no evidence of right heart strain but overall poor exam.  MR brain without contrast with no acute intracranial abnormality, mild chronic microvascular ischemic changes.  Neurology was consulted.  TRH consulted for admission for further evaluation and treatment of confusion and shortness of breath.  Assessment & Plan:   Encephalopathy Hx REM sleep behavior disorder Hx Multifactoral cognitive impairment Follows with neurology outpatient, Dr. Manuella Ghazi.  Was recently placed on rivastigmine for sleep behavior disorder.  On presentation to the ED, EMS reported  patient was catatonic and not responding appropriately to commands.  Concern for possible cooperation versus underlying behavioral disorder versus medication side effect.  No significant findings on lab studies.  MR brain unrevealing.  Seen by neurology with recommendations of EEG. -- Discontinued home rivastigmine -- EEG: Pending -- If no significant findings on EEG, may need psychiatry involvement as no organic causes have been found such far for her underlying symptoms -- SLP for swallow evaluation given RN concerned about her ability to take pills/oral intake at this time  Pulmonary embolism Recently diagnosed, continue Eliquis  Major depressive disorder -- sertraline 50 mg p.o. daily  Hypothyroidism -- Check TSH -- Levothyroxine 50 mcg p.o. daily  Essential hypertension Home medication includes losartan-HCTZ 50-12.5 mg p.o. daily.  Blood pressure 112/55 this morning. --Hold antihypertensives for now   DVT prophylaxis:  apixaban (ELIQUIS) tablet 5 mg    Code Status: Full Code Family Communication: Updated daughter present at bedside this morning  Disposition Plan:  Level of care: Telemetry Medical Status is: Observation The patient remains OBS appropriate and will d/c before 2 midnights.    Consultants:  Neurology  Procedures:  EEG: Pending  Antimicrobials:  None   Subjective: Patient seen examined bedside, resting comfortably.  Lying in bed.  Very slow to respond but alert and oriented.  Seen by RN this morning concerned about her ability to take her oral medications or any type of oral intake and requesting speech therapy evaluation.  Patient's daughter states that she is having difficulty with listening to requested commands and then performing these which is taking her much longer than normal.  Daughter reports recently started on  a new medication, rivastigmine outpatient a few days ago by her neurologist.  Discussed that this may be a contributing factor and will  discontinue this medicine for now and await EEG.  No other specific complaints or concerns at this time.  Patient denies headache, no chest pain, no abdominal pain, no fever.  No other acute concerns overnight per nursing staff.  Objective: Vitals:   07/20/22 0630 07/20/22 0700 07/20/22 0800 07/20/22 0830  BP: (!) 112/54 (!) 112/55 (!) 140/60 (!) 119/55  Pulse: 62 65 73 (!) 59  Resp: 16 15 16 16   Temp:      TempSrc:      SpO2: 96% 96% 96% 96%  Weight:      Height:        Intake/Output Summary (Last 24 hours) at 07/20/2022 0938 Last data filed at 07/20/2022 0715 Gross per 24 hour  Intake 1050.14 ml  Output --  Net 1050.14 ml   Filed Weights   07/19/22 1413  Weight: 63.5 kg    Examination:  Physical Exam: GEN: NAD, alert and oriented x 3, elderly in appearance, slow to respond HEENT: NCAT, PERRL, EOMI, sclera clear, MMM PULM: CTAB w/o wheezes/crackles, normal respiratory effort, on room air CV: RRR w/o M/G/R GI: abd soft, NTND, NABS, no R/G/M MSK: no peripheral edema, when asked to move her extremities, patient unable to move extremities to command, noted flaccid paralysis with passive range of motion NEURO: CN II-XII intact, flaccid paralysis on passive range of motion, sensation intact to noxious stimuli; [employer] effort PSYCH: Depressed mood, flat affect Integumentary: dry/intact, no rashes or wounds    Data Reviewed: I have personally reviewed following labs and imaging studies  CBC: Recent Labs  Lab 07/16/22 1620 07/17/22 0400 07/19/22 1412 07/20/22 0553  WBC 9.9 8.0 5.8 5.7  NEUTROABS 6.1  --  3.6  --   HGB 12.2 11.5* 12.8 11.3*  HCT 38.2 36.0 38.6 34.8*  MCV 90.1 90.9 87.3 89.0  PLT 244 227 285 939   Basic Metabolic Panel: Recent Labs  Lab 07/16/22 1620 07/17/22 0400 07/19/22 1412 07/20/22 0553  NA 139 140 140 140  K 3.0* 3.6 3.7 3.6  CL 103 105 100 104  CO2 28 30 28 28   GLUCOSE 103* 110* 125* 107*  BUN 15 12 12 13   CREATININE 0.96 0.72 0.94  0.78  CALCIUM 9.8 9.3 10.6* 9.7   GFR: Estimated Creatinine Clearance: 56 mL/min (by C-G formula based on SCr of 0.78 mg/dL). Liver Function Tests: Recent Labs  Lab 07/17/22 0400 07/19/22 1412 07/20/22 0553  AST 18 24 17   ALT 14 12 13   ALKPHOS 65 71 58  BILITOT 0.8 0.9 0.9  PROT 7.2 8.5* 7.0  ALBUMIN 3.8 4.4 3.6   No results for input(s): "LIPASE", "AMYLASE" in the last 168 hours. No results for input(s): "AMMONIA" in the last 168 hours. Coagulation Profile: Recent Labs  Lab 07/16/22 1622 07/19/22 1412  INR 1.0 1.8*   Cardiac Enzymes: No results for input(s): "CKTOTAL", "CKMB", "CKMBINDEX", "TROPONINI" in the last 168 hours. BNP (last 3 results) No results for input(s): "PROBNP" in the last 8760 hours. HbA1C: No results for input(s): "HGBA1C" in the last 72 hours. CBG: Recent Labs  Lab 07/19/22 1421  GLUCAP 101*   Lipid Profile: No results for input(s): "CHOL", "HDL", "LDLCALC", "TRIG", "CHOLHDL", "LDLDIRECT" in the last 72 hours. Thyroid Function Tests: No results for input(s): "TSH", "T4TOTAL", "FREET4", "T3FREE", "THYROIDAB" in the last 72 hours. Anemia Panel: No results for input(s): "  VITAMINB12", "FOLATE", "FERRITIN", "TIBC", "IRON", "RETICCTPCT" in the last 72 hours. Sepsis Labs: No results for input(s): "PROCALCITON", "LATICACIDVEN" in the last 168 hours.  No results found for this or any previous visit (from the past 240 hour(s)).       Radiology Studies: MR BRAIN WO CONTRAST  Result Date: 07/19/2022 CLINICAL DATA:  Altered mental status. EXAM: MRI HEAD WITHOUT CONTRAST TECHNIQUE: Multiplanar, multiecho pulse sequences of the brain and surrounding structures were obtained without intravenous contrast. COMPARISON:  None Available. FINDINGS: Brain: No acute infarction, hemorrhage, hydrocephalus, extra-axial collection or mass lesion. Incidentally noted is a partially empty sella. Sequela of mild chronic microvascular ischemic change. C Vascular: Normal  flow voids. Skull and upper cervical spine: Normal marrow signal. Sinuses/Orbits: Bilateral lens replacement. Other: None IMPRESSION: 1. No acute intracranial abnormality. 2. Mild chronic microvascular ischemic change. Electronically Signed   By: Marin Roberts M.D.   On: 07/19/2022 18:22   US Venous Img Lower Bilateral  Result Date: 07/19/2022 CLINICAL DATA:  Lower extremity swelling EXAM: BILATERAL LOWER EXTREMITY VENOUS DOPPLER ULTRASOUND TECHNIQUE: Gray-scale sonography with graded compression, as well as color Doppler and duplex ultrasound were performed to evaluate the lower extremity deep venous systems from the level of the common femoral vein and including the common femoral, femoral, profunda femoral, popliteal and calf veins including the posterior tibial, peroneal and gastrocnemius veins when visible. The superficial great saphenous vein was also interrogated. Spectral Doppler was utilized to evaluate flow at rest and with distal augmentation maneuvers in the common femoral, femoral and popliteal veins. COMPARISON:  None Available. FINDINGS: RIGHT LOWER EXTREMITY VENOUS Normal compressibility of the RIGHT common femoral, superficial femoral, and popliteal veins, as well as the visualized calf veins. Visualized portions of profunda femoral vein and great saphenous vein unremarkable. No filling defects to suggest DVT on grayscale or color Doppler imaging. Doppler waveforms show normal direction of venous flow, normal respiratory plasticity and response to augmentation. OTHER No evidence of superficial thrombophlebitis or abnormal fluid collection. Limitations: none LEFT LOWER EXTREMITY VENOUS Normal compressibility of the LEFT common femoral, superficial femoral, and popliteal veins, as well as the visualized calf veins. Visualized portions of profunda femoral vein and great saphenous vein unremarkable. No filling defects to suggest DVT on grayscale or color Doppler imaging. Doppler waveforms show  normal direction of venous flow, normal respiratory plasticity and response to augmentation. OTHER No evidence of superficial thrombophlebitis or abnormal fluid collection. Limitations: none IMPRESSION: No evidence of femoropopliteal DVT or superficial thrombophlebitis within either lower extremity. Michaelle Birks, MD [facility] Radiology Electronically Signed   By: Michaelle Birks M.D.   On: 07/19/2022 17:30   CT Angio Chest Pulmonary Embolism (PE) W or WO Contrast  Result Date: 07/19/2022 CLINICAL DATA:  Shortness of breath EXAM: CT ANGIOGRAPHY CHEST WITH CONTRAST TECHNIQUE: Multidetector CT imaging of the chest was performed using the standard protocol during bolus administration of intravenous contrast. Multiplanar CT image reconstructions and MIPs were obtained to evaluate the vascular anatomy. RADIATION DOSE REDUCTION: This exam was performed according to the departmental dose-optimization program which includes automated exposure control, adjustment of the mA and/or kV according to patient size and/or use of iterative reconstruction technique. CONTRAST:  141m OMNIPAQUE IOHEXOL 350 MG/ML SOLN COMPARISON:  CTA Chest 07/16/22 FINDINGS: Cardiovascular: Assessment for the presence of PE is markedly limited due to a combination of respiratory motion artifact and poor opacification of the pulmonary arteries. Of note, the thrombus seen on recent prior CTA chest on 07/16/22  is not visualized on this exam. No evidence of pulmonary embolism in the main pulmonary arteries. Normal heart size. No pericardial effusion. Mediastinum/Nodes: No enlarged mediastinal, hilar, or axillary lymph nodes. Thyroid gland, trachea, and esophagus demonstrate no significant findings. Lungs/Pleura: No suspicious pulmonary nodules are visualized. Note that assessment is limited due to respiratory motion artifact. No pleural effusion or pneumothorax. Upper Abdomen: No acute abnormality.  Musculoskeletal: No chest wall abnormality. No acute or significant osseous findings. Small intramuscular lipoma along the anterior chest wall (series 11, image [id number]). Review of the MIP images confirms the above findings. IMPRESSION: Assessment for the presence of PE is markedly limited due to a combination of respiratory motion artifact and poor opacification of the pulmonary arteries. Of note, the thrombus seen on recent prior CTA chest on 07/16/22 is not visualized on this exam. No evidence of pulmonary embolism in the main pulmonary arteries. No CT evidence of right heart strain. Electronically Signed   By: Marin Roberts M.D.   On: 07/19/2022 15:24   CT ANGIO HEAD NECK W WO CM (CODE STROKE)  Result Date: 07/19/2022 CLINICAL DATA:  Neuro deficit, acute, stroke suspected EXAM: CT ANGIOGRAPHY HEAD AND NECK TECHNIQUE: Multidetector CT imaging of the head and neck was performed using the standard protocol during bolus administration of intravenous contrast. Multiplanar CT image reconstructions and MIPs were obtained to evaluate the vascular anatomy. Carotid stenosis measurements (when applicable) are obtained utilizing NASCET criteria, using the distal internal carotid diameter as the denominator. RADIATION DOSE REDUCTION: This exam was performed according to the departmental dose-optimization program which includes automated exposure control, adjustment of the mA and/or kV according to patient size and/or use of iterative reconstruction technique. CONTRAST:  121m OMNIPAQUE IOHEXOL 350 MG/ML SOLN COMPARISON:  CT head from the same day. FINDINGS: CTA NECK FINDINGS Aortic arch: Great vessel origins are patent without significant stenosis. Right carotid system: No evidence of dissection, stenosis (50% or greater), or occlusion. Left carotid system: No evidence of dissection, stenosis (50% or greater), or occlusion. Vertebral arteries: Codominant. No evidence of dissection, stenosis (50% or greater), or occlusion.  Skeleton: No acute findings. Other neck: No acute findings. Upper chest: Visualized lung apices are clear. Review of the MIP images confirms the above findings CTA HEAD FINDINGS Anterior circulation: Bilateral intracranial ICAs are patent with mild narrowing due to calcific atherosclerosis. Bilateral MCAs and ACAs are patent without proximal hemodynamically significantly stenosis. Posterior circulation: Bilateral intradural vertebral arteries, basilar artery and bilateral posterior cerebral arteries are patent without proximal hemodynamically significant stenosis. Venous sinuses: As permitted by contrast timing, patent. Review of the MIP images confirms the above findings IMPRESSION: No emergent large vessel occlusion or proximal hemodynamically significant stenosis. Electronically Signed   By: FMargaretha SheffieldM.D.   On: 07/19/2022 15:04   CT HEAD CODE STROKE WO CONTRAST`  Result Date: 07/19/2022 CLINICAL DATA:  Code stroke.  [place] EXAM: CT HEAD WITHOUT CONTRAST TECHNIQUE: Contiguous axial images were obtained from the base of the skull through the vertex without intravenous contrast. RADIATION DOSE REDUCTION: This exam was performed according to the departmental dose-optimization program which includes automated exposure control, adjustment of the mA and/or kV according to patient size and/or use of iterative reconstruction technique. COMPARISON:  None Available. FINDINGS: Brain: No evidence of acute infarction, hemorrhage, hydrocephalus, extra-axial collection or mass lesion/mass effect. Vascular: No hyperdense vessel or unexpected calcification. Skull: Normal. Negative for fracture or focal lesion. Sinuses/Orbits: Bilateral lens replacements. Other: None. ASPECTS ([place] Early CT Score): 10 IMPRESSION:  1. No hemorrhage or CT evidence of an acute infarct. 2. ASPECTS is 10 Discussed with Dr. Leonel Ramsay on 07/19/22 at 2:45 PM via telephone. Electronically Signed   By: Marin Roberts M.D.    On: 07/19/2022 14:51        Scheduled Meds:  [START ON 08/14/2022] apixaban  5 mg Oral BID   latanoprost  1 drop Both Eyes QHS   levothyroxine  50 mcg Oral QAC breakfast   moxifloxacin  1 drop Both Eyes TID   sodium chloride flush  3 mL Intravenous Q12H   Continuous Infusions:  lactated ringers 100 mL/hr at 07/20/22 0715     LOS: 0 days    Time spent: 52 minutes spent on chart review, discussion with nursing staff, consultants, updating family and interview/physical exam; more than 50% of that time was spent in counseling and/or coordination of care.    Wisam Siefring J [place] DO [facility] Available via Epic secure chat 7am-7pm After these hours, please refer to coverage provider listed on [email or web address] 07/20/2022, 9:38 AM

## 2022-07-20 NOTE — Evaluation (Signed)
Physical Therapy Evaluation Patient Details Name: Carolyn Campbell MRN: [id number] DOB: Oct 31, 1955 Today's Date: 07/20/2022  History of Present Illness  Pt is a 66 y/o F admitted on 07/19/22. Pt was at home & per husband he observed pt to begin breathing fast & called EMS. Upon EMS evaluation pt was weak & unable to walk, felt dizzy, & became unresponsive on the stretcher with pt becoming almost catatonic. Brain MRI was negative. PMH: asthma, breast CA s/p L lumpectomy, COPD, Crohn's disease, depression, dysarthria, glaucoma, hypercholesterolemia, HLD, low back pain radiating to LLE, nonobstructive CAD, cerebral artery occlusion, L sided PE, stroke  Clinical Impression  Pt seen for PT evaluation with SLP arriving shortly after. Pt received with daughter in room then husband arriving. Prior to admission pt was independent without AD except PRN use of SPC when LLE was "giving out", denies falls, and was driving. On this date, pt requires manual facilitation/cuing to initiate supine>sit & sitting EOB but then able to sit without BLE support or assistance from therapist with LOB. Pt able to return supine & sit<>Supine again during session with supervision & extra time. Pt able to elevate BLE onto stool while sitting EOB on command. Pt progressed to standing & taking side steps at EOB with BUE support with global weakness noted but able to complete. Pt then returns to bed & PT attempts to ask orientation questions but after ~4 questions pt begins to demonstrate absent gaze, no blinking x5 minutes, nor blink response to stimulus, or response to pain. Attending, neurologist, & nurse notified. Pt eventually begins to move eyes laterally to actively look at therapist but still does not blink in response to stimuli or response to pain, which seems odd. Unsure if pt experienced absent seizure but notified team of events of session & neurologist plans to order EEG. Will continue to follow pt acutely to address functional  mobility. DME recommendations TBD based on pt's progress.   BP in RUE 133/110 mmHg MAP 119 Rechecked at end of session 163/65 mmHg MAP 93  Pt with delayed verbal responses throughout session with encouragement to speak.   Recommendations for follow up therapy are one component of a multi-disciplinary discharge planning process, led by the attending physician.  Recommendations may be updated based on patient status, additional functional criteria and insurance authorization.  Follow Up Recommendations Home health PT      Assistance Recommended at Discharge Frequent or constant Supervision/Assistance  Patient can return home with the following  A lot of help with walking and/or transfers;A lot of help with bathing/dressing/bathroom;Assistance with cooking/housework;Assist for transportation;Help with stairs or ramp for entrance;Direct supervision/assist for medications management;Assistance with feeding    Equipment Recommendations  (TBD)  Recommendations for Other Services       Functional Status Assessment Patient has had a recent decline in their functional status and demonstrates the ability to make significant improvements in function in a reasonable and predictable amount of time.     Precautions / Restrictions Precautions Precautions: Fall Restrictions Weight Bearing Restrictions: No      Mobility  Bed Mobility Overal bed mobility: Needs Assistance Bed Mobility: Supine to Sit, Sit to Supine     Supine to sit: Supervision, HOB elevated Sit to supine: Supervision, HOB elevated   General bed mobility comments: Pt is able to scoot to [facility] with encouragement/cuing & extra time    Transfers Overall transfer level: Needs assistance Equipment used: 1 person hand held assist Transfers: Sit to/from Stand Sit to Stand: Min assist  Ambulation/Gait                  Stairs            Wheelchair Mobility    Modified Rankin (Stroke  Patients Only)       Balance Overall balance assessment: Needs assistance Sitting-balance support: Bilateral upper extremity supported, Feet unsupported Sitting balance-Leahy Scale: Fair Sitting balance - Comments: pt is able to sit EOB without BLE with BUE support with close supervision   Standing balance support: Bilateral upper extremity supported, During functional activity Standing balance-Leahy Scale: Poor                               Pertinent Vitals/Pain Pain Assessment Pain Assessment: Faces Faces Pain Scale: No hurt    Home Living Family/patient expects to be discharged to:: Private residence Living Arrangements: Spouse/significant other Available Help at Discharge: Family Type of Home: House Home Access: Stairs to enter Entrance Stairs-Rails: Right;Left;Can reach both Entrance Stairs-Number of Steps: 4   Home Layout: Multi-level;Laundry or work area in basement;Able to live on main level with bedroom/bathroom Home Equipment: Radio producer - quad      Prior Function Prior Level of Function : Independent/Modified Independent;Driving             Mobility Comments: Pt ambulates without AD except PRN use of SPC when LLE feeling weak, driving, no falls.       Hand Dominance        Extremity/Trunk Assessment   Upper Extremity Assessment Upper Extremity Assessment: Difficult to assess due to impaired cognition;Generalized weakness    Lower Extremity Assessment Lower Extremity Assessment: Generalized weakness;Difficult to assess due to impaired cognition       Communication      Cognition Arousal/Alertness: Awake/alert Behavior During Therapy: Flat affect Overall Cognitive Status: Difficult to assess Area of Impairment: Orientation, Attention, Memory, Following commands, Safety/judgement, Awareness, Problem solving                 Orientation Level:  (oriented to self/age, place "cone", and year) Current Attention Level:  Sustained Memory: Decreased short-term memory Following Commands: Follows one step commands with increased time, Follows one step commands inconsistently Safety/Judgement: Decreased awareness of safety, Decreased awareness of deficits Awareness: Anticipatory, Emergent, Intellectual Problem Solving: Slow processing, Decreased initiation, Difficulty sequencing, Requires verbal cues, Requires tactile cues          General Comments      Exercises     Assessment/Plan    PT Assessment Patient needs continued PT services  PT Problem List Decreased strength;Decreased mobility;Decreased safety awareness;Decreased range of motion;Decreased coordination;Decreased activity tolerance;Decreased balance;Decreased knowledge of use of DME;Decreased cognition;Cardiopulmonary status limiting activity;Impaired sensation       PT Treatment Interventions DME instruction;Gait training;Balance training;Therapeutic exercise;Stair training;Neuromuscular re-education;Functional mobility training;Cognitive remediation;Therapeutic activities;Patient/family education;Manual techniques;Modalities    PT Goals (Current goals can be found in the Care Plan section)  Acute Rehab PT Goals Patient Stated Goal: figure out what's going on, get better PT Goal Formulation: With family Time For Goal Achievement: 08/03/22 Potential to Achieve Goals: Fair    Frequency Min 2X/week     Co-evaluation PT/OT/SLP Co-Evaluation/Treatment: Yes Reason for Co-Treatment: For patient/therapist safety;Necessary to address cognition/behavior during functional activity PT goals addressed during session: Mobility/safety with mobility;Balance         AM-PAC PT "6 Clicks" Mobility  Outcome Measure Help needed turning from your back to your side while in a flat  bed without using bedrails?: None Help needed moving from lying on your back to sitting on the side of a flat bed without using bedrails?: A Little Help needed moving to and  from a bed to a chair (including a wheelchair)?: A Little Help needed standing up from a chair using your arms (e.g., wheelchair or bedside chair)?: A Little Help needed to walk in hospital room?: A Lot Help needed climbing 3-5 steps with a railing? : A Lot 6 Click Score: 17    End of Session   Activity Tolerance: Treatment limited secondary to medical complications (Comment) Patient left: in bed;with call bell/phone within reach;with family/visitor present Nurse Communication: Mobility status (notified attending MD, neurologist, and nurse of events of session) PT Visit Diagnosis: Unsteadiness on feet (R26.81);Muscle weakness (generalized) (M62.81);Difficulty in walking, not elsewhere classified (R26.2)    Time: 6015-6153 PT Time Calculation (min) (ACUTE ONLY): 35 min   Charges:   PT Evaluation $PT Eval High Complexity: 1 High          Lavone Nian, PT, DPT 07/20/22, 10:36 AM   Waunita Schooner 07/20/2022, 10:30 AM

## 2022-07-21 DIAGNOSIS — R0600 Dyspnea, unspecified: Secondary | ICD-10-CM

## 2022-07-21 DIAGNOSIS — R4182 Altered mental status, unspecified: Secondary | ICD-10-CM | POA: Diagnosis not present

## 2022-07-21 MED ORDER — APIXABAN 5 MG PO TABS
10.0000 mg | ORAL_TABLET | Freq: Two times a day (BID) | ORAL | Status: DC
  Administered 2022-07-21 – 2022-07-22 (×4): 10 mg via ORAL
  Filled 2022-07-21 (×4): qty 2

## 2022-07-21 MED ORDER — APIXABAN 5 MG PO TABS: 5.0000 mg | ORAL_TABLET | Freq: Two times a day (BID) | ORAL | Status: DC

## 2022-07-21 NOTE — Progress Notes (Signed)
Brief narrative: 66 year old female with a history of anxiety, recently diagnosed with memory disorder and started on rivastigmine as well as recent diagnosis of pulmonary embolism who presented on 10/30.  Initially, she was complaining of shortness of breath at home, and due to her distress with her shortness of breath 911 was called.  There was no concern for neurological symptoms prior to EMS arrival.  It was with EMS in transport that she developed decreased responsiveness.  A code stroke was activated, and she was evaluated in the emergency department and seemed to have a significantly inconsistent exam.  Due to her persistent symptoms, MRI and EEG have been obtained which are both negative.  Subjective: After her EEG yesterday, I had a discussion with her husband with the patient in the room about how I felt this was a stress reaction, and tried to give the patient permission to get better stating that I anticipated that she would continue to improve.  This was borne out, and she was up and walking around, and ate some dinner.  This morning, she was alert and oriented x3, however as she was having her vitals checked, she became less responsive again and a code stroke was activated.  Exam: Vitals:   07/21/22 0553 07/21/22 0847  BP: (!) 145/71 (!) 147/67  Pulse: 90 92  Resp: 18 16  Temp: 98 F (36.7 C) 98 F (36.7 C)  SpO2: 100% 97%   Gen: In bed, NAD Resp: non-labored breathing, no acute distress Abd: soft, nt  Neuro: MS: She fixates and tracks across midline in both directions.  She does not follow commands. CN: Extraocular movements intact, blinks to threat bilaterally Motor: When her arm is lifted, she allows it to fall flaccidly to the bed, however when held above her face, she intentionally moves it away from hitting her face repeatedly.   Impression:  66 year old female who presents with decreased interactivity.  She has an inconsistent exam and has had normal imaging and EEG.   My strong suspicion at this point is for conversion disorder.  Catatonia would be another consideration, but given the waxing and waning nature, I think that this is less likely.  I have very low suspicion for organic etiology such as seizure.  If further evaluation were needed, the only other option that I think would be at all beneficial would be continuous LTM EEG, but I feel this is so low yield given her exam consistent with nonorganic etiology, that I do not think this needs to be pursued at this time.  Recommendations: 1) recommend psychiatric consultation. 2) neurology will continue to follow  Roland Rack, MD [facility] [phone]  If 7pm- 7am, please page neurology on call as listed in [place].

## 2022-07-21 NOTE — Progress Notes (Signed)
8676 Code stroke overhead paged. 0858 Code  Stroke cancel

## 2022-07-21 NOTE — Progress Notes (Signed)
Physical Therapy Treatment Patient Details Name: Carolyn Campbell MRN: [id number] DOB: 04/06/1956 Today's Date: 07/21/2022   History of Present Illness Pt is a 66 y/o F admitted on 07/19/22. Pt was at home & per husband he observed pt to begin breathing fast & called EMS. Upon EMS evaluation pt was weak & unable to walk, felt dizzy, & became unresponsive on the stretcher with pt becoming almost catatonic. Brain MRI was negative. PMH: asthma, breast CA s/p L lumpectomy, COPD, Crohn's disease, depression, dysarthria, glaucoma, hypercholesterolemia, HLD, low back pain radiating to LLE, nonobstructive CAD, cerebral artery occlusion, L sided PE, stroke    PT Comments    Pt with rapid response earlier for possible code stroke. Pt initially catatonic after, however at time of session pt alert, following commands and responsive. Pt still has difficulty speaking, however seated in bed and able to self feed scrambled eggs. Able to perform OOB mobility and ambulate short distance to chair. Reports R LE pain and chronic weakness. Seated in chair at end of session to finish breakfast. Will continue to progress.   Recommendations for follow up therapy are one component of a multi-disciplinary discharge planning process, led by the attending physician.  Recommendations may be updated based on patient status, additional functional criteria and insurance authorization.  Follow Up Recommendations  Home health PT     Assistance Recommended at Discharge Frequent or constant Supervision/Assistance  Patient can return home with the following A lot of help with walking and/or transfers;A lot of help with bathing/dressing/bathroom;Assistance with cooking/housework;Assist for transportation;Help with stairs or ramp for entrance;Direct supervision/assist for medications management;Assistance with feeding   Equipment Recommendations   (TBD)    Recommendations for Other Services       Precautions / Restrictions  Precautions Precautions: Fall Restrictions Weight Bearing Restrictions: No     Mobility  Bed Mobility Overal bed mobility: Needs Assistance Bed Mobility: Supine to Sit     Supine to sit: Min assist     General bed mobility comments: follows commands and only needs assist for L LE due to pain. ONce seated at EOB, upright posture noted. Uses all extremities equally although does present with R LE weakness-chronic per family    Transfers Overall transfer level: Needs assistance Equipment used: Rolling walker (2 wheels) Transfers: Sit to/from Stand Sit to Stand: Min assist           General transfer comment: safe technique with upright posture. Off loading of L LE due to pain    Ambulation/Gait Ambulation/Gait assistance: Min guard Gait Distance (Feet): 5 Feet Assistive device: Rolling walker (2 wheels) Gait Pattern/deviations: Step-to pattern       General Gait Details: ambulated with cga and RW. Follows commands well. Further distance declined secondary to pt wishing to continue to eat meal up in chair   Stairs             Wheelchair Mobility    Modified Rankin (Stroke Patients Only)       Balance Overall balance assessment: Needs assistance Sitting-balance support: Bilateral upper extremity supported, Feet unsupported Sitting balance-Leahy Scale: Fair Sitting balance - Comments: pt is able to sit EOB without BLE with BUE support with close supervision   Standing balance support: Bilateral upper extremity supported, During functional activity Standing balance-Leahy Scale: Fair                              Cognition Arousal/Alertness: Awake/alert Behavior During Therapy:  Flat affect Overall Cognitive Status: Within Functional Limits for tasks assessed                                 General Comments: pt with difficulty with expressions and verbalizing, however is receptive and understands/responds to cues         Exercises Other Exercises Other Exercises: pt noted to be soiled in bed as purewick leaked. Reached out to RN staff to bed change while pt in recliner    General Comments        Pertinent Vitals/Pain Pain Assessment Pain Assessment: 0-10 Pain Score: 6  Pain Location: L LE- calf Pain Descriptors / Indicators: Aching Pain Intervention(s): Limited activity within patient's tolerance, Repositioned    Home Living                          Prior Function            PT Goals (current goals can now be found in the care plan section) Acute Rehab PT Goals Patient Stated Goal: get better PT Goal Formulation: With family Time For Goal Achievement: 08/03/22 Potential to Achieve Goals: Fair Progress towards PT goals: Progressing toward goals    Frequency    Min 2X/week      PT Plan Current plan remains appropriate    Co-evaluation              AM-PAC PT "6 Clicks" Mobility   Outcome Measure  Help needed turning from your back to your side while in a flat bed without using bedrails?: None Help needed moving from lying on your back to sitting on the side of a flat bed without using bedrails?: A Little Help needed moving to and from a bed to a chair (including a wheelchair)?: A Little Help needed standing up from a chair using your arms (e.g., wheelchair or bedside chair)?: A Little Help needed to walk in hospital room?: A Lot Help needed climbing 3-5 steps with a railing? : A Lot 6 Click Score: 17    End of Session Equipment Utilized During Treatment: Gait belt Activity Tolerance: Patient tolerated treatment well Patient left: in chair;with family/visitor present;with call bell/phone within reach Nurse Communication: Mobility status PT Visit Diagnosis: Unsteadiness on feet (R26.81);Muscle weakness (generalized) (M62.81);Difficulty in walking, not elsewhere classified (R26.2)     Time: 7253-6644 PT Time Calculation (min) (ACUTE ONLY): 24 min  Charges:   $Gait Training: 8-22 mins $Therapeutic Activity: 8-22 mins                     Greggory Stallion, PT, DPT, GCS [phone]    Carolyn Campbell 07/21/2022, 1:28 PM

## 2022-07-21 NOTE — Code Documentation (Signed)
Stroke Response Nurse Documentation Code Documentation  STORMEE DUDA is a 66 y.o. female with past medical hx of  HTN, HLD, hypothyroidism, clotting disorder, CAD, previous stroke/CVA, PE on Eliquis (apixaban) daily on admission. Patient admitted to hospital 10/30.   Per primary RN, pt this morning was A/Ox3, moving extremities weak, but equally. Pt began not responding or following commands at 0853 while having vital signs taken by staff member. Attending provider was notified by nursing staff and a code stroke was called. Neurology at the bedside upon stroke response nurse arrival to room and was informed that the code stroke had been cancelled due to no stroke suspected, per MD.   Care Plan: code stroke cancelled by MD. Primary nurse Gerald Stabs at bedside.    Charise Carwin  Stroke Response RN

## 2022-07-21 NOTE — Consult Note (Signed)
[facility] Face-to-Face Psychiatry Consult   Reason for Consult:  R/O Conversion Disorder Referring Physician:    Cristela Felt, MD    Patient Identification: Carolyn Campbell MRN:  [id number] Principal Diagnosis: Dyspnea Diagnosis:  Principal Problem:   Dyspnea Active Problems:   Crohn's disease of both small and large intestine with rectal bleeding (HCC)   Hypothyroidism   Glaucoma associated with chamber angle anomalies   Acid reflux   Cognitive deficits as late effect of cerebrovascular disease   Benign essential HTN   Major depression in remission ([place])   Nonobstructive CAD (coronary artery disease)   Pulmonary embolism ([place])   Palpitation   Altered mental status   Total Time spent with patient: 15 minutes  Subjective:   Carolyn Campbell is a 66 y.o. female patient admitted with mental status changes.  HPI: Carolyn Campbell is a 66 year old African-American female who was admitted on 07/19/2022 for altered mental status.  She was diagnosed with a pulmonary embolism on 1027 and was discharged home.  She was seen by outpatient neurology on 07/15/2022 for REM sleep disorder and placed on Exelon.  Her son is present in the room.  He says that she would interact and speak normally and then all of a sudden she would have trouble finding her words which started on Friday.  The patient tells me that she can hear everything with cannot respond and has delayed speech.  She currently has delayed speech.  Although, she is answering questions appropriately.  MRI of her head looks to be unremarkable.  She had been seeing Dr. Modesta Messing outpatient for depression and is on Zoloft.  She denies being depressed or suicidal.  She says that she took Exelon 1 time and that is when she started having this problem feeling frozen in her body.  She is no longer on Exelon in the hospital.  Impression: This does not seem psychiatrically related.  Typically in conversion disorder you will find a traumatic event proceeds it.  This  appears to be more neurologic in nature.  I do not know if it has anything to do with her recent PE or the new medication or everything in general.  Past Psychiatric History: As above  Risk to Self:   Risk to Others:   Prior Inpatient Therapy:   Prior Outpatient Therapy:    Past Medical History:  Past Medical History:  Diagnosis Date   Abdominal pain, epigastric    Allergic rhinitis    Arthritis    Asthma    Breast cancer ([place]) 2006   LT LUMPECTOMY   Clotting disorder ([place])    Cognitive deficits as late effect of cerebrovascular disease    COPD (chronic obstructive pulmonary disease) ([place])    COVID-19 virus infection 04/2021   Crohn's disease ([place]) 05/21/2015   FOLLOWED BY GI   Crohn's disease of both small and large intestine with rectal bleeding ([place]) 12/04/2014   Depression    Currently taking zoloft.   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 05/2021 Echo: EF 60-65%, no rwma, nl RV fxn.   History of kidney stones    Hypercholesterolemia 01/18/2007   Hyperlipidemia    Hypertension    Hypothyroidism    Low back pain radiating to left leg 06/17/2021   Nonobstructive CAD (coronary  artery disease)    a. 10/2012 Cath: diffuse minor irregs-->med rx; b. 08/2021 Cor CTA: Ca2+ = 55.6 (77th%'ile), LAD 25p, LCX <25p. No signif non-cardiac findings.   Occlusion, cerebral artery    NOS w/infarction   Osteoporosis    Overweight (BMI 25.0-29.9) 07/17/2022   Personal history of radiation therapy 2006   BREAST CA   Pulmonary embolism ([place])    a.04/2021 CTA Chest: Small filling defects are noted in upper and lower lobe branches of L PA-->acute PE-->eliquis. *PE occurred in context of COVID infxn.   Stroke [facility])    Ulcer     Past Surgical History:  Procedure Laterality Date   BREAST BIOPSY Left 2006    POS   BREAST LUMPECTOMY Left 09/20/2004   positive   BREAST SURGERY Left    malignant biopsy   CARDIAC CATHETERIZATION     COLONOSCOPY  2015   COLONOSCOPY WITH PROPOFOL N/A 06/08/2017   Procedure: COLONOSCOPY WITH PROPOFOL;  Surgeon: Lin Landsman, MD;  Location: [place];  Service: Gastroenterology;  Laterality: N/A;   COLONOSCOPY WITH PROPOFOL N/A 03/08/2019   Procedure: COLONOSCOPY WITH PROPOFOL;  Surgeon: Lin Landsman, MD;  Location: [facility] ENDOSCOPY;  Service: Gastroenterology;  Laterality: N/A;   COLONOSCOPY WITH PROPOFOL N/A 07/16/2020   Procedure: COLONOSCOPY WITH PROPOFOL;  Surgeon: Lin Landsman, MD;  Location: [facility] ENDOSCOPY;  Service: Gastroenterology;  Laterality: N/A;   ESOPHAGOGASTRODUODENOSCOPY  2015   ESOPHAGOGASTRODUODENOSCOPY (EGD) WITH PROPOFOL N/A 06/08/2017   Procedure: ESOPHAGOGASTRODUODENOSCOPY (EGD) WITH PROPOFOL;  Surgeon: Lin Landsman, MD;  Location: [place];  Service: Gastroenterology;  Laterality: N/A;   ESOPHAGOGASTRODUODENOSCOPY (EGD) WITH PROPOFOL N/A 03/08/2019   Procedure: ESOPHAGOGASTRODUODENOSCOPY (EGD) WITH PROPOFOL;  Surgeon: Lin Landsman, MD;  Location: [facility] ENDOSCOPY;  Service: Gastroenterology;  Laterality: N/A;   FINGER SURGERY     Right small finger   FRACTURE SURGERY     GIVENS CAPSULE STUDY  2015   TUBAL LIGATION     Family History:  Family History  Problem Relation Age of Onset   Heart disease Mother    Heart attack Mother    Cerebrovascular Accident Father    Arthritis Father    Hypertension Father    Breast cancer Sister    Breast cancer Sister 68   Cancer Sister    Hypertension Other    Diabetes Other    Heart attack Other 67   Family Psychiatric  History: Unremarkable Social History:  Social History   Substance and Sexual Activity  Alcohol Use No   Alcohol/week: 0.0 standard drinks of alcohol     Social History   Substance and Sexual Activity  Drug Use No    Social  History   Socioeconomic History   Marital status: Married    Spouse name: Proofreader   Number of children: 2   Years of education: Not on file   Highest education level: Some college, no degree  Occupational History   Not on file  Tobacco Use   Smoking status: Never   Smokeless tobacco: Never  Vaping Use   Vaping Use: Never used  Substance and Sexual Activity   Alcohol use: No    Alcohol/week: 0.0 standard drinks of alcohol   Drug use: No   Sexual activity: Yes    Partners: Male    Birth control/protection: None  Other Topics Concern   Not on file  Social History Narrative   Married. One son and one daughter. She is disabled after she had breast  cancer and strokes. 2 caffeinated beverages daily.   Social Determinants of Health   Financial Resource Strain: Low Risk  (11/06/2019)   Overall Financial Resource Strain (CARDIA)    Difficulty of Paying Living Expenses: Not hard at all  Food Insecurity: No Food Insecurity (07/20/2022)   Hunger Vital Sign    Worried About Running Out of Food in the Last Year: Never true    Ran Out of Food in the Last Year: Never true  Transportation Needs: No Transportation Needs (07/20/2022)   PRAPARE - Hydrologist (Medical): No    Lack of Transportation (Non-Medical): No  Physical Activity: Insufficiently Active (11/06/2019)   Exercise Vital Sign    Days of Exercise per Week: 2 days    Minutes of Exercise per Session: 30 min  Stress: No Stress Concern Present (11/06/2019)   [place]    Feeling of Stress : Not at all  Social Connections: Moderately Integrated (11/06/2019)   Social Connection and Isolation Panel [NHANES]    Frequency of Communication with Friends and Family: More than three times a week    Frequency of Social Gatherings with Friends and Family: Three times a week    Attends Religious Services: More than 4 times per year    Active Member  of Clubs or Organizations: No    Attends Archivist Meetings: Never    Marital Status: Married   Additional Social History:    Allergies:  No Known Allergies  Labs:  Results for orders placed or performed during the hospital encounter of 07/19/22 (from the past 48 hour(s))  Troponin I (High Sensitivity)     Status: None   Collection Time: 07/19/22  4:30 PM  Result Value Ref Range   Troponin I (High Sensitivity) 5 <18 ng/L    Comment: (NOTE) Elevated high sensitivity troponin I (hsTnI) values and significant  changes across serial measurements may suggest ACS but many other  chronic and acute conditions are known to elevate hsTnI results.  Refer to the "Links" section for chest pain algorithms and additional  guidance. Performed at [facility], [address] [place], [place] [address]   Brain natriuretic peptide     Status: None   Collection Time: 07/19/22  4:30 PM  Result Value Ref Range   B Natriuretic Peptide 10.0 0.0 - 100.0 pg/mL    Comment: Performed at [facility], [address] [place], [place] [address]  Urine Drug Screen, Qualitative     Status: None   Collection Time: 07/19/22  8:35 PM  Result Value Ref Range   Tricyclic, Ur Screen NONE DETECTED NONE DETECTED   Amphetamines, Ur Screen NONE DETECTED NONE DETECTED   MDMA (Ecstasy)Ur Screen NONE DETECTED NONE DETECTED   Cocaine Metabolite,Ur [place] NONE DETECTED NONE DETECTED   Opiate, Ur Screen NONE DETECTED NONE DETECTED   Phencyclidine (PCP) Ur S NONE DETECTED NONE DETECTED   Cannabinoid 50 Ng, Ur [place] NONE DETECTED NONE DETECTED   Barbiturates, Ur Screen NONE DETECTED NONE DETECTED   Benzodiazepine, Ur Scrn NONE DETECTED NONE DETECTED   Methadone Scn, Ur NONE DETECTED NONE DETECTED    Comment: (NOTE) Tricyclics + metabolites, urine    Cutoff 1000 ng/mL Amphetamines + metabolites, urine  Cutoff 1000 ng/mL MDMA (Ecstasy), urine              Cutoff 500 ng/mL Cocaine Metabolite, urine  Cutoff 300 ng/mL Opiate + metabolites, urine        Cutoff 300 ng/mL Phencyclidine (PCP), urine         Cutoff 25 ng/mL Cannabinoid, urine                 Cutoff 50 ng/mL Barbiturates + metabolites, urine  Cutoff 200 ng/mL Benzodiazepine, urine              Cutoff 200 ng/mL Methadone, urine                   Cutoff 300 ng/mL  The urine drug screen provides only a preliminary, unconfirmed analytical test result and should not be used for non-medical purposes. Clinical consideration and professional judgment should be applied to any positive drug screen result due to possible interfering substances. A more specific alternate chemical method must be used in order to obtain a confirmed analytical result. Gas chromatography / mass spectrometry (GC/MS) is the preferred confirm atory method. Performed at [facility], [address] [place], [place] [address]   Urinalysis, Routine w reflex microscopic Urine, Clean Catch     Status: Abnormal   Collection Time: 07/19/22  8:35 PM  Result Value Ref Range   Color, Urine YELLOW (A) YELLOW   APPearance CLEAR (A) CLEAR   Specific Gravity, Urine >1.046 (H) 1.005 - 1.030   pH 8.0 5.0 - 8.0   Glucose, UA NEGATIVE NEGATIVE mg/dL   Hgb urine dipstick NEGATIVE NEGATIVE   Bilirubin Urine NEGATIVE NEGATIVE   Ketones, ur NEGATIVE NEGATIVE mg/dL   Protein, ur NEGATIVE NEGATIVE mg/dL   Nitrite NEGATIVE NEGATIVE   Leukocytes,Ua NEGATIVE NEGATIVE    Comment: Performed at [facility], [address] [place], [place] [address]  Comprehensive metabolic panel     Status: Abnormal   Collection Time: 07/20/22  5:53 AM  Result Value Ref Range   Sodium 140 135 - 145 mmol/L   Potassium 3.6 3.5 - 5.1 mmol/L   Chloride 104 98 - 111 mmol/L   CO2 28 22 - 32 mmol/L   Glucose, Bld 107 (H) 70 - 99 mg/dL    Comment: Glucose reference range applies only to samples taken after fasting for at least 8 hours.   BUN 13 8 - 23 mg/dL    Creatinine, Ser 0.78 0.44 - 1.00 mg/dL   Calcium 9.7 8.9 - 10.3 mg/dL   Total Protein 7.0 6.5 - 8.1 g/dL   Albumin 3.6 3.5 - 5.0 g/dL   AST 17 15 - 41 U/L   ALT 13 0 - 44 U/L   Alkaline Phosphatase 58 38 - 126 U/L   Total Bilirubin 0.9 0.3 - 1.2 mg/dL   GFR, Estimated >60 >60 mL/min    Comment: (NOTE) Calculated using the CKD-EPI Creatinine Equation (2021)    Anion gap 8 5 - 15    Comment: Performed at [facility], [address], [place] [address]  CBC     Status: Abnormal   Collection Time: 07/20/22  5:53 AM  Result Value Ref Range   WBC 5.7 4.0 - 10.5 K/uL   RBC 3.91 3.87 - 5.11 MIL/uL   Hemoglobin 11.3 (L) 12.0 - 15.0 g/dL   HCT 34.8 (L) 36.0 - 46.0 %   MCV 89.0 80.0 - 100.0 fL   MCH 28.9 26.0 - 34.0 pg   MCHC 32.5 30.0 - 36.0 g/dL   RDW 13.2 11.5 - 15.5 %   Platelets 231 150 - 400  K/uL   nRBC 0.0 0.0 - 0.2 %    Comment: Performed at [facility] [address] [place], [place] [address]    Current Facility-Administered Medications  Medication Dose Route Frequency Provider Last Rate Last Admin   acetaminophen (TYLENOL) tablet 650 mg  650 mg Oral Q6H PRN Para Skeans, MD   650 mg at 07/20/22 2034   Or   acetaminophen (TYLENOL) suppository 650 mg  650 mg Rectal Q6H PRN Para Skeans, MD       apixaban Arne [place]) tablet 10 mg  10 mg Oral BID Dibia, Manfred Shirts, MD   10 mg at 07/21/22 1133   Followed by   Derrill Memo ON 07/26/2022] apixaban (ELIQUIS) tablet 5 mg  5 mg Oral BID Dibia, Manfred Shirts, MD       HYDROcodone-acetaminophen (NORCO/VICODIN) 5-325 MG per tablet 1 tablet  1 tablet Oral Q4H PRN Para Skeans, MD   1 tablet at 07/21/22 1029   latanoprost (XALATAN) 0.005 % ophthalmic solution 1 drop  1 drop Both Eyes QHS Para Skeans, MD   1 drop at 07/20/22 2044   levothyroxine (SYNTHROID) tablet 50 mcg  50 mcg Oral QAC breakfast Para Skeans, MD   50 mcg at 07/21/22 0602   morphine (PF) 2 MG/ML injection 2 mg  2 mg Intravenous Q4H PRN Para Skeans,  MD       moxifloxacin (VIGAMOX) 0.5 % ophthalmic solution 1 drop  1 drop Both Eyes TID Para Skeans, MD   1 drop at 07/21/22 0835   rosuvastatin (CRESTOR) tablet 40 mg  40 mg Oral Daily [place] Eric J, DO   40 mg at 07/21/22 1133   sertraline (ZOLOFT) tablet 50 mg  50 mg Oral Daily [place] Eric J, DO   50 mg at 07/21/22 1133   sodium chloride flush (NS) 0.9 % injection 3 mL  3 mL Intravenous Q12H Para Skeans, MD   3 mL at 07/21/22 1134    Musculoskeletal: Strength & Muscle Tone: within normal limits Gait & Station: normal Patient leans: N/A            Psychiatric Specialty Exam:  Presentation  General Appearance: No data recorded Eye Contact:No data recorded Speech:No data recorded Speech Volume:No data recorded Handedness:No data recorded  Mood and Affect  Mood:No data recorded Affect:No data recorded  Thought Process  Thought Processes:No data recorded Descriptions of Associations:No data recorded Orientation:No data recorded Thought Content:No data recorded History of Schizophrenia/Schizoaffective disorder:No data recorded Duration of Psychotic Symptoms:No data recorded Hallucinations:No data recorded Ideas of Reference:No data recorded Suicidal Thoughts:No data recorded Homicidal Thoughts:No data recorded  Sensorium  Memory:No data recorded Judgment:No data recorded Insight:No data recorded  Executive Functions  Concentration:No data recorded Attention Span:No data recorded Recall:No data recorded Fund of Knowledge:No data recorded Language:No data recorded  Psychomotor Activity  Psychomotor Activity:No data recorded  Assets  Assets:No data recorded  Sleep  Sleep:No data recorded  Physical Exam: She is alert and oriented x 3.  She is pleasant and cooperative.  She answers questions appropriately, but definitely delayed speech.  Mood and affect are euthymic.  Thought process is goal directed.  Thought content: She denies suicidal ideation,  homicidal ideation, auditory or visual hallucinations.  Her judgment and insight are good.  Blood pressure 135/60, pulse 79, temperature 98.3 F (36.8 C), resp. rate 16, height 5' (1.524 m), weight 64.5 kg, SpO2 95 %. Body mass index is 27.77 kg/m.  Treatment Plan Summary:  Plan I would recommend a neurology consult.  Disposition: No evidence of imminent risk to self or others at present.    [place], DO 07/21/2022 2:52 PM

## 2022-07-21 NOTE — Progress Notes (Signed)
Progress Note   Patient: Carolyn Campbell ALP:[id number] DOB: Jun 16, 1956 DOA: 07/19/2022     1 DOS: the patient was seen and examined on 07/21/2022   Brief hospital course: SHAMARI TROSTEL is a 66 y.o. female with past medical history significant for history of CVA, major depressive disorder, REM sleep behavior disorder, prediabetes, multifactorial cognitive impairment, Crohn's disease, hypothyroidism, HTN, recent diagnosis of pulmonary embolism on Eliquis who presented to [facility] on 10/30 via EMS with complaints of shortness of breath.  While in route to the ED, EMS reports that patient became "catatonic" and confused with slow response.  Unclear if this was due to patient's cooperation and underlying psychiatric history versus new medication patient started on recently by neurology, rivastigmine.  But family reports this is unlike her typical behavior.   In the ED, temperature 97.5 F, HR 109, RR 21, BP 170/67, SPO2 97% on room air.  WBC 5.8, hemoglobin 12.8, platelet count 285.  Sodium 140, potassium 3.7, chloride 100, CO2 28, glucose 125, BUN 12, creatinine 0.94.  AST 24, ALT 12, total bilirubin 0.9.  BNP 10.5.  High sensitive troponin 5.  INR 1.8.  Urinalysis unrevealing.  EtOH level less than 10.  UDS negative.  CT angiogram chest PE limited exam due to respiratory motion and poor opacification of the pulmonary arteries, no evidence of PE in the main pulmonary arteries no evidence of right heart strain but overall poor exam.  MR brain without contrast with no acute intracranial abnormality, mild chronic microvascular ischemic changes.  Neurology was consulted.  TRH consulted for admission for further evaluation and treatment of confusion and shortness of breath.  Assessment and Plan: Encephalopathy Hx REM sleep behavior disorder Hx Multifactoral cognitive impairment Follows with neurology outpatient, Dr. Manuella Ghazi.  Was recently placed on rivastigmine for sleep behavior disorder.   On presentation to the ED, EMS reported patient was catatonic and not responding appropriately to commands.  Concern for possible cooperation versus underlying behavioral disorder versus medication side effect.  No significant findings on lab studies.   --MR brain unrevealing.  Seen by neurology -- Discontinued home rivastigmine -- EEG: Negative for seizure activity --  Consulted psychiatry as no organic causes have been found such far for her underlying symptoms   Pulmonary embolism Recently diagnosed, continue Eliquis   Major depressive disorder -- sertraline 50 mg p.o. daily   Hypothyroidism -- Check TSH -- Levothyroxine 50 mcg p.o. daily   Essential hypertension Home medication includes losartan-HCTZ 50-12.5 mg p.o. daily.  Blood pressure 112/55 this morning. --Hold antihypertensives for now     DVT prophylaxis: Eliquis      Subjective: Seen at bedside this morning, I was notified that patient had decreased responsiveness compared to earlier this morning, at bed side patient had a fixed gaze and was not responsive to voice, sternal rub. I called a code stroke. Husband at bedside stated that patient has had similar episode at ER during this hospitalization. Neurology evaluated patient at bedside, he had a low suspicion for ongoing seizures, he recommended psych consult for evaluation for conversion disorder /catatonia. Her mental status improved afterwards and she was able to track.  Code stroke cancelled  Physical Exam: GEN: NAD, alert and oriented x 3, elderly in appearance, slow to respond HEENT: NCAT, PERRL, EOMI, sclera clear, MMM PULM: CTAB w/o wheezes/crackles, normal respiratory effort, on room air CV: RRR w/o M/G/R GI: abd soft, NTND, NABS, no R/G/M MSK: no peripheral edema, when asked to move her extremities,  patient unable to move extremities to command, noted flaccid paralysis with passive range of motion NEURO: CN II-XII intact, flaccid paralysis on passive range  of motion, sensation intact to noxious stimuli; [place] effort PSYCH: Depressed mood, flat affect Integumentary: dry/intact, no rashes or wounds   Vitals:   07/21/22 0500 07/21/22 0553 07/21/22 0847 07/21/22 0853  BP:  (!) 145/71 (!) 147/67 (!) 147/67  Pulse:  90 92 92  Resp:  18 16 16   Temp:  98 F (36.7 C) 98 F (36.7 C) 98 F (36.7 C)  TempSrc:  Oral    SpO2:  100% 97% 97%  Weight: 64.5 kg     Height:        Data Reviewed:  There are no new results to review at this time.  Family Communication: Husband at bedside  Disposition: Status is: Observation   Planned Discharge Destination: Home    Time spent: 15 minutes  Author: Cristela Felt, MD 07/21/2022 10:53 AM  For on call review [email or web address].

## 2022-07-21 NOTE — Progress Notes (Signed)
   07/21/22 0853  Assess: MEWS Score  Temp 98 F (36.7 C)  BP (!) 147/67  MAP (mmHg) 91  Pulse Rate 92  Resp 16  Level of Consciousness Unresponsive  SpO2 97 %  Assess: if the MEWS score is Yellow or Red  Were vital signs taken at a resting state? Yes  Focused Assessment Change from prior assessment (see assessment flowsheet)  Does the patient meet 2 or more of the SIRS criteria? No  MEWS guidelines implemented *See Row Information* Yes  Treat  MEWS Interventions Other (Comment) (MD contacted and code stroke called)  Pain Scale 0-10  Pain Score 0 (patient not responding to painful stimulus)  Faces Pain Scale 0  Breathing 0  Negative Vocalization 0  Facial Expression 0  Body Language 0  Consolability 0  PAINAD Score 0  Take Vital Signs  Increase Vital Sign Frequency  Yellow: Q 2hr X 2 then Q 4hr X 2, if remains yellow, continue Q 4hrs  Escalate  MEWS: Escalate Yellow: discuss with charge nurse/RN and consider discussing with provider and RRT  Notify: Charge Nurse/RN  Name of Charge Nurse/RN Notified Gwendel Hanson  Date Charge Nurse/RN Notified 07/21/22  Time Charge Nurse/RN Notified 1017  Notify: Provider  Provider Name/Title Dr Williams Che  Date Provider Notified 07/21/22  Time Provider Notified [phone]  Method of Notification Face-to-face (sent text and MD came to room)  Notification Reason Other (Comment) (MD and nuerologist assessed patient see new order)  Provider response At bedside  Date of Provider Response 07/21/22  Time of Provider Response [phone]  Notify: Rapid Response  Name of Rapid Response RN Notified  (code stroke canceled by MD)

## 2022-07-21 NOTE — Plan of Care (Signed)
Pt c/o of headache. Had failed yale swallow earlier when she was less alert and oriented.  She is more A&O.  Performed 2nd yale swallow and pt passed and took oral tylenol w/out issue.  Reached out to on call NP for diet order and she prefers pt see Swallow and Speech therapist to order diet.

## 2022-07-21 NOTE — Evaluation (Signed)
Clinical/Bedside Swallow Evaluation Patient Details  Name: Carolyn Campbell MRN: [id number] Date of Birth: October 25, 1955  Today's Date: 07/21/2022 Time: SLP Start Time (ACUTE ONLY): 6387 SLP Stop Time (ACUTE ONLY): 5643 SLP Time Calculation (min) (ACUTE ONLY): 13 min  Past Medical History:  Past Medical History:  Diagnosis Date   Abdominal pain, epigastric    Allergic rhinitis    Arthritis    Asthma    Breast cancer ([place]) 2006   LT LUMPECTOMY   Clotting disorder ([place])    Cognitive deficits as late effect of cerebrovascular disease    COPD (chronic obstructive pulmonary disease) ([place])    COVID-19 virus infection 04/2021   Crohn's disease ([place]) 05/21/2015   FOLLOWED BY GI   Crohn's disease of both small and large intestine with rectal bleeding ([place]) 12/04/2014   Depression    Currently taking zoloft.   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 05/2021 Echo: EF 60-65%, no rwma, nl RV fxn.   History of kidney stones    Hypercholesterolemia 01/18/2007   Hyperlipidemia    Hypertension    Hypothyroidism    Low back pain radiating to left leg 06/17/2021   Nonobstructive CAD (coronary artery disease)    a. 10/2012 Cath: diffuse minor irregs-->med rx; b. 08/2021 Cor CTA: Ca2+ = 55.6 (77th%'ile), LAD 25p, LCX <25p. No signif non-cardiac findings.   Occlusion, cerebral artery    NOS w/infarction   Osteoporosis    Overweight (BMI 25.0-29.9) 07/17/2022   Personal history of radiation therapy 2006   BREAST CA   Pulmonary embolism ([place])    a.04/2021 CTA Chest: Small filling defects are noted in upper and lower lobe branches of L PA-->acute PE-->eliquis. *PE occurred in context of COVID infxn.   Stroke [facility])    Ulcer    Past Surgical History:  Past Surgical History:  Procedure Laterality Date   BREAST BIOPSY Left  2006   POS   BREAST LUMPECTOMY Left 09/20/2004   positive   BREAST SURGERY Left    malignant biopsy   CARDIAC CATHETERIZATION     COLONOSCOPY  2015   COLONOSCOPY WITH PROPOFOL N/A 06/08/2017   Procedure: COLONOSCOPY WITH PROPOFOL;  Surgeon: Lin Landsman, MD;  Location: [place];  Service: Gastroenterology;  Laterality: N/A;   COLONOSCOPY WITH PROPOFOL N/A 03/08/2019   Procedure: COLONOSCOPY WITH PROPOFOL;  Surgeon: Lin Landsman, MD;  Location[facility] ENDOSCOPY;  Service: Gastroenterology;  Laterality: N/A;   COLONOSCOPY WITH PROPOFOL N/A 07/16/2020   Procedure: COLONOSCOPY WITH PROPOFOL;  Surgeon: Lin Landsman, MD;  Location: [facility] ENDOSCOPY;  Service: Gastroenterology;  Laterality: N/A;   ESOPHAGOGASTRODUODENOSCOPY  2015   ESOPHAGOGASTRODUODENOSCOPY (EGD) WITH PROPOFOL N/A 06/08/2017   Procedure: ESOPHAGOGASTRODUODENOSCOPY (EGD) WITH PROPOFOL;  Surgeon: Lin Landsman, MD;  Location: [place];  Service: Gastroenterology;  Laterality: N/A;   ESOPHAGOGASTRODUODENOSCOPY (EGD) WITH PROPOFOL N/A 03/08/2019   Procedure: ESOPHAGOGASTRODUODENOSCOPY (EGD) WITH PROPOFOL;  Surgeon: Lin Landsman, MD;  Location: [facility] ENDOSCOPY;  Service: Gastroenterology;  Laterality: N/A;   FINGER SURGERY     Right small finger   FRACTURE SURGERY     GIVENS CAPSULE STUDY  2015   TUBAL LIGATION     HPI:  Pt is a 66 y/o F admitted on 07/19/22. Pt was at home &  per husband he observed pt to begin breathing fast & called EMS. Upon EMS evaluation pt was weak & unable to walk, felt dizzy, & became unresponsive on the stretcher with pt becoming almost catatonic. Brain MRI was negative. PMH: asthma, breast CA s/p L lumpectomy, COPD, Crohn's disease, depression, dysarthria, glaucoma, hypercholesterolemia, HLD, low back pain radiating to LLE, nonobstructive CAD, cerebral artery occlusion, L sided PE, stroke (2008).    Assessment / Plan / Recommendation  Clinical Impression  At  time of this evaluation, pt was catatonic with recent code stroke called and cancelled d/t catatonic state. Pt's husband present. Per chart, pt passed [place] yesterday and per pt's husband, she consumed graham crackers with him last night without any overt s/s of aspiration. Her husband requests that pt be allowed to "eat a soft diet" when she is alert and awake. SLP further spoke with pt's nurse and attending (Dr Williams Che) regarding request and functional oropharyngeal abilities on the [place]. At this time, recommend a dysphagia 2 diet with thin liquids via straw, medicine whole in puree. At this time, ST services are no longer indicated. SLP Visit Diagnosis: Dysphagia, unspecified (R13.10);Cognitive communication deficit (R41.841)    Aspiration Risk   (increased risk d/t intermittent catatonic state)    Diet Recommendation Dysphagia 2 (Fine chop);Thin liquid   Liquid Administration via: Straw Medication Administration: Whole meds with puree Supervision: Full supervision/cueing for compensatory strategies;Staff to assist with self feeding Compensations: Minimize environmental distractions;Slow rate;Small sips/bites Postural Changes: Remain upright for at least 30 minutes after po intake;Seated upright at 90 degrees    Other  Recommendations Oral Care Recommendations: Oral care BID Other Recommendations: Have oral suction available    Recommendations for follow up therapy are one component of a multi-disciplinary discharge planning process, led by the attending physician.  Recommendations may be updated based on patient status, additional functional criteria and insurance authorization.  Follow up Recommendations No SLP follow up      Assistance Recommended at Discharge Frequent or constant Supervision/Assistance    Swallow Study   General Date of Onset: 07/19/22 HPI: Pt is a 66 y/o F admitted on 07/19/22. Pt was at home & per husband he observed pt to begin  breathing fast & called EMS. Upon EMS evaluation pt was weak & unable to walk, felt dizzy, & became unresponsive on the stretcher with pt becoming almost catatonic. Brain MRI was negative. PMH: asthma, breast CA s/p L lumpectomy, COPD, Crohn's disease, depression, dysarthria, glaucoma, hypercholesterolemia, HLD, low back pain radiating to LLE, nonobstructive CAD, cerebral artery occlusion, L sided PE, stroke (2008). Type of Study: Bedside Swallow Evaluation Previous Swallow Assessment: MBSS x 2; functional abilities Diet Prior to this Study: NPO Temperature Spikes Noted: No Respiratory Status: Room air History of Recent Intubation: No Behavior/Cognition:  (catatonic) Oral Cavity Assessment: Within Functional Limits             Nicklos Gaxiola B. Rutherford Nail, M.S., CCC-SLP, Mining engineer Certified Brain Injury [place]  [place] Office [phone] Ascom [phone] Fax [phone]

## 2022-07-22 ENCOUNTER — Encounter (HOSPITAL_COMMUNITY): Payer: Self-pay

## 2022-07-22 ENCOUNTER — Inpatient Hospital Stay (HOSPITAL_COMMUNITY)
Admission: RE | Admit: 2022-07-22 | Discharge: 2022-07-24 | DRG: 071 | Disposition: A | Discharge status: Home Health | Payer: Medicare Other | Source: Other Acute Inpatient Hospital | Attending: Internal Medicine | Admitting: Internal Medicine

## 2022-07-22 DIAGNOSIS — Z853 Personal history of malignant neoplasm of breast: Secondary | ICD-10-CM | POA: Diagnosis not present

## 2022-07-22 DIAGNOSIS — I10 Essential (primary) hypertension: Secondary | ICD-10-CM | POA: Diagnosis present

## 2022-07-22 DIAGNOSIS — R569 Unspecified convulsions: Secondary | ICD-10-CM | POA: Diagnosis present

## 2022-07-22 DIAGNOSIS — Z823 Family history of stroke: Secondary | ICD-10-CM | POA: Diagnosis not present

## 2022-07-22 DIAGNOSIS — M5430 Sciatica, unspecified side: Secondary | ICD-10-CM | POA: Diagnosis present

## 2022-07-22 DIAGNOSIS — E039 Hypothyroidism, unspecified: Secondary | ICD-10-CM | POA: Diagnosis present

## 2022-07-22 DIAGNOSIS — I2699 Other pulmonary embolism without acute cor pulmonale: Secondary | ICD-10-CM | POA: Diagnosis present

## 2022-07-22 DIAGNOSIS — G9340 Encephalopathy, unspecified: Secondary | ICD-10-CM | POA: Diagnosis not present

## 2022-07-22 DIAGNOSIS — F419 Anxiety disorder, unspecified: Secondary | ICD-10-CM | POA: Diagnosis present

## 2022-07-22 DIAGNOSIS — Y92239 Unspecified place in hospital as the place of occurrence of the external cause: Secondary | ICD-10-CM | POA: Diagnosis present

## 2022-07-22 DIAGNOSIS — Z8673 Personal history of transient ischemic attack (TIA), and cerebral infarction without residual deficits: Secondary | ICD-10-CM | POA: Diagnosis not present

## 2022-07-22 DIAGNOSIS — E7800 Pure hypercholesterolemia, unspecified: Secondary | ICD-10-CM | POA: Diagnosis present

## 2022-07-22 DIAGNOSIS — E782 Mixed hyperlipidemia: Secondary | ICD-10-CM

## 2022-07-22 DIAGNOSIS — Z833 Family history of diabetes mellitus: Secondary | ICD-10-CM

## 2022-07-22 DIAGNOSIS — Z86711 Personal history of pulmonary embolism: Secondary | ICD-10-CM

## 2022-07-22 DIAGNOSIS — Z87442 Personal history of urinary calculi: Secondary | ICD-10-CM

## 2022-07-22 DIAGNOSIS — M810 Age-related osteoporosis without current pathological fracture: Secondary | ICD-10-CM | POA: Diagnosis present

## 2022-07-22 DIAGNOSIS — Z8249 Family history of ischemic heart disease and other diseases of the circulatory system: Secondary | ICD-10-CM | POA: Diagnosis not present

## 2022-07-22 DIAGNOSIS — G9341 Metabolic encephalopathy: Secondary | ICD-10-CM | POA: Diagnosis not present

## 2022-07-22 DIAGNOSIS — K5080 Crohn's disease of both small and large intestine without complications: Secondary | ICD-10-CM | POA: Diagnosis present

## 2022-07-22 DIAGNOSIS — G4752 REM sleep behavior disorder: Secondary | ICD-10-CM | POA: Diagnosis present

## 2022-07-22 DIAGNOSIS — J4489 Other specified chronic obstructive pulmonary disease: Secondary | ICD-10-CM | POA: Diagnosis present

## 2022-07-22 DIAGNOSIS — Z8616 Personal history of COVID-19: Secondary | ICD-10-CM | POA: Diagnosis not present

## 2022-07-22 DIAGNOSIS — I2510 Atherosclerotic heart disease of native coronary artery without angina pectoris: Secondary | ICD-10-CM | POA: Diagnosis present

## 2022-07-22 DIAGNOSIS — F32A Depression, unspecified: Secondary | ICD-10-CM | POA: Diagnosis present

## 2022-07-22 DIAGNOSIS — R0600 Dyspnea, unspecified: Secondary | ICD-10-CM | POA: Diagnosis not present

## 2022-07-22 DIAGNOSIS — Z803 Family history of malignant neoplasm of breast: Secondary | ICD-10-CM | POA: Diagnosis not present

## 2022-07-22 DIAGNOSIS — Z923 Personal history of irradiation: Secondary | ICD-10-CM | POA: Diagnosis not present

## 2022-07-22 DIAGNOSIS — E785 Hyperlipidemia, unspecified: Secondary | ICD-10-CM | POA: Diagnosis present

## 2022-07-22 DIAGNOSIS — F061 Catatonic disorder due to known physiological condition: Secondary | ICD-10-CM | POA: Diagnosis present

## 2022-07-22 DIAGNOSIS — R4182 Altered mental status, unspecified: Secondary | ICD-10-CM | POA: Diagnosis not present

## 2022-07-22 DIAGNOSIS — Z8261 Family history of arthritis: Secondary | ICD-10-CM

## 2022-07-22 DIAGNOSIS — T441X5A Adverse effect of other parasympathomimetics [cholinergics], initial encounter: Secondary | ICD-10-CM | POA: Diagnosis present

## 2022-07-22 DIAGNOSIS — Z79899 Other long term (current) drug therapy: Secondary | ICD-10-CM

## 2022-07-22 DIAGNOSIS — R7303 Prediabetes: Secondary | ICD-10-CM | POA: Diagnosis present

## 2022-07-22 DIAGNOSIS — F449 Dissociative and conversion disorder, unspecified: Secondary | ICD-10-CM | POA: Diagnosis present

## 2022-07-22 HISTORY — DX: Metabolic encephalopathy: G93.41

## 2022-07-22 MED ORDER — ACETAMINOPHEN 325 MG PO TABS
650.0000 mg | ORAL_TABLET | Freq: Four times a day (QID) | ORAL | Status: DC | PRN
  Administered 2022-07-22: 650 mg via ORAL
  Filled 2022-07-22: qty 2

## 2022-07-22 MED ORDER — LOSARTAN POTASSIUM-HCTZ 50-12.5 MG PO TABS: 1.0000 | ORAL_TABLET | Freq: Every day | ORAL | Status: DC

## 2022-07-22 MED ORDER — HYDROCHLOROTHIAZIDE 12.5 MG PO TABS
12.5000 mg | ORAL_TABLET | Freq: Every day | ORAL | Status: DC
  Administered 2022-07-22: 12.5 mg via ORAL
  Filled 2022-07-22: qty 1

## 2022-07-22 MED ORDER — ACETAMINOPHEN 650 MG RE SUPP: 650.0000 mg | Freq: Four times a day (QID) | RECTAL | Status: DC | PRN

## 2022-07-22 MED ORDER — LOSARTAN POTASSIUM 50 MG PO TABS
50.0000 mg | ORAL_TABLET | Freq: Every day | ORAL | Status: DC
  Administered 2022-07-22: 50 mg via ORAL
  Filled 2022-07-22: qty 1

## 2022-07-22 NOTE — Progress Notes (Signed)
SLP Cancellation Note  Patient Details Name: Carolyn Campbell MRN: [id number] DOB: 07-17-1956   Cancelled treatment:       Reason Eval/Treat Not Completed: Other (comment) (Evaluaiton already provided during initiat admission)   Timotheus Salm 07/22/2022, 2:03 PM

## 2022-07-22 NOTE — Discharge Summary (Signed)
Physician Discharge Summary  Carolyn Campbell:[id number] DOB: 1956-07-04 DOA: 07/19/2022  PCP: Carolyn Grana, PA-C  Admit date: 07/19/2022 Discharge date: 07/22/2022  Admitted From: Home Disposition:  Transfer to [facility] for LTM EEG   Brief/Interim Summary: Carolyn Campbell is a 66 y.o. female with past medical history significant for history of CVA, major depressive disorder, REM sleep behavior disorder, prediabetes, multifactorial cognitive impairment, Crohn's disease, hypothyroidism, HTN, recent diagnosis of pulmonary embolism on Eliquis who presented to [facility] on 10/30 via EMS with complaints of shortness of breath.  While in route to the ED, EMS reports that patient became "catatonic" and confused with slow response.  Unclear if this was due to patient's cooperation and underlying psychiatric history versus new medication patient started on recently by neurology, rivastigmine.  But family reports this is unlike her typical behavior.   In the ED, temperature 97.5 F, HR 109, RR 21, BP 170/67, SPO2 97% on room air.  WBC 5.8, hemoglobin 12.8, platelet count 285.  Sodium 140, potassium 3.7, chloride 100, CO2 28, glucose 125, BUN 12, creatinine 0.94.  AST 24, ALT 12, total bilirubin 0.9.  BNP 10.5.  High sensitive troponin 5.  INR 1.8.  Urinalysis unrevealing.  EtOH level less than 10.  UDS negative.  CT angiogram chest PE limited exam due to respiratory motion and poor opacification of the pulmonary arteries, no evidence of PE in the main pulmonary arteries no evidence of right heart strain but overall poor exam.  MR brain without contrast with no acute intracranial abnormality, mild chronic microvascular ischemic changes.  Neurology was consulted.  TRH consulted for admission for further evaluation and treatment of confusion and shortness of breath.  Psychiatry consulted to evaluate for conversion disorder.   Patient had additional catatonic episodes on 11/1 as well  as 11/2 which were self-limited.  Neurology recommended transfer to [facility] for LTM EEG for completion of work-up.  Discharge Diagnoses:   Principal Problem:   Acute metabolic encephalopathy Active Problems:   Pulmonary embolism (HCC)   Cognitive deficits as late effect of cerebrovascular disease   Crohn's disease of both small and large intestine with rectal bleeding (HCC)   Benign essential HTN   Major depression in remission ([place])   Nonobstructive CAD (coronary artery disease)   Glaucoma associated with chamber angle anomalies   Hypothyroidism   Acute metabolic encephalopathy  Hx REM sleep behavior disorder Hx multifactorial cognitive impairment -Follows with neurology outpatient, Dr. Manuella Campbell.  Was recently placed on rivastigmine for sleep behavior disorder.  On presentation to the ED, EMS reported patient was catatonic and not responding appropriately to commands. Also having slowed speech response -UA, UDS negative  -Neurology and psychiatry consulted -Stop rivastigmine -MRI brain, EEG unremarkable -Transfer to [facility] for LTM EEG   PE -Eliquis   Sciatica -Follow up with Dr. Alba Destine for SI joint injection   Depression -Zoloft   Hypothyroidism -Synthroid   HTN -Losartan-HCTZ  Discharge Instructions   Allergies as of 07/22/2022   No Known Allergies      Medication List     STOP taking these medications    rivastigmine 1.5 MG capsule Commonly known as: EXELON       TAKE these medications    Eliquis DVT/PE Starter Pack Generic drug: Apixaban Starter Pack (644m and 573m Take as directed on package: start with two-44m39mablets twice daily for 7 days. On day 8, switch to one-44mg34mblet twice daily.   apixaban  5 MG Tabs tablet Commonly known as: Eliquis Take 1 tablet (5 mg total) by mouth 2 (two) times daily. Start taking on: August 14, 2022   latanoprost 0.005 % ophthalmic solution Commonly known as: XALATAN Place 1 drop  into both eyes at bedtime.   levothyroxine 50 MCG tablet Commonly known as: SYNTHROID Take 1 tablet (50 mcg total) by mouth daily before breakfast. On empty stomach   losartan-hydrochlorothiazide 50-12.5 MG tablet Commonly known as: HYZAAR Take 1 tablet by mouth daily.   moxifloxacin 0.5 % ophthalmic solution Commonly known as: VIGAMOX Apply to eye.   multivitamin tablet Take 1 tablet by mouth daily.   neomycin-polymyxin b-dexamethasone 3.5-10000-0.1 Oint Commonly known as: MAXITROL SMARTSIG:sparingly In Eye(s) Twice Daily   rosuvastatin 40 MG tablet Commonly known as: CRESTOR Take 1 tablet (40 mg total) by mouth daily.   sertraline 50 MG tablet Commonly known as: ZOLOFT Take 1 tablet (50 mg total) by mouth daily.   triamcinolone ointment 0.1 % Commonly known as: KENALOG Apply to affected skin one to two times daily for up to two weeks What changed:  how much to take when to take this reasons to take this   valACYclovir 1000 MG tablet Commonly known as: VALTREX Take 1 tablet (1,000 mg total) by mouth 2 (two) times daily. What changed:  when to take this reasons to take this               Durable Medical Equipment  (From admission, onward)           Start     Ordered   07/22/22 1111  For home use only DME Walker  Once       Question:  Patient needs a walker to treat with the following condition  Answer:  Weakness   07/22/22 1110            No Known Allergies  Consultations: Neurology Psychiatry    Procedures/Studies: EEG adult  Result Date: August 07, 2022 Greta Doom, MD     08/07/2022  2:15 PM History: 66 year old female being evaluated for decreased responsiveness Sedation: No current sedation, did receive Ativan yesterday Technique: This EEG was acquired with electrodes placed according to the International 10-20 electrode system (including Fp1, Fp2, F3, F4, C3, C4, P3, P4, O1, O2, T3, T4, T5, T6, A1, A2, Fz, Cz, Pz). The  following electrodes were missing or displaced: none. Background: The background is dominated by excessive frontally predominant beta activity.  This does give a sharply contoured appearance to the background, but no clear epileptiform discharges were seen.  There is a posterior dominant rhythm of 10 Hz which is seen at times and attenuates with eye opening.  With sleep, there are bilaterally symmetrical sleep structures observed.  No clearly epileptiform activity was seen. Photic stimulation: Physiologic driving is not performed EEG Abnormalities: Excessive beta activity Clinical Interpretation: This essentially normal EEG is recorded in the waking and sleep state.  The excessive beta activity seen is likely secondary to medication effect.  There was no seizure or seizure predisposition recorded on this study. Please note that lack of epileptiform activity on EEG does not preclude the possibility of epilepsy. Roland Rack, MD [facility] [phone] If 7pm- 7am, please page neurology on call as listed in [place].   MR BRAIN WO CONTRAST  Result Date: 07/19/2022 CLINICAL DATA:  Altered mental status. EXAM: MRI HEAD WITHOUT CONTRAST TECHNIQUE: Multiplanar, multiecho pulse sequences of the brain and surrounding structures were obtained without intravenous contrast. COMPARISON:  None Available. FINDINGS: Brain: No acute infarction, hemorrhage, hydrocephalus, extra-axial collection or mass lesion. Incidentally noted is a partially empty sella. Sequela of mild chronic microvascular ischemic change. C Vascular: Normal flow voids. Skull and upper cervical spine: Normal marrow signal. Sinuses/Orbits: Bilateral lens replacement. Other: None IMPRESSION: 1. No acute intracranial abnormality. 2. Mild chronic microvascular ischemic change. Electronically Signed   By: Marin Roberts M.D.   On: 07/19/2022 18:22   US Venous Img Lower Bilateral  Result Date: 07/19/2022 CLINICAL DATA:  Lower extremity  swelling EXAM: BILATERAL LOWER EXTREMITY VENOUS DOPPLER ULTRASOUND TECHNIQUE: Gray-scale sonography with graded compression, as well as color Doppler and duplex ultrasound were performed to evaluate the lower extremity deep venous systems from the level of the common femoral vein and including the common femoral, femoral, profunda femoral, popliteal and calf veins including the posterior tibial, peroneal and gastrocnemius veins when visible. The superficial great saphenous vein was also interrogated. Spectral Doppler was utilized to evaluate flow at rest and with distal augmentation maneuvers in the common femoral, femoral and popliteal veins. COMPARISON:  None Available. FINDINGS: RIGHT LOWER EXTREMITY VENOUS Normal compressibility of the RIGHT common femoral, superficial femoral, and popliteal veins, as well as the visualized calf veins. Visualized portions of profunda femoral vein and great saphenous vein unremarkable. No filling defects to suggest DVT on grayscale or color Doppler imaging. Doppler waveforms show normal direction of venous flow, normal respiratory plasticity and response to augmentation. OTHER No evidence of superficial thrombophlebitis or abnormal fluid collection. Limitations: none LEFT LOWER EXTREMITY VENOUS Normal compressibility of the LEFT common femoral, superficial femoral, and popliteal veins, as well as the visualized calf veins. Visualized portions of profunda femoral vein and great saphenous vein unremarkable. No filling defects to suggest DVT on grayscale or color Doppler imaging. Doppler waveforms show normal direction of venous flow, normal respiratory plasticity and response to augmentation. OTHER No evidence of superficial thrombophlebitis or abnormal fluid collection. Limitations: none IMPRESSION: No evidence of femoropopliteal DVT or superficial thrombophlebitis within either lower extremity. Michaelle Birks, MD [facility]  Electronically Signed   By: Michaelle Birks M.D.   On: 07/19/2022 17:30   CT Angio Chest Pulmonary Embolism (PE) W or WO Contrast  Result Date: 07/19/2022 CLINICAL DATA:  Shortness of breath EXAM: CT ANGIOGRAPHY CHEST WITH CONTRAST TECHNIQUE: Multidetector CT imaging of the chest was performed using the standard protocol during bolus administration of intravenous contrast. Multiplanar CT image reconstructions and MIPs were obtained to evaluate the vascular anatomy. RADIATION DOSE REDUCTION: This exam was performed according to the departmental dose-optimization program which includes automated exposure control, adjustment of the mA and/or kV according to patient size and/or use of iterative reconstruction technique. CONTRAST:  178m OMNIPAQUE IOHEXOL 350 MG/ML SOLN COMPARISON:  CTA Chest 07/16/22 FINDINGS: Cardiovascular: Assessment for the presence of PE is markedly limited due to a combination of respiratory motion artifact and poor opacification of the pulmonary arteries. Of note, the thrombus seen on recent prior CTA chest on 07/16/22 is not visualized on this exam. No evidence of pulmonary embolism in the main pulmonary arteries. Normal heart size. No pericardial effusion. Mediastinum/Nodes: No enlarged mediastinal, hilar, or axillary lymph nodes. Thyroid gland, trachea, and esophagus demonstrate no significant findings. Lungs/Pleura: No suspicious pulmonary nodules are visualized. Note that assessment is limited due to respiratory motion artifact. No pleural effusion or pneumothorax. Upper Abdomen: No acute abnormality. Musculoskeletal: No chest wall abnormality. No acute or significant osseous findings. Small intramuscular lipoma along the anterior  chest wall (series 11, image 799). Review of the MIP images confirms the above findings. IMPRESSION: Assessment for the presence of PE is markedly limited due to a combination of respiratory motion artifact and poor opacification of the pulmonary arteries. Of  note, the thrombus seen on recent prior CTA chest on 07/16/22 is not visualized on this exam. No evidence of pulmonary embolism in the main pulmonary arteries. No CT evidence of right heart strain. Electronically Signed   By: Marin Roberts M.D.   On: 07/19/2022 15:24   CT ANGIO HEAD NECK W WO CM (CODE STROKE)  Result Date: 07/19/2022 CLINICAL DATA:  Neuro deficit, acute, stroke suspected EXAM: CT ANGIOGRAPHY HEAD AND NECK TECHNIQUE: Multidetector CT imaging of the head and neck was performed using the standard protocol during bolus administration of intravenous contrast. Multiplanar CT image reconstructions and MIPs were obtained to evaluate the vascular anatomy. Carotid stenosis measurements (when applicable) are obtained utilizing NASCET criteria, using the distal internal carotid diameter as the denominator. RADIATION DOSE REDUCTION: This exam was performed according to the departmental dose-optimization program which includes automated exposure control, adjustment of the mA and/or kV according to patient size and/or use of iterative reconstruction technique. CONTRAST:  162m OMNIPAQUE IOHEXOL 350 MG/ML SOLN COMPARISON:  CT head from the same day. FINDINGS: CTA NECK FINDINGS Aortic arch: Great vessel origins are patent without significant stenosis. Right carotid system: No evidence of dissection, stenosis (50% or greater), or occlusion. Left carotid system: No evidence of dissection, stenosis (50% or greater), or occlusion. Vertebral arteries: Codominant. No evidence of dissection, stenosis (50% or greater), or occlusion. Skeleton: No acute findings. Other neck: No acute findings. Upper chest: Visualized lung apices are clear. Review of the MIP images confirms the above findings CTA HEAD FINDINGS Anterior circulation: Bilateral intracranial ICAs are patent with mild narrowing due to calcific atherosclerosis. Bilateral MCAs and ACAs are patent without proximal hemodynamically significantly stenosis. Posterior  circulation: Bilateral intradural vertebral arteries, basilar artery and bilateral posterior cerebral arteries are patent without proximal hemodynamically significant stenosis. Venous sinuses: As permitted by contrast timing, patent. Review of the MIP images confirms the above findings IMPRESSION: No emergent large vessel occlusion or proximal hemodynamically significant stenosis. Electronically Signed   By: FMargaretha SheffieldM.D.   On: 07/19/2022 15:04   CT HEAD CODE STROKE WO CONTRAST`  Result Date: 07/19/2022 CLINICAL DATA:  Code stroke.  [place] EXAM: CT HEAD WITHOUT CONTRAST TECHNIQUE: Contiguous axial images were obtained from the base of the skull through the vertex without intravenous contrast. RADIATION DOSE REDUCTION: This exam was performed according to the departmental dose-optimization program which includes automated exposure control, adjustment of the mA and/or kV according to patient size and/or use of iterative reconstruction technique. COMPARISON:  None Available. FINDINGS: Brain: No evidence of acute infarction, hemorrhage, hydrocephalus, extra-axial collection or mass lesion/mass effect. Vascular: No hyperdense vessel or unexpected calcification. Skull: Normal. Negative for fracture or focal lesion. Sinuses/Orbits: Bilateral lens replacements. Other: None. ASPECTS ([place]roke Program Early CT Score): 10 IMPRESSION: 1. No hemorrhage or CT evidence of an acute infarct. 2. ASPECTS is 10 Discussed with Dr. KLeonel Ramsayon 07/19/22 at 2:45 PM via telephone. Electronically Signed   By: HMarin RobertsM.D.   On: 07/19/2022 14:51   CT Angio Chest PE W and/or Wo Contrast  Result Date: 07/16/2022 CLINICAL DATA:  Chest pain. EXAM: CT ANGIOGRAPHY CHEST WITH CONTRAST TECHNIQUE: Multidetector CT imaging of the chest was performed using the standard protocol during bolus administration of intravenous contrast. Multiplanar  CT image reconstructions and MIPs were obtained to evaluate the vascular  anatomy. RADIATION DOSE REDUCTION: This exam was performed according to the departmental dose-optimization program which includes automated exposure control, adjustment of the mA and/or kV according to patient size and/or use of iterative reconstruction technique. CONTRAST:  53m OMNIPAQUE IOHEXOL 350 MG/ML SOLN COMPARISON:  August 20, 2021 FINDINGS: Cardiovascular: There is moderate to marked severity calcification of the thoracic aorta, without evidence of aortic aneurysm. A small amount of intraluminal low attenuation is seen within a lower lobe branch of the right pulmonary artery (axial CT image 41, CT series 4). Normal heart size. Mild right heart strain is seen (RV/LV ratio of 1.04). No pericardial effusion. Mediastinum/Nodes: No enlarged mediastinal, hilar, or axillary lymph nodes. Thyroid gland, trachea, and esophagus demonstrate no significant findings. Lungs/Pleura: Mild atelectasis is seen within the posterior aspect of the bilateral lower lobes. There is no evidence of acute infiltrate, pleural effusion or pneumothorax. Upper Abdomen: No acute abnormality. Musculoskeletal: No chest wall abnormality. No acute or significant osseous findings. Review of the MIP images confirms the above findings. IMPRESSION: 1. Small amount of pulmonary embolism within a lower lobe branch of the right pulmonary artery. 2. Mild right heart strain (RV/LV ratio of 1.04). 3. Mild bilateral lower lobe atelectasis. 4. Aortic atherosclerosis. Aortic Atherosclerosis (ICD10-I70.0). Electronically Signed   By: TVirgina NorfolkM.D.   On: 07/16/2022 21:16   DG Chest 2 View  Result Date: 07/16/2022 CLINICAL DATA:  Chest pain EXAM: CHEST - 2 VIEW COMPARISON:  Chest x-ray 07/01/2021 FINDINGS: The heart size and mediastinal contours are within normal limits. Both lungs are clear. The visualized skeletal structures are unremarkable. IMPRESSION: No active cardiopulmonary disease. Electronically Signed   By: ARonney AstersM.D.   On:  07/16/2022 16:42   MM 3D SCREEN BREAST BILATERAL  Result Date: 06/25/2022 CLINICAL DATA:  Screening. EXAM: DIGITAL SCREENING BILATERAL MAMMOGRAM WITH TOMOSYNTHESIS AND CAD TECHNIQUE: Bilateral screening digital craniocaudal and mediolateral oblique mammograms were obtained. Bilateral screening digital breast tomosynthesis was performed. The images were evaluated with computer-aided detection. COMPARISON:  Previous exam(s). ACR Breast Density Category c: The breast tissue is heterogeneously dense, which may obscure small masses. FINDINGS: There are no findings suspicious for malignancy. IMPRESSION: No mammographic evidence of malignancy. A result letter of this screening mammogram will be mailed directly to the patient. RECOMMENDATION: Screening mammogram in one year. (Code:SM-B-01Y) BI-RADS CATEGORY  1: Negative. Electronically Signed   By: TZerita BoersM.D.   On: 06/25/2022 13:57       Discharge Exam: Vitals:   07/22/22 0553 07/22/22 0834  BP: (!) 135/58 133/62  Pulse: 70 74  Resp: 18 18  Temp: 97.9 F (36.6 C) 98 F (36.7 C)  SpO2: 97% 98%   General exam: Appears calm and comfortable  Respiratory system: Clear to auscultation. Respiratory effort normal. No respiratory distress. No conversational dyspnea.  Cardiovascular system: S1 & S2 heard, RRR. No murmurs. No pedal edema. Gastrointestinal system: Abdomen is nondistended, soft and nontender. Normal bowel sounds heard. Central nervous system: Alert and oriented. +slowed speech but clear and coherent  Extremities: Symmetric in appearance  Skin: No rashes, lesions or ulcers on exposed skin  Psychiatry: Judgement and insight appear normal. Mood & affect appropriate.     The results of significant diagnostics from this hospitalization (including imaging, microbiology, ancillary and laboratory) are listed below for reference.     Microbiology: No results found for this or any previous visit (from the past 240 hour(s)).  Labs: BNP  (last 3 results) Recent Labs    07/19/22 1630  BNP 30.0   Basic Metabolic Panel: Recent Labs  Lab 07/16/22 1620 07/17/22 0400 07/19/22 1412 07/20/22 0553  NA 139 140 140 140  K 3.0* 3.6 3.7 3.6  CL 103 105 100 104  CO2 28 30 28 28   GLUCOSE 103* 110* 125* 107*  BUN 15 12 12 13   CREATININE 0.96 0.72 0.94 0.78  CALCIUM 9.8 9.3 10.6* 9.7   Liver Function Tests: Recent Labs  Lab 07/17/22 0400 07/19/22 1412 07/20/22 0553  AST 18 24 17   ALT 14 12 13   ALKPHOS 65 71 58  BILITOT 0.8 0.9 0.9  PROT 7.2 8.5* 7.0  ALBUMIN 3.8 4.4 3.6   No results for input(s): "LIPASE", "AMYLASE" in the last 168 hours. No results for input(s): "AMMONIA" in the last 168 hours. CBC: Recent Labs  Lab 07/16/22 1620 07/17/22 0400 07/19/22 1412 07/20/22 0553  WBC 9.9 8.0 5.8 5.7  NEUTROABS 6.1  --  3.6  --   HGB 12.2 11.5* 12.8 11.3*  HCT 38.2 36.0 38.6 34.8*  MCV 90.1 90.9 87.3 89.0  PLT 244 227 285 231   Cardiac Enzymes: No results for input(s): "CKTOTAL", "CKMB", "CKMBINDEX", "TROPONINI" in the last 168 hours. BNP: Invalid input(s): "POCBNP" CBG: Recent Labs  Lab 07/19/22 1421  GLUCAP 101*   D-Dimer No results for input(s): "DDIMER" in the last 72 hours. Hgb A1c No results for input(s): "HGBA1C" in the last 72 hours. Lipid Profile No results for input(s): "CHOL", "HDL", "LDLCALC", "TRIG", "CHOLHDL", "LDLDIRECT" in the last 72 hours. Thyroid function studies No results for input(s): "TSH", "T4TOTAL", "T3FREE", "THYROIDAB" in the last 72 hours.  Invalid input(s): "FREET3" Anemia work up No results for input(s): "VITAMINB12", "FOLATE", "FERRITIN", "TIBC", "IRON", "RETICCTPCT" in the last 72 hours. Urinalysis    Component Value Date/Time   COLORURINE YELLOW (A) 07/19/2022 2035   APPEARANCEUR CLEAR (A) 07/19/2022 2035   APPEARANCEUR Clear 11/12/2013 1420   LABSPEC >1.046 (H) 07/19/2022 2035   LABSPEC 1.026 11/12/2013 1420   PHURINE 8.0 07/19/2022 2035   GLUCOSEU NEGATIVE  07/19/2022 2035   GLUCOSEU Negative 11/12/2013 1420   HGBUR NEGATIVE 07/19/2022 2035   BILIRUBINUR NEGATIVE 07/19/2022 2035   BILIRUBINUR negative 11/08/2018 1453   BILIRUBINUR Negative 11/12/2013 1420   [place] 07/19/2022 2035   PROTEINUR NEGATIVE 07/19/2022 2035   UROBILINOGEN negative (A) 11/08/2018 1453   NITRITE NEGATIVE 07/19/2022 2035   LEUKOCYTESUR NEGATIVE 07/19/2022 2035   LEUKOCYTESUR Negative 11/12/2013 1420   Sepsis Labs Recent Labs  Lab 07/16/22 1620 07/17/22 0400 07/19/22 1412 07/20/22 0553  WBC 9.9 8.0 5.8 5.7   Microbiology No results found for this or any previous visit (from the past 240 hour(s)).   Patient was seen and examined on the day of discharge and was found to be in stable condition. Time coordinating discharge: 40 minutes including assessment and coordination of care, as well as examination of the patient.   SIGNED:  Dessa Phi, DO [facility] 07/22/2022, 2:32 PM

## 2022-07-22 NOTE — Progress Notes (Signed)
Patient had a catatonic like episode when ambulating with me back from the bathroom. When she got to the elevated molding she stopped moving and talking and stood in a trance-like state for 5-6 minutes. She stated she could hear me and could see me but was unable to talk or move. At the end of the episode her speech was notably slower and seemed to be more "forced." Patient ambulated back to chair with walker and stand by assist. VSS. Both attending MD and psych MD made aware of events.

## 2022-07-22 NOTE — Consult Note (Signed)
[facility] Psychiatry Consult   Reason for Consult:  F/U Psychiatry Referring Physician:  Dessa Phi, DO  Patient Identification: Carolyn Campbell MRN:  [id number] Principal Diagnosis: Acute metabolic encephalopathy Diagnosis:  Principal Problem:   Acute metabolic encephalopathy Active Problems:   Crohn's disease of both small and large intestine with rectal bleeding ([place])   Hypothyroidism   Glaucoma associated with chamber angle anomalies   Cognitive deficits as late effect of cerebrovascular disease   Benign essential HTN   Major depression in remission ([place])   Nonobstructive CAD (coronary artery disease)   Pulmonary embolism ([place])   Total Time spent with patient: 15 minutes  Subjective:   Carolyn Campbell is a 66 y.o. female patient admitted with altered mental status.  HPI: [name] is seen on follow-up from psychiatry to rule out conversion disorder.  Her speech and behavior are basically unchanged from yesterday.  She is alert and oriented x 3.  She is pleasant and cooperative.  She answers questions appropriately and her thought process is goal directed.  She tells me that when she became short of breath and the ambulance came to the house she was really dizzy before she got in the ambulance and felt like she was going to faint.  That is when she basically went catatonic at the time.  She said she could hear everyone and knew what was going on but could not respond.  Today, her speech is still delayed and she does not have any trouble finding her words.  She is not sure as to what happened.  She denies any shortness of breath.  She denies depression or suicidal ideation.  I spoke with neurology who thought that this might be conversion disorder because she was so fixated on her shortness of breath and they could not find anything organic.  It would be very odd presentation for conversion disorder but regardless there is really nothing for psychiatry to treat.  Past Psychiatric  History: Sees Dr. Modesta Messing outpatient and is doing well on Zoloft.  Risk to Self:   Risk to Others:   Prior Inpatient Therapy:   Prior Outpatient Therapy:    Past Medical History:  Past Medical History:  Diagnosis Date   Abdominal pain, epigastric    Allergic rhinitis    Arthritis    Asthma    Breast cancer ([place]) 2006   LT LUMPECTOMY   Clotting disorder ([place])    Cognitive deficits as late effect of cerebrovascular disease    COPD (chronic obstructive pulmonary disease) ([place])    COVID-19 virus infection 04/2021   Crohn's disease ([place]) 05/21/2015   FOLLOWED BY GI   Crohn's disease of both small and large intestine with rectal bleeding ([place]) 12/04/2014   Depression    Currently taking zoloft.   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 05/2021 Echo: EF 60-65%, no rwma, nl RV fxn.   History of kidney stones    Hypercholesterolemia 01/18/2007   Hyperlipidemia    Hypertension    Hypothyroidism    Low back pain radiating to left leg 06/17/2021   Nonobstructive CAD (coronary artery disease)    a. 10/2012 Cath: diffuse minor irregs-->med rx; b. 08/2021 Cor CTA: Ca2+ = 55.6 (77th%'ile), LAD 25p, LCX <25p. No signif non-cardiac findings.   Occlusion,  cerebral artery    NOS w/infarction   Osteoporosis    Overweight (BMI 25.0-29.9) 07/17/2022   Personal history of radiation therapy 2006   BREAST CA   Pulmonary embolism ([place])    a.04/2021 CTA Chest: Small filling defects are noted in upper and lower lobe branches of L PA-->acute PE-->eliquis. *PE occurred in context of COVID infxn.   Stroke [facility])    Ulcer     Past Surgical History:  Procedure Laterality Date   BREAST BIOPSY Left 2006   POS   BREAST LUMPECTOMY Left 09/20/2004   positive   BREAST SURGERY Left    malignant biopsy   CARDIAC CATHETERIZATION      COLONOSCOPY  2015   COLONOSCOPY WITH PROPOFOL N/A 06/08/2017   Procedure: COLONOSCOPY WITH PROPOFOL;  Surgeon: Lin Landsman, MD;  Location: [place];  Service: Gastroenterology;  Laterality: N/A;   COLONOSCOPY WITH PROPOFOL N/A 03/08/2019   Procedure: COLONOSCOPY WITH PROPOFOL;  Surgeon: Lin Landsman, MD;  Location: [facility] ENDOSCOPY;  Service: Gastroenterology;  Laterality: N/A;   COLONOSCOPY WITH PROPOFOL N/A 07/16/2020   Procedure: COLONOSCOPY WITH PROPOFOL;  Surgeon: Lin Landsman, MD;  Location[facility] ENDOSCOPY;  Service: Gastroenterology;  Laterality: N/A;   ESOPHAGOGASTRODUODENOSCOPY  2015   ESOPHAGOGASTRODUODENOSCOPY (EGD) WITH PROPOFOL N/A 06/08/2017   Procedure: ESOPHAGOGASTRODUODENOSCOPY (EGD) WITH PROPOFOL;  Surgeon: Lin Landsman, MD;  Location: [place];  Service: Gastroenterology;  Laterality: N/A;   ESOPHAGOGASTRODUODENOSCOPY (EGD) WITH PROPOFOL N/A 03/08/2019   Procedure: ESOPHAGOGASTRODUODENOSCOPY (EGD) WITH PROPOFOL;  Surgeon: Lin Landsman, MD;  Location: [facility] ENDOSCOPY;  Service: Gastroenterology;  Laterality: N/A;   FINGER SURGERY     Right small finger   FRACTURE SURGERY     GIVENS CAPSULE STUDY  2015   TUBAL LIGATION     Family History:  Family History  Problem Relation Age of Onset   Heart disease Mother    Heart attack Mother    Cerebrovascular Accident Father    Arthritis Father    Hypertension Father    Breast cancer Sister    Breast cancer Sister 60   Cancer Sister    Hypertension Other    Diabetes Other    Heart attack Other 51   Family Psychiatric  History: Unremarkable Social History:  Social History   Substance and Sexual Activity  Alcohol Use No   Alcohol/week: 0.0 standard drinks of alcohol     Social History   Substance and Sexual Activity  Drug Use No    Social History   Socioeconomic History   Marital status: Married    Spouse name: Proofreader   Number of children: 2   Years of  education: Not on file   Highest education level: Some college, no degree  Occupational History   Not on file  Tobacco Use   Smoking status: Never   Smokeless tobacco: Never  Vaping Use   Vaping Use: Never used  Substance and Sexual Activity   Alcohol use: No    Alcohol/week: 0.0 standard drinks of alcohol   Drug use: No   Sexual activity: Yes    Partners: Male    Birth control/protection: None  Other Topics Concern   Not on file  Social History Narrative   Married. One son and one daughter. She is disabled after she had breast cancer and strokes. 2 caffeinated beverages daily.   Social Determinants of Health   Financial Resource Strain: Low Risk  (11/06/2019)   Overall Financial Resource Strain (CARDIA)  Difficulty of Paying Living Expenses: Not hard at all  Food Insecurity: No Food Insecurity (07/20/2022)   Hunger Vital Sign    Worried About Running Out of Food in the Last Year: Never true    Ran Out of Food in the Last Year: Never true  Transportation Needs: No Transportation Needs (07/20/2022)   PRAPARE - Hydrologist (Medical): No    Lack of Transportation (Non-Medical): No  Physical Activity: Insufficiently Active (11/06/2019)   Exercise Vital Sign    Days of Exercise per Week: 2 days    Minutes of Exercise per Session: 30 min  Stress: No Stress Concern Present (11/06/2019)   [place]    Feeling of Stress : Not at all  Social Connections: Moderately Integrated (11/06/2019)   Social Connection and Isolation Panel [NHANES]    Frequency of Communication with Friends and Family: More than three times a week    Frequency of Social Gatherings with Friends and Family: Three times a week    Attends Religious Services: More than 4 times per year    Active Member of Clubs or Organizations: No    Attends Archivist Meetings: Never    Marital Status: Married   Additional  Social History:    Allergies:  No Known Allergies  Labs: No results found for this or any previous visit (from the past 48 hour(s)).  Current Facility-Administered Medications  Medication Dose Route Frequency Provider Last Rate Last Admin   acetaminophen (TYLENOL) tablet 650 mg  650 mg Oral Q6H PRN Para Skeans, MD   650 mg at 07/22/22 0017   Or   acetaminophen (TYLENOL) suppository 650 mg  650 mg Rectal Q6H PRN Para Skeans, MD       apixaban Arne [place]) tablet 10 mg  10 mg Oral BID Dibia, Manfred Shirts, MD   10 mg at 07/22/22 4944   Followed by   Derrill Memo ON 07/26/2022] apixaban (ELIQUIS) tablet 5 mg  5 mg Oral BID Dibia, Manfred Shirts, MD       losartan (COZAAR) tablet 50 mg  50 mg Oral Daily Tollie Eth F, RPH       And   hydrochlorothiazide (HYDRODIURIL) tablet 12.5 mg  12.5 mg Oral Daily Tollie Eth F, [facility]       HYDROcodone-acetaminophen (NORCO/VICODIN) 5-325 MG per tablet 1 tablet  1 tablet Oral Q4H PRN Para Skeans, MD   1 tablet at 07/21/22 1945   latanoprost (XALATAN) 0.005 % ophthalmic solution 1 drop  1 drop Both Eyes QHS Para Skeans, MD   1 drop at 07/20/22 2044   levothyroxine (SYNTHROID) tablet 50 mcg  50 mcg Oral QAC breakfast Para Skeans, MD   50 mcg at 07/22/22 0554   morphine (PF) 2 MG/ML injection 2 mg  2 mg Intravenous Q4H PRN Para Skeans, MD       moxifloxacin (VIGAMOX) 0.5 % ophthalmic solution 1 drop  1 drop Both Eyes TID Para Skeans, MD   1 drop at 07/22/22 0833   rosuvastatin (CRESTOR) tablet 40 mg  40 mg Oral Daily [place] Eric J, DO   40 mg at 07/22/22 9675   sertraline (ZOLOFT) tablet 50 mg  50 mg Oral Daily [place] Eric J, DO   50 mg at 07/22/22 9163   sodium chloride flush (NS) 0.9 % injection 3 mL  3 mL Intravenous Q12H Para Skeans, MD   3  mL at 07/22/22 5732    Musculoskeletal: Strength & Muscle Tone: within normal limits Gait & Station:  Unobserved Patient leans: N/A            Psychiatric Specialty Exam:  Presentation   General Appearance: No data recorded Eye Contact:No data recorded Speech:No data recorded Speech Volume:No data recorded Handedness:No data recorded  Mood and Affect  Mood:No data recorded Affect:No data recorded  Thought Process  Thought Processes:No data recorded Descriptions of Associations:No data recorded Orientation:No data recorded Thought Content:No data recorded History of Schizophrenia/Schizoaffective disorder:No data recorded Duration of Psychotic Symptoms:No data recorded Hallucinations:No data recorded Ideas of Reference:No data recorded Suicidal Thoughts:No data recorded Homicidal Thoughts:No data recorded  Sensorium  Memory:No data recorded Judgment:No data recorded Insight:No data recorded  Executive Functions  Concentration:No data recorded Attention Span:No data recorded Recall:No data recorded Fund of Knowledge:No data recorded Language:No data recorded  Psychomotor Activity  Psychomotor Activity:No data recorded  Assets  Assets:No data recorded  Sleep  Sleep:No data recorded  Physical Exam: Physical Exam Psychiatric:        Attention and Perception: Attention and perception normal.        Mood and Affect: Mood and affect normal.        Speech: Speech is delayed.        Behavior: Behavior normal. Behavior is cooperative.        Thought Content: Thought content normal.        Cognition and Memory: Cognition and memory normal.        Judgment: Judgment normal.    Review of Systems  Constitutional: Negative.   HENT: Negative.    Eyes: Negative.   Respiratory: Negative.    Cardiovascular: Negative.   Gastrointestinal: Negative.   Genitourinary: Negative.   Musculoskeletal: Negative.   Skin: Negative.   Neurological:  Positive for speech change.  Endo/Heme/Allergies: Negative.   Psychiatric/Behavioral: Negative.     Blood pressure 133/62, pulse 74, temperature 98 F (36.7 C), temperature source Oral, resp. rate 18, height 5' (1.524  m), weight 66.2 kg, SpO2 98 %. Body mass index is 28.5 kg/m.  Treatment Plan Summary: Plan I would recommend speech therapy evaluation and call us back if there are any changes.  Disposition: No evidence of imminent risk to self or others at present.    Mark, DO 07/22/2022 1:11 PM

## 2022-07-22 NOTE — Progress Notes (Signed)
Dr. Tobias Alexander at bedside to evaluate patient.

## 2022-07-22 NOTE — Progress Notes (Incomplete)
Patient sitting in bed, eating breakfast. Minimal intake noted. Husband at bedside, noted to be eating patient's breakfast, as well.

## 2022-07-22 NOTE — Progress Notes (Signed)
Physical Therapy Treatment Patient Details Name: Carolyn Campbell MRN: [id number] DOB: 02-17-1956 Today's Date: 07/22/2022   History of Present Illness Pt is a 66 y/o F admitted on 07/19/22. Pt was at home & per husband he observed pt to begin breathing fast & called EMS. Upon EMS evaluation pt was weak & unable to walk, felt dizzy, & became unresponsive on the stretcher with pt becoming almost catatonic. Brain MRI was negative. PMH: asthma, breast CA s/p L lumpectomy, COPD, Crohn's disease, depression, dysarthria, glaucoma, hypercholesterolemia, HLD, low back pain radiating to LLE, nonobstructive CAD, cerebral artery occlusion, L sided PE, stroke    PT Comments    Pt received in chair with family by her side agreeable to therapy interventions. Pt  and sister reported that her last and recent freezing spell was around noon when she walked to the bathroom with nurse. Nurse reported that another one was yesterday while she was sitting in the chair. Pt also added that she can tell when its is going to happen and communicates with blinking eyes. PT ambulated in room to bathroom and hallway with FWW with CGA with 4 episodes of freezing lasting ~10 to 15 secs where pt maintains standing balance but stopped walking, talking, with severe flat effect. Pt unable to recall 3 words given to memorize. It appears that the frequency of freezing episodes increases with mobility.  Pt is awaiting discharge to [facility] for further work up. However, pt is A and O x 4. Pt will benefit form Home health max including  PT/OT and SLP  after acute.   Recommendations for follow up therapy are one component of a multi-disciplinary discharge planning process, led by the attending physician.  Recommendations may be updated based on patient status, additional functional criteria and insurance authorization.  Follow Up Recommendations  Home health PT (PT/OT/Speech therapy)     Assistance Recommended at Discharge Frequent or  constant Supervision/Assistance  Patient can return home with the following A lot of help with walking and/or transfers;A lot of help with bathing/dressing/bathroom;Assistance with cooking/housework;Assist for transportation;Help with stairs or ramp for entrance;Direct supervision/assist for medications management;Assistance with feeding   Equipment Recommendations  Rolling walker (2 wheels)    Recommendations for Other Services       Precautions / Restrictions Precautions Precautions: Fall Restrictions Weight Bearing Restrictions: No     Mobility  Bed Mobility               General bed mobility comments: received in chair    Transfers Overall transfer level: Needs assistance Equipment used: Rolling walker (2 wheels) Transfers: Sit to/from Stand Sit to Stand: Supervision                Ambulation/Gait Ambulation/Gait assistance: Min guard Gait Distance (Feet): 130 Feet Assistive device: Rolling walker (2 wheels) Gait Pattern/deviations: Step-through pattern, Decreased stride length Gait velocity: dec     General Gait Details: Pt froze 4 times where pt stopped moving, takling, making eye contact with flat effect.   Stairs             Wheelchair Mobility    Modified Rankin (Stroke Patients Only)       Balance Overall balance assessment: Needs assistance Sitting-balance support: Feet supported, No upper extremity supported Sitting balance-Leahy Scale: Good     Standing balance support: Bilateral upper extremity supported, During functional activity Standing balance-Leahy Scale: Fair  Cognition Arousal/Alertness: Awake/alert Behavior During Therapy: WFL for tasks assessed/performed Overall Cognitive Status: Within Functional Limits for tasks assessed Area of Impairment: Orientation, Attention                 Orientation Level: Person, Place, Time, Situation Current Attention Level:  Alternating Memory: Decreased recall of precautions, Decreased short-term memory Following Commands: Follows one step commands consistently Safety/Judgement: Decreased awareness of safety, Decreased awareness of deficits Awareness: Intellectual, Emergent, Anticipatory Problem Solving: Slow processing General Comments: pt with difficulty with expressions and verbalizing, however is receptive and understands/responds to cues        Exercises      General Comments        Pertinent Vitals/Pain Pain Assessment Pain Assessment: No/denies pain    Home Living                          Prior Function            PT Goals (current goals can now be found in the care plan section) Progress towards PT goals: Progressing toward goals    Frequency    Min 2X/week      PT Plan Current plan remains appropriate    Co-evaluation              AM-PAC PT "6 Clicks" Mobility   Outcome Measure  Help needed turning from your back to your side while in a flat bed without using bedrails?: None Help needed moving from lying on your back to sitting on the side of a flat bed without using bedrails?: A Little Help needed moving to and from a bed to a chair (including a wheelchair)?: A Little Help needed standing up from a chair using your arms (e.g., wheelchair or bedside chair)?: A Little Help needed to walk in hospital room?: A Lot Help needed climbing 3-5 steps with a railing? : A Lot 6 Click Score: 17    End of Session Equipment Utilized During Treatment: Gait belt Activity Tolerance: Patient tolerated treatment well Patient left: in chair;with family/visitor present;with call bell/phone within reach Nurse Communication: Mobility status PT Visit Diagnosis: Unsteadiness on feet (R26.81);Muscle weakness (generalized) (M62.81);Difficulty in walking, not elsewhere classified (R26.2)     Time: 1916-6060 PT Time Calculation (min) (ACUTE ONLY): 22 min  Charges:  $Gait  Training: 8-22 mins                    Demontez Novack PT DPT 3:10 PM,07/22/22

## 2022-07-22 NOTE — Progress Notes (Signed)
Brief narrative: 66 year old female with a history of anxiety, recently diagnosed with memory disorder and started on rivastigmine as well as recent diagnosis of pulmonary embolism who presented on 10/30.  Initially, she was complaining of shortness of breath at home, and due to her distress with her shortness of breath 911 was called.  There was no concern for neurological symptoms prior to EMS arrival.  It was with EMS in transport that she developed decreased responsiveness.  A code stroke was activated, and she was evaluated in the emergency department and seemed to have a significantly inconsistent exam.  Due to her persistent symptoms, MRI and EEG have been obtained which are both negative.  Subjective: She continues to have intermittent spells of decreased responsiveness.  Interictally, her speech is very slow.  Exam: Vitals:   07/22/22 0553 07/22/22 0834  BP: (!) 135/58 133/62  Pulse: 70 74  Resp: 18 18  Temp: 97.9 F (36.6 C) 98 F (36.7 C)  SpO2: 97% 98%   Gen: In bed, NAD Resp: non-labored breathing, no acute distress Abd: soft, nt  Neuro: MS: She fixates and tracks across midline in both directions.  She is able to talk to me today, able to answer questions and name objects.  Her speech is slow and deliberate, but not hesitant in the way expressive aphasia typically is. CN: Extraocular movements intact, endorses vision in all four quadrants Motor: No drift Sensory: Intact to light touch   Impression:  66 year old female who presents with decreased interactivity.  She has an inconsistent exam and has had normal imaging and EEG.  My strong suspicion at this point is for conversion disorder.  Catatonia would be another consideration, but given the waxing and waning nature, I think that this is less likely.   She continues to have intermittent episodes.  Given the episodic nature and persistence of symptoms, I do think that it would be reasonable to further characterize these  episodes with EEG to evaluate for intermittent frontal seizures.  My suspicion for this is low, but given how discordant the management would be for conversion disorder versus frontal seizures, as well as the frequency of the events, I do think that transfer for continuous EEG and spell characterization would be prudent.  She did not have clinical response to Ativan when initially tried on arrival.  Recommendations: 1) transfer for LTM EEG  Roland Rack, MD [facility] [phone]  If 7pm- 7am, please page neurology on call as listed in [place].

## 2022-07-22 NOTE — Progress Notes (Signed)
Report called to [facility] spoke with Aaron Edelman RN

## 2022-07-22 NOTE — H&P (Signed)
History and Physical    PLEASE NOTE THAT DRAGON DICTATION SOFTWARE WAS USED IN THE CONSTRUCTION OF THIS NOTE.   Carolyn Campbell QQP:[id number] DOB: [birth year] DOA: 07/22/2022  PCP: Delsa Grana, PA-C  Patient coming from: home   I have personally briefly reviewed patient's old medical records in [place]  Chief Complaint: episodes of diminished responsiveness.   HPI: Carolyn Campbell is a 66 y.o. female with medical history significant for recent PE, HTN, HLD, hypothyroidism, depression, who is admitted to [facility] on 07/22/2022 by way of transfer from [place] with intermittent episodes of diminished responsiveness after being admitted to [facility] for this on 07/19/22.   Pt was admitted to [facility] on 10/30 with intermittent episodes of diminished responsiveness. Dr. Leonel Ramsay was consulted/was following at [facility], and had recommended transfer to [facility] for LTM EEG to eval for intermittent frontal seizures, with his differential also including conversion disorder, after imaging at [facility] including mri brain and ct head showed no acute process, while eeg showed no overt abnormality. In spite of this , patient had two additional self limited episodes of diminished responsiveness during [facility] course, 1 episode on 11/1 and another similar episode on 11/2.no known h/o prior seizures. [facility] course also notable for psych consultation.    Each of these episodes have been self-limited, terminating in the absence of any interval benzodiazepines.  With the exception of some slowed speech, patient's mental status reportedly has returned to baseline the above episodes.   Medical history notable for recently diagnosed acute pulmonary embolism per CTA chest performed at [facility] on 07/16/2022, prompting initiation of Eliquis.  Per chart review, baseline hemoglobin range appears to be 11-13.   Additional laboratory evaluation today received was notable for the following: CMP on 07/10/2022 notable for creatinine 0.78.   Urinalysis showed no evidence of blood cells, while urinary tract symptoms been negative.  Most recent CBC, performed on 07/20/2022 notable for will with cell count 5500.  Additionally, in evaluating the patient's presenting episode of acutely diminished responsiveness, repeat CTA chest on 07/19/2022, was limited by the presence of motion artifact, straightening of right evidence of acute process, including no evidence of infiltrate.     Review of Systems: As per HPI otherwise 10 point review of systems negative.   Past Medical History:  Diagnosis Date   Abdominal pain, epigastric    Allergic rhinitis    Arthritis    Asthma    Breast cancer ([place]) 2006   LT LUMPECTOMY   Clotting disorder ([place])    Cognitive deficits as late effect of cerebrovascular disease    COPD (chronic obstructive pulmonary disease) ([place])    COVID-19 virus infection 04/2021   Crohn's disease ([place]) 05/21/2015   FOLLOWED BY GI   Crohn's disease of both small and large intestine with rectal bleeding ([place]) 12/04/2014   Depression    Currently taking zoloft.   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 05/2021 Echo: EF 60-65%, no rwma, nl RV fxn.   History of kidney stones    Hypercholesterolemia 01/18/2007   Hyperlipidemia    Hypertension    Hypothyroidism    Low back pain radiating to left leg 06/17/2021   Nonobstructive CAD (coronary artery disease)    a. 10/2012 Cath: diffuse minor irregs-->med rx; b.  08/2021 Cor CTA: Ca2+ = 55.6 (77th%'ile), LAD 25p, LCX <25p. No signif non-cardiac findings.   Occlusion, cerebral artery    NOS w/infarction   Osteoporosis    Overweight (BMI 25.0-29.9) 07/17/2022   Personal history of radiation therapy 2006   BREAST CA   Pulmonary embolism ([place])    a.04/2021 CTA Chest: Small filling defects are  noted in upper and lower lobe branches of L PA-->acute PE-->eliquis. *PE occurred in context of COVID infxn.   Stroke [facility])    Ulcer     Past Surgical History:  Procedure Laterality Date   BREAST BIOPSY Left 2006   POS   BREAST LUMPECTOMY Left 09/20/2004   positive   BREAST SURGERY Left    malignant biopsy   CARDIAC CATHETERIZATION     COLONOSCOPY  2015   COLONOSCOPY WITH PROPOFOL N/A 06/08/2017   Procedure: COLONOSCOPY WITH PROPOFOL;  Surgeon: Lin Landsman, MD;  Location: [place];  Service: Gastroenterology;  Laterality: N/A;   COLONOSCOPY WITH PROPOFOL N/A 03/08/2019   Procedure: COLONOSCOPY WITH PROPOFOL;  Surgeon: Lin Landsman, MD;  Location: [facility] ENDOSCOPY;  Service: Gastroenterology;  Laterality: N/A;   COLONOSCOPY WITH PROPOFOL N/A 07/16/2020   Procedure: COLONOSCOPY WITH PROPOFOL;  Surgeon: Lin Landsman, MD;  Location: [facility] ENDOSCOPY;  Service: Gastroenterology;  Laterality: N/A;   ESOPHAGOGASTRODUODENOSCOPY  2015   ESOPHAGOGASTRODUODENOSCOPY (EGD) WITH PROPOFOL N/A 06/08/2017   Procedure: ESOPHAGOGASTRODUODENOSCOPY (EGD) WITH PROPOFOL;  Surgeon: Lin Landsman, MD;  Location: [place];  Service: Gastroenterology;  Laterality: N/A;   ESOPHAGOGASTRODUODENOSCOPY (EGD) WITH PROPOFOL N/A 03/08/2019   Procedure: ESOPHAGOGASTRODUODENOSCOPY (EGD) WITH PROPOFOL;  Surgeon: Lin Landsman, MD;  Location: [facility] ENDOSCOPY;  Service: Gastroenterology;  Laterality: N/A;   FINGER SURGERY     Right small finger   FRACTURE SURGERY     GIVENS CAPSULE STUDY  2015   TUBAL LIGATION      Social History:  reports that she has never smoked. She has never used smokeless tobacco. She reports that she does not drink alcohol and does not use drugs.   No Known Allergies  Family History  Problem Relation Age of Onset   Heart disease Mother    Heart attack Mother    Cerebrovascular Accident Father    Arthritis Father    Hypertension Father     Breast cancer Sister    Breast cancer Sister 56   Cancer Sister    Hypertension Other    Diabetes Other    Heart attack Other 103    Prior to Admission medications   Medication Sig Start Date End Date Taking? Authorizing Provider  apixaban (ELIQUIS) 5 MG TABS tablet Take 1 tablet (5 mg total) by mouth 2 (two) times daily. 08/14/22   Annita Brod, MD  Apixaban Starter Pack, 29m and 57m (ELIQUIS DVT/PE STARTER PACK) Take as directed on package: start with two-30m94mablets twice daily for 7 days. On day 8, switch to one-30mg62mblet twice daily. 07/17/22   KrisAnnita Brod  latanoprost (XALATAN) 0.005 % ophthalmic solution Place 1 drop into both eyes at bedtime.  04/22/16   [provider]  levothyroxine (SYNTHROID) 50 MCG tablet Take 1 tablet (50 mcg total) by mouth daily before breakfast. On empty stomach 06/01/22   TapiDelsa Grana-C  losartan-hydrochlorothiazide (HYZAAR) 50-12.5 MG tablet Take 1 tablet by mouth daily. 08/06/21   BergTheora Gianotti  moxifloxacin (VIGAMOX) 0.5 % ophthalmic solution Apply to eye. 06/04/22   [provider]  Multiple Vitamin (MULTIVITAMIN) tablet Take 1 tablet by mouth daily.    [provider]  neomycin-polymyxin b-dexamethasone (MAXITROL) 3.5-10000-0.1 OINT SMARTSIG:sparingly In Eye(s) Twice Daily 05/10/22   [provider]  rosuvastatin (CRESTOR) 40 MG tablet Take 1 tablet (40 mg total) by mouth daily. 06/01/22 05/27/23  Delsa Grana, PA-C  sertraline (ZOLOFT) 50 MG tablet Take 1 tablet (50 mg total) by mouth daily. 06/03/22 09/01/22  Norman Clay, MD  triamcinolone ointment (KENALOG) 0.1 % Apply to affected skin one to two times daily for up to two weeks Patient taking differently: 1 Application 2 (two) times daily as needed. Apply to affected skin one to two times daily for up to two weeks 11/05/20   Delsa Grana, PA-C  valACYclovir (VALTREX) 1000 MG tablet Take 1 tablet (1,000 mg total) by mouth 2 (two) times  daily. Patient taking differently: Take 1,000 mg by mouth 2 (two) times daily as needed. 12/18/21   Delsa Grana, PA-C     Objective    Physical Exam: Vitals:   07/22/22 2139  BP: (!) 166/63  Pulse: 88  Resp: 18  Temp: 98.2 F (36.8 C)  TempSrc: Oral  SpO2: 97%    General: appears to be stated age; alert, oriented Skin: warm, dry, no rash Head:  AT/[place] Mouth:  Oral mucosa membranes appear moist, normal dentition Neck: supple; trachea midline Heart:  RRR; did not appreciate any M/R/G Lungs: CTAB, did not appreciate any wheezes, rales, or rhonchi Abdomen: + BS; soft, ND, NT Vascular: 2+ pedal pulses b/l; 2+ radial pulses b/l Extremities: no peripheral edema, no muscle wasting Neuro: strength and sensation intact in upper and lower extremities b/l  Labs on Admission: I have personally reviewed following labs and imaging studies  CBC: Recent Labs  Lab 07/16/22 1620 07/17/22 0400 07/19/22 1412 07/20/22 0553  WBC 9.9 8.0 5.8 5.7  NEUTROABS 6.1  --  3.6  --   HGB 12.2 11.5* 12.8 11.3*  HCT 38.2 36.0 38.6 34.8*  MCV 90.1 90.9 87.3 89.0  PLT 244 227 285 161   Basic Metabolic Panel: Recent Labs  Lab 07/16/22 1620 07/17/22 0400 07/19/22 1412 07/20/22 0553  NA 139 140 140 140  K 3.0* 3.6 3.7 3.6  CL 103 105 100 104  CO2 28 30 28 28   GLUCOSE 103* 110* 125* 107*  BUN 15 12 12 13   CREATININE 0.96 0.72 0.94 0.78  CALCIUM 9.8 9.3 10.6* 9.7   GFR: Estimated Creatinine Clearance: 57.2 mL/min (by C-G formula based on SCr of 0.78 mg/dL). Liver Function Tests: Recent Labs  Lab 07/17/22 0400 07/19/22 1412 07/20/22 0553  AST 18 24 17   ALT 14 12 13   ALKPHOS 65 71 58  BILITOT 0.8 0.9 0.9  PROT 7.2 8.5* 7.0  ALBUMIN 3.8 4.4 3.6   No results for input(s): "LIPASE", "AMYLASE" in the last 168 hours. No results for input(s): "AMMONIA" in the last 168 hours. Coagulation Profile: Recent Labs  Lab 07/16/22 1622 07/19/22 1412  INR 1.0 1.8*   Cardiac Enzymes: No  results for input(s): "CKTOTAL", "CKMB", "CKMBINDEX", "TROPONINI" in the last 168 hours. BNP (last 3 results) No results for input(s): "PROBNP" in the last 8760 hours. HbA1C: No results for input(s): "HGBA1C" in the last 72 hours. CBG: Recent Labs  Lab 07/19/22 1421  GLUCAP 101*   Lipid Profile: No results for input(s): "CHOL", "HDL", "LDLCALC", "TRIG", "CHOLHDL", "LDLDIRECT" in the last 72 hours. Thyroid Function Tests: No results for input(s): "TSH", "T4TOTAL", "FREET4", "T3FREE", "THYROIDAB" in the  last 72 hours. Anemia Panel: No results for input(s): "VITAMINB12", "FOLATE", "FERRITIN", "TIBC", "IRON", "RETICCTPCT" in the last 72 hours. Urine analysis:    Component Value Date/Time   COLORURINE YELLOW (A) 07/19/2022 2035   APPEARANCEUR CLEAR (A) 07/19/2022 2035   APPEARANCEUR Clear 11/12/2013 1420   LABSPEC >1.046 (H) 07/19/2022 2035   LABSPEC 1.026 11/12/2013 1420   PHURINE 8.0 07/19/2022 2035   GLUCOSEU NEGATIVE 07/19/2022 2035   GLUCOSEU Negative 11/12/2013 1420   HGBUR NEGATIVE 07/19/2022 2035   BILIRUBINUR NEGATIVE 07/19/2022 2035   BILIRUBINUR negative 11/08/2018 1453   BILIRUBINUR Negative 11/12/2013 [place] 07/19/2022 2035   PROTEINUR NEGATIVE 07/19/2022 2035   UROBILINOGEN negative (A) 11/08/2018 1453   NITRITE NEGATIVE 07/19/2022 2035   LEUKOCYTESUR NEGATIVE 07/19/2022 2035   LEUKOCYTESUR Negative 11/12/2013 1420    Radiological Exams on Admission: No results found.   EKG: Independently reviewed, with result as described above.    Assessment/Plan   Principal Problem:   Acute encephalopathy Active Problems:   Pulmonary embolism (HCC)   Essential hypertension   HLD (hyperlipidemia)   Acquired hypothyroidism      #) Acute encephalopathy: Intermittent self-limited episodes of self-limited episodes responsiveness, of unclear etiology at this time, transfer to [facility] via recommendation of neurology for evaluation of the right  EEG with video rate for underlying intermittent frontal seizures, with neurology also conveying recurrent differential appears to favor conversion disorder.  I have notified on-call neurology, Dr. Malen Gauze, upon the patient's arrival at [facility], and order placed for overnight EEG monitoring with video to further with above.  No overt organic/metabolic source contributing to these intermittent episodes of diminished responsiveness, including no overt evidence of underlying infectious process.  In the context of a documented history of acquired hypothyroidism, will also check TSH.  There is also documentation of history of COPD, for which we will check blood gas to evaluate for any contribution from hypercapnic encephalopathy. MRI brain negative for acute process, as further detailed above.  Holding home potential central- acting medications, as further detailed below.  Plan: Neurology formally consulted, as above.  LTM EEG monitoring.  Add on TSH, VBG.  Repeat CMP/CBC in the morning.  Check serum magnesium level.  Holding home zoloft for now.  Acute hour neurochecks ordered. Depending on results of this work-up, may also benefit from additional psychiatry consultation, which was initiated will still at [facility], as above.            #) Recent diagnosis of acute PE: Diagnosed via CTA 08/16/2022, prompting initiation of Eliquis.  Of note, presenting hemoglobin appears to be within baseline range, as quantified above.  No overt evidence of active bleed.  Plan: Resume home Eliquis.            #) Essential Hypertension: documented h/o such, with outpatient antihypertensive regimen including losartan, HCTZ.  SBP's upon arrival at [facility] in 140's-150's mmHg. renal function appears to be baseline.   Plan: Close monitoring of subsequent BP via routine VS. resume home losartan, holding home HCTZ for now.                #) Hyperlipidemia: documented h/o such. On high intensity rosuvastatin  as outpatient, which will be held for now in setting of presenting recurrent episodes of diminished responsiveness due to potential will central acting ramifications of this medication.    Plan: holding home statin for now.                 #)  acquired hypothyroidism: documented h/o such, on Synthroid as outpatient.   Plan: cont home Synthroid.  Check TSH.    DVT prophylaxis: eliquis  Code Status: Full code Family Communication: none Disposition Plan: Per Rounding Team Consults called: neurology consulted, and I have notified Dr. Rory Percy upon pt's arrival at [facility];  Admission status: inpt    PLEASE NOTE THAT DRAGON DICTATION SOFTWARE WAS USED IN THE CONSTRUCTION OF THIS NOTE.   [place] DO [facility]  From [place]   07/22/2022, 10:14 PM

## 2022-07-22 NOTE — Progress Notes (Signed)
Occupational Therapy Treatment Patient Details Name: Carolyn Campbell MRN: [id number] DOB: 09-14-1956 Today's Date: 07/22/2022   History of present illness Pt is a 65 y/o F admitted on 07/19/22. Pt was at home & per husband he observed pt to begin breathing fast & called EMS. Upon EMS evaluation pt was weak & unable to walk, felt dizzy, & became unresponsive on the stretcher with pt becoming almost catatonic. Brain MRI was negative. PMH: asthma, breast CA s/p L lumpectomy, COPD, Crohn's disease, depression, dysarthria, glaucoma, hypercholesterolemia, HLD, low back pain radiating to LLE, nonobstructive CAD, cerebral artery occlusion, L sided PE, stroke   OT comments  Patient in bed upon arrival with family present and agreeable to OT treatment. Patient with slow speech, but able to follow commands consistently. Patient was able to perform bed mobility with min guard/supervision. Attempted to transfer with 1-person hand held assist, patient unsteady. RW used. Patient ambulated ~30 feet in room to bathroom and transferred to regular toilet with supervision. Peri-care performed with supervision. LB and UB bathing and dressing perform with set up. Patient left in recliner with chair alarm set, call bell in reach, family present in room, and all needs met.    Recommendations for follow up therapy are one component of a multi-disciplinary discharge planning process, led by the attending physician.  Recommendations may be updated based on patient status, additional functional criteria and insurance authorization.    Follow Up Recommendations  Home health OT    Assistance Recommended at Discharge Intermittent Supervision/Assistance  Patient can return home with the following  A little help with walking and/or transfers;A little help with bathing/dressing/bathroom;Help with stairs or ramp for entrance;Direct supervision/assist for medications management;Direct supervision/assist for financial  management;Assistance with cooking/housework;Assist for transportation   Equipment Recommendations  Other (comment) (RW)       Precautions / Restrictions Precautions Precautions: Fall Restrictions Weight Bearing Restrictions: No       Mobility Bed Mobility   Bed Mobility: Supine to Sit     Supine to sit: Supervision          Transfers Overall transfer level: Needs assistance Equipment used: Rolling walker (2 wheels) Transfers: Sit to/from Stand Sit to Stand: Supervision                 Balance Overall balance assessment: Needs assistance Sitting-balance support: Feet supported, No upper extremity supported Sitting balance-Leahy Scale: Fair     Standing balance support: Bilateral upper extremity supported, During functional activity Standing balance-Leahy Scale: Fair                             ADL either performed or assessed with clinical judgement   ADL Overall ADL's : Needs assistance/impaired     Grooming: Applying deodorant;Set up;Sitting   Upper Body Bathing: Set up   Lower Body Bathing: Set up;Sit to/from stand   Upper Body Dressing : Set up   Lower Body Dressing: Set up   Toilet Transfer: Supervision/safety;Min guard   Toileting- Clothing Manipulation and Hygiene: Supervision/safety              Extremity/Trunk Assessment Upper Extremity Assessment Upper Extremity Assessment: Overall WFL for tasks assessed   Lower Extremity Assessment Lower Extremity Assessment: Generalized weakness         Cognition Arousal/Alertness: Awake/alert Behavior During Therapy: WFL for tasks assessed/performed Overall Cognitive Status: Within Functional Limits for tasks assessed  Pertinent Vitals/ Pain       Pain Assessment Pain Assessment: No/denies pain   Frequency  Min 2X/week        Progress Toward Goals  OT Goals(current goals can now be found  in the care plan section)     Acute Rehab OT Goals Patient Stated Goal: return home. OT Goal Formulation: With patient/family Time For Goal Achievement: 08/03/22 Potential to Achieve Goals: Good   AM-PAC OT "6 Clicks" Daily Activity     Outcome Measure   Help from another person eating meals?: None Help from another person taking care of personal grooming?: A Little Help from another person toileting, which includes using toliet, bedpan, or urinal?: A Little Help from another person bathing (including washing, rinsing, drying)?: A Little Help from another person to put on and taking off regular upper body clothing?: None Help from another person to put on and taking off regular lower body clothing?: None 6 Click Score: 21    End of Session Equipment Utilized During Treatment: Rolling walker (2 wheels)  OT Visit Diagnosis: Muscle weakness (generalized) (M62.81);Unsteadiness on feet (R26.81)   Activity Tolerance Patient tolerated treatment well   Patient Left in chair;with call bell/phone within reach;with chair alarm set;with family/visitor present   Nurse Communication Mobility status        Time: 9458-5929 OT Time Calculation (min): 28 min  Charges: OT General Charges $OT Visit: 1 Visit OT Treatments $Self Care/Home Management : 23-37 mins    Terrionna Bridwell, OTS 07/22/2022, 12:55 PM

## 2022-07-22 NOTE — Plan of Care (Signed)

## 2022-07-22 NOTE — Progress Notes (Signed)
While transferring from EMS bed to hospital bed she became unresponsive. She did not respond to moderate painful stimuli, PERRLA eyes were closed but fixed. After about 15-20 seconds she opened her eyes and was tracking but not responding to communication. After approx 5 min she became vocal with very delayed speech.  Admitting Dr Velia Meyer and Sarita Haver Dr  Rory Percy both made aware.

## 2022-07-22 NOTE — Progress Notes (Signed)
Dr. Maylene Roes at bedside assessing patient

## 2022-07-22 NOTE — Progress Notes (Signed)
PROGRESS NOTE    Carolyn Campbell  QZR:[id number] DOB: 1955/12/10 DOA: 07/19/2022 PCP: Delsa Grana, PA-C     Brief Narrative:  Carolyn Campbell is a 66 y.o. female with past medical history significant for history of CVA, major depressive disorder, REM sleep behavior disorder, prediabetes, multifactorial cognitive impairment, Crohn's disease, hypothyroidism, HTN, recent diagnosis of pulmonary embolism on Eliquis who presented to [facility] on 10/30 via EMS with complaints of shortness of breath.  While in route to the ED, EMS reports that patient became "catatonic" and confused with slow response.  Unclear if this was due to patient's cooperation and underlying psychiatric history versus new medication patient started on recently by neurology, rivastigmine.  But family reports this is unlike her typical behavior.   In the ED, temperature 97.5 F, HR 109, RR 21, BP 170/67, SPO2 97% on room air.  WBC 5.8, hemoglobin 12.8, platelet count 285.  Sodium 140, potassium 3.7, chloride 100, CO2 28, glucose 125, BUN 12, creatinine 0.94.  AST 24, ALT 12, total bilirubin 0.9.  BNP 10.5.  High sensitive troponin 5.  INR 1.8.  Urinalysis unrevealing.  EtOH level less than 10.  UDS negative.  CT angiogram chest PE limited exam due to respiratory motion and poor opacification of the pulmonary arteries, no evidence of PE in the main pulmonary arteries no evidence of right heart strain but overall poor exam.  MR brain without contrast with no acute intracranial abnormality, mild chronic microvascular ischemic changes.  Neurology was consulted.  TRH consulted for admission for further evaluation and treatment of confusion and shortness of breath.  Psychiatry consulted to evaluate for conversion disorder.  New events last 24 hours / Subjective: Patient seen and examined with husband and sister-in-law at bedside.  Patient alert and oriented x4 without any evidence of confusion.  She continues to exhibit slow  speech pattern.  Her and her husband also bring up that patient sees Dr. Alba Destine as outpatient for "disc" problem. She receives steroid injections and is due to see her in office later this month. On chart review on Dr. Alba Destine visit on 04/14/22, patient receives SI joint injections for back pain traveling to left leg. Patient and husband wondering if her pain in her leg is to explain her catatonic episodes and speech difficulties. She rates her left leg pain 6/10. She also mentions that with her speech, she is having right abdominal tightness without pain.     Assessment & Plan:   Principal Problem:   Acute metabolic encephalopathy Active Problems:   Pulmonary embolism (HCC)   Cognitive deficits as late effect of cerebrovascular disease   Crohn's disease of both small and large intestine with rectal bleeding (HCC)   Benign essential HTN   Major depression in remission ([place])   Nonobstructive CAD (coronary artery disease)   Glaucoma associated with chamber angle anomalies   Hypothyroidism   Acute metabolic encephalopathy  Hx REM sleep behavior disorder Hx multifactorial cognitive impairment -Follows with neurology outpatient, Dr. Manuella Ghazi.  Was recently placed on rivastigmine for sleep behavior disorder.  On presentation to the ED, EMS reported patient was catatonic and not responding appropriately to commands. Also having slowed speech response -UA, UDS negative  -Neurology and psychiatry consulted -Stop rivastigmine -MRI brain, EEG unremarkable -Unclear etiology  PE -Eliquis  Sciatica -Follow up with Dr. Alba Destine for SI joint injection  Depression -Zoloft  Hypothyroidism -Synthroid  HTN -Losartan-HCTZ   DVT prophylaxis:  apixaban (ELIQUIS) tablet 10 mg  apixaban (ELIQUIS)  tablet 5 mg  Code Status: Full Family Communication: Husband and sister-in-law at bedside  Disposition Plan:  Status is: Inpatient Remains inpatient appropriate because: continued speech difficulties    Consultants:  Neurology Psych   Antimicrobials:  Anti-infectives (From admission, onward)    None        Objective: Vitals:   07/21/22 2031 07/22/22 0101 07/22/22 0553 07/22/22 0834  BP: (!) 142/58  (!) 135/58 133/62  Pulse: 73  70 74  Resp: 20  18 18   Temp: 98.7 F (37.1 C)  97.9 F (36.6 C) 98 F (36.7 C)  TempSrc: Oral  Oral Oral  SpO2: 96%  97% 98%  Weight:  66.2 kg    Height:        Intake/Output Summary (Last 24 hours) at 07/22/2022 1121 Last data filed at 07/22/2022 1033 Gross per 24 hour  Intake 1256.28 ml  Output 2000 ml  Net -743.72 ml   Filed Weights   07/21/22 0003 07/21/22 0500 07/22/22 0101  Weight: 65.7 kg 64.5 kg 66.2 kg    Examination:  General exam: Appears calm and comfortable  Respiratory system: Clear to auscultation. Respiratory effort normal. No respiratory distress. No conversational dyspnea.  Cardiovascular system: S1 & S2 heard, RRR. No murmurs. No pedal edema. Gastrointestinal system: Abdomen is nondistended, soft and nontender. Normal bowel sounds heard. Central nervous system: Alert and oriented. +slowed speech but clear and coherent  Extremities: Symmetric in appearance  Skin: No rashes, lesions or ulcers on exposed skin  Psychiatry: Judgement and insight appear normal. Mood & affect appropriate.   Data Reviewed: I have personally reviewed following labs and imaging studies  CBC: Recent Labs  Lab 07/16/22 1620 07/17/22 0400 07/19/22 1412 07/20/22 0553  WBC 9.9 8.0 5.8 5.7  NEUTROABS 6.1  --  3.6  --   HGB 12.2 11.5* 12.8 11.3*  HCT 38.2 36.0 38.6 34.8*  MCV 90.1 90.9 87.3 89.0  PLT 244 227 285 361   Basic Metabolic Panel: Recent Labs  Lab 07/16/22 1620 07/17/22 0400 07/19/22 1412 07/20/22 0553  NA 139 140 140 140  K 3.0* 3.6 3.7 3.6  CL 103 105 100 104  CO2 28 30 28 28   GLUCOSE 103* 110* 125* 107*  BUN 15 12 12 13   CREATININE 0.96 0.72 0.94 0.78  CALCIUM 9.8 9.3 10.6* 9.7   GFR: Estimated Creatinine  Clearance: 57.2 mL/min (by C-G formula based on SCr of 0.78 mg/dL). Liver Function Tests: Recent Labs  Lab 07/17/22 0400 07/19/22 1412 07/20/22 0553  AST 18 24 17   ALT 14 12 13   ALKPHOS 65 71 58  BILITOT 0.8 0.9 0.9  PROT 7.2 8.5* 7.0  ALBUMIN 3.8 4.4 3.6   No results for input(s): "LIPASE", "AMYLASE" in the last 168 hours. No results for input(s): "AMMONIA" in the last 168 hours. Coagulation Profile: Recent Labs  Lab 07/16/22 1622 07/19/22 1412  INR 1.0 1.8*   Cardiac Enzymes: No results for input(s): "CKTOTAL", "CKMB", "CKMBINDEX", "TROPONINI" in the last 168 hours. BNP (last 3 results) No results for input(s): "PROBNP" in the last 8760 hours. HbA1C: No results for input(s): "HGBA1C" in the last 72 hours. CBG: Recent Labs  Lab 07/19/22 1421  GLUCAP 101*   Lipid Profile: No results for input(s): "CHOL", "HDL", "LDLCALC", "TRIG", "CHOLHDL", "LDLDIRECT" in the last 72 hours. Thyroid Function Tests: No results for input(s): "TSH", "T4TOTAL", "FREET4", "T3FREE", "THYROIDAB" in the last 72 hours. Anemia Panel: No results for input(s): "VITAMINB12", "FOLATE", "FERRITIN", "TIBC", "IRON", "RETICCTPCT" in the  last 72 hours. Sepsis Labs: No results for input(s): "PROCALCITON", "LATICACIDVEN" in the last 168 hours.  No results found for this or any previous visit (from the past 240 hour(s)).    Radiology Studies: EEG adult  Result Date: 07-28-22 Greta Doom, MD     07/28/22  2:15 PM History: 66 year old female being evaluated for decreased responsiveness Sedation: No current sedation, did receive Ativan yesterday Technique: This EEG was acquired with electrodes placed according to the International 10-20 electrode system (including Fp1, Fp2, F3, F4, C3, C4, P3, P4, O1, O2, T3, T4, T5, T6, A1, A2, Fz, Cz, Pz). The following electrodes were missing or displaced: none. Background: The background is dominated by excessive frontally predominant beta activity.  This  does give a sharply contoured appearance to the background, but no clear epileptiform discharges were seen.  There is a posterior dominant rhythm of 10 Hz which is seen at times and attenuates with eye opening.  With sleep, there are bilaterally symmetrical sleep structures observed.  No clearly epileptiform activity was seen. Photic stimulation: Physiologic driving is not performed EEG Abnormalities: Excessive beta activity Clinical Interpretation: This essentially normal EEG is recorded in the waking and sleep state.  The excessive beta activity seen is likely secondary to medication effect.  There was no seizure or seizure predisposition recorded on this study. Please note that lack of epileptiform activity on EEG does not preclude the possibility of epilepsy. Roland Rack, MD [facility] [phone] If 7pm- 7am, please page neurology on call as listed in [place].      Scheduled Meds:  apixaban  10 mg Oral BID   Followed by   Derrill Memo ON 07/26/2022] apixaban  5 mg Oral BID   latanoprost  1 drop Both Eyes QHS   levothyroxine  50 mcg Oral QAC breakfast   losartan-hydrochlorothiazide  1 tablet Oral Daily   moxifloxacin  1 drop Both Eyes TID   rosuvastatin  40 mg Oral Daily   sertraline  50 mg Oral Daily   sodium chloride flush  3 mL Intravenous Q12H   Continuous Infusions:   LOS: 2 days     Dessa Phi, DO [facility] 07/22/2022, 11:21 AM   Available via Epic secure chat 7am-7pm After these hours, please refer to coverage provider listed on [email or web address]

## 2022-07-22 NOTE — Progress Notes (Signed)
Received word from bed placement at [facility] that patient will be placed in 3W07 and has been approved. Will await call from neuro unit for report. Family updated

## 2022-07-22 NOTE — Progress Notes (Signed)
Per Dr Eden Lathe tele monitoring on pt

## 2022-07-23 ENCOUNTER — Inpatient Hospital Stay (HOSPITAL_COMMUNITY): Payer: Medicare Other

## 2022-07-23 ENCOUNTER — Encounter (HOSPITAL_COMMUNITY): Payer: Self-pay | Admitting: Internal Medicine

## 2022-07-23 DIAGNOSIS — R569 Unspecified convulsions: Secondary | ICD-10-CM

## 2022-07-23 DIAGNOSIS — G9340 Encephalopathy, unspecified: Secondary | ICD-10-CM | POA: Diagnosis not present

## 2022-07-23 DIAGNOSIS — R4182 Altered mental status, unspecified: Secondary | ICD-10-CM

## 2022-07-23 LAB — BLOOD GAS, VENOUS
Acid-Base Excess: 5.8 mmol/L — ABNORMAL HIGH (ref 0.0–2.0)
Bicarbonate: 29.8 mmol/L — ABNORMAL HIGH (ref 20.0–28.0)
Drawn by: 67031
O2 Saturation: 99.9 %
Patient temperature: 36.9
pCO2, Ven: 40 mmHg — ABNORMAL LOW (ref 44–60)
pH, Ven: 7.48 — ABNORMAL HIGH (ref 7.25–7.43)
pO2, Ven: 116 mmHg — ABNORMAL HIGH (ref 32–45)

## 2022-07-23 LAB — CBC WITH DIFFERENTIAL/PLATELET
Abs Immature Granulocytes: 0.01 10*3/uL (ref 0.00–0.07)
Basophils Absolute: 0 10*3/uL (ref 0.0–0.1)
Basophils Relative: 1 %
Eosinophils Absolute: 0.1 10*3/uL (ref 0.0–0.5)
Eosinophils Relative: 2 %
HCT: 34.6 % — ABNORMAL LOW (ref 36.0–46.0)
Hemoglobin: 11.2 g/dL — ABNORMAL LOW (ref 12.0–15.0)
Immature Granulocytes: 0 %
Lymphocytes Relative: 35 %
Lymphs Abs: 2.2 10*3/uL (ref 0.7–4.0)
MCH: 28.9 pg (ref 26.0–34.0)
MCHC: 32.4 g/dL (ref 30.0–36.0)
MCV: 89.4 fL (ref 80.0–100.0)
Monocytes Absolute: 0.4 10*3/uL (ref 0.1–1.0)
Monocytes Relative: 6 %
Neutro Abs: 3.6 10*3/uL (ref 1.7–7.7)
Neutrophils Relative %: 56 %
Platelets: 212 10*3/uL (ref 150–400)
RBC: 3.87 MIL/uL (ref 3.87–5.11)
RDW: 13 % (ref 11.5–15.5)
WBC: 6.4 10*3/uL (ref 4.0–10.5)
nRBC: 0 % (ref 0.0–0.2)

## 2022-07-23 LAB — COMPREHENSIVE METABOLIC PANEL
ALT: 13 U/L (ref 0–44)
AST: 20 U/L (ref 15–41)
Albumin: 3.3 g/dL — ABNORMAL LOW (ref 3.5–5.0)
Alkaline Phosphatase: 50 U/L (ref 38–126)
Anion gap: 7 (ref 5–15)
BUN: 12 mg/dL (ref 8–23)
CO2: 26 mmol/L (ref 22–32)
Calcium: 9.5 mg/dL (ref 8.9–10.3)
Chloride: 107 mmol/L (ref 98–111)
Creatinine, Ser: 0.76 mg/dL (ref 0.44–1.00)
GFR, Estimated: >60 mL/min (ref >60)
Glucose, Bld: 102 mg/dL — ABNORMAL HIGH (ref 70–99)
Potassium: 3.5 mmol/L (ref 3.5–5.1)
Sodium: 140 mmol/L (ref 135–145)
Total Bilirubin: 0.7 mg/dL (ref 0.3–1.2)
Total Protein: 6.4 g/dL — ABNORMAL LOW (ref 6.5–8.1)

## 2022-07-23 LAB — TSH: TSH: 6.178 u[IU]/mL — ABNORMAL HIGH (ref 0.350–4.500)

## 2022-07-23 LAB — MAGNESIUM: Magnesium: 1.9 mg/dL (ref 1.7–2.4)

## 2022-07-23 MED ORDER — APIXABAN 5 MG PO TABS
10.0000 mg | ORAL_TABLET | Freq: Two times a day (BID) | ORAL | Status: DC
  Administered 2022-07-23 – 2022-07-24 (×3): 10 mg via ORAL
  Filled 2022-07-23 (×3): qty 2

## 2022-07-23 MED ORDER — LEVOTHYROXINE SODIUM 50 MCG PO TABS
50.0000 ug | ORAL_TABLET | Freq: Every day | ORAL | Status: DC
  Administered 2022-07-24: 50 ug via ORAL
  Filled 2022-07-23: qty 1

## 2022-07-23 MED ORDER — LOSARTAN POTASSIUM 50 MG PO TABS
50.0000 mg | ORAL_TABLET | Freq: Every day | ORAL | Status: DC
  Administered 2022-07-23 – 2022-07-24 (×2): 50 mg via ORAL
  Filled 2022-07-23 (×2): qty 1

## 2022-07-23 MED ORDER — APIXABAN 5 MG PO TABS: 5.0000 mg | ORAL_TABLET | Freq: Two times a day (BID) | ORAL | Status: DC

## 2022-07-23 NOTE — Consult Note (Signed)
Neurology Consultation    Reason for Consult:LTM  CC: Decreased responsiveness  HISTORY OF PRESENT ILLNESS    History is obtained from: Patient, Chart Review  Carolyn Campbell is a 66 y.o. female  with a history of anxiety, recently diagnosed with memory disorder and started on rivastigmine as well as recent diagnosis of pulmonary embolism who presented to [facility] on 10/30.  Initially, she was complaining of shortness of breath at home, and due to her distress with her shortness of breath 911 was called.  There was no concern for neurological symptoms prior to EMS arrival.  It was with EMS in transport that she developed decreased responsiveness.  A code stroke was activated, and she was evaluated in the emergency department and seemed to have a significantly inconsistent exam.  Due to her persistent symptoms, MRI and EEG were obtained at [facility], which were both negative. She has been transferred to [facility] for LTM to assess intermittent spells of decreased responsiveness.   The patient denies headache, dizziness, visual changes, problems with swallowing or speech, focal muscle weakness, numbness or tingling of her extremities, abnormal movements, or other focal neurological deficits.  ROS: Full ROS was performed and is negative except as noted in the HPI.   PAST MEDICAL HISTORY    Past Medical History:  Past Medical History:  Diagnosis Date   Abdominal pain, epigastric    Allergic rhinitis    Arthritis    Asthma    Breast cancer ([place]) 2006   LT LUMPECTOMY   Clotting disorder ([place])    Cognitive deficits as late effect of cerebrovascular disease    COPD (chronic obstructive pulmonary disease) ([place])    COVID-19 virus infection 04/2021   Crohn's disease ([place]) 05/21/2015   FOLLOWED BY GI   Crohn's disease of both small and large intestine with rectal bleeding ([place]) 12/04/2014   Depression    Currently taking zoloft.   Dermatitis, eczematoid 05/21/2015   Dysarthria as late effect of  cerebrovascular disease    Dyspnea    Esophageal reflux    Fever blister 07/19/2018   Glaucoma    vitreous degeneration   History of echocardiogram    a. 05/2021 Echo: EF 60-65%, no rwma, nl RV fxn.   History of kidney stones    Hypercholesterolemia 01/18/2007   Hyperlipidemia    Hypertension    Hypothyroidism    Low back pain radiating to left leg 06/17/2021   Nonobstructive CAD (coronary artery disease)    a. 10/2012 Cath: diffuse minor irregs-->med rx; b. 08/2021 Cor CTA: Ca2+ = 55.6 (77th%'ile), LAD 25p, LCX <25p. No signif non-cardiac findings.   Occlusion, cerebral artery    NOS w/infarction   Osteoporosis    Overweight (BMI 25.0-29.9) 07/17/2022   Personal history of radiation therapy 2006   BREAST CA   Pulmonary embolism ([place])    a.04/2021 CTA Chest: Small filling defects are noted in upper and lower lobe branches of L PA-->acute PE-->eliquis. *PE occurred in context of COVID infxn.   Stroke [facility])    Ulcer     No family history on file. Family History  Problem Relation Age of Onset   Heart disease Mother    Heart attack Mother    Cerebrovascular Accident Father    Arthritis Father    Hypertension Father    Breast cancer Sister    Breast cancer Sister 69   Cancer Sister    Hypertension Other    Diabetes Other    Heart attack Other 20  Allergies:  No Known Allergies  Social History:   reports that she has never smoked. She has never used smokeless tobacco. She reports that she does not drink alcohol and does not use drugs.    Medications Medications Prior to Admission  Medication Sig Dispense Refill   [START ON 08/14/2022] apixaban (ELIQUIS) 5 MG TABS tablet Take 1 tablet (5 mg total) by mouth 2 (two) times daily. 60 tablet 4   Apixaban Starter Pack, 50m and 513m (ELIQUIS DVT/PE STARTER PACK) Take as directed on package: start with two-67m66mablets twice daily for 7 days. On day 8, switch to one-67mg71mblet twice daily. 1 each 0   latanoprost (XALATAN) 0.005  % ophthalmic solution Place 1 drop into both eyes at bedtime.      levothyroxine (SYNTHROID) 50 MCG tablet Take 1 tablet (50 mcg total) by mouth daily before breakfast. On empty stomach 90 tablet 0   losartan-hydrochlorothiazide (HYZAAR) 50-12.5 MG tablet Take 1 tablet by mouth daily. 90 tablet 3   moxifloxacin (VIGAMOX) 0.5 % ophthalmic solution Apply to eye.     Multiple Vitamin (MULTIVITAMIN) tablet Take 1 tablet by mouth daily.     neomycin-polymyxin b-dexamethasone (MAXITROL) 3.5-10000-0.1 OINT SMARTSIG:sparingly In Eye(s) Twice Daily     rosuvastatin (CRESTOR) 40 MG tablet Take 1 tablet (40 mg total) by mouth daily. 90 tablet 0   sertraline (ZOLOFT) 50 MG tablet Take 1 tablet (50 mg total) by mouth daily. 30 tablet 2   triamcinolone ointment (KENALOG) 0.1 % Apply to affected skin one to two times daily for up to two weeks (Patient taking differently: 1 Application 2 (two) times daily as needed. Apply to affected skin one to two times daily for up to two weeks) 30 g 0   valACYclovir (VALTREX) 1000 MG tablet Take 1 tablet (1,000 mg total) by mouth 2 (two) times daily. (Patient taking differently: Take 1,000 mg by mouth 2 (two) times daily as needed.) 180 tablet 3    EXAMINATION    Current vital signs:    07/23/2022    3:40 AM 07/23/2022   12:00 AM 07/22/2022    9:39 PM  Vitals with BMI  Systolic 157 592 [id number] 943astolic 67 73 63  Pulse   88    Examination:  GENERAL: Sleeping, in NAD HEENT: - Normocephalic and atraumatic, dry mm, no lymphadenopathy, no Thyromegally LUNGS - Clear to auscultation bilaterally CV - S1S2 RRR, equal pulses bilaterally. ABDOMEN - Soft, nontender, nondistended with normoactive BS Ext: warm, well perfused, intact peripheral pulses, no pedal edema  NEURO:  Mental Status: AA&Ox3  Language: speech is slow, with slight stuttering.  Intact naming, repetition, fluency, and comprehension.  Cranial Nerves:  II: PERRL. Visual fields full to confrontation.  III,  IV, VI: EOM in tact. Eyelids elevate symmetrically. Blinks to threat.  V: Sensation intact V1-3 symmetrically  VII: no facial asymmetry   VIII: hearing intact to voice IX, X: Palate elevates symmetrically. Phonation is normal.  XI:SQW:QVLDKCCQug 5/5 and symmetrical  XII: tongue is midline without fasciculations. Motor:  RUE: 5/5  LUE: 5/5 RLE: 3/5  LLE: 3/5 Tone: is normal and bulk is normal DTRs: 2+ and symmetrical throughout   Sensation- Intact to light touch, pin prick, vibratory sensation, and temperature bilaterally Coordination: FTN intact bilaterally, no ataxia in BLE, no abnormal movements  Gait- deferred  LABS   I have reviewed labs in epic and the results pertinent to this consultation are:   Lab Results  Component Value Date  LDLCALC 85 01/29/2021   Lab Results  Component Value Date   ALT 13 07/23/2022   AST 20 07/23/2022   ALKPHOS 50 07/23/2022   BILITOT 0.7 07/23/2022   No results found for: "HGBA1C" Lab Results  Component Value Date   WBC 6.4 07/23/2022   HGB 11.2 (L) 07/23/2022   HCT 34.6 (L) 07/23/2022   MCV 89.4 07/23/2022   PLT 212 07/23/2022   Lab Results  Component Value Date   VITAMINB12 1,014 10/17/2019   Lab Results  Component Value Date   FOLATE >20.0 10/17/2019   Lab Results  Component Value Date   NA 140 07/23/2022   K 3.5 07/23/2022   CL 107 07/23/2022   CO2 26 07/23/2022     DIAGNOSTIC IMAGING/PROCEDURES   I have reviewed the images obtained:, as below    CT-head 1. No hemorrhage or CT evidence of an acute infarct. 2. ASPECTS is 10  CTA head and neck  No emergent large vessel occlusion or proximal hemodynamically significant stenosis.  MRI brain 1. No acute intracranial abnormality. 2. Mild chronic microvascular ischemic change.  EEG  This essentially normal EEG is recorded in the waking and sleep state.  The excessive beta activity seen is likely secondary to medication effect.  There was no seizure or seizure  predisposition recorded on this study. Please note that lack of epileptiform activity on EEG does not preclude the possibility of epilepsy.   ASSESSMENT/PLAN    Assessment: 66 year old female who presents with decreased interactivity.  She has an inconsistent exam and has had normal imaging and EEG.  She was transferred to [facility] from [facility] for LTM. There is suspicion for conversion disorder vs seizures. Her symptoms have waxed and waned during her hospitalization. This morning she is awake and alert upon my entrance into her room. Of note, she did not have any improvement with ativan upon her arrival to [facility] ED.  - Exam reveals halting, hypophonic speech with slight stuttering. Overall quality is suggestive of conversion disorder versus secondary gain or stress-related symptoms. No jerking, twitching or automatisms seen.  - CT-head at [facility]: No hemorrhage or CT evidence of an acute infarct. - CTA head and neck at [facility]: No emergent large vessel occlusion or proximal hemodynamically significant stenosis. - MRI brain at [facility]: No acute intracranial abnormality. Mild chronic microvascular ischemic change. - Spot EEG at [facility]: This essentially normal EEG is recorded in the waking and sleep state.  The excessive beta activity seen is likely secondary to medication effect.  There was no seizure or seizure predisposition recorded on this study.  - LTM EEG at [facility]: Ongoing, with report pending  Impression: Conversion disorder vs pseudocatatonia vs atypical seizures  Recommendations: - LTM in place - Inpatient seizure precautions.  - Outpatient seizure precautions: Per [facility] statutes, patients with seizures are not allowed to drive until  they have been seizure-free for six months. Use caution when using heavy equipment or power tools. Avoid working on ladders or at heights. Take showers instead of baths. Ensure the water temperature is not too high on the home water heater. Do not go swimming alone.  When caring for infants or small children, sit down when holding, feeding, or changing them to minimize risk of injury to the child in the event you have a seizure. Also, Maintain good sleep hygiene. Avoid alcohol.  Addendum: - LTM EEG 07/23/2022 0310 to 1000: This study is within normal limits. No seizures or epileptiform discharges were seen throughout the  recording.    -- Patient seen and examined by NP/APP with MD.  Janine Ores, DNP, FNP-BC [facility] Pager: [phone]  I have seen and examined the patient. I have formulated the assessment and recommendations. 66 year old female with spells of decreased responsiveness. Exam reveals halting, hypophonic speech with slight stuttering. Overall quality is suggestive of conversion disorder versus secondary gain or stress-related symptoms. No jerking, twitching or automatisms seen. Overall clinical picture suggests pseudocatatonia versus pseudoseizures, but will need to investigate the possibility of atypical seizures further with LTM EEG. Recommendations as above.  Electronically signed: Dr. Kerney Elbe

## 2022-07-23 NOTE — Progress Notes (Signed)
PROGRESS NOTE    Carolyn Campbell  FIE:[id number] DOB: 1956-01-20 DOA: 07/22/2022 PCP: Delsa Grana, PA-C   Brief Narrative:  Carolyn Campbell is a 66 y.o. female with past medical history significant for history of CVA, major depressive disorder, REM sleep behavior disorder, prediabetes, multifactorial cognitive impairment, Crohn's disease, hypothyroidism, HTN, recent diagnosis of pulmonary embolism on Eliquis who presented to [facility] on 10/30 via EMS with complaints of shortness of breath.  While in route to the ED, EMS reports that patient became "catatonic" and confused with slow response.    Neurology/Psych consulted - recurrent episodes 11/1 and 11/2 - transferred to [facility] main campus for long term EEG to hopefully record one of these episodes to rule in/out seizure.   Assessment & Plan:   Principal Problem:   Acute encephalopathy  Acute metabolic encephalopathy  Catatonic state - rule out seizure - Complicated by history of REM sleep behavior disorder/multifactorial cognitive impairment  -Follows with neurology outpatient, Dr. Manuella Ghazi.   -Recently placed on rivastigmine for sleep behavior disorder.   -UA, UDS negative  -Neurology and psychiatry following -Stop rivastigmine -MRI brain, EEG unremarkable -Long term EEG pending -Reported event this afternoon - hopefully this was recorded on EEG   PE, POA -Eliquis   Sciatica -Follow up with Dr. Alba Destine for SI joint injection   Depression -Zoloft   Hypothyroidism -Synthroid   HTN -Losartan-HCTZ     DVT prophylaxis: Eliquis Code Status: Full Family Communication: Husband at bedside  Status is: Inpt  Dispo: The patient is from: Home              Anticipated d/c is to: TBD              Anticipated d/c date is: 24-48h              Patient currently NOT medically stable for discharge  Consultants:  Neurology  Procedures:  None  Antimicrobials:  None  Subjective: No acute issues/events  overnight, ROS limited secondary to mental status/baseline  Objective: Vitals:   07/22/22 2139 07/23/22 0000 07/23/22 0340  BP: (!) 166/63 (!) 159/73 (!) 157/67  Pulse: 88    Resp: 18 16 16   Temp: 98.2 F (36.8 C) 98.1 F (36.7 C) 98.5 F (36.9 C)  TempSrc: Oral Oral Oral  SpO2: 97% 100% 100%   No intake or output data in the 24 hours ending 07/23/22 0726 There were no vitals filed for this visit.  Examination:  General:  Pleasantly resting in bed, No acute distress. HEENT:  Normocephalic atraumatic.  Sclerae nonicteric, noninjected.  Extraocular movements intact bilaterally. Neck:  Without mass or deformity.  Trachea is midline. Lungs:  Clear to auscultate bilaterally without rhonchi, wheeze, or rales. Heart:  Regular rate and rhythm.  Without murmurs, rubs, or gallops. Abdomen:  Soft, nontender, nondistended.  Without guarding or rebound. Extremities: Without cyanosis, clubbing, edema, or obvious deformity. Vascular:  Dorsalis pedis and posterior tibial pulses palpable bilaterally. Skin:  Warm and dry, no erythema, no ulcerations.   Data Reviewed: I have personally reviewed following labs and imaging studies  CBC: Recent Labs  Lab 07/16/22 1620 07/17/22 0400 07/19/22 1412 07/20/22 0553 07/23/22 0327  WBC 9.9 8.0 5.8 5.7 6.4  NEUTROABS 6.1  --  3.6  --  3.6  HGB 12.2 11.5* 12.8 11.3* 11.2*  HCT 38.2 36.0 38.6 34.8* 34.6*  MCV 90.1 90.9 87.3 89.0 89.4  PLT 244 227 285 231 166   Basic Metabolic Panel: Recent Labs  Lab 07/16/22 1620 07/17/22 0400 07/19/22 1412 07/20/22 0553 07/23/22 0327  NA 139 140 140 140 140  K 3.0* 3.6 3.7 3.6 3.5  CL 103 105 100 104 107  CO2 28 30 28 28 26   GLUCOSE 103* 110* 125* 107* 102*  BUN 15 12 12 13 12   CREATININE 0.96 0.72 0.94 0.78 0.76  CALCIUM 9.8 9.3 10.6* 9.7 9.5  MG  --   --   --   --  1.9   GFR: Estimated Creatinine Clearance: 57.2 mL/min (by C-G formula based on SCr of 0.76 mg/dL). Liver Function Tests: Recent  Labs  Lab 07/17/22 0400 07/19/22 1412 07/20/22 0553 07/23/22 0327  AST 18 24 17 20   ALT 14 12 13 13   ALKPHOS 65 71 58 50  BILITOT 0.8 0.9 0.9 0.7  PROT 7.2 8.5* 7.0 6.4*  ALBUMIN 3.8 4.4 3.6 3.3*   No results for input(s): "LIPASE", "AMYLASE" in the last 168 hours. No results for input(s): "AMMONIA" in the last 168 hours. Coagulation Profile: Recent Labs  Lab 07/16/22 1622 07/19/22 1412  INR 1.0 1.8*   Cardiac Enzymes: No results for input(s): "CKTOTAL", "CKMB", "CKMBINDEX", "TROPONINI" in the last 168 hours. BNP (last 3 results) No results for input(s): "PROBNP" in the last 8760 hours. HbA1C: No results for input(s): "HGBA1C" in the last 72 hours. CBG: Recent Labs  Lab 07/19/22 1421  GLUCAP 101*   Lipid Profile: No results for input(s): "CHOL", "HDL", "LDLCALC", "TRIG", "CHOLHDL", "LDLDIRECT" in the last 72 hours. Thyroid Function Tests: No results for input(s): "TSH", "T4TOTAL", "FREET4", "T3FREE", "THYROIDAB" in the last 72 hours. Anemia Panel: No results for input(s): "VITAMINB12", "FOLATE", "FERRITIN", "TIBC", "IRON", "RETICCTPCT" in the last 72 hours. Sepsis Labs: No results for input(s): "PROCALCITON", "LATICACIDVEN" in the last 168 hours.  No results found for this or any previous visit (from the past 240 hour(s)).   Radiology Studies: No results found.  Scheduled Meds: Continuous Infusions:   LOS: 1 day   Time spent: 47mn  Muriel Wilber C Catherine Oak, DO [facility]  If 7PM-7AM, please contact night-coverage [email or web address]  07/23/2022, 7:26 AM

## 2022-07-23 NOTE — Progress Notes (Signed)
LTM maint complete - no skin breakdown [facility] monitored, Event button test confirmed by [facility]. ? ?

## 2022-07-23 NOTE — TOC Initial Note (Signed)
Transition of Care [facility]) - Initial/Assessment Note    Patient Details  Name: Carolyn Campbell MRN: [id number] Date of Birth: 1956-07-31  Transition of Care [facility]) CM/SW Contact:    Pollie Friar, RN Phone Number: 07/23/2022, 2:26 PM  Clinical Narrative:                 Pt is from home with spouse. He is home most of the time. No DME at home.  Pt drives self but spouse can provide transportation as needed.  Pt manages her own medications without any issues.  TOC following for d/c needs.   Expected Discharge Plan: Home/Self Care Barriers to Discharge: Continued Medical Work up   Patient Goals and CMS Choice        Expected Discharge Plan and Services Expected Discharge Plan: Home/Self Care   Discharge Planning Services: CM Consult   Living arrangements for the past 2 months: Single Family Home                                      Prior Living Arrangements/Services Living arrangements for the past 2 months: Single Family Home Lives with:: Spouse Patient language and need for interpreter reviewed:: Yes Do you feel safe going back to the place where you live?: Yes      Need for Family Participation in Patient Care: Yes (Comment) Care giver support system in place?: Yes (comment)   Criminal Activity/Legal Involvement Pertinent to Current Situation/Hospitalization: No - Comment as needed  Activities of Daily Living      Permission Sought/Granted                  Emotional Assessment Appearance:: Appears stated age Attitude/Demeanor/Rapport: Engaged Affect (typically observed): Accepting Orientation: : Oriented to Self, Oriented to Place, Oriented to  Time, Oriented to Situation   Psych Involvement: No (comment)  Admission diagnosis:  Acute encephalopathy [G93.40] Patient Active Problem List   Diagnosis Date Noted   Acute metabolic encephalopathy 89/21/1941   Acute encephalopathy 07/22/2022   Pulmonary embolism ([place]) 07/16/2022   Pain in limb  11/10/2021   Nonobstructive CAD (coronary artery disease) 08/06/2021   Other long term (current) drug therapy 01/22/2021   Varicose veins of both lower extremities with pain 07/19/2018   Arthritis of right hand 12/21/2017   Immunosuppressed status ([place]) 12/21/2017   Major depression in remission ([place]) 12/21/2017   Fibrocystic breast changes 06/22/2017   Osteopenia 10/18/2016   Atherosclerosis of aorta ([place]) 12/19/2015   Vitreous degeneration 05/21/2015   Glaucoma associated with chamber angle anomalies 05/21/2015   H/O malignant neoplasm of breast 05/21/2015   Crohn's disease of both small and large intestine with rectal bleeding ([place]) 12/04/2014   Solitary pulmonary nodule 11/07/2012   Cognitive deficits as late effect of cerebrovascular disease 01/20/2010   CVA, old, hemiparesis ([place]) 04/28/2009   Acquired hypothyroidism 06/12/2008   HLD (hyperlipidemia) 01/18/2007   Essential hypertension 01/18/2007   PCP:  Delsa Grana, PA-C Pharmacy:   [facility] [place], [place] - [address] [facility] [address] [place] [address] Phone: [phone] Fax: [phone]  [place] [address] ([address]), [place] [address] [place]) [place] [address] Phone: [phone] Fax: [phone]     Social Determinants of Health (SDOH) Interventions    Readmission Risk Interventions     No data to display

## 2022-07-23 NOTE — Progress Notes (Signed)
LTM EEG hooked up and running - no initial skin breakdown - push button tested - [facility] monitoring.

## 2022-07-23 NOTE — Procedures (Signed)
Patient Name: Carolyn Campbell  MRN: [id number]  Epilepsy Attending: Lora Havens  Referring Physician/Provider: Amie Portland, MD  Duration: 07/23/2022 0310 to 07/24/2022 0310  Patient history: 66yo F with ams. EEG to evaluate for seizure  Level of alertness: Awake, asleep  AEDs during EEG study: None  Technical aspects: This EEG study was done with scalp electrodes positioned according to the 10-20 International system of electrode placement. Electrical activity was reviewed with band pass filter of 1-70Hz , sensitivity of 7 uV/mm, display speed of 71m/sec with a 60Hz  notched filter applied as appropriate. EEG data were recorded continuously and digitally stored.  Video monitoring was available and reviewed as appropriate.  Description: The posterior dominant rhythm consists of 8-9 Hz activity of moderate voltage (25-35 uV) seen predominantly in posterior head regions, symmetric and reactive to eye opening and eye closing. Sleep was characterized by vertex waves, sleep spindles (12 to 14 Hz), maximal frontocentral region. Hyperventilation and photic stimulation were not performed.     At 1156 on 07/23/2022, patient's family notified RN that patient was zoning out. Concomitant eeg before, during and after the event didn't show any eeg change to suggest seizure.   IMPRESSION: This study is within normal limits. No seizures or epileptiform discharges were seen throughout the recording.  At 1156 on 07/23/2022, patient's family notified RN that patient was zoning out.. Concomitant eeg before, during and after the event didn't show any eeg change to suggest seizure.  This was most likely NOT an epileptic event.   Natasia Sanko [name]

## 2022-07-23 NOTE — Progress Notes (Signed)
Pt is currently having that episode of unresponsiveness, eyes are open and she blinks not responding to verbal or painful stimuli. Vitals are stable. Neuro notified via eeg monitor. Awaiting for new orders. [facility] notified as well, orders to wait on neuro's recommendations.

## 2022-07-24 MED ORDER — LOSARTAN POTASSIUM 50 MG PO TABS: 50.0000 mg | ORAL_TABLET | Freq: Every day | ORAL | 1 refills | Status: DC

## 2022-07-24 MED ORDER — APIXABAN 5 MG PO TABS: 5.0000 mg | ORAL_TABLET | Freq: Two times a day (BID) | ORAL | 1 refills | Status: DC

## 2022-07-24 NOTE — Discharge Summary (Signed)
Physician Discharge Summary  Carolyn Campbell JME:[id number] DOB: [birth year] DOA: 07/22/2022  PCP: Delsa Grana, PA-C  Admit date: 07/22/2022 Discharge date: 07/24/2022  Admitted From: Home Disposition: Home  Recommendations for Outpatient Follow-up:  Follow up with PCP in 1-2 weeks Follow-up with psychiatry as scheduled in the next 2 weeks  Home Health: PT  Discharge Condition: Stable CODE STATUS: Full Diet recommendation: Regular diet as tolerated  Brief/Interim Summary: Carolyn Campbell is a 66 y.o. female with past medical history significant for history of CVA, major depressive disorder, REM sleep behavior disorder, prediabetes, multifactorial cognitive impairment, Crohn's disease, hypothyroidism, HTN, recent diagnosis of pulmonary embolism on Eliquis who presented to [facility] on 10/30 via EMS with complaints of shortness of breath.  While in route to the ED, EMS reports that patient became "catatonic" and confused with slow response.     Neurology/Psych consulted - recurrent episodes 11/1 and 11/2 - transferred to [facility] main campus for long term EEG to hopefully record one of these episodes to rule in/out seizure.  Patient had notable event while on continuous EEG without any notable epilepsy/epileptiform activity there is ongoing concern this is a behavioral/conversion disorder issue.  Recommend close follow-up with psychiatry in the outpatient setting.  Patient otherwise stable and agreeable for discharge home, lengthy conversation with family at bedside about patient's need for improved activity and sleep hygiene as this may be contributing to patient's symptoms.  Discharge Diagnoses:  Principal Problem:   Acute encephalopathy Active Problems:   Pulmonary embolism (HCC)   Essential hypertension   HLD (hyperlipidemia)   Acquired hypothyroidism    Discharge Instructions   Allergies as of 07/24/2022   No Known Allergies      Medication List     STOP  taking these medications    losartan-hydrochlorothiazide 50-12.5 MG tablet Commonly known as: HYZAAR   moxifloxacin 0.5 % ophthalmic solution Commonly known as: VIGAMOX       TAKE these medications    apixaban 5 MG Tabs tablet Commonly known as: Eliquis Take 1 tablet (5 mg total) by mouth 2 (two) times daily. Start taking on: August 14, 2022 What changed: Another medication with the same name was removed. Continue taking this medication, and follow the directions you see here.   brimonidine 0.2 % ophthalmic solution Commonly known as: ALPHAGAN Place 1 drop into both eyes in the morning and at bedtime.   latanoprost 0.005 % ophthalmic solution Commonly known as: XALATAN Place 1 drop into both eyes at bedtime.   levothyroxine 50 MCG tablet Commonly known as: SYNTHROID Take 1 tablet (50 mcg total) by mouth daily before breakfast. On empty stomach   losartan 50 MG tablet Commonly known as: COZAAR Take 1 tablet (50 mg total) by mouth daily. Start taking on: July 25, 2022   multivitamin tablet Take 1 tablet by mouth daily.   rosuvastatin 40 MG tablet Commonly known as: CRESTOR Take 1 tablet (40 mg total) by mouth daily. What changed: when to take this   sertraline 50 MG tablet Commonly known as: ZOLOFT Take 1 tablet (50 mg total) by mouth daily.   triamcinolone ointment 0.1 % Commonly known as: KENALOG Apply to affected skin one to two times daily for up to two weeks What changed:  how much to take how to take this when to take this reasons to take this additional instructions   valACYclovir 1000 MG tablet Commonly known as: VALTREX Take 1 tablet (1,000 mg total) by mouth 2 (two) times  daily. What changed:  when to take this reasons to take this        No Known Allergies  Consultations: Neuro, psychiatry   Procedures/Studies: Overnight EEG with video  Result Date: 07/23/2022 Lora Havens, MD     07/24/2022  9:23 AM Patient Name: Carolyn Campbell MRN: [id number] Epilepsy Attending: Lora Havens Referring Physician/Provider: Amie Portland, MD Duration: 07/23/2022 0310 to 07/24/2022 0310 Patient history: 66yo F with ams. EEG to evaluate for seizure Level of alertness: Awake, asleep AEDs during EEG study: None Technical aspects: This EEG study was done with scalp electrodes positioned according to the 10-20 International system of electrode placement. Electrical activity was reviewed with band pass filter of 1-70Hz , sensitivity of 7 uV/mm, display speed of 5m/sec with a 60Hz  notched filter applied as appropriate. EEG data were recorded continuously and digitally stored.  Video monitoring was available and reviewed as appropriate. Description: The posterior dominant rhythm consists of 8-9 Hz activity of moderate voltage (25-35 uV) seen predominantly in posterior head regions, symmetric and reactive to eye opening and eye closing. Sleep was characterized by vertex waves, sleep spindles (12 to 14 Hz), maximal frontocentral region. Hyperventilation and photic stimulation were not performed.   At 1156 on 07/23/2022, patient's family notified RN that patient was zoning out. Concomitant eeg before, during and after the event didn't show any eeg change to suggest seizure. IMPRESSION: This study is within normal limits. No seizures or epileptiform discharges were seen throughout the recording. At 1156 on 07/23/2022, patient's family notified RN that patient was zoning out.. Concomitant eeg before, during and after the event didn't show any eeg change to suggest seizure.  This was most likely NOT an epileptic event. PLora Havens  EEG adult  Result Date: 07/20/2022 KGreta Doom MD     07/20/2022  2:15 PM History: 66year old female being evaluated for decreased responsiveness Sedation: No current sedation, did receive Ativan yesterday Technique: This EEG was acquired with electrodes placed according to the International 10-20 electrode system  (including Fp1, Fp2, F3, F4, C3, C4, P3, P4, O1, O2, T3, T4, T5, T6, A1, A2, Fz, Cz, Pz). The following electrodes were missing or displaced: none. Background: The background is dominated by excessive frontally predominant beta activity.  This does give a sharply contoured appearance to the background, but no clear epileptiform discharges were seen.  There is a posterior dominant rhythm of 10 Hz which is seen at times and attenuates with eye opening.  With sleep, there are bilaterally symmetrical sleep structures observed.  No clearly epileptiform activity was seen. Photic stimulation: Physiologic driving is not performed EEG Abnormalities: Excessive beta activity Clinical Interpretation: This essentially normal EEG is recorded in the waking and sleep state.  The excessive beta activity seen is likely secondary to medication effect.  There was no seizure or seizure predisposition recorded on this study. Please note that lack of epileptiform activity on EEG does not preclude the possibility of epilepsy. MRoland Rack MD [facility] [phone] 7pm- 7am, please page neurology on call as listed in [place]   MR BRAIN WO CONTRAST  Result Date: 07/19/2022 CLINICAL DATA:  Altered mental status. EXAM: MRI HEAD WITHOUT CONTRAST TECHNIQUE: Multiplanar, multiecho pulse sequences of the brain and surrounding structures were obtained without intravenous contrast. COMPARISON:  None Available. FINDINGS: Brain: No acute infarction, hemorrhage, hydrocephalus, extra-axial collection or mass lesion. Incidentally noted is a partially empty sella. Sequela of mild chronic microvascular ischemic change. C Vascular: Normal flow voids.  Skull and upper cervical spine: Normal marrow signal. Sinuses/Orbits: Bilateral lens replacement. Other: None IMPRESSION: 1. No acute intracranial abnormality. 2. Mild chronic microvascular ischemic change. Electronically Signed   By: Marin Roberts M.D.   On: 07/19/2022 18:22   US  Venous Img Lower Bilateral  Result Date: 07/19/2022 CLINICAL DATA:  Lower extremity swelling EXAM: BILATERAL LOWER EXTREMITY VENOUS DOPPLER ULTRASOUND TECHNIQUE: Gray-scale sonography with graded compression, as well as color Doppler and duplex ultrasound were performed to evaluate the lower extremity deep venous systems from the level of the common femoral vein and including the common femoral, femoral, profunda femoral, popliteal and calf veins including the posterior tibial, peroneal and gastrocnemius veins when visible. The superficial great saphenous vein was also interrogated. Spectral Doppler was utilized to evaluate flow at rest and with distal augmentation maneuvers in the common femoral, femoral and popliteal veins. COMPARISON:  None Available. FINDINGS: RIGHT LOWER EXTREMITY VENOUS Normal compressibility of the RIGHT common femoral, superficial femoral, and popliteal veins, as well as the visualized calf veins. Visualized portions of profunda femoral vein and great saphenous vein unremarkable. No filling defects to suggest DVT on grayscale or color Doppler imaging. Doppler waveforms show normal direction of venous flow, normal respiratory plasticity and response to augmentation. OTHER No evidence of superficial thrombophlebitis or abnormal fluid collection. Limitations: none LEFT LOWER EXTREMITY VENOUS Normal compressibility of the LEFT common femoral, superficial femoral, and popliteal veins, as well as the visualized calf veins. Visualized portions of profunda femoral vein and great saphenous vein unremarkable. No filling defects to suggest DVT on grayscale or color Doppler imaging. Doppler waveforms show normal direction of venous flow, normal respiratory plasticity and response to augmentation. OTHER No evidence of superficial thrombophlebitis or abnormal fluid collection. Limitations: none IMPRESSION: No evidence of femoropopliteal DVT or superficial thrombophlebitis within either lower extremity.  Michaelle Birks, MD [facility] Radiology Electronically Signed   By: Michaelle Birks M.D.   On: 07/19/2022 17:30   CT Angio Chest Pulmonary Embolism (PE) W or WO Contrast  Result Date: 07/19/2022 CLINICAL DATA:  Shortness of breath EXAM: CT ANGIOGRAPHY CHEST WITH CONTRAST TECHNIQUE: Multidetector CT imaging of the chest was performed using the standard protocol during bolus administration of intravenous contrast. Multiplanar CT image reconstructions and MIPs were obtained to evaluate the vascular anatomy. RADIATION DOSE REDUCTION: This exam was performed according to the departmental dose-optimization program which includes automated exposure control, adjustment of the mA and/or kV according to patient size and/or use of iterative reconstruction technique. CONTRAST:  125m OMNIPAQUE IOHEXOL 350 MG/ML SOLN COMPARISON:  CTA Chest 07/16/22 FINDINGS: Cardiovascular: Assessment for the presence of PE is markedly limited due to a combination of respiratory motion artifact and poor opacification of the pulmonary arteries. Of note, the thrombus seen on recent prior CTA chest on 07/16/22 is not visualized on this exam. No evidence of pulmonary embolism in the main pulmonary arteries. Normal heart size. No pericardial effusion. Mediastinum/Nodes: No enlarged mediastinal, hilar, or axillary lymph nodes. Thyroid gland, trachea, and esophagus demonstrate no significant findings. Lungs/Pleura: No suspicious pulmonary nodules are visualized. Note that assessment is limited due to respiratory motion artifact. No pleural effusion or pneumothorax. Upper Abdomen: No acute abnormality. Musculoskeletal: No chest wall abnormality. No acute or significant osseous findings. Small intramuscular lipoma along the anterior chest wall (series 11, image 799). Review of the MIP images confirms the above findings. IMPRESSION: Assessment for the presence of PE is markedly limited due to a combination  of respiratory motion  artifact and poor opacification of the pulmonary arteries. Of note, the thrombus seen on recent prior CTA chest on 07/16/22 is not visualized on this exam. No evidence of pulmonary embolism in the main pulmonary arteries. No CT evidence of right heart strain. Electronically Signed   By: Marin Roberts M.D.   On: 07/19/2022 15:24   CT ANGIO HEAD NECK W WO CM (CODE STROKE)  Result Date: 07/19/2022 CLINICAL DATA:  Neuro deficit, acute, stroke suspected EXAM: CT ANGIOGRAPHY HEAD AND NECK TECHNIQUE: Multidetector CT imaging of the head and neck was performed using the standard protocol during bolus administration of intravenous contrast. Multiplanar CT image reconstructions and MIPs were obtained to evaluate the vascular anatomy. Carotid stenosis measurements (when applicable) are obtained utilizing NASCET criteria, using the distal internal carotid diameter as the denominator. RADIATION DOSE REDUCTION: This exam was performed according to the departmental dose-optimization program which includes automated exposure control, adjustment of the mA and/or kV according to patient size and/or use of iterative reconstruction technique. CONTRAST:  150m OMNIPAQUE IOHEXOL 350 MG/ML SOLN COMPARISON:  CT head from the same day. FINDINGS: CTA NECK FINDINGS Aortic arch: Great vessel origins are patent without significant stenosis. Right carotid system: No evidence of dissection, stenosis (50% or greater), or occlusion. Left carotid system: No evidence of dissection, stenosis (50% or greater), or occlusion. Vertebral arteries: Codominant. No evidence of dissection, stenosis (50% or greater), or occlusion. Skeleton: No acute findings. Other neck: No acute findings. Upper chest: Visualized lung apices are clear. Review of the MIP images confirms the above findings CTA HEAD FINDINGS Anterior circulation: Bilateral intracranial ICAs are patent with mild narrowing due to calcific atherosclerosis. Bilateral MCAs  and ACAs are patent without proximal hemodynamically significantly stenosis. Posterior circulation: Bilateral intradural vertebral arteries, basilar artery and bilateral posterior cerebral arteries are patent without proximal hemodynamically significant stenosis. Venous sinuses: As permitted by contrast timing, patent. Review of the MIP images confirms the above findings IMPRESSION: No emergent large vessel occlusion or proximal hemodynamically significant stenosis. Electronically Signed   By: FMargaretha SheffieldM.D.   On: 07/19/2022 15:04   CT HEAD CODE STROKE WO CONTRAST`  Result Date: 07/19/2022 CLINICAL DATA:  Code stroke.  [place] EXAM: CT HEAD WITHOUT CONTRAST TECHNIQUE: Contiguous axial images were obtained from the base of the skull through the vertex without intravenous contrast. RADIATION DOSE REDUCTION: This exam was performed according to the departmental dose-optimization program which includes automated exposure control, adjustment of the mA and/or kV according to patient size and/or use of iterative reconstruction technique. COMPARISON:  None Available. FINDINGS: Brain: No evidence of acute infarction, hemorrhage, hydrocephalus, extra-axial collection or mass lesion/mass effect. Vascular: No hyperdense vessel or unexpected calcification. Skull: Normal. Negative for fracture or focal lesion. Sinuses/Orbits: Bilateral lens replacements. Other: None. ASPECTS ([place]roke Program Early CT Score): 10 IMPRESSION: 1. No hemorrhage or CT evidence of an acute infarct. 2. ASPECTS is 10 Discussed with Dr. KLeonel Ramsayon 07/19/22 at 2:45 PM via telephone. Electronically Signed   By: HMarin RobertsM.D.   On: 07/19/2022 14:51   CT Angio Chest PE W and/or Wo Contrast  Result Date: 07/16/2022 CLINICAL DATA:  Chest pain. EXAM: CT ANGIOGRAPHY CHEST WITH CONTRAST TECHNIQUE: Multidetector CT imaging of the chest was performed using the standard protocol during bolus administration of intravenous contrast.  Multiplanar CT image reconstructions and MIPs were obtained to evaluate the vascular anatomy. RADIATION DOSE REDUCTION: This exam was performed according to the departmental dose-optimization program which includes automated exposure control, adjustment of  the mA and/or kV according to patient size and/or use of iterative reconstruction technique. CONTRAST:  36m OMNIPAQUE IOHEXOL 350 MG/ML SOLN COMPARISON:  August 20, 2021 FINDINGS: Cardiovascular: There is moderate to marked severity calcification of the thoracic aorta, without evidence of aortic aneurysm. A small amount of intraluminal low attenuation is seen within a lower lobe branch of the right pulmonary artery (axial CT image 41, CT series 4). Normal heart size. Mild right heart strain is seen (RV/LV ratio of 1.04). No pericardial effusion. Mediastinum/Nodes: No enlarged mediastinal, hilar, or axillary lymph nodes. Thyroid gland, trachea, and esophagus demonstrate no significant findings. Lungs/Pleura: Mild atelectasis is seen within the posterior aspect of the bilateral lower lobes. There is no evidence of acute infiltrate, pleural effusion or pneumothorax. Upper Abdomen: No acute abnormality. Musculoskeletal: No chest wall abnormality. No acute or significant osseous findings. Review of the MIP images confirms the above findings. IMPRESSION: 1. Small amount of pulmonary embolism within a lower lobe branch of the right pulmonary artery. 2. Mild right heart strain (RV/LV ratio of 1.04). 3. Mild bilateral lower lobe atelectasis. 4. Aortic atherosclerosis. Aortic Atherosclerosis (ICD10-I70.0). Electronically Signed   By: TVirgina NorfolkM.D.   On: 07/16/2022 21:16   DG Chest 2 View  Result Date: 07/16/2022 CLINICAL DATA:  Chest pain EXAM: CHEST - 2 VIEW COMPARISON:  Chest x-ray 07/01/2021 FINDINGS: The heart size and mediastinal contours are within normal limits. Both lungs are clear. The visualized skeletal structures are unremarkable. IMPRESSION: No  active cardiopulmonary disease. Electronically Signed   By: ARonney AstersM.D.   On: 07/16/2022 16:42   MM 3D SCREEN BREAST BILATERAL  Result Date: 06/25/2022 CLINICAL DATA:  Screening. EXAM: DIGITAL SCREENING BILATERAL MAMMOGRAM WITH TOMOSYNTHESIS AND CAD TECHNIQUE: Bilateral screening digital craniocaudal and mediolateral oblique mammograms were obtained. Bilateral screening digital breast tomosynthesis was performed. The images were evaluated with computer-aided detection. COMPARISON:  Previous exam(s). ACR Breast Density Category c: The breast tissue is heterogeneously dense, which may obscure small masses. FINDINGS: There are no findings suspicious for malignancy. IMPRESSION: No mammographic evidence of malignancy. A result letter of this screening mammogram will be mailed directly to the patient. RECOMMENDATION: Screening mammogram in one year. (Code:[id number]-B-01Y) BI-RADS CATEGORY  1: Negative. Electronically Signed   By: TZerita BoersM.D.   On: 06/25/2022 13:57     Subjective: No acute issues or events overnight no further episodes reported per family patient   Discharge Exam: Vitals:   07/24/22 0354 07/24/22 0826  BP: 135/71 136/65  Pulse: 66 63  Resp:  20  Temp: 98.1 F (36.7 C) (!) 97.5 F (36.4 C)  SpO2: 98% 100%   Vitals:   07/23/22 1608 07/23/22 2300 07/24/22 0354 07/24/22 0826  BP: (!) 149/62 (!) 136/54 135/71 136/65  Pulse: 75 65 66 63  Resp: 19 15  20   Temp: 98.2 F (36.8 C) 98.2 F (36.8 C) 98.1 F (36.7 C) (!) 97.5 F (36.4 C)  TempSrc: Oral Axillary Oral Oral  SpO2: 97% 96% 98% 100%    General: Pt is alert, awake, not in acute distress Cardiovascular: RRR, S1/S2 +, no rubs, no gallops Respiratory: CTA bilaterally, no wheezing, no rhonchi Abdominal: Soft, NT, ND, bowel sounds + Extremities: no edema, no cyanosis  The results of significant diagnostics from this hospitalization (including imaging, microbiology, ancillary and laboratory) are listed below for  reference.     Microbiology: No results found for this or any previous visit (from the past 240 hour(s)).   Labs: BNP (last  3 results) Recent Labs    07/19/22 1630  BNP 25.4   Basic Metabolic Panel: Recent Labs  Lab 07/19/22 1412 07/20/22 0553 07/23/22 0327  NA 140 140 140  K 3.7 3.6 3.5  CL 100 104 107  CO2 28 28 26   GLUCOSE 125* 107* 102*  BUN 12 13 12   CREATININE 0.94 0.78 0.76  CALCIUM 10.6* 9.7 9.5  MG  --   --  1.9   Liver Function Tests: Recent Labs  Lab 07/19/22 1412 07/20/22 0553 07/23/22 0327  AST 24 17 20   ALT 12 13 13   ALKPHOS 71 58 50  BILITOT 0.9 0.9 0.7  PROT 8.5* 7.0 6.4*  ALBUMIN 4.4 3.6 3.3*   No results for input(s): "LIPASE", "AMYLASE" in the last 168 hours. No results for input(s): "AMMONIA" in the last 168 hours. CBC: Recent Labs  Lab 07/19/22 1412 07/20/22 0553 07/23/22 0327  WBC 5.8 5.7 6.4  NEUTROABS 3.6  --  3.6  HGB 12.8 11.3* 11.2*  HCT 38.6 34.8* 34.6*  MCV 87.3 89.0 89.4  PLT 285 231 212   Cardiac Enzymes: No results for input(s): "CKTOTAL", "CKMB", "CKMBINDEX", "TROPONINI" in the last 168 hours. BNP: Invalid input(s): "POCBNP" CBG: Recent Labs  Lab 07/19/22 1421  GLUCAP 101*   D-Dimer No results for input(s): "DDIMER" in the last 72 hours. Hgb A1c No results for input(s): "HGBA1C" in the last 72 hours. Lipid Profile No results for input(s): "CHOL", "HDL", "LDLCALC", "TRIG", "CHOLHDL", "LDLDIRECT" in the last 72 hours. Thyroid function studies Recent Labs    07/23/22 0327  TSH 6.178*   Anemia work up No results for input(s): "VITAMINB12", "FOLATE", "FERRITIN", "TIBC", "IRON", "RETICCTPCT" in the last 72 hours. Urinalysis    Component Value Date/Time   COLORURINE YELLOW (A) 07/19/2022 2035   APPEARANCEUR CLEAR (A) 07/19/2022 2035   APPEARANCEUR Clear 11/12/2013 1420   LABSPEC >1.046 (H) 07/19/2022 2035   LABSPEC 1.026 11/12/2013 1420   PHURINE 8.0 07/19/2022 2035   GLUCOSEU NEGATIVE 07/19/2022 2035    GLUCOSEU Negative 11/12/2013 1420   HGBUR NEGATIVE 07/19/2022 2035   BILIRUBINUR NEGATIVE 07/19/2022 2035   BILIRUBINUR negative 11/08/2018 1453   BILIRUBINUR Negative 11/12/2013 1420   [place] 07/19/2022 2035   PROTEINUR NEGATIVE 07/19/2022 2035   UROBILINOGEN negative (A) 11/08/2018 1453   NITRITE NEGATIVE 07/19/2022 2035   LEUKOCYTESUR NEGATIVE 07/19/2022 2035   LEUKOCYTESUR Negative 11/12/2013 1420   Sepsis Labs Recent Labs  Lab 07/19/22 1412 07/20/22 0553 07/23/22 0327  WBC 5.8 5.7 6.4   Microbiology No results found for this or any previous visit (from the past 240 hour(s)).   Time coordinating discharge: Over 30 minutes  SIGNED:   Little Ishikawa, DO [facility] 07/24/2022, 12:03 PM Pager   If 7PM-7AM, please contact night-coverage [email or web address]

## 2022-07-24 NOTE — Evaluation (Signed)
Physical Therapy Evaluation Patient Details Name: Carolyn Campbell MRN: [id number] DOB: Apr 04, 1956 Today's Date: 07/24/2022  History of Present Illness  66 y.o. female with past medical history significant for history of CVA, major depressive disorder, REM sleep behavior disorder, prediabetes, multifactorial cognitive impairment, Crohn's disease, hypothyroidism, HTN, recent diagnosis of pulmonary embolism on Eliquis who presented to [facility] on 10/30 via EMS with complaints of shortness of breath. Transfered to [facility] and admitted 07/22/22  Clinical Impression  Pt admitted with above diagnosis. Ambulates without loss of balance while using RW, unsteady without AD, family reports baseline from CVA years ago. Pt had episode of freezing where she became unresponsive while walking in the hallway. Unable to respond or move, no syncope, however when chair was provided, patient was able to sit down on command. HHPT to assist with balance and gait progression. Will continue to follow until d/c. Family education provided.        Recommendations for follow up therapy are one component of a multi-disciplinary discharge planning process, led by the attending physician.  Recommendations may be updated based on patient status, additional functional criteria and insurance authorization.  Follow Up Recommendations Home health PT      Assistance Recommended at Discharge Frequent or constant Supervision/Assistance  Patient can return home with the following  A little help with walking and/or transfers;A little help with bathing/dressing/bathroom;Assistance with cooking/housework;Direct supervision/assist for medications management;Direct supervision/assist for financial management;Assist for transportation;Help with stairs or ramp for entrance    Equipment Recommendations Other (comment) (Tub bench)  Recommendations for Other Services       Functional Status Assessment Patient has had a recent  decline in their functional status and demonstrates the ability to make significant improvements in function in a reasonable and predictable amount of time.     Precautions / Restrictions Precautions Precautions: Fall Restrictions Weight Bearing Restrictions: No      Mobility  Bed Mobility               General bed mobility comments: in recliner    Transfers Overall transfer level: Needs assistance Equipment used: Rolling walker (2 wheels) Transfers: Sit to/from Stand Sit to Stand: Min guard           General transfer comment: min guard for safety, slow to rise.    Ambulation/Gait Ambulation/Gait assistance: Min guard, Min assist Gait Distance (Feet): 75 Feet Assistive device: Rolling walker (2 wheels), 1 person hand held assist Gait Pattern/deviations: Step-through pattern, Staggering left   Gait velocity interpretation: <1.31 ft/sec, indicative of household ambulator   General Gait Details: Min guard for safety. Using RW for support; HHA in room without AD showing some instability with staggering towards Lt but able to self correct. Pt had freezing episode in hallway, no motor tone loss, but became unresponsive for about 5 minutes, required chair to have pt sit. BP 181/85 hr 83  Stairs            Wheelchair Mobility    Modified Rankin (Stroke Patients Only)       Balance Overall balance assessment: Mild deficits observed, not formally tested Sitting-balance support: No upper extremity supported, Feet supported Sitting balance-Leahy Scale: Good     Standing balance support: No upper extremity supported Standing balance-Leahy Scale: Fair                               Pertinent Vitals/Pain Pain Assessment Pain Assessment: 0-10 Pain  Score: 5  Pain Location: LLE Pain Descriptors / Indicators: Aching Pain Intervention(s): Monitored during session, Repositioned    Home Living Family/patient expects to be discharged to:: Private  residence Living Arrangements: Spouse/significant other Available Help at Discharge: Family Type of Home: House Home Access: Stairs to enter Entrance Stairs-Rails: Right;Left;Can reach both Entrance Stairs-Number of Steps: 4   Home Layout: Multi-level;Laundry or work area in basement;Able to live on main level with bedroom/bathroom Home Equipment: Kasandra Knudsen - single point      Prior Function Prior Level of Function : Independent/Modified Independent;Driving             Mobility Comments: Pt ambulates without AD except PRN use of SPC when LLE feeling weak, driving, no falls.       Hand Dominance        Extremity/Trunk Assessment   Upper Extremity Assessment Upper Extremity Assessment: Defer to OT evaluation    Lower Extremity Assessment Lower Extremity Assessment: Generalized weakness       Communication   Communication: Expressive difficulties  Cognition Arousal/Alertness: Awake/alert Behavior During Therapy: WFL for tasks assessed/performed Overall Cognitive Status: Within Functional Limits for tasks assessed                                          General Comments General comments (skin integrity, edema, etc.): discussed safety, home care, resources, equipment with family.    Exercises     Assessment/Plan    PT Assessment Patient needs continued PT services  PT Problem List Decreased strength;Decreased mobility;Decreased coordination;Decreased activity tolerance;Decreased balance;Decreased cognition       PT Treatment Interventions DME instruction;Gait training;Stair training;Functional mobility training;Therapeutic activities;Therapeutic exercise;Balance training;Neuromuscular re-education;Patient/family education    PT Goals (Current goals can be found in the Care Plan section)  Acute Rehab PT Goals Patient Stated Goal: Find out what's going on PT Goal Formulation: With family Time For Goal Achievement: 07/31/22 Potential to Achieve  Goals: Fair    Frequency Min 3X/week     Co-evaluation               AM-PAC PT "6 Clicks" Mobility  Outcome Measure Help needed turning from your back to your side while in a flat bed without using bedrails?: None Help needed moving from lying on your back to sitting on the side of a flat bed without using bedrails?: None Help needed moving to and from a bed to a chair (including a wheelchair)?: A Little Help needed standing up from a chair using your arms (e.g., wheelchair or bedside chair)?: A Little Help needed to walk in hospital room?: A Little Help needed climbing 3-5 steps with a railing? : A Little 6 Click Score: 20    End of Session Equipment Utilized During Treatment: Gait belt Activity Tolerance: Other (comment) (limited by freezing) Patient left: in chair;with call bell/phone within reach;with family/visitor present Nurse Communication: Mobility status PT Visit Diagnosis: Unsteadiness on feet (R26.81);Other abnormalities of gait and mobility (R26.89);Difficulty in walking, not elsewhere classified (R26.2)    Time: 6270-3500 PT Time Calculation (min) (ACUTE ONLY): 40 min   Charges:   PT Evaluation $PT Eval Low Complexity: 1 Low PT Treatments $Gait Training: 8-22 mins $Self Care/Home Management: 8-22        Candie Mile, PT, DPT Physical Therapist Acute Rehabilitation Services [place] 07/24/2022, 4:03  PM

## 2022-07-24 NOTE — Procedures (Addendum)
Patient Name: Carolyn Campbell  MRN: [id number]  Epilepsy Attending: Lora Havens  Referring Physician/Provider: Amie Portland, MD  Duration: 07/24/2022 0310 to 07/24/2022 1232   Patient history: 66yo F with ams. EEG to evaluate for seizure   Level of alertness: Awake, asleep   AEDs during EEG study: None   Technical aspects: This EEG study was done with scalp electrodes positioned according to the 10-20 International system of electrode placement. Electrical activity was reviewed with band pass filter of 1-70Hz , sensitivity of 7 uV/mm, display speed of 67m/sec with a 60Hz  notched filter applied as appropriate. EEG data were recorded continuously and digitally stored.  Video monitoring was available and reviewed as appropriate.   Description: The posterior dominant rhythm consists of 8-9 Hz activity of moderate voltage (25-35 uV) seen predominantly in posterior head regions, symmetric and reactive to eye opening and eye closing. Sleep was characterized by vertex waves, sleep spindles (12 to 14 Hz), maximal frontocentral region. Hyperventilation and photic stimulation were not performed.       IMPRESSION: This study is within normal limits. No seizures or epileptiform discharges were seen throughout the recording.    Carolyn Campbell [name]

## 2022-07-24 NOTE — Discharge Instructions (Addendum)
Information on my medicine - ELIQUIS (apixaban)  This medication education was reviewed with me or my healthcare representative as part of my discharge preparation.   Why was Eliquis prescribed for you? Eliquis was prescribed to treat blood clots that may have been found in the veins of your legs (deep vein thrombosis) or in your lungs (pulmonary embolism) and to reduce the risk of them occurring again.  What do You need to know about Eliquis ? The starting dose is 10 mg (two 5 mg tablets) taken TWICE daily for the FIRST SEVEN (7) DAYS, then on (enter date)  07/25/22  the dose is reduced to ONE 5 mg tablet taken TWICE daily.  Eliquis may be taken with or without food.   Try to take the dose about the same time in the morning and in the evening. If you have difficulty swallowing the tablet whole please discuss with your pharmacist how to take the medication safely.  Take Eliquis exactly as prescribed and DO NOT stop taking Eliquis without talking to the doctor who prescribed the medication.  Stopping may increase your risk of developing a new blood clot.  Refill your prescription before you run out.  After discharge, you should have regular check-up appointments with your healthcare provider that is prescribing your Eliquis.    What do you do if you miss a dose? If a dose of ELIQUIS is not taken at the scheduled time, take it as soon as possible on the same day and twice-daily administration should be resumed. The dose should not be doubled to make up for a missed dose.  Important Safety Information A possible side effect of Eliquis is bleeding. You should call your healthcare provider right away if you experience any of the following: Bleeding from an injury or your nose that does not stop. Unusual colored urine (red or dark brown) or unusual colored stools (red or black). Unusual bruising for unknown reasons. A serious fall or if you hit your head (even if there is no  bleeding).  Some medicines may interact with Eliquis and might increase your risk of bleeding or clotting while on Eliquis. To help avoid this, consult your healthcare provider or pharmacist prior to using any new prescription or non-prescription medications, including herbals, vitamins, non-steroidal anti-inflammatory drugs (NSAIDs) and supplements.  This website has more information on Eliquis (apixaban): http://www.eliquis.com/eliquis/home    Recommendations from Neurologist: - Follow up with outpatient neurologist  - Follow up with PCP to request lab work or hematology follow up to determine cause of blood clots.  - Outpatient seizure precautions: Per [place] statutes, patients with seizures are not allowed to drive until  they have been seizure-free for six months. Use caution when using heavy equipment or power tools. Avoid working on ladders or at heights. Take showers instead of baths. Ensure the water temperature is not too high on the home water heater. Do not go swimming alone. When caring for infants or small children, sit down when holding, feeding, or changing them to minimize risk of injury to the child in the event you have a seizure. Also, Maintain good sleep hygiene. Avoid alcohol. Patient seen and examined by NP/APP with MD. MD to update note as needed.  - Neurohospitalist service will sign off. Please call if there are additional questions.

## 2022-07-24 NOTE — Progress Notes (Signed)
MB maintenance all electrodes. Impedances are below 10k ohms

## 2022-07-24 NOTE — Progress Notes (Signed)
LTM EEG discontinued - no skin breakdown at unhook.   

## 2022-07-24 NOTE — Progress Notes (Addendum)
Neurology Progress Note  Brief HPI: 66 y.o. female  with a history of anxiety, recently diagnosed with memory disorder and started on rivastigmine as well as recent diagnosis of pulmonary embolism who presented to [facility] on 10/30.  Initially, she was complaining of shortness of breath at home, and due to her distress with her shortness of breath 911 was called.  There was no concern for neurological symptoms prior to EMS arrival.  It was with EMS in transport that she developed decreased responsiveness.     Subjective: Patient seen in room. Family is at the bedside, she is sitting up eating breakfast. Discontinue LTM today Upon further chart review she was started on Rivastigmine for sleep disorder by outpatient neurologist on 10/26. We did not continue this inpatient. Recommend following up with outpatient neurologist for medication management and sleep study that was originally scheduled for last friday  Exam: Vitals:   07/24/22 0354 07/24/22 0826  BP: 135/71 136/65  Pulse: 66 63  Resp:  20  Temp: 98.1 F (36.7 C) (!) 97.5 F (36.4 C)  SpO2: 98% 100%   Gen: In bed, NAD Resp: non-labored breathing, no acute distress Abd: soft, nt  Neuro: Mental Status: Mental Status: AA&Ox3  Language: speech is slow, with slight stuttering.  Intact naming, repetition, fluency, and comprehension.  Cranial Nerves:  II: PERRL. Visual fields full to confrontation.  III, IV, VI: EOM in tact. Eyelids elevate symmetrically. Blinks to threat.  V: Sensation intact V1-3 symmetrically  VII: no facial asymmetry   VIII: hearing intact to voice IX, X: Palate elevates symmetrically. Phonation is normal.  HY:WVPXTGGY shrug 5/5 and symmetrical  XII: tongue is midline without fasciculations. Motor:  RUE: 5/5                     LUE: 5/5 RLE: 3/5                      LLE: 3/5 Tone: is normal and bulk is normal DTRs: 2+ and symmetrical throughout   Sensation- Intact to light touch, pin prick, vibratory sensation,  and temperature bilaterally Coordination: FTN intact bilaterally, no ataxia in BLE, no abnormal movements  Gait- deferred  Imaging Reviewed: CT-head 1. No hemorrhage or CT evidence of an acute infarct. 2. ASPECTS is 10   CTA head and neck  No emergent large vessel occlusion or proximal hemodynamically significant stenosis.   MRI brain 1. No acute intracranial abnormality. 2. Mild chronic microvascular ischemic change.   EEG  This essentially normal EEG is recorded in the waking and sleep state.  The excessive beta activity seen is likely secondary to medication effect.  There was no seizure or seizure predisposition recorded on this study. Please note that lack of epileptiform activity on EEG does not preclude the possibility of epilepsy.  LTM: This study is within normal limits. No seizures or epileptiform discharges were seen throughout the recording.     Assessment: 66 year old female who presents with decreased interactivity.  She has an inconsistent exam and has had normal imaging and EEG.  She was transferred to [facility] from [facility] for LTM. There is suspicion for conversion disorder vs seizures. Her symptoms have waxed and waned during her hospitalization. This morning she is awake and alert upon my entrance into her room. Of note, she did not have any improvement with ativan upon her arrival to [facility] ED.  - Exam reveals halting, hypophonic speech with slight stuttering. Overall quality is suggestive of conversion  disorder versus secondary gain or stress-related symptoms. No jerking, twitching or automatisms seen.  - CT-head at [facility]: No hemorrhage or CT evidence of an acute infarct. - CTA head and neck at [facility]: No emergent large vessel occlusion or proximal hemodynamically significant stenosis. - MRI brain at [facility]: No acute intracranial abnormality. Mild chronic microvascular ischemic change. - Spot EEG at [facility]: This essentially normal EEG is recorded in the waking and sleep state.  The  excessive beta activity seen is likely secondary to medication effect.  There was no seizure or seizure predisposition recorded on this study.  - LTM EEG at [facility]: This study is within normal limits. No seizures or epileptiform discharges were seen throughout the recording.   - Patient did have numerous questions about her blood clots. I explained that this is not something she would have to stay in the hospital to work up and that I recommend she follows up with her PCP if she would like hypercoagulable or hematology testing to determine the cause of her PE.   Impression: Conversion disorder vs stress related seizure-like events vs medication reaction to rivastigmine     Recommendations: - Follow up with outpatient neurologist  - Follow up with PCP to request lab work or hematology follow up to determine cause of blood clots.  - Outpatient seizure precautions: Per [facility] statutes, patients with seizures are not allowed to drive until  they have been seizure-free for six months. Use caution when using heavy equipment or power tools. Avoid working on ladders or at heights. Take showers instead of baths. Ensure the water temperature is not too high on the home water heater. Do not go swimming alone. When caring for infants or small children, sit down when holding, feeding, or changing them to minimize risk of injury to the child in the event you have a seizure. Also, Maintain good sleep hygiene. Avoid alcohol. Patient seen and examined by NP/APP with MD. MD to update note as needed.  - Neurohospitalist service will sign off. Please call if there are additional questions.   Janine Ores, DNP, FNP-BC [facility] Pager: [phone]  Electronically signed: Dr. Kerney Elbe

## 2022-07-24 NOTE — TOC Transition Note (Signed)
Transition of Care [facility]) - CM/SW Discharge Note   Patient Details  Name: Carolyn Campbell MRN: [id number] Date of Birth: 09/04/1956  [facility] CM/SW Contact:  Carles Collet, RN Phone Number: 07/24/2022, 3:45 PM   Clinical Narrative:     Ordered HH PT.  [facility], [facility], [facility] unable to take. Accepted by [facility]. No other TOC needs identified for DC  Final next level of care: Home w Home Health Services Barriers to Discharge: No Barriers Identified   Patient Goals and CMS Choice        Discharge Placement                       Discharge Plan and Services   Discharge Planning Services: CM Consult                      HH Arranged: PT HH Agency: [facility] Date [place]: 07/24/22 Time [place]: [place] Representative spoke with at [place]: [place] ([place]) Interventions     Readmission Risk Interventions     No data to display

## 2022-07-26 ENCOUNTER — Telehealth: Payer: Self-pay

## 2022-07-26 NOTE — Telephone Encounter (Signed)
Transition Care Management Follow-up Telephone Call Date of discharge and from where: [facility] 07/24/2022 How have you been since you were released from the hospital? Lite headed when walks Any questions or concerns? No  Items Reviewed: Did the pt receive and understand the discharge instructions provided? Yes  Medications obtained and verified? Yes  Other? No  Any new allergies since your discharge? No  Dietary orders reviewed? Yes Do you have support at home? Yes   Home Care and Equipment/Supplies: Were home health services ordered? no If so, what is the [facility] of the agency? [facility] Has the agency set up a time to come to the patient's home? No patient to call Were any new equipment or medical supplies ordered?  No What is the name of the medical supply agency? N/a Were you able to get the supplies/equipment? no Do you have any questions related to the use of the equipment or supplies? No  Functional Questionnaire: (I = Independent and D = Dependent) ADLs: I  Bathing/Dressing- I  Meal Prep- I  Eating- I  Maintaining continence- I  Transferring/Ambulation- I  Managing Meds- I  Follow up appointments reviewed:  PCP Hospital f/u appt confirmed? Yes  Scheduled to see Delsa Grana on 08/02/2022 @ 1:40. [place] [facility] f/u appt confirmed? No  Are transportation arrangements needed? No  If their condition worsens, is the pt aware to call PCP or go to the Emergency Dept.? Yes Was the patient provided with contact information for the PCP's office or ED? Yes Was to pt encouraged to call back with questions or concerns? Yes  Juanda Crumble, LPN [place] Direct Dial [phone]

## 2022-07-26 NOTE — Telephone Encounter (Signed)
Transition Care Management Unsuccessful Follow-up Telephone Call  Date of discharge and from where:  [facility] 07/24/2022  Attempts:  1st Attempt  Reason for unsuccessful TCM follow-up call:  Left voice message Juanda Crumble, [place] Direct Dial [phone]

## 2022-07-28 ENCOUNTER — Telehealth: Payer: Self-pay | Admitting: Family Medicine

## 2022-07-28 NOTE — Telephone Encounter (Signed)
Patient needs an appointment

## 2022-07-28 NOTE — Telephone Encounter (Signed)
Referral Request - Has patient seen PCP for this complaint? No. *If NO, is insurance requiring patient see PCP for this issue before PCP can refer them? Referral for which specialty: pulmonology Preferred provider/office: [facility]  Tele: [phone] Fax: [phone] Attn: Ulis Rias Reason for referral: pt was in the hospital and blood clots were found in her lungs.  Pt put on Eliquis. She went to a pulmonologist before, but prefers to go to [place]

## 2022-07-28 NOTE — Telephone Encounter (Signed)
Pt has an appt on Monday 11/13

## 2022-08-02 ENCOUNTER — Inpatient Hospital Stay: Payer: Medicare Other | Admitting: Family Medicine

## 2022-08-02 NOTE — Telephone Encounter (Signed)
Copied from [place] [id number]. Topic: General - Other >> Jul 30, 2022  4:26 PM Cyndi Bender wrote: Reason for CRM: Dewana with [facility] reports that the pt requests that PT service start 08/04/22. Cb# [phone] Ext. [place]

## 2022-08-02 NOTE — Telephone Encounter (Signed)
Spoke to [employer] and relayed [facility]

## 2022-08-03 ENCOUNTER — Ambulatory Visit (INDEPENDENT_AMBULATORY_CARE_PROVIDER_SITE_OTHER): Payer: Medicare Other | Admitting: Family Medicine

## 2022-08-03 VITALS — BP 150/70 | HR 73 | Temp 98.0°F | Resp 16 | Ht 60.0 in | Wt 140.3 lb

## 2022-08-03 DIAGNOSIS — R42 Dizziness and giddiness: Secondary | ICD-10-CM | POA: Diagnosis not present

## 2022-08-03 DIAGNOSIS — R6889 Other general symptoms and signs: Secondary | ICD-10-CM

## 2022-08-03 DIAGNOSIS — I2699 Other pulmonary embolism without acute cor pulmonale: Secondary | ICD-10-CM | POA: Diagnosis not present

## 2022-08-03 DIAGNOSIS — G4750 Parasomnia, unspecified: Secondary | ICD-10-CM | POA: Diagnosis not present

## 2022-08-03 DIAGNOSIS — Z7901 Long term (current) use of anticoagulants: Secondary | ICD-10-CM

## 2022-08-03 DIAGNOSIS — Z09 Encounter for follow-up examination after completed treatment for conditions other than malignant neoplasm: Secondary | ICD-10-CM

## 2022-08-03 DIAGNOSIS — E039 Hypothyroidism, unspecified: Secondary | ICD-10-CM

## 2022-08-03 DIAGNOSIS — I69359 Hemiplegia and hemiparesis following cerebral infarction affecting unspecified side: Secondary | ICD-10-CM

## 2022-08-03 DIAGNOSIS — G9340 Encephalopathy, unspecified: Secondary | ICD-10-CM

## 2022-08-03 DIAGNOSIS — Z5181 Encounter for therapeutic drug level monitoring: Secondary | ICD-10-CM

## 2022-08-03 DIAGNOSIS — I69919 Unspecified symptoms and signs involving cognitive functions following unspecified cerebrovascular disease: Secondary | ICD-10-CM

## 2022-08-03 DIAGNOSIS — I10 Essential (primary) hypertension: Secondary | ICD-10-CM

## 2022-08-03 MED ORDER — LEVOTHYROXINE SODIUM 75 MCG PO TABS: 75.0000 ug | ORAL_TABLET | Freq: Every day | ORAL | 0 refills | Status: DC

## 2022-08-03 NOTE — Progress Notes (Unsigned)
Name: Carolyn Campbell   MRN: [id number]    DOB: Apr 23, 1956   Date:08/03/2022       Progress Note  Chief Complaint  Patient presents with   Hospitalization Follow-up    OV:ZCHYI encephalopathy, Pt states feeling much better but still having episodes of dizziness/lightheaded while walking too much. Pt concerned of having moments at the hospital where she couldn't move her body but she could hear and see.     Subjective:   Carolyn Campbell is a 66 y.o. female, presents to clinic for hospital f/up and requesting referrals  Here for hospital follow up/transition of care. Admitted 3x in the past couple weeks  07/16/22-07/17/22 PE  10/30-11/2 transfer to [place] 11/2-11/4   Admit date: 07/19/2022 Discharge date: 07/24/2022 Transition of care was initiated previously by lanette Hamilton on 07/26/2022 and med changes, diagnosis, specialist follow ups and pts symptoms and condition were all reviewed.  Pt was admitted for AMS/acute encephalopathy New medications started per hospitalization include - none Labs due today are- none Pt feels a little better but still having episodes of freezing, can't walk very far gets tired Admit date: 07/22/2022 Discharge date: 07/24/2022   Admitted From: Home Disposition: Home   Recommendations for Outpatient Follow-up:  Follow up with PCP in 1-2 weeks Follow-up with psychiatry as scheduled in the next 2 weeks   Home Health: PT   Discharge Condition: Stable CODE STATUS: Full Diet recommendation: Regular diet as tolerated   Brief/Interim Summary: Carolyn Campbell is a 66 y.o. female with past medical history significant for history of CVA, major depressive disorder, REM sleep behavior disorder, prediabetes, multifactorial cognitive impairment, Crohn's disease, hypothyroidism, HTN, recent diagnosis of pulmonary embolism on Eliquis who presented to [facility] on 10/30 via EMS with complaints of shortness of breath.  While in route to  the ED, EMS reports that patient became "catatonic" and confused with slow response.     Neurology/Psych consulted - recurrent episodes 11/1 and 11/2 - transferred to [facility] main campus for long term EEG to hopefully record one of these episodes to rule in/out seizure.  Patient had notable event while on continuous EEG without any notable epilepsy/epileptiform activity there is ongoing concern this is a behavioral/conversion disorder issue.  Recommend close follow-up with psychiatry in the outpatient setting.  Patient otherwise stable and agreeable for discharge home, lengthy conversation with family at bedside about patient's need for improved activity and sleep hygiene as this may be contributing to patient's symptoms.   Discharge Diagnoses:  Principal Problem:   Acute encephalopathy Active Problems:   Pulmonary embolism (HCC)   Essential hypertension   HLD (hyperlipidemia)   Acquired hypothyroidism   Pt presents with with episodes of dizziness/getting lightheaded after walking a lot Also episodes of feeling paralyzed   She had requested PT referral  She should have psychiatry f/up, neuro f/up  Neuro consult reviewed: Assessment: 66 year old female who presents with decreased interactivity.  She has an inconsistent exam and has had normal imaging and EEG.  She was transferred to [facility] from [facility] for LTM. There is suspicion for conversion disorder vs seizures. Her symptoms have waxed and waned during her hospitalization. This morning she is awake and alert upon my entrance into her room. Of note, she did not have any improvement with ativan upon her arrival to [facility] ED.  - Exam reveals halting, hypophonic speech with slight stuttering. Overall quality is suggestive of conversion disorder versus secondary gain or stress-related symptoms. No jerking,  twitching or automatisms seen.  - CT-head at [facility]: No hemorrhage or CT evidence of an acute infarct. - CTA head and neck at [facility]: No emergent large  vessel occlusion or proximal hemodynamically significant stenosis. - MRI brain at [facility]: No acute intracranial abnormality. Mild chronic microvascular ischemic change. - Spot EEG at [facility]: This essentially normal EEG is recorded in the waking and sleep state.  The excessive beta activity seen is likely secondary to medication effect.  There was no seizure or seizure predisposition recorded on this study.  - LTM EEG at [facility]: This study is within normal limits. No seizures or epileptiform discharges were seen throughout the recording.   - Patient did have numerous questions about her blood clots. I explained that this is not something she would have to stay in the hospital to work up and that I recommend she follows up with her PCP if she would like hypercoagulable or hematology testing to determine the cause of her PE.    Impression: Conversion disorder vs stress related seizure-like events vs medication reaction to rivastigmine      Recommendations: - Follow up with outpatient neurologist  - Follow up with PCP to request lab work or hematology follow up to determine cause of blood clots.  - Outpatient seizure precautions: Per [facility] statutes, patients with seizures are not allowed to drive until  they have been seizure-free for six months. Use caution when using heavy equipment or power tools. Avoid working on ladders or at heights. Take showers instead of baths. Ensure the water temperature is not too high on the home water heater. Do not go swimming alone. When caring for infants or small children, sit down when holding, feeding, or changing them to minimize risk of injury to the child in the event you have a seizure. Also, Maintain good sleep hygiene. Avoid alcohol. Patient seen and examined by NP/APP with MD. MD to update note as needed.  - Neurohospitalist service will sign off. Please call if there are additional questions.    Janine Ores, [place], FNP-BC [facility] Pager:  [phone]   Electronically signed: Dr. Kerney Elbe   Questions about PE - PE last year August 2022 after having COVID  PE dx this year 10/26  Insomnia/parasomnias//excessive daytime sleepiness -last year she was referred to neurology, multiple med changes recently I have reviewed the outpatient office visits through care everywhere extensively and medications and med changes Pt was on clonazepam 0.5 mg - supposed to take 1/2 dose per Dr. Manuella Ghazi, but she wasn't The pt and her husband did cut it back to 1/2 pill about early oct She just did F/up with neurology 10/26 -prior to going to the hospital She has a sleep study coming up in December     Transition of Care [facility]) - CM/SW Discharge Note     Patient Details  Name: Carolyn Campbell MRN: [id number] Date of Birth: 07/16/1956   Transition of Care [facility] CM/SW Contact:  Carles Collet, RN Phone Number: 07/24/2022, 3:45 PM     Clinical Narrative:      Ordered HH PT.  [facility], [facility], [facility] unable to take. Accepted by [facility]. No other TOC needs identified for DC   Final next level of care: Home w Home Health Services Barriers to Discharge: No Barriers Identified     Patient Goals and CMS Choice   Discharge Placement              Discharge Plan and Services Discharge Planning  Services: CM Consult            HH Arranged: PT HH Agency: [facility] Date [place] Agency Contacted: 07/24/22 Time [place]: [id number] Representative spoke with at [place]: Kristi   Hypertension:  Currently managed on losartan - was previously on losartan HCTZ - changed last day of admission  Pt reports good med compliance and denies any SE.   Blood pressure today is elevated controlled. BP Readings from Last 3 Encounters:  08/03/22 (!) 144/72  07/24/22 136/65  07/22/22 (!) 185/95   Pt denies CP, SOB, exertional sx, LE edema, palpitation, Ha's, visual disturbances, , hypotension, syncope. Blood pressures inpatient and labs  were reviewed thoroughly as well as hospitalist progress notes and discharge summary I cannot find any reasons for the blood pressure med changes   Hypothyroidism: Current Medication Regimen: 50 mcg synthroid Takes medicine daily in the morning on empty stomach she endorses good compliance Current Symptoms: Extreme fatigue and cognitive dysfunction, falling asleep easily, slowed thoughts, no weight changes or cold intolerance Most recent results are below; we will be repeating labs 6 to 8 weeks after med change. Lab Results  Component Value Date   TSH 6.178 (H) 07/23/2022   Lab Results  Component Value Date   TSH 6.178 (H) 07/23/2022   TSH 1.05 08/04/2021   TSH 0.73 01/29/2021   TSH 1.10 03/04/2020   TSH 1.99 06/25/2019   TSH 2.34 07/19/2018       Current Outpatient Medications:    apixaban (ELIQUIS) 5 MG TABS tablet, Take 1 tablet (5 mg total) by mouth 2 (two) times daily., Disp: 60 tablet, Rfl: 1   brimonidine (ALPHAGAN) 0.2 % ophthalmic solution, Place 1 drop into both eyes in the morning and at bedtime., Disp: , Rfl:    latanoprost (XALATAN) 0.005 % ophthalmic solution, Place 1 drop into both eyes at bedtime. , Disp: , Rfl:    levothyroxine (SYNTHROID) 50 MCG tablet, Take 1 tablet (50 mcg total) by mouth daily before breakfast. On empty stomach, Disp: 90 tablet, Rfl: 0   losartan (COZAAR) 50 MG tablet, Take 1 tablet (50 mg total) by mouth daily., Disp: 30 tablet, Rfl: 1   Multiple Vitamin (MULTIVITAMIN) tablet, Take 1 tablet by mouth daily., Disp: , Rfl:    rosuvastatin (CRESTOR) 40 MG tablet, Take 1 tablet (40 mg total) by mouth daily., Disp: 90 tablet, Rfl: 0   sertraline (ZOLOFT) 50 MG tablet, Take 1 tablet (50 mg total) by mouth daily., Disp: 30 tablet, Rfl: 2   triamcinolone ointment (KENALOG) 0.1 %, Apply to affected skin one to two times daily for up to two weeks (Patient taking differently: Apply 1 Application topically 2 (two) times daily as needed (for skin rash).),  Disp: 30 g, Rfl: 0   valACYclovir (VALTREX) 1000 MG tablet, Take 1 tablet (1,000 mg total) by mouth 2 (two) times daily. (Patient taking differently: Take 1,000 mg by mouth 2 (two) times daily as needed (for outbreaks).), Disp: 180 tablet, Rfl: 3  Patient Active Problem List   Diagnosis Date Noted   Acute metabolic encephalopathy 51/88/4166   Acute encephalopathy 07/22/2022   Pulmonary embolism ([place]) 07/16/2022   Pain in limb 11/10/2021   Nonobstructive CAD (coronary artery disease) 08/06/2021   Other long term (current) drug therapy 01/22/2021   Varicose veins of both lower extremities with pain 07/19/2018   Arthritis of right hand 12/21/2017   Immunosuppressed status ([place]) 12/21/2017   Major depression in remission ([place]) 12/21/2017  Fibrocystic breast changes 06/22/2017   Osteopenia 10/18/2016   Atherosclerosis of aorta ([place]) 12/19/2015   Vitreous degeneration 05/21/2015   Glaucoma associated with chamber angle anomalies 05/21/2015   H/O malignant neoplasm of breast 05/21/2015   Crohn's disease of both small and large intestine with rectal bleeding ([place]) 12/04/2014   Solitary pulmonary nodule 11/07/2012   Cognitive deficits as late effect of cerebrovascular disease 01/20/2010   CVA, old, hemiparesis ([place]) 04/28/2009   Acquired hypothyroidism 06/12/2008   HLD (hyperlipidemia) 01/18/2007   Essential hypertension 01/18/2007    Past Surgical History:  Procedure Laterality Date   BREAST BIOPSY Left 2006   POS   BREAST LUMPECTOMY Left 09/20/2004   positive   BREAST SURGERY Left    malignant biopsy   CARDIAC CATHETERIZATION     COLONOSCOPY  2015   COLONOSCOPY WITH PROPOFOL N/A 06/08/2017   Procedure: COLONOSCOPY WITH PROPOFOL;  Surgeon: Lin Landsman, MD;  Location: [place];  Service: Gastroenterology;  Laterality: N/A;   COLONOSCOPY WITH PROPOFOL N/A 03/08/2019   Procedure: COLONOSCOPY WITH PROPOFOL;  Surgeon: Lin Landsman, MD;  Location: [facility] ENDOSCOPY;   Service: Gastroenterology;  Laterality: N/A;   COLONOSCOPY WITH PROPOFOL N/A 07/16/2020   Procedure: COLONOSCOPY WITH PROPOFOL;  Surgeon: Lin Landsman, MD;  Location: [facility] ENDOSCOPY;  Service: Gastroenterology;  Laterality: N/A;   ESOPHAGOGASTRODUODENOSCOPY  2015   ESOPHAGOGASTRODUODENOSCOPY (EGD) WITH PROPOFOL N/A 06/08/2017   Procedure: ESOPHAGOGASTRODUODENOSCOPY (EGD) WITH PROPOFOL;  Surgeon: Lin Landsman, MD;  Location: [place];  Service: Gastroenterology;  Laterality: N/A;   ESOPHAGOGASTRODUODENOSCOPY (EGD) WITH PROPOFOL N/A 03/08/2019   Procedure: ESOPHAGOGASTRODUODENOSCOPY (EGD) WITH PROPOFOL;  Surgeon: Lin Landsman, MD;  Location: [facility] ENDOSCOPY;  Service: Gastroenterology;  Laterality: N/A;   FINGER SURGERY     Right small finger   FRACTURE SURGERY     GIVENS CAPSULE STUDY  2015   TUBAL LIGATION      Family History  Problem Relation Age of Onset   Heart disease Mother    Heart attack Mother    Cerebrovascular Accident Father    Arthritis Father    Hypertension Father    Breast cancer Sister    Breast cancer Sister 84   Cancer Sister    Hypertension Other    Diabetes Other    Heart attack Other 31    Social History   Tobacco Use   Smoking status: Never   Smokeless tobacco: Never  Vaping Use   Vaping Use: Never used  Substance Use Topics   Alcohol use: No    Alcohol/week: 0.0 standard drinks of alcohol   Drug use: No     No Known Allergies  Health Maintenance  Topic Date Due   Medicare Annual Wellness (AWV)  11/05/2020   TETANUS/TDAP  03/28/2022   COVID-19 Vaccine (8 - Pfizer risk series) 09/02/2022   DEXA SCAN  09/25/2022   MAMMOGRAM  06/25/2023   COLONOSCOPY (Pts 45-79yr Insurance coverage will need to be confirmed)  07/16/2025   Pneumonia Vaccine 66 Years old  Completed   INFLUENZA VACCINE  Completed   Hepatitis C Screening  Completed   Zoster Vaccines- Shingrix  Completed   HPV VACCINES  Aged Out    Chart Review  Today: I personally reviewed active problem list, medication list, allergies, family history, social history, health maintenance, notes from last encounter, lab results, imaging with the patient/caregiver today.   Review of Systems  Constitutional: Negative.   HENT: Negative.    Eyes: Negative.  Respiratory: Negative.    Cardiovascular: Negative.   Gastrointestinal: Negative.   Endocrine: Negative.   Genitourinary: Negative.   Musculoskeletal: Negative.   Skin: Negative.   Allergic/Immunologic: Negative.   Neurological: Negative.   Hematological: Negative.   Psychiatric/Behavioral: Negative.    All other systems reviewed and are negative.    Objective:   Vitals:   08/03/22 1309 08/03/22 1326 08/03/22 1427  BP: (!) 148/70 (!) 144/72 (!) 150/70  Pulse: 73    Resp: 16    Temp: 98 F (36.7 C)    TempSrc: Oral    SpO2: 99%    Weight: 140 lb 4.8 oz (63.6 kg)    Height: 5' (1.524 m)      Body mass index is 27.4 kg/m.  Physical Exam Vitals and nursing note reviewed.  Constitutional:      General: She is not in acute distress.    Appearance: Normal appearance. She is well-developed. She is not ill-appearing, toxic-appearing or diaphoretic.  HENT:     Head: Normocephalic and atraumatic.     Right Ear: External ear normal.     Nose: Nose normal.  Eyes:     General:        Right eye: No discharge.        Left eye: No discharge.     Conjunctiva/sclera: Conjunctivae normal.  Neck:     Trachea: No tracheal deviation.  Cardiovascular:     Rate and Rhythm: Normal rate and regular rhythm.     Pulses: Normal pulses.     Heart sounds: Normal heart sounds.  Pulmonary:     Effort: Pulmonary effort is normal. No respiratory distress.     Breath sounds: Normal breath sounds. No stridor. No wheezing, rhonchi or rales.  Musculoskeletal:     Right lower leg: No edema.     Left lower leg: No edema.  Skin:    General: Skin is warm and dry.     Coloration: Skin is not jaundiced  or pale.     Findings: No bruising or rash.  Neurological:     Mental Status: She is alert.     Cranial Nerves: No dysarthria or facial asymmetry.     Sensory: Sensation is intact.     Motor: No weakness, tremor or abnormal muscle tone.     Coordination: Coordination is intact. Coordination normal.     Gait: Gait is intact.     Comments: Stuttering occasionally   Psychiatric:        Mood and Affect: Mood normal.        Behavior: Behavior normal.         Assessment & Plan:     ICD-10-CM   1. Encounter for examination following treatment at hospital  Z09 CBC with Differential/Platelet    COMPLETE METABOLIC PANEL WITH GFR    TSH   multiple hospitalizations in the past 2-3 weeks, first f/up, all admissions, notes, consults, labs reviewed    2. Other acute pulmonary embolism without acute cor pulmonale (HCC)  I26.99 Ambulatory referral to Hematology / Oncology    CBC with Differential/Platelet    COMPLETE METABOLIC PANEL WITH GFR   second PE, needs hematology consult and work up - doing well on NOAC, still having fatigue and DOE, no CP    3. Dizziness  R42 CBC with Differential/Platelet    COMPLETE METABOLIC PANEL WITH GFR    TSH   she has HH PT - they havent' come out yet, will work on dizziness/weakness/gait/balance  4. Parasomnia, unspecified type  G47.50 TSH   f/up Dr. Manuella Ghazi    5. Cognitive deficits as late effect of cerebrovascular disease  I69.919 TSH   follow with Dr. Manuella Ghazi - we will optimize thyroid, she should continue work with neuro and psych, strongly recommend sleep study be completed    6. Acquired hypothyroidism  E03.9 levothyroxine (SYNTHROID) 75 MCG tablet    TSH   TSH was high, dose increased and we will recheck sx and labs in 6 weeks (TSH was ~1, up to 6+, increase dose to 75 mcg q d)    7. Essential hypertension  N39 COMPLETE METABOLIC PANEL WITH GFR   Bp elevated, med changed w/o documentation as to why - will recheck in 2 weeks, likely have her go back  to losartan-HCTZ    8. Acute encephalopathy  G93.40 TSH   psych/neuro/med SE?/ benzo withdrawal? sx improving, continue outpt with neuro and psychiatry    9. Exercise intolerance  R68.89 CBC with Differential/Platelet    COMPLETE METABOLIC PANEL WITH GFR    TSH   may be due to PE and/or deconditioning  I think some pulm/cardiology rehab may be helpful down the road    10. CVA, old, hemiparesis ([place]) Chronic I69.359    see above    11. Encounter for monitoring direct oral anticoagulant therapy  Z51.81 CBC with Differential/Platelet   J67.34 COMPLETE METABOLIC PANEL WITH GFR        HTN - meds changed on last day of admission but I cannot find a reason why BP is high today She had previously been on losartan-HCTZ 50-12.5 for 1.5 years and was tolerating it fine, BP inpt was near goal, electrolytes and renal function were normal.  I cannot find documented reason for the change We will continue the same for now - I have msgs the hospitalists who d/c her to inquire We will have her f/up for bp recheck Can have her take losartanHCTZ again or increase losartan dose to 100 mg if not at goal - we will decide w/ f/up visit  TSH high - will increase levothyroxine, with severe fatigue, somnolence, decreased cognitive function we will optimize her thyroid med dose and recheck her TSH in 6 weeks   A second pulmonary embolism, first in August 2022 following COVID most recently October 26, referred to hematology, continue NOAC  Multiple complaints and concerns - many are probably multifactorial She is still pending a sleep study -OSA poor REM sleep likely is contributing towards many of her symptoms She was on benzos daily and then the dose was supposed to be decreased because meds were too strong and then suddenly stopped she may have had several of her symptoms from the benzos himself or from some minor withdrawal symptoms She was also given a new medication to try just prior to these 3  hospitalizations that may have also caused some of her symptoms or side effects  Encouraged her strongly to follow-up with Dr. Shah/neurology outpatient and psychiatry   Return for 2 week f/up on meds, BP .  Consider restarting losartan-HCTZ 50-12.5 if BP is still high  6-8 week f/up with labs/TSH Monitor pt for levothyroxine dose changes prior to that with f/up visit  Delsa Grana, PA-C 08/03/22 1:24 PM

## 2022-08-04 ENCOUNTER — Telehealth: Payer: Self-pay | Admitting: Family Medicine

## 2022-08-04 NOTE — Telephone Encounter (Signed)
Home Health Verbal Orders - Caller/Agency: [place] Number: [phone] Requesting OT/PT/Skilled Nursing/Social Work/Speech Therapy: PT  Frequency:   1w6

## 2022-08-05 ENCOUNTER — Ambulatory Visit: Payer: Medicare Other | Admitting: Psychiatry

## 2022-08-05 ENCOUNTER — Encounter: Payer: Self-pay | Admitting: Family Medicine

## 2022-08-05 NOTE — Telephone Encounter (Signed)
VO given to [facility]

## 2022-08-06 ENCOUNTER — Inpatient Hospital Stay: Payer: Medicare Other

## 2022-08-06 ENCOUNTER — Encounter: Payer: Self-pay | Admitting: Internal Medicine

## 2022-08-06 ENCOUNTER — Inpatient Hospital Stay: Payer: Medicare Other | Attending: Internal Medicine | Admitting: Internal Medicine

## 2022-08-06 VITALS — BP 159/63 | HR 66 | Temp 98.0°F | Resp 16 | Ht 60.0 in | Wt 142.0 lb

## 2022-08-06 DIAGNOSIS — E039 Hypothyroidism, unspecified: Secondary | ICD-10-CM | POA: Diagnosis not present

## 2022-08-06 DIAGNOSIS — Z8673 Personal history of transient ischemic attack (TIA), and cerebral infarction without residual deficits: Secondary | ICD-10-CM | POA: Diagnosis not present

## 2022-08-06 DIAGNOSIS — J4489 Other specified chronic obstructive pulmonary disease: Secondary | ICD-10-CM | POA: Diagnosis not present

## 2022-08-06 DIAGNOSIS — Z7901 Long term (current) use of anticoagulants: Secondary | ICD-10-CM | POA: Insufficient documentation

## 2022-08-06 DIAGNOSIS — I2699 Other pulmonary embolism without acute cor pulmonale: Secondary | ICD-10-CM

## 2022-08-06 DIAGNOSIS — K5090 Crohn's disease, unspecified, without complications: Secondary | ICD-10-CM | POA: Insufficient documentation

## 2022-08-06 DIAGNOSIS — I10 Essential (primary) hypertension: Secondary | ICD-10-CM | POA: Diagnosis not present

## 2022-08-06 DIAGNOSIS — Z86711 Personal history of pulmonary embolism: Secondary | ICD-10-CM | POA: Insufficient documentation

## 2022-08-06 DIAGNOSIS — I2510 Atherosclerotic heart disease of native coronary artery without angina pectoris: Secondary | ICD-10-CM | POA: Insufficient documentation

## 2022-08-06 NOTE — Progress Notes (Signed)
[place]  Telephone:([phone] Fax:(336) [phone]  ID: Carolyn Campbell OB: 1955/11/11  MR#: [id number]  XTK#:[id number]  Patient Care Team: Delsa Grana, PA-C as PCP - General (Family Medicine) Rockey Situ Kathlene November, MD as PCP - Cardiology (Cardiology) Lin Landsman, MD as Consulting Physician (Gastroenterology) Idelle Leech, OD as Consulting Physician (Optometry) Beverly Gust, MD as Consulting Physician (Otolaryngology)  REFERRING PROVIDER: Delsa Grana  REASON FOR REFERRAL: unprovoked acute PE  HPI: Carolyn Campbell is a 66 y.o. female with past medical history of COPD, COVID, Crohn's, hyperlipidemia, hypertension, hypothyroidism, CAD, stroke, depression was referred to hematology with history of recurrent PE.  Patient was admitted in October 2023 for shortness of breath.  CTA chest from 07/19/2022 showed small clot in the lower lobe branch of right pulmonary artery with mild right heart strain. At that time she also had episode of catatonia and loss of consciousness.  She was evaluated by neurology and psychiatry with EEG.  There was concern for behavioral disorder. Patient denies any history of recent surgery, long distance flights or OCP use.  Patient had PE in August 2022 which was attributed to her recent COVID infection.  She was anticoagulated for 3 months.  She is currently on Eliquis and tolerating well.  REVIEW OF SYSTEMS:   ROS  As per HPI. Otherwise, a complete review of systems is negative.  PAST MEDICAL HISTORY: Past Medical History:  Diagnosis Date   Abdominal pain, epigastric    Allergic rhinitis    Arthritis    Asthma    Breast cancer ([place]) 2006   LT LUMPECTOMY   Clotting disorder ([place])    Cognitive deficits as late effect of cerebrovascular disease    COPD (chronic obstructive pulmonary disease) ([place])    COVID-19 virus infection 04/2021   Crohn's disease ([place]) 05/21/2015   FOLLOWED BY GI   Crohn's disease of both small and  large intestine with rectal bleeding ([place]) 12/04/2014   Depression    Currently taking zoloft.   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 05/2021 Echo: EF 60-65%, no rwma, nl RV fxn.   History of kidney stones    Hypercholesterolemia 01/18/2007   Hyperlipidemia    Hypertension    Hypothyroidism    Low back pain radiating to left leg 06/17/2021   Nonobstructive CAD (coronary artery disease)    a. 10/2012 Cath: diffuse minor irregs-->med rx; b. 08/2021 Cor CTA: Ca2+ = 55.6 (77th%'ile), LAD 25p, LCX <25p. No signif non-cardiac findings.   Occlusion, cerebral artery    NOS w/infarction   Osteoporosis    Overweight (BMI 25.0-29.9) 07/17/2022   Personal history of radiation therapy 2006   BREAST CA   Pulmonary embolism ([place])    a.04/2021 CTA Chest: Small filling defects are noted in upper and lower lobe branches of L PA-->acute PE-->eliquis. *PE occurred in context of COVID infxn.   Stroke [facility])    Ulcer     PAST SURGICAL HISTORY: Past Surgical History:  Procedure Laterality Date   BREAST BIOPSY Left 2006   POS   BREAST LUMPECTOMY Left 09/20/2004   positive   BREAST SURGERY Left    malignant biopsy   CARDIAC CATHETERIZATION     COLONOSCOPY  2015   COLONOSCOPY WITH PROPOFOL N/A 06/08/2017  Procedure: COLONOSCOPY WITH PROPOFOL;  Surgeon: Lin Landsman, MD;  Location: [facility];  Service: Gastroenterology;  Laterality: N/A;   COLONOSCOPY WITH PROPOFOL N/A 03/08/2019   Procedure: COLONOSCOPY WITH PROPOFOL;  Surgeon: Lin Landsman, MD;  Location: [facility] ENDOSCOPY;  Service: Gastroenterology;  Laterality: N/A;   COLONOSCOPY WITH PROPOFOL N/A 07/16/2020   Procedure: COLONOSCOPY WITH PROPOFOL;  Surgeon: Lin Landsman, MD;  Location: [facility] ENDOSCOPY;  Service: Gastroenterology;  Laterality:  N/A;   ESOPHAGOGASTRODUODENOSCOPY  2015   ESOPHAGOGASTRODUODENOSCOPY (EGD) WITH PROPOFOL N/A 06/08/2017   Procedure: ESOPHAGOGASTRODUODENOSCOPY (EGD) WITH PROPOFOL;  Surgeon: Lin Landsman, MD;  Location: [facility];  Service: Gastroenterology;  Laterality: N/A;   ESOPHAGOGASTRODUODENOSCOPY (EGD) WITH PROPOFOL N/A 03/08/2019   Procedure: ESOPHAGOGASTRODUODENOSCOPY (EGD) WITH PROPOFOL;  Surgeon: Lin Landsman, MD;  Location: [facility] ENDOSCOPY;  Service: Gastroenterology;  Laterality: N/A;   FINGER SURGERY     Right small finger   FRACTURE SURGERY     GIVENS CAPSULE STUDY  2015   TUBAL LIGATION      FAMILY HISTORY: Family History  Problem Relation Age of Onset   Heart disease Mother    Heart attack Mother    Cerebrovascular Accident Father    Arthritis Father    Hypertension Father    Breast cancer Sister    Breast cancer Sister 1   Cancer Sister    Hypertension Other    Diabetes Other    Heart attack Other 30    HEALTH MAINTENANCE: Social History   Tobacco Use   Smoking status: Never   Smokeless tobacco: Never  Vaping Use   Vaping Use: Never used  Substance Use Topics   Alcohol use: No    Alcohol/week: 0.0 standard drinks of alcohol   Drug use: No     No Known Allergies  Current Outpatient Medications  Medication Sig Dispense Refill   apixaban (ELIQUIS) 5 MG TABS tablet Take 1 tablet (5 mg total) by mouth 2 (two) times daily. 60 tablet 1   brimonidine (ALPHAGAN) 0.2 % ophthalmic solution Place 1 drop into both eyes in the morning and at bedtime.     latanoprost (XALATAN) 0.005 % ophthalmic solution Place 1 drop into both eyes at bedtime.      levothyroxine (SYNTHROID) 75 MCG tablet Take 1 tablet (75 mcg total) by mouth daily before breakfast. 90 tablet 0   losartan (COZAAR) 50 MG tablet Take 1 tablet (50 mg total) by mouth daily. 30 tablet 1   Multiple Vitamin (MULTIVITAMIN) tablet Take 1 tablet by mouth daily.     rosuvastatin (CRESTOR) 40 MG  tablet Take 1 tablet (40 mg total) by mouth daily. 90 tablet 0   sertraline (ZOLOFT) 50 MG tablet Take 1 tablet (50 mg total) by mouth daily. 30 tablet 2   No current facility-administered medications for this visit.    OBJECTIVE: Vitals:   08/06/22 1347  BP: (!) 159/63  Pulse: 66  Resp: 16  Temp: 98 F (36.7 C)  SpO2: 98%     Body mass index is 27.73 kg/m.      General: Well-developed, well-nourished, no acute distress. Eyes: Pink conjunctiva, anicteric sclera. HEENT: Normocephalic, moist mucous membranes, clear oropharnyx. Lungs: Clear to auscultation bilaterally. Heart: Regular rate and rhythm. No rubs, murmurs, or gallops. Abdomen: Soft, nontender, nondistended. No organomegaly noted, normoactive bowel sounds. Musculoskeletal: No edema, cyanosis, or clubbing. Neuro: Alert, answering all questions appropriately. Cranial nerves grossly intact. Skin: No rashes or petechiae noted. Psych: Normal affect. Lymphatics: No  cervical, calvicular, axillary or inguinal LAD.   LAB RESULTS:  Lab Results  Component Value Date   NA 140 07/23/2022   K 3.5 07/23/2022   CL 107 07/23/2022   CO2 26 07/23/2022   GLUCOSE 102 (H) 07/23/2022   BUN 12 07/23/2022   CREATININE 0.76 07/23/2022   CALCIUM 9.5 07/23/2022   PROT 6.4 (L) 07/23/2022   ALBUMIN 3.3 (L) 07/23/2022   AST 20 07/23/2022   ALT 13 07/23/2022   ALKPHOS 50 07/23/2022   BILITOT 0.7 07/23/2022   GFRNONAA >60 07/23/2022   GFRAA 78 01/29/2021    Lab Results  Component Value Date   WBC 6.4 07/23/2022   NEUTROABS 3.6 07/23/2022   HGB 11.2 (L) 07/23/2022   HCT 34.6 (L) 07/23/2022   MCV 89.4 07/23/2022   PLT 212 07/23/2022    Lab Results  Component Value Date   TIBC 330 10/17/2019   TIBC 378 01/17/2018   FERRITIN 66 04/23/2022   FERRITIN 76 10/17/2019   FERRITIN 56 01/17/2018   IRONPCTSAT 26 10/17/2019   IRONPCTSAT 14 01/17/2018     STUDIES: Overnight EEG with video  Result Date: 07/23/2022 Lora Havens, MD     07/24/2022  9:23 AM Patient Name: BRICIA TAHER MRN: [id number] Epilepsy Attending: Lora Havens Referring Physician/Provider: Amie Portland, MD Duration: 07/23/2022 0310 to 07/24/2022 0310 Patient history: 66yo F with ams. EEG to evaluate for seizure Level of alertness: Awake, asleep AEDs during EEG study: None Technical aspects: This EEG study was done with scalp electrodes positioned according to the 10-20 International system of electrode placement. Electrical activity was reviewed with band pass filter of 1-70Hz , sensitivity of 7 uV/mm, display speed of 77m/sec with a 60Hz  notched filter applied as appropriate. EEG data were recorded continuously and digitally stored.  Video monitoring was available and reviewed as appropriate. Description: The posterior dominant rhythm consists of 8-9 Hz activity of moderate voltage (25-35 uV) seen predominantly in posterior head regions, symmetric and reactive to eye opening and eye closing. Sleep was characterized by vertex waves, sleep spindles (12 to 14 Hz), maximal frontocentral region. Hyperventilation and photic stimulation were not performed.   At 1156 on 07/23/2022, patient's family notified RN that patient was zoning out. Concomitant eeg before, during and after the event didn't show any eeg change to suggest seizure. IMPRESSION: This study is within normal limits. No seizures or epileptiform discharges were seen throughout the recording. At 1156 on 07/23/2022, patient's family notified RN that patient was zoning out.. Concomitant eeg before, during and after the event didn't show any eeg change to suggest seizure.  This was most likely NOT an epileptic event. PLora Havens  EEG adult  Result Date: 07/20/2022 KGreta Doom MD     07/20/2022  2:15 PM History: 66year old female being evaluated for decreased responsiveness Sedation: No current sedation, did receive Ativan yesterday Technique: This EEG was acquired with electrodes placed  according to the International 10-20 electrode system (including Fp1, Fp2, F3, F4, C3, C4, P3, P4, O1, O2, T3, T4, T5, T6, A1, A2, Fz, Cz, Pz). The following electrodes were missing or displaced: none. Background: The background is dominated by excessive frontally predominant beta activity.  This does give a sharply contoured appearance to the background, but no clear epileptiform discharges were seen.  There is a posterior dominant rhythm of 10 Hz which is seen at times and attenuates with eye opening.  With sleep, there are bilaterally symmetrical sleep structures observed.  No  clearly epileptiform activity was seen. Photic stimulation: Physiologic driving is not performed EEG Abnormalities: Excessive beta activity Clinical Interpretation: This essentially normal EEG is recorded in the waking and sleep state.  The excessive beta activity seen is likely secondary to medication effect.  There was no seizure or seizure predisposition recorded on this study. Please note that lack of epileptiform activity on EEG does not preclude the possibility of epilepsy. Roland Rack, MD [facility] [phone] If 7pm- 7am, please page neurology on call as listed in [place].   MR BRAIN WO CONTRAST  Result Date: 07/19/2022 CLINICAL DATA:  Altered mental status. EXAM: MRI HEAD WITHOUT CONTRAST TECHNIQUE: Multiplanar, multiecho pulse sequences of the brain and surrounding structures were obtained without intravenous contrast. COMPARISON:  None Available. FINDINGS: Brain: No acute infarction, hemorrhage, hydrocephalus, extra-axial collection or mass lesion. Incidentally noted is a partially empty sella. Sequela of mild chronic microvascular ischemic change. C Vascular: Normal flow voids. Skull and upper cervical spine: Normal marrow signal. Sinuses/Orbits: Bilateral lens replacement. Other: None IMPRESSION: 1. No acute intracranial abnormality. 2. Mild chronic microvascular ischemic change. Electronically Signed    By: Marin Roberts M.D.   On: 07/19/2022 18:22   US Venous Img Lower Bilateral  Result Date: 07/19/2022 CLINICAL DATA:  Lower extremity swelling EXAM: BILATERAL LOWER EXTREMITY VENOUS DOPPLER ULTRASOUND TECHNIQUE: Gray-scale sonography with graded compression, as well as color Doppler and duplex ultrasound were performed to evaluate the lower extremity deep venous systems from the level of the common femoral vein and including the common femoral, femoral, profunda femoral, popliteal and calf veins including the posterior tibial, peroneal and gastrocnemius veins when visible. The superficial great saphenous vein was also interrogated. Spectral Doppler was utilized to evaluate flow at rest and with distal augmentation maneuvers in the common femoral, femoral and popliteal veins. COMPARISON:  None Available. FINDINGS: RIGHT LOWER EXTREMITY VENOUS Normal compressibility of the RIGHT common femoral, superficial femoral, and popliteal veins, as well as the visualized calf veins. Visualized portions of profunda femoral vein and great saphenous vein unremarkable. No filling defects to suggest DVT on grayscale or color Doppler imaging. Doppler waveforms show normal direction of venous flow, normal respiratory plasticity and response to augmentation. OTHER No evidence of superficial thrombophlebitis or abnormal fluid collection. Limitations: none LEFT LOWER EXTREMITY VENOUS Normal compressibility of the LEFT common femoral, superficial femoral, and popliteal veins, as well as the visualized calf veins. Visualized portions of profunda femoral vein and great saphenous vein unremarkable. No filling defects to suggest DVT on grayscale or color Doppler imaging. Doppler waveforms show normal direction of venous flow, normal respiratory plasticity and response to augmentation. OTHER No evidence of superficial thrombophlebitis or abnormal fluid collection. Limitations: none IMPRESSION: No evidence of femoropopliteal DVT or  superficial thrombophlebitis within either lower extremity. Michaelle Birks, MD [facility] Radiology Electronically Signed   By: Michaelle Birks M.D.   On: 07/19/2022 17:30   CT Angio Chest Pulmonary Embolism (PE) W or WO Contrast  Result Date: 07/19/2022 CLINICAL DATA:  Shortness of breath EXAM: CT ANGIOGRAPHY CHEST WITH CONTRAST TECHNIQUE: Multidetector CT imaging of the chest was performed using the standard protocol during bolus administration of intravenous contrast. Multiplanar CT image reconstructions and MIPs were obtained to evaluate the vascular anatomy. RADIATION DOSE REDUCTION: This exam was performed according to the departmental dose-optimization program which includes automated exposure control, adjustment of the mA and/or kV according to patient size and/or use of iterative reconstruction technique. CONTRAST:  171m OMNIPAQUE IOHEXOL 350 MG/ML  SOLN COMPARISON:  CTA Chest 07/16/22 FINDINGS: Cardiovascular: Assessment for the presence of PE is markedly limited due to a combination of respiratory motion artifact and poor opacification of the pulmonary arteries. Of note, the thrombus seen on recent prior CTA chest on 07/16/22 is not visualized on this exam. No evidence of pulmonary embolism in the main pulmonary arteries. Normal heart size. No pericardial effusion. Mediastinum/Nodes: No enlarged mediastinal, hilar, or axillary lymph nodes. Thyroid gland, trachea, and esophagus demonstrate no significant findings. Lungs/Pleura: No suspicious pulmonary nodules are visualized. Note that assessment is limited due to respiratory motion artifact. No pleural effusion or pneumothorax. Upper Abdomen: No acute abnormality. Musculoskeletal: No chest wall abnormality. No acute or significant osseous findings. Small intramuscular lipoma along the anterior chest wall (series 11, image 799). Review of the MIP images confirms the above findings. IMPRESSION: Assessment for  the presence of PE is markedly limited due to a combination of respiratory motion artifact and poor opacification of the pulmonary arteries. Of note, the thrombus seen on recent prior CTA chest on 07/16/22 is not visualized on this exam. No evidence of pulmonary embolism in the main pulmonary arteries. No CT evidence of right heart strain. Electronically Signed   By: Marin Roberts M.D.   On: 07/19/2022 15:24   CT ANGIO HEAD NECK W WO CM (CODE STROKE)  Result Date: 07/19/2022 CLINICAL DATA:  Neuro deficit, acute, stroke suspected EXAM: CT ANGIOGRAPHY HEAD AND NECK TECHNIQUE: Multidetector CT imaging of the head and neck was performed using the standard protocol during bolus administration of intravenous contrast. Multiplanar CT image reconstructions and MIPs were obtained to evaluate the vascular anatomy. Carotid stenosis measurements (when applicable) are obtained utilizing NASCET criteria, using the distal internal carotid diameter as the denominator. RADIATION DOSE REDUCTION: This exam was performed according to the departmental dose-optimization program which includes automated exposure control, adjustment of the mA and/or kV according to patient size and/or use of iterative reconstruction technique. CONTRAST:  164m OMNIPAQUE IOHEXOL 350 MG/ML SOLN COMPARISON:  CT head from the same day. FINDINGS: CTA NECK FINDINGS Aortic arch: Great vessel origins are patent without significant stenosis. Right carotid system: No evidence of dissection, stenosis (50% or greater), or occlusion. Left carotid system: No evidence of dissection, stenosis (50% or greater), or occlusion. Vertebral arteries: Codominant. No evidence of dissection, stenosis (50% or greater), or occlusion. Skeleton: No acute findings. Other neck: No acute findings. Upper chest: Visualized lung apices are clear. Review of the MIP images confirms the above findings CTA HEAD FINDINGS Anterior circulation: Bilateral intracranial ICAs are patent with mild  narrowing due to calcific atherosclerosis. Bilateral MCAs and ACAs are patent without proximal hemodynamically significantly stenosis. Posterior circulation: Bilateral intradural vertebral arteries, basilar artery and bilateral posterior cerebral arteries are patent without proximal hemodynamically significant stenosis. Venous sinuses: As permitted by contrast timing, patent. Review of the MIP images confirms the above findings IMPRESSION: No emergent large vessel occlusion or proximal hemodynamically significant stenosis. Electronically Signed   By: FMargaretha SheffieldM.D.   On: 07/19/2022 15:04   CT HEAD CODE STROKE WO CONTRAST`  Result Date: 07/19/2022 CLINICAL DATA:  Code stroke.  [place] EXAM: CT HEAD WITHOUT CONTRAST TECHNIQUE: Contiguous axial images were obtained from the base of the skull through the vertex without intravenous contrast. RADIATION DOSE REDUCTION: This exam was performed according to the departmental dose-optimization program which includes automated exposure control, adjustment of the mA and/or kV according to patient size and/or use of iterative reconstruction technique. COMPARISON:  None Available. FINDINGS:  Brain: No evidence of acute infarction, hemorrhage, hydrocephalus, extra-axial collection or mass lesion/mass effect. Vascular: No hyperdense vessel or unexpected calcification. Skull: Normal. Negative for fracture or focal lesion. Sinuses/Orbits: Bilateral lens replacements. Other: None. ASPECTS ([place] Stroke Program Early CT Score): 10 IMPRESSION: 1. No hemorrhage or CT evidence of an acute infarct. 2. ASPECTS is 10 Discussed with Dr. Leonel Ramsay on 07/19/22 at 2:45 PM via telephone. Electronically Signed   By: Marin Roberts M.D.   On: 07/19/2022 14:51   CT Angio Chest PE W and/or Wo Contrast  Result Date: 07/16/2022 CLINICAL DATA:  Chest pain. EXAM: CT ANGIOGRAPHY CHEST WITH CONTRAST TECHNIQUE: Multidetector CT imaging of the chest was performed using the standard  protocol during bolus administration of intravenous contrast. Multiplanar CT image reconstructions and MIPs were obtained to evaluate the vascular anatomy. RADIATION DOSE REDUCTION: This exam was performed according to the departmental dose-optimization program which includes automated exposure control, adjustment of the mA and/or kV according to patient size and/or use of iterative reconstruction technique. CONTRAST:  49m OMNIPAQUE IOHEXOL 350 MG/ML SOLN COMPARISON:  August 20, 2021 FINDINGS: Cardiovascular: There is moderate to marked severity calcification of the thoracic aorta, without evidence of aortic aneurysm. A small amount of intraluminal low attenuation is seen within a lower lobe branch of the right pulmonary artery (axial CT image 41, CT series 4). Normal heart size. Mild right heart strain is seen (RV/LV ratio of 1.04). No pericardial effusion. Mediastinum/Nodes: No enlarged mediastinal, hilar, or axillary lymph nodes. Thyroid gland, trachea, and esophagus demonstrate no significant findings. Lungs/Pleura: Mild atelectasis is seen within the posterior aspect of the bilateral lower lobes. There is no evidence of acute infiltrate, pleural effusion or pneumothorax. Upper Abdomen: No acute abnormality. Musculoskeletal: No chest wall abnormality. No acute or significant osseous findings. Review of the MIP images confirms the above findings. IMPRESSION: 1. Small amount of pulmonary embolism within a lower lobe branch of the right pulmonary artery. 2. Mild right heart strain (RV/LV ratio of 1.04). 3. Mild bilateral lower lobe atelectasis. 4. Aortic atherosclerosis. Aortic Atherosclerosis (ICD10-I70.0). Electronically Signed   By: TVirgina NorfolkM.D.   On: 07/16/2022 21:16   DG Chest 2 View  Result Date: 07/16/2022 CLINICAL DATA:  Chest pain EXAM: CHEST - 2 VIEW COMPARISON:  Chest x-ray 07/01/2021 FINDINGS: The heart size and mediastinal contours are within normal limits. Both lungs are clear. The  visualized skeletal structures are unremarkable. IMPRESSION: No active cardiopulmonary disease. Electronically Signed   By: ARonney AstersM.D.   On: 07/16/2022 16:42    ASSESSMENT AND PLAN:   LRUTHELLEN TIPPYis a 66y.o. female with pmh of of COPD, COVID, Crohn's, hyperlipidemia, hypertension, hypothyroidism, CAD, stroke, depression was referred to hematology with history of recurrent PE.  #[place] of [place] in [place]22 [place]#Acute PE, unprovoked in October 2023 -Acute PE with unclear etiology when presented with shortness of breath.  -Patient is on Eliquis and tolerating well.  She is interested in hypercoagulable work-up.   Orders Placed This Encounter  Procedures   Antithrombin III   Protein C activity   Protein C, total   Protein S activity   Protein S, total   Beta-2-glycoprotein i abs, IgG/M/A   Factor 5 leiden   Prothrombin gene mutation   Cardiolipin antibodies, IgG, IgM, IgA   Hexagonal Phase Phospholipid   RTC in 2 weeks for MD visit, discuss labs.  Patient expressed understanding and was in agreement with this plan. She also understands that She  can call clinic at any time with any questions, concerns, or complaints.   I spent a total of 45 minutes reviewing chart data, face-to-face evaluation with the patient, counseling and coordination of care as detailed above.  Jane Canary, MD   08/06/2022 4:31 PM

## 2022-08-06 NOTE — Progress Notes (Signed)
Sometimes has headaches on the right side of head, eye and goes down to ear at times. Describes as nagging headache. X3 weeks. Had a feeling of "warm water" running down her left thigh and then woke up with a bruise in that area. Happened a week ago.

## 2022-08-08 LAB — BETA-2-GLYCOPROTEIN I ABS, IGG/M/A
Beta-2 Glyco I IgG: <9 GPI IgG units (ref 0–20)
Beta-2-Glycoprotein I IgA: <9 GPI IgA units (ref 0–25)
Beta-2-Glycoprotein I IgM: <9 GPI IgM units (ref 0–32)

## 2022-08-08 LAB — PROTEIN C, TOTAL: Protein C, Total: 126 % (ref 60–150)

## 2022-08-08 LAB — ANTITHROMBIN III: AntiThromb III Func: 100 % (ref 75–120)

## 2022-08-09 LAB — HEX PHASE PHOSPHOLIPID REFLEX

## 2022-08-09 LAB — HEXAGONAL PHASE PHOSPHOLIPID: Hex Phosph Neut Test: 8 s (ref 0–11)

## 2022-08-09 LAB — PROTEIN C ACTIVITY: Protein C Activity: 186 % — ABNORMAL HIGH (ref 73–180)

## 2022-08-09 LAB — PROTEIN S, TOTAL: Protein S Ag, Total: 100 % (ref 60–150)

## 2022-08-09 LAB — PROTEIN S ACTIVITY: Protein S Activity: 118 % (ref 63–140)

## 2022-08-10 LAB — CARDIOLIPIN ANTIBODIES, IGG, IGM, IGA
Anticardiolipin IgA: <9 APL U/mL (ref 0–11)
Anticardiolipin IgG: <9 GPL U/mL (ref 0–14)
Anticardiolipin IgM: <9 MPL U/mL (ref 0–12)

## 2022-08-11 LAB — PROTHROMBIN GENE MUTATION

## 2022-08-13 LAB — FACTOR 5 LEIDEN

## 2022-08-17 ENCOUNTER — Ambulatory Visit: Payer: Medicare Other | Admitting: Family Medicine

## 2022-08-17 DIAGNOSIS — I10 Essential (primary) hypertension: Secondary | ICD-10-CM

## 2022-08-18 ENCOUNTER — Ambulatory Visit (INDEPENDENT_AMBULATORY_CARE_PROVIDER_SITE_OTHER): Payer: Medicare Other | Admitting: Psychiatry

## 2022-08-18 ENCOUNTER — Encounter: Payer: Self-pay | Admitting: Psychiatry

## 2022-08-18 VITALS — BP 169/74 | HR 69 | Temp 98.1°F | Ht 60.0 in | Wt 144.6 lb

## 2022-08-18 DIAGNOSIS — F330 Major depressive disorder, recurrent, mild: Secondary | ICD-10-CM

## 2022-08-18 DIAGNOSIS — G2581 Restless legs syndrome: Secondary | ICD-10-CM

## 2022-08-18 DIAGNOSIS — F3342 Major depressive disorder, recurrent, in full remission: Secondary | ICD-10-CM | POA: Diagnosis not present

## 2022-08-18 NOTE — Progress Notes (Unsigned)
[facility] MD OP Progress Note  08/18/2022 5:08 PM Carolyn Campbell  MRN:  [id number]  Chief Complaint:  Chief Complaint  Patient presents with   Follow-up   Medication Refill   Anxiety   HPI: Carolyn Campbell is a 66 year old African-American female, has a history of MDD, REM sleep behavior, restless leg symptoms, CVA, hypothyroidism, Crohn's disease, hypertension, pulmonary embolism on Eliquis, disorder, recent acute encephalopathy, with recent hospitalization on 07/22/2022, was evaluated in office today.  Patient last seen Dr. Hisada-04/22/2022, 06/03/2022.  Patient transitioned to this provider.  Patient today appeared to be alert, oriented to person place time situation.  Patient was able to do calculation, serial threes well.  Attention and focus and seems to be good.  Patient's 3 word memory immediate 3 out of 3, after 5 minutes 3 out of 3.  Patient reports currently mood symptoms are stable on the current medication regimen.  Denies any significant depression or anxiety.  Continues to struggle with acting out episodes and sleep.  She reports she is not aware of it or even when she is aware of it does not really affect her sleep.  She does have upcoming sleep study scheduled on December 31.  Currently follows up with neurology.  Patient currently compliant on the sertraline.  Denies side effects.  Denies any suicidality, homicidality or perceptual disturbances.  Patient denies any other concerns today.  Visit Diagnosis:    ICD-10-CM   1. MDD (major depressive disorder), recurrent episode, mild ([place])  F33.0     2. Restless leg syndrome  G25.81       Past Psychiatric History: Patient was under the care of Dr. Selinda Flavin appointment 06/03/2022-at [facility].  Patient denies any history of suicide attempts.  Denies inpatient behavioral health admissions.  Past Medical History: Patient with recent seizure-like spells currently under the care of neurology. Past Medical History:  Diagnosis Date    Abdominal pain, epigastric    Allergic rhinitis    Arthritis    Asthma    Breast cancer ([place]) 2006   LT LUMPECTOMY   Clotting disorder ([place])    Cognitive deficits as late effect of cerebrovascular disease    COPD (chronic obstructive pulmonary disease) ([place])    COVID-19 virus infection 04/2021   Crohn's disease ([place]) 05/21/2015   FOLLOWED BY GI   Crohn's disease of both small and large intestine with rectal bleeding ([place]) 12/04/2014   Depression    Currently taking zoloft.   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 05/2021 Echo: EF 60-65%, no rwma, nl RV fxn.   History of kidney stones    Hypercholesterolemia 01/18/2007   Hyperlipidemia    Hypertension    Hypothyroidism    Low back pain radiating to left leg 06/17/2021   Nonobstructive CAD (coronary artery disease)    a. 10/2012 Cath: diffuse minor irregs-->med rx; b. 08/2021 Cor CTA: Ca2+ = 55.6 (77th%'ile), LAD 25p, LCX <25p. No signif non-cardiac findings.   Occlusion, cerebral artery    NOS w/infarction   Osteoporosis    Overweight (BMI 25.0-29.9) 07/17/2022   Personal history of radiation therapy 2006   BREAST CA   Pulmonary embolism ([place])    a.04/2021 CTA Chest: Small filling defects are noted in upper and lower lobe branches of L PA-->acute PE-->eliquis. *  PE occurred in context of COVID infxn.   Stroke [facility])    Ulcer     Past Surgical History:  Procedure Laterality Date   BREAST BIOPSY Left 2006   POS   BREAST LUMPECTOMY Left 09/20/2004   positive   BREAST SURGERY Left    malignant biopsy   CARDIAC CATHETERIZATION     COLONOSCOPY  2015   COLONOSCOPY WITH PROPOFOL N/A 06/08/2017   Procedure: COLONOSCOPY WITH PROPOFOL;  Surgeon: Lin Landsman, MD;  Location: [facility];  Service: Gastroenterology;  Laterality: N/A;    COLONOSCOPY WITH PROPOFOL N/A 03/08/2019   Procedure: COLONOSCOPY WITH PROPOFOL;  Surgeon: Lin Landsman, MD;  Location: [facility] ENDOSCOPY;  Service: Gastroenterology;  Laterality: N/A;   COLONOSCOPY WITH PROPOFOL N/A 07/16/2020   Procedure: COLONOSCOPY WITH PROPOFOL;  Surgeon: Lin Landsman, MD;  Location: [facility] ENDOSCOPY;  Service: Gastroenterology;  Laterality: N/A;   ESOPHAGOGASTRODUODENOSCOPY  2015   ESOPHAGOGASTRODUODENOSCOPY (EGD) WITH PROPOFOL N/A 06/08/2017   Procedure: ESOPHAGOGASTRODUODENOSCOPY (EGD) WITH PROPOFOL;  Surgeon: Lin Landsman, MD;  Location: [facility];  Service: Gastroenterology;  Laterality: N/A;   ESOPHAGOGASTRODUODENOSCOPY (EGD) WITH PROPOFOL N/A 03/08/2019   Procedure: ESOPHAGOGASTRODUODENOSCOPY (EGD) WITH PROPOFOL;  Surgeon: Lin Landsman, MD;  Location: [facility] ENDOSCOPY;  Service: Gastroenterology;  Laterality: N/A;   FINGER SURGERY     Right small finger   FRACTURE SURGERY     GIVENS CAPSULE STUDY  2015   TUBAL LIGATION      Family Psychiatric History: As noted below.  Denies any history of mental health problems in her family.  Family History:  Family History  Problem Relation Age of Onset   Heart disease Mother    Heart attack Mother    Cerebrovascular Accident Father    Arthritis Father    Hypertension Father    Dementia Father    Breast cancer Sister    Breast cancer Sister 5   Cancer Sister    Hypertension Other    Diabetes Other    Heart attack Other 35   Mental illness Neg Hx     Social History: Reviewed social history from Dr. Ivor Reining notes-04/22/2022.  Patient, currently lives with her husband in [place].  8 sisters, 1 brother.  She has a daughter and a son both in their 41s.  Patient currently retired. Social History   Socioeconomic History   Marital status: Married    Spouse name: javis   Number of children: 2   Years of education: Not on file   Highest education level: Some college, no degree   Occupational History   Not on file  Tobacco Use   Smoking status: Never   Smokeless tobacco: Never  Vaping Use   Vaping Use: Never used  Substance and Sexual Activity   Alcohol use: No    Alcohol/week: 0.0 standard drinks of alcohol   Drug use: No   Sexual activity: Yes    Partners: Male    Birth control/protection: None  Other Topics Concern   Not on file  Social History Narrative   Married. One son and one daughter. She is disabled after she had breast cancer and strokes. 2 caffeinated beverages daily.   Social Determinants of Health   Financial Resource Strain: Low Risk  (11/06/2019)   Overall Financial Resource Strain (CARDIA)    Difficulty of Paying Living Expenses: Not hard at all  Food Insecurity: No Food Insecurity (07/20/2022)   Hunger Vital Sign    Worried About Running Out of Food  in the Last Year: Never true    [place] in the Last Year: Never true  Transportation Needs: No Transportation Needs (07/20/2022)   PRAPARE - Hydrologist (Medical): No    Lack of Transportation (Non-Medical): No  Physical Activity: Insufficiently Active (11/06/2019)   Exercise Vital Sign    Days of Exercise per Week: 2 days    Minutes of Exercise per Session: 30 min  Stress: No Stress Concern Present (11/06/2019)   [place]    Feeling of Stress : Not at all  Social Connections: Moderately Integrated (11/06/2019)   Social Connection and Isolation Panel [NHANES]    Frequency of Communication with Friends and Family: More than three times a week    Frequency of Social Gatherings with Friends and Family: Three times a week    Attends Religious Services: More than 4 times per year    Active Member of Clubs or Organizations: No    Attends Archivist Meetings: Never    Marital Status: Married    Allergies: No Known Allergies  Metabolic Disorder Labs: No results found for:  "HGBA1C", "MPG" No results found for: "PROLACTIN" Lab Results  Component Value Date   CHOL 193 01/29/2021   TRIG 91 01/29/2021   HDL 89 01/29/2021   CHOLHDL 2.2 01/29/2021   LDLCALC 85 01/29/2021   LDLCALC 92 09/03/2020   Lab Results  Component Value Date   TSH 6.178 (H) 07/23/2022   TSH 1.05 08/04/2021    Therapeutic Level Labs: No results found for: "LITHIUM" No results found for: "VALPROATE" No results found for: "CBMZ"  Current Medications: Current Outpatient Medications  Medication Sig Dispense Refill   apixaban (ELIQUIS) 5 MG TABS tablet Take 1 tablet (5 mg total) by mouth 2 (two) times daily. 60 tablet 1   brimonidine (ALPHAGAN) 0.2 % ophthalmic solution Place 1 drop into both eyes in the morning and at bedtime.     latanoprost (XALATAN) 0.005 % ophthalmic solution Place 1 drop into both eyes at bedtime.      levothyroxine (SYNTHROID) 75 MCG tablet Take 1 tablet (75 mcg total) by mouth daily before breakfast. 90 tablet 0   losartan (COZAAR) 50 MG tablet Take 1 tablet (50 mg total) by mouth daily. 30 tablet 1   Multiple Vitamin (MULTIVITAMIN) tablet Take 1 tablet by mouth daily.     rosuvastatin (CRESTOR) 40 MG tablet Take 1 tablet (40 mg total) by mouth daily. 90 tablet 0   sertraline (ZOLOFT) 50 MG tablet Take 1 tablet (50 mg total) by mouth daily. 30 tablet 2   No current facility-administered medications for this visit.     Musculoskeletal: Strength & Muscle Tone: within normal limits Gait & Station: normal Patient leans: N/A  Psychiatric Specialty Exam: Review of Systems  Psychiatric/Behavioral:  Positive for sleep disturbance.   All other systems reviewed and are negative.   Blood pressure (!) 169/74, pulse 69, temperature 98.1 F (36.7 C), temperature source Oral, height 5' (1.524 m), weight 144 lb 9.6 oz (65.6 kg), SpO2 94 %.Body mass index is 28.24 kg/m.  General Appearance: Casual  Eye Contact:  Fair  Speech:  Clear and Coherent  Volume:  Normal   Mood:  Euthymic  Affect:  Congruent  Thought Process:  Goal Directed and Descriptions of Associations: Intact  Orientation:  Full (Time, Place, and Person)  Thought Content: Logical   Suicidal Thoughts:  No  Homicidal Thoughts:  No  Memory:  Immediate;   Fair Recent;   Fair Remote;   Limited  Judgement:  Fair  Insight:  Fair  Psychomotor Activity:  Normal  Concentration:  Concentration: Fair and Attention Span: Fair  Recall:  [employer] of Knowledge: Fair  Language: Fair  Akathisia:  No  Handed:  Right  AIMS (if indicated): not done  Assets:  Communication Skills Desire for Improvement Housing Intimacy Social Support  ADL's:  Intact  Cognition: WNL  Sleep:   does report acting out in sleep   Screenings: [place] Office Visit from 08/18/2022 in [facility] Office Visit from 06/03/2022 in [place] Office Visit from 04/22/2022 in [facility] from 01/29/2021 in [facility] Office Visit from 09/03/2020 in [facility]  Total GAD-7 Score 0 6 5 0 1      PHQ2-9    [place] Visit from 08/18/2022 in [place] from 08/03/2022 in [facility] Office Visit from 06/03/2022 in [facility] from 04/22/2022 in [place] from 01/01/2022 in [facility]  PHQ-2 Total Score 0 0 1 2 0  PHQ-9 Total Score 3 0 5 6 [place] from 08/18/2022 in [facility] ED to [place] (Discharged) from 07/19/2022 in [facility] ED to [facility]mission (Discharged) from 07/16/2022 in [facility]U  C-SSRS RISK CATEGORY No Risk No Risk No Risk        Assessment and  Plan: Carolyn Campbell is a 66 year old female with history of depression, REM sleep behavior disorder, multifactorial cognitive impairment, history of stroke in 2008, breast cancer status post left lumpectomy 2006, prediabetes, mild sleep apnea currently not on CPAP, recent acute encephalopathy, pulmonary embolism, was evaluated in office today.  Patient currently stable with regards to her mood however continues to have behavioral problems in her sleep, will benefit from the following plan.  Plan MDD-in remission Sertraline 50 mg p.o. daily Could consider referral for CBT if patient is interested.  This was discussed with patient  Restless leg syndrome-improving Patient with REM sleep behavior disorder, currently under the care of neurology.  She has upcoming sleep study scheduled for December 31. Patient had wise to continue to follow-up with neurology. Currently not on clonazepam, this medication was discontinued recently per neurology and was started on rivastigmine per review of notes per Dr. Pecola Lawless 07/15/22.  Patient reports rivastigmine was also discontinued at her recent hospitalization.  Patient to discuss this with neurology.  Reviewed labs-most recent TSH-dated 07/23/2022-elevated.  Patient advised to contact primary care provider for management.  I have reviewed notes per Dr.Hisada - 06/03/22, 04/22/22 as noted above.  Follow-up in clinic in 3 months or sooner if needed.   This note was generated in part or whole with voice recognition software. Voice recognition is usually quite accurate but there are transcription errors that can and very often do occur. I apologize for any typographical errors that were not detected and corrected.      Ursula Alert, MD 08/18/2022, 5:08 PM

## 2022-08-20 ENCOUNTER — Inpatient Hospital Stay: Payer: Medicare Other | Attending: Internal Medicine | Admitting: Nurse Practitioner

## 2022-08-20 ENCOUNTER — Encounter: Payer: Self-pay | Admitting: Nurse Practitioner

## 2022-08-20 VITALS — BP 157/66 | HR 69 | Temp 98.7°F | Ht 60.0 in | Wt 143.0 lb

## 2022-08-20 DIAGNOSIS — I10 Essential (primary) hypertension: Secondary | ICD-10-CM | POA: Insufficient documentation

## 2022-08-20 DIAGNOSIS — I2699 Other pulmonary embolism without acute cor pulmonale: Secondary | ICD-10-CM

## 2022-08-20 DIAGNOSIS — Z923 Personal history of irradiation: Secondary | ICD-10-CM | POA: Insufficient documentation

## 2022-08-20 DIAGNOSIS — E039 Hypothyroidism, unspecified: Secondary | ICD-10-CM | POA: Diagnosis not present

## 2022-08-20 DIAGNOSIS — Z7901 Long term (current) use of anticoagulants: Secondary | ICD-10-CM

## 2022-08-20 DIAGNOSIS — Z853 Personal history of malignant neoplasm of breast: Secondary | ICD-10-CM | POA: Diagnosis not present

## 2022-08-20 DIAGNOSIS — J4489 Other specified chronic obstructive pulmonary disease: Secondary | ICD-10-CM | POA: Insufficient documentation

## 2022-08-20 DIAGNOSIS — Z8673 Personal history of transient ischemic attack (TIA), and cerebral infarction without residual deficits: Secondary | ICD-10-CM | POA: Diagnosis not present

## 2022-08-20 DIAGNOSIS — I2510 Atherosclerotic heart disease of native coronary artery without angina pectoris: Secondary | ICD-10-CM | POA: Insufficient documentation

## 2022-08-20 DIAGNOSIS — E7800 Pure hypercholesterolemia, unspecified: Secondary | ICD-10-CM | POA: Insufficient documentation

## 2022-08-20 DIAGNOSIS — K5090 Crohn's disease, unspecified, without complications: Secondary | ICD-10-CM | POA: Diagnosis not present

## 2022-08-20 DIAGNOSIS — Z5181 Encounter for therapeutic drug level monitoring: Secondary | ICD-10-CM | POA: Diagnosis not present

## 2022-08-20 DIAGNOSIS — Z86711 Personal history of pulmonary embolism: Secondary | ICD-10-CM | POA: Insufficient documentation

## 2022-08-20 NOTE — Progress Notes (Signed)
[place]  [phone] [phone]  ID: Carolyn Campbell OB: 01/14/1956  MR#: [id number]  PIR#:[id number]  Patient Care Team: Delsa Grana, PA-C as PCP - General (Family Medicine) Rockey Situ Kathlene November, MD as PCP - Cardiology (Cardiology) Lin Landsman, MD as Consulting Physician (Gastroenterology) Idelle Leech, OD as Consulting Physician (Optometry) Beverly Gust, MD as Consulting Physician (Otolaryngology)  REFERRING PROVIDER: Delsa Grana  REASON FOR REFERRAL: unprovoked acute PE  HPI: Carolyn Campbell is a 66 y.o. female with past medical history of COPD, COVID, Crohn's, hyperlipidemia, hypertension, hypothyroidism, CAD, stroke, depression was referred to hematology with history of recurrent PE.  Patient was admitted in October 2023 for shortness of breath.  CTA chest from 07/19/2022 showed small clot in the lower lobe branch of right pulmonary artery with mild right heart strain. At that time she also had episode of catatonia and loss of consciousness.  She was evaluated by neurology and psychiatry with EEG.  There was concern for behavioral disorder. Patient denies any history of recent surgery, long distance flights or OCP use.  Patient had PE in August 2022 which was attributed to her recent COVID infection.  She was anticoagulated for 3 months.  She is currently on Eliquis and tolerating well.   Interval History: Patient is 66 year old female who returns to clinic for discussion of lab results. She continues Eliquis. Says she's tolerating well without significant side effects. Reports compliance with medication. Denies abnormal bleeding or bruising.    REVIEW OF SYSTEMS:   Review of Systems  Constitutional:  Negative for chills, fever, malaise/fatigue and weight loss.  HENT:  Negative for hearing loss, nosebleeds, sore throat and tinnitus.   Eyes:  Negative for blurred vision and double vision.  Respiratory:  Negative for cough,  hemoptysis, shortness of breath and wheezing.   Cardiovascular:  Negative for chest pain, palpitations and leg swelling.  Gastrointestinal:  Positive for abdominal pain. Negative for blood in stool, constipation, diarrhea, melena, nausea and vomiting.  Genitourinary:  Negative for dysuria and urgency.  Musculoskeletal:  Negative for back pain, falls, joint pain and myalgias.  Skin:  Negative for itching and rash.  Neurological:  Negative for dizziness, tingling, sensory change, loss of consciousness, weakness and headaches.  Endo/Heme/Allergies:  Negative for environmental allergies. Does not bruise/bleed easily.  Psychiatric/Behavioral:  Negative for depression. The patient is not nervous/anxious and does not have insomnia.   As per HPI. Otherwise, a complete review of systems is negative.  PAST MEDICAL HISTORY: Past Medical History:  Diagnosis Date   Abdominal pain, epigastric    Allergic rhinitis    Arthritis    Asthma    Breast cancer ([place]) 2006   LT LUMPECTOMY   Clotting disorder ([place])    Cognitive deficits as late effect of cerebrovascular disease    COPD (chronic obstructive pulmonary disease) ([place])    COVID-19 virus infection 04/2021   Crohn's disease ([place]) 05/21/2015   FOLLOWED BY GI   Crohn's disease of both small and large intestine with rectal bleeding ([place]) 12/04/2014   Depression    Currently taking zoloft.   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 05/2021 Echo: EF 60-65%, no rwma, nl RV fxn.   History of kidney stones    Hypercholesterolemia 01/18/2007  Hyperlipidemia    Hypertension    Hypothyroidism    Low back pain radiating to left leg 06/17/2021   Nonobstructive CAD (coronary artery disease)    a. 10/2012 Cath: diffuse minor irregs-->med rx; b. 08/2021 Cor CTA: Ca2+ = 55.6  (77th%'ile), LAD 25p, LCX <25p. No signif non-cardiac findings.   Occlusion, cerebral artery    NOS w/infarction   Osteoporosis    Overweight (BMI 25.0-29.9) 07/17/2022   Personal history of radiation therapy 2006   BREAST CA   Pulmonary embolism ([place])    a.04/2021 CTA Chest: Small filling defects are noted in upper and lower lobe branches of L PA-->acute PE-->eliquis. *PE occurred in context of COVID infxn.   Stroke [facility])    Ulcer     PAST SURGICAL HISTORY: Past Surgical History:  Procedure Laterality Date   BREAST BIOPSY Left 2006   POS   BREAST LUMPECTOMY Left 09/20/2004   positive   BREAST SURGERY Left    malignant biopsy   CARDIAC CATHETERIZATION     COLONOSCOPY  2015   COLONOSCOPY WITH PROPOFOL N/A 06/08/2017   Procedure: COLONOSCOPY WITH PROPOFOL;  Surgeon: Lin Landsman, MD;  Location: [facility];  Service: Gastroenterology;  Laterality: N/A;   COLONOSCOPY WITH PROPOFOL N/A 03/08/2019   Procedure: COLONOSCOPY WITH PROPOFOL;  Surgeon: Lin Landsman, MD;  Location: [facility] ENDOSCOPY;  Service: Gastroenterology;  Laterality: N/A;   COLONOSCOPY WITH PROPOFOL N/A 07/16/2020   Procedure: COLONOSCOPY WITH PROPOFOL;  Surgeon: Lin Landsman, MD;  Location: [facility] ENDOSCOPY;  Service: Gastroenterology;  Laterality: N/A;   ESOPHAGOGASTRODUODENOSCOPY  2015   ESOPHAGOGASTRODUODENOSCOPY (EGD) WITH PROPOFOL N/A 06/08/2017   Procedure: ESOPHAGOGASTRODUODENOSCOPY (EGD) WITH PROPOFOL;  Surgeon: Lin Landsman, MD;  Location: [place];  Service: Gastroenterology;  Laterality: N/A;   ESOPHAGOGASTRODUODENOSCOPY (EGD) WITH PROPOFOL N/A 03/08/2019   Procedure: ESOPHAGOGASTRODUODENOSCOPY (EGD) WITH PROPOFOL;  Surgeon: Lin Landsman, MD;  Location: [facility] ENDOSCOPY;  Service: Gastroenterology;  Laterality: N/A;   FINGER SURGERY     Right small finger   FRACTURE SURGERY     GIVENS CAPSULE STUDY  2015   TUBAL LIGATION      FAMILY HISTORY: Family History   Problem Relation Age of Onset   Heart disease Mother    Heart attack Mother    Cerebrovascular Accident Father    Arthritis Father    Hypertension Father    Dementia Father    Breast cancer Sister    Breast cancer Sister 85   Cancer Sister    Hypertension Other    Diabetes Other    Heart attack Other 74   Mental illness Neg Hx     HEALTH MAINTENANCE: Social History   Tobacco Use   Smoking status: Never   Smokeless tobacco: Never  Vaping Use   Vaping Use: Never used  Substance Use Topics   Alcohol use: No    Alcohol/week: 0.0 standard drinks of alcohol   Drug use: No    No Known Allergies  Current Outpatient Medications  Medication Sig Dispense Refill   apixaban (ELIQUIS) 5 MG TABS tablet Take 1 tablet (5 mg total) by mouth 2 (two) times daily. 60 tablet 1   brimonidine (ALPHAGAN) 0.2 % ophthalmic solution Place 1 drop into both eyes in the morning and at bedtime.     latanoprost (XALATAN) 0.005 % ophthalmic solution Place 1 drop into both eyes at bedtime.      levothyroxine (SYNTHROID) 75 MCG tablet Take 1 tablet (75 mcg total) by mouth  daily before breakfast. 90 tablet 0   losartan (COZAAR) 50 MG tablet Take 1 tablet (50 mg total) by mouth daily. 30 tablet 1   Multiple Vitamin (MULTIVITAMIN) tablet Take 1 tablet by mouth daily.     rosuvastatin (CRESTOR) 40 MG tablet Take 1 tablet (40 mg total) by mouth daily. 90 tablet 0   sertraline (ZOLOFT) 50 MG tablet Take 1 tablet (50 mg total) by mouth daily. 30 tablet 2   No current facility-administered medications for this visit.    OBJECTIVE: Vitals:   08/20/22 1320  BP: (!) 157/66  Pulse: 69  Temp: 98.7 F (37.1 C)     Body mass index is 27.93 kg/m.      General: Well-developed, well-nourished, no acute distress. Eyes: Pink conjunctiva, anicteric sclera. Lungs: Clear to auscultation bilaterally.  No audible wheezing or coughing Heart: Regular rate and rhythm.  Abdomen: Soft, nontender, nondistended.   Musculoskeletal: No edema, cyanosis, or clubbing. Cane.  Neuro: Alert, answering all questions appropriately. Cranial nerves grossly intact. Skin: No rashes or petechiae noted. Psych: Normal affect.    LAB RESULTS:  Lab Results  Component Value Date   NA 140 07/23/2022   K 3.5 07/23/2022   CL 107 07/23/2022   CO2 26 07/23/2022   GLUCOSE 102 (H) 07/23/2022   BUN 12 07/23/2022   CREATININE 0.76 07/23/2022   CALCIUM 9.5 07/23/2022   PROT 6.4 (L) 07/23/2022   ALBUMIN 3.3 (L) 07/23/2022   AST 20 07/23/2022   ALT 13 07/23/2022   ALKPHOS 50 07/23/2022   BILITOT 0.7 07/23/2022   GFRNONAA >60 07/23/2022   GFRAA 78 01/29/2021    Lab Results  Component Value Date   WBC 6.4 07/23/2022   NEUTROABS 3.6 07/23/2022   HGB 11.2 (L) 07/23/2022   HCT 34.6 (L) 07/23/2022   MCV 89.4 07/23/2022   PLT 212 07/23/2022    Lab Results  Component Value Date   TIBC 330 10/17/2019   TIBC 378 01/17/2018   FERRITIN 66 04/23/2022   FERRITIN 76 10/17/2019   FERRITIN 56 01/17/2018   IRONPCTSAT 26 10/17/2019   IRONPCTSAT 14 01/17/2018     STUDIES: Overnight EEG with video  Result Date: 07/23/2022 Lora Havens, MD     07/24/2022  9:23 AM Patient Name: Carolyn Campbell MRN: [id number] Epilepsy Attending: Lora Havens Referring Physician/Provider: Amie Portland, MD Duration: 07/23/2022 0310 to 07/24/2022 0310 Patient history: 66yo F with ams. EEG to evaluate for seizure Level of alertness: Awake, asleep AEDs during EEG study: None Technical aspects: This EEG study was done with scalp electrodes positioned according to the 10-20 International system of electrode placement. Electrical activity was reviewed with band pass filter of 1-70Hz , sensitivity of 7 uV/mm, display speed of 22m/sec with a 60Hz  notched filter applied as appropriate. EEG data were recorded continuously and digitally stored.  Video monitoring was available and reviewed as appropriate. Description: The posterior dominant rhythm  consists of 8-9 Hz activity of moderate voltage (25-35 uV) seen predominantly in posterior head regions, symmetric and reactive to eye opening and eye closing. Sleep was characterized by vertex waves, sleep spindles (12 to 14 Hz), maximal frontocentral region. Hyperventilation and photic stimulation were not performed.   At 1156 on 07/23/2022, patient's family notified RN that patient was zoning out. Concomitant eeg before, during and after the event didn't show any eeg change to suggest seizure. IMPRESSION: This study is within normal limits. No seizures or epileptiform discharges were seen throughout the recording. At 1156 on  07/23/2022, patient's family notified RN that patient was zoning out.. Concomitant eeg before, during and after the event didn't show any eeg change to suggest seizure.  This was most likely NOT an epileptic event. Priyanka Barbra Sarks    ASSESSMENT AND PLAN:   Carolyn Campbell is a 66 y.o. female   Acute PE- unprovoked. October 2023. Hx of PE in August 2022 thought to be related to covid. Hypercoaguable workup with Dr. Darrall Dears was reviewed today in detail and workup was either negative or unremarkable. [place] of BLE was negative. Given that she has now had second clot, would recommend life long anticoagulation. She is agreeable. Advised that she may need pre-op clearance prior to procedures such as colonoscopy, eyelid surgery, etc. Tolerating eliquis well. She is high risk of bleeding particularly in setting of underlying crohns. Will monitor closely. Advised to notify clinic if black or bloody stools.  Disposition:  3 mo- lab (cbc, cmp), Dr Darrall Dears- [place]  No orders of the defined types were placed in this encounter.  Patient expressed understanding and was in agreement with this plan. She also understands that She can call clinic at any time with any questions, concerns, or complaints.   I spent a total of 30 minutes reviewing chart data, face-to-face evaluation with the patient, counseling  and coordination of care as detailed above.  Thank you for allowing me to participate in the care of this pleasant patient.   Verlon Au, NP  08/20/2022

## 2022-08-23 ENCOUNTER — Ambulatory Visit (INDEPENDENT_AMBULATORY_CARE_PROVIDER_SITE_OTHER): Payer: Medicare Other | Admitting: Physician Assistant

## 2022-08-23 ENCOUNTER — Encounter: Payer: Self-pay | Admitting: Physician Assistant

## 2022-08-23 VITALS — BP 152/70 | HR 85 | Temp 97.9°F | Resp 16 | Ht 60.0 in | Wt 144.4 lb

## 2022-08-23 DIAGNOSIS — R002 Palpitations: Secondary | ICD-10-CM | POA: Diagnosis not present

## 2022-08-23 DIAGNOSIS — I10 Essential (primary) hypertension: Secondary | ICD-10-CM

## 2022-08-23 MED ORDER — HYDROCHLOROTHIAZIDE 12.5 MG PO CAPS: 12.5000 mg | ORAL_CAPSULE | Freq: Every day | ORAL | 1 refills | Status: DC

## 2022-08-23 NOTE — Progress Notes (Signed)
Established Patient Office Visit  Name: Carolyn Campbell   MRN: [id number]    DOB: Sep 11, 1956   Date:08/23/2022  Today's Provider: Talitha Givens, MHS, PA-C Introduced myself to the patient as a PA-C and provided education on APPs in clinical practice.         Subjective  Chief Complaint  Chief Complaint  Patient presents with   Follow-up   Hypertension    Average at home systolic [id number]    HPI    Hypertension: - Medications: Losartan 50 mg PO QD  - Compliance: good compliance  - Checking BP at home: She is checking approx 2 times per day most days, usually 140s-150s/ 60s-70s   - Denies any  CP, vision changes, LE edema, medication SEs, or symptoms of hypotension Reports SOB, palpitations, dizziness if she is up and walking a lot or going up stairs  - Diet: She is not following a particular diet at this time  - Exercise: Exercise ability is limited due to above symptoms- she tries to do leg strengthening exercises provided by PT and does chair exercises as well        Patient Active Problem List   Diagnosis Date Noted   MDD (major depressive disorder), recurrent episode, mild ([place]) 08/18/2022   Restless leg syndrome 23/55/7322   Acute metabolic encephalopathy 02/54/2706   Acute encephalopathy 07/22/2022   Pulmonary embolism ([place]) 07/16/2022   Pain in limb 11/10/2021   Nonobstructive CAD (coronary artery disease) 08/06/2021   Other long term (current) drug therapy 01/22/2021   Varicose veins of both lower extremities with pain 07/19/2018   Arthritis of right hand 12/21/2017   Major depression in remission ([place]) 12/21/2017   Fibrocystic breast changes 06/22/2017   Osteopenia 10/18/2016   Atherosclerosis of aorta ([place]) 12/19/2015   Vitreous degeneration 05/21/2015   Glaucoma associated with chamber angle anomalies 05/21/2015   H/O malignant neoplasm of breast 05/21/2015   Crohn's disease of both small and large intestine with rectal bleeding ([place]) 12/04/2014    Solitary pulmonary nodule 11/07/2012   Cognitive deficits as late effect of cerebrovascular disease 01/20/2010   CVA, old, hemiparesis ([place]) 04/28/2009   Acquired hypothyroidism 06/12/2008   HLD (hyperlipidemia) 01/18/2007   Essential hypertension 01/18/2007    Past Surgical History:  Procedure Laterality Date   BREAST BIOPSY Left 2006   POS   BREAST LUMPECTOMY Left 09/20/2004   positive   BREAST SURGERY Left    malignant biopsy   CARDIAC CATHETERIZATION     COLONOSCOPY  2015   COLONOSCOPY WITH PROPOFOL N/A 06/08/2017   Procedure: COLONOSCOPY WITH PROPOFOL;  Surgeon: Lin Landsman, MD;  Location: [facility];  Service: Gastroenterology;  Laterality: N/A;   COLONOSCOPY WITH PROPOFOL N/A 03/08/2019   Procedure: COLONOSCOPY WITH PROPOFOL;  Surgeon: Lin Landsman, MD;  Location: [facility] ENDOSCOPY;  Service: Gastroenterology;  Laterality: N/A;   COLONOSCOPY WITH PROPOFOL N/A 07/16/2020   Procedure: COLONOSCOPY WITH PROPOFOL;  Surgeon: Lin Landsman, MD;  Location: [facility] ENDOSCOPY;  Service: Gastroenterology;  Laterality: N/A;   ESOPHAGOGASTRODUODENOSCOPY  2015   ESOPHAGOGASTRODUODENOSCOPY (EGD) WITH PROPOFOL N/A 06/08/2017   Procedure: ESOPHAGOGASTRODUODENOSCOPY (EGD) WITH PROPOFOL;  Surgeon: Lin Landsman, MD;  Location: [place];  Service: Gastroenterology;  Laterality: N/A;   ESOPHAGOGASTRODUODENOSCOPY (EGD) WITH PROPOFOL N/A 03/08/2019   Procedure: ESOPHAGOGASTRODUODENOSCOPY (EGD) WITH PROPOFOL;  Surgeon: Lin Landsman, MD;  Location: [facility] ENDOSCOPY;  Service: Gastroenterology;  Laterality: N/A;   FINGER SURGERY  Right small finger   FRACTURE SURGERY     GIVENS CAPSULE STUDY  2015   TUBAL LIGATION      Family History  Problem Relation Age of Onset   Heart disease Mother    Heart attack Mother    Cerebrovascular Accident Father    Arthritis Father    Hypertension Father    Dementia Father    Breast cancer Sister    Breast cancer  Sister 38   Cancer Sister    Hypertension Other    Diabetes Other    Heart attack Other 89   Mental illness Neg Hx     Social History   Tobacco Use   Smoking status: Never   Smokeless tobacco: Never  Substance Use Topics   Alcohol use: No    Alcohol/week: 0.0 standard drinks of alcohol     Current Outpatient Medications:    apixaban (ELIQUIS) 5 MG TABS tablet, Take 1 tablet (5 mg total) by mouth 2 (two) times daily., Disp: 60 tablet, Rfl: 1   brimonidine (ALPHAGAN) 0.2 % ophthalmic solution, Place 1 drop into both eyes in the morning and at bedtime., Disp: , Rfl:    hydrochlorothiazide (MICROZIDE) 12.5 MG capsule, Take 1 capsule (12.5 mg total) by mouth daily., Disp: 30 capsule, Rfl: 1   latanoprost (XALATAN) 0.005 % ophthalmic solution, Place 1 drop into both eyes at bedtime. , Disp: , Rfl:    levothyroxine (SYNTHROID) 75 MCG tablet, Take 1 tablet (75 mcg total) by mouth daily before breakfast., Disp: 90 tablet, Rfl: 0   losartan (COZAAR) 50 MG tablet, Take 1 tablet (50 mg total) by mouth daily., Disp: 30 tablet, Rfl: 1   Multiple Vitamin (MULTIVITAMIN) tablet, Take 1 tablet by mouth daily., Disp: , Rfl:    rosuvastatin (CRESTOR) 40 MG tablet, Take 1 tablet (40 mg total) by mouth daily., Disp: 90 tablet, Rfl: 0   sertraline (ZOLOFT) 50 MG tablet, Take 1 tablet (50 mg total) by mouth daily., Disp: 30 tablet, Rfl: 2  No Known Allergies  I personally reviewed active problem list, medication list, allergies, health maintenance, notes from last encounter, lab results with the patient/caregiver today.   Review of Systems  Eyes:  Negative for blurred vision and double vision.  Respiratory:  Positive for shortness of breath. Negative for wheezing.   Cardiovascular:  Positive for palpitations. Negative for chest pain and leg swelling.  Musculoskeletal:  Negative for falls.  Neurological:  Positive for dizziness. Negative for loss of consciousness, weakness and headaches.       Objective  Vitals:   08/23/22 1035  BP: (!) 152/70  Pulse: 85  Resp: 16  Temp: 97.9 F (36.6 C)  TempSrc: Oral  SpO2: 94%  Weight: 144 lb 6.4 oz (65.5 kg)  Height: 5' (1.524 m)    Body mass index is 28.2 kg/m.  Physical Exam Vitals reviewed.  Constitutional:      General: She is awake.     Appearance: Normal appearance. She is well-developed and well-groomed.  HENT:     Head: Normocephalic.  Cardiovascular:     Rate and Rhythm: Normal rate and regular rhythm.     Pulses: Normal pulses.          Radial pulses are 2+ on the right side and 2+ on the left side.     Heart sounds: Normal heart sounds. No murmur heard.    No friction rub. No gallop.  Pulmonary:     Effort: Pulmonary effort is normal.  Breath sounds: No decreased air movement. Examination of the right-upper field reveals decreased breath sounds. Examination of the right-middle field reveals decreased breath sounds. Examination of the right-lower field reveals decreased breath sounds. Decreased breath sounds present. No wheezing, rhonchi or rales.  Musculoskeletal:     Right lower leg: No edema.     Left lower leg: No edema.  Neurological:     Mental Status: She is alert.  Psychiatric:        Attention and Perception: Attention normal.        Mood and Affect: Mood normal. Affect is flat.        Speech: Speech normal.        Behavior: Behavior normal. Behavior is cooperative.     EKG performed today in office EKG demonstrates a rate of ~65 bpm with normal sinus rhythm, no evidence of ST elevations or T wave inversions at this time Today's EKG was compared to previous EKG from 07/19/22 and there are no significant changes      PHQ2/9:    08/23/2022   10:29 AM 08/18/2022    4:59 PM 08/03/2022    1:09 PM 06/03/2022    1:24 PM 04/22/2022    1:07 PM  Depression screen PHQ 2/9  Decreased Interest 0  0    Down, Depressed, Hopeless 0  0    PHQ - 2 Score 0  0    Altered sleeping 0  0    Tired,  decreased energy 0  0    Change in appetite 0  0    Feeling bad or failure about yourself  0  0    Trouble concentrating 0  0    Moving slowly or fidgety/restless 0  0    Suicidal thoughts 0  0    PHQ-9 Score 0  0    Difficult doing work/chores Not difficult at all  Not difficult at all       Information is confidential and restricted. Go to Review Flowsheets to unlock data.      Fall Risk:    08/23/2022   10:29 AM 08/03/2022    1:08 PM 01/01/2022   11:53 AM 12/09/2021    1:38 PM 11/30/2021    3:39 PM  Fall Risk   Falls in the past year? 0 0 0 0 0  Number falls in past yr: 0 0 0 0 0  Injury with Fall? 0 0 0 0 0  Risk for fall due to : No Fall Risks No Fall Risks No Fall Risks No Fall Risks   Follow up Falls prevention discussed;Education provided;Falls evaluation completed Falls prevention discussed;Falls evaluation completed;Education provided Falls prevention discussed;Education provided Falls prevention discussed       Functional Status Survey: Is the patient deaf or have difficulty hearing?: No Does the patient have difficulty seeing, even when wearing glasses/contacts?: No Does the patient have difficulty concentrating, remembering, or making decisions?: No Does the patient have difficulty walking or climbing stairs?: No Does the patient have difficulty dressing or bathing?: No Does the patient have difficulty doing errands alone such as visiting a doctor's office or shopping?: No    Assessment & Plan  Problem List Items Addressed This Visit       Cardiovascular and Mediastinum   Essential hypertension    Chronic, ongoing BP in office and at last 3 apts has been elevated above goal She reports BP measures in 140s/70s at home Will add HCTZ 12.5 mg PO QD to regimen  She is currently taking Losartan 50 mg PO QD and is tolerating well If BP is in better range with addition will provide script for combo pill as she was previously on this prior to recent  hospitalization  Follow up in 4 weeks for monitoring        Relevant Medications   hydrochlorothiazide (MICROZIDE) 12.5 MG capsule   Other Visit Diagnoses     Intermittent palpitations    -  Primary Recurrent, unsure of chronicity  Reports SOB and palpitations with increased activity  EKG in office today did not reveal any changes since previous EKG and no abnormalities Recommend she follow up with cardiology provider  as she has a hx of CAD and atherosclerosis  Will try to get BP under more consistent control with addition of HCTZ 12.5 mg PO QD  Follow up as needed    Relevant Orders   EKG 12-Lead        Return in about 4 weeks (around 09/20/2022) for thyroid and addition of HCTZ to regimen .   I, Vennie Salsbury E Mechel Schutter, PA-C, have reviewed all documentation for this visit. The documentation on 08/23/22 for the exam, diagnosis, procedures, and orders are all accurate and complete.   Talitha Givens, MHS, PA-C [facility]

## 2022-08-23 NOTE — Assessment & Plan Note (Signed)
Chronic, ongoing BP in office and at last 3 apts has been elevated above goal She reports BP measures in 140s/70s at home Will add HCTZ 12.5 mg PO QD to regimen  She is currently taking Losartan 50 mg PO QD and is tolerating well If BP is in better range with addition will provide script for combo pill as she was previously on this prior to recent hospitalization  Follow up in 4 weeks for monitoring

## 2022-08-23 NOTE — Patient Instructions (Addendum)
Please call your Cardiology office and schedule a follow up with them for regular monitoring   I have sent in a script for HCTZ 12.5 mg  to be taken by mouth once per day Please continue with your other medications  If you seem to be doing well with this medication addition we can resume using the Losartan-HCTZ combo pill for blood pressure control Please keep a log of when you have palpitations and shortness of breath so we can see what patterns emerge from that.

## 2022-09-06 ENCOUNTER — Telehealth: Payer: Self-pay

## 2022-09-06 DIAGNOSIS — K5010 Crohn's disease of large intestine without complications: Secondary | ICD-10-CM

## 2022-09-06 NOTE — Telephone Encounter (Signed)
Recommend to check fecal calprotectin levels, CRP and I can see her on 12/21 in [place]  [facility]

## 2022-09-06 NOTE — Telephone Encounter (Signed)
Patient is calling because she has a follow up appointment with you on 10/27/2021 but states for the last month she has been having middle abdominal pain that is sharp. She states it is worse after she eats. She states her stool started to have mucus in them but now is having blood with every bowel movement. She states she has some nausea. She is on a blood thinner now because she had another blood clot in her lungs. She has not been seen since 01/22/21

## 2022-09-07 NOTE — Addendum Note (Signed)
Addended by: Ulyess Blossom L on: 09/07/2022 08:22 AM   Modules accepted: Orders

## 2022-09-07 NOTE — Telephone Encounter (Signed)
Made appointment for 09/08/2022 at 3:15 order lab work and stool. Patient was informed

## 2022-09-08 ENCOUNTER — Other Ambulatory Visit: Payer: Self-pay

## 2022-09-08 ENCOUNTER — Other Ambulatory Visit
Admission: RE | Admit: 2022-09-08 | Discharge: 2022-09-08 | Disposition: A | Discharge status: Home / Self Care | Payer: Medicare Other | Source: Ambulatory Visit | Attending: Gastroenterology | Admitting: Gastroenterology

## 2022-09-08 ENCOUNTER — Ambulatory Visit (INDEPENDENT_AMBULATORY_CARE_PROVIDER_SITE_OTHER): Payer: Medicare Other | Admitting: Gastroenterology

## 2022-09-08 ENCOUNTER — Encounter: Payer: Self-pay | Admitting: Gastroenterology

## 2022-09-08 VITALS — BP 135/72 | HR 72 | Temp 99.0°F | Ht 60.0 in | Wt 141.1 lb

## 2022-09-08 DIAGNOSIS — K5010 Crohn's disease of large intestine without complications: Secondary | ICD-10-CM

## 2022-09-08 DIAGNOSIS — K50911 Crohn's disease, unspecified, with rectal bleeding: Secondary | ICD-10-CM

## 2022-09-08 LAB — CBC
HCT: 38.3 % (ref 36.0–46.0)
Hemoglobin: 12.4 g/dL (ref 12.0–15.0)
MCH: 28.4 pg (ref 26.0–34.0)
MCHC: 32.4 g/dL (ref 30.0–36.0)
MCV: 87.8 fL (ref 80.0–100.0)
Platelets: 231 10*3/uL (ref 150–400)
RBC: 4.36 MIL/uL (ref 3.87–5.11)
RDW: 12.9 % (ref 11.5–15.5)
WBC: 5.5 10*3/uL (ref 4.0–10.5)
nRBC: 0 % (ref 0.0–0.2)

## 2022-09-08 LAB — IRON AND TIBC
Iron: 59 ug/dL (ref 28–170)
Saturation Ratios: 16 % (ref 10.4–31.8)
TIBC: 368 ug/dL (ref 250–450)
UIBC: 309 ug/dL

## 2022-09-08 LAB — C-REACTIVE PROTEIN: CRP: 1.8 mg/dL — ABNORMAL HIGH (ref <1.0)

## 2022-09-08 LAB — FERRITIN: Ferritin: 56 ng/mL (ref 11–307)

## 2022-09-08 MED ORDER — BUDESONIDE 3 MG PO CPEP: 9.0000 mg | ORAL_CAPSULE | Freq: Every day | ORAL | 0 refills | Status: DC

## 2022-09-08 MED ORDER — NA SULFATE-K SULFATE-MG SULF 17.5-3.13-1.6 GM/177ML PO SOLN: 354.0000 mL | Freq: Once | ORAL | 0 refills | Status: AC

## 2022-09-09 ENCOUNTER — Telehealth: Payer: Self-pay

## 2022-09-09 ENCOUNTER — Encounter: Payer: Self-pay | Admitting: Gastroenterology

## 2022-09-09 NOTE — Progress Notes (Signed)
[name], MD [address]  [place]  [place], [place] [address]  Main: [phone]  Fax: [phone]    Gastroenterology Consultation  Referring Provider:     Delsa Grana, PA-C Primary Care Physician:  Delsa Grana, PA-C Primary Gastroenterologist:  Dr. [name] Reason for Consultation: Crohn's Disease, flareup        HPI:   Carolyn Campbell is a 66 y.o. African-American female referred by Delsa Grana, PA-C  for consultation & management of Crohn's disease.  Patient has not seen me since May 2022.  She self discontinued Entyvio sometime late last year because she was she was doing so well.  For last 2 weeks, she has been experiencing urgency, loss of appetite, rectal bleeding and loose stools.  Patient is accompanied by her daughter today.   Crohn's disease classification:  Age: > 40 Location: ileocolonic  Behavior: non stricturing, non penetrating  Perianal: no  IBD diagnosis: 11/2013  Disease course:Crohn's disease in 2015, initial symptoms were abdominal pain, blood in stool and on wiping, bloating. She had a colonoscopy, VCE, EGD at the time of diagnosis. She was initially on mesalamine 4 pills daily, prednisone later switched to Remicade in 11/2014. She took only 5 doses as she could not afford. Then she was switched to Imuran 50 mg daily. She had mild flareups about twice a year. Colonoscopy 05/2017 revealed mild ileocolonic disease primarily on histology. She self discontinued Imuran. Lialda started in 06/2017.  Possible exacerbation of Crohn's disease with elevated fecal calprotectin in 10/2018.  CT abdomen and pelvis unremarkable.  Discontinued Lialda, started on azathioprine 50 mg daily.  Flareup of Crohn's in 10/2018, confirmed on colonoscopy and elevated fecal calprotectin levels.  Short prednisone course and started Entyvio on 05/23/2019 Discontinued azathioprine in 09/2019.  Normal fecal calprotectin levels, normal CRP in 09/2019.  Has been in clinical and  histologic remission.  Patient self discontinued Entyvio in 06/2021 because she felt she has been doing well and did not have to take it long-term.  Extra intestinal manifestations: None  IBD surgical history: None No family history of IBD  Imaging:  MRE none CTE none SBFT none  Procedures: Colonoscopy 07/16/2020 - The examined portion of the ileum was normal. - The entire examined colon is normal. Biopsied. - The distal rectum and anal verge are normal on retroflexion view. - Simple Endoscopic Score for Crohn's Disease: 0, mucosal inflammatory changes secondary to quiescent Crohn's disease. DIAGNOSIS:  A. COLON POLYP, CECUM; COLD SNARE:  - HYPERPLASTIC POLYP.  - NEGATIVE FOR DYSPLASIA AND MALIGNANCY.   B.  COLON, RIGHT; COLD BIOPSY:  - FOCAL MILD CHRONIC INACTIVE COLITIS CONSISTENT WITH PATIENT'S KNOWN  HISTORY OF CROHN'S DISEASE.  - NEGATIVE FOR DYSPLASIA AND MALIGNANCY.   C.  COLON, LEFT; COLD BIOPSY:  - MILD CHRONIC INACTIVE COLITIS CONSISTENT WITH PATIENT'S KNOWN HISTORY  OF CROHN'S DISEASE.  - NEGATIVE FOR DYSPLASIA AND MALIGNANCY.   D.  RECTUM; COLD BIOPSY:  - UNREMARKABLE COLONIC TYPE MUCOSA.  - NEGATIVE FOR DYSPLASIA AND MALIGNANCY.   Colonoscopy : 01/28/2005, showed external hemorrhoids only Colonoscopy 12/10/2013, showed normal terminal ileum, biopsies performed. Skipped areas of nonbleeding ulcerative mucosa were seen in the entire colon, biopsies performed. 4 mm polyp in the sigmoid colon Pathology: Rectal biopsy mild-to-moderate active proctitis with architectural features of chronicity, negative for dysplasia and malignancy. Terminal ileum: Small bowel mucosa with preserved villous architecture. Right colon biopsy: No significant pathologic changes  Upper Endoscopy 12/10/2013, underwent dilation of the  esophagus Ampullary biopsy: Normal, gastric biopsy: Intramucosal with erosion and mild chronic gastritis, negative for H. pylori, dysplasia and  malignancy  VCE 05/06/2014: Erosions in the distal ileum  EGD and colonoscopy 03/08/2019  DIAGNOSIS:  A.  DUODENUM POLYP; COLD BIOPSY:  - DUODENAL MUCOSA WITH PROMINENT BRUNNER'S GLAND AND REACTIVE CHANGES IN  OVERLYING VILLI.  - [place] ENTERITIS, DYSPLASIA, AND MALIGNANCY.   B.  STOMACH, RANDOM; COLD BIOPSY:  - ANTRAL MUCOSA WITH CHANGES CONSISTENT WITH ENDOSCOPIC FINDING OF  ANTRAL EROSION.  - FRAGMENTS OF UNREMARKABLE OXYNTIC MUCOSA.  - NEGATIVE FOR H. PYLORI, DYSPLASIA, AND MALIGNANCY.   C.  TERMINAL ILEUM; COLD BIOPSY:  - UNREMARKABLE SMALL INTESTINAL MUCOSA.  - NEGATIVE FOR ACTIVE ENTERITIS, DYSPLASIA, AND MALIGNANCY.   D.  COLON, RANDOM RIGHT; COLD BIOPSY:  - MODERATE CHRONIC ACTIVE COLITIS CONSISTENT WITH PATIENT'S KNOWN  HISTORY OF CROHN'S.  - NEGATIVE FOR VIRAL CYTOPATHIC EFFECT, DYSPLASIA, AND MALIGNANCY.   E.  COLON, RANDOM TRANSVERSE; COLD BIOPSY:  - MILD CHRONIC ACTIVE COLITIS INVOLVING A MINORITY OF THE BIOPSY  FRAGMENTS.  - CHRONIC INACTIVE COLITIS INVOLVING THE MAJORITY OF THE BIOPSY  FRAGMENTS.  - NEGATIVE FOR VIRAL CYTOPATHIC EFFECT, DYSPLASIA, AND MALIGNANCY.   F.  COLON, RANDOM LEFT; COLD BIOPSY:  - PATCHY MILD CHRONIC INACTIVE COLITIS.  - NEGATIVE FOR DYSPLASIA AND MALIGNANCY.   G.  RECTUM, RANDOM PROCTITIS; COLD BIOPSY:  - MARKED CHRONIC ACTIVE PROCTITIS CONSISTENT WITH PATIENT'S KNOWN  HISTORY OF CROHN'S.  - NEGATIVE FOR VIRAL CYTOPATHIC EFFECT, DYSPLASIA, AND MALIGNANCY.  EGD 06/08/2017 Normal esophagus, small hiatal hernia, normal stomach and duodenum  Colonoscopy 06/08/2017 The perianal and digital rectal examinations were normal. Pertinent negatives include normal sphincter tone and no palpable rectal Lesions. The terminal ileum appeared normal. Biopsies were taken with a cold forceps for histology. Two scattered non-bleeding aphthae were found in the ascending colon. Rest of the colon and rectum  appeared normal. Random biopsies performed   IBD medications:  Steroids: Prednisone steroid responsive, budesonide 5-ASA: mesalamine, Lialda in the past Immunomodulators: azathioprine, discontinued in 2020 TPMT status normal Biologics:  Anti TNFs: Remicade monotherapy, received induction followed by 1 maintenance dose in 11/2014 Anti Integrins: Entyvio started on 05/23/2019 Ustekinumab: Tofactinib: Clinical trial:   Past Medical History:  Diagnosis Date   Abdominal pain, epigastric    Allergic rhinitis    Arthritis    Asthma    Breast cancer ([place]) 2006   LT LUMPECTOMY   Clotting disorder ([place])    Cognitive deficits as late effect of cerebrovascular disease    COPD (chronic obstructive pulmonary disease) ([place])    COVID-19 virus infection 04/2021   Crohn's disease ([place]) 05/21/2015   FOLLOWED BY GI   Crohn's disease of both small and large intestine with rectal bleeding ([place]) 12/04/2014   Depression    Currently taking zoloft.   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 05/2021 Echo: EF 60-65%, no rwma, nl RV fxn.   History of kidney stones    Hypercholesterolemia 01/18/2007   Hyperlipidemia    Hypertension    Hypothyroidism    Low back pain radiating to left leg 06/17/2021   Nonobstructive CAD (coronary artery disease)    a. 10/2012 Cath: diffuse minor irregs-->med rx; b. 08/2021 Cor CTA:  Ca2+ = 55.6 (77th%'ile), LAD 25p, LCX <25p. No signif non-cardiac findings.   Occlusion, cerebral artery    NOS w/infarction   Osteoporosis    Overweight (BMI 25.0-29.9) 07/17/2022   Personal history of radiation therapy 2006   BREAST CA   Pulmonary embolism ([place])    a.04/2021 CTA Chest: Small filling defects are noted in upper and lower lobe branches of L PA-->acute PE-->eliquis. *PE occurred in context of  COVID infxn.   Stroke [facility])    Ulcer     Past Surgical History:  Procedure Laterality Date   BREAST BIOPSY Left 2006   POS   BREAST LUMPECTOMY Left 09/20/2004   positive   BREAST SURGERY Left    malignant biopsy   CARDIAC CATHETERIZATION     COLONOSCOPY  2015   COLONOSCOPY WITH PROPOFOL N/A 06/08/2017   Procedure: COLONOSCOPY WITH PROPOFOL;  Surgeon: Lin Landsman, MD;  Location: [place];  Service: Gastroenterology;  Laterality: N/A;   COLONOSCOPY WITH PROPOFOL N/A 03/08/2019   Procedure: COLONOSCOPY WITH PROPOFOL;  Surgeon: Lin Landsman, MD;  Location: [facility] ENDOSCOPY;  Service: Gastroenterology;  Laterality: N/A;   COLONOSCOPY WITH PROPOFOL N/A 07/16/2020   Procedure: COLONOSCOPY WITH PROPOFOL;  Surgeon: Lin Landsman, MD;  Location: [facility] ENDOSCOPY;  Service: Gastroenterology;  Laterality: N/A;   ESOPHAGOGASTRODUODENOSCOPY  2015   ESOPHAGOGASTRODUODENOSCOPY (EGD) WITH PROPOFOL N/A 06/08/2017   Procedure: ESOPHAGOGASTRODUODENOSCOPY (EGD) WITH PROPOFOL;  Surgeon: Lin Landsman, MD;  Location: [place];  Service: Gastroenterology;  Laterality: N/A;   ESOPHAGOGASTRODUODENOSCOPY (EGD) WITH PROPOFOL N/A 03/08/2019   Procedure: ESOPHAGOGASTRODUODENOSCOPY (EGD) WITH PROPOFOL;  Surgeon: Lin Landsman, MD;  Location: [facility] ENDOSCOPY;  Service: Gastroenterology;  Laterality: N/A;   FINGER SURGERY     Right small finger   FRACTURE SURGERY     GIVENS CAPSULE STUDY  2015   TUBAL LIGATION       Current Outpatient Medications:    apixaban (ELIQUIS) 5 MG TABS tablet, Take 1 tablet (5 mg total) by mouth 2 (two) times daily., Disp: 60 tablet, Rfl: 1   brimonidine (ALPHAGAN) 0.2 % ophthalmic solution, Place 1 drop into both eyes in the morning and at bedtime., Disp: , Rfl:    budesonide (ENTOCORT EC) 3 MG 24 hr capsule, Take 3 capsules (9 mg total) by mouth daily., Disp: 90 capsule, Rfl: 0   hydrochlorothiazide (MICROZIDE) 12.5 MG capsule, Take 1  capsule (12.5 mg total) by mouth daily., Disp: 30 capsule, Rfl: 1   latanoprost (XALATAN) 0.005 % ophthalmic solution, Place 1 drop into both eyes at bedtime. , Disp: , Rfl:    levothyroxine (SYNTHROID) 75 MCG tablet, Take 1 tablet (75 mcg total) by mouth daily before breakfast., Disp: 90 tablet, Rfl: 0   losartan (COZAAR) 50 MG tablet, Take 1 tablet (50 mg total) by mouth daily., Disp: 30 tablet, Rfl: 1   Multiple Vitamin (MULTIVITAMIN) tablet, Take 1 tablet by mouth daily., Disp: , Rfl:    rosuvastatin (CRESTOR) 40 MG tablet, Take 1 tablet (40 mg total) by mouth daily., Disp: 90 tablet, Rfl: 0   sertraline (ZOLOFT) 50 MG tablet, Take 1 tablet (50 mg total) by mouth daily., Disp: 30 tablet, Rfl: 2   Family History  Problem Relation Age of Onset   Heart disease Mother    Heart attack Mother    Cerebrovascular Accident Father    Arthritis Father    Hypertension Father    Dementia Father    Breast cancer Sister    Breast  cancer Sister 76   Cancer Sister    Hypertension Other    Diabetes Other    Heart attack Other 33   Mental illness Neg Hx      Social History   Tobacco Use   Smoking status: Never   Smokeless tobacco: Never  Vaping Use   Vaping Use: Never used  Substance Use Topics   Alcohol use: No    Alcohol/week: 0.0 standard drinks of alcohol   Drug use: No    Allergies as of 09/08/2022   (No Known Allergies)    Review of Systems:    All systems reviewed and negative except where noted in HPI.   Physical Exam:  BP 135/72 (BP Location: Left Arm, Patient Position: Sitting, Cuff Size: Normal)   Pulse 72   Temp 99 F (37.2 C) (Oral)   Ht 5' (1.524 m)   Wt 141 lb 2 oz (64 kg)   BMI 27.56 kg/m  No LMP recorded. Patient is postmenopausal.  General:   Alert,  Well-developed, well-nourished, pleasant and cooperative in NAD Head:  Normocephalic and atraumatic. Eyes:  Sclera clear, no icterus.   Conjunctiva pink. Ears:  Normal auditory acuity. Nose:  No deformity,  discharge, or lesions. Mouth:  No deformity or lesions,oropharynx pink & moist. Neck:  Supple; no masses or thyromegaly. Lungs:  Respirations even and unlabored.  Clear throughout to auscultation.   No wheezes, crackles, or rhonchi. No acute distress. Heart:  Regular rate and rhythm; no murmurs, clicks, rubs, or gallops. Abdomen:  Normal bowel sounds.  No bruits.  Soft, nontender, and non-distended without masses, hepatosplenomegaly or hernias noted.  No guarding or rebound tenderness.   Rectal: Nor performed Msk:  Symmetrical without gross deformities. Good, equal movement & strength bilaterally. Pulses:  Normal pulses noted. Extremities:  No clubbing or edema.  No cyanosis. Neurologic:  Alert and oriented x3;  grossly normal neurologically. Skin:  Intact without significant lesions or rashes. No jaundice. Psych:  Alert and cooperative. Normal mood and affect.  Imaging Studies: MRI reviewed  Assessment and Plan:   Carolyn Campbell is a 66 y.o. African-American female with Terminal ileal and colonic Crohn's, diagnosed in 2015 who was previously maintained on Imuran 50 mg daily with mild intermittent flareups about twice a year needing prednisone. She does not have any endoscopy evidence of ileocolonic Crohn's disease other than a few scattered aphthae in the ascending colon on colonoscopy in 05/2017 but histology revealed chronic mild active ileitis and right-sided colitis. MR enterography did not inflammation in small bowel. I tried to give her budesonide but she could not afford high co-pay of $500 for 30 days supply.  She stopped Imuran by herself in the past.  Recent flareup of her Crohn's disease with worsening of upper abdominal pain associated with nausea, unintentional weight loss, poor p.o. intake. Fecal calprotectin levels are elevated.  Discontinued Lialda, currently on azathioprine 50 mg daily.  She took prednisone for 2 days only due to new onset of rash and itching.  Rash has resolved.   EGD was unremarkable.  Repeat colonoscopy revealed active Crohn's colitis, worse in the rectum with mild symptoms.  Started on Entyvio on 05/23/2019, currently in clinical and histologic remission.  Crohn's disease of large intestine, nonstricturing, nonpenetrating Normal fecal calprotectin levels, normal CRP, in clinical and histologic remission until 2 weeks ago now presents with exacerbation -Patient is self discontinued Entyvio about 1 year ago -CBC, CRP, fecal calprotectin levels Recommend colonoscopy Advised patient that Weyman Rodney is her  long-term medication to keep Crohn's disease under control as long as she is tolerating it and she has to inform me if she decides to discontinue.  Will apply for Entyvio as afraid should start  Chronic GERD, well controlled EGD in 2020 was normal Currently not on PPI and asymptomatic  IBD Health Maintenance  1.TB status: Gold quantiferon negative 09/2019 2. Anemia: normal Hb, vitamin B12 and ferritin levels are normal 3.Immunizations: Hep A immune, hepatitis B immune, and annual influenza vaccine, prevnar received in 06/2017, pneumovax administered on 03/27/2019.  Received first dose of Shingrix vaccine on 04/11/2019, and booster on 01/29/2021 4.Cancer screening I) Colon cancer/dysplasia surveillance: Colonoscopy 07/16/2020, no evidence of dysplasia and no precancerous polyps identified, recommend colonoscopy as above II) Cervical cancer: n/a III) Skin cancer - n/a  5.Bone health Vitamin D status: Normal Bone density testing: On 09/25/2020, revealed osteopenia Normal vitamin D levels in the past 5. Labs: Every with every other infusion 6. Smoking: Never smoked 7. NSAIDs and Antibiotics use: Denies NSAID use and antibiotic use  Follow up in 3 to 4 months  [name], MD

## 2022-09-09 NOTE — Telephone Encounter (Signed)
Sent mychart message

## 2022-09-09 NOTE — Telephone Encounter (Signed)
Patient called back and verbalized understanding of results

## 2022-09-09 NOTE — Telephone Encounter (Signed)
Called and left a message for call back  

## 2022-09-09 NOTE — Telephone Encounter (Signed)
-----   Message from Lin Landsman, MD sent at 09/09/2022 11:27 AM EST ----- Please inform patient that inflammatory marker is elevated and I would like to know the stool study results before she undergoes colonoscopy  RV

## 2022-09-10 ENCOUNTER — Other Ambulatory Visit
Admission: RE | Admit: 2022-09-10 | Discharge: 2022-09-10 | Disposition: A | Discharge status: Home / Self Care | Payer: Medicare Other | Source: Ambulatory Visit | Attending: Family Medicine | Admitting: Family Medicine

## 2022-09-10 DIAGNOSIS — R195 Other fecal abnormalities: Secondary | ICD-10-CM | POA: Insufficient documentation

## 2022-09-10 DIAGNOSIS — K50118 Crohn's disease of large intestine with other complication: Secondary | ICD-10-CM | POA: Insufficient documentation

## 2022-09-10 DIAGNOSIS — R197 Diarrhea, unspecified: Secondary | ICD-10-CM | POA: Diagnosis not present

## 2022-09-10 DIAGNOSIS — K5010 Crohn's disease of large intestine without complications: Secondary | ICD-10-CM

## 2022-09-14 ENCOUNTER — Encounter: Payer: Self-pay | Admitting: Gastroenterology

## 2022-09-15 ENCOUNTER — Other Ambulatory Visit: Payer: Self-pay

## 2022-09-15 MED ORDER — PROMETHAZINE HCL 12.5 MG PO TABS: 12.5000 mg | ORAL_TABLET | Freq: Four times a day (QID) | ORAL | 0 refills | Status: DC | PRN

## 2022-09-16 ENCOUNTER — Telehealth: Payer: Self-pay | Admitting: Internal Medicine

## 2022-09-16 LAB — CALPROTECTIN, FECAL: Calprotectin, Fecal: 1140 ug/g — ABNORMAL HIGH (ref 0–120)

## 2022-09-16 MED ORDER — ENOXAPARIN SODIUM 60 MG/0.6ML IJ SOSY: 60.0000 mg | PREFILLED_SYRINGE | Freq: Two times a day (BID) | INTRAMUSCULAR | 0 refills | Status: DC

## 2022-09-16 NOTE — Telephone Encounter (Signed)
[facility] reached out for management of anticoagulation.  Patient had unprovoked PE on 07/19/2022 and has been on Eliquis.  Patient is scheduled for colonoscopy 09/30/2022.  She has history of Crohn's.  The case was discussed with Dr. Marius Ditch and would prefer colonoscopy to be done on the scheduled date.  Considering that the occurrence of PE will be less than 3 months, will consider Lovenox bridging.  Patient will be instructed to stop Eliquis on 09/28/2022  Inject Lovenox 60 mg in a.m. and p.m. on 09/28/2022.  Inject Lovenox 60 mg in a.m. on 09/29/2022 then stop.  Patient can resume Eliquis 24 hours after the procedure unless there is unexpected bleeding as documented by the proceduralist.  Cc.Tawni Carnes -please inform the patient on the instructions.

## 2022-09-17 ENCOUNTER — Telehealth: Payer: Self-pay

## 2022-09-17 ENCOUNTER — Other Ambulatory Visit: Payer: Self-pay

## 2022-09-17 ENCOUNTER — Encounter: Payer: Self-pay | Admitting: Gastroenterology

## 2022-09-17 DIAGNOSIS — R197 Diarrhea, unspecified: Secondary | ICD-10-CM

## 2022-09-17 DIAGNOSIS — K5010 Crohn's disease of large intestine without complications: Secondary | ICD-10-CM

## 2022-09-17 NOTE — Telephone Encounter (Signed)
-----   Message from Lin Landsman, MD sent at 09/17/2022  7:41 AM EST ----- Significantly elevated fecal calprotectin levels which means she has active chron's disease. Recommend to start prednisone 98m daily for 2 weeks, followed by taper, decrease by 180mweekly until finished. She should drop off stool specimen first to rule infection before starting prednisone  RV

## 2022-09-17 NOTE — Telephone Encounter (Signed)
Pt notified of lab results. Pt will go today to pick up stool containers. Pt aware if these are normal, she will start Prednisone per Dr. Verlin Grills recommendation.   ---- Message from Lin Landsman, MD sent at 09/17/2022  7:41 AM EST ----- Significantly elevated fecal calprotectin levels which means she has active chron's disease. Recommend to start prednisone 44m daily for 2 weeks, followed by taper, decrease by 146mweekly until finished. She should drop off stool specimen first to rule infection before starting prednisone   RV

## 2022-09-18 LAB — GASTROINTESTINAL PANEL BY PCR, STOOL (REPLACES STOOL CULTURE)

## 2022-09-19 ENCOUNTER — Other Ambulatory Visit: Payer: Self-pay | Admitting: Gastroenterology

## 2022-09-19 DIAGNOSIS — K50911 Crohn's disease, unspecified, with rectal bleeding: Secondary | ICD-10-CM

## 2022-09-19 MED ORDER — PREDNISONE 10 MG PO TABS: ORAL_TABLET | ORAL | 0 refills | Status: AC

## 2022-09-21 ENCOUNTER — Encounter: Payer: Self-pay | Admitting: Family Medicine

## 2022-09-21 ENCOUNTER — Ambulatory Visit (INDEPENDENT_AMBULATORY_CARE_PROVIDER_SITE_OTHER): Payer: Medicare Other | Admitting: Family Medicine

## 2022-09-21 VITALS — BP 124/68 | HR 95 | Temp 98.0°F | Resp 16 | Ht 60.0 in | Wt 140.4 lb

## 2022-09-21 DIAGNOSIS — K50811 Crohn's disease of both small and large intestine with rectal bleeding: Secondary | ICD-10-CM

## 2022-09-21 DIAGNOSIS — E039 Hypothyroidism, unspecified: Secondary | ICD-10-CM | POA: Diagnosis not present

## 2022-09-21 DIAGNOSIS — Z7901 Long term (current) use of anticoagulants: Secondary | ICD-10-CM | POA: Diagnosis not present

## 2022-09-21 DIAGNOSIS — I2699 Other pulmonary embolism without acute cor pulmonale: Secondary | ICD-10-CM | POA: Diagnosis not present

## 2022-09-21 DIAGNOSIS — I69359 Hemiplegia and hemiparesis following cerebral infarction affecting unspecified side: Secondary | ICD-10-CM | POA: Diagnosis not present

## 2022-09-21 DIAGNOSIS — R569 Unspecified convulsions: Secondary | ICD-10-CM

## 2022-09-21 DIAGNOSIS — I25118 Atherosclerotic heart disease of native coronary artery with other forms of angina pectoris: Secondary | ICD-10-CM

## 2022-09-21 DIAGNOSIS — I69919 Unspecified symptoms and signs involving cognitive functions following unspecified cerebrovascular disease: Secondary | ICD-10-CM | POA: Diagnosis not present

## 2022-09-21 DIAGNOSIS — F325 Major depressive disorder, single episode, in full remission: Secondary | ICD-10-CM

## 2022-09-21 DIAGNOSIS — E782 Mixed hyperlipidemia: Secondary | ICD-10-CM

## 2022-09-21 DIAGNOSIS — Z79899 Other long term (current) drug therapy: Secondary | ICD-10-CM | POA: Diagnosis not present

## 2022-09-21 DIAGNOSIS — I10 Essential (primary) hypertension: Secondary | ICD-10-CM | POA: Diagnosis not present

## 2022-09-21 DIAGNOSIS — F330 Major depressive disorder, recurrent, mild: Secondary | ICD-10-CM

## 2022-09-21 MED ORDER — LOSARTAN POTASSIUM-HCTZ 50-12.5 MG PO TABS: 1.0000 | ORAL_TABLET | Freq: Every day | ORAL | 2 refills | Status: DC

## 2022-09-21 NOTE — Progress Notes (Signed)
Name: Carolyn Campbell   MRN: [id number]    DOB: [birth year]   Date:09/21/2022       Progress Note  Chief Complaint  Patient presents with   Follow-up   Hypothyroidism   Hypertension     Subjective:   Carolyn Campbell is a 67 y.o. female, presents to clinic for f/up on various sx and thyroid  Pt previously presented Mid November for [place], here for recheck of BP and thyroid Having crohns flare working with GI on that, blood in stool or vomit, started steroids yesterday  Expand All Collapse All     Name: Carolyn Campbell   MRN: [id number]    DOB: [birth year]   Date:08/03/2022                                                               Progress Note       Chief Complaint  Patient presents with   Hospitalization Follow-up      LT:JQZES encephalopathy, Pt states feeling much better but still having episodes of dizziness/lightheaded while walking too much. Pt concerned of having moments at the hospital where she couldn't move her body but she could hear and see.        Subjective:    Carolyn Campbell is a 67 y.o. female, presents to clinic for hospital f/up and requesting referrals   Here for hospital follow up/transition of care. Admitted 3x in the past couple weeks   07/16/22-07/17/22 PE  10/30-11/2 transfer to [place] 11/2-11/4    Admit date: 07/19/2022 Discharge date: 07/24/2022 Transition of care was initiated previously by lanette Hamilton on 07/26/2022 and med changes, diagnosis, specialist follow ups and pts symptoms and condition were all reviewed.   Pt was admitted for AMS/acute encephalopathy New medications started per hospitalization include - none Labs due today are- none Pt feels a little better but still having episodes of freezing, can't walk very far gets tired Admit date: 07/22/2022 Discharge date: 07/24/2022   Admitted From: Home Disposition: Home   Recommendations for Outpatient Follow-up:  Follow up with PCP in 1-2 weeks Follow-up with psychiatry as  scheduled in the next 2 weeks   Home Health: PT   Discharge Condition: Stable CODE STATUS: Full Diet recommendation: Regular diet as tolerated   Brief/Interim Summary: Carolyn Campbell is a 67 y.o. female with past medical history significant for history of CVA, major depressive disorder, REM sleep behavior disorder, prediabetes, multifactorial cognitive impairment, Crohn's disease, hypothyroidism, HTN, recent diagnosis of pulmonary embolism on Eliquis who presented to [facility] on 10/30 via EMS with complaints of shortness of breath.  While in route to the ED, EMS reports that patient became "catatonic" and confused with slow response.     Neurology/Psych consulted - recurrent episodes 11/1 and 11/2 - transferred to [facility] main campus for long term EEG to hopefully record one of these episodes to rule in/out seizure.  Patient had notable event while on continuous EEG without any notable epilepsy/epileptiform activity there is ongoing concern this is a behavioral/conversion disorder issue.  Recommend close follow-up with psychiatry in the outpatient setting.  Patient otherwise stable and agreeable for discharge home, lengthy conversation with family at bedside about patient's need for improved activity and sleep hygiene as this  may be contributing to patient's symptoms.   Discharge Diagnoses:  Principal Problem:   Acute encephalopathy Active Problems:   Pulmonary embolism (HCC)   Essential hypertension   HLD (hyperlipidemia)   Acquired hypothyroidism     Pt presents with with episodes of dizziness/getting lightheaded after walking a lot Also episodes of feeling paralyzed    She had requested PT referral  She should have psychiatry f/up, neuro f/up   Neuro consult reviewed: Assessment: 67 year old female who presents with decreased interactivity.  She has an inconsistent exam and has had normal imaging and EEG.  She was transferred to [facility] from [facility] for LTM. There is  suspicion for conversion disorder vs seizures. Her symptoms have waxed and waned during her hospitalization. This morning she is awake and alert upon my entrance into her room. Of note, she did not have any improvement with ativan upon her arrival to [facility] ED.  - Exam reveals halting, hypophonic speech with slight stuttering. Overall quality is suggestive of conversion disorder versus secondary gain or stress-related symptoms. No jerking, twitching or automatisms seen.  - CT-head at [facility]: No hemorrhage or CT evidence of an acute infarct. - CTA head and neck at [facility]: No emergent large vessel occlusion or proximal hemodynamically significant stenosis. - MRI brain at [facility]: No acute intracranial abnormality. Mild chronic microvascular ischemic change. - Spot EEG at [facility]: This essentially normal EEG is recorded in the waking and sleep state.  The excessive beta activity seen is likely secondary to medication effect.  There was no seizure or seizure predisposition recorded on this study.  - LTM EEG at [facility]: This study is within normal limits. No seizures or epileptiform discharges were seen throughout the recording.   - Patient did have numerous questions about her blood clots. I explained that this is not something she would have to stay in the hospital to work up and that I recommend she follows up with her PCP if she would like hypercoagulable or hematology testing to determine the cause of her PE.    Impression: Conversion disorder vs stress related seizure-like events vs medication reaction to rivastigmine      Recommendations: - Follow up with outpatient neurologist  - Follow up with PCP to request lab work or hematology follow up to determine cause of blood clots.  - Outpatient seizure precautions: Per [facility] statutes, patients with seizures are not allowed to drive until  they have been seizure-free for six months. Use caution when using heavy equipment or power tools. Avoid working on  ladders or at heights. Take showers instead of baths. Ensure the water temperature is not too high on the home water heater. Do not go swimming alone. When caring for infants or small children, sit down when holding, feeding, or changing them to minimize risk of injury to the child in the event you have a seizure. Also, Maintain good sleep hygiene. Avoid alcohol. Patient seen and examined by NP/APP with MD. MD to update note as needed.  - Neurohospitalist service will sign off. Please call if there are additional questions.    Carolyn Ores, Carolyn Campbell, Carolyn Campbell [facility] Pager: [phone]   Electronically signed: Dr. Kerney Elbe     Questions about PE - PE last year August 2022 after having COVID   PE dx this year 10/26   Insomnia/parasomnias//excessive daytime sleepiness -last year she was referred to neurology, multiple med changes recently I have reviewed the outpatient office visits through care everywhere extensively and medications and  med changes Pt was on clonazepam 0.5 mg - supposed to take 1/2 dose per Dr. Manuella Ghazi, but she wasn't The pt and her husband did cut it back to 1/2 pill about early oct She just did F/up with neurology 10/26 -prior to going to the hospital She has a sleep study coming up in December        Transition of Care [facility]) - CM/SW Discharge Note     Patient Details  Name: Carolyn Campbell MRN: [id number] Date of Birth: September 16, 1956   Transition of Care [facility]) CM/SW Contact:  Carles Collet, RN Phone Number: 07/24/2022, 3:45 PM     Clinical Narrative:      Ordered HH PT.  [facility], [facility], [facility] unable to take. Accepted by [facility]. No other TOC needs identified for DC   Final next level of care: Home w Home Health Services Barriers to Discharge: No Barriers Identified     Patient Goals and CMS Choice   Discharge Placement              Discharge Plan and Services Discharge Planning Services: CM Consult            HH Arranged: PT HH  Agency: [facility] Date [place]: 07/24/22 Time [place]: [place] Representative spoke with at [place]: Kristi    Hypertension:  Currently managed on losartan - was previously on losartan HCTZ - changed last day of admission  Pt reports good med compliance and denies any SE.   Blood pressure today is elevated controlled.    BP Readings from Last 3 Encounters:  08/03/22 (!) 144/72  07/24/22 136/65  07/22/22 (!) 185/95    Pt denies CP, SOB, exertional sx, LE edema, palpitation, Ha's, visual disturbances, , hypotension, syncope. Blood pressures inpatient and labs were reviewed thoroughly as well as hospitalist progress notes and discharge summary I cannot find any reasons for the blood pressure med changes     Hypothyroidism: Current Medication Regimen: 50 mcg synthroid Takes medicine daily in the morning on empty stomach she endorses good compliance Current Symptoms: Extreme fatigue and cognitive dysfunction, falling asleep easily, slowed thoughts, no weight changes or cold intolerance Most recent results are below; we will be repeating labs 6 to 8 weeks after med change. Recent Labs       Lab Results  Component Value Date    TSH 6.178 (H) 07/23/2022      Recent Labs       Lab Results  Component Value Date    TSH 6.178 (H) 07/23/2022    TSH 1.05 08/04/2021    TSH 0.73 01/29/2021    TSH 1.10 03/04/2020    TSH 1.99 06/25/2019    TSH 2.34 07/19/2018       TODAY:  Plan was for pt to consult with hematology about PE x 2 F/up neurology and psychiatry outpt Do PT for gait/balance/vertigo  Med f/up here advised in 2 weeks for BP recheck - did f/up with Erin on 12/4 Hypertension:  Currently managed on losartan 50 and HCTZ was added back in  Pt reports good med compliance and denies any SE.   Blood pressure today is well controlled. BP Readings from Last 3 Encounters:  09/21/22 124/68  09/08/22 135/72  08/23/22 (!) 152/70   Pt denies CP, SOB,  exertional sx, LE edema, palpitation, Ha's, visual disturbances, lightheadedness, hypotension, syncope.   Thyroid  On 75 mcg daily, dose changed last Nov after increased TSH with multiple sx (previously on  50 mcg for years) Since changing med dose generalized fatigue unchanged, no weight changes, she denies palpitations, mood changes  Lab Results  Component Value Date   TSH 6.178 (H) 07/23/2022   TSH 1.05 08/04/2021   TSH 0.73 01/29/2021   TSH 1.10 03/04/2020   TSH 1.99 06/25/2019       Current Outpatient Medications:    apixaban (ELIQUIS) 5 MG TABS tablet, Take 1 tablet (5 mg total) by mouth 2 (two) times daily., Disp: 60 tablet, Rfl: 1   brimonidine (ALPHAGAN) 0.2 % ophthalmic solution, Place 1 drop into both eyes in the morning and at bedtime., Disp: , Rfl:    hydrochlorothiazide (MICROZIDE) 12.5 MG capsule, Take 1 capsule (12.5 mg total) by mouth daily., Disp: 30 capsule, Rfl: 1   latanoprost (XALATAN) 0.005 % ophthalmic solution, Place 1 drop into both eyes at bedtime. , Disp: , Rfl:    levothyroxine (SYNTHROID) 75 MCG tablet, Take 1 tablet (75 mcg total) by mouth daily before breakfast., Disp: 90 tablet, Rfl: 0   losartan (COZAAR) 50 MG tablet, Take 1 tablet (50 mg total) by mouth daily., Disp: 30 tablet, Rfl: 1   Multiple Vitamin (MULTIVITAMIN) tablet, Take 1 tablet by mouth daily., Disp: , Rfl:    predniSONE (DELTASONE) 10 MG tablet, Take 4 tablets (40 mg total) by mouth daily with breakfast for 14 days, THEN 3 tablets (30 mg total) daily with breakfast for 7 days, THEN 2 tablets (20 mg total) daily with breakfast for 7 days, THEN 1 tablet (10 mg total) daily with breakfast for 7 days., Disp: 100 tablet, Rfl: 0   promethazine (PHENERGAN) 12.5 MG tablet, Take 1 tablet (12.5 mg total) by mouth every 6 (six) hours as needed for nausea or vomiting., Disp: 30 tablet, Rfl: 0   rosuvastatin (CRESTOR) 40 MG tablet, Take 1 tablet (40 mg total) by mouth daily., Disp: 90 tablet, Rfl: 0    enoxaparin (LOVENOX) 60 MG/0.6ML injection, Inject 0.6 mLs (60 mg total) into the skin every 12 (twelve) hours for 3 doses. Inject Lovenox 60 mg in a.m. and p.m. on 09/28/2022. Inject Lovenox 60 mg in a.m. on 09/29/2022 then stop., Disp: 3 mL, Rfl: 0   sertraline (ZOLOFT) 50 MG tablet, Take 1 tablet (50 mg total) by mouth daily., Disp: 30 tablet, Rfl: 2  Patient Active Problem List   Diagnosis Date Noted   MDD (major depressive disorder), recurrent episode, mild ([place]) 08/18/2022   Restless leg syndrome 37/85/8850   Acute metabolic encephalopathy 27/74/1287   Acute encephalopathy 07/22/2022   Pulmonary embolism ([place]) 07/16/2022   Pain in limb 11/10/2021   Nonobstructive CAD (coronary artery disease) 08/06/2021   Other long term (current) drug therapy 01/22/2021   Varicose veins of both lower extremities with pain 07/19/2018   Arthritis of right hand 12/21/2017   Major depression in remission ([place]) 12/21/2017   Fibrocystic breast changes 06/22/2017   Osteopenia 10/18/2016   Atherosclerosis of aorta ([place]) 12/19/2015   Vitreous degeneration 05/21/2015   Glaucoma associated with chamber angle anomalies 05/21/2015   H/O malignant neoplasm of breast 05/21/2015   Crohn's disease of both small and large intestine with rectal bleeding ([place]) 12/04/2014   Solitary pulmonary nodule 11/07/2012   Cognitive deficits as late effect of cerebrovascular disease 01/20/2010   CVA, old, hemiparesis ([place]) 04/28/2009   Acquired hypothyroidism 06/12/2008   HLD (hyperlipidemia) 01/18/2007   Essential hypertension 01/18/2007    Past Surgical History:  Procedure Laterality Date   BREAST BIOPSY Left 2006  POS   BREAST LUMPECTOMY Left 09/20/2004   positive   BREAST SURGERY Left    malignant biopsy   CARDIAC CATHETERIZATION     COLONOSCOPY  2015   COLONOSCOPY WITH PROPOFOL N/A 06/08/2017   Procedure: COLONOSCOPY WITH PROPOFOL;  Surgeon: Lin Landsman, MD;  Location: [facility];  Service:  Gastroenterology;  Laterality: N/A;   COLONOSCOPY WITH PROPOFOL N/A 03/08/2019   Procedure: COLONOSCOPY WITH PROPOFOL;  Surgeon: Lin Landsman, MD;  Location: [facility] ENDOSCOPY;  Service: Gastroenterology;  Laterality: N/A;   COLONOSCOPY WITH PROPOFOL N/A 07/16/2020   Procedure: COLONOSCOPY WITH PROPOFOL;  Surgeon: Lin Landsman, MD;  Location: [facility] ENDOSCOPY;  Service: Gastroenterology;  Laterality: N/A;   ESOPHAGOGASTRODUODENOSCOPY  2015   ESOPHAGOGASTRODUODENOSCOPY (EGD) WITH PROPOFOL N/A 06/08/2017   Procedure: ESOPHAGOGASTRODUODENOSCOPY (EGD) WITH PROPOFOL;  Surgeon: Lin Landsman, MD;  Location: [place];  Service: Gastroenterology;  Laterality: N/A;   ESOPHAGOGASTRODUODENOSCOPY (EGD) WITH PROPOFOL N/A 03/08/2019   Procedure: ESOPHAGOGASTRODUODENOSCOPY (EGD) WITH PROPOFOL;  Surgeon: Lin Landsman, MD;  Location: [facility] ENDOSCOPY;  Service: Gastroenterology;  Laterality: N/A;   FINGER SURGERY     Right small finger   FRACTURE SURGERY     GIVENS CAPSULE STUDY  2015   TUBAL LIGATION      Family History  Problem Relation Age of Onset   Heart disease Mother    Heart attack Mother    Cerebrovascular Accident Father    Arthritis Father    Hypertension Father    Dementia Father    Breast cancer Sister    Breast cancer Sister 16   Cancer Sister    Hypertension Other    Diabetes Other    Heart attack Other 62   Mental illness Neg Hx     Social History   Tobacco Use   Smoking status: Never   Smokeless tobacco: Never  Vaping Use   Vaping Use: Never used  Substance Use Topics   Alcohol use: No    Alcohol/week: 0.0 standard drinks of alcohol   Drug use: No     No Known Allergies  Health Maintenance  Topic Date Due   Medicare Annual Wellness (AWV)  11/05/2020   COVID-19 Vaccine (8 - 2023-24 season) 10/07/2022 (Originally 09/02/2022)   DEXA SCAN  09/25/2022   MAMMOGRAM  06/25/2023   COLONOSCOPY (Pts 45-56yr Insurance coverage will need to be  confirmed)  07/16/2025   Pneumonia Vaccine 67 Years old  Completed   INFLUENZA VACCINE  Completed   Hepatitis C Screening  Completed   Zoster Vaccines- Shingrix  Completed   HPV VACCINES  Aged Out   DTaP/Tdap/Td  Discontinued    Chart Review Today: I personally reviewed active problem list, medication list, allergies, family history, social history, health maintenance, notes from last encounter, lab results, imaging with the patient/caregiver today.   Review of Systems  Constitutional: Negative.   HENT: Negative.    Eyes: Negative.   Respiratory: Negative.    Cardiovascular: Negative.   Gastrointestinal: Negative.   Endocrine: Negative.   Genitourinary: Negative.   Musculoskeletal: Negative.   Skin: Negative.   Allergic/Immunologic: Negative.   Neurological: Negative.   Hematological: Negative.   Psychiatric/Behavioral: Negative.    All other systems reviewed and are negative.    Objective:   Vitals:   09/21/22 1033  BP: 124/68  Pulse: 95  Resp: 16  Temp: 98 F (36.7 C)  TempSrc: Oral  SpO2: 99%  Weight: 140 lb 6.4 oz (63.7 kg)  Height: 5' (  1.524 m)    Body mass index is 27.42 kg/m.  Physical Exam Vitals and nursing note reviewed.  Constitutional:      General: She is not in acute distress.    Appearance: Normal appearance. She is well-developed. She is not ill-appearing, toxic-appearing or diaphoretic.     Comments: Pt well appearing, but looks tired  HENT:     Head: Normocephalic and atraumatic.     Right Ear: External ear normal.     Left Ear: External ear normal.     Nose: Nose normal.  Eyes:     General:        Right eye: No discharge.        Left eye: No discharge.     Conjunctiva/sclera: Conjunctivae normal.  Neck:     Trachea: No tracheal deviation.  Cardiovascular:     Rate and Rhythm: Normal rate and regular rhythm.     Pulses: Normal pulses.     Heart sounds: Normal heart sounds.  Pulmonary:     Effort: Pulmonary effort is normal. No  respiratory distress.     Breath sounds: Normal breath sounds. No stridor. No wheezing, rhonchi or rales.  Musculoskeletal:        General: Normal range of motion.  Skin:    General: Skin is warm and dry.     Findings: No rash.  Neurological:     Mental Status: She is alert.     Motor: No abnormal muscle tone.     Coordination: Coordination normal.  Psychiatric:        Mood and Affect: Mood normal.        Behavior: Behavior normal.         Assessment & Plan:   Problem List Items Addressed This Visit       Cardiovascular and Mediastinum   Primary hypertension    BP improved today and at goal BP Readings from Last 3 Encounters:  09/21/22 124/68  09/08/22 135/72  08/23/22 (!) 152/70  Will send in HTN meds on combo pill - continue losartan 50 HCTZ 12.5       Relevant Medications   losartan-hydrochlorothiazide (HYZAAR) 50-12.5 MG tablet   Other Relevant Orders   COMPLETE METABOLIC PANEL WITH GFR (Completed)   Nonobstructive CAD (coronary artery disease)    Pt having much more exertional sx since PE, when feeling a little better from current illness we have discussed doing cardiac or pulm rehab - she will let us know when she's ready to work on that      Relevant Medications   losartan-hydrochlorothiazide (HYZAAR) 50-12.5 MG tablet   Pulmonary embolism ([place])    Will be on long term anticoagulation with 2x PE Consults and management with hemonc reviewed with pt      Relevant Medications   losartan-hydrochlorothiazide (HYZAAR) 50-12.5 MG tablet     Digestive   Crohn's disease of both small and large intestine with rectal bleeding ([place])    Current flare - working with GI, just started steroids Following, recent specialists consult/notes reviewed, pt sx        Endocrine   Acquired hypothyroidism - Primary    here for recheck of TSH after dose adjustment, no noticible sx - still fatigued, no cognitive or weight changes, pt ill with other issues  For years on 50  mcg levothyroxine, with recently elevated labs dose was increased to 75 mcg daily Lab Results  Component Value Date   TSH 6.178 (H) 07/23/2022   TSH 1.05 08/04/2021  TSH 0.73 01/29/2021   TSH 1.10 03/04/2020   TSH 1.99 06/25/2019        Relevant Orders   TSH (Completed)   TSH     Nervous and Auditory   CVA, old, hemiparesis ([place])    Working with neurology, recent [facility] management after hospitalization, still having some balance/vertigo/weakness issues When d/c from [facility] will need to put in outpt PT orders        Other   HLD (hyperlipidemia)    Lab Results  Component Value Date   CHOL 193 01/29/2021   HDL 89 01/29/2021   [place] 85 01/29/2021   TRIG 91 01/29/2021   CHOLHDL 2.2 01/29/2021  Hx of stroke, on statin, overdue for recheck        Relevant Medications   losartan-hydrochlorothiazide (HYZAAR) 50-12.5 MG tablet   Other Relevant Orders   COMPLETE METABOLIC PANEL WITH GFR (Completed)   Cognitive deficits as late effect of cerebrovascular disease    Recently exacerbated with multiple meds/polypharmacy Working with psychiatry and neurology Avoid polypharmacy and sedating meds      Major depression in remission ([place])   Other long term (current) drug therapy   MDD (major depressive disorder), recurrent episode, mild ([place])    Recently established with psychiatry and is on zoloft She still needs to f/up with them and establish report and get established with therapist as well Pt did not understand the process with initial assessment, I have encouraged her to keep on medications for now (zoloft) and do f/up with psychiatry - Dr. Modesta Messing and trust the process - it will take time to get established, do assessment, get est with therapist, allow meds to work, etc Anxiety/stress/moods can exacerbate her physical sx and/or cause somatic sx - I explained the process which did start back with her hospitalization - and should continue outpt  Thyroid optimized as well - which I  explained can also be related to moods/cognition She is currently extremely tired with crohns - hard to assess moods- GAD and phq are negative today    09/21/2022   10:33 AM 08/23/2022   10:29 AM 08/18/2022    4:59 PM  Depression screen PHQ 2/9  Decreased Interest 0 0   Down, Depressed, Hopeless 0 0   PHQ - 2 Score 0 0   Altered sleeping 0 0   Tired, decreased energy 0 0   Change in appetite 0 0   Feeling bad or failure about yourself  0 0   Trouble concentrating 0 0   Moving slowly or fidgety/restless 0 0   Suicidal thoughts 0 0   PHQ-9 Score 0 0   Difficult doing work/chores Not difficult at all Not difficult at all      Information is confidential and restricted. Go to Review Flowsheets to unlock data.        Convulsions, unspecified convulsion type ([place])    Thought to be psychogenic with prior hospitalization She is following outpt with neuro and psych, no seizure like activity or convulsions  There were also multiple meds/SE/med changes and likely some withdrawal from some meds prior to her hospital encounter Continue outpt f/up with neuro and psych       Encounter for current long-term use of anticoagulants    long term per hem/onc - having crohns bleeding - will need to monitor H/H and discuss any dose adjustment with hem/onc team if anemic  Last labs showed improved H/H She has f/up with GI for crohns - would anticipate they  would repeat if concerned about more blood loss Hemoglobin  Date Value Ref Range Status  09/08/2022 12.4 12.0 - 15.0 g/dL Final  07/23/2022 11.2 (L) 12.0 - 15.0 g/dL Final  07/20/2022 11.3 (L) 12.0 - 15.0 g/dL Final  07/19/2022 12.8 12.0 - 15.0 g/dL Final  10/17/2019 12.8 11.1 - 15.9 g/dL Final  05/29/2019 11.6 11.1 - 15.9 g/dL Final  11/20/2018 11.8 11.1 - 15.9 g/dL Final  10/24/2018 12.3 11.1 - 15.9 g/dL Final           Return in about 3 months (around 12/21/2022) for Routine follow-up.   Delsa Grana, PA-C 09/21/22 10:53 AM

## 2022-09-22 ENCOUNTER — Encounter: Payer: Self-pay | Admitting: Internal Medicine

## 2022-09-22 LAB — COMPLETE METABOLIC PANEL WITH GFR
AG Ratio: 1.4 (calc) (ref 1.0–2.5)
ALT: 10 U/L (ref 6–29)
AST: 14 U/L (ref 10–35)
Albumin: 4.2 g/dL (ref 3.6–5.1)
Alkaline phosphatase (APISO): 72 U/L (ref 37–153)
BUN: 16 mg/dL (ref 7–25)
CO2: 29 mmol/L (ref 20–32)
Calcium: 10.1 mg/dL (ref 8.6–10.4)
Chloride: 102 mmol/L (ref 98–110)
Creat: 0.86 mg/dL (ref 0.50–1.05)
Globulin: 3 g/dL (calc) (ref 1.9–3.7)
Glucose, Bld: 119 mg/dL — ABNORMAL HIGH (ref 65–99)
Potassium: 3.3 mmol/L — ABNORMAL LOW (ref 3.5–5.3)
Sodium: 141 mmol/L (ref 135–146)
Total Bilirubin: 0.5 mg/dL (ref 0.2–1.2)
Total Protein: 7.2 g/dL (ref 6.1–8.1)
eGFR: 74 mL/min/{1.73_m2} (ref >=60)

## 2022-09-22 LAB — TSH: TSH: 0.35 mIU/L — ABNORMAL LOW (ref 0.40–4.50)

## 2022-09-24 ENCOUNTER — Encounter: Payer: Self-pay | Admitting: Family Medicine

## 2022-09-24 ENCOUNTER — Other Ambulatory Visit: Payer: Self-pay | Admitting: Family Medicine

## 2022-09-24 DIAGNOSIS — R569 Unspecified convulsions: Secondary | ICD-10-CM

## 2022-09-24 DIAGNOSIS — Z7901 Long term (current) use of anticoagulants: Secondary | ICD-10-CM

## 2022-09-24 HISTORY — DX: Long term (current) use of anticoagulants: Z79.01

## 2022-09-24 NOTE — Assessment & Plan Note (Signed)
Will be on long term anticoagulation with 2x PE Consults and management with hemonc reviewed with pt

## 2022-09-24 NOTE — Assessment & Plan Note (Signed)
long term per hem/onc - having crohns bleeding - will need to monitor H/H and discuss any dose adjustment with hem/onc team if anemic  Last labs showed improved H/H She has f/up with GI for crohns - would anticipate they would repeat if concerned about more blood loss Hemoglobin  Date Value Ref Range Status  09/08/2022 12.4 12.0 - 15.0 g/dL Final  07/23/2022 11.2 (L) 12.0 - 15.0 g/dL Final  07/20/2022 11.3 (L) 12.0 - 15.0 g/dL Final  07/19/2022 12.8 12.0 - 15.0 g/dL Final  10/17/2019 12.8 11.1 - 15.9 g/dL Final  05/29/2019 11.6 11.1 - 15.9 g/dL Final  11/20/2018 11.8 11.1 - 15.9 g/dL Final  10/24/2018 12.3 11.1 - 15.9 g/dL Final

## 2022-09-24 NOTE — Assessment & Plan Note (Signed)
here for recheck of TSH after dose adjustment, no noticible sx - still fatigued, no cognitive or weight changes, pt ill with other issues  For years on 50 mcg levothyroxine, with recently elevated labs dose was increased to 75 mcg daily Lab Results  Component Value Date   TSH 6.178 (H) 07/23/2022   TSH 1.05 08/04/2021   TSH 0.73 01/29/2021   TSH 1.10 03/04/2020   TSH 1.99 06/25/2019

## 2022-09-24 NOTE — Assessment & Plan Note (Signed)
Recently exacerbated with multiple meds/polypharmacy Working with psychiatry and neurology Avoid polypharmacy and sedating meds

## 2022-09-24 NOTE — Assessment & Plan Note (Addendum)
Recently established with psychiatry and is on zoloft She still needs to f/up with them and establish report and get established with therapist as well Pt did not understand the process with initial assessment, I have encouraged her to keep on medications for now (zoloft) and do f/up with psychiatry - Dr. Modesta Messing and trust the process - it will take time to get established, do assessment, get est with therapist, allow meds to work, etc Anxiety/stress/moods can exacerbate her physical sx and/or cause somatic sx - I explained the process which did start back with her hospitalization - and should continue outpt  Thyroid optimized as well - which I explained can also be related to moods/cognition She is currently extremely tired with crohns - hard to assess moods- GAD and phq are negative today    09/21/2022   10:33 AM 08/23/2022   10:29 AM 08/18/2022    4:59 PM  Depression screen PHQ 2/9  Decreased Interest 0 0   Down, Depressed, Hopeless 0 0   PHQ - 2 Score 0 0   Altered sleeping 0 0   Tired, decreased energy 0 0   Change in appetite 0 0   Feeling bad or failure about yourself  0 0   Trouble concentrating 0 0   Moving slowly or fidgety/restless 0 0   Suicidal thoughts 0 0   PHQ-9 Score 0 0   Difficult doing work/chores Not difficult at all Not difficult at all      Information is confidential and restricted. Go to Review Flowsheets to unlock data.

## 2022-09-24 NOTE — Assessment & Plan Note (Signed)
BP improved today and at goal BP Readings from Last 3 Encounters:  09/21/22 124/68  09/08/22 135/72  08/23/22 (!) 152/70  Will send in HTN meds on combo pill - continue losartan 50 HCTZ 12.5

## 2022-09-24 NOTE — Assessment & Plan Note (Signed)
Current flare - working with GI, just started steroids Following, recent specialists consult/notes reviewed, pt sx

## 2022-09-24 NOTE — Assessment & Plan Note (Addendum)
Thought to be psychogenic with prior hospitalization She is following outpt with neuro and psych, no seizure like activity or convulsions  There were also multiple meds/SE/med changes and likely some withdrawal from some meds prior to her hospital encounter Continue outpt f/up with neuro and psych

## 2022-09-24 NOTE — Assessment & Plan Note (Signed)
Lab Results  Component Value Date   CHOL 193 01/29/2021   HDL 89 01/29/2021   LDLCALC 85 01/29/2021   TRIG 91 01/29/2021   CHOLHDL 2.2 01/29/2021  Hx of stroke, on statin, overdue for recheck

## 2022-09-24 NOTE — Assessment & Plan Note (Signed)
Working with neurology, recent [facility] management after hospitalization, still having some balance/vertigo/weakness issues When d/c from [facility] will need to put in outpt PT orders

## 2022-09-24 NOTE — Assessment & Plan Note (Signed)
Pt having much more exertional sx since PE, when feeling a little better from current illness we have discussed doing cardiac or pulm rehab - she will let us know when she's ready to work on that

## 2022-09-27 ENCOUNTER — Telehealth: Payer: Self-pay

## 2022-09-27 DIAGNOSIS — K5010 Crohn's disease of large intestine without complications: Secondary | ICD-10-CM

## 2022-09-27 NOTE — Telephone Encounter (Signed)
Order lab work Informed patient and patient is going to come one day this week to have the labs done

## 2022-09-27 NOTE — Telephone Encounter (Signed)
For patient to get started on the [facility] patient is needing new labs for TB and Hepatitis B labs. Please advise if you are okay with ordering this and what labs you want to order

## 2022-09-28 DIAGNOSIS — K5010 Crohn's disease of large intestine without complications: Secondary | ICD-10-CM | POA: Diagnosis not present

## 2022-09-29 ENCOUNTER — Encounter: Payer: Self-pay | Admitting: Gastroenterology

## 2022-09-30 ENCOUNTER — Encounter: Payer: Self-pay | Admitting: Gastroenterology

## 2022-09-30 ENCOUNTER — Ambulatory Visit
Admission: RE | Admit: 2022-09-30 | Discharge: 2022-09-30 | Disposition: A | Discharge status: Home / Self Care | Payer: Medicare Other | Source: Ambulatory Visit | Attending: Gastroenterology | Admitting: Gastroenterology

## 2022-09-30 ENCOUNTER — Ambulatory Visit: Payer: Medicare Other | Admitting: Anesthesiology

## 2022-09-30 ENCOUNTER — Other Ambulatory Visit: Payer: Self-pay

## 2022-09-30 ENCOUNTER — Encounter
Admission: RE | Disposition: A | Discharge status: Home / Self Care | Payer: Self-pay | Source: Ambulatory Visit | Attending: Gastroenterology

## 2022-09-30 DIAGNOSIS — J449 Chronic obstructive pulmonary disease, unspecified: Secondary | ICD-10-CM | POA: Insufficient documentation

## 2022-09-30 DIAGNOSIS — Z09 Encounter for follow-up examination after completed treatment for conditions other than malignant neoplasm: Secondary | ICD-10-CM | POA: Insufficient documentation

## 2022-09-30 DIAGNOSIS — K219 Gastro-esophageal reflux disease without esophagitis: Secondary | ICD-10-CM | POA: Diagnosis not present

## 2022-09-30 DIAGNOSIS — I10 Essential (primary) hypertension: Secondary | ICD-10-CM | POA: Diagnosis not present

## 2022-09-30 DIAGNOSIS — I69354 Hemiplegia and hemiparesis following cerebral infarction affecting left non-dominant side: Secondary | ICD-10-CM | POA: Diagnosis not present

## 2022-09-30 DIAGNOSIS — E039 Hypothyroidism, unspecified: Secondary | ICD-10-CM | POA: Insufficient documentation

## 2022-09-30 DIAGNOSIS — I2510 Atherosclerotic heart disease of native coronary artery without angina pectoris: Secondary | ICD-10-CM | POA: Diagnosis not present

## 2022-09-30 DIAGNOSIS — Z853 Personal history of malignant neoplasm of breast: Secondary | ICD-10-CM | POA: Insufficient documentation

## 2022-09-30 DIAGNOSIS — Z87442 Personal history of urinary calculi: Secondary | ICD-10-CM | POA: Diagnosis not present

## 2022-09-30 DIAGNOSIS — K5010 Crohn's disease of large intestine without complications: Secondary | ICD-10-CM | POA: Diagnosis not present

## 2022-09-30 HISTORY — PX: COLONOSCOPY WITH PROPOFOL: SHX5780

## 2022-09-30 SURGERY — COLONOSCOPY WITH PROPOFOL
Anesthesia: General

## 2022-09-30 MED ORDER — ESMOLOL HCL 100 MG/10ML IV SOLN
INTRAVENOUS | Status: AC
  Filled 2022-09-30: qty 10

## 2022-09-30 MED ORDER — PROPOFOL 500 MG/50ML IV EMUL
INTRAVENOUS | Status: DC | PRN
  Administered 2022-09-30: 145 ug/kg/min via INTRAVENOUS

## 2022-09-30 MED ORDER — SODIUM CHLORIDE 0.9 % IV SOLN: INTRAVENOUS | Status: DC

## 2022-09-30 MED ORDER — PROPOFOL 1000 MG/100ML IV EMUL
INTRAVENOUS | Status: AC
  Filled 2022-09-30: qty 100

## 2022-09-30 NOTE — Anesthesia Preprocedure Evaluation (Signed)
Anesthesia Evaluation  Patient identified by MRN, date of birth, ID band Patient awake    Reviewed: Allergy & Precautions, NPO status , Patient's Chart, lab work & pertinent test results  History of Anesthesia Complications Negative for: history of anesthetic complications  Airway Mallampati: III  TM Distance: >3 FB Neck ROM: Full    Dental  (+) Missing, Dental Advidsory Given   Pulmonary neg shortness of breath, asthma , neg sleep apnea, COPD,  COPD inhaler, neg recent URI   breath sounds clear to auscultation- rhonchi (-) wheezing      Cardiovascular hypertension, Pt. on medications (-) angina + DVT  (-) CAD, (-) Past MI, (-) Cardiac Stents and (-) CABG (-) dysrhythmias (-) Valvular Problems/Murmurs Rhythm:Regular Rate:Normal - Systolic murmurs and - Diastolic murmurs    Neuro/Psych neg Seizures PSYCHIATRIC DISORDERS  Depression    CVA (L sided weakness), Residual Symptoms    GI/Hepatic Neg liver ROS,GERD  ,,  Endo/Other  neg diabetesHypothyroidism    Renal/GU negative Renal ROS     Musculoskeletal  (+) Arthritis ,    Abdominal  (+) - obese  Peds  Hematology negative hematology ROS (+)   Anesthesia Other Findings Past Medical History: No date: Abdominal pain, epigastric No date: Allergic rhinitis No date: Anginal pain ([place])     Comment:  states MD said it was GERD No date: Arthritis No date: Asthma 2006: Breast cancer ([place])     Comment:  LT LUMPECTOMY 05/21/2015: CD (Crohn's disease) ([place])     Comment:  FOLLOWED BY GI  No date: Clotting disorder ([place]) No date: Cognitive deficits as late effect of cerebrovascular disease No date: COPD (chronic obstructive pulmonary disease) ([place]) 12/04/2014: Crohn's disease of both small and large intestine with  rectal bleeding ([place]) No date: Depression     Comment:  Currently taking zoloft. 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 No date:  Dyspnea No date: Esophageal reflux 07/19/2018: Fever blister No date: Glaucoma     Comment:  vitreous degeneration No date: History of kidney stones No date: Hyperlipidemia No date: Hypertension No date: Hypothyroidism No date: Occlusion, cerebral artery     Comment:  NOS w/infarction No date: Osteoporosis 2006: Personal history of radiation therapy     Comment:  BREAST CA No date: Stroke ([place]) No date: Ulcer   Reproductive/Obstetrics                             Anesthesia Physical Anesthesia Plan  ASA: 3  Anesthesia Plan: General   Post-op Pain Management:    Induction: Intravenous  PONV Risk Score and Plan: 3 and Propofol infusion and TIVA  Airway Management Planned: Natural Airway  Additional Equipment:   Intra-op Plan:   Post-operative Plan:   Informed Consent: I have reviewed the patients History and Physical, chart, labs and discussed the procedure including the risks, benefits and alternatives for the proposed anesthesia with the patient or authorized representative who has indicated his/her understanding and acceptance.     Dental advisory given  Plan Discussed with: CRNA and Anesthesiologist  Anesthesia Plan Comments:         Anesthesia Quick Evaluation

## 2022-09-30 NOTE — Transfer of Care (Signed)
Immediate Anesthesia Transfer of Care Note  Patient: Carolyn Campbell  Procedure(s) Performed: COLONOSCOPY WITH PROPOFOL  Patient Location: PACU  Anesthesia Type:General  Level of Consciousness: drowsy  Airway & Oxygen Therapy: Patient Spontanous Breathing and Patient connected to nasal cannula oxygen  Post-op Assessment: Report given to RN and Post -op Vital signs reviewed and stable  Post vital signs: Reviewed and stable  Last Vitals:  Vitals Value Taken Time  BP    Temp    Pulse    Resp    SpO2      Last Pain:  Vitals:   09/30/22 0928  TempSrc: Temporal  PainSc: 0-No pain         Complications: No notable events documented.

## 2022-09-30 NOTE — Op Note (Signed)
[facility] Gastroenterology Patient Name: Carolyn Campbell Procedure Date: 09/30/2022 10:20 AM MRN: [id number] Account #: [id number] Date of Birth: [birth year] Admit Type: Outpatient Age: 67 Room: [facility] ENDO ROOM 3 Gender: Female Note Status: Finalized Instrument Name: Peds Colonoscope [id number] Procedure:             Colonoscopy Indications:           Last colonoscopy: October 2021, Follow-up of Crohn's                         disease of the colon, Disease activity assessment of                         Crohn's disease of the colon Providers:             Lin Landsman MD, MD Referring MD:          Delsa Grana (Referring MD) Medicines:             General Anesthesia Complications:         No immediate complications. Estimated blood loss: None. Procedure:             Pre-Anesthesia Assessment:                        - Prior to the procedure, a History and Physical was                         performed, and patient medications and allergies were                         reviewed. The patient is competent. The risks and                         benefits of the procedure and the sedation options and                         risks were discussed with the patient. All questions                         were answered and informed consent was obtained.                         Patient identification and proposed procedure were                         verified by the physician, the nurse, the                         anesthesiologist, the anesthetist and the technician                         in the pre-procedure area in the procedure room in the                         endoscopy suite. Mental Status Examination: alert and                         oriented. Airway Examination: normal oropharyngeal  airway and neck mobility. Respiratory Examination:                         clear to auscultation. CV Examination: normal.                         Prophylactic  Antibiotics: The patient does not require                         prophylactic antibiotics. Prior Anticoagulants: The                         patient has taken Eliquis (apixaban), last dose was 2                         days prior to procedure. ASA Grade Assessment: III - A                         patient with severe systemic disease. After reviewing                         the risks and benefits, the patient was deemed in                         satisfactory condition to undergo the procedure. The                         anesthesia plan was to use general anesthesia.                         Immediately prior to administration of medications,                         the patient was re-assessed for adequacy to receive                         sedatives. The heart rate, respiratory rate, oxygen                         saturations, blood pressure, adequacy of pulmonary                         ventilation, and response to care were monitored                         throughout the procedure. The physical status of the                         patient was re-assessed after the procedure.                        After obtaining informed consent, the colonoscope was                         passed under direct vision. Throughout the procedure,                         the patient's blood pressure, pulse, and oxygen  saturations were monitored continuously. The                         Colonoscope was introduced through the anus and                         advanced to the 10 cm into the ileum. The colonoscopy                         was performed without difficulty. The patient                         tolerated the procedure well. The quality of the bowel                         preparation was evaluated using the BBPS [facility] Bowel                         Preparation Scale) with scores of: Right Colon = 3,                         Transverse Colon = 3 and Left Colon = 3 (entire  mucosa                         seen well with no residual staining, small fragments                         of stool or opaque liquid). The total BBPS score                         equals 9. The terminal ileum, ileocecal valve,                         appendiceal orifice, and rectum were photographed. Findings:      The perianal and digital rectal examinations were normal. Pertinent       negatives include normal sphincter tone and no palpable rectal lesions.      The terminal ileum appeared normal.      The Simple Endoscopic Score for Crohn's Disease was determined based on       the endoscopic appearance of the mucosa in the following segments:      - Ileum: Findings include no ulcers present, no ulcerated surfaces, no       affected surfaces and no narrowings. Segment score: 0.      - Right Colon: Findings include no ulcers present, no ulcerated       surfaces, no affected surfaces and no narrowings. Segment score: 0.      - Transverse Colon: Findings include no ulcers present, no ulcerated       surfaces, no affected surfaces and no narrowings. Segment score: 0.      - Left Colon: Findings include no ulcers present, no ulcerated surfaces,       no affected surfaces and no narrowings. Segment score: 0.      - Rectum: Findings include no ulcers present, no ulcerated surfaces,       greater than 75% of surfaces affected and no narrowings. Segment score:       3.      -  Total SES-CD aggregate score: 3. Biopsies were taken with a cold       forceps for histology.      The retroflexed view of the distal rectum and anal verge was normal and       showed no anal or rectal abnormalities. Impression:            - The examined portion of the ileum was normal.                        - Simple Endoscopic Score for Crohn's Disease: 3,                         mucosal inflammatory changes secondary to Crohn's                         disease with colonic involvement. Biopsied.                         - The distal rectum and anal verge are normal on                         retroflexion view. Recommendation:        - Discharge patient to home (with escort).                        - Resume previous diet today.                        - Continue present medications.                        - Do not restart lovenox                        - Resume Eliquis (apixaban) today at prior dose. Refer                         to managing physician for further adjustment of                         therapy. Procedure Code(s):     --- Professional ---                        [phone], Colonoscopy, flexible; with biopsy, single or                         multiple Diagnosis Code(s):     --- Professional ---                        K50.10, Crohn's disease of large intestine without                         complications CPT copyright 2022 [employer]. All rights reserved. The codes documented in this report are preliminary and upon coder review may  be revised to meet current compliance requirements. Dr. Ulyess Mort Lin Landsman MD, MD 09/30/2022 10:57:49 AM This report has been signed electronically. Number of Addenda: 0 Note Initiated On: 09/30/2022 10:20 AM Scope Withdrawal Time: 0 hours 10 minutes 53  seconds  Total Procedure Duration: 0 hours 13 minutes 6 seconds  Estimated Blood Loss:  Estimated blood loss: none.      [facility]

## 2022-09-30 NOTE — H&P (Signed)
[name], MD [address]  [place]  [place], [place] [address]  Main: [phone]  Fax: [phone] Pager: [phone]  Primary Care Physician:  Delsa Grana, PA-C Primary Gastroenterologist:  Dr. [name]  Pre-Procedure History & Physical: HPI:  Carolyn Campbell is a 67 y.o. female is here for an colonoscopy.   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 05/2021 Echo: EF 60-65%, no rwma, nl RV fxn.   History of kidney stones    Hypercholesterolemia 01/18/2007   Hyperlipidemia    Hypertension    Hypothyroidism    Low back pain radiating to left leg 06/17/2021   Nonobstructive CAD (coronary artery disease)    a. 10/2012 Cath: diffuse minor irregs-->med rx; b. 08/2021 Cor CTA: Ca2+ = 55.6 (77th%'ile), LAD 25p, LCX <25p. No signif non-cardiac findings.   Occlusion, cerebral artery    NOS w/infarction   Osteoporosis    Overweight (BMI 25.0-29.9) 07/17/2022   Personal history of radiation therapy 2006   BREAST CA   Pulmonary embolism ([place])    a.04/2021 CTA Chest: Small filling defects are noted in  upper and lower lobe branches of L PA-->acute PE-->eliquis. *PE occurred in context of COVID infxn.   Stroke [facility])    Ulcer     Past Surgical History:  Procedure Laterality Date   BREAST BIOPSY Left 2006   POS   BREAST LUMPECTOMY Left 09/20/2004   positive   BREAST SURGERY Left    malignant biopsy   CARDIAC CATHETERIZATION     COLONOSCOPY  2015   COLONOSCOPY WITH PROPOFOL N/A 06/08/2017   Procedure: COLONOSCOPY WITH PROPOFOL;  Surgeon: Lin Landsman, MD;  Location: [facility];  Service: Gastroenterology;  Laterality: N/A;   COLONOSCOPY WITH PROPOFOL N/A 03/08/2019   Procedure: COLONOSCOPY WITH PROPOFOL;  Surgeon: Lin Landsman, MD;  Location: [facility] ENDOSCOPY;  Service: Gastroenterology;  Laterality: N/A;   COLONOSCOPY WITH PROPOFOL N/A 07/16/2020   Procedure: COLONOSCOPY WITH PROPOFOL;  Surgeon: Lin Landsman, MD;  Location: [facility] ENDOSCOPY;  Service: Gastroenterology;  Laterality: N/A;   ESOPHAGOGASTRODUODENOSCOPY  2015   ESOPHAGOGASTRODUODENOSCOPY (EGD) WITH PROPOFOL N/A 06/08/2017   Procedure: ESOPHAGOGASTRODUODENOSCOPY (EGD) WITH PROPOFOL;  Surgeon: Lin Landsman, MD;  Location: [facility];  Service: Gastroenterology;  Laterality: N/A;   ESOPHAGOGASTRODUODENOSCOPY (EGD) WITH PROPOFOL N/A 03/08/2019   Procedure: ESOPHAGOGASTRODUODENOSCOPY (EGD) WITH PROPOFOL;  Surgeon: Lin Landsman, MD;  Location: [facility] ENDOSCOPY;  Service: Gastroenterology;  Laterality: N/A;   FINGER SURGERY     Right small finger  FRACTURE SURGERY     GIVENS CAPSULE STUDY  2015   TUBAL LIGATION      Prior to Admission medications   Medication Sig Start Date End Date Taking? Authorizing Provider  levothyroxine (SYNTHROID) 75 MCG tablet Take 1 tablet (75 mcg total) by mouth daily before breakfast. 08/03/22  Yes Delsa Grana, PA-C  losartan-hydrochlorothiazide (HYZAAR) 50-12.5 MG tablet Take 1 tablet by mouth daily. 09/21/22  Yes Delsa Grana, PA-C  predniSONE (DELTASONE)  10 MG tablet Take 4 tablets (40 mg total) by mouth daily with breakfast for 14 days, THEN 3 tablets (30 mg total) daily with breakfast for 7 days, THEN 2 tablets (20 mg total) daily with breakfast for 7 days, THEN 1 tablet (10 mg total) daily with breakfast for 7 days. 09/19/22 10/24/22 Yes Tomeca Helm, Tally Due, MD  rosuvastatin (CRESTOR) 40 MG tablet Take 1 tablet (40 mg total) by mouth daily. 06/01/22 05/27/23 Yes Delsa Grana, PA-C  sertraline (ZOLOFT) 50 MG tablet Take 1 tablet (50 mg total) by mouth daily. 06/03/22 09/30/22 Yes Hisada, Elie Goody, MD  apixaban (ELIQUIS) 5 MG TABS tablet Take 1 tablet (5 mg total) by mouth 2 (two) times daily. 07/25/22 09/23/22  Little Ishikawa, MD  brimonidine (ALPHAGAN) 0.2 % ophthalmic solution Place 1 drop into both eyes in the morning and at bedtime.    [provider]  enoxaparin (LOVENOX) 60 MG/0.6ML injection Inject 0.6 mLs (60 mg total) into the skin every 12 (twelve) hours for 3 doses. Inject Lovenox 60 mg in a.m. and p.m. on 09/28/2022. Inject Lovenox 60 mg in a.m. on 09/29/2022 then stop. 09/16/22 09/18/22  Jane Canary, MD  latanoprost (XALATAN) 0.005 % ophthalmic solution Place 1 drop into both eyes at bedtime.  04/22/16   [provider]  Multiple Vitamin (MULTIVITAMIN) tablet Take 1 tablet by mouth daily.    [provider]  promethazine (PHENERGAN) 12.5 MG tablet Take 1 tablet (12.5 mg total) by mouth every 6 (six) hours as needed for nausea or vomiting. 09/15/22   Lin Landsman, MD    Allergies as of 09/08/2022   (No Known Allergies)    Family History  Problem Relation Age of Onset   Heart disease Mother    Heart attack Mother    Cerebrovascular Accident Father    Arthritis Father    Hypertension Father    Dementia Father    Breast cancer Sister    Breast cancer Sister 92   Cancer Sister    Hypertension Other    Diabetes Other    Heart attack Other 77   Mental illness Neg Hx     Social History    Socioeconomic History   Marital status: Married    Spouse name: Proofreader   Number of children: 2   Years of education: Not on file   Highest education level: Some college, no degree  Occupational History   Not on file  Tobacco Use   Smoking status: Never   Smokeless tobacco: Never  Vaping Use   Vaping Use: Never used  Substance and Sexual Activity   Alcohol use: No    Alcohol/week: 0.0 standard drinks of alcohol   Drug use: No   Sexual activity: Yes    Partners: Male    Birth control/protection: None  Other Topics Concern   Not on file  Social History Narrative   Married. One son and one daughter. She is disabled after she had breast cancer and strokes. 2 caffeinated beverages daily.   Social Determinants  of Health   Financial Resource Strain: Low Risk  (11/06/2019)   Overall Financial Resource Strain (CARDIA)    Difficulty of Paying Living Expenses: Not hard at all  Food Insecurity: No Food Insecurity (07/20/2022)   Hunger Vital Sign    Worried About Running Out of Food in the Last Year: Never true    Ran Out of Food in the Last Year: Never true  Transportation Needs: No Transportation Needs (07/20/2022)   PRAPARE - Hydrologist (Medical): No    Lack of Transportation (Non-Medical): No  Physical Activity: Insufficiently Active (11/06/2019)   Exercise Vital Sign    Days of Exercise per Week: 2 days    Minutes of Exercise per Session: 30 min  Stress: No Stress Concern Present (11/06/2019)   [place]    Feeling of Stress : Not at all  Social Connections: Moderately Integrated (11/06/2019)   Social Connection and Isolation Panel [NHANES]    Frequency of Communication with Friends and Family: More than three times a week    Frequency of Social Gatherings with Friends and Family: Three times a week    Attends Religious Services: More than 4 times per year    Active Member of Clubs  or Organizations: No    Attends Archivist Meetings: Never    Marital Status: Married  Human resources officer Violence: Not At Risk (07/20/2022)   Humiliation, Afraid, Rape, and Kick questionnaire    Fear of Current or Ex-Partner: No    Emotionally Abused: No    Physically Abused: No    Sexually Abused: No    Review of Systems: See HPI, otherwise negative ROS  Physical Exam: BP (!) 157/64   Pulse 69   Temp 98.4 F (36.9 C) (Temporal)   Resp 16   Ht 5' (1.524 m)   Wt 64 kg   SpO2 99%   BMI 27.54 kg/m  General:   Alert,  pleasant and cooperative in NAD Head:  Normocephalic and atraumatic. Neck:  Supple; no masses or thyromegaly. Lungs:  Clear throughout to auscultation.    Heart:  Regular rate and rhythm. Abdomen:  Soft, nontender and nondistended. Normal bowel sounds, without guarding, and without rebound.   Neurologic:  Alert and  oriented x4;  grossly normal neurologically.  Impression/Plan: Carolyn Campbell is here for an colonoscopy to be performed for h/o crohn's colitis  Risks, benefits, limitations, and alternatives regarding  colonoscopy have been reviewed with the patient.  Questions have been answered.  All parties agreeable.   [name], MD  09/30/2022, 9:43 AM

## 2022-09-30 NOTE — Anesthesia Postprocedure Evaluation (Signed)
Anesthesia Post Note  Patient: Carolyn Campbell  Procedure(s) Performed: COLONOSCOPY WITH PROPOFOL  Patient location during evaluation: Endoscopy Anesthesia Type: General Level of consciousness: awake and alert Pain management: pain level controlled Vital Signs Assessment: post-procedure vital signs reviewed and stable Respiratory status: spontaneous breathing, nonlabored ventilation, respiratory function stable and patient connected to nasal cannula oxygen Cardiovascular status: blood pressure returned to baseline and stable Postop Assessment: no apparent nausea or vomiting Anesthetic complications: no   No notable events documented.   Last Vitals:  Vitals:   09/30/22 1107 09/30/22 1117  BP:  (!) 153/61  Pulse: 69 64  Resp:    Temp:    SpO2: 100% 100%    Last Pain:  Vitals:   09/30/22 1117  TempSrc:   PainSc: 0-No pain                 Martha Clan

## 2022-10-01 ENCOUNTER — Encounter: Payer: Self-pay | Admitting: Gastroenterology

## 2022-10-01 LAB — SURGICAL PATHOLOGY

## 2022-10-02 LAB — HEPATITIS B SURFACE ANTIGEN: Hepatitis B Surface Ag: NEGATIVE

## 2022-10-02 LAB — HEPATITIS B CORE ANTIBODY, TOTAL: Hep B Core Total Ab: NEGATIVE

## 2022-10-02 LAB — HEPATITIS B SURFACE ANTIBODY,QUALITATIVE: Hep B Surface Ab, Qual: REACTIVE

## 2022-10-02 LAB — QUANTIFERON-TB GOLD PLUS
QuantiFERON Mitogen Value: >10 IU/mL
QuantiFERON Nil Value: 0.04 IU/mL
QuantiFERON TB1 Ag Value: 0.03 IU/mL
QuantiFERON TB2 Ag Value: 0.02 IU/mL
QuantiFERON-TB Gold Plus: NEGATIVE

## 2022-10-03 IMAGING — MG MM DIGITAL SCREENING BILAT W/ TOMO AND CAD
8 series · 8 of 24 positions shown · non-contrast
Comparison: Previous exam(s).

CLINICAL DATA: Screening.

EXAM:
DIGITAL SCREENING BILATERAL MAMMOGRAM WITH TOMO AND CAD

[L CC synth-2D]
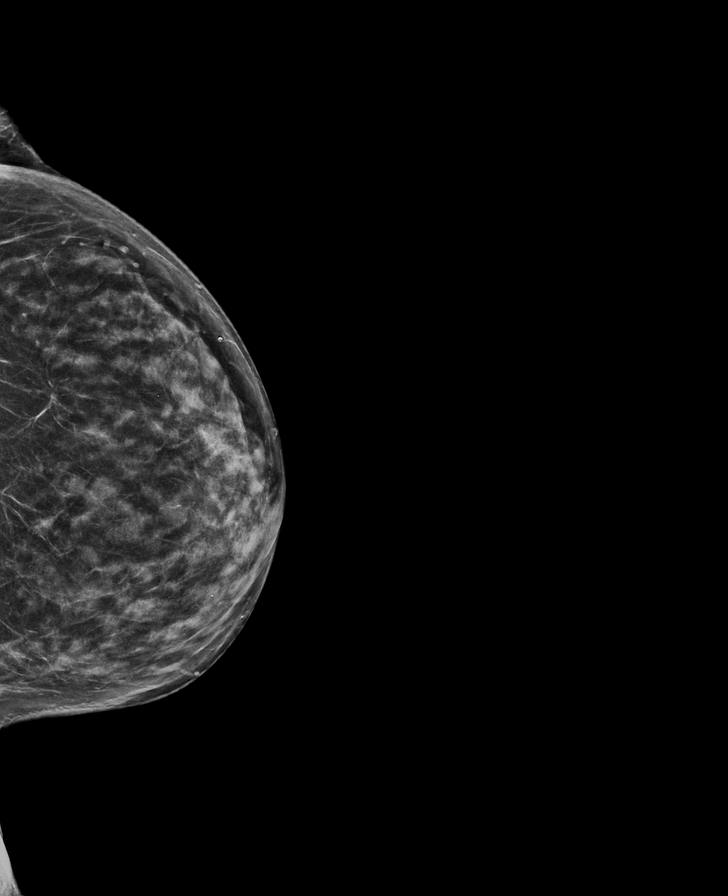

[R CC synth-2D]
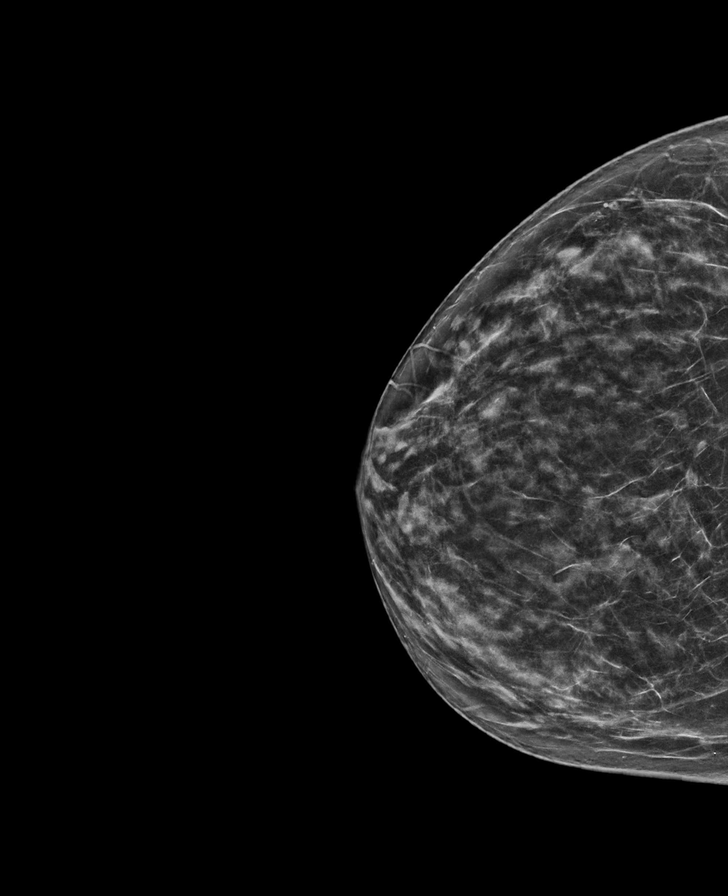

[L MLO synth-2D]
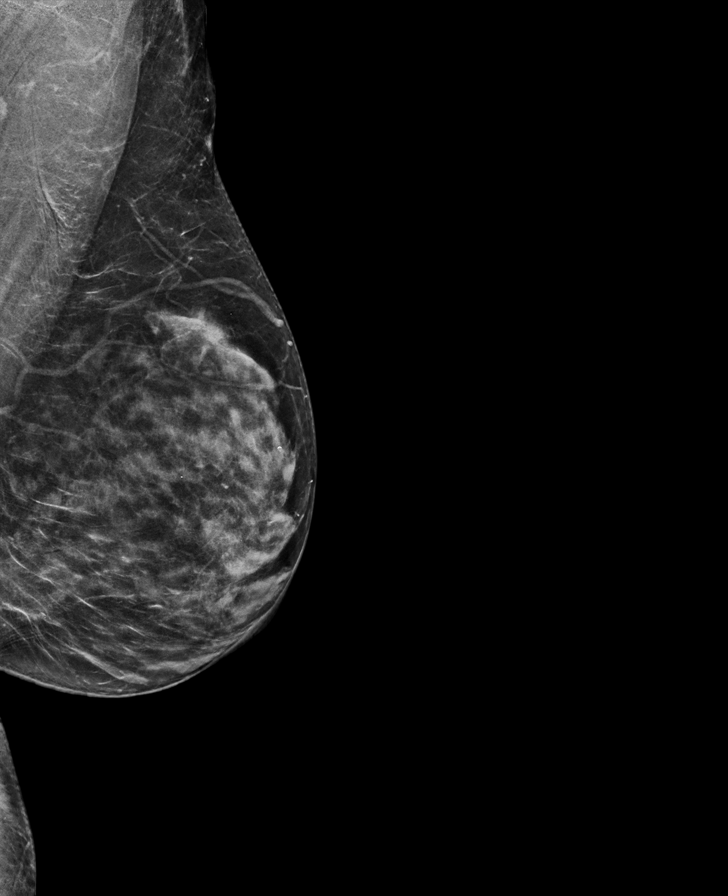

[R MLO synth-2D]
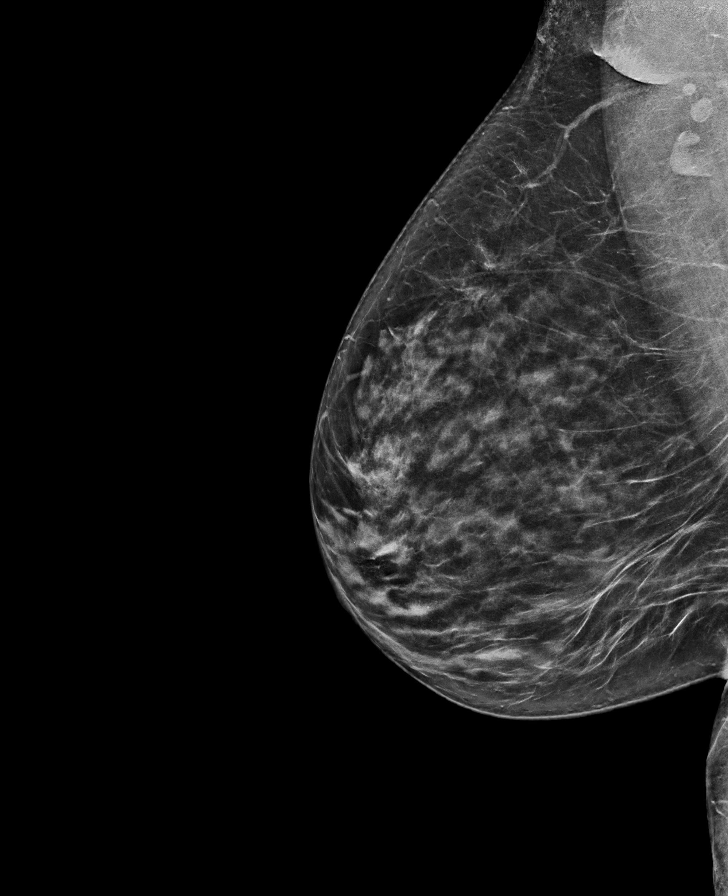

[L CC tomo · tomo slice 31/61.0]
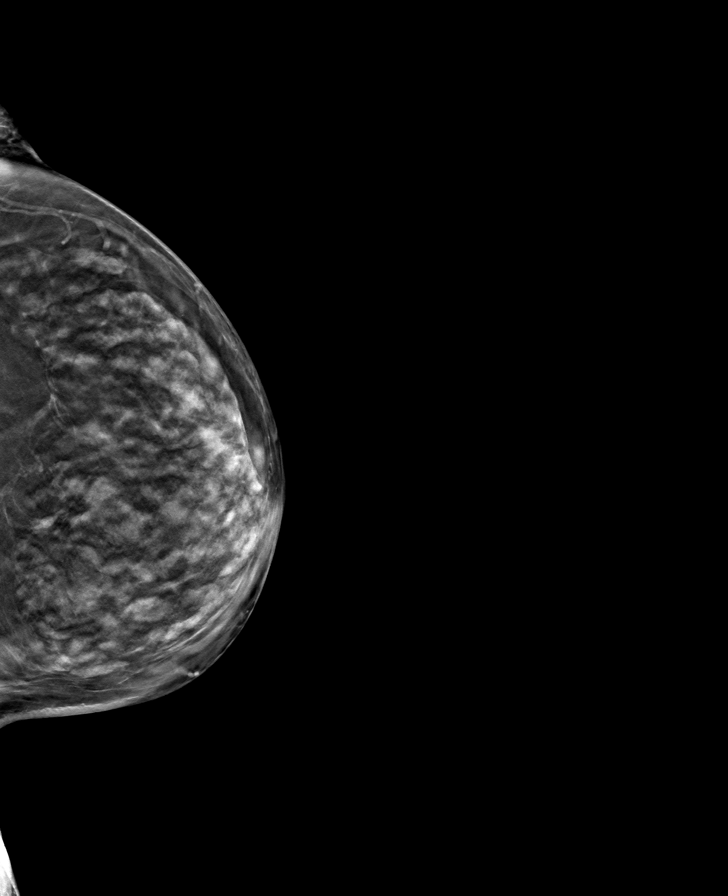

[R CC tomo · tomo slice 35/69.0]
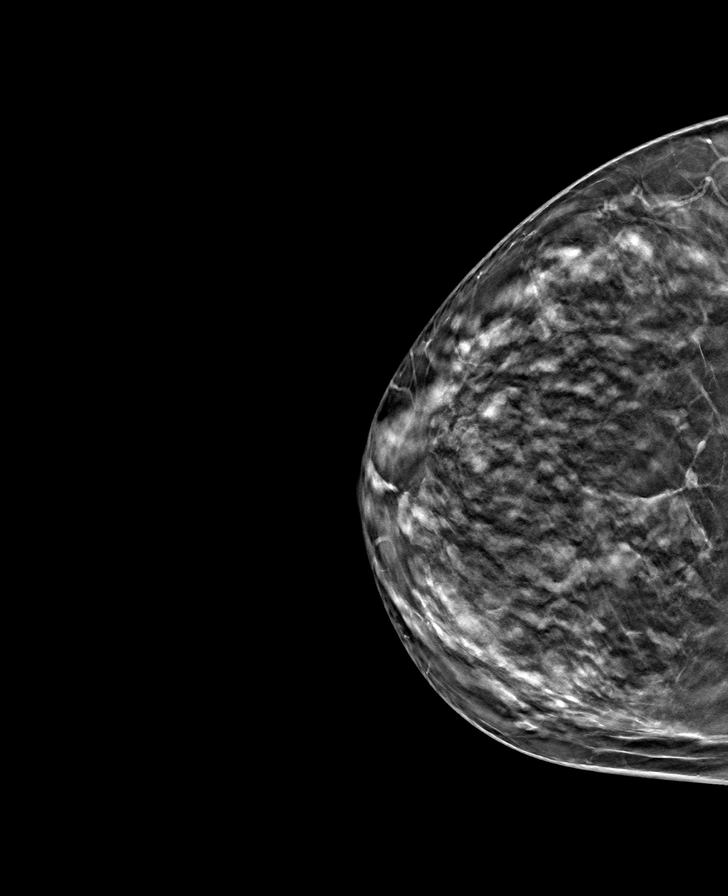

[R MLO tomo · tomo slice 37/72.0]
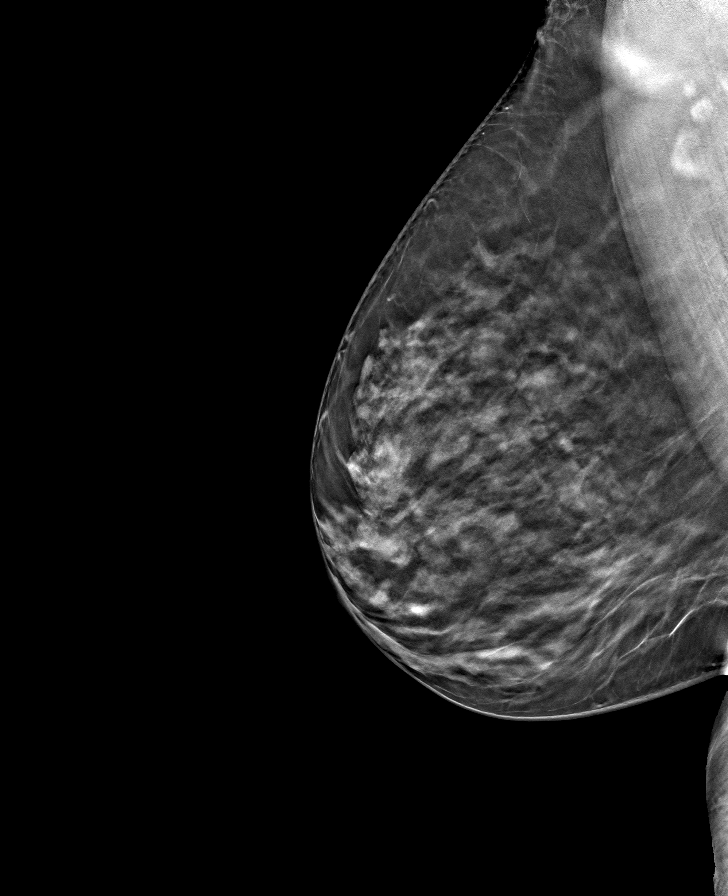

[L MLO tomo · tomo slice 31/62.0]
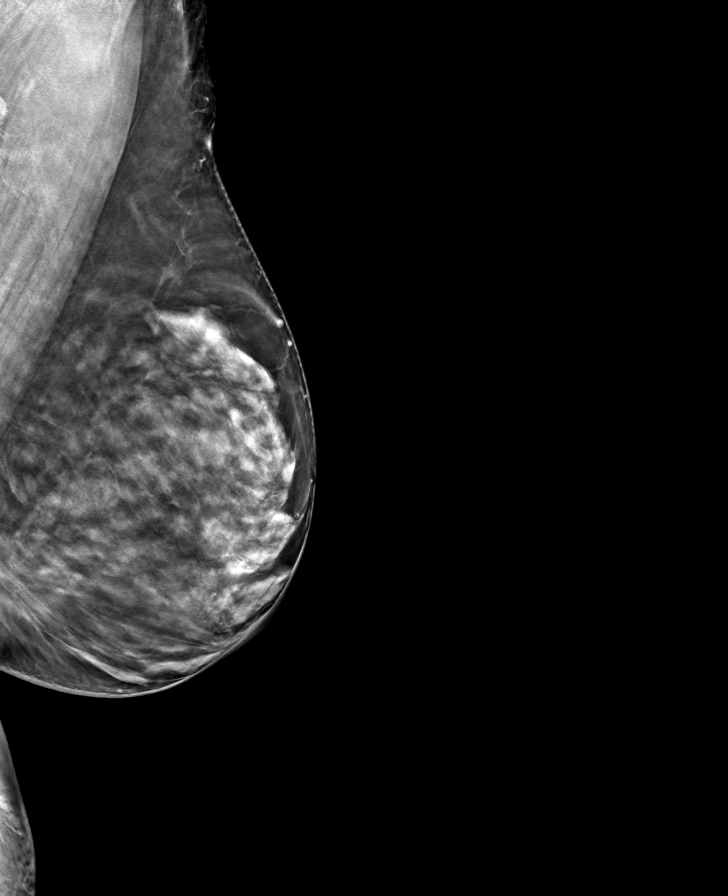

[8 of 24 positions shown; findings below may reference images not displayed]

ACR Breast Density Category c: The breast tissue is heterogeneously
dense, which may obscure small masses.
FINDINGS: There are no findings suspicious for malignancy. The images were
evaluated with computer-aided detection.
IMPRESSION: No mammographic evidence of malignancy. A result letter of this
screening mammogram will be mailed directly to the patient.

RECOMMENDATION:
Screening mammogram in one year. (Code:[id number])

BI-RADS CATEGORY  1: Negative.

## 2022-10-04 NOTE — Telephone Encounter (Signed)
PA is going to have to be done at either location. [facility] was waiting on the labs so they can complete the PA. They have the labs now and are working on the [place]

## 2022-10-06 ENCOUNTER — Telehealth: Payer: Self-pay | Admitting: Gastroenterology

## 2022-10-06 NOTE — Telephone Encounter (Signed)
Patient called back and states she will stay with [facility]

## 2022-10-06 NOTE — Telephone Encounter (Signed)
Pt called inquiring if its okay to start taking her entyvio injections In [place]. Please return call [phone] or [phone]

## 2022-10-06 NOTE — Telephone Encounter (Signed)
Called patient and left a message for call back. [place] states that she would have to see there doctors first and then we have to do the PA. [facility] has submitted the PA as urgent

## 2022-10-06 NOTE — Telephone Encounter (Signed)
[facility] is working on the prior authorization. They submitted it on 10/05/2022 and they marked It as urgent. [facility] where patient use to get the infusion at. Talk to April and she said that she would have to have a appointment with there rheumatologist first. We would have to send over the orders and submit the PA and get it approved by her insurance. They states also they only do labs every 4 months if we wanted labs more then this then we would have to order them our selfs.

## 2022-10-07 NOTE — Telephone Encounter (Signed)
Patient is okay with going to same day surgery for the Entyvio. Patient states she needs this as soon as possible. Filled out order form and faxed to same day surgery. Called same day surgery and talk to [facility] and she will get patient schedule for the [facility]

## 2022-10-07 NOTE — Telephone Encounter (Signed)
Per Sharrie Rothman with [facility]  Bo Mcclintock I just left you a message to call and after All that and what I was afraid of-  because she is medicare eligible- in the home falls under her part D coverage.  Sometimes it's a wash and no problem but her part D has a huge copay so shes better off going to an outpatient location and will be covered by her medical coverage and not her drug plan - she can't use the copay card due to having medicare  Asked her if she thought her insurance would cover her to go to same day surgery and she stated I just looked at her insurance and she would most likely be covered 100% outpatient because her united would pick up 80 and her VA the other 20. will [facility] not do them anymore?

## 2022-10-07 NOTE — Telephone Encounter (Signed)
Carolyn Campbell has been approved Approved on January 17 Request Reference Number: [id number]. ENTYVIO INJ '300MG'$  is approved through 09/20/2023. Your patient may now fill this prescription and it will be covered. Emailed Sharrie Rothman with [facility] to inform her

## 2022-10-11 ENCOUNTER — Other Ambulatory Visit: Payer: Self-pay

## 2022-10-11 ENCOUNTER — Encounter
Admission: RE | Admit: 2022-10-11 | Discharge: 2022-10-11 | Disposition: A | Discharge status: Home / Self Care | Payer: Medicare Other | Source: Ambulatory Visit | Attending: Gastroenterology | Admitting: Gastroenterology

## 2022-10-11 DIAGNOSIS — K5010 Crohn's disease of large intestine without complications: Secondary | ICD-10-CM | POA: Diagnosis not present

## 2022-10-11 MED ORDER — VEDOLIZUMAB 300 MG IV SOLR
300.0000 mg | Freq: Once | INTRAVENOUS | Status: AC
  Administered 2022-10-11: 300 mg via INTRAVENOUS
  Filled 2022-10-11: qty 5

## 2022-10-11 MED ORDER — SODIUM CHLORIDE (PF) 0.9 % IJ SOLN
INTRAMUSCULAR | Status: AC
  Filled 2022-10-11: qty 10

## 2022-10-11 MED ORDER — ENTYVIO 300 MG IV SOLR: INTRAVENOUS | 12 refills | Status: AC

## 2022-10-22 ENCOUNTER — Encounter: Payer: Self-pay | Admitting: Gastroenterology

## 2022-10-22 DIAGNOSIS — K625 Hemorrhage of anus and rectum: Secondary | ICD-10-CM

## 2022-10-25 ENCOUNTER — Telehealth: Payer: Self-pay

## 2022-10-25 ENCOUNTER — Ambulatory Visit
Admission: RE | Admit: 2022-10-25 | Discharge: 2022-10-25 | Disposition: A | Discharge status: Home / Self Care | Payer: Medicare Other | Source: Ambulatory Visit | Attending: Gastroenterology | Admitting: Gastroenterology

## 2022-10-25 VITALS — BP 144/59 | HR 74 | Temp 97.8°F | Resp 16 | Ht 60.0 in | Wt 142.0 lb

## 2022-10-25 DIAGNOSIS — K5090 Crohn's disease, unspecified, without complications: Secondary | ICD-10-CM | POA: Diagnosis not present

## 2022-10-25 DIAGNOSIS — I4891 Unspecified atrial fibrillation: Secondary | ICD-10-CM | POA: Diagnosis not present

## 2022-10-25 DIAGNOSIS — R000 Tachycardia, unspecified: Secondary | ICD-10-CM | POA: Diagnosis not present

## 2022-10-25 DIAGNOSIS — K50811 Crohn's disease of both small and large intestine with rectal bleeding: Secondary | ICD-10-CM

## 2022-10-25 LAB — CBC
HCT: 35.6 % — ABNORMAL LOW (ref 36.0–46.0)
Hemoglobin: 11.7 g/dL — ABNORMAL LOW (ref 12.0–15.0)
MCH: 29.2 pg (ref 26.0–34.0)
MCHC: 32.9 g/dL (ref 30.0–36.0)
MCV: 88.8 fL (ref 80.0–100.0)
Platelets: 208 10*3/uL (ref 150–400)
RBC: 4.01 MIL/uL (ref 3.87–5.11)
RDW: 13.4 % (ref 11.5–15.5)
WBC: 7.8 10*3/uL (ref 4.0–10.5)
nRBC: 0 % (ref 0.0–0.2)

## 2022-10-25 MED ORDER — VEDOLIZUMAB 300 MG IV SOLR
300.0000 mg | Freq: Once | INTRAVENOUS | Status: AC
  Administered 2022-10-25: 300 mg via INTRAVENOUS
  Filled 2022-10-25: qty 5

## 2022-10-25 MED ORDER — PREDNISONE 10 MG PO TABS: 20.0000 mg | ORAL_TABLET | Freq: Every day | ORAL | 0 refills | Status: AC

## 2022-10-25 NOTE — Addendum Note (Signed)
Addended by: Ulyess Blossom L on: 10/25/2022 03:20 PM   Modules accepted: Orders

## 2022-10-25 NOTE — Telephone Encounter (Signed)
Patient states when she was on the prednisone she does think her symptoms were better with the medication

## 2022-10-25 NOTE — Telephone Encounter (Signed)
Patient states she did take the prednisone and finished it. She states she is is willing to take it again if you feel like it will help her. Please call it in to [facility]

## 2022-10-25 NOTE — Telephone Encounter (Signed)
-----   Message from Lin Landsman, MD sent at 10/25/2022 12:36 PM EST ----- Caryl Pina  Please inform patient that her hemoglobin slightly dropped.  Please find out if she completed prednisone course and if it helped.  If yes, please check with her if she is willing to do another short course of prednisone  Thanks [employer]

## 2022-10-25 NOTE — Telephone Encounter (Signed)
Please send in prescription for prednisone 20 mg daily for 4 weeks  RV

## 2022-10-25 NOTE — Progress Notes (Signed)
Patient here for her second entyvio treatment and she tolerated the infusion well.  VSS. No complaints of pain or any reactions.  Plan for next treatment in 4 weeks.

## 2022-10-25 NOTE — Telephone Encounter (Signed)
Sent medication to the pharmacy  

## 2022-10-27 ENCOUNTER — Ambulatory Visit: Payer: Medicare Other | Admitting: Gastroenterology

## 2022-11-11 ENCOUNTER — Encounter: Payer: Self-pay | Admitting: Family Medicine

## 2022-11-15 ENCOUNTER — Ambulatory Visit (INDEPENDENT_AMBULATORY_CARE_PROVIDER_SITE_OTHER): Payer: Medicare Other | Admitting: Family Medicine

## 2022-11-15 ENCOUNTER — Encounter: Payer: Self-pay | Admitting: Family Medicine

## 2022-11-15 VITALS — BP 134/66 | HR 83 | Temp 98.0°F | Resp 16 | Ht 60.0 in | Wt 147.0 lb

## 2022-11-15 DIAGNOSIS — H02831 Dermatochalasis of right upper eyelid: Secondary | ICD-10-CM | POA: Diagnosis not present

## 2022-11-15 DIAGNOSIS — H43813 Vitreous degeneration, bilateral: Secondary | ICD-10-CM | POA: Diagnosis not present

## 2022-11-15 DIAGNOSIS — H35371 Puckering of macula, right eye: Secondary | ICD-10-CM | POA: Diagnosis not present

## 2022-11-15 DIAGNOSIS — Z9889 Other specified postprocedural states: Secondary | ICD-10-CM

## 2022-11-15 DIAGNOSIS — H04123 Dry eye syndrome of bilateral lacrimal glands: Secondary | ICD-10-CM | POA: Diagnosis not present

## 2022-11-15 DIAGNOSIS — H401132 Primary open-angle glaucoma, bilateral, moderate stage: Secondary | ICD-10-CM | POA: Diagnosis not present

## 2022-11-15 DIAGNOSIS — Z853 Personal history of malignant neoplasm of breast: Secondary | ICD-10-CM | POA: Diagnosis not present

## 2022-11-15 DIAGNOSIS — N6489 Other specified disorders of breast: Secondary | ICD-10-CM | POA: Diagnosis not present

## 2022-11-15 DIAGNOSIS — H02834 Dermatochalasis of left upper eyelid: Secondary | ICD-10-CM | POA: Diagnosis not present

## 2022-11-15 DIAGNOSIS — H26493 Other secondary cataract, bilateral: Secondary | ICD-10-CM | POA: Diagnosis not present

## 2022-11-15 DIAGNOSIS — Z961 Presence of intraocular lens: Secondary | ICD-10-CM | POA: Diagnosis not present

## 2022-11-15 NOTE — Progress Notes (Signed)
Patient ID: Carolyn Campbell, female    DOB: [birth year], 67 y.o.   MRN: [id number]  PCP: Carolyn Grana, PA-C  Chief Complaint  Patient presents with   Consult    Pt requesting prescription for bras.    Subjective:   Carolyn Campbell is a 67 y.o. female, presents to clinic with CC of the following:  HPI  Here for DME orders with hx of breast CA and surgeries resulting in asymmetrical breast. She is going to medical supply store to get bra and prosthetics     Patient Active Problem List   Diagnosis Date Noted   Crohn's disease of large intestine without complication ([place]) 99991111   Convulsions, unspecified convulsion type ([place]) 09/24/2022   Encounter for current long-term use of anticoagulants 09/24/2022   MDD (major depressive disorder), recurrent episode, mild ([place]) 08/18/2022   Restless leg syndrome 08/18/2022   Pulmonary embolism ([place]) 07/16/2022   Pain in limb 11/10/2021   Nonobstructive CAD (coronary artery disease) 08/06/2021   Other long term (current) drug therapy 01/22/2021   Varicose veins of both lower extremities with pain 07/19/2018   Arthritis of right hand 12/21/2017   Major depression in remission ([place]) 12/21/2017   Fibrocystic breast changes 06/22/2017   Osteopenia 10/18/2016   Atherosclerosis of aorta ([place]) 12/19/2015   Vitreous degeneration 05/21/2015   Glaucoma associated with chamber angle anomalies 05/21/2015   H/O malignant neoplasm of breast 05/21/2015   Crohn's disease of both small and large intestine with rectal bleeding ([place]) 12/04/2014   Solitary pulmonary nodule 11/07/2012   Cognitive deficits as late effect of cerebrovascular disease 01/20/2010   CVA, old, hemiparesis ([place]) 04/28/2009   Acquired hypothyroidism 06/12/2008   HLD (hyperlipidemia) 01/18/2007   Primary hypertension 01/18/2007      Current Outpatient Medications:    brimonidine (ALPHAGAN) 0.2 % ophthalmic solution, Place 1 drop into both eyes in the morning and at bedtime.,  Disp: , Rfl:    latanoprost (XALATAN) 0.005 % ophthalmic solution, Place 1 drop into both eyes at bedtime. , Disp: , Rfl:    levothyroxine (SYNTHROID) 75 MCG tablet, Take 1 tablet (75 mcg total) by mouth daily before breakfast., Disp: 90 tablet, Rfl: 0   losartan-hydrochlorothiazide (HYZAAR) 50-12.5 MG tablet, Take 1 tablet by mouth daily., Disp: 90 tablet, Rfl: 2   Multiple Vitamin (MULTIVITAMIN) tablet, Take 1 tablet by mouth daily., Disp: , Rfl:    predniSONE (DELTASONE) 10 MG tablet, Take 2 tablets (20 mg total) by mouth daily with breakfast for 28 days., Disp: 56 tablet, Rfl: 0   rosuvastatin (CRESTOR) 40 MG tablet, Take 1 tablet (40 mg total) by mouth daily., Disp: 90 tablet, Rfl: 0   vedolizumab (ENTYVIO) 300 MG injection, Induction Entyvio '300mg'$  at week 0,2, and 6 and then maintenance does every 8 weeks, Disp: 1 each, Rfl: 12   apixaban (ELIQUIS) 5 MG TABS tablet, Take 1 tablet (5 mg total) by mouth 2 (two) times daily., Disp: 60 tablet, Rfl: 1   promethazine (PHENERGAN) 12.5 MG tablet, Take 1 tablet (12.5 mg total) by mouth every 6 (six) hours as needed for nausea or vomiting., Disp: 30 tablet, Rfl: 0   sertraline (ZOLOFT) 50 MG tablet, Take 1 tablet (50 mg total) by mouth daily., Disp: 30 tablet, Rfl: 2   No Known Allergies   Social History   Tobacco Use   Smoking status: Never   Smokeless tobacco: Never  Vaping Use   Vaping Use: Never used  Substance Use Topics  Alcohol use: No    Alcohol/week: 0.0 standard drinks of alcohol   Drug use: No      Chart Review Today: I personally reviewed active problem list, medication list, allergies, family history, social history, health maintenance, notes from last encounter, lab results, imaging with the patient/caregiver today.   Review of Systems  Constitutional: Negative.   HENT: Negative.    Eyes: Negative.   Respiratory: Negative.    Cardiovascular: Negative.   Gastrointestinal: Negative.   Endocrine: Negative.    Genitourinary: Negative.   Musculoskeletal: Negative.   Skin: Negative.   Allergic/Immunologic: Negative.   Neurological: Negative.   Hematological: Negative.   Psychiatric/Behavioral: Negative.    All other systems reviewed and are negative.      Objective:   Vitals:   11/15/22 1016  BP: 134/66  Pulse: 83  Resp: 16  Temp: 98 F (36.7 C)  TempSrc: Oral  SpO2: 95%  Weight: 147 lb (66.7 kg)  Height: 5' (1.524 m)    Body mass index is 28.71 kg/m.  Physical Exam Vitals and nursing note reviewed.  Constitutional:      General: She is not in acute distress.    Appearance: Normal appearance. She is well-developed. She is not ill-appearing, toxic-appearing or diaphoretic.  HENT:     Head: Normocephalic and atraumatic.     Nose: Nose normal.  Eyes:     General:        Right eye: No discharge.        Left eye: No discharge.     Conjunctiva/sclera: Conjunctivae normal.  Neck:     Trachea: No tracheal deviation.  Cardiovascular:     Rate and Rhythm: Normal rate and regular rhythm.  Pulmonary:     Effort: Pulmonary effort is normal. No respiratory distress.     Breath sounds: No stridor.  Musculoskeletal:        General: Normal range of motion.  Skin:    General: Skin is warm and dry.     Findings: No rash.  Neurological:     Mental Status: She is alert.     Motor: No abnormal muscle tone.     Coordination: Coordination normal.  Psychiatric:        Behavior: Behavior normal.      Results for orders placed or performed during the hospital encounter of 09/30/22  Surgical pathology  Result Value Ref Range   SURGICAL PATHOLOGY      SURGICAL PATHOLOGY CASE: [id number] PATIENT: Carolyn Campbell Surgical Pathology Report     Specimen Submitted: A. Colon, right; cbx B. Colon, left; cbx C. Rectum; cbx  Clinical History: Crohn's disease of large intestine without complication HCC [id number].      DIAGNOSIS: A.  COLON, RIGHT; COLD BIOPSY: - MILD CHRONIC  INACTIVE COLITIS CONSISTENT WITH PATIENT'S KNOWN HISTORY OF CROHN'S DISEASE. - NEGATIVE FOR GRANULOMA, DYSPLASIA AND MALIGNANCY.  B.  COLON, LEFT; COLD BIOPSY: - MILD CHRONIC INACTIVE COLITIS, CONSISTENT WITH PATIENT'S KNOWN HISTORY OF CROHN'S DISEASE. - NEGATIVE FOR GRANULOMA, DYSPLASIA OR MALIGNANCY.  C RECTUM; COLD BIOPSY: - CHRONIC, MODERATE TO MARKEDLY ACTIVE COLITIS WITH ULCERATION AND CRYPTITIS, CONSISTENT WITH PATIENT'S KNOWN HISTORY OF CROHN'S DISEASE. - NEGATIVE FOR GRANULOMA, DYSPLASIA AND MALIGNANCY.  GROSS DESCRIPTION: A. Labeled: Cbx right colon for Crohn's colitis Received: Formalin Collection time: 10:45 AM on 09/30/2022  Placed into formalin time: 10:45 AM on 09/30/2022 Tissue fragment(s): Multiple Size: Aggregate, 1.0 x 0.3 x 0.1 cm Description: Tan soft tissue fragments Entirely submitted in 1 cassette.  B. Labeled: Cbx left colon for Crohn's colitis Received: Formalin Collection time: 10:48 AM on 09/30/2022 Placed into formalin time: 10:48 AM on 09/30/2022 Tissue fragment(s): Multiple Size: Aggregate, 1.2 x 0.2 x 0.1 cm Description: Tan soft tissue fragments Entirely submitted in 1 cassette.  C. Labeled: Cbx rectum for Crohn's colitis Received: Formalin Collection time: 10:52 AM on 09/30/2022 Placed into formalin time: 10:52 AM on 09/30/2022 Tissue fragment(s): 3 Size: Aggregate, 0.5 x 0.4 x 0.1 cm Description: Tan soft tissue fragments Entirely submitted in 1 cassette.  CM 09/30/2022  Final Diagnosis performed by Tomasa Blase, MD.   Electronically signed 10/01/2022 10:49:08AM The electronic signature indicates that the named Attending Pathologist has evaluated the specimen T echnical component performed at [facility], [address] [place], [place] [address] Lab: [phone] Dir: Rush Farmer, MD, MMM  Professional component performed at [facility], [facility], [place], [place], [place] [address] Lab: [phone] Dir: Kathi Simpers, MD        Assessment & Plan:     ICD-10-CM   1. H/O malignant neoplasm of breast  Z85.3 For home use only DME Other see comment    2. S/P lumpectomy, left breast  Z98.890 For home use only DME Other see comment    3. Postoperative breast asymmetry  N64.89 For home use only DME Other see comment     She will take orders to [facility] and we will work with them to see if her med hx gets supplies covered Assymetry is worse over the past couple years - esp with weight changes      Carolyn Grana, PA-C 11/15/22 11:04 AM

## 2022-11-22 ENCOUNTER — Ambulatory Visit
Admission: RE | Admit: 2022-11-22 | Discharge: 2022-11-22 | Disposition: A | Discharge status: Home / Self Care | Payer: Medicare Other | Source: Ambulatory Visit | Attending: Gastroenterology | Admitting: Gastroenterology

## 2022-11-22 ENCOUNTER — Ambulatory Visit: Payer: Medicare Other | Admitting: Internal Medicine

## 2022-11-22 ENCOUNTER — Ambulatory Visit: Payer: Medicare Other | Admitting: Nurse Practitioner

## 2022-11-22 ENCOUNTER — Other Ambulatory Visit: Payer: Self-pay | Admitting: *Deleted

## 2022-11-22 ENCOUNTER — Other Ambulatory Visit: Payer: Medicare Other

## 2022-11-22 DIAGNOSIS — K5090 Crohn's disease, unspecified, without complications: Secondary | ICD-10-CM | POA: Insufficient documentation

## 2022-11-22 DIAGNOSIS — Z7901 Long term (current) use of anticoagulants: Secondary | ICD-10-CM

## 2022-11-22 MED ORDER — VEDOLIZUMAB 300 MG IV SOLR
300.0000 mg | Freq: Once | INTRAVENOUS | Status: AC
  Administered 2022-11-22: 300 mg via INTRAVENOUS
  Filled 2022-11-22: qty 5

## 2022-11-23 ENCOUNTER — Inpatient Hospital Stay: Payer: Medicare Other | Attending: Internal Medicine | Admitting: Internal Medicine

## 2022-11-23 ENCOUNTER — Encounter: Payer: Self-pay | Admitting: Internal Medicine

## 2022-11-23 ENCOUNTER — Inpatient Hospital Stay: Payer: Medicare Other

## 2022-11-23 VITALS — BP 119/58 | HR 83 | Temp 98.7°F | Resp 16 | Wt 146.0 lb

## 2022-11-23 DIAGNOSIS — E785 Hyperlipidemia, unspecified: Secondary | ICD-10-CM | POA: Diagnosis not present

## 2022-11-23 DIAGNOSIS — E876 Hypokalemia: Secondary | ICD-10-CM | POA: Diagnosis not present

## 2022-11-23 DIAGNOSIS — Z7901 Long term (current) use of anticoagulants: Secondary | ICD-10-CM

## 2022-11-23 DIAGNOSIS — Z853 Personal history of malignant neoplasm of breast: Secondary | ICD-10-CM | POA: Diagnosis not present

## 2022-11-23 DIAGNOSIS — Z923 Personal history of irradiation: Secondary | ICD-10-CM | POA: Diagnosis not present

## 2022-11-23 DIAGNOSIS — Z5181 Encounter for therapeutic drug level monitoring: Secondary | ICD-10-CM

## 2022-11-23 DIAGNOSIS — I10 Essential (primary) hypertension: Secondary | ICD-10-CM | POA: Insufficient documentation

## 2022-11-23 DIAGNOSIS — E039 Hypothyroidism, unspecified: Secondary | ICD-10-CM | POA: Diagnosis not present

## 2022-11-23 DIAGNOSIS — I2510 Atherosclerotic heart disease of native coronary artery without angina pectoris: Secondary | ICD-10-CM | POA: Insufficient documentation

## 2022-11-23 DIAGNOSIS — K5090 Crohn's disease, unspecified, without complications: Secondary | ICD-10-CM | POA: Insufficient documentation

## 2022-11-23 DIAGNOSIS — I2699 Other pulmonary embolism without acute cor pulmonale: Secondary | ICD-10-CM | POA: Diagnosis not present

## 2022-11-23 DIAGNOSIS — Z8673 Personal history of transient ischemic attack (TIA), and cerebral infarction without residual deficits: Secondary | ICD-10-CM | POA: Diagnosis not present

## 2022-11-23 DIAGNOSIS — J4489 Other specified chronic obstructive pulmonary disease: Secondary | ICD-10-CM | POA: Diagnosis not present

## 2022-11-23 LAB — CBC WITH DIFFERENTIAL/PLATELET
Abs Immature Granulocytes: 0.02 10*3/uL (ref 0.00–0.07)
Basophils Absolute: 0.1 10*3/uL (ref 0.0–0.1)
Basophils Relative: 1 %
Eosinophils Absolute: 0.2 10*3/uL (ref 0.0–0.5)
Eosinophils Relative: 4 %
HCT: 37.2 % (ref 36.0–46.0)
Hemoglobin: 11.8 g/dL — ABNORMAL LOW (ref 12.0–15.0)
Immature Granulocytes: 0 %
Lymphocytes Relative: 32 %
Lymphs Abs: 1.8 10*3/uL (ref 0.7–4.0)
MCH: 28.9 pg (ref 26.0–34.0)
MCHC: 31.7 g/dL (ref 30.0–36.0)
MCV: 91 fL (ref 80.0–100.0)
Monocytes Absolute: 0.4 10*3/uL (ref 0.1–1.0)
Monocytes Relative: 8 %
Neutro Abs: 3 10*3/uL (ref 1.7–7.7)
Neutrophils Relative %: 55 %
Platelets: 220 10*3/uL (ref 150–400)
RBC: 4.09 MIL/uL (ref 3.87–5.11)
RDW: 13.4 % (ref 11.5–15.5)
WBC: 5.5 10*3/uL (ref 4.0–10.5)
nRBC: 0 % (ref 0.0–0.2)

## 2022-11-23 LAB — CMP (CANCER CENTER ONLY)
ALT: 21 U/L (ref 0–44)
AST: 28 U/L (ref 15–41)
Albumin: 3.9 g/dL (ref 3.5–5.0)
Alkaline Phosphatase: 62 U/L (ref 38–126)
Anion gap: 9 (ref 5–15)
BUN: 12 mg/dL (ref 8–23)
CO2: 28 mmol/L (ref 22–32)
Calcium: 9.7 mg/dL (ref 8.9–10.3)
Chloride: 105 mmol/L (ref 98–111)
Creatinine: 0.88 mg/dL (ref 0.44–1.00)
GFR, Estimated: >60 mL/min (ref >60)
Glucose, Bld: 138 mg/dL — ABNORMAL HIGH (ref 70–99)
Potassium: 3.2 mmol/L — ABNORMAL LOW (ref 3.5–5.1)
Sodium: 142 mmol/L (ref 135–145)
Total Bilirubin: 0.7 mg/dL (ref 0.3–1.2)
Total Protein: 7.1 g/dL (ref 6.5–8.1)

## 2022-11-23 MED ORDER — APIXABAN 5 MG PO TABS: 5.0000 mg | ORAL_TABLET | Freq: Two times a day (BID) | ORAL | 5 refills | Status: DC

## 2022-11-23 NOTE — Progress Notes (Signed)
[place]  [phone] [phone]) [phone]  ID: Carolyn Campbell OB: 1955/12/23  MR#: [id number]  [id number]  Patient Care Team: Delsa Grana, PA-C as PCP - General (Family Medicine) Rockey Situ Kathlene November, MD as PCP - Cardiology (Cardiology) Lin Landsman, MD as Consulting Physician (Gastroenterology) Idelle Leech, OD as Consulting Physician (Optometry) Beverly Gust, MD as Consulting Physician (Otolaryngology)  REFERRING PROVIDER: Delsa Grana  REASON FOR REFERRAL: unprovoked acute PE  HPI: Carolyn Campbell is a 67 y.o. female with past medical history of COPD, COVID, Crohn's, hyperlipidemia, hypertension, hypothyroidism, CAD, stroke, depression was referred to hematology with history of recurrent PE.  Patient was admitted in October 2023 for shortness of breath.  CTA chest from 07/19/2022 showed small clot in the lower lobe branch of right pulmonary artery with mild right heart strain. At that time she also had episode of catatonia and loss of consciousness.  She was evaluated by neurology and psychiatry with EEG.  There was concern for behavioral disorder. Patient denies any history of recent surgery, long distance flights or OCP use.  Patient had PE in August 2022 which was attributed to her recent COVID infection.  She was anticoagulated for 3 months.  She is currently on Eliquis and tolerating well.  Interval history Patient was seen today as follow-up for long-term anticoagulation. Patient had colonoscopy with Dr. Marius Ditch due to rectal bleeding.  She has history of Crohn's.  Biopsy showed colitis but no dysplasia or malignancy.  She completed course of prednisone which has really helped her.  She continues to take Eliquis twice a day and is tolerating.  REVIEW OF SYSTEMS:   ROS  As per HPI. Otherwise, a complete review of systems is negative.  PAST MEDICAL HISTORY: Past Medical History:  Diagnosis Date   Abdominal pain, epigastric     Acute metabolic encephalopathy [id number]   Allergic rhinitis    Arthritis    Asthma    Breast cancer ([place]) 2006   LT LUMPECTOMY   Clotting disorder ([place])    Cognitive deficits as late effect of cerebrovascular disease    Convulsions, unspecified convulsion type ([place]) 09/24/2022   COPD (chronic obstructive pulmonary disease) ([place])    COVID-19 virus infection 04/2021   Crohn's disease ([place]) 05/21/2015   FOLLOWED BY GI   Crohn's disease of both small and large intestine with rectal bleeding ([place]) 12/04/2014   Depression    Currently taking zoloft.   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 05/2021 Echo: EF 60-65%, no rwma, nl RV fxn.   History of kidney stones    Hypercholesterolemia 01/18/2007   Hyperlipidemia    Hypertension    Hypothyroidism    Low back pain radiating to left leg 06/17/2021   Nonobstructive CAD (coronary artery disease)    a. 10/2012 Cath: diffuse minor irregs-->med rx; b. 08/2021 Cor CTA: Ca2+ = 55.6 (77th%'ile), LAD 25p, LCX <25p. No signif non-cardiac findings.   Occlusion, cerebral artery    NOS w/infarction   Osteoporosis    Overweight (BMI 25.0-29.9) 07/17/2022   Personal history of radiation therapy 2006   BREAST CA   Pulmonary embolism ([place])    a.04/2021 CTA Chest: Small filling defects are noted in upper and  lower lobe branches of L PA-->acute PE-->eliquis. *PE occurred in context of COVID infxn.   Stroke [facility])    Ulcer     PAST SURGICAL HISTORY: Past Surgical History:  Procedure Laterality Date   BREAST BIOPSY Left 2006   POS   BREAST LUMPECTOMY Left 09/20/2004   positive   BREAST SURGERY Left    malignant biopsy   CARDIAC CATHETERIZATION     COLONOSCOPY  2015   COLONOSCOPY WITH PROPOFOL N/A 06/08/2017    Procedure: COLONOSCOPY WITH PROPOFOL;  Surgeon: Lin Landsman, MD;  Location: [facility];  Service: Gastroenterology;  Laterality: N/A;   COLONOSCOPY WITH PROPOFOL N/A 03/08/2019   Procedure: COLONOSCOPY WITH PROPOFOL;  Surgeon: Lin Landsman, MD;  Location: [facility] ENDOSCOPY;  Service: Gastroenterology;  Laterality: N/A;   COLONOSCOPY WITH PROPOFOL N/A 07/16/2020   Procedure: COLONOSCOPY WITH PROPOFOL;  Surgeon: Lin Landsman, MD;  Location[facility] ENDOSCOPY;  Service: Gastroenterology;  Laterality: N/A;   COLONOSCOPY WITH PROPOFOL N/A 09/30/2022   Procedure: COLONOSCOPY WITH PROPOFOL;  Surgeon: Lin Landsman, MD;  Location: [facility] ENDOSCOPY;  Service: Gastroenterology;  Laterality: N/A;   ESOPHAGOGASTRODUODENOSCOPY  2015   ESOPHAGOGASTRODUODENOSCOPY (EGD) WITH PROPOFOL N/A 06/08/2017   Procedure: ESOPHAGOGASTRODUODENOSCOPY (EGD) WITH PROPOFOL;  Surgeon: Lin Landsman, MD;  Location: [facility];  Service: Gastroenterology;  Laterality: N/A;   ESOPHAGOGASTRODUODENOSCOPY (EGD) WITH PROPOFOL N/A 03/08/2019   Procedure: ESOPHAGOGASTRODUODENOSCOPY (EGD) WITH PROPOFOL;  Surgeon: Lin Landsman, MD;  Location: [facility] ENDOSCOPY;  Service: Gastroenterology;  Laterality: N/A;   FINGER SURGERY     Right small finger   FRACTURE SURGERY     GIVENS CAPSULE STUDY  2015   TUBAL LIGATION      FAMILY HISTORY: Family History  Problem Relation Age of Onset   Heart disease Mother    Heart attack Mother    Cerebrovascular Accident Father    Arthritis Father    Hypertension Father    Dementia Father    Breast cancer Sister    Breast cancer Sister 82   Cancer Sister    Hypertension Other    Diabetes Other    Heart attack Other 34   Mental illness Neg Hx     HEALTH MAINTENANCE: Social History   Tobacco Use   Smoking status: Never   Smokeless tobacco: Never  Vaping Use   Vaping Use: Never used  Substance Use Topics   Alcohol use: No    Alcohol/week:  0.0 standard drinks of alcohol   Drug use: No     No Known Allergies  Current Outpatient Medications  Medication Sig Dispense Refill   brimonidine (ALPHAGAN) 0.2 % ophthalmic solution Place 1 drop into both eyes in the morning and at bedtime.     latanoprost (XALATAN) 0.005 % ophthalmic solution Place 1 drop into both eyes at bedtime.      levothyroxine (SYNTHROID) 75 MCG tablet Take 1 tablet (75 mcg total) by mouth daily before breakfast. 90 tablet 0   losartan-hydrochlorothiazide (HYZAAR) 50-12.5 MG tablet Take 1 tablet by mouth daily. 90 tablet 2   Multiple Vitamin (MULTIVITAMIN) tablet Take 1 tablet by mouth daily.     rosuvastatin (CRESTOR) 40 MG tablet Take 1 tablet (40 mg total) by mouth daily. 90 tablet 0   sertraline (ZOLOFT) 50 MG tablet Take 1 tablet (50 mg total) by mouth daily. 30 tablet 2   vedolizumab (ENTYVIO) 300 MG injection Induction Entyvio '300mg'$  at week 0,2, and 6 and then maintenance does every 8 weeks 1  each 12   apixaban (ELIQUIS) 5 MG TABS tablet Take 1 tablet (5 mg total) by mouth 2 (two) times daily. 180 tablet 5   No current facility-administered medications for this visit.    OBJECTIVE: Vitals:   11/23/22 1125  BP: (!) 119/58  Pulse: 83  Resp: 16  Temp: 98.7 F (37.1 C)  SpO2: 96%     Body mass index is 28.51 kg/m.      General: Well-developed, well-nourished, no acute distress. Eyes: Pink conjunctiva, anicteric sclera. HEENT: Normocephalic, moist mucous membranes, clear oropharnyx. Lungs: Clear to auscultation bilaterally. Heart: Regular rate and rhythm. No rubs, murmurs, or gallops. Abdomen: Soft, nontender, nondistended. No organomegaly noted, normoactive bowel sounds. Musculoskeletal: No edema, cyanosis, or clubbing. Neuro: Alert, answering all questions appropriately. Cranial nerves grossly intact. Skin: No rashes or petechiae noted. Psych: Normal affect. Lymphatics: No cervical, calvicular, axillary or inguinal LAD.   LAB  RESULTS:  Lab Results  Component Value Date   NA 142 11/23/2022   K 3.2 (L) 11/23/2022   CL 105 11/23/2022   CO2 28 11/23/2022   GLUCOSE 138 (H) 11/23/2022   BUN 12 11/23/2022   CREATININE 0.88 11/23/2022   CALCIUM 9.7 11/23/2022   PROT 7.1 11/23/2022   ALBUMIN 3.9 11/23/2022   AST 28 11/23/2022   ALT 21 11/23/2022   ALKPHOS 62 11/23/2022   BILITOT 0.7 11/23/2022   GFRNONAA >60 11/23/2022   GFRAA 78 01/29/2021    Lab Results  Component Value Date   WBC 5.5 11/23/2022   NEUTROABS 3.0 11/23/2022   HGB 11.8 (L) 11/23/2022   HCT 37.2 11/23/2022   MCV 91.0 11/23/2022   PLT 220 11/23/2022    Lab Results  Component Value Date   TIBC 368 09/08/2022   TIBC 330 10/17/2019   TIBC 378 01/17/2018   FERRITIN 56 09/08/2022   FERRITIN 66 04/23/2022   FERRITIN 76 10/17/2019   IRONPCTSAT 16 09/08/2022   IRONPCTSAT 26 10/17/2019   IRONPCTSAT 14 01/17/2018     STUDIES: No results found.  ASSESSMENT AND PLAN:   Carolyn Campbell is a 67 y.o. female with pmh of of COPD, COVID, Crohn's, hyperlipidemia, hypertension, hypothyroidism, CAD, stroke, depression was referred to hematology with history of recurrent PE.  #History of PE in August 2022 presumably from [place] #Acute PE, unprovoked in October 2023 -Hypercoagulable workup was negative.  -Due to history of 2 episodes of blood clot, patient was recommended lifelong anticoagulation.  She is on Eliquis 5 mg twice daily.  Tolerating well.  Denies any bleeding.  Discussed about monitoring stools and urine and if there is any bleeding she should reach out to me.  If there is any fall and if she hits her head she needs to go to emergency room.  Refills given.  Patient was inquiring if she can have an eye procedure for eyelid drooping.  Advised that Eliquis should be held for 2 days prior to the surgery.  # Crohn's disease -Follows with Dr. Marius Ditch.  Had colonoscopy in February 2024 which showed colitis.  No evidence of dysplasia or  malignancy.  She completed a round of prednisone which has helped with her symptoms.  Rectal bleeding has resolved.  # Mild hypokalemia -Declined Kdur.  Patient does not want to take more medications.  She would like to discuss further with her primary next month. -Encouraged her to eat food rich in potassium and patient information was provided.  Orders Placed This Encounter  Procedures   CBC with Differential/Platelet  Comprehensive metabolic panel   RTC in 6 months for MD visit, labs  Patient expressed understanding and was in agreement with this plan. She also understands that She can call clinic at any time with any questions, concerns, or complaints.   I spent a total of 30 minutes reviewing chart data, face-to-face evaluation with the patient, counseling and coordination of care as detailed above.  Carolyn Canary, MD   11/23/2022 12:32 PM

## 2022-11-23 NOTE — Progress Notes (Signed)
Pt in for follow up, reports has a disc bothering her and causes her to have upper left leg pain.  Pt reports she gets injections for it and denies any other difficulties.

## 2022-11-23 NOTE — Patient Instructions (Signed)
Potassium Content of Foods  Potassium is a mineral found in many foods and drinks. It can affect how the heart works, affect blood pressure, and keep fluids and electrolytes balanced in the body. It is important not to have too much potassium (hyperkalemia) or too little potassium (hypokalemia) in the body, especially in the blood. Potassium is naturally found in many different types of whole foods, such as fruits, vegetables, meat, and dairy products. Processed foods tend to be lower in potassium. The amount of potassium you need each day depends on your age and any medical conditions you may have. General recommendations are: Females aged 45 and older: 2,600 mg per day. Males aged 70 and older: 3,400 mg per day. Talk with your health care provider or dietitian about how much potassium you need. What foods are high in potassium? Below are examples of foods that have greater than 200 mg of potassium per serving. Fruits Orange -- 1 medium (130 g) has 230 mg of potassium. Banana -- 1 medium (120 g) has 420 mg of potassium. Cantaloupe, chunks -- 1 cup (160 g) has 430 mg of potassium. Vegetables Potato, baked, without skin -- 1 medium (170 g) has 600 mg of potassium. Broccoli, chopped, cooked --  cup (77.5 g) has 230 mg of potassium. Tomato, chopped or sliced -- 1 cup (0000000 g) has 400 mg of potassium. Grains Cereal, bran with raisins -- 1 cup (59 g) has 360 mg of potassium. Granola with almonds --  cup (82 g) has 220 mg of potassium. Meats and other proteins Ground beef patty -- 4 ounces (113 g) has 240 mg of potassium. Kidney beans, boiled --  cup (130 g) has 350 mg of potassium. Almonds -- 1 ounce (approximately 22 nuts or 28 g) has 200 mg of potassium. Dairy Cow's milk, 1% -- 1 cup (237 mL) has 360 mg of potassium. Plain vanilla low-fat yogurt --  cup (184 g) has 220 mg of potassium. The items listed above may not be a complete list of foods high in potassium. Actual amounts of potassium  may be different depending on ripeness, shelf life, and food preparation. Contact a dietitian for more information. What foods are low in potassium? Below are examples of foods that have less than 200 mg of potassium per serving. Fruits Blueberries -- 1 cup (145 g) has 110 mg of potassium. Apple -- 1 medium (140 g) has 145 mg of potassium. Grapes -- 1 cup (160 g) has 175 mg of potassium. Vegetables Cabbage, raw -- 1 cup (70 g) has 120 mg of potassium. Cauliflower, chopped, cooked -- 1 cup (180 g) has 90 mg of potassium. Romaine lettuce, chopped -- 1 cup (56 g) has 120 mg of potassium. Grains Bagel, plain -- one 4-inch (10 cm) has 100 mg of potassium. Whole wheat bread -- 1 slice (26 g) has 70 mg of potassium. White rice, cooked -- 1 cup (163 g) has 50 mg of potassium. Meats and other proteins Tuna, light, canned in water -- 3 ounces (85 g) has 150 mg of potassium. Egg, fried -- 1 large (50 g) has 60 mg of potassium. Peanuts --1 ounce (35 nuts or 28 g) has 180 mg of potassium. Tofu --  cup (252 g) has 150 mg of potassium. Dairy Cheese (cheddar, colby, mozzarella, or provolone) -- 1 ounce (28 g) has 30 to 40 mg of potassium. The items listed above may not be a complete list of foods that are low in potassium. Actual amounts of  potassium may be different depending on ripeness, shelf life, and food preparation. Contact a dietitian for more information. Summary Potassium is a mineral found in many foods and drinks. It affects how the heart works, affects blood pressure, and keeps fluids and electrolytes balanced in the body. The amount of potassium you need each day depends on your age and any existing medical conditions you may have. Your health care provider or dietitian may recommend an amount of potassium that you should have each day. This information is not intended to replace advice given to you by your health care provider. Make sure you discuss any questions you have with your health  care provider. Document Revised: 06/09/2021 Document Reviewed: 05/21/2021 Elsevier Patient Education  [place].

## 2022-11-27 ENCOUNTER — Encounter: Payer: Self-pay | Admitting: Internal Medicine

## 2022-11-29 ENCOUNTER — Other Ambulatory Visit: Payer: Self-pay | Admitting: *Deleted

## 2022-11-29 DIAGNOSIS — G4733 Obstructive sleep apnea (adult) (pediatric): Secondary | ICD-10-CM | POA: Diagnosis not present

## 2022-11-29 DIAGNOSIS — I69359 Hemiplegia and hemiparesis following cerebral infarction affecting unspecified side: Secondary | ICD-10-CM | POA: Diagnosis not present

## 2022-11-29 DIAGNOSIS — F32A Depression, unspecified: Secondary | ICD-10-CM | POA: Diagnosis not present

## 2022-11-29 DIAGNOSIS — G4752 REM sleep behavior disorder: Secondary | ICD-10-CM | POA: Diagnosis not present

## 2022-11-29 DIAGNOSIS — R2689 Other abnormalities of gait and mobility: Secondary | ICD-10-CM | POA: Diagnosis not present

## 2022-11-29 MED ORDER — APIXABAN 5 MG PO TABS: 5.0000 mg | ORAL_TABLET | Freq: Two times a day (BID) | ORAL | 0 refills | Status: DC

## 2022-12-03 DIAGNOSIS — M533 Sacrococcygeal disorders, not elsewhere classified: Secondary | ICD-10-CM | POA: Diagnosis not present

## 2022-12-04 ENCOUNTER — Other Ambulatory Visit: Payer: Self-pay | Admitting: Family Medicine

## 2022-12-06 ENCOUNTER — Ambulatory Visit: Payer: Medicare Other

## 2022-12-06 DIAGNOSIS — H26493 Other secondary cataract, bilateral: Secondary | ICD-10-CM | POA: Diagnosis not present

## 2022-12-06 DIAGNOSIS — H401111 Primary open-angle glaucoma, right eye, mild stage: Secondary | ICD-10-CM | POA: Diagnosis not present

## 2022-12-06 DIAGNOSIS — H26491 Other secondary cataract, right eye: Secondary | ICD-10-CM | POA: Diagnosis not present

## 2022-12-06 MED ORDER — ROSUVASTATIN CALCIUM 40 MG PO TABS: 40.0000 mg | ORAL_TABLET | Freq: Every day | ORAL | 0 refills | Status: DC

## 2022-12-07 ENCOUNTER — Encounter: Payer: Self-pay | Admitting: Family Medicine

## 2022-12-08 DIAGNOSIS — Z9012 Acquired absence of left breast and nipple: Secondary | ICD-10-CM | POA: Diagnosis not present

## 2022-12-08 DIAGNOSIS — Z4432 Encounter for fitting and adjustment of external left breast prosthesis: Secondary | ICD-10-CM | POA: Diagnosis not present

## 2022-12-08 DIAGNOSIS — C50112 Malignant neoplasm of central portion of left female breast: Secondary | ICD-10-CM | POA: Diagnosis not present

## 2022-12-10 ENCOUNTER — Ambulatory Visit (INDEPENDENT_AMBULATORY_CARE_PROVIDER_SITE_OTHER): Payer: Medicare Other | Admitting: Family Medicine

## 2022-12-10 ENCOUNTER — Encounter: Payer: Self-pay | Admitting: Family Medicine

## 2022-12-10 VITALS — BP 132/70 | HR 82 | Temp 97.9°F | Resp 16 | Ht 60.0 in | Wt 143.6 lb

## 2022-12-10 DIAGNOSIS — Z889 Allergy status to unspecified drugs, medicaments and biological substances status: Secondary | ICD-10-CM | POA: Diagnosis not present

## 2022-12-10 DIAGNOSIS — E876 Hypokalemia: Secondary | ICD-10-CM

## 2022-12-10 DIAGNOSIS — Z8709 Personal history of other diseases of the respiratory system: Secondary | ICD-10-CM | POA: Diagnosis not present

## 2022-12-10 DIAGNOSIS — J329 Chronic sinusitis, unspecified: Secondary | ICD-10-CM

## 2022-12-10 DIAGNOSIS — R051 Acute cough: Secondary | ICD-10-CM | POA: Diagnosis not present

## 2022-12-10 DIAGNOSIS — E039 Hypothyroidism, unspecified: Secondary | ICD-10-CM

## 2022-12-10 MED ORDER — LORATADINE 10 MG PO TABS: 10.0000 mg | ORAL_TABLET | Freq: Every day | ORAL | 11 refills | Status: DC

## 2022-12-10 MED ORDER — ALBUTEROL SULFATE HFA 108 (90 BASE) MCG/ACT IN AERS: 2.0000 | INHALATION_SPRAY | Freq: Four times a day (QID) | RESPIRATORY_TRACT | 2 refills | Status: DC | PRN

## 2022-12-10 MED ORDER — MOMETASONE FUROATE 50 MCG/ACT NA SUSP: 2.0000 | Freq: Every day | NASAL | 12 refills | Status: DC

## 2022-12-10 NOTE — Patient Instructions (Signed)
Allergic Rhinitis, Adult  Allergic rhinitis is a reaction to allergens. Allergens are things that can cause an allergic reaction. This condition affects the lining inside the nose (mucous membrane). There are two types of allergic rhinitis: Seasonal. This type is also called hay fever. It happens only during some times of the year. Perennial. This type can happen at any time of the year. This condition cannot be spread from person to person (is not contagious). It can be mild, bad, or very bad. It can develop at any age and may be outgrown. What are the causes? Pollen from grasses, trees, and weeds. Other causes can be: Dust mites. Smoke. Mold. Car fumes. The pee (urine), spit, or dander of pets. Dander is dead skin cells from a pet. What increases the risk? You are more likely to develop this condition if: You have allergies in your family. You have problems like allergies in your family. You may have: Swelling of parts of your eyes and eyelids. Asthma. This affects how you breathe. Long-term redness and swelling on your skin. Food allergies. What are the signs or symptoms? The main symptom of this condition is a runny or stuffy nose (nasal congestion). Other symptoms may include: Sneezing or coughing. Itching and tearing of your eyes. Mucus that drips down the back of your throat (postnasal drip). This may cause a sore throat. Trouble sleeping. Feeling tired. Headache. How is this treated? There is no cure for this condition. You should avoid things that you are allergic to. Treatment can help to relieve symptoms. This may include: Medicines that block allergy symptoms, such as anti-inflammatories or antihistamines. These may be given as a shot, nasal spray, or pill. Avoiding things you are allergic to. Medicines that give you some of what you are allergic to over time. This is called immunotherapy. It is done if other treatments do not help. You may get: Shots. Medicine under  your tongue. Stronger medicines, if other treatments do not help. Follow these instructions at home: Avoiding allergens Find out what things you are allergic to and avoid them. To do this, try these things: If you get allergies any time of year: Replace carpet with wood, tile, or vinyl flooring. Carpet can trap pet dander and dust. Do not smoke. Do not allow smoking in your home. Change your heating and air conditioning filters at least once a month. If you get allergies only some times of the year: Keep windows closed when you can. Plan things to do outside when pollen counts are lowest. Check pollen counts before you plan things to do outside. When you come indoors, change your clothes and shower before you sit on furniture or bedding. If you are allergic to a pet: Keep the pet out of your bedroom. Vacuum, sweep, and dust often. General instructions Take over-the-counter and prescription medicines only as told by your doctor. Drink enough fluid to keep your pee pale yellow. Where to find more information American Academy of Allergy, Asthma & Immunology: aaaai.org Contact a doctor if: You have a fever. You get a cough that does not go away. You make high-pitched whistling sounds when you breathe, most often when you breathe out (wheeze). Your symptoms slow you down. Your symptoms stop you from doing your normal things each day. Get help right away if: You are short of breath. This symptom may be an emergency. Get help right away. Call 911. Do not wait to see if the symptom will go away. Do not drive yourself to the   hospital. This information is not intended to replace advice given to you by your health care provider. Make sure you discuss any questions you have with your health care provider. Document Revised: 05/17/2022 Document Reviewed: 05/17/2022 Elsevier Patient Education  2023 [employer] Inc.  

## 2022-12-10 NOTE — Progress Notes (Signed)
Patient ID: Carolyn Campbell, female    DOB: [birth year], 67 y.o.   MRN: [id number]  PCP: Delsa Grana, PA-C  Chief Complaint  Patient presents with   Nasal Congestion    Onset for 2 weeks   Cough    Subjective:   Carolyn Campbell is a 67 y.o. female, presents to clinic with CC of the following:  HPI  Patient presents with nasal drainage discharge and congestion for about 2 weeks with some dry cough and chest tightness.  Last week she took COVID test x 2 which was negative.  She has tried Tylenol severe cold and sinus without any improvement to her symptoms.  She is starting to sound more congested with more nasal phonation and scratchy voice. She reports feeling a little tight in her chest but she has no pain with breathing, cough is not productive, she denies any shortness of breath.  She has a history of asthma but has not needed an inhaler for about 5 years.  She has no known sick contacts She further denies any nausea vomiting diarrhea, fever sweats chills body aches  Last around this time she had similar symptoms.  She does have a history of seasonal allergies but is not currently taking the medication that she was given last year nor taking any other over-the-counter medication  Patient Active Problem List   Diagnosis Date Noted   Crohn's disease of large intestine without complication ([place]) [id number]   Convulsions, unspecified convulsion type ([place]) 09/24/2022   Encounter for current long-term use of anticoagulants 09/24/2022   MDD (major depressive disorder), recurrent episode, mild ([place]) 08/18/2022   Restless leg syndrome 08/18/2022   Pulmonary embolism ([place]) 07/16/2022   Pain in limb 11/10/2021   Nonobstructive CAD (coronary artery disease) 08/06/2021   Other long term (current) drug therapy 01/22/2021   Varicose veins of both lower extremities with pain 07/19/2018   Arthritis of right hand 12/21/2017   Major depression in remission ([place]) 12/21/2017   Fibrocystic breast  changes 06/22/2017   Osteopenia 10/18/2016   Atherosclerosis of aorta ([place]) 12/19/2015   Vitreous degeneration 05/21/2015   Glaucoma associated with chamber angle anomalies 05/21/2015   H/O malignant neoplasm of breast 05/21/2015   Crohn's disease of both small and large intestine with rectal bleeding ([place]) 12/04/2014   Solitary pulmonary nodule 11/07/2012   Cognitive deficits as late effect of cerebrovascular disease 01/20/2010   CVA, old, hemiparesis ([place]) 04/28/2009   Acquired hypothyroidism 06/12/2008   HLD (hyperlipidemia) 01/18/2007   Primary hypertension 01/18/2007      Current Outpatient Medications:    albuterol (VENTOLIN HFA) 108 (90 Base) MCG/ACT inhaler, Inhale 2 puffs into the lungs every 6 (six) hours as needed for wheezing or shortness of breath (chest tightness)., Disp: 8 g, Rfl: 2   apixaban (ELIQUIS) 5 MG TABS tablet, Take 1 tablet (5 mg total) by mouth 2 (two) times daily., Disp: 60 tablet, Rfl: 0   brimonidine (ALPHAGAN) 0.2 % ophthalmic solution, Place 1 drop into both eyes in the morning and at bedtime., Disp: , Rfl:    latanoprost (XALATAN) 0.005 % ophthalmic solution, Place 1 drop into both eyes at bedtime. , Disp: , Rfl:    levothyroxine (SYNTHROID) 75 MCG tablet, Take 1 tablet (75 mcg total) by mouth daily before breakfast., Disp: 90 tablet, Rfl: 0   losartan-hydrochlorothiazide (HYZAAR) 50-12.5 MG tablet, Take 1 tablet by mouth daily., Disp: 90 tablet, Rfl: 2   mometasone (NASONEX) 50 MCG/ACT nasal spray, Place 2  sprays into the nose daily., Disp: 1 each, Rfl: 12   Multiple Vitamin (MULTIVITAMIN) tablet, Take 1 tablet by mouth daily., Disp: , Rfl:    rosuvastatin (CRESTOR) 40 MG tablet, Take 1 tablet (40 mg total) by mouth daily., Disp: 90 tablet, Rfl: 0   vedolizumab (ENTYVIO) 300 MG injection, Induction Entyvio 300mg  at week 0,2, and 6 and then maintenance does every 8 weeks, Disp: 1 each, Rfl: 12   loratadine (CLARITIN) 10 MG tablet, Take 1 tablet (10 mg  total) by mouth daily., Disp: 30 tablet, Rfl: 11   sertraline (ZOLOFT) 50 MG tablet, Take 1 tablet (50 mg total) by mouth daily., Disp: 30 tablet, Rfl: 2   No Known Allergies   Social History   Tobacco Use   Smoking status: Never   Smokeless tobacco: Never  Vaping Use   Vaping Use: Never used  Substance Use Topics   Alcohol use: No    Alcohol/week: 0.0 standard drinks of alcohol   Drug use: No      Chart Review Today: I personally reviewed active problem list, medication list, allergies, family history, social history, health maintenance, notes from last encounter, lab results, imaging with the patient/caregiver today.   Review of Systems  Constitutional: Negative.   HENT: Negative.    Eyes: Negative.   Respiratory: Negative.    Cardiovascular: Negative.   Gastrointestinal: Negative.   Endocrine: Negative.   Genitourinary: Negative.   Musculoskeletal: Negative.   Skin: Negative.   Allergic/Immunologic: Negative.   Neurological: Negative.   Hematological: Negative.   Psychiatric/Behavioral: Negative.    All other systems reviewed and are negative.      Objective:   Vitals:   12/10/22 1110  BP: 132/70  Pulse: 82  Resp: 16  Temp: 97.9 F (36.6 C)  TempSrc: Oral  SpO2: 97%  Weight: 143 lb 9.6 oz (65.1 kg)  Height: 5' (1.524 m)    Body mass index is 28.04 kg/m.  Physical Exam Vitals and nursing note reviewed.  Constitutional:      General: She is not in acute distress.    Appearance: Normal appearance. She is well-developed and normal weight. She is not ill-appearing, toxic-appearing or diaphoretic.     Comments: Well appearing female, looks younger than stated age, NAD  HENT:     Head: Normocephalic and atraumatic.     Right Ear: Hearing, tympanic membrane, ear canal and external ear normal. There is no impacted cerumen.     Left Ear: Hearing, tympanic membrane, ear canal and external ear normal. There is no impacted cerumen.     Nose: Mucosal edema,  congestion and rhinorrhea (profuse drainage) present. Rhinorrhea is clear.     Right Turbinates: Enlarged and swollen. Not pale (erythematous/inflammaed appearing bilaterally).     Left Turbinates: Enlarged and swollen. Not pale.     Right Sinus: No maxillary sinus tenderness or frontal sinus tenderness.     Left Sinus: No maxillary sinus tenderness or frontal sinus tenderness.     Mouth/Throat:     Lips: Pink.     Mouth: Mucous membranes are moist. Mucous membranes are not pale. No oral lesions.     Tongue: No lesions.     Pharynx: Oropharynx is clear. Uvula midline. Posterior oropharyngeal erythema (mild injection and visible postnasal drip) present. No pharyngeal swelling, oropharyngeal exudate or uvula swelling.     Tonsils: No tonsillar exudate or tonsillar abscesses. 1+ on the right. 1+ on the left.  Eyes:     General: No scleral  icterus.       Right eye: No discharge.        Left eye: No discharge.     Conjunctiva/sclera: Conjunctivae normal.  Neck:     Trachea: Trachea normal. No tracheal deviation.     Comments: Phonation a little scratchy and nasal Cardiovascular:     Rate and Rhythm: Normal rate and regular rhythm.     Pulses: Normal pulses.     Heart sounds: Normal heart sounds.  Pulmonary:     Effort: Pulmonary effort is normal. No tachypnea, accessory muscle usage, prolonged expiration, respiratory distress or retractions.     Breath sounds: Normal breath sounds. No stridor, decreased air movement or transmitted upper airway sounds. No wheezing, rhonchi or rales.     Comments: Able to speak in full and complete sentences Abdominal:     General: Bowel sounds are normal. There is no distension.     Palpations: Abdomen is soft.  Musculoskeletal:        General: Normal range of motion.     Cervical back: Normal range of motion and neck supple.  Lymphadenopathy:     Cervical: No cervical adenopathy.  Skin:    General: Skin is warm and dry.     Coloration: Skin is not  pale.     Findings: No rash.  Neurological:     Mental Status: She is alert.     Motor: No abnormal muscle tone.     Coordination: Coordination normal.  Psychiatric:        Mood and Affect: Mood normal.        Behavior: Behavior normal.      Results for orders placed or performed in visit on 11/23/22  CMP ([place] only)  Result Value Ref Range   Sodium 142 135 - 145 mmol/L   Potassium 3.2 (L) 3.5 - 5.1 mmol/L   Chloride 105 98 - 111 mmol/L   CO2 28 22 - 32 mmol/L   Glucose, Bld 138 (H) 70 - 99 mg/dL   BUN 12 8 - 23 mg/dL   Creatinine 0.88 0.44 - 1.00 mg/dL   Calcium 9.7 8.9 - 10.3 mg/dL   Total Protein 7.1 6.5 - 8.1 g/dL   Albumin 3.9 3.5 - 5.0 g/dL   AST 28 15 - 41 U/L   ALT 21 0 - 44 U/L   Alkaline Phosphatase 62 38 - 126 U/L   Total Bilirubin 0.7 0.3 - 1.2 mg/dL   GFR, Estimated >60 >60 mL/min   Anion gap 9 5 - 15  CBC with Differential/Platelet  Result Value Ref Range   WBC 5.5 4.0 - 10.5 K/uL   RBC 4.09 3.87 - 5.11 MIL/uL   Hemoglobin 11.8 (L) 12.0 - 15.0 g/dL   HCT 37.2 36.0 - 46.0 %   MCV 91.0 80.0 - 100.0 fL   MCH 28.9 26.0 - 34.0 pg   MCHC 31.7 30.0 - 36.0 g/dL   RDW 13.4 11.5 - 15.5 %   Platelets 220 150 - 400 K/uL   nRBC 0.0 0.0 - 0.2 %   Neutrophils Relative % 55 %   Neutro Abs 3.0 1.7 - 7.7 K/uL   Lymphocytes Relative 32 %   Lymphs Abs 1.8 0.7 - 4.0 K/uL   Monocytes Relative 8 %   Monocytes Absolute 0.4 0.1 - 1.0 K/uL   Eosinophils Relative 4 %   Eosinophils Absolute 0.2 0.0 - 0.5 K/uL   Basophils Relative 1 %   Basophils Absolute 0.1 0.0 -  0.1 K/uL   Immature Granulocytes 0 %   Abs Immature Granulocytes 0.02 0.00 - 0.07 K/uL       Assessment & Plan:   Pt presents for about 2 weeks of nasal sx, runny nose, drainage, cough, covid neg x 2, no CP/SOB, fever chills, sweats.    ICD-10-CM   1. Rhinosinusitis  J32.9 mometasone (NASONEX) 50 MCG/ACT nasal spray    loratadine (CLARITIN) 10 MG tablet   edema, erythema congestion, profuse clear  discharge w/o sinus ttp/ha/fever - tx with meds below, similar sx last year spring Enlarged turbinates - hx of allergies and asthma - reviewed sx, timing, hx - explained what meds are safe -  If her insurance does not cover the medications sent in she can use any daily intranasal steroid spray 2 sprays each nostril once daily, I educated her on proper technique and use of medicines, and encouraged her to use daily for at least 2 weeks in order to see the effect -best use of the long-term medicine Also encouraged her to use a once daily second-generation antihistamine like Claritin Zyrtec Allegra or Xyzal or generic equivalent take dosing per bottle or box (usually 1 tablet at bedtime) hopefully she will see some improvement with this    2. Acute cough  R05.1 albuterol (VENTOLIN HFA) 108 (90 Base) MCG/ACT inhaler   hx of childhood asthma, last inhaler was about 5 years ago, she has dry cough with chest tightness, lungs CTA, try rescue inhaler She may also continue to use the over-the-counter medicine she got the main ingredient is Tylenol and Mucinex Hopefully she will see some improvement with decreasing the postnasal drip    3. History of asthma  Z87.09 albuterol (VENTOLIN HFA) 108 (90 Base) MCG/ACT inhaler   refill on albuterol - at this time do not feel like she has asthma exacerbation - no wheeze, coughing fits/bronchospasm, SOB - tried SABA for chest tightness Close follow-up in case any worsening    4. History of seasonal allergies  Z88.9 mometasone (NASONEX) 50 MCG/ACT nasal spray    loratadine (CLARITIN) 10 MG tablet   encouraged her to stay on meds long term and each year consider starting allergy meds daily just prior to spring    5. Acquired hypothyroidism  E03.9 TSH   last TSH low, dose decreased -patient states that she will run out of medications before her next appointment so she asked about this today we will recheck labs today Lab Results  Component Value Date   TSH 0.35 (L)  09/21/2022  Her levothyroxine dose was decreased from 75 mcg daily to 75 mcg 5 days of the week and then a half dose/half tablet (37.5 mcg) 2 days a week She has not noticed any new symptoms or changes in mood energy weight cognition bowels hair or skin She will run out of her medications very soon so refills will be sent in based on her lab results     6. Hypokalemia  [id number] BASIC METABOLIC PANEL WITH GFR   last potassium 3.2 - recheck today while doing TSH lab         Pt has in chart past hx of asthma and COPD, with some chronic dyspnea complaints she did consult with pulmonary in early 2023 - see below - seems unlikely to have COPD - per pulm, she has been on inhalers for years - today with reported chest tightness I did send in refill of albuterol and encouraged her to try and it and see if  it alleviates the tightness - then she can continue use as prescribed - but if not affect then she can d/c albuterol  For cough if it worsens at all she may want to try mucinex or f/up in office - but right now lungs CTA, no wheeze, vss, it may just be some rhinitis with postnasal drip causing cough/throat clearing - visible profuse clean nasal drainage - her cough may improve with managing rhinitis/rhinosinusitis  Review of chart to see asthma/copd hx - Dr. Melvyn Novas A&P from 10/15/2021-  DOE (dyspnea on exertion) January 2014 echocardiogram by Dr. Jerrye Beavers at normal per patient January 2014 CT [facility] chest at [facility] no evidence of pulmonary embolism, 3 mm right upper lobe pulmonary nodule, no lymphadenopathy January 2014 labwork at [facility] includes a pro BNP value of 27, hemoglobin of 12.7, d-dimer of 741 (elevated) calcium of 10.7 bicarbonate 24 (normal) 10/2012 Lexiscan> non-reversible defect ant/inf wall 10/2102 [facility] ([facility]) > Normal LVF, EF 55%; minor irreg CAD; note says R fem vein accessed but no RHC numbers reported 11/07/2012 simple spirometry poor patient effort,  uninterpretable 11/14/2012 unable to complete PFTs, did not follow commands 05/05/2021 L sided segmental upper and lower PE rx eliquis 06/11/21 echo nl R sided pressures    Unable to shed further light on her multiple chest complaints which appear quite chronic and are ripe for misinterpretation and expose this patient to polypharmacy and misdiagnosis (dx of copd for example in chart in a never smoker is not tenable)     I explained as clearly and logically as I could how symptoms are evaluated and now important it is to understand response to Rx as part of the evaluation process rather than just ordering more meds or tests, but she became irritated by the questions and terminated the evaluation before it was complete.    Would consider referring to alternate pulmonary provider or service as both Dr Lake Bells nor I have been able to help her.    As regards to dx /rx of PE, as long as she continues to have symptoms that overlap with recurrent PE, I would favor keeping her on at least a prophylactic dose once it has been established that she has no active clots (baseline D dimer and venous dopplers would do in absence of any evidence of elevated R Ht pressures on echo-  nl in 06/11/21 which was p PE)    F/up if not improving -I did explain concerning symptoms that she should come back for [place] to recheck or send [place] a message or call -she may need to be rechecked if she develops new fever, facial sinus pain or pressure that is severe, worsening cough, pain with breathing, shortness of breath -patient verbalized understanding    Delsa Grana, PA-C 12/10/22 11:36 AM

## 2022-12-11 LAB — BASIC METABOLIC PANEL WITH GFR
BUN: 18 mg/dL (ref 7–25)
CO2: 26 mmol/L (ref 20–32)
Calcium: 10.9 mg/dL — ABNORMAL HIGH (ref 8.6–10.4)
Chloride: 102 mmol/L (ref 98–110)
Creat: 0.72 mg/dL (ref 0.50–1.05)
Glucose, Bld: 86 mg/dL (ref 65–99)
Potassium: 4 mmol/L (ref 3.5–5.3)
Sodium: 141 mmol/L (ref 135–146)
eGFR: 90 mL/min/{1.73_m2} (ref >=60)

## 2022-12-11 LAB — TSH: TSH: 0.64 mIU/L (ref 0.40–4.50)

## 2022-12-13 DIAGNOSIS — H26491 Other secondary cataract, right eye: Secondary | ICD-10-CM | POA: Diagnosis not present

## 2022-12-14 ENCOUNTER — Ambulatory Visit: Payer: Medicare Other | Admitting: Gastroenterology

## 2022-12-15 ENCOUNTER — Encounter: Payer: Self-pay | Admitting: Gastroenterology

## 2022-12-15 ENCOUNTER — Ambulatory Visit (INDEPENDENT_AMBULATORY_CARE_PROVIDER_SITE_OTHER): Payer: Medicare Other | Admitting: Gastroenterology

## 2022-12-15 VITALS — BP 125/72 | HR 77 | Temp 98.5°F | Ht 60.0 in | Wt 145.1 lb

## 2022-12-15 DIAGNOSIS — K5010 Crohn's disease of large intestine without complications: Secondary | ICD-10-CM

## 2022-12-15 DIAGNOSIS — M8580 Other specified disorders of bone density and structure, unspecified site: Secondary | ICD-10-CM

## 2022-12-15 MED ORDER — HYDROCORTISONE ACETATE 25 MG RE SUPP: 25.0000 mg | Freq: Every day | RECTAL | 0 refills | Status: DC

## 2022-12-15 NOTE — Progress Notes (Signed)
[name], MD [address]  [place]  [place], [place] [address]  Main: [phone]  Fax: [phone]    Gastroenterology Consultation  Referring Provider:     Delsa Grana, PA-C Primary Care Physician:  Delsa Grana, PA-C Primary Gastroenterologist:  Dr. [name] Reason for Consultation: Crohn's colitis        HPI:   KRYSTIANNA GLIDDEN is a 67 y.o. African-American female referred by Delsa Grana, PA-C  for consultation & management of Crohn's disease.  Patient has not seen me since May 2022.  She self discontinued Entyvio sometime late 2022 because she was she was doing so well. Since December 2023, she has been experiencing urgency, loss of appetite, rectal bleeding and loose stools secondary to exacerbation of Crohn's colitis.  Stool studies were negative for infection and her fecal calprotectin levels were significantly elevated to 1140.  Subsequently underwent colonoscopy which revealed mild chronic active colitis extending from sigmoid to cecum, but moderate to severe proctitis.  Weyman Rodney has been restarted, patient received 3 induction doses, scheduled to receive first maintenance dose on April 2.  She has done remarkably well, received short course of prednisone.  Currently off prednisone.  Her main concern today is fecal soiling even though denies having any diarrhea, reports having bowel movement every other day only.  Denies any rectal bleeding, rectal pain or urgency.   Crohn's disease classification:  Age: > 40 Location: ileocolonic  Behavior: non stricturing, non penetrating  Perianal: no  IBD diagnosis: 11/2013  Disease course:Crohn's disease in 2015, initial symptoms were abdominal pain, blood in stool and on wiping, bloating. She had a colonoscopy, VCE, EGD at the time of diagnosis. She was initially on mesalamine 4 pills daily, prednisone later switched to Remicade in 11/2014. She took only 5 doses as she could not afford. Then she was switched to  Imuran 50 mg daily. She had mild flareups about twice a year. Colonoscopy 05/2017 revealed mild ileocolonic disease primarily on histology. She self discontinued Imuran. Lialda started in 06/2017.  Possible exacerbation of Crohn's disease with elevated fecal calprotectin in 10/2018.  CT abdomen and pelvis unremarkable.  Discontinued Lialda, started on azathioprine 50 mg daily.  Flareup of Crohn's in 10/2018, confirmed on colonoscopy and elevated fecal calprotectin levels.  Short prednisone course and started Entyvio on 05/23/2019 Discontinued azathioprine in 09/2019.  Normal fecal calprotectin levels, normal CRP in 09/2019.  Has been in clinical and histologic remission.  Patient self discontinued Entyvio in 06/2021 because she felt she has been doing well and did not have to take it long-term.  Resulted in exacerbation of Crohn's colitis, elevated fecal calprotectin levels >1000, colonoscopy in January 2024 revealed mild chronic active colitis in the colon, moderate to severe proctitis.  Weyman Rodney has been reinitiated in January 2024  Extra intestinal manifestations: None  IBD surgical history: None No family history of IBD  Imaging:  MRE none CTE none SBFT none  Procedures: Colonoscopy 09/30/2022 DIAGNOSIS:  A. COLON, RIGHT; COLD BIOPSY:  - MILD CHRONIC INACTIVE COLITIS CONSISTENT WITH PATIENT'S KNOWN HISTORY  OF CROHN'S DISEASE.  - NEGATIVE FOR GRANULOMA, DYSPLASIA AND MALIGNANCY.   B.  COLON, LEFT; COLD BIOPSY:  - MILD CHRONIC INACTIVE COLITIS, CONSISTENT WITH PATIENT'S KNOWN HISTORY  OF CROHN'S DISEASE.  - NEGATIVE FOR GRANULOMA, DYSPLASIA OR MALIGNANCY.   C RECTUM; COLD BIOPSY:  - CHRONIC, MODERATE TO MARKEDLY ACTIVE COLITIS WITH ULCERATION AND  CRYPTITIS, CONSISTENT WITH PATIENT'S KNOWN HISTORY OF CROHN'S DISEASE.  - NEGATIVE  FOR GRANULOMA, DYSPLASIA AND MALIGNANCY.   Colonoscopy 07/16/2020 - The examined portion of the ileum was normal. - The entire examined colon is normal.  Biopsied. - The distal rectum and anal verge are normal on retroflexion view. - Simple Endoscopic Score for Crohn's Disease: 0, mucosal inflammatory changes secondary to quiescent Crohn's disease. DIAGNOSIS:  A. COLON POLYP, CECUM; COLD SNARE:  - HYPERPLASTIC POLYP.  - NEGATIVE FOR DYSPLASIA AND MALIGNANCY.   B.  COLON, RIGHT; COLD BIOPSY:  - FOCAL MILD CHRONIC INACTIVE COLITIS CONSISTENT WITH PATIENT'S KNOWN  HISTORY OF CROHN'S DISEASE.  - NEGATIVE FOR DYSPLASIA AND MALIGNANCY.   C.  COLON, LEFT; COLD BIOPSY:  - MILD CHRONIC INACTIVE COLITIS CONSISTENT WITH PATIENT'S KNOWN HISTORY  OF CROHN'S DISEASE.  - NEGATIVE FOR DYSPLASIA AND MALIGNANCY.   D.  RECTUM; COLD BIOPSY:  - UNREMARKABLE COLONIC TYPE MUCOSA.  - NEGATIVE FOR DYSPLASIA AND MALIGNANCY.   Colonoscopy : 01/28/2005, showed external hemorrhoids only Colonoscopy 12/10/2013, showed normal terminal ileum, biopsies performed. Skipped areas of nonbleeding ulcerative mucosa were seen in the entire colon, biopsies performed. 4 mm polyp in the sigmoid colon Pathology: Rectal biopsy mild-to-moderate active proctitis with architectural features of chronicity, negative for dysplasia and malignancy. Terminal ileum: Small bowel mucosa with preserved villous architecture. Right colon biopsy: No significant pathologic changes  Upper Endoscopy 12/10/2013, underwent dilation of the esophagus Ampullary biopsy: Normal, gastric biopsy: Intramucosal with erosion and mild chronic gastritis, negative for H. pylori, dysplasia and malignancy  VCE 05/06/2014: Erosions in the distal ileum  EGD and colonoscopy 03/08/2019  DIAGNOSIS:  A.  DUODENUM POLYP; COLD BIOPSY:  - DUODENAL MUCOSA WITH PROMINENT BRUNNER'S GLAND AND REACTIVE CHANGES IN  OVERLYING VILLI.  - [place] ENTERITIS, DYSPLASIA, AND MALIGNANCY.   B.  STOMACH, RANDOM; COLD BIOPSY:  - ANTRAL MUCOSA WITH CHANGES CONSISTENT WITH ENDOSCOPIC FINDING OF   ANTRAL EROSION.  - FRAGMENTS OF UNREMARKABLE OXYNTIC MUCOSA.  - NEGATIVE FOR H. PYLORI, DYSPLASIA, AND MALIGNANCY.   C.  TERMINAL ILEUM; COLD BIOPSY:  - UNREMARKABLE SMALL INTESTINAL MUCOSA.  - NEGATIVE FOR ACTIVE ENTERITIS, DYSPLASIA, AND MALIGNANCY.   D.  COLON, RANDOM RIGHT; COLD BIOPSY:  - MODERATE CHRONIC ACTIVE COLITIS CONSISTENT WITH PATIENT'S KNOWN  HISTORY OF CROHN'S.  - NEGATIVE FOR VIRAL CYTOPATHIC EFFECT, DYSPLASIA, AND MALIGNANCY.   E.  COLON, RANDOM TRANSVERSE; COLD BIOPSY:  - MILD CHRONIC ACTIVE COLITIS INVOLVING A MINORITY OF THE BIOPSY  FRAGMENTS.  - CHRONIC INACTIVE COLITIS INVOLVING THE MAJORITY OF THE BIOPSY  FRAGMENTS.  - NEGATIVE FOR VIRAL CYTOPATHIC EFFECT, DYSPLASIA, AND MALIGNANCY.   F.  COLON, RANDOM LEFT; COLD BIOPSY:  - PATCHY MILD CHRONIC INACTIVE COLITIS.  - NEGATIVE FOR DYSPLASIA AND MALIGNANCY.   G.  RECTUM, RANDOM PROCTITIS; COLD BIOPSY:  - MARKED CHRONIC ACTIVE PROCTITIS CONSISTENT WITH PATIENT'S KNOWN  HISTORY OF CROHN'S.  - NEGATIVE FOR VIRAL CYTOPATHIC EFFECT, DYSPLASIA, AND MALIGNANCY.  EGD 06/08/2017 Normal esophagus, small hiatal hernia, normal stomach and duodenum  Colonoscopy 06/08/2017 The perianal and digital rectal examinations were normal. Pertinent negatives include normal sphincter tone and no palpable rectal Lesions. The terminal ileum appeared normal. Biopsies were taken with a cold forceps for histology. Two scattered non-bleeding aphthae were found in the ascending colon. Rest of the colon and rectum appeared normal. Random biopsies performed   IBD medications:  Steroids: Prednisone steroid responsive, budesonide 5-ASA: mesalamine, Lialda in the past Immunomodulators: azathioprine, discontinued in 2020 TPMT status normal Biologics:  Anti TNFs: Remicade monotherapy,  received induction followed by 1 maintenance dose in 11/2014 Anti Integrins: Entyvio started on 05/23/2019 Ustekinumab: Tofactinib: Clinical trial:   Past  Medical History:  Diagnosis Date   Abdominal pain, epigastric    Acute metabolic encephalopathy [id number]   Allergic rhinitis    Arthritis    Asthma    Breast cancer ([place]) 2006   LT LUMPECTOMY   Clotting disorder ([place])    Cognitive deficits as late effect of cerebrovascular disease    Convulsions, unspecified convulsion type ([place]) 09/24/2022   COPD (chronic obstructive pulmonary disease) ([place])    COVID-19 virus infection 04/2021   Crohn's disease ([place]) 05/21/2015   FOLLOWED BY GI   Crohn's disease of both small and large intestine with rectal bleeding ([facility]) 12/04/2014   Depression    Currently taking zoloft.   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 05/2021 Echo: EF 60-65%, no rwma, nl RV fxn.   History of kidney stones    Hypercholesterolemia 01/18/2007   Hyperlipidemia    Hypertension    Hypothyroidism    Low back pain radiating to left leg 06/17/2021   Nonobstructive CAD (coronary artery disease)    a. 10/2012 Cath: diffuse minor irregs-->med rx; b. 08/2021 Cor CTA: Ca2+ = 55.6 (77th%'ile), LAD 25p, LCX <25p. No signif non-cardiac findings.   Occlusion, cerebral artery    NOS w/infarction   Osteoporosis    Overweight (BMI 25.0-29.9) 07/17/2022   Personal history of radiation therapy 2006   BREAST CA   Pulmonary embolism ([place])    a.04/2021 CTA Chest: Small filling defects are noted in upper and lower lobe branches of L PA-->acute PE-->eliquis. *PE occurred in context of COVID infxn.   Stroke [facility])    Ulcer     Past Surgical History:  Procedure Laterality Date   BREAST BIOPSY Left 2006   POS   BREAST LUMPECTOMY Left 09/20/2004   positive   BREAST SURGERY Left    malignant biopsy   CARDIAC CATHETERIZATION     COLONOSCOPY   2015   COLONOSCOPY WITH PROPOFOL N/A 06/08/2017   Procedure: COLONOSCOPY WITH PROPOFOL;  Surgeon: Lin Landsman, MD;  Location: [facility];  Service: Gastroenterology;  Laterality: N/A;   COLONOSCOPY WITH PROPOFOL N/A 03/08/2019   Procedure: COLONOSCOPY WITH PROPOFOL;  Surgeon: Lin Landsman, MD;  Location[facility] ENDOSCOPY;  Service: Gastroenterology;  Laterality: N/A;   COLONOSCOPY WITH PROPOFOL N/A 07/16/2020   Procedure: COLONOSCOPY WITH PROPOFOL;  Surgeon: Lin Landsman, MD;  Location: [facility] ENDOSCOPY;  Service: Gastroenterology;  Laterality: N/A;   COLONOSCOPY WITH PROPOFOL N/A 09/30/2022   Procedure: COLONOSCOPY WITH PROPOFOL;  Surgeon: Lin Landsman, MD;  Location: [facility] ENDOSCOPY;  Service: Gastroenterology;  Laterality: N/A;   ESOPHAGOGASTRODUODENOSCOPY  2015   ESOPHAGOGASTRODUODENOSCOPY (EGD) WITH PROPOFOL N/A 06/08/2017   Procedure: ESOPHAGOGASTRODUODENOSCOPY (EGD) WITH PROPOFOL;  Surgeon: Lin Landsman, MD;  Location: [facility];  Service: Gastroenterology;  Laterality: N/A;   ESOPHAGOGASTRODUODENOSCOPY (EGD) WITH PROPOFOL N/A 03/08/2019   Procedure: ESOPHAGOGASTRODUODENOSCOPY (EGD) WITH PROPOFOL;  Surgeon: Lin Landsman, MD;  Location: [facility] ENDOSCOPY;  Service: Gastroenterology;  Laterality: N/A;   FINGER SURGERY     Right small finger   FRACTURE SURGERY     GIVENS CAPSULE STUDY  2015   TUBAL LIGATION       Current Outpatient Medications:    albuterol (VENTOLIN HFA) 108 (90 Base) MCG/ACT inhaler, Inhale 2 puffs into the lungs every 6 (six) hours as needed for wheezing or shortness of breath (chest tightness)., Disp: 8 g, Rfl: 2   apixaban (ELIQUIS) 5 MG TABS tablet, Take 1 tablet (5 mg total) by mouth 2 (two) times daily., Disp: 60 tablet, Rfl: 0   brimonidine (ALPHAGAN) 0.2 % ophthalmic solution, Place 1 drop into both eyes in the morning and at bedtime., Disp: , Rfl:    hydrocortisone (ANUSOL-HC) 25 MG suppository, Place 1  suppository (25 mg total) rectally at bedtime., Disp: 12 suppository, Rfl: 0   latanoprost (XALATAN) 0.005 % ophthalmic solution, Place 1 drop into both eyes at bedtime. , Disp: , Rfl:    levothyroxine (SYNTHROID) 75 MCG tablet, Take 1 tablet (75 mcg total) by mouth daily before breakfast., Disp: 90 tablet, Rfl: 0   loratadine (CLARITIN) 10 MG tablet, Take 1 tablet (10 mg total) by mouth daily., Disp: 30 tablet, Rfl: 11   losartan-hydrochlorothiazide (HYZAAR) 50-12.5 MG tablet, Take 1 tablet by mouth daily., Disp: 90 tablet, Rfl: 2   mometasone (NASONEX) 50 MCG/ACT nasal spray, Place 2 sprays into the nose daily., Disp: 1 each, Rfl: 12   Multiple Vitamin (MULTIVITAMIN) tablet, Take 1 tablet by mouth daily., Disp: , Rfl:    rosuvastatin (CRESTOR) 40 MG tablet, Take 1 tablet (40 mg total) by mouth daily., Disp: 90 tablet, Rfl: 0   sertraline (ZOLOFT) 50 MG tablet, Take 1 tablet (50 mg total) by mouth daily., Disp: 30 tablet, Rfl: 2   vedolizumab (ENTYVIO) 300 MG injection, Induction Entyvio 300mg  at week 0,2, and 6 and then maintenance does every 8 weeks, Disp: 1 each, Rfl: 12   Family History  Problem Relation Age of Onset   Heart disease Mother    Heart attack Mother    Cerebrovascular Accident Father    Arthritis Father    Hypertension Father    Dementia Father    Breast cancer Sister    Breast cancer Sister 84   Cancer Sister    Hypertension Other    Diabetes Other    Heart attack Other 25   Mental illness Neg Hx      Social History   Tobacco Use   Smoking status: Never   Smokeless tobacco: Never  Vaping Use   Vaping Use: Never used  Substance Use Topics   Alcohol use: No    Alcohol/week: 0.0 standard drinks of alcohol   Drug use: No    Allergies as of 12/15/2022   (No Known Allergies)    Review of Systems:    All systems reviewed and negative except where noted in HPI.   Physical Exam:  BP 125/72 (BP Location: Left Arm, Patient Position: Sitting, Cuff Size:  Normal)   Pulse 77   Temp 98.5 F (36.9 C) (Oral)   Ht 5' (1.524 m)   Wt 145 lb 2 oz (65.8 kg)   BMI 28.34 kg/m  No LMP recorded. Patient is postmenopausal.  General:   Alert,  Well-developed, well-nourished, pleasant and cooperative in NAD Head:  Normocephalic and atraumatic. Eyes:  Sclera clear, no icterus.   Conjunctiva pink. Ears:  Normal auditory acuity. Nose:  No deformity, discharge, or lesions. Mouth:  No deformity or lesions,oropharynx pink & moist. Neck:  Supple; no masses or thyromegaly. Lungs:  Respirations even and unlabored.  Clear throughout to auscultation.  No wheezes, crackles, or rhonchi. No acute distress. Heart:  Regular rate and rhythm; no murmurs, clicks, rubs, or gallops. Abdomen:  Normal bowel sounds.  No bruits.  Soft, nontender, and non-distended without masses, hepatosplenomegaly or hernias noted.  No guarding or rebound tenderness.   Rectal: Nor performed Msk:  Symmetrical without gross deformities. Good, equal movement & strength bilaterally. Pulses:  Normal pulses noted. Extremities:  No clubbing or edema.  No cyanosis. Neurologic:  Alert and oriented x3;  grossly normal neurologically. Skin:  Intact without significant lesions or rashes. No jaundice. Psych:  Alert and cooperative. Normal mood and affect.  Imaging Studies: MRI reviewed  Assessment and Plan:   EASTER SIMPSON is a 67 y.o. African-American female with Terminal ileal and colonic Crohn's, diagnosed in 2015 who was previously maintained on Imuran 50 mg daily with mild intermittent flareups about twice a year needing prednisone. She does not have any endoscopy evidence of ileocolonic Crohn's disease other than a few scattered aphthae in the ascending colon on colonoscopy in 05/2017 but histology revealed chronic mild active ileitis and right-sided colitis. MR enterography did not inflammation in small bowel. I tried to give her budesonide but she could not afford high co-pay of $500 for 30  days supply.  She stopped Imuran by herself in the past.  Recent flareup of her Crohn's disease with worsening of upper abdominal pain associated with nausea, unintentional weight loss, poor p.o. intake. Fecal calprotectin levels are elevated.  Discontinued Lialda, currently on azathioprine 50 mg daily.  She took prednisone for 2 days only due to new onset of rash and itching.  Rash has resolved.  EGD was unremarkable.  Repeat colonoscopy revealed active Crohn's colitis, worse in the rectum with mild symptoms.  Started on Entyvio on 05/23/2019, which has resulted in clinical and histologic remission.  Stopped Entyvio in late 2022, resulted in exacerbation of Crohn's in 08/2022, restarted with reinduction and maintenance in 09/2022  Crohn's disease of large intestine, nonstricturing, nonpenetrating Previously in clinical and histologic remission followed by exacerbation in 08/2022 secondary to self discontinuation of Entyvio Colonoscopy in 09/2021 revealed active colitis Interval has been reinitiated Currently in clinical remission Recheck fecal calprotectin levels  Fecal soiling: Has moderate to severe proctitis Discussed about Kegel exercises Trial of Anusol suppository QHS for 12 days  Chronic GERD, well controlled EGD in 2020 was normal Currently not on PPI and asymptomatic  IBD Health Maintenance  1.TB status: Gold quantiferon negative 09/2019 2. Anemia: normal Hb, vitamin B12 and ferritin levels are normal 3.Immunizations: Hep A immune, hepatitis B immune, and annual influenza vaccine, prevnar received in 06/2017, pneumovax administered on 03/27/2019.  Received first dose of Shingrix vaccine on 04/11/2019, and booster on 01/29/2021 4.Cancer screening I) Colon cancer/dysplasia surveillance: Colonoscopy 07/16/2020, no evidence of dysplasia and no precancerous polyps identified, recommend colonoscopy as above II) Cervical cancer: n/a III) Skin cancer - n/a  5.Bone health Vitamin D status:  Normal Bone density testing: On 09/25/2020, revealed osteopenia,, repeat DEXA scan Normal vitamin D levels in the past 5. Labs: Every with every other infusion 6. Smoking: Never smoked 7. NSAIDs and Antibiotics use: Denies NSAID use and antibiotic use  Follow up in 6 months  [name], MD

## 2022-12-15 NOTE — Patient Instructions (Addendum)
I got your Bone density scan schedule for May 13 at 1:40pm. No calcium supplements the day of the exam. If you need to reschedule please call [phone] option 2.   Kegel Exercises  Kegel exercises can help strengthen your pelvic floor muscles. The pelvic floor is a group of muscles that support your rectum, small intestine, and bladder. In females, pelvic floor muscles also help support the uterus. These muscles help you control the flow of urine and stool (feces). Kegel exercises are painless and simple. They do not require any equipment. Your provider may suggest Kegel exercises to: Improve bladder and bowel control. Improve sexual response. Improve weak pelvic floor muscles after surgery to remove the uterus (hysterectomy) or after pregnancy, in females. Improve weak pelvic floor muscles after prostate gland removal or surgery, in males. Kegel exercises involve squeezing your pelvic floor muscles. These are the same muscles you squeeze when you try to stop the flow of urine or keep from passing gas. The exercises can be done while sitting, standing, or lying down, but it is best to vary your position. Ask your health care provider which exercises are safe for you. Do exercises exactly as told by your health care provider and adjust them as directed. Do not begin these exercises until told by your health care provider. Exercises How to do Kegel exercises: Squeeze your pelvic floor muscles tight. You should feel a tight lift in your rectal area. If you are a female, you should also feel a tightness in your vaginal area. Keep your stomach, buttocks, and legs relaxed. Hold the muscles tight for up to 10 seconds. Breathe normally. Relax your muscles for up to 10 seconds. Repeat as told by your health care provider. Repeat this exercise daily as told by your health care provider. Continue to do this exercise for at least 4-6 weeks, or for as long as told by your health care provider. You may be  referred to a physical therapist who can help you learn more about how to do Kegel exercises. Depending on your condition, your health care provider may recommend: Varying how long you squeeze your muscles. Doing several sets of exercises every day. Doing exercises for several weeks. Making Kegel exercises a part of your regular exercise routine. This information is not intended to replace advice given to you by your health care provider. Make sure you discuss any questions you have with your health care provider. Document Revised: 01/15/2021 Document Reviewed: 01/15/2021 Elsevier Patient Education  [place].

## 2022-12-16 ENCOUNTER — Other Ambulatory Visit: Payer: Self-pay | Admitting: Family Medicine

## 2022-12-16 ENCOUNTER — Telehealth: Payer: Self-pay

## 2022-12-16 DIAGNOSIS — K5010 Crohn's disease of large intestine without complications: Secondary | ICD-10-CM | POA: Diagnosis not present

## 2022-12-16 DIAGNOSIS — E039 Hypothyroidism, unspecified: Secondary | ICD-10-CM

## 2022-12-16 MED ORDER — LEVOTHYROXINE SODIUM 75 MCG PO TABS: 75.0000 ug | ORAL_TABLET | Freq: Every day | ORAL | 3 refills | Status: DC

## 2022-12-16 NOTE — Telephone Encounter (Signed)
Can you please call and schedule patient 6 month appointment with Dr. Marius Ditch so her schedule does not get filled up.

## 2022-12-17 ENCOUNTER — Telehealth: Payer: Self-pay | Admitting: Family Medicine

## 2022-12-17 NOTE — Telephone Encounter (Signed)
Documented under result note.

## 2022-12-17 NOTE — Telephone Encounter (Signed)
Copied from [place] [phone]. Topic: General - Call Back - No Documentation >> Dec 17, 2022 10:09 AM Cyndi Bender wrote: Reason for CRM: Pt reports that she was returning a missed call that she received. Cb# [phone]

## 2022-12-20 ENCOUNTER — Other Ambulatory Visit: Payer: Self-pay | Admitting: Family Medicine

## 2022-12-20 DIAGNOSIS — F325 Major depressive disorder, single episode, in full remission: Secondary | ICD-10-CM

## 2022-12-20 DIAGNOSIS — G4750 Parasomnia, unspecified: Secondary | ICD-10-CM

## 2022-12-21 DIAGNOSIS — M9933 Osseous stenosis of neural canal of lumbar region: Secondary | ICD-10-CM | POA: Diagnosis not present

## 2022-12-21 DIAGNOSIS — M533 Sacrococcygeal disorders, not elsewhere classified: Secondary | ICD-10-CM | POA: Diagnosis not present

## 2022-12-21 DIAGNOSIS — G8929 Other chronic pain: Secondary | ICD-10-CM | POA: Diagnosis not present

## 2022-12-21 DIAGNOSIS — M5442 Lumbago with sciatica, left side: Secondary | ICD-10-CM | POA: Diagnosis not present

## 2022-12-23 ENCOUNTER — Encounter: Payer: Self-pay | Admitting: Family Medicine

## 2022-12-23 ENCOUNTER — Other Ambulatory Visit: Payer: Self-pay

## 2022-12-23 LAB — CALPROTECTIN, FECAL: Calprotectin, Fecal: 37 ug/g (ref 0–120)

## 2022-12-23 MED ORDER — SERTRALINE HCL 50 MG PO TABS: 50.0000 mg | ORAL_TABLET | Freq: Every day | ORAL | 1 refills | Status: DC

## 2022-12-30 ENCOUNTER — Ambulatory Visit: Payer: Medicare Other

## 2022-12-30 ENCOUNTER — Ambulatory Visit: Payer: Medicare Other | Admitting: Family Medicine

## 2023-01-01 ENCOUNTER — Encounter: Payer: Self-pay | Admitting: Gastroenterology

## 2023-01-04 DIAGNOSIS — R079 Chest pain, unspecified: Secondary | ICD-10-CM | POA: Diagnosis not present

## 2023-01-04 DIAGNOSIS — Z03818 Encounter for observation for suspected exposure to other biological agents ruled out: Secondary | ICD-10-CM | POA: Diagnosis not present

## 2023-01-04 DIAGNOSIS — J019 Acute sinusitis, unspecified: Secondary | ICD-10-CM | POA: Diagnosis not present

## 2023-01-04 DIAGNOSIS — M546 Pain in thoracic spine: Secondary | ICD-10-CM | POA: Diagnosis not present

## 2023-01-17 ENCOUNTER — Ambulatory Visit
Admission: RE | Admit: 2023-01-17 | Discharge: 2023-01-17 | Disposition: A | Discharge status: Home / Self Care | Payer: Medicare Other | Source: Ambulatory Visit | Attending: Gastroenterology | Admitting: Gastroenterology

## 2023-01-17 DIAGNOSIS — K5090 Crohn's disease, unspecified, without complications: Secondary | ICD-10-CM | POA: Diagnosis not present

## 2023-01-17 MED ORDER — VEDOLIZUMAB 300 MG IV SOLR
300.0000 mg | Freq: Once | INTRAVENOUS | Status: AC
  Administered 2023-01-17: 300 mg via INTRAVENOUS
  Filled 2023-01-17: qty 5

## 2023-01-28 DIAGNOSIS — G8929 Other chronic pain: Secondary | ICD-10-CM | POA: Diagnosis not present

## 2023-01-28 DIAGNOSIS — I69359 Hemiplegia and hemiparesis following cerebral infarction affecting unspecified side: Secondary | ICD-10-CM | POA: Diagnosis not present

## 2023-01-28 DIAGNOSIS — G4752 REM sleep behavior disorder: Secondary | ICD-10-CM | POA: Diagnosis not present

## 2023-01-28 DIAGNOSIS — R519 Headache, unspecified: Secondary | ICD-10-CM | POA: Diagnosis not present

## 2023-01-28 DIAGNOSIS — R2689 Other abnormalities of gait and mobility: Secondary | ICD-10-CM | POA: Diagnosis not present

## 2023-01-28 DIAGNOSIS — G4733 Obstructive sleep apnea (adult) (pediatric): Secondary | ICD-10-CM | POA: Diagnosis not present

## 2023-01-28 DIAGNOSIS — M545 Low back pain, unspecified: Secondary | ICD-10-CM | POA: Diagnosis not present

## 2023-01-31 ENCOUNTER — Ambulatory Visit
Admission: RE | Admit: 2023-01-31 | Discharge: 2023-01-31 | Disposition: A | Discharge status: Home / Self Care | Payer: Medicare Other | Source: Ambulatory Visit | Attending: Gastroenterology | Admitting: Gastroenterology

## 2023-01-31 DIAGNOSIS — K5010 Crohn's disease of large intestine without complications: Secondary | ICD-10-CM | POA: Diagnosis not present

## 2023-01-31 DIAGNOSIS — M8589 Other specified disorders of bone density and structure, multiple sites: Secondary | ICD-10-CM | POA: Insufficient documentation

## 2023-01-31 DIAGNOSIS — M8580 Other specified disorders of bone density and structure, unspecified site: Secondary | ICD-10-CM | POA: Insufficient documentation

## 2023-01-31 DIAGNOSIS — M85851 Other specified disorders of bone density and structure, right thigh: Secondary | ICD-10-CM | POA: Diagnosis not present

## 2023-01-31 DIAGNOSIS — M069 Rheumatoid arthritis, unspecified: Secondary | ICD-10-CM | POA: Insufficient documentation

## 2023-01-31 DIAGNOSIS — Z780 Asymptomatic menopausal state: Secondary | ICD-10-CM | POA: Insufficient documentation

## 2023-02-01 ENCOUNTER — Other Ambulatory Visit: Payer: Self-pay | Admitting: Student

## 2023-02-01 ENCOUNTER — Telehealth: Payer: Self-pay

## 2023-02-01 DIAGNOSIS — R519 Headache, unspecified: Secondary | ICD-10-CM

## 2023-02-01 NOTE — Telephone Encounter (Signed)
Called patient and and patient verbalized understanding of results

## 2023-02-01 NOTE — Telephone Encounter (Signed)
-----   Message from Toney Reil, MD sent at 01/31/2023  3:27 PM EDT ----- Her bone density testing shows she is osteopenic.  I see that her vitamin D levels are normal. Follow-up with PCP and needs close monitoring only  RV

## 2023-02-04 ENCOUNTER — Ambulatory Visit (INDEPENDENT_AMBULATORY_CARE_PROVIDER_SITE_OTHER): Payer: Medicare Other

## 2023-02-04 VITALS — BP 126/78 | Ht 60.0 in | Wt 144.6 lb

## 2023-02-04 DIAGNOSIS — Z0000 Encounter for general adult medical examination without abnormal findings: Secondary | ICD-10-CM | POA: Diagnosis not present

## 2023-02-04 NOTE — Progress Notes (Signed)
Subjective:   VERTINA MEAS is a 67 y.o. female who presents for Medicare Annual (Subsequent) preventive examination.  Review of Systems    Cardiac Risk Factors include: advanced age (>69men, >29 women);dyslipidemia;hypertension    Objective:    Today's Vitals   02/04/23 1253 02/04/23 1257  BP: 126/78   Weight: 144 lb 9.6 oz (65.6 kg)   Height: 5' (1.524 m)   PainSc:  3    Body mass index is 28.24 kg/m.     02/04/2023    1:07 PM 11/23/2022   11:15 AM 09/30/2022    9:30 AM 08/20/2022    1:16 PM 08/06/2022    1:44 PM 07/19/2022    2:15 PM 07/16/2022    4:20 PM  Advanced Directives  Does Patient Have a Medical Advance Directive? No No No Yes No No No  Would patient like information on creating a medical advance directive?     No - Patient declined No - Patient declined No - Patient declined    Current Medications (verified) Outpatient Encounter Medications as of 02/04/2023  Medication Sig   albuterol (VENTOLIN HFA) 108 (90 Base) MCG/ACT inhaler Inhale 2 puffs into the lungs every 6 (six) hours as needed for wheezing or shortness of breath (chest tightness).   apixaban (ELIQUIS) 5 MG TABS tablet Take 1 tablet (5 mg total) by mouth 2 (two) times daily.   brimonidine (ALPHAGAN) 0.2 % ophthalmic solution Place 1 drop into both eyes in the morning and at bedtime.   latanoprost (XALATAN) 0.005 % ophthalmic solution Place 1 drop into both eyes at bedtime.    levothyroxine (SYNTHROID) 75 MCG tablet Take 1 tablet (75 mcg total) by mouth daily before breakfast.   loratadine (CLARITIN) 10 MG tablet Take 1 tablet (10 mg total) by mouth daily.   losartan-hydrochlorothiazide (HYZAAR) 50-12.5 MG tablet Take 1 tablet by mouth daily.   mometasone (NASONEX) 50 MCG/ACT nasal spray Place 2 sprays into the nose daily.   Multiple Vitamin (MULTIVITAMIN) tablet Take 1 tablet by mouth daily.   rosuvastatin (CRESTOR) 40 MG tablet Take 1 tablet (40 mg total) by mouth daily.   sertraline (ZOLOFT) 50 MG  tablet Take 1 tablet (50 mg total) by mouth daily.   vedolizumab (ENTYVIO) 300 MG injection Induction Entyvio 300mg  at week 0,2, and 6 and then maintenance does every 8 weeks   hydrocortisone (ANUSOL-HC) 25 MG suppository Place 1 suppository (25 mg total) rectally at bedtime. (Patient not taking: Reported on 01/17/2023)   No facility-administered encounter medications on file as of 02/04/2023.    Allergies (verified) Patient has no known allergies.   History: Past Medical History:  Diagnosis Date   Abdominal pain, epigastric    Acute metabolic encephalopathy 07/22/2022   Allergic rhinitis    Arthritis    Asthma    Breast cancer (HCC) 2006   LT LUMPECTOMY   Cataract    Clotting disorder (HCC)    Cognitive deficits as late effect of cerebrovascular disease    Convulsions, unspecified convulsion type (HCC) 09/24/2022   COPD (chronic obstructive pulmonary disease) (HCC)    COVID-19 virus infection 04/2021   Crohn's disease (HCC) 05/21/2015   FOLLOWED BY GI   Crohn's disease of both small and large intestine with rectal bleeding (HCC) 12/04/2014   Depression    Currently taking zoloft.   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 05/2021 Echo: EF 60-65%, no rwma, nl RV fxn.   History of kidney stones    Hypercholesterolemia 01/18/2007   Hyperlipidemia    Hypertension    Hypothyroidism    Low back pain radiating to left leg 06/17/2021   Nonobstructive CAD (coronary artery disease)    a. 10/2012 Cath: diffuse minor irregs-->med rx; b. 08/2021 Cor CTA: Ca2+ = 55.6 (77th%'ile), LAD 25p, LCX <25p. No signif non-cardiac findings.   Occlusion, cerebral artery    NOS w/infarction   Osteoporosis    Overweight (BMI 25.0-29.9) 07/17/2022    Personal history of radiation therapy 2006   BREAST CA   Pulmonary embolism (HCC)    a.04/2021 CTA Chest: Small filling defects are noted in upper and lower lobe branches of L PA-->acute PE-->eliquis. *PE occurred in context of COVID infxn.   Stroke [facility])    Ulcer    Past Surgical History:  Procedure Laterality Date   BREAST BIOPSY Left 2006   POS   BREAST LUMPECTOMY Left 09/20/2004   positive   BREAST SURGERY Left    malignant biopsy   CARDIAC CATHETERIZATION     COLONOSCOPY  2015   COLONOSCOPY WITH PROPOFOL N/A 06/08/2017   Procedure: COLONOSCOPY WITH PROPOFOL;  Surgeon: Toney Reil, MD;  Location: [facility];  Service: Gastroenterology;  Laterality: N/A;   COLONOSCOPY WITH PROPOFOL N/A 03/08/2019   Procedure: COLONOSCOPY WITH PROPOFOL;  Surgeon: Toney Reil, MD;  Location: [facility] ENDOSCOPY;  Service: Gastroenterology;  Laterality: N/A;   COLONOSCOPY WITH PROPOFOL N/A 07/16/2020   Procedure: COLONOSCOPY WITH PROPOFOL;  Surgeon: Toney Reil, MD;  Location: [facility] ENDOSCOPY;  Service: Gastroenterology;  Laterality: N/A;   COLONOSCOPY WITH PROPOFOL N/A 09/30/2022   Procedure: COLONOSCOPY WITH PROPOFOL;  Surgeon: Toney Reil, MD;  Location: [facility] ENDOSCOPY;  Service: Gastroenterology;  Laterality: N/A;   ESOPHAGOGASTRODUODENOSCOPY  2015   ESOPHAGOGASTRODUODENOSCOPY (EGD) WITH PROPOFOL N/A 06/08/2017   Procedure: ESOPHAGOGASTRODUODENOSCOPY (EGD) WITH PROPOFOL;  Surgeon: Toney Reil, MD;  Location: [facility];  Service: Gastroenterology;  Laterality: N/A;   ESOPHAGOGASTRODUODENOSCOPY (EGD) WITH PROPOFOL N/A 03/08/2019   Procedure: ESOPHAGOGASTRODUODENOSCOPY (EGD) WITH PROPOFOL;  Surgeon: Toney Reil, MD;  Location: [facility] ENDOSCOPY;  Service: Gastroenterology;  Laterality: N/A;   EYE SURGERY     FINGER SURGERY     Right small finger   FRACTURE SURGERY     GIVENS CAPSULE STUDY  2015   TUBAL LIGATION     Family History  Problem  Relation Age of Onset   Heart disease Mother    Heart attack Mother    Hypertension Mother    Varicose Veins Mother    Cerebrovascular Accident Father    Arthritis Father    Hypertension Father    Dementia Father    Breast cancer Sister    Cancer Sister    Breast cancer Sister 40   Cancer Sister    Cancer Sister    Hypertension Other    Diabetes Other    Heart attack Other 51   Mental illness Neg Hx    Social History   Socioeconomic History   Marital status: Married    Spouse name: Geanie Cooley   Number of children: 2   Years of education: Not on file   Highest education level: Some college, no degree  Occupational History   Not on file  Tobacco Use   Smoking status: Never   Smokeless tobacco: Never  Vaping Use   Vaping Use: Never used  Substance and Sexual Activity   Alcohol use: Never   Drug use: Never   Sexual activity: Not Currently    Partners: Male    Birth control/protection: Surgical  Other Topics Concern   Not on file  Social History Narrative   Married. One son and one daughter. She is disabled after she had breast cancer and strokes. 2 caffeinated beverages daily.   Social Determinants of Health   Financial Resource Strain: Low Risk  (02/04/2023)   Overall Financial Resource Strain (CARDIA)    Difficulty of Paying Living Expenses: Not hard at all  Food Insecurity: No Food Insecurity (02/04/2023)   Hunger Vital Sign    Worried About Running Out of Food in the Last Year: Never true    Ran Out of Food in the Last Year: Never true  Transportation Needs: No Transportation Needs (02/04/2023)   PRAPARE - Administrator, Civil Service (Medical): No    Lack of Transportation (Non-Medical): No  Physical Activity: Insufficiently Active (02/04/2023)   Exercise Vital Sign    Days of Exercise per Week: 3 days    Minutes of Exercise per Session: 30 min  Stress: No Stress Concern Present (02/04/2023)   [employer] of Occupational Health - Occupational  Stress Questionnaire    Feeling of Stress : Not at all  Social Connections: Socially Integrated (02/04/2023)   Social Connection and Isolation Panel [NHANES]    Frequency of Communication with Friends and Family: More than three times a week    Frequency of Social Gatherings with Friends and Family: Never    Attends Religious Services: More than 4 times per year    Active Member of [employer] or Organizations: Yes    Attends Banker Meetings: 1 to 4 times per year    Marital Status: Married    Tobacco Counseling Counseling given: Not Answered  Clinical Intake:  Pre-visit preparation completed: Yes  Pain : 0-10 Pain Score: 3  Pain Type: Acute pain Pain Location: Face Pain Orientation: Right Pain Descriptors / Indicators: Aching Pain Onset: 1 to 4 weeks ago Pain Frequency: Intermittent Pain Relieving Factors: Tylenol  Pain Relieving Factors: Tylenol  BMI - recorded: 28.24 Nutritional Status: BMI 25 -29 Overweight Nutritional Risks: None  How often do you need to have someone help you when you read instructions, pamphlets, or other written materials from your doctor or pharmacy?: 1 - Never  Diabetic?no  Interpreter Needed?: No  Comments: lives with husband Information entered by :: B.Rilyn Upshaw,LPN   Activities of Daily Living    02/04/2023    1:07 PM 12/10/2022   11:10 AM  In your present state of health, do you have any difficulty performing the following activities:  Hearing? 0 0  Vision? 0 0  Difficulty concentrating or making decisions? 0 0  Walking or climbing stairs? 0 0  Dressing or bathing? 0 0  Doing errands, shopping? 0 0  Preparing Food and eating ? N   Using the Toilet? N   In the past six months, have you accidently leaked urine? Y   Do you have problems with loss of bowel control? N   Managing your Medications? N   Managing your Finances? N   Housekeeping or managing your Housekeeping? N     Patient Care Team: Danelle Berry, PA-C as PCP  - General (Family Medicine) Antonieta Iba,  MD as PCP - Cardiology (Cardiology) Toney Reil, MD as Consulting Physician (Gastroenterology) Domingo Madeira, OD as Consulting Physician (Optometry) Linus Salmons, MD as Consulting Physician (Otolaryngology)  Indicate any recent Medical Services you may have received from other than [facility] providers in the past year (date may be approximate).     Assessment:   This is a routine wellness examination for Kamrey.  Hearing/Vision screen Hearing Screening - Comments:: Adequate hearing Vision Screening - Comments:: Adequate vision:cataracts removed both eyes;readers only Dr Larence Penning  Dietary issues and exercise activities discussed: Current Exercise Habits: Home exercise routine, Type of exercise: walking, Time (Minutes): 30, Frequency (Times/Week): 3, Weekly Exercise (Minutes/Week): 90, Intensity: Mild, Exercise limited by: neurologic condition(s)   Goals Addressed   None    Depression Screen    02/04/2023    1:03 PM 12/10/2022   11:10 AM 11/15/2022   10:14 AM 09/21/2022   10:33 AM 08/23/2022   10:29 AM 08/18/2022    4:59 PM 08/03/2022    1:09 PM  PHQ 2/9 Scores  PHQ - 2 Score 0 0 0 0 0  0  PHQ- 9 Score  0 0 0 0  0     Information is confidential and restricted. Go to Review Flowsheets to unlock data.    Fall Risk    02/04/2023    1:01 PM 12/10/2022   11:10 AM 11/15/2022   10:14 AM 09/21/2022   10:33 AM 08/23/2022   10:29 AM  Fall Risk   Falls in the past year? 0 0 0 0 0  Number falls in past yr: 0 0 0 0 0  Injury with Fall? 0 0 0 0 0  Risk for fall due to : No Fall Risks No Fall Risks No Fall Risks No Fall Risks No Fall Risks  Follow up Education provided;Falls prevention discussed Falls prevention discussed;Education provided;Falls evaluation completed Falls prevention discussed;Education provided;Falls evaluation completed Falls prevention discussed;Education provided;Falls evaluation completed Falls prevention  discussed;Education provided;Falls evaluation completed    FALL RISK PREVENTION PERTAINING TO THE HOME:  Any stairs in or around the home? Yes  If so, are there any without handrails? Yes  Home free of loose throw rugs in walkways, pet beds, electrical cords, etc? Yes  Adequate lighting in your home to reduce risk of falls? Yes   ASSISTIVE DEVICES UTILIZED TO PREVENT FALLS:  Life alert? No  Use of a cane, walker or w/c? No  Grab bars in the bathroom? No  Shower chair or bench in shower? Yes  Elevated toilet seat or a handicapped toilet? No   TIMED UP AND GO:  Was the test performed? Yes .  Length of time to ambulate 10 feet: 8  sec.   Gait steady and fast without use of assistive device  Cognitive Function:        02/04/2023    1:09 PM 08/02/2019   12:00 PM 11/02/2018    9:47 AM 03/28/2018   10:36 AM  6CIT Screen  What Year? 0 points 0 points 0 points 0 points  What month? 0 points 0 points 0 points 0 points  What time? 0 points 0 points 0 points 0 points  Count back from 20 0 points 0 points 0 points 0 points  Months in reverse 0 points 0 points 0 points 0 points  Repeat phrase 0 points 0 points 2 points 0 points  Total Score 0 points 0 points 2 points 0 points    Immunizations Immunization History  Administered Date(s)  Administered   Fluad Quad(high Dose 65+) 06/25/2019, 05/29/2020, 08/04/2021, 07/08/2022   Hepatitis A, Adult 06/22/2017   Hepatitis B, ADULT 06/22/2017   Hepb-cpg 04/11/2019   Influenza Whole 07/05/2012   Influenza, Seasonal, Injecte, Preservative Fre 06/08/2011   Influenza,inj,Quad PF,6+ Mos 07/31/2014, 07/07/2015, 06/15/2016, 06/22/2017, 06/23/2018   Influenza-Unspecified 07/21/2013, 07/31/2014, 07/07/2015, 06/15/2016, 06/22/2017, 06/23/2018   PFIZER Comirnaty(Gray Top)Covid-19 Tri-Sucrose Vaccine 02/11/2021   PFIZER(Purple Top)SARS-COV-2 Vaccination 10/27/2019, 11/03/2019, 11/24/2019, 06/10/2020, 07/24/2020   Pfizer Covid-19 Vaccine Bivalent  Booster 64yrs & up 07/08/2022   Pneumococcal Conjugate-13 06/22/2017   Pneumococcal Polysaccharide-23 03/27/2019   Tdap 03/28/2012   Zoster Recombinat (Shingrix) 04/11/2019, 01/29/2021    TDAP status: Up to date  Flu Vaccine status: Up to date  Pneumococcal vaccine status: Up to date  Covid-19 vaccine status: Completed vaccines  Qualifies for Shingles Vaccine? Yes   Zostavax completed Yes   Shingrix Completed?: Yes  Screening Tests Health Maintenance  Topic Date Due   COVID-19 Vaccine (8 - 2023-24 season) 09/02/2022   INFLUENZA VACCINE  04/21/2023   MAMMOGRAM  06/25/2023   Medicare Annual Wellness (AWV)  02/04/2024   DEXA SCAN  01/30/2025   COLONOSCOPY (Pts 45-49yrs Insurance coverage will need to be confirmed)  10/01/2027   Pneumonia Vaccine 43+ Years old  Completed   Hepatitis C Screening  Completed   Zoster Vaccines- Shingrix  Completed   HPV VACCINES  Aged Out   DTaP/Tdap/Td  Discontinued    Health Maintenance  Health Maintenance Due  Topic Date Due   COVID-19 Vaccine (8 - 2023-24 season) 09/02/2022    Colorectal cancer screening: Type of screening: Colonoscopy. Completed yes. Repeat every 5 years  Mammogram status: Completed yes. Repeat every year  Bone Density status: Completed yes. Results reflect: Bone density results: OSTEOPENIA. Repeat every 3 years.  Lung Cancer Screening: (Low Dose CT Chest recommended if Age 57-80 years, 30 pack-year currently smoking OR have quit w/in 15years.) does not qualify.   Lung Cancer Screening Referral: no  Additional Screening:  Hepatitis C Screening: does not qualify; Completed yes  Vision Screening: Recommended annual ophthalmology exams for early detection of glaucoma and other disorders of the eye. Is the patient up to date with their annual eye exam?  Yes  Who is the provider or what is the name of the office in which the patient attends annual eye exams? Dr Larence Penning If pt is not established with a provider, would  they like to be referred to a provider to establish care? No .   Dental Screening: Recommended annual dental exams for proper oral hygiene  Community Resource Referral / Chronic Care Management: CRR required this visit?  No   CCM required this visit?  No      Plan:     I have personally reviewed and noted the following in the patient's chart:   Medical and social history Use of alcohol, tobacco or illicit drugs  Current medications and supplements including opioid prescriptions. Patient is not currently taking opioid prescriptions. Functional ability and status Nutritional status Physical activity Advanced directives List of other physicians Hospitalizations, surgeries, and ER visits in previous 12 months Vitals Screenings to include cognitive, depression, and falls Referrals and appointments  In addition, I have reviewed and discussed with patient certain preventive protocols, quality metrics, and best practice recommendations. A written personalized care plan for preventive services as well as general preventive health recommendations were provided to patient.     Sue Lush, LPN   [id number]/61/0960   Nurse Notes:  The patient states they doing well and has no concerns or questions at this time.

## 2023-02-04 NOTE — Patient Instructions (Signed)
Carolyn Campbell , Thank you for taking time to come for your Medicare Wellness Visit. I appreciate your ongoing commitment to your health goals. Please review the following plan we discussed and let me know if I can assist you in the future.   These are the goals we discussed:  Goals      Patient Stated     Patient states she would like to travel more. She wants to visit grandchildren in [place].         This is a list of the screening recommended for you and due dates:  Health Maintenance  Topic Date Due   COVID-19 Vaccine (8 - 2023-24 season) 09/02/2022   Flu Shot  04/21/2023   Mammogram  06/25/2023   Medicare Annual Wellness Visit  02/04/2024   DEXA scan (bone density measurement)  01/30/2025   Colon Cancer Screening  10/01/2027   Pneumonia Vaccine  Completed   Hepatitis C Screening: USPSTF Recommendation to screen - Ages 75-79 yo.  Completed   Zoster (Shingles) Vaccine  Completed   HPV Vaccine  Aged Out   DTaP/Tdap/Td vaccine  Discontinued    Advanced directives: no  Conditions/risks identified: none  Next appointment: Follow up in one year for your annual wellness visit 02/10/2024 @ 1pm in person   Preventive Care 65 Years and Older, Female Preventive care refers to lifestyle choices and visits with your health care provider that can promote health and wellness. What does preventive care include? A yearly physical exam. This is also called an annual well check. Dental exams once or twice a year. Routine eye exams. Ask your health care provider how often you should have your eyes checked. Personal lifestyle choices, including: Daily care of your teeth and gums. Regular physical activity. Eating a healthy diet. Avoiding tobacco and drug use. Limiting alcohol use. Practicing safe sex. Taking low-dose aspirin every day. Taking vitamin and mineral supplements as recommended by your health care provider. What happens during an annual well check? The services and  screenings done by your health care provider during your annual well check will depend on your age, overall health, lifestyle risk factors, and family history of disease. Counseling  Your health care provider may ask you questions about your: Alcohol use. Tobacco use. Drug use. Emotional well-being. Home and relationship well-being. Sexual activity. Eating habits. History of falls. Memory and ability to understand (cognition). Work and work Astronomer. Reproductive health. Screening  You may have the following tests or measurements: Height, weight, and BMI. Blood pressure. Lipid and cholesterol levels. These may be checked every 5 years, or more frequently if you are over 66 years old. Skin check. Lung cancer screening. You may have this screening every year starting at age 74 if you have a 30-pack-year history of smoking and currently smoke or have quit within the past 15 years. Fecal occult blood test (FOBT) of the stool. You may have this test every year starting at age 68. Flexible sigmoidoscopy or colonoscopy. You may have a sigmoidoscopy every 5 years or a colonoscopy every 10 years starting at age 75. Hepatitis C blood test. Hepatitis B blood test. Sexually transmitted disease (STD) testing. Diabetes screening. This is done by checking your blood sugar (glucose) after you have not eaten for a while (fasting). You may have this done every 1-3 years. Bone density scan. This is done to screen for osteoporosis. You may have this done starting at age 47. Mammogram. This may be done every 1-2 years. Talk to your health  care provider about how often you should have regular mammograms. Talk with your health care provider about your test results, treatment options, and if necessary, the need for more tests. Vaccines  Your health care provider may recommend certain vaccines, such as: Influenza vaccine. This is recommended every year. Tetanus, diphtheria, and acellular pertussis (Tdap,  Td) vaccine. You may need a Td booster every 10 years. Zoster vaccine. You may need this after age 38. Pneumococcal 13-valent conjugate (PCV13) vaccine. One dose is recommended after age 42. Pneumococcal polysaccharide (PPSV23) vaccine. One dose is recommended after age 55. Talk to your health care provider about which screenings and vaccines you need and how often you need them. This information is not intended to replace advice given to you by your health care provider. Make sure you discuss any questions you have with your health care provider. Document Released: 10/03/2015 Document Revised: 05/26/2016 Document Reviewed: 07/08/2015 Elsevier Interactive Patient Education  2017 [employer].  Fall Prevention in the Home Falls can cause injuries. They can happen to people of all ages. There are many things you can do to make your home safe and to help prevent falls. What can I do on the outside of my home? Regularly fix the edges of walkways and driveways and fix any cracks. Remove anything that might make you trip as you walk through a door, such as a raised step or threshold. Trim any bushes or trees on the path to your home. Use bright outdoor lighting. Clear any walking paths of anything that might make someone trip, such as rocks or tools. Regularly check to see if handrails are loose or broken. Make sure that both sides of any steps have handrails. Any raised decks and porches should have guardrails on the edges. Have any leaves, snow, or ice cleared regularly. Use sand or salt on walking paths during winter. Clean up any spills in your garage right away. This includes oil or grease spills. What can I do in the bathroom? Use night lights. Install grab bars by the toilet and in the tub and shower. Do not use towel bars as grab bars. Use non-skid mats or decals in the tub or shower. If you need to sit down in the shower, use a plastic, non-slip stool. Keep the floor dry. Clean up any  water that spills on the floor as soon as it happens. Remove soap buildup in the tub or shower regularly. Attach bath mats securely with double-sided non-slip rug tape. Do not have throw rugs and other things on the floor that can make you trip. What can I do in the bedroom? Use night lights. Make sure that you have a light by your bed that is easy to reach. Do not use any sheets or blankets that are too big for your bed. They should not hang down onto the floor. Have a firm chair that has side arms. You can use this for support while you get dressed. Do not have throw rugs and other things on the floor that can make you trip. What can I do in the kitchen? Clean up any spills right away. Avoid walking on wet floors. Keep items that you use a lot in easy-to-reach places. If you need to reach something above you, use a strong step stool that has a grab bar. Keep electrical cords out of the way. Do not use floor polish or wax that makes floors slippery. If you must use wax, use non-skid floor wax. Do not have throw  rugs and other things on the floor that can make you trip. What can I do with my stairs? Do not leave any items on the stairs. Make sure that there are handrails on both sides of the stairs and use them. Fix handrails that are broken or loose. Make sure that handrails are as long as the stairways. Check any carpeting to make sure that it is firmly attached to the stairs. Fix any carpet that is loose or worn. Avoid having throw rugs at the top or bottom of the stairs. If you do have throw rugs, attach them to the floor with carpet tape. Make sure that you have a light switch at the top of the stairs and the bottom of the stairs. If you do not have them, ask someone to add them for you. What else can I do to help prevent falls? Wear shoes that: Do not have high heels. Have rubber bottoms. Are comfortable and fit you well. Are closed at the toe. Do not wear sandals. If you use a  stepladder: Make sure that it is fully opened. Do not climb a closed stepladder. Make sure that both sides of the stepladder are locked into place. Ask someone to hold it for you, if possible. Clearly mark and make sure that you can see: Any grab bars or handrails. First and last steps. Where the edge of each step is. Use tools that help you move around (mobility aids) if they are needed. These include: Canes. Walkers. Scooters. Crutches. Turn on the lights when you go into a dark area. Replace any light bulbs as soon as they burn out. Set up your furniture so you have a clear path. Avoid moving your furniture around. If any of your floors are uneven, fix them. If there are any pets around you, be aware of where they are. Review your medicines with your doctor. Some medicines can make you feel dizzy. This can increase your chance of falling. Ask your doctor what other things that you can do to help prevent falls. This information is not intended to replace advice given to you by your health care provider. Make sure you discuss any questions you have with your health care provider. Document Released: 07/03/2009 Document Revised: 02/12/2016 Document Reviewed: 10/11/2014 Elsevier Interactive Patient Education  2017 [employer].

## 2023-02-12 ENCOUNTER — Ambulatory Visit
Admission: RE | Admit: 2023-02-12 | Discharge: 2023-02-12 | Disposition: A | Discharge status: Home / Self Care | Payer: Medicare Other | Source: Ambulatory Visit | Attending: Student | Admitting: Student

## 2023-02-12 DIAGNOSIS — G501 Atypical facial pain: Secondary | ICD-10-CM | POA: Diagnosis not present

## 2023-02-12 DIAGNOSIS — R519 Headache, unspecified: Secondary | ICD-10-CM

## 2023-02-12 DIAGNOSIS — S0232XA Fracture of orbital floor, left side, initial encounter for closed fracture: Secondary | ICD-10-CM | POA: Diagnosis not present

## 2023-02-16 ENCOUNTER — Other Ambulatory Visit: Payer: Self-pay | Admitting: Family Medicine

## 2023-02-17 ENCOUNTER — Other Ambulatory Visit: Payer: Self-pay

## 2023-02-17 MED ORDER — ROSUVASTATIN CALCIUM 40 MG PO TABS: 40.0000 mg | ORAL_TABLET | Freq: Every day | ORAL | 0 refills | Status: DC

## 2023-02-17 NOTE — Telephone Encounter (Signed)
Appt sch'd [id number]

## 2023-03-11 NOTE — Progress Notes (Unsigned)
Sleep Medicine   Office Visit  Patient Name: Carolyn Campbell DOB: Dec 13, 1955 MRN [id number]    Chief Complaint: sleep evaluation  Brief History:  Inetta presents for an initial consult for sleep evaluation and to establish care. Patient has at least 2 years  history of excessive daytime sleepiness and interrupted sleep.  Sleep quality is fair. This is noted most nights. The patient's bed partner reports  snoring at night. The patient relates the following symptoms: some headaches, trouble concentrating, brain fogginess, and fatigue are also present. The patient goes to sleep at 0100 am and wakes up at 0900 am and will wake up several times in between. Patient reports sometimes having trouble returning to sleep. Sleep quality is the same when outside home environment.  Patient has noted has significant movement of her legs at night that would disrupt her sleep.  The patient  relates sleep screaming, nightmares, sleep talking, and some sleep paralysis unusual behavior during the night.  The patient relates depression as a history of psychiatric problems. The Epworth Sleepiness Score is 11 out of 24 .  The patient relates  Cardiovascular risk factors include: hypertension.  The patient reports that she is being seen by a neurologist for REM behavior disorder. She has been treated with melatonin and clonazepam but did not tolerate either.     ROS  General: (-) fever, (-) chills, (-) night sweat Nose and Sinuses: (-) nasal stuffiness or itchiness, (-) postnasal drip, (-) nosebleeds, (-) sinus trouble. Mouth and Throat: (-) sore throat, (-) hoarseness. Neck: (-) swollen glands, (-) enlarged thyroid, (-) neck pain. Respiratory: - cough, - shortness of breath, - wheezing. Neurologic: - numbness, - tingling. Psychiatric: - anxiety, - depression Sleep behavior: -sleep paralysis +hypnogogic hallucinations +dream enactment      +vivid dreams -cataplexy -night terrors -sleep walking   Current  Medication: Outpatient Encounter Medications as of 03/14/2023  Medication Sig   albuterol (VENTOLIN HFA) 108 (90 Base) MCG/ACT inhaler Inhale 2 puffs into the lungs every 6 (six) hours as needed for wheezing or shortness of breath (chest tightness).   apixaban (ELIQUIS) 5 MG TABS tablet Take 1 tablet (5 mg total) by mouth 2 (two) times daily.   brimonidine (ALPHAGAN) 0.2 % ophthalmic solution Place 1 drop into both eyes in the morning and at bedtime.   latanoprost (XALATAN) 0.005 % ophthalmic solution Place 1 drop into both eyes at bedtime.    levothyroxine (SYNTHROID) 75 MCG tablet Take 1 tablet (75 mcg total) by mouth daily before breakfast.   loratadine (CLARITIN) 10 MG tablet Take 1 tablet (10 mg total) by mouth daily.   losartan-hydrochlorothiazide (HYZAAR) 50-12.5 MG tablet Take 1 tablet by mouth daily.   mometasone (NASONEX) 50 MCG/ACT nasal spray Place 2 sprays into the nose daily.   Multiple Vitamin (MULTIVITAMIN) tablet Take 1 tablet by mouth daily.   rosuvastatin (CRESTOR) 40 MG tablet Take 1 tablet (40 mg total) by mouth daily.   sertraline (ZOLOFT) 50 MG tablet Take 1 tablet (50 mg total) by mouth daily.   vedolizumab (ENTYVIO) 300 MG injection Induction Entyvio 300mg  at week 0,2, and 6 and then maintenance does every 8 weeks   [DISCONTINUED] hydrocortisone (ANUSOL-HC) 25 MG suppository Place 1 suppository (25 mg total) rectally at bedtime. (Patient not taking: Reported on 01/17/2023)   No facility-administered encounter medications on file as of 03/14/2023.    Surgical History: Past Surgical History:  Procedure Laterality Date   BREAST BIOPSY Left 2006   POS   BREAST  LUMPECTOMY Left 09/20/2004   positive   BREAST SURGERY Left    malignant biopsy   CARDIAC CATHETERIZATION     COLONOSCOPY  2015   COLONOSCOPY WITH PROPOFOL N/A 06/08/2017   Procedure: COLONOSCOPY WITH PROPOFOL;  Surgeon: Toney Reil, MD;  Location: [facility];  Service: Gastroenterology;   Laterality: N/A;   COLONOSCOPY WITH PROPOFOL N/A 03/08/2019   Procedure: COLONOSCOPY WITH PROPOFOL;  Surgeon: Toney Reil, MD;  Location: [facility] ENDOSCOPY;  Service: Gastroenterology;  Laterality: N/A;   COLONOSCOPY WITH PROPOFOL N/A 07/16/2020   Procedure: COLONOSCOPY WITH PROPOFOL;  Surgeon: Toney Reil, MD;  Location: [facility] ENDOSCOPY;  Service: Gastroenterology;  Laterality: N/A;   COLONOSCOPY WITH PROPOFOL N/A 09/30/2022   Procedure: COLONOSCOPY WITH PROPOFOL;  Surgeon: Toney Reil, MD;  Location: [facility];  Service: Gastroenterology;  Laterality: N/A;   ESOPHAGOGASTRODUODENOSCOPY  2015   ESOPHAGOGASTRODUODENOSCOPY (EGD) WITH PROPOFOL N/A 06/08/2017   Procedure: ESOPHAGOGASTRODUODENOSCOPY (EGD) WITH PROPOFOL;  Surgeon: Toney Reil, MD;  Location: [facility];  Service: Gastroenterology;  Laterality: N/A;   ESOPHAGOGASTRODUODENOSCOPY (EGD) WITH PROPOFOL N/A 03/08/2019   Procedure: ESOPHAGOGASTRODUODENOSCOPY (EGD) WITH PROPOFOL;  Surgeon: Toney Reil, MD;  Location: [facility] ENDOSCOPY;  Service: Gastroenterology;  Laterality: N/A;   EYE SURGERY     FINGER SURGERY     Right small finger   FRACTURE SURGERY     GIVENS CAPSULE STUDY  2015   TUBAL LIGATION      Medical History: Past Medical History:  Diagnosis Date   Abdominal pain, epigastric    Acute metabolic encephalopathy 07/22/2022   Allergic rhinitis    Arthritis    Asthma    Breast cancer (HCC) 2006   LT LUMPECTOMY   Cataract    Clotting disorder (HCC)    Cognitive deficits as late effect of cerebrovascular disease    Convulsions, unspecified convulsion type (HCC) 09/24/2022   COPD (chronic obstructive pulmonary disease) (HCC)    COVID-19 virus infection 04/2021   Crohn's disease (HCC) 05/21/2015   FOLLOWED BY GI   Crohn's disease of both small and large intestine with rectal bleeding (HCC) 12/04/2014   Depression    Currently taking zoloft.   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 05/2021 Echo: EF 60-65%, no rwma, nl RV fxn.   History of kidney stones    Hypercholesterolemia 01/18/2007   Hyperlipidemia    Hypertension    Hypothyroidism    Low back pain radiating to left leg 06/17/2021   Nonobstructive CAD (coronary artery disease)    a. 10/2012 Cath: diffuse minor irregs-->med rx; b. 08/2021 Cor CTA: Ca2+ = 55.6 (77th%'ile), LAD 25p, LCX <25p. No signif non-cardiac findings.   Occlusion, cerebral artery    NOS w/infarction   Osteoporosis    Overweight (BMI 25.0-29.9) 07/17/2022   Personal history of radiation therapy 2006   BREAST CA   Pulmonary embolism (HCC)    a.04/2021 CTA Chest: Small filling defects are noted in upper and lower lobe branches of L PA-->acute PE-->eliquis. *PE occurred in context of COVID infxn.   Stroke [facility])    Ulcer     Family History: Non contributory to the present illness  Social History: Social History   Socioeconomic History  Marital status: Married    Spouse name: javis   Number of children: 2   Years of education: Not on file   Highest education level: Some college, no degree  Occupational History   Not on file  Tobacco Use   Smoking status: Never   Smokeless tobacco: Never  Vaping Use   Vaping Use: Never used  Substance and Sexual Activity   Alcohol use: Never   Drug use: Never   Sexual activity: Not Currently    Partners: Male    Birth control/protection: Surgical  Other Topics Concern   Not on file  Social History Narrative   Married. One son and one daughter. She is disabled after she had breast cancer and strokes. 2 caffeinated beverages daily.   Social Determinants of Health   Financial Resource Strain: Low Risk  (02/04/2023)   Overall  Financial Resource Strain (CARDIA)    Difficulty of Paying Living Expenses: Not hard at all  Food Insecurity: No Food Insecurity (02/04/2023)   Hunger Vital Sign    Worried About Running Out of Food in the Last Year: Never true    Ran Out of Food in the Last Year: Never true  Transportation Needs: No Transportation Needs (02/04/2023)   PRAPARE - Administrator, Civil Service (Medical): No    Lack of Transportation (Non-Medical): No  Physical Activity: Insufficiently Active (02/04/2023)   Exercise Vital Sign    Days of Exercise per Week: 3 days    Minutes of Exercise per Session: 30 min  Stress: No Stress Concern Present (02/04/2023)   [employer] of Occupational Health - Occupational Stress Questionnaire    Feeling of Stress : Not at all  Social Connections: Socially Integrated (02/04/2023)   Social Connection and Isolation Panel [NHANES]    Frequency of Communication with Friends and Family: More than three times a week    Frequency of Social Gatherings with Friends and Family: Never    Attends Religious Services: More than 4 times per year    Active Member of [employer] or Organizations: Yes    Attends Banker Meetings: 1 to 4 times per year    Marital Status: Married  Catering manager Violence: Not At Risk (02/04/2023)   Humiliation, Afraid, Rape, and Kick questionnaire    Fear of Current or Ex-Partner: No    Emotionally Abused: No    Physically Abused: No    Sexually Abused: No    Vital Signs: Blood pressure (!) 140/66, pulse 68, resp. rate 16, height 5' (1.524 m), weight 143 lb (64.9 kg), SpO2 97 %. Body mass index is 27.93 kg/m.   Examination: General Appearance: The patient is well-developed, well-nourished, and in no distress. Neck Circumference: 34 cm Skin: Gross inspection of skin unremarkable. Head: normocephalic, no gross deformities. Eyes: no gross deformities noted. ENT: ears appear grossly normal Neurologic: Alert and oriented. No  involuntary movements.    STOP BANG RISK ASSESSMENT S (snore) Have you been told that you snore?     YES   T (tired) Are you often tired, fatigued, or sleepy during the day?   YES  O (obstruction) Do you stop breathing, choke, or gasp during sleep? NO   P (pressure) Do you have or are you being treated for high blood pressure? YES   B (BMI) Is your body index greater than 35 kg/m? NO   A (age) Are you 61 years old or older? YES   N (neck) Do you have a neck circumference  greater than 16 inches?   NO   G (gender) Are you a female? NO   TOTAL STOP/BANG "YES" ANSWERS 4                                                               A STOP-Bang score of 2 or less is considered low risk, and a score of 5 or more is high risk for having either moderate or severe OSA. For people who score 3 or 4, doctors may need to perform further assessment to determine how likely they are to have OSA.         EPWORTH SLEEPINESS SCALE:  Scale:  (0)= no chance of dozing; (1)= slight chance of dozing; (2)= moderate chance of dozing; (3)= high chance of dozing  Chance  Situtation    Sitting and reading: 1    Watching TV: 1    Sitting Inactive in public: 1    As a passenger in car: 3      Lying down to rest: 3    Sitting and talking: 0    Sitting quielty after lunch: 1    In a car, stopped in traffic: 1   TOTAL SCORE:   11 out of 24    SLEEP STUDIES:  None   LABS: Recent Results (from the past 2160 hour(s))  Calprotectin, Fecal     Status: None   Collection Time: 12/16/22 11:30 AM   Specimen: Stool  Result Value Ref Range   Calprotectin, Fecal 37 0 - 120 ug/g    Comment: Concentration     Interpretation   Follow-Up < 5 - 50 ug/g     Normal           None >50 -120 ug/g     Borderline       Re-evaluate in 4-6 weeks     >120 ug/g     Abnormal         Repeat as clinically                                    indicated     Radiology: MR FACE/TRIGEMINAL WO CM  Result Date:  02/12/2023 CLINICAL DATA:  67 year old female with 2 months of right side facial pain, pain radiating from the head to the neck. No known injury or prior surgery. EXAM: MRI FACE TRIGEMINAL WITHOUT CONTRAST TECHNIQUE: Multiplanar, multi-echo pulse sequences of the face and surrounding structures, including thin-slice imaging of the trigeminal nerves, were acquired without intravenous contrast administration. COMPARISON:  Brain MRI 07/19/2022 and earlier. FINDINGS: Limited intracranial/Trigeminal nerves: Grossly stable visible supratentorial brain parenchyma. No evidence of intracranial mass effect or ventriculomegaly. Normal basilar cisterns. No brainstem signal abnormality identified. Normal noncontrast appearance of the bilateral cisternal fifth nerves (series 7, image 9), bilateral Meckel's cave, bilateral cavernous sinus, bilateral V2 trunks (series 7, image 18), bilateral infraorbital nerves, bilateral V3 trunks (series 10, image 28), bilateral inferior alveolar nerves, and also generally the intraorbital soft tissues. However, there is a chronic small left lamina papyracea fracture. Vascular: Major vascular flow voids at the skull base appear preserved on coronal T2 and axial STIR imaging. Sinuses/Orbits: Chronic postoperative changes to both globes. Small chronic left lamina  papyracea fracture. No significant paranasal sinus disease. No sinus fluid levels. Soft tissues: Visible larynx, pharynx, parapharyngeal spaces, retropharyngeal space, sublingual space, submandibular spaces, masticator and parotid spaces appear normal. No active soft tissue inflammation identified. No upper cervical lymphadenopathy. Visualized scalp soft tissues are within normal limits. Osseous: Visualized bone marrow signal is within normal limits. No skull base abnormality identified. Small chronic left lamina papyracea fracture. Negative for age visible cervical spine. Other: Mastoids are clear. Visible internal auditory structures  appear normal. IMPRESSION: 1. Normal noncontrast trigeminal nerve imaging. No explanation for face or upper neck pain. 2. Small chronic left lamina papyracea fracture. Electronically Signed   By: Odessa Fleming M.D.   On: 02/12/2023 09:05    No results found.  No results found.    Assessment and Plan: Patient Active Problem List   Diagnosis Date Noted   REM behavioral disorder 03/14/2023   Excessive daytime sleepiness 03/14/2023   Crohn's disease of large intestine without complication (HCC) 09/30/2022   Convulsions, unspecified convulsion type (HCC) 09/24/2022   Encounter for current long-term use of anticoagulants 09/24/2022   MDD (major depressive disorder), recurrent episode, mild (HCC) 08/18/2022   Restless leg syndrome 08/18/2022   Pulmonary embolism (HCC) 07/16/2022   Pain in limb 11/10/2021   Nonobstructive CAD (coronary artery disease) 08/06/2021   Other long term (current) drug therapy 01/22/2021   Varicose veins of both lower extremities with pain 07/19/2018   Arthritis of right hand 12/21/2017   Major depression in remission (HCC) 12/21/2017   Fibrocystic breast changes 06/22/2017   Osteopenia 10/18/2016   Atherosclerosis of aorta (HCC) 12/19/2015   Vitreous degeneration 05/21/2015   Glaucoma associated with chamber angle anomalies 05/21/2015   H/O malignant neoplasm of breast 05/21/2015   Crohn's disease of both small and large intestine with rectal bleeding (HCC) 12/04/2014   Solitary pulmonary nodule 11/07/2012   Cognitive deficits as late effect of cerebrovascular disease 01/20/2010   CVA, old, hemiparesis (HCC) 04/28/2009   Acquired hypothyroidism 06/12/2008   HLD (hyperlipidemia) 01/18/2007   Primary hypertension 01/18/2007    1. REM behavioral disorder She is being seen and treated by neurology for this. They have requested PSG be done.   2. Excessive daytime sleepiness   Patient evaluation suggests high risk of sleep disordered breathing due to snoring,  morning headaches and excessive daytime sleepiness. Patient has comorbid cardiovascular risk factors including: hypertension which could be exacerbated by pathologic sleep-disordered breathing.  Suggest: PSG to assess/treat the patient's sleep disordered breathing. The patient was also counselled on weight loss to optimize sleep health.   General Counseling: I have discussed the findings of the evaluation and examination with Marliss.  I have also discussed any further diagnostic evaluation thatmay be needed or ordered today. Nastassia verbalizes understanding of the findings of todays visit. We also reviewed her medications today and discussed drug interactions and side effects including but not limited excessive drowsiness and altered mental states. We also discussed that there is always a risk not just to her but also people around her. she has been encouraged to call the office with any questions or concerns that should arise related to todays visit.  No orders of the defined types were placed in this encounter.       I have personally obtained a history, evaluated the patient, evaluated pertinent data, formulated the assessment and plan and placed orders.  This patient was seen today by Emmaline Kluver, PA-C in collaboration with Dr. Freda Munro.    Paityn Balsam A  Humphrey Rolls, MD [facility] Diplomate ABMS Pulmonary and Critical Care Medicine Sleep medicine

## 2023-03-14 ENCOUNTER — Ambulatory Visit (INDEPENDENT_AMBULATORY_CARE_PROVIDER_SITE_OTHER): Payer: Medicare Other | Admitting: Internal Medicine

## 2023-03-14 VITALS — BP 140/66 | HR 68 | Resp 16 | Ht 60.0 in | Wt 143.0 lb

## 2023-03-14 DIAGNOSIS — G4752 REM sleep behavior disorder: Secondary | ICD-10-CM | POA: Diagnosis not present

## 2023-03-14 DIAGNOSIS — H02834 Dermatochalasis of left upper eyelid: Secondary | ICD-10-CM | POA: Diagnosis not present

## 2023-03-14 DIAGNOSIS — H35371 Puckering of macula, right eye: Secondary | ICD-10-CM | POA: Diagnosis not present

## 2023-03-14 DIAGNOSIS — H26492 Other secondary cataract, left eye: Secondary | ICD-10-CM | POA: Diagnosis not present

## 2023-03-14 DIAGNOSIS — H401132 Primary open-angle glaucoma, bilateral, moderate stage: Secondary | ICD-10-CM | POA: Diagnosis not present

## 2023-03-14 DIAGNOSIS — H04123 Dry eye syndrome of bilateral lacrimal glands: Secondary | ICD-10-CM | POA: Diagnosis not present

## 2023-03-14 DIAGNOSIS — G4719 Other hypersomnia: Secondary | ICD-10-CM | POA: Diagnosis not present

## 2023-03-14 DIAGNOSIS — H43813 Vitreous degeneration, bilateral: Secondary | ICD-10-CM | POA: Diagnosis not present

## 2023-03-14 DIAGNOSIS — H26491 Other secondary cataract, right eye: Secondary | ICD-10-CM | POA: Diagnosis not present

## 2023-03-14 DIAGNOSIS — H02831 Dermatochalasis of right upper eyelid: Secondary | ICD-10-CM | POA: Diagnosis not present

## 2023-03-14 DIAGNOSIS — Z961 Presence of intraocular lens: Secondary | ICD-10-CM | POA: Diagnosis not present

## 2023-03-15 ENCOUNTER — Ambulatory Visit
Admission: RE | Admit: 2023-03-15 | Discharge: 2023-03-15 | Disposition: A | Discharge status: Home / Self Care | Payer: Medicare Other | Source: Ambulatory Visit | Attending: Gastroenterology | Admitting: Gastroenterology

## 2023-03-15 DIAGNOSIS — K5090 Crohn's disease, unspecified, without complications: Secondary | ICD-10-CM | POA: Insufficient documentation

## 2023-03-15 MED ORDER — VEDOLIZUMAB 300 MG IV SOLR
300.0000 mg | Freq: Once | INTRAVENOUS | Status: AC
  Administered 2023-03-15: 300 mg via INTRAVENOUS
  Filled 2023-03-15: qty 5

## 2023-03-16 ENCOUNTER — Ambulatory Visit: Payer: Medicare Other | Admitting: Family Medicine

## 2023-03-16 DIAGNOSIS — M545 Low back pain, unspecified: Secondary | ICD-10-CM | POA: Diagnosis not present

## 2023-03-16 DIAGNOSIS — H9313 Tinnitus, bilateral: Secondary | ICD-10-CM | POA: Diagnosis not present

## 2023-03-16 DIAGNOSIS — G4733 Obstructive sleep apnea (adult) (pediatric): Secondary | ICD-10-CM | POA: Diagnosis not present

## 2023-03-16 DIAGNOSIS — R519 Headache, unspecified: Secondary | ICD-10-CM | POA: Diagnosis not present

## 2023-03-16 DIAGNOSIS — G4752 REM sleep behavior disorder: Secondary | ICD-10-CM | POA: Diagnosis not present

## 2023-03-16 DIAGNOSIS — G8929 Other chronic pain: Secondary | ICD-10-CM | POA: Diagnosis not present

## 2023-03-21 ENCOUNTER — Ambulatory Visit: Payer: Medicare Other | Admitting: Family Medicine

## 2023-03-27 ENCOUNTER — Other Ambulatory Visit: Payer: Self-pay | Admitting: Family Medicine

## 2023-03-28 ENCOUNTER — Ambulatory Visit (INDEPENDENT_AMBULATORY_CARE_PROVIDER_SITE_OTHER): Payer: Medicare Other | Admitting: Family Medicine

## 2023-03-28 ENCOUNTER — Encounter: Payer: Self-pay | Admitting: Family Medicine

## 2023-03-28 VITALS — BP 142/70 | HR 88 | Temp 97.9°F | Resp 16 | Ht 60.0 in | Wt 144.7 lb

## 2023-03-28 DIAGNOSIS — D649 Anemia, unspecified: Secondary | ICD-10-CM

## 2023-03-28 DIAGNOSIS — F330 Major depressive disorder, recurrent, mild: Secondary | ICD-10-CM

## 2023-03-28 DIAGNOSIS — Z5181 Encounter for therapeutic drug level monitoring: Secondary | ICD-10-CM | POA: Diagnosis not present

## 2023-03-28 DIAGNOSIS — Z7901 Long term (current) use of anticoagulants: Secondary | ICD-10-CM

## 2023-03-28 DIAGNOSIS — E782 Mixed hyperlipidemia: Secondary | ICD-10-CM

## 2023-03-28 DIAGNOSIS — E039 Hypothyroidism, unspecified: Secondary | ICD-10-CM | POA: Diagnosis not present

## 2023-03-28 DIAGNOSIS — I10 Essential (primary) hypertension: Secondary | ICD-10-CM

## 2023-03-28 NOTE — Progress Notes (Signed)
Name: Carolyn Campbell   MRN: [id number]    DOB: June 15, 1956   Date:03/28/2023       Progress Note  Chief Complaint  Patient presents with   Follow-up   Hypothyroidism   Hypertension   Hyperlipidemia   Depression     Subjective:   Carolyn Campbell is a 67 y.o. female, presents to clinic for routine f/up on chronic conditions  Due for TSH to be rechecked Dose changed earlier this year Lab Results  Component Value Date   TSH 0.64 12/10/2022   HTN BP high today - losartan-hydrochlorothiazide 50-12.5 BP Readings from Last 3 Encounters:  03/28/23 (!) 150/74  03/14/23 (!) 140/66  02/04/23 126/78   Hx of CAD, HLD, CVA on crestor - labs due Lab Results  Component Value Date   CHOL 193 01/29/2021   HDL 89 01/29/2021   LDLCALC 85 01/29/2021   TRIG 91 01/29/2021   CHOLHDL 2.2 01/29/2021   She's seeing multiple specialists right now for facial pain, HA's, after PE, dizziness, new psychiatry and meds, she is frustrated to come in for routine ov here as well. Explained there is minimum routine f/ups and labs that we need to do about 2x a year to help keep meds and chronic conditions controlled and monitored - [facility] for preventative medicine and screenign and then acute visits separate in order to get all the appropriate med care she needs         Current Outpatient Medications:    albuterol (VENTOLIN HFA) 108 (90 Base) MCG/ACT inhaler, Inhale 2 puffs into the lungs every 6 (six) hours as needed for wheezing or shortness of breath (chest tightness)., Disp: 8 g, Rfl: 2   brimonidine (ALPHAGAN) 0.2 % ophthalmic solution, Place 1 drop into both eyes in the morning and at bedtime., Disp: , Rfl:    latanoprost (XALATAN) 0.005 % ophthalmic solution, Place 1 drop into both eyes at bedtime. , Disp: , Rfl:    levothyroxine (SYNTHROID) 75 MCG tablet, Take 1 tablet (75 mcg total) by mouth daily before breakfast., Disp: 90 tablet, Rfl: 3   losartan-hydrochlorothiazide (HYZAAR) 50-12.5 MG tablet,  Take 1 tablet by mouth daily., Disp: 90 tablet, Rfl: 2   Multiple Vitamin (MULTIVITAMIN) tablet, Take 1 tablet by mouth daily., Disp: , Rfl:    rosuvastatin (CRESTOR) 40 MG tablet, Take 1 tablet (40 mg total) by mouth daily., Disp: 90 tablet, Rfl: 0   sertraline (ZOLOFT) 50 MG tablet, Take 1 tablet by mouth once daily, Disp: 30 tablet, Rfl: 0   tiZANidine (ZANAFLEX) 2 MG tablet, Take 2 mg by mouth 3 (three) times daily., Disp: , Rfl:    triamcinolone (KENALOG) 0.025 % cream, 1 application Externally Once a day, Disp: , Rfl:    valACYclovir (VALTREX) 500 MG tablet, 1 tablet Orally Once a day for 10 day(s), Disp: , Rfl:    vedolizumab (ENTYVIO) 300 MG injection, Induction Entyvio 300mg  at week 0,2, and 6 and then maintenance does every 8 weeks, Disp: 1 each, Rfl: 12   apixaban (ELIQUIS) 5 MG TABS tablet, Take 1 tablet (5 mg total) by mouth 2 (two) times daily., Disp: 60 tablet, Rfl: 0   loratadine (CLARITIN) 10 MG tablet, Take 1 tablet (10 mg total) by mouth daily. (Patient not taking: Reported on 03/28/2023), Disp: 30 tablet, Rfl: 11   mometasone (NASONEX) 50 MCG/ACT nasal spray, Place 2 sprays into the nose daily. (Patient not taking: Reported on 03/28/2023), Disp: 1 each, Rfl: 12  Patient Active Problem  List   Diagnosis Date Noted   REM behavioral disorder 03/14/2023   Excessive daytime sleepiness 03/14/2023   Crohn's disease of large intestine without complication (HCC) 09/30/2022   Convulsions, unspecified convulsion type (HCC) 09/24/2022   Encounter for current long-term use of anticoagulants 09/24/2022   MDD (major depressive disorder), recurrent episode, mild (HCC) 08/18/2022   Restless leg syndrome 08/18/2022   Pulmonary embolism (HCC) 07/16/2022   Pain in limb 11/10/2021   Nonobstructive CAD (coronary artery disease) 08/06/2021   Other long term (current) drug therapy 01/22/2021   Varicose veins of both lower extremities with pain 07/19/2018   Arthritis of right hand 12/21/2017   Major  depression in remission (HCC) 12/21/2017   Fibrocystic breast changes 06/22/2017   Osteopenia 10/18/2016   Atherosclerosis of aorta (HCC) 12/19/2015   Vitreous degeneration 05/21/2015   Glaucoma associated with chamber angle anomalies 05/21/2015   H/O malignant neoplasm of breast 05/21/2015   Crohn's disease of both small and large intestine with rectal bleeding (HCC) 12/04/2014   Solitary pulmonary nodule 11/07/2012   Cognitive deficits as late effect of cerebrovascular disease 01/20/2010   CVA, old, hemiparesis (HCC) 04/28/2009   Acquired hypothyroidism 06/12/2008   HLD (hyperlipidemia) 01/18/2007   Primary hypertension 01/18/2007    Past Surgical History:  Procedure Laterality Date   BREAST BIOPSY Left 2006   POS   BREAST LUMPECTOMY Left 09/20/2004   positive   BREAST SURGERY Left    malignant biopsy   CARDIAC CATHETERIZATION     COLONOSCOPY  2015   COLONOSCOPY WITH PROPOFOL N/A 06/08/2017   Procedure: COLONOSCOPY WITH PROPOFOL;  Surgeon: Toney Reil, MD;  Location: [facility];  Service: Gastroenterology;  Laterality: N/A;   COLONOSCOPY WITH PROPOFOL N/A 03/08/2019   Procedure: COLONOSCOPY WITH PROPOFOL;  Surgeon: Toney Reil, MD;  Location: [facility];  Service: Gastroenterology;  Laterality: N/A;   COLONOSCOPY WITH PROPOFOL N/A 07/16/2020   Procedure: COLONOSCOPY WITH PROPOFOL;  Surgeon: Toney Reil, MD;  Location: [facility] ENDOSCOPY;  Service: Gastroenterology;  Laterality: N/A;   COLONOSCOPY WITH PROPOFOL N/A 09/30/2022   Procedure: COLONOSCOPY WITH PROPOFOL;  Surgeon: Toney Reil, MD;  Location: [facility] ENDOSCOPY;  Service: Gastroenterology;  Laterality: N/A;   ESOPHAGOGASTRODUODENOSCOPY  2015   ESOPHAGOGASTRODUODENOSCOPY (EGD) WITH PROPOFOL N/A 06/08/2017   Procedure: ESOPHAGOGASTRODUODENOSCOPY (EGD) WITH PROPOFOL;  Surgeon: Toney Reil, MD;  Location: [facility];  Service: Gastroenterology;  Laterality: N/A;    ESOPHAGOGASTRODUODENOSCOPY (EGD) WITH PROPOFOL N/A 03/08/2019   Procedure: ESOPHAGOGASTRODUODENOSCOPY (EGD) WITH PROPOFOL;  Surgeon: Toney Reil, MD;  Location: [facility] ENDOSCOPY;  Service: Gastroenterology;  Laterality: N/A;   EYE SURGERY     FINGER SURGERY     Right small finger   FRACTURE SURGERY     GIVENS CAPSULE STUDY  2015   TUBAL LIGATION      Family History  Problem Relation Age of Onset   Heart disease Mother    Heart attack Mother    Hypertension Mother    Varicose Veins Mother    Cerebrovascular Accident Father    Arthritis Father    Hypertension Father    Dementia Father    Breast cancer Sister    Cancer Sister    Breast cancer Sister 70   Cancer Sister    Cancer Sister    Hypertension Other    Diabetes Other    Heart attack Other 83   Mental illness Neg Hx     Social History   Tobacco Use  Smoking status: Never   Smokeless tobacco: Never  Vaping Use   Vaping Use: Never used  Substance Use Topics   Alcohol use: Never   Drug use: Never     No Known Allergies  Health Maintenance  Topic Date Due   COVID-19 Vaccine (8 - 2023-24 season) 04/13/2023 (Originally 09/02/2022)   INFLUENZA VACCINE  04/21/2023   MAMMOGRAM  06/25/2023   Medicare Annual Wellness (AWV)  02/04/2024   DEXA SCAN  01/30/2025   Colonoscopy  10/01/2027   Pneumonia Vaccine 26+ Years old  Completed   Hepatitis C Screening  Completed   Zoster Vaccines- Shingrix  Completed   HPV VACCINES  Aged Out   DTaP/Tdap/Td  Discontinued    Chart Review Today: I personally reviewed active problem list, medication list, allergies, family history, social history, health maintenance, notes from last encounter, lab results, imaging with the patient/caregiver today.    Review of Systems  Constitutional: Negative.   HENT: Negative.    Eyes: Negative.   Respiratory: Negative.    Cardiovascular: Negative.   Gastrointestinal: Negative.   Endocrine: Negative.   Genitourinary: Negative.    Musculoskeletal: Negative.   Skin: Negative.   Allergic/Immunologic: Negative.   Neurological: Negative.   Hematological: Negative.   Psychiatric/Behavioral: Negative.    All other systems reviewed and are negative.    Objective:   Vitals:   03/28/23 1431  BP: (!) 150/74  Pulse: 88  Resp: 16  Temp: 97.9 F (36.6 C)  TempSrc: Oral  SpO2: 97%  Weight: 144 lb 11.2 oz (65.6 kg)  Height: 5' (1.524 m)    Body mass index is 28.26 kg/m.  Physical Exam Vitals and nursing note reviewed.  Constitutional:      General: She is not in acute distress.    Appearance: Normal appearance. She is well-developed and well-groomed. She is not ill-appearing, toxic-appearing or diaphoretic.  HENT:     Head: Normocephalic and atraumatic.     Nose: Nose normal.  Eyes:     General:        Right eye: No discharge.        Left eye: No discharge.     Conjunctiva/sclera: Conjunctivae normal.  Neck:     Trachea: No tracheal deviation.  Cardiovascular:     Rate and Rhythm: Normal rate and regular rhythm.  Pulmonary:     Effort: Pulmonary effort is normal. No respiratory distress.     Breath sounds: No stridor.  Musculoskeletal:        General: Normal range of motion.  Skin:    General: Skin is warm and dry.     Findings: No rash.  Neurological:     Mental Status: She is alert.     Motor: No abnormal muscle tone.     Coordination: Coordination normal.  Psychiatric:        Attention and Perception: Attention normal.        Mood and Affect: Mood and affect normal.        Behavior: Behavior normal. Behavior is cooperative.     Comments: Appears tired         Assessment & Plan:   Problem List Items Addressed This Visit       Cardiovascular and Mediastinum   Primary hypertension    BP not at goal today but she is in pain and expresses some annoyance and having to be here for the appt today On losartan-HCtZ, previously BP improved and was at goal on this med BP Readings  from Last  3 Encounters:  03/28/23 (!) 142/70  03/14/23 (!) 140/66  02/04/23 126/78         Relevant Orders   COMPLETE METABOLIC PANEL WITH GFR (Completed)   CBC with Differential/Platelet (Completed)     Endocrine   Acquired hypothyroidism - Primary    Fatigued, dose recently adjusted, on daily levothyroxine Recheck labs today       Relevant Orders   TSH (Completed)   CBC with Differential/Platelet (Completed)     Other   HLD (hyperlipidemia)    On crestor 40 mg, overdue for recheck of labs, last was 2022 Hx of nonobstructive CAD and hx of CVA - aggressive lipid goals Recheck today      Relevant Orders   COMPLETE METABOLIC PANEL WITH GFR (Completed)   Lipid panel (Completed)   CBC with Differential/Platelet (Completed)   MDD (major depressive disorder), recurrent episode, mild (HCC)    Working with psychiatry and on medications    03/28/2023    2:26 PM 02/04/2023    1:03 PM 12/10/2022   11:10 AM  Depression screen PHQ 2/9  Decreased Interest 0 0 0  Down, Depressed, Hopeless 0 0 0  PHQ - 2 Score 0 0 0  Altered sleeping 0  0  Tired, decreased energy 0  0  Change in appetite 0  0  Feeling bad or failure about yourself  0  0  Trouble concentrating 0  0  Moving slowly or fidgety/restless 0  0  Suicidal thoughts 0  0  PHQ-9 Score 0  0  Difficult doing work/chores Not difficult at all Not difficult at all Not difficult at all         Relevant Orders   TSH (Completed)   Encounter for current long-term use of anticoagulants   Relevant Orders   COMPLETE METABOLIC PANEL WITH GFR (Completed)   CBC with Differential/Platelet (Completed)   Other Visit Diagnoses     Encounter for medication monitoring       Relevant Orders   COMPLETE METABOLIC PANEL WITH GFR (Completed)   Lipid panel (Completed)   TSH (Completed)   CBC with Differential/Platelet (Completed)   Anemia, unspecified type       monitoring labs   Relevant Orders   CBC with Differential/Platelet  (Completed)        Return in about 6 months (around 09/28/2023) for Routine follow-up.   Danelle Berry, PA-C 03/28/23 2:44 PM

## 2023-03-28 NOTE — Telephone Encounter (Signed)
Pt needs f/u appt

## 2023-03-29 LAB — COMPLETE METABOLIC PANEL WITH GFR
AG Ratio: 1.6 (calc) (ref 1.0–2.5)
ALT: 13 U/L (ref 6–29)
AST: 16 U/L (ref 10–35)
Albumin: 4.3 g/dL (ref 3.6–5.1)
Alkaline phosphatase (APISO): 69 U/L (ref 37–153)
BUN: 13 mg/dL (ref 7–25)
CO2: 29 mmol/L (ref 20–32)
Calcium: 10.1 mg/dL (ref 8.6–10.4)
Chloride: 105 mmol/L (ref 98–110)
Creat: 0.73 mg/dL (ref 0.50–1.05)
Globulin: 2.7 g/dL (calc) (ref 1.9–3.7)
Glucose, Bld: 103 mg/dL (ref 65–139)
Potassium: 3.6 mmol/L (ref 3.5–5.3)
Sodium: 142 mmol/L (ref 135–146)
Total Bilirubin: 0.6 mg/dL (ref 0.2–1.2)
Total Protein: 7 g/dL (ref 6.1–8.1)
eGFR: 89 mL/min/{1.73_m2} (ref >=60)

## 2023-03-29 LAB — CBC WITH DIFFERENTIAL/PLATELET
Absolute Monocytes: 400 cells/uL (ref 200–950)
Basophils Absolute: 40 cells/uL (ref 0–200)
Basophils Relative: 0.8 %
Eosinophils Absolute: 90 cells/uL (ref 15–500)
Eosinophils Relative: 1.8 %
HCT: 37.1 % (ref 35.0–45.0)
Hemoglobin: 12.2 g/dL (ref 11.7–15.5)
Lymphs Abs: 1910 cells/uL (ref 850–3900)
MCH: 28.8 pg (ref 27.0–33.0)
MCHC: 32.9 g/dL (ref 32.0–36.0)
MCV: 87.5 fL (ref 80.0–100.0)
MPV: 11.6 fL (ref 7.5–12.5)
Monocytes Relative: 8 %
Neutro Abs: 2560 cells/uL (ref 1500–7800)
Neutrophils Relative %: 51.2 %
Platelets: 205 10*3/uL (ref 140–400)
RBC: 4.24 10*6/uL (ref 3.80–5.10)
RDW: 12.8 % (ref 11.0–15.0)
Total Lymphocyte: 38.2 %
WBC: 5 10*3/uL (ref 3.8–10.8)

## 2023-03-29 LAB — LIPID PANEL
Cholesterol: 200 mg/dL — ABNORMAL HIGH (ref <200)
HDL: 98 mg/dL (ref >=50)
LDL Cholesterol (Calc): 83 mg/dL (calc)
Non-HDL Cholesterol (Calc): 102 mg/dL (calc) (ref <130)
Total CHOL/HDL Ratio: 2 (calc) (ref <5.0)
Triglycerides: 98 mg/dL (ref <150)

## 2023-03-29 LAB — TSH: TSH: 0.12 mIU/L — ABNORMAL LOW (ref 0.40–4.50)

## 2023-03-30 ENCOUNTER — Other Ambulatory Visit: Payer: Self-pay | Admitting: Family Medicine

## 2023-03-30 DIAGNOSIS — E039 Hypothyroidism, unspecified: Secondary | ICD-10-CM

## 2023-03-30 MED ORDER — ROSUVASTATIN CALCIUM 40 MG PO TABS: 40.0000 mg | ORAL_TABLET | Freq: Every day | ORAL | 3 refills | Status: DC

## 2023-03-30 MED ORDER — LEVOTHYROXINE SODIUM 75 MCG PO TABS: ORAL_TABLET | ORAL | 1 refills | Status: DC

## 2023-04-01 ENCOUNTER — Encounter: Payer: Self-pay | Admitting: Family Medicine

## 2023-04-01 NOTE — Assessment & Plan Note (Signed)
BP not at goal today but she is in pain and expresses some annoyance and having to be here for the appt today On losartan-HCtZ, previously BP improved and was at goal on this med BP Readings from Last 3 Encounters:  03/28/23 (!) 142/70  03/14/23 (!) 140/66  02/04/23 126/78

## 2023-04-01 NOTE — Assessment & Plan Note (Signed)
Working with psychiatry and on medications    03/28/2023    2:26 PM 02/04/2023    1:03 PM 12/10/2022   11:10 AM  Depression screen PHQ 2/9  Decreased Interest 0 0 0  Down, Depressed, Hopeless 0 0 0  PHQ - 2 Score 0 0 0  Altered sleeping 0  0  Tired, decreased energy 0  0  Change in appetite 0  0  Feeling bad or failure about yourself  0  0  Trouble concentrating 0  0  Moving slowly or fidgety/restless 0  0  Suicidal thoughts 0  0  PHQ-9 Score 0  0  Difficult doing work/chores Not difficult at all Not difficult at all Not difficult at all

## 2023-04-01 NOTE — Assessment & Plan Note (Signed)
Fatigued, dose recently adjusted, on daily levothyroxine Recheck labs today

## 2023-04-01 NOTE — Assessment & Plan Note (Signed)
On crestor 40 mg, overdue for recheck of labs, last was 2022 Hx of nonobstructive CAD and hx of CVA - aggressive lipid goals Recheck today

## 2023-04-04 DIAGNOSIS — H02422 Myogenic ptosis of left eyelid: Secondary | ICD-10-CM | POA: Diagnosis not present

## 2023-04-04 DIAGNOSIS — H02834 Dermatochalasis of left upper eyelid: Secondary | ICD-10-CM | POA: Diagnosis not present

## 2023-04-04 DIAGNOSIS — H53483 Generalized contraction of visual field, bilateral: Secondary | ICD-10-CM | POA: Diagnosis not present

## 2023-04-04 DIAGNOSIS — H02831 Dermatochalasis of right upper eyelid: Secondary | ICD-10-CM | POA: Diagnosis not present

## 2023-04-04 DIAGNOSIS — H02421 Myogenic ptosis of right eyelid: Secondary | ICD-10-CM | POA: Diagnosis not present

## 2023-04-04 DIAGNOSIS — H57813 Brow ptosis, bilateral: Secondary | ICD-10-CM | POA: Diagnosis not present

## 2023-04-04 DIAGNOSIS — H02413 Mechanical ptosis of bilateral eyelids: Secondary | ICD-10-CM | POA: Diagnosis not present

## 2023-04-04 DIAGNOSIS — H0279 Other degenerative disorders of eyelid and periocular area: Secondary | ICD-10-CM | POA: Diagnosis not present

## 2023-04-04 DIAGNOSIS — H02423 Myogenic ptosis of bilateral eyelids: Secondary | ICD-10-CM | POA: Diagnosis not present

## 2023-04-05 DIAGNOSIS — H9201 Otalgia, right ear: Secondary | ICD-10-CM | POA: Diagnosis not present

## 2023-04-05 DIAGNOSIS — K219 Gastro-esophageal reflux disease without esophagitis: Secondary | ICD-10-CM | POA: Diagnosis not present

## 2023-04-06 ENCOUNTER — Other Ambulatory Visit: Payer: Self-pay | Admitting: Student

## 2023-04-06 DIAGNOSIS — H9201 Otalgia, right ear: Secondary | ICD-10-CM

## 2023-04-11 ENCOUNTER — Other Ambulatory Visit: Payer: Medicare Other

## 2023-04-12 ENCOUNTER — Encounter: Payer: Self-pay | Admitting: Family Medicine

## 2023-04-13 DIAGNOSIS — H53483 Generalized contraction of visual field, bilateral: Secondary | ICD-10-CM | POA: Diagnosis not present

## 2023-04-14 ENCOUNTER — Ambulatory Visit
Admission: RE | Admit: 2023-04-14 | Discharge: 2023-04-14 | Disposition: A | Discharge status: Home / Self Care | Payer: Medicare Other | Source: Ambulatory Visit | Attending: Student | Admitting: Student

## 2023-04-14 DIAGNOSIS — R9389 Abnormal findings on diagnostic imaging of other specified body structures: Secondary | ICD-10-CM | POA: Diagnosis not present

## 2023-04-14 DIAGNOSIS — H9201 Otalgia, right ear: Secondary | ICD-10-CM | POA: Diagnosis not present

## 2023-04-14 DIAGNOSIS — M542 Cervicalgia: Secondary | ICD-10-CM | POA: Diagnosis not present

## 2023-04-14 MED ORDER — IOPAMIDOL (ISOVUE-300) INJECTION 61%
75.0000 mL | Freq: Once | INTRAVENOUS | Status: AC | PRN
  Administered 2023-04-14: 75 mL via INTRAVENOUS

## 2023-05-01 ENCOUNTER — Other Ambulatory Visit: Payer: Self-pay | Admitting: Family Medicine

## 2023-05-02 ENCOUNTER — Other Ambulatory Visit: Payer: Self-pay

## 2023-05-04 DIAGNOSIS — G4733 Obstructive sleep apnea (adult) (pediatric): Secondary | ICD-10-CM | POA: Diagnosis not present

## 2023-05-09 ENCOUNTER — Ambulatory Visit (INDEPENDENT_AMBULATORY_CARE_PROVIDER_SITE_OTHER): Payer: Medicare Other | Admitting: Family Medicine

## 2023-05-09 ENCOUNTER — Encounter: Payer: Self-pay | Admitting: Family Medicine

## 2023-05-09 VITALS — BP 138/72 | HR 78 | Temp 98.1°F | Resp 16 | Ht 60.0 in | Wt 146.7 lb

## 2023-05-09 DIAGNOSIS — Z5181 Encounter for therapeutic drug level monitoring: Secondary | ICD-10-CM

## 2023-05-09 DIAGNOSIS — E039 Hypothyroidism, unspecified: Secondary | ICD-10-CM

## 2023-05-09 NOTE — Progress Notes (Signed)
Patient ID: Carolyn Campbell, female    DOB: [birth year], 67 y.o.   MRN: [id number]  PCP: Danelle Berry, PA-C  Chief Complaint  Patient presents with   Follow-up   Hypothyroidism    Subjective:   Carolyn Campbell is a 67 y.o. female, presents to clinic with CC of the following:  HPI  Here for 6 week thyroid f/up, TSH was suppressed with last labs Dose was reduced from 75 mcg daily to 75 mcg 5 d a week and 1/2 tab 2 days a week - she feels fine, no SE or concerns after changing the dose Lab Results  Component Value Date   TSH 0.12 (L) 03/28/2023   Current Symptoms: denies fatigue, weight changes, heat/cold intolerance, bowel/skin changes or CVS symptoms Most recent results are below; we will be repeating labs today.  Nerve pain - managing with neurology- Dr. Sherryll Burger, she cancelled a procedure and asked about other medication options   Patient Active Problem List   Diagnosis Date Noted   REM behavioral disorder 03/14/2023   Excessive daytime sleepiness 03/14/2023   Crohn's disease of large intestine without complication (HCC) 09/30/2022   Convulsions, unspecified convulsion type (HCC) 09/24/2022   Encounter for current long-term use of anticoagulants 09/24/2022   MDD (major depressive disorder), recurrent episode, mild (HCC) 08/18/2022   Restless leg syndrome 08/18/2022   Pulmonary embolism (HCC) 07/16/2022   Pain in limb 11/10/2021   Nonobstructive CAD (coronary artery disease) 08/06/2021   Other long term (current) drug therapy 01/22/2021   Varicose veins of both lower extremities with pain 07/19/2018   Arthritis of right hand 12/21/2017   Major depression in remission (HCC) 12/21/2017   Fibrocystic breast changes 06/22/2017   Osteopenia 10/18/2016   Atherosclerosis of aorta (HCC) 12/19/2015   Vitreous degeneration 05/21/2015   Glaucoma associated with chamber angle anomalies 05/21/2015   H/O malignant neoplasm of breast 05/21/2015   Crohn's disease of both small and large  intestine with rectal bleeding (HCC) 12/04/2014   Solitary pulmonary nodule 11/07/2012   Cognitive deficits as late effect of cerebrovascular disease 01/20/2010   CVA, old, hemiparesis (HCC) 04/28/2009   Acquired hypothyroidism 06/12/2008   HLD (hyperlipidemia) 01/18/2007   Primary hypertension 01/18/2007      Current Outpatient Medications:    albuterol (VENTOLIN HFA) 108 (90 Base) MCG/ACT inhaler, Inhale 2 puffs into the lungs every 6 (six) hours as needed for wheezing or shortness of breath (chest tightness)., Disp: 8 g, Rfl: 2   brimonidine (ALPHAGAN) 0.2 % ophthalmic solution, Place 1 drop into both eyes in the morning and at bedtime., Disp: , Rfl:    latanoprost (XALATAN) 0.005 % ophthalmic solution, Place 1 drop into both eyes at bedtime. , Disp: , Rfl:    levothyroxine (SYNTHROID) 75 MCG tablet, Take 1 tablet (75 mcg total) poqam before breakfast 5 d a week and take 1/2 tab (37.5 mcg) poqam  2 d a week, Disp: 78 tablet, Rfl: 1   losartan-hydrochlorothiazide (HYZAAR) 50-12.5 MG tablet, Take 1 tablet by mouth daily., Disp: 90 tablet, Rfl: 2   Multiple Vitamin (MULTIVITAMIN) tablet, Take 1 tablet by mouth daily., Disp: , Rfl:    rosuvastatin (CRESTOR) 40 MG tablet, Take 1 tablet (40 mg total) by mouth daily., Disp: 90 tablet, Rfl: 3   sertraline (ZOLOFT) 50 MG tablet, Take 1 tablet by mouth once daily, Disp: 90 tablet, Rfl: 1   vedolizumab (ENTYVIO) 300 MG injection, Induction Entyvio 300mg  at week 0,2, and 6 and then maintenance  does every 8 weeks, Disp: 1 each, Rfl: 12   apixaban (ELIQUIS) 5 MG TABS tablet, Take 1 tablet (5 mg total) by mouth 2 (two) times daily., Disp: 60 tablet, Rfl: 0   loratadine (CLARITIN) 10 MG tablet, Take 1 tablet (10 mg total) by mouth daily. (Patient not taking: Reported on 03/28/2023), Disp: 30 tablet, Rfl: 11   mometasone (NASONEX) 50 MCG/ACT nasal spray, Place 2 sprays into the nose daily. (Patient not taking: Reported on 03/28/2023), Disp: 1 each, Rfl: 12    tiZANidine (ZANAFLEX) 2 MG tablet, Take 2 mg by mouth 3 (three) times daily., Disp: , Rfl:    triamcinolone (KENALOG) 0.025 % cream, 1 application Externally Once a day, Disp: , Rfl:    valACYclovir (VALTREX) 500 MG tablet, 1 tablet Orally Once a day for 10 day(s), Disp: , Rfl:    No Known Allergies   Social History   Tobacco Use   Smoking status: Never   Smokeless tobacco: Never  Vaping Use   Vaping status: Never Used  Substance Use Topics   Alcohol use: Never   Drug use: Never      Chart Review Today: I personally reviewed active problem list, medication list, allergies, family history, social history, health maintenance, notes from last encounter, lab results, imaging with the patient/caregiver today.   Review of Systems  Constitutional: Negative.   HENT: Negative.    Eyes: Negative.   Respiratory: Negative.    Cardiovascular: Negative.   Gastrointestinal: Negative.   Endocrine: Negative.   Genitourinary: Negative.   Musculoskeletal: Negative.   Skin: Negative.   Allergic/Immunologic: Negative.   Neurological: Negative.   Hematological: Negative.   Psychiatric/Behavioral: Negative.    All other systems reviewed and are negative.      Objective:   Vitals:   05/09/23 1125  BP: 138/72  Pulse: 78  Resp: 16  Temp: 98.1 F (36.7 C)  TempSrc: Oral  SpO2: 95%  Weight: 146 lb 11.2 oz (66.5 kg)  Height: 5' (1.524 m)    Body mass index is 28.65 kg/m.  Physical Exam Vitals and nursing note reviewed.  Constitutional:      General: She is not in acute distress.    Appearance: Normal appearance. She is well-developed. She is not ill-appearing, toxic-appearing or diaphoretic.  HENT:     Head: Normocephalic and atraumatic.     Nose: Nose normal.  Eyes:     General:        Right eye: No discharge.        Left eye: No discharge.     Conjunctiva/sclera: Conjunctivae normal.  Neck:     Trachea: No tracheal deviation.  Cardiovascular:     Rate and Rhythm:  Normal rate.  Pulmonary:     Effort: Pulmonary effort is normal. No respiratory distress.  Musculoskeletal:        General: Normal range of motion.  Skin:    General: Skin is warm and dry.     Findings: No rash.  Neurological:     Mental Status: She is alert.     Motor: No abnormal muscle tone.     Coordination: Coordination normal.  Psychiatric:        Behavior: Behavior normal.      Results for orders placed or performed in visit on 03/28/23  COMPLETE METABOLIC PANEL WITH GFR  Result Value Ref Range   Glucose, Bld 103 65 - 139 mg/dL   BUN 13 7 - 25 mg/dL   Creat 1.61 0.96 -  1.05 mg/dL   eGFR 89 > OR = 60 MW/UXL/2.44W1   BUN/Creatinine Ratio SEE NOTE: 6 - 22 (calc)   Sodium 142 135 - 146 mmol/L   Potassium 3.6 3.5 - 5.3 mmol/L   Chloride 105 98 - 110 mmol/L   CO2 29 20 - 32 mmol/L   Calcium 10.1 8.6 - 10.4 mg/dL   Total Protein 7.0 6.1 - 8.1 g/dL   Albumin 4.3 3.6 - 5.1 g/dL   Globulin 2.7 1.9 - 3.7 g/dL (calc)   AG Ratio 1.6 1.0 - 2.5 (calc)   Total Bilirubin 0.6 0.2 - 1.2 mg/dL   Alkaline phosphatase (APISO) 69 37 - 153 U/L   AST 16 10 - 35 U/L   ALT 13 6 - 29 U/L  Lipid panel  Result Value Ref Range   Cholesterol 200 (H) <200 mg/dL   HDL 98 > OR = 50 mg/dL   Triglycerides 98 <027 mg/dL   LDL Cholesterol (Calc) 83 mg/dL (calc)   Total CHOL/HDL Ratio 2.0 <5.0 (calc)   Non-HDL Cholesterol (Calc) 102 <130 mg/dL (calc)  TSH  Result Value Ref Range   TSH 0.12 (L) 0.40 - 4.50 mIU/L  CBC with Differential/Platelet  Result Value Ref Range   WBC 5.0 3.8 - 10.8 Thousand/uL   RBC 4.24 3.80 - 5.10 Million/uL   Hemoglobin 12.2 11.7 - 15.5 g/dL   HCT 25.3 66.4 - 40.3 %   MCV 87.5 80.0 - 100.0 fL   MCH 28.8 27.0 - 33.0 pg   MCHC 32.9 32.0 - 36.0 g/dL   RDW 47.4 25.9 - 56.3 %   Platelets 205 140 - 400 Thousand/uL   MPV 11.6 7.5 - 12.5 fL   Neutro Abs 2,560 1,500 - 7,800 cells/uL   Lymphs Abs 1,910 850 - 3,900 cells/uL   Absolute Monocytes 400 200 - 950 cells/uL    Eosinophils Absolute 90 15 - 500 cells/uL   Basophils Absolute 40 0 - 200 cells/uL   Neutrophils Relative % 51.2 %   Total Lymphocyte 38.2 %   Monocytes Relative 8.0 %   Eosinophils Relative 1.8 %   Basophils Relative 0.8 %       Assessment & Plan:   1. Acquired hypothyroidism Recent dose decrease after TSH was suppressed, here for f/up labs No SE or concerns Repeat TSH to see if in normal range with current dose  - TSH  2. Encounter for medication monitoring  - TSH  She asks about nerve pain medications  -she is seeing Dr. Clelia Croft for this and was scheduled for an occipital nerve block for his last office visit she has previously tried duloxetine gabapentin and pregabalin - I will inform her that she has tried all the meds previously and she can f/up with the managing specialists about further tx (restart meds? Reschedule the nerve block?)     Danelle Berry, PA-C 05/09/23 12:07 PM

## 2023-05-10 ENCOUNTER — Ambulatory Visit: Admission: RE | Admit: 2023-05-10 | Payer: Medicare Other | Source: Ambulatory Visit

## 2023-05-10 DIAGNOSIS — Z79620 Long term (current) use of immunosuppressive biologic: Secondary | ICD-10-CM | POA: Diagnosis not present

## 2023-05-10 DIAGNOSIS — K5090 Crohn's disease, unspecified, without complications: Secondary | ICD-10-CM | POA: Insufficient documentation

## 2023-05-10 LAB — TSH: TSH: 2.85 mIU/L (ref 0.40–4.50)

## 2023-05-10 MED ORDER — VEDOLIZUMAB 300 MG IV SOLR
300.0000 mg | Freq: Once | INTRAVENOUS | Status: AC
  Administered 2023-05-10: 300 mg via INTRAVENOUS
  Filled 2023-05-10: qty 5

## 2023-05-11 DIAGNOSIS — H04123 Dry eye syndrome of bilateral lacrimal glands: Secondary | ICD-10-CM | POA: Diagnosis not present

## 2023-05-11 DIAGNOSIS — H02834 Dermatochalasis of left upper eyelid: Secondary | ICD-10-CM | POA: Diagnosis not present

## 2023-05-11 DIAGNOSIS — H43813 Vitreous degeneration, bilateral: Secondary | ICD-10-CM | POA: Diagnosis not present

## 2023-05-11 DIAGNOSIS — H401132 Primary open-angle glaucoma, bilateral, moderate stage: Secondary | ICD-10-CM | POA: Diagnosis not present

## 2023-05-11 DIAGNOSIS — H02831 Dermatochalasis of right upper eyelid: Secondary | ICD-10-CM | POA: Diagnosis not present

## 2023-05-11 DIAGNOSIS — Z961 Presence of intraocular lens: Secondary | ICD-10-CM | POA: Diagnosis not present

## 2023-05-11 DIAGNOSIS — H35371 Puckering of macula, right eye: Secondary | ICD-10-CM | POA: Diagnosis not present

## 2023-05-12 ENCOUNTER — Other Ambulatory Visit: Payer: Self-pay | Admitting: Medical Genetics

## 2023-05-12 DIAGNOSIS — Z006 Encounter for examination for normal comparison and control in clinical research program: Secondary | ICD-10-CM

## 2023-05-12 MED ORDER — LEVOTHYROXINE SODIUM 50 MCG PO TABS
ORAL_TABLET | ORAL | 1 refills | Status: DC
Start: 2023-05-12 — End: 2023-12-12

## 2023-05-12 NOTE — Addendum Note (Signed)
Addended by: Danelle Berry on: 05/12/2023 11:51 AM   Modules accepted: Orders

## 2023-05-26 ENCOUNTER — Ambulatory Visit: Payer: Medicare Other | Admitting: Internal Medicine

## 2023-05-26 ENCOUNTER — Other Ambulatory Visit: Payer: Medicare Other

## 2023-05-26 DIAGNOSIS — G4730 Sleep apnea, unspecified: Secondary | ICD-10-CM | POA: Diagnosis not present

## 2023-05-30 ENCOUNTER — Inpatient Hospital Stay: Payer: Medicare Other | Attending: Nurse Practitioner

## 2023-05-30 ENCOUNTER — Inpatient Hospital Stay: Payer: Medicare Other | Admitting: Nurse Practitioner

## 2023-05-30 ENCOUNTER — Ambulatory Visit: Payer: Medicare Other | Admitting: Internal Medicine

## 2023-05-30 DIAGNOSIS — Z86711 Personal history of pulmonary embolism: Secondary | ICD-10-CM | POA: Insufficient documentation

## 2023-05-30 DIAGNOSIS — R7989 Other specified abnormal findings of blood chemistry: Secondary | ICD-10-CM | POA: Insufficient documentation

## 2023-05-30 DIAGNOSIS — E876 Hypokalemia: Secondary | ICD-10-CM | POA: Insufficient documentation

## 2023-05-30 DIAGNOSIS — K5090 Crohn's disease, unspecified, without complications: Secondary | ICD-10-CM | POA: Insufficient documentation

## 2023-05-30 DIAGNOSIS — Z7901 Long term (current) use of anticoagulants: Secondary | ICD-10-CM | POA: Insufficient documentation

## 2023-05-30 DIAGNOSIS — Z803 Family history of malignant neoplasm of breast: Secondary | ICD-10-CM | POA: Insufficient documentation

## 2023-06-07 ENCOUNTER — Inpatient Hospital Stay: Payer: Medicare Other

## 2023-06-07 ENCOUNTER — Encounter: Payer: Self-pay | Admitting: Nurse Practitioner

## 2023-06-07 ENCOUNTER — Inpatient Hospital Stay (HOSPITAL_BASED_OUTPATIENT_CLINIC_OR_DEPARTMENT_OTHER): Payer: Medicare Other | Admitting: Nurse Practitioner

## 2023-06-07 VITALS — BP 140/65 | HR 84 | Temp 97.8°F | Wt 147.0 lb

## 2023-06-07 DIAGNOSIS — Z7901 Long term (current) use of anticoagulants: Secondary | ICD-10-CM

## 2023-06-07 DIAGNOSIS — R7989 Other specified abnormal findings of blood chemistry: Secondary | ICD-10-CM | POA: Diagnosis not present

## 2023-06-07 DIAGNOSIS — K5090 Crohn's disease, unspecified, without complications: Secondary | ICD-10-CM | POA: Diagnosis not present

## 2023-06-07 DIAGNOSIS — Z803 Family history of malignant neoplasm of breast: Secondary | ICD-10-CM | POA: Diagnosis not present

## 2023-06-07 DIAGNOSIS — E876 Hypokalemia: Secondary | ICD-10-CM | POA: Diagnosis not present

## 2023-06-07 DIAGNOSIS — Z86711 Personal history of pulmonary embolism: Secondary | ICD-10-CM | POA: Diagnosis not present

## 2023-06-07 LAB — COMPREHENSIVE METABOLIC PANEL
ALT: 18 U/L (ref 0–44)
AST: 22 U/L (ref 15–41)
Albumin: 4 g/dL (ref 3.5–5.0)
Alkaline Phosphatase: 60 U/L (ref 38–126)
Anion gap: 9 (ref 5–15)
BUN: 15 mg/dL (ref 8–23)
CO2: 27 mmol/L (ref 22–32)
Calcium: 9.5 mg/dL (ref 8.9–10.3)
Chloride: 101 mmol/L (ref 98–111)
Creatinine, Ser: 1.05 mg/dL — ABNORMAL HIGH (ref 0.44–1.00)
GFR, Estimated: 58 mL/min — ABNORMAL LOW (ref >60)
Glucose, Bld: 126 mg/dL — ABNORMAL HIGH (ref 70–99)
Potassium: 3.2 mmol/L — ABNORMAL LOW (ref 3.5–5.1)
Sodium: 137 mmol/L (ref 135–145)
Total Bilirubin: 0.6 mg/dL (ref 0.3–1.2)
Total Protein: 7.2 g/dL (ref 6.5–8.1)

## 2023-06-07 LAB — CBC WITH DIFFERENTIAL/PLATELET
Abs Immature Granulocytes: 0.01 10*3/uL (ref 0.00–0.07)
Basophils Absolute: 0 10*3/uL (ref 0.0–0.1)
Basophils Relative: 1 %
Eosinophils Absolute: 0.1 10*3/uL (ref 0.0–0.5)
Eosinophils Relative: 2 %
HCT: 38 % (ref 36.0–46.0)
Hemoglobin: 12.2 g/dL (ref 12.0–15.0)
Immature Granulocytes: 0 %
Lymphocytes Relative: 34 %
Lymphs Abs: 1.9 10*3/uL (ref 0.7–4.0)
MCH: 29 pg (ref 26.0–34.0)
MCHC: 32.1 g/dL (ref 30.0–36.0)
MCV: 90.5 fL (ref 80.0–100.0)
Monocytes Absolute: 0.3 10*3/uL (ref 0.1–1.0)
Monocytes Relative: 6 %
Neutro Abs: 3.1 10*3/uL (ref 1.7–7.7)
Neutrophils Relative %: 57 %
Platelets: 185 10*3/uL (ref 150–400)
RBC: 4.2 MIL/uL (ref 3.87–5.11)
RDW: 13 % (ref 11.5–15.5)
WBC: 5.5 10*3/uL (ref 4.0–10.5)
nRBC: 0 % (ref 0.0–0.2)

## 2023-06-07 NOTE — Progress Notes (Signed)
[facility]  [phone] [phone]) [phone]  ID: Carolyn Campbell OB: 1956/07/17  MR#: [id number]  [facility]#:[id number]  Patient Care Team: Danelle Berry, PA-C as PCP - General (Family Medicine) Mariah Milling Tollie Pizza, MD as PCP - Cardiology (Cardiology) Toney Reil, MD as Consulting Physician (Gastroenterology) Domingo Madeira, OD as Consulting Physician (Optometry) Linus Salmons, MD as Consulting Physician (Otolaryngology) Michaelyn Barter, MD as Consulting Physician (Oncology)  REFERRING PROVIDER: Danelle Berry  REASON FOR REFERRAL: Recurrent PE  HPI: Carolyn Campbell is a 67 y.o. female with past medical history of COPD, COVID, Crohn's, hyperlipidemia, hypertension, hypothyroidism, CAD, stroke, depression was referred to hematology with history of recurrent PE.  Patient was admitted in October 2023 for shortness of breath.  CTA chest from 07/19/2022 showed small clot in the lower lobe branch of right pulmonary artery with mild right heart strain. At that time she also had episode of catatonia and loss of consciousness.  She was evaluated by neurology and psychiatry with EEG.  There was concern for behavioral disorder. Patient denies any history of recent surgery, long distance flights or OCP use.  Patient had PE in August 2022 which was attributed to her recent COVID infection.  She was anticoagulated for 3 months.  She is currently on Eliquis and tolerating well.  She had colonoscopy with Dr. Allegra Lai in January 2024.  Ileum was normal.  Mucosal inflammatory changes secondary to Crohn's with colonic involvement were present.  Distal rectum and anal verge were normal.  Interval history-patient is 67 year old female with above history of recurrent PE, on lifelong anticoagulation with Eliquis, who returns to clinic for follow-up.  She continues to tolerate Eliquis well.  She denies black or bloody stools.  Denies vaginal bleeding.  Denies hematuria.  Bruises easily  but denies injury.  No falls.   REVIEW OF SYSTEMS:   Review of Systems  Constitutional:  Negative for chills, fever, malaise/fatigue and weight loss.  HENT:  Negative for nosebleeds.   Eyes: Negative.   Respiratory:  Negative for cough, hemoptysis, sputum production and shortness of breath.   Cardiovascular:  Negative for chest pain and leg swelling.  Gastrointestinal:  Negative for abdominal pain, blood in stool, constipation, diarrhea, heartburn, nausea and vomiting.  Genitourinary: Negative.  Negative for hematuria.  Musculoskeletal:  Positive for back pain, joint pain and neck pain. Negative for falls.  Skin: Negative.   Neurological:  Negative for dizziness, tingling, sensory change, speech change, weakness and headaches.  Endo/Heme/Allergies:  Bruises/bleeds easily.  Psychiatric/Behavioral:  Negative for depression. The patient is not nervous/anxious.   As per HPI. Otherwise, a complete review of systems is negative.  PAST MEDICAL HISTORY: Past Medical History:  Diagnosis Date   Abdominal pain, epigastric    Acute metabolic encephalopathy 07/22/2022   Allergic rhinitis    Arthritis    Asthma    Breast cancer (HCC) 2006   LT LUMPECTOMY   Cataract    Clotting disorder (HCC)    Cognitive deficits as late effect of cerebrovascular disease    Convulsions, unspecified convulsion type (HCC) 09/24/2022   COPD (chronic obstructive pulmonary disease) (HCC)    COVID-19 virus infection 04/2021   Crohn's disease (HCC) 05/21/2015   FOLLOWED BY GI   Crohn's disease of both small and large intestine with rectal bleeding (HCC) 12/04/2014   Depression    Currently taking zoloft.   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 05/2021 Echo: EF  60-65%, no rwma, nl RV fxn.   History of kidney stones    Hypercholesterolemia 01/18/2007   Hyperlipidemia    Hypertension    Hypothyroidism    Low back pain radiating to left leg 06/17/2021   Nonobstructive CAD (coronary artery disease)    a. 10/2012 Cath: diffuse minor irregs-->med rx; b. 08/2021 Cor CTA: Ca2+ = 55.6 (77th%'ile), LAD 25p, LCX <25p. No signif non-cardiac findings.   Occlusion, cerebral artery    NOS w/infarction   Osteoporosis    Overweight (BMI 25.0-29.9) 07/17/2022   Personal history of radiation therapy 2006   BREAST CA   Pulmonary embolism (HCC)    a.04/2021 CTA Chest: Small filling defects are noted in upper and lower lobe branches of L PA-->acute PE-->eliquis. *PE occurred in context of COVID infxn.   Stroke [facility])    Ulcer     PAST SURGICAL HISTORY: Past Surgical History:  Procedure Laterality Date   BREAST BIOPSY Left 2006   POS   BREAST LUMPECTOMY Left 09/20/2004   positive   BREAST SURGERY Left    malignant biopsy   CARDIAC CATHETERIZATION     COLONOSCOPY  2015   COLONOSCOPY WITH PROPOFOL N/A 06/08/2017   Procedure: COLONOSCOPY WITH PROPOFOL;  Surgeon: Toney Reil, MD;  Location: [facility];  Service: Gastroenterology;  Laterality: N/A;   COLONOSCOPY WITH PROPOFOL N/A 03/08/2019   Procedure: COLONOSCOPY WITH PROPOFOL;  Surgeon: Toney Reil, MD;  Location: [facility] ENDOSCOPY;  Service: Gastroenterology;  Laterality: N/A;   COLONOSCOPY WITH PROPOFOL N/A 07/16/2020   Procedure: COLONOSCOPY WITH PROPOFOL;  Surgeon: Toney Reil, MD;  Location: [facility] ENDOSCOPY;  Service: Gastroenterology;  Laterality: N/A;   COLONOSCOPY WITH PROPOFOL N/A 09/30/2022   Procedure: COLONOSCOPY WITH PROPOFOL;  Surgeon: Toney Reil, MD;  Location: [facility] ENDOSCOPY;  Service: Gastroenterology;  Laterality: N/A;   ESOPHAGOGASTRODUODENOSCOPY  2015   ESOPHAGOGASTRODUODENOSCOPY (EGD) WITH PROPOFOL N/A 06/08/2017   Procedure:  ESOPHAGOGASTRODUODENOSCOPY (EGD) WITH PROPOFOL;  Surgeon: Toney Reil, MD;  Location: [facility];  Service: Gastroenterology;  Laterality: N/A;   ESOPHAGOGASTRODUODENOSCOPY (EGD) WITH PROPOFOL N/A 03/08/2019   Procedure: ESOPHAGOGASTRODUODENOSCOPY (EGD) WITH PROPOFOL;  Surgeon: Toney Reil, MD;  Location: [facility] ENDOSCOPY;  Service: Gastroenterology;  Laterality: N/A;   EYE SURGERY     FINGER SURGERY     Right small finger   FRACTURE SURGERY     GIVENS CAPSULE STUDY  2015   TUBAL LIGATION      FAMILY HISTORY: Family History  Problem Relation Age of Onset   Heart disease Mother    Heart attack Mother    Hypertension Mother    Varicose Veins Mother    Cerebrovascular Accident Father    Arthritis Father    Hypertension Father    Dementia Father    Breast cancer Sister    Cancer Sister    Breast cancer Sister 72   Cancer Sister    Cancer Sister    Hypertension Other    Diabetes Other    Heart attack Other 93   Mental illness Neg Hx     HEALTH MAINTENANCE: Social History   Tobacco Use   Smoking status: Never   Smokeless tobacco: Never  Vaping Use   Vaping status: Never Used  Substance Use Topics  Alcohol use: Never   Drug use: Never     No Known Allergies  Current Outpatient Medications  Medication Sig Dispense Refill   albuterol (VENTOLIN HFA) 108 (90 Base) MCG/ACT inhaler Inhale 2 puffs into the lungs every 6 (six) hours as needed for wheezing or shortness of breath (chest tightness). 8 g 2   brimonidine (ALPHAGAN) 0.2 % ophthalmic solution Place 1 drop into both eyes in the morning and at bedtime.     latanoprost (XALATAN) 0.005 % ophthalmic solution Place 1 drop into both eyes at bedtime.      levothyroxine (SYNTHROID) 50 MCG tablet Take 50 mcg po qam before breakfast 5 d a week, and take 100 mcg (2 tabs) po qam before breakfast 2 d a week 110 tablet 1   losartan-hydrochlorothiazide (HYZAAR) 50-12.5 MG tablet Take 1 tablet by mouth daily.  90 tablet 2   Multiple Vitamin (MULTIVITAMIN) tablet Take 1 tablet by mouth daily.     rosuvastatin (CRESTOR) 40 MG tablet Take 1 tablet (40 mg total) by mouth daily. 90 tablet 3   sertraline (ZOLOFT) 50 MG tablet Take 1 tablet by mouth once daily 90 tablet 1   vedolizumab (ENTYVIO) 300 MG injection Induction Entyvio 300mg  at week 0,2, and 6 and then maintenance does every 8 weeks 1 each 12   apixaban (ELIQUIS) 5 MG TABS tablet Take 1 tablet (5 mg total) by mouth 2 (two) times daily. 60 tablet 0   No current facility-administered medications for this visit.    OBJECTIVE: Vitals:   06/07/23 1026  BP: (!) 140/65  Pulse: 84  Temp: 97.8 F (36.6 C)  SpO2: 100%     Body mass index is 28.71 kg/m.      General: Well-developed, well-nourished, no acute distress. Eyes: Pink conjunctiva, anicteric sclera. Lungs: Clear to auscultation bilaterally.  No audible wheezing or coughing Heart: Regular rate and rhythm.  Abdomen: Soft, nontender, nondistended.  Musculoskeletal: No edema, cyanosis, or clubbing. Neuro: Alert, answering all questions appropriately. Cranial nerves grossly intact. Skin: No rashes or petechiae noted. Psych: Normal affect.  LAB RESULTS: Lab Results  Component Value Date   NA 137 06/07/2023   K 3.2 (L) 06/07/2023   CL 101 06/07/2023   CO2 27 06/07/2023   GLUCOSE 126 (H) 06/07/2023   BUN 15 06/07/2023   CREATININE 1.05 (H) 06/07/2023   CALCIUM 9.5 06/07/2023   PROT 7.2 06/07/2023   ALBUMIN 4.0 06/07/2023   AST 22 06/07/2023   ALT 18 06/07/2023   ALKPHOS 60 06/07/2023   BILITOT 0.6 06/07/2023   GFRNONAA 58 (L) 06/07/2023   GFRAA 78 01/29/2021    Lab Results  Component Value Date   WBC 5.5 06/07/2023   NEUTROABS 3.1 06/07/2023   HGB 12.2 06/07/2023   HCT 38.0 06/07/2023   MCV 90.5 06/07/2023   PLT 185 06/07/2023    Lab Results  Component Value Date   TIBC 368 09/08/2022   TIBC 330 10/17/2019   TIBC 378 01/17/2018   FERRITIN 56 09/08/2022    FERRITIN 66 04/23/2022   FERRITIN 76 10/17/2019   IRONPCTSAT 16 09/08/2022   IRONPCTSAT 26 10/17/2019   IRONPCTSAT 14 01/17/2018     STUDIES: No results found.  ASSESSMENT AND PLAN:   Carolyn Campbell is a 67 y.o. female with pmh of of COPD, COVID, Crohn's, hyperlipidemia, hypertension, hypothyroidism, CAD, stroke, depression was referred to hematology with history of recurrent PE.  # Recurrent PE-  - history of PE in August 2022, likely from  Covid infection. PE in October 2023, unprovoked. Hypercoaguable workup was negative. Due to 2 episodes of blood clots, lifelong anticoagulation was recommended. She continues eliquis 5 mg twice daily. Tolerating well. Denies bleeding. Reviewed bleeding precautions and fall precautions.   # Crohn's disease -Follows with Dr. Allegra Lai.  Had colonoscopy in February 2024 which showed colitis.  No evidence of dysplasia or malignancy.  She completed a round of prednisone which has helped with her symptoms.  Rectal bleeding has resolved.  # Mild hypokalemia - Declined Kdur d/t concerns of pill burden. Elected for dietary sources.  - K 3.2 today. Likely related to hydrochlorothiazide use. Recommend she follow up with pcp for management.   # Abnormal serum creatinine - creatinine elevated compared to baseline of 0.7-0.8; today 1.05. Encouraged hydration and avoidance of nephrotoxic substances.   Disposition:  6 mo- lab (cbc, cmp), Dr Alena Bills- [place]  No orders of the defined types were placed in this encounter.  Patient expressed understanding and was in agreement with this plan. She also understands that She can call clinic at any time with any questions, concerns, or complaints.   I spent a total of 20 minutes reviewing chart data, face-to-face evaluation with the patient, counseling and coordination of care as detailed above.  Alinda Dooms, NP   06/07/2023

## 2023-06-08 ENCOUNTER — Other Ambulatory Visit: Payer: Self-pay | Admitting: Physical Medicine & Rehabilitation

## 2023-06-08 DIAGNOSIS — G8929 Other chronic pain: Secondary | ICD-10-CM

## 2023-06-13 ENCOUNTER — Encounter: Payer: Self-pay | Admitting: Internal Medicine

## 2023-06-15 ENCOUNTER — Ambulatory Visit: Payer: Medicare Other | Admitting: Physical Therapy

## 2023-06-20 ENCOUNTER — Ambulatory Visit: Payer: Medicare Other | Admitting: Gastroenterology

## 2023-06-23 ENCOUNTER — Ambulatory Visit
Admission: RE | Admit: 2023-06-23 | Discharge: 2023-06-23 | Disposition: A | Discharge status: Home / Self Care | Payer: Medicare Other | Source: Ambulatory Visit | Attending: Physical Medicine & Rehabilitation | Admitting: Physical Medicine & Rehabilitation

## 2023-06-23 DIAGNOSIS — M47816 Spondylosis without myelopathy or radiculopathy, lumbar region: Secondary | ICD-10-CM | POA: Diagnosis not present

## 2023-06-23 DIAGNOSIS — G8929 Other chronic pain: Secondary | ICD-10-CM

## 2023-06-23 DIAGNOSIS — M5126 Other intervertebral disc displacement, lumbar region: Secondary | ICD-10-CM | POA: Diagnosis not present

## 2023-06-24 NOTE — Therapy (Signed)
OUTPATIENT PHYSICAL THERAPY THORACOLUMBAR EVALUATION   Patient Name: Carolyn Campbell MRN: [id number] DOB:1956/03/05, 67 y.o., female Today's Date: 06/27/2023  END OF SESSION:  PT End of Session - 06/27/23 0936     Visit Number 1    Number of Visits 17    Date for PT Re-Evaluation 08/22/23    PT Start Time 0940    PT Stop Time 1028    PT Time Calculation (min) 48 min    Activity Tolerance Patient tolerated treatment well    Behavior During Therapy [facility] for tasks assessed/performed             Past Medical History:  Diagnosis Date   Abdominal pain, epigastric    Acute metabolic encephalopathy 07/22/2022   Allergic rhinitis    Arthritis    Asthma    Breast cancer (HCC) 2006   LT LUMPECTOMY   Cataract    Clotting disorder (HCC)    Cognitive deficits as late effect of cerebrovascular disease    Convulsions, unspecified convulsion type (HCC) 09/24/2022   COPD (chronic obstructive pulmonary disease) (HCC)    COVID-19 virus infection 04/2021   Crohn's disease (HCC) 05/21/2015   FOLLOWED BY GI   Crohn's disease of both small and large intestine with rectal bleeding (HCC) 12/04/2014   Depression    Currently taking zoloft.   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 05/2021 Echo: EF 60-65%, no rwma, nl RV fxn.   History of kidney stones    Hypercholesterolemia 01/18/2007   Hyperlipidemia    Hypertension    Hypothyroidism    Low back pain radiating to left leg 06/17/2021   Nonobstructive CAD (coronary artery disease)    a. 10/2012 Cath: diffuse minor irregs-->med rx; b. 08/2021 Cor CTA: Ca2+ = 55.6 (77th%'ile), LAD 25p, LCX <25p. No signif non-cardiac findings.   Occlusion, cerebral artery    NOS w/infarction   Osteoporosis     Overweight (BMI 25.0-29.9) 07/17/2022   Personal history of radiation therapy 2006   BREAST CA   Pulmonary embolism (HCC)    a.04/2021 CTA Chest: Small filling defects are noted in upper and lower lobe branches of L PA-->acute PE-->eliquis. *PE occurred in context of COVID infxn.   Stroke [facility])    Ulcer    Past Surgical History:  Procedure Laterality Date   BREAST BIOPSY Left 2006   POS   BREAST LUMPECTOMY Left 09/20/2004   positive   BREAST SURGERY Left    malignant biopsy   CARDIAC CATHETERIZATION     COLONOSCOPY  2015   COLONOSCOPY WITH PROPOFOL N/A 06/08/2017   Procedure: COLONOSCOPY WITH PROPOFOL;  Surgeon: Toney Reil, MD;  Location: [facility] SURGERY [facility];  Service: Gastroenterology;  Laterality: N/A;   COLONOSCOPY WITH PROPOFOL N/A 03/08/2019   Procedure: COLONOSCOPY WITH PROPOFOL;  Surgeon: Toney Reil, MD;  Location: [facility] ENDOSCOPY;  Service: Gastroenterology;  Laterality: N/A;   COLONOSCOPY WITH PROPOFOL N/A 07/16/2020   Procedure: COLONOSCOPY WITH PROPOFOL;  Surgeon: Toney Reil, MD;  Location: [facility] ENDOSCOPY;  Service: Gastroenterology;  Laterality: N/A;   COLONOSCOPY WITH PROPOFOL N/A 09/30/2022   Procedure: COLONOSCOPY WITH PROPOFOL;  Surgeon: Toney Reil, MD;  Location: [facility] ENDOSCOPY;  Service: Gastroenterology;  Laterality: N/A;  ESOPHAGOGASTRODUODENOSCOPY  2015   ESOPHAGOGASTRODUODENOSCOPY (EGD) WITH PROPOFOL N/A 06/08/2017   Procedure: ESOPHAGOGASTRODUODENOSCOPY (EGD) WITH PROPOFOL;  Surgeon: Toney Reil, MD;  Location: [facility] SURGERY [facility];  Service: Gastroenterology;  Laterality: N/A;   ESOPHAGOGASTRODUODENOSCOPY (EGD) WITH PROPOFOL N/A 03/08/2019   Procedure: ESOPHAGOGASTRODUODENOSCOPY (EGD) WITH PROPOFOL;  Surgeon: Toney Reil, MD;  Location: [facility] ENDOSCOPY;  Service: Gastroenterology;  Laterality: N/A;   EYE SURGERY     FINGER SURGERY     Right small finger   FRACTURE SURGERY     GIVENS CAPSULE STUDY  2015    TUBAL LIGATION     Patient Active Problem List   Diagnosis Date Noted   REM behavioral disorder 03/14/2023   Excessive daytime sleepiness 03/14/2023   Crohn's disease of large intestine without complication (HCC) 09/30/2022   Convulsions, unspecified convulsion type (HCC) 09/24/2022   Encounter for current long-term use of anticoagulants 09/24/2022   MDD (major depressive disorder), recurrent episode, mild (HCC) 08/18/2022   Restless leg syndrome 08/18/2022   Pulmonary embolism (HCC) 07/16/2022   Pain in limb 11/10/2021   CAD (coronary artery disease) 08/06/2021   Glaucoma 02/23/2021   Other long term (current) drug therapy 01/22/2021   Varicose veins of both lower extremities with pain 07/19/2018   Arthritis of right hand 12/21/2017   Major depression in remission (HCC) 12/21/2017   Fibrocystic breast changes 06/22/2017   Osteopenia 10/18/2016   Atherosclerosis of aorta (HCC) 12/19/2015   Vitreous degeneration 05/21/2015   Glaucoma associated with chamber angle anomalies 05/21/2015   H/O malignant neoplasm of breast 05/21/2015   Crohn's disease of both small and large intestine with rectal bleeding (HCC) 12/04/2014   Solitary pulmonary nodule 11/07/2012   Cognitive deficits as late effect of cerebrovascular disease 01/20/2010   CVA, old, hemiparesis (HCC) 04/28/2009   Acquired hypothyroidism 06/12/2008   HLD (hyperlipidemia) 01/18/2007   Primary hypertension 01/18/2007    PCP: Danelle Berry, PA-C  REFERRING PROVIDER: Elijah Birk, MD  REFERRING DIAG:  [phone], G89.29, Chronic left sided LBP with left sided sciatica  Rationale for Evaluation and Treatment: Rehabilitation  THERAPY DIAG:  Chronic left-sided low back pain with left-sided sciatica  Difficulty in walking, not elsewhere classified  Muscle weakness (generalized)  Unsteadiness on feet  ONSET DATE: >2 years   SUBJECTIVE:                                                                                                                                                                                            SUBJECTIVE STATEMENT: Patient reports that she is here for her "back" but her back is not bothersome. It is the  pain down the L leg to the knee and at the top of her buttocks. Had been receiving steroid injections but wants to avoid getting anymore shots.  She has had PT for similar symptoms in 2022. Patient reports that PT was beneficial when she participated. Attempted the HEP from prior PT but was not consistent to become beneficial. She tried to go start back to the gym x 1 month ago but unable to tolerate due to pain in her LLE.  Patient takes Tylenol and Tumeric for pain management.   PERTINENT HISTORY:  Chronic left-sided low back pain with left-sided sciatica, patient complains of pain down left buttock down through left calf. Duration: over four years PMH: recurrent PE on Eliquis, COPD, COVID, Crohn's, hyperlipidemia, hypertension, hypothyroidism, CAD, stroke, depression  PAIN:  Are you having pain? Yes: NPRS scale: 4/10 Pain location: L buttocks and leg (mid shin to hip) Pain description: dull, ache Aggravating factors: walking, household cleaning, exercise, sleeping,  Relieving factors: medication, heat   PRECAUTIONS: Fall on Eliquis   RED FLAGS: None   WEIGHT BEARING RESTRICTIONS: No  FALLS:  Has patient fallen in last 6 months? No  LIVING ENVIRONMENT: Lives with: lives with their spouse Lives in: House/apartment Stairs: Yes: External: flight to basement steps; on right going up Has following equipment at home: None  OCCUPATION: Retired   PLOF: Independent  PATIENT GOALS: to make the pain better and to be able to manage it on my own   NEXT MD VISIT: 06/2023  OBJECTIVE:  Note: Objective measures were completed at Evaluation unless otherwise noted.  DIAGNOSTIC FINDINGS:  MRI pending from 10/3  PATIENT SURVEYS:  FOTO 45 with goal of 51  SCREENING FOR  RED FLAGS: Bowel or bladder incontinence: No Spinal tumors: No Cauda equina syndrome: No Compression fracture: No Abdominal aneurysm: No  COGNITION: Overall cognitive status: Within functional limits for tasks assessed     SENSATION: WFL  POSTURE: rounded shoulders, forward head, and weight shift right  PALPATION: -Localized pain with CPA to all lumbar segments except L4 and L5 with radiating symptoms to buttocks and posterior thigh -L paraspinals, QL and piriformis tightness noted with TTP to all areas, requiring reduced pressure. -Limited hamstring flexibility to 45 degrees from 90/90 position   LUMBAR ROM:   AROM eval  Flexion Fingertips to mid shin  Extension 50%  Right lateral flexion WFL  Left lateral flexion WFL   Right rotation 20%  Left rotation 20%   (Blank rows = not tested)  -No pain reported during ROM testing   LOWER EXTREMITY MMT:    MMT Right eval Left eval  Hip flexion 4 2+  Hip extension    Hip abduction 4 4  Hip adduction 4- 3+  Knee flexion 4 3-  Knee extension 4 3+  Ankle dorsiflexion 4 3+  Ankle plantarflexion 4+ 3+   (Blank rows = not tested)   LUMBAR SPECIAL TESTS:  Straight leg raise test: Positive, FABER test: Positive  FUNCTIONAL TESTS:  5 times sit to stand: 34.53 sec 6 minute walk test: NT on eval due to time constraints  GAIT: Distance walked: 50' Assistive device utilized: None Level of assistance: Modified independence Comments: decreased stance time on L, weight shift to the R with R lateral lean, decreased knee flexion on L   TODAY'S TREATMENT:  DATE: 06/27/23    PATIENT EDUCATION:  Education details: HEP, POC, goals  Person educated: Patient Education method: Medical illustrator Education comprehension: verbalized understanding, returned demonstration, and needs further  education  HOME EXERCISE PROGRAM: Access Code: [email or web address] URL: [email or web address] Date: 06/27/2023 Prepared by: Maylon Peppers  Exercises - Supine Lower Trunk Rotation  - 2-3 x daily - 5-7 x weekly - 2 sets - 10 reps  ASSESSMENT:  CLINICAL IMPRESSION: Patient is a 67 y.o. female who was seen today for physical therapy evaluation and treatment for chronic left-sided low back pain with left-sided sciatica. Patient has a history of CVA affecting the LLE as well so unsure if neurologic weakness is from CVA or low back lesion. Patient presents with weakness, impaired motor control, activity tolerance, and balance impairments. Patient has difficulty with prolonged walking/standing, sleeping, carrying, bending, etc. Patient with decreased muscle performance as seen with 5xSTS and MMT. Patient will benefit from skilled PT interventions to address current impairments to improve quality of life and overall mobility with reduction in current symptoms.    OBJECTIVE IMPAIRMENTS: Abnormal gait, cardiopulmonary status limiting activity, decreased activity tolerance, decreased balance, decreased coordination, decreased endurance, decreased mobility, decreased ROM, decreased strength, impaired flexibility, improper body mechanics, postural dysfunction, and pain.   ACTIVITY LIMITATIONS: lifting, bending, sitting, standing, and squatting  PARTICIPATION LIMITATIONS: cleaning, laundry, shopping, and community activity  PERSONAL FACTORS: Age, Behavior pattern, Past/current experiences, Time since onset of injury/illness/exacerbation, and 3+ comorbidities:  recurrent PE on Eliquis, COPD, COVID, Crohn's, hyperlipidemia, hypertension, hypothyroidism, CAD, stroke, depression  are also affecting patient's functional outcome.   REHAB POTENTIAL: Fair    CLINICAL DECISION MAKING: Stable/uncomplicated  EVALUATION COMPLEXITY: Moderate   GOALS: Goals reviewed with patient? Yes  SHORT TERM GOALS:  Target date: 07/22/2023   Patient will be independent in HEP to improve strength/mobility for better functional independence with ADLs. Baseline: 10/7: HEP initiated  Goal status: INITIAL  LONG TERM GOALS: Target date: 08/19/2023   Patient will increase FOTO score to equal to or greater than 58 to demonstrate statistically significant improvement in mobility and quality of life.  Baseline: 10/7: 45 Goal status: INITIAL  2.  Patient will increase six minute walk test distance by 200' for progression to more efficient community ambulator and improve gait ability Baseline: 10/7: to be tested on visit #2 Goal status: INITIAL  3.  Patient (> 6 years old) will complete five times sit to stand test in < 15 seconds indicating an increased LE strength and improved balance. Baseline: 10/7: 34.53 sec Goal status: INITIAL  4.  Patient will demonstrate full lumbar AROM with no compensations or increase in pain in all planes except intermittent end range discomfort to allow patient to complete valued activities with less difficulty.  Baseline: 10/7: see above  Goal status: INITIAL  5.  Patient will improve BLE strength by >0.5 MMT grade to demonstrate improved functional strength, increased standing tolerance, and increased ADL ability.   Baseline: 10/7: see above  Goal status: INITIAL  6. Patient will report a worst pain of 2/10 on NRPS in low back and LLE to improve tolerance with ADLs and reduced symptoms with activities.  Baseline: 10/7: 4/10 Goal status: INITIAL  PLAN:  PT FREQUENCY: 1-2x/week  PT DURATION: 8 weeks  PLANNED INTERVENTIONS: Therapeutic exercises, Therapeutic activity, Neuromuscular re-education, Balance training, Gait training, Patient/Family education, Self Care, Joint mobilization, Joint manipulation, DME instructions, Aquatic Therapy, Dry Needling, Spinal manipulation, Spinal mobilization, Cryotherapy, Moist heat, Traction, and Manual therapy.  PLAN FOR  NEXT  SESSION: , HEP review, manual therapy, core strengthening    Maylon Peppers, PT, DPT Physical Therapist - [place]  [facility]  06/27/2023, 10:37 AM

## 2023-06-27 ENCOUNTER — Ambulatory Visit: Payer: Medicare Other | Attending: Physical Medicine & Rehabilitation

## 2023-06-27 ENCOUNTER — Encounter: Payer: Self-pay | Admitting: Physical Therapy

## 2023-06-27 DIAGNOSIS — M6281 Muscle weakness (generalized): Secondary | ICD-10-CM | POA: Insufficient documentation

## 2023-06-27 DIAGNOSIS — R2681 Unsteadiness on feet: Secondary | ICD-10-CM | POA: Insufficient documentation

## 2023-06-27 DIAGNOSIS — R262 Difficulty in walking, not elsewhere classified: Secondary | ICD-10-CM | POA: Diagnosis not present

## 2023-06-27 DIAGNOSIS — M5442 Lumbago with sciatica, left side: Secondary | ICD-10-CM | POA: Diagnosis not present

## 2023-06-27 DIAGNOSIS — G8929 Other chronic pain: Secondary | ICD-10-CM | POA: Diagnosis not present

## 2023-06-29 ENCOUNTER — Telehealth: Payer: Self-pay | Admitting: Gastroenterology

## 2023-06-29 ENCOUNTER — Ambulatory Visit: Payer: Medicare Other | Admitting: Physical Therapy

## 2023-06-29 ENCOUNTER — Encounter: Payer: Self-pay | Admitting: Physical Therapy

## 2023-06-29 VITALS — BP 140/58 | HR 61

## 2023-06-29 DIAGNOSIS — G8929 Other chronic pain: Secondary | ICD-10-CM | POA: Diagnosis not present

## 2023-06-29 DIAGNOSIS — M5442 Lumbago with sciatica, left side: Secondary | ICD-10-CM | POA: Diagnosis not present

## 2023-06-29 DIAGNOSIS — R262 Difficulty in walking, not elsewhere classified: Secondary | ICD-10-CM | POA: Diagnosis not present

## 2023-06-29 DIAGNOSIS — M6281 Muscle weakness (generalized): Secondary | ICD-10-CM | POA: Diagnosis not present

## 2023-06-29 DIAGNOSIS — R2681 Unsteadiness on feet: Secondary | ICD-10-CM

## 2023-06-29 NOTE — Telephone Encounter (Signed)
Patient called to confirm her appointment date and time.

## 2023-06-29 NOTE — Therapy (Signed)
OUTPATIENT PHYSICAL THERAPY TREATMENT   Patient Name: Carolyn Campbell MRN: [id number] DOB:April 06, 1956, 67 y.o., female Today's Date: 06/29/2023  END OF SESSION:  PT End of Session - 06/29/23 1601     Visit Number 2    Number of Visits 17    Date for PT Re-Evaluation 08/22/23    Authorization Type needs auth    Authorization Time Period auth req submitted 10/7    Progress Note Due on Visit 10    PT Start Time 1518    PT Stop Time 1601    PT Time Calculation (min) 43 min    Activity Tolerance Patient tolerated treatment well    Behavior During Therapy [facility] for tasks assessed/performed              Past Medical History:  Diagnosis Date   Abdominal pain, epigastric    Acute metabolic encephalopathy 07/22/2022   Allergic rhinitis    Arthritis    Asthma    Breast cancer (HCC) 2006   LT LUMPECTOMY   Cataract    Clotting disorder (HCC)    Cognitive deficits as late effect of cerebrovascular disease    Convulsions, unspecified convulsion type (HCC) 09/24/2022   COPD (chronic obstructive pulmonary disease) (HCC)    COVID-19 virus infection 04/2021   Crohn's disease (HCC) 05/21/2015   FOLLOWED BY GI   Crohn's disease of both small and large intestine with rectal bleeding (HCC) 12/04/2014   Depression    Currently taking zoloft.   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 05/2021 Echo: EF 60-65%, no rwma, nl RV fxn.   History of kidney stones    Hypercholesterolemia 01/18/2007   Hyperlipidemia    Hypertension    Hypothyroidism    Low back pain radiating to left leg 06/17/2021   Nonobstructive CAD (coronary artery disease)    a. 10/2012 Cath: diffuse minor irregs-->med rx; b. 08/2021 Cor CTA: Ca2+ = 55.6 (77th%'ile), LAD 25p, LCX  <25p. No signif non-cardiac findings.   Occlusion, cerebral artery    NOS w/infarction   Osteoporosis    Overweight (BMI 25.0-29.9) 07/17/2022   Personal history of radiation therapy 2006   BREAST CA   Pulmonary embolism (HCC)    a.04/2021 CTA Chest: Small filling defects are noted in upper and lower lobe branches of L PA-->acute PE-->eliquis. *PE occurred in context of COVID infxn.   Stroke [facility])    Ulcer    Past Surgical History:  Procedure Laterality Date   BREAST BIOPSY Left 2006   POS   BREAST LUMPECTOMY Left 09/20/2004   positive   BREAST SURGERY Left    malignant biopsy   CARDIAC CATHETERIZATION     COLONOSCOPY  2015   COLONOSCOPY WITH PROPOFOL N/A 06/08/2017   Procedure: COLONOSCOPY WITH PROPOFOL;  Surgeon: Toney Reil, MD;  Location: [facility];  Service: Gastroenterology;  Laterality: N/A;   COLONOSCOPY WITH PROPOFOL N/A 03/08/2019   Procedure: COLONOSCOPY WITH PROPOFOL;  Surgeon: Toney Reil, MD;  Location: [facility] ENDOSCOPY;  Service: Gastroenterology;  Laterality: N/A;   COLONOSCOPY WITH PROPOFOL N/A 07/16/2020   Procedure: COLONOSCOPY WITH PROPOFOL;  Surgeon: Toney Reil, MD;  Location: [facility] ENDOSCOPY;  Service: Gastroenterology;  Laterality: N/A;   COLONOSCOPY  WITH PROPOFOL N/A 09/30/2022   Procedure: COLONOSCOPY WITH PROPOFOL;  Surgeon: Toney Reil, MD;  Location: [facility] ENDOSCOPY;  Service: Gastroenterology;  Laterality: N/A;   ESOPHAGOGASTRODUODENOSCOPY  2015   ESOPHAGOGASTRODUODENOSCOPY (EGD) WITH PROPOFOL N/A 06/08/2017   Procedure: ESOPHAGOGASTRODUODENOSCOPY (EGD) WITH PROPOFOL;  Surgeon: Toney Reil, MD;  Location: [facility];  Service: Gastroenterology;  Laterality: N/A;   ESOPHAGOGASTRODUODENOSCOPY (EGD) WITH PROPOFOL N/A 03/08/2019   Procedure: ESOPHAGOGASTRODUODENOSCOPY (EGD) WITH PROPOFOL;  Surgeon: Toney Reil, MD;  Location: [facility] ENDOSCOPY;  Service: Gastroenterology;  Laterality: N/A;   EYE  SURGERY     FINGER SURGERY     Right small finger   FRACTURE SURGERY     GIVENS CAPSULE STUDY  2015   TUBAL LIGATION     Patient Active Problem List   Diagnosis Date Noted   REM behavioral disorder 03/14/2023   Excessive daytime sleepiness 03/14/2023   Crohn's disease of large intestine without complication (HCC) 09/30/2022   Convulsions, unspecified convulsion type (HCC) 09/24/2022   Encounter for current long-term use of anticoagulants 09/24/2022   MDD (major depressive disorder), recurrent episode, mild (HCC) 08/18/2022   Restless leg syndrome 08/18/2022   Pulmonary embolism (HCC) 07/16/2022   Pain in limb 11/10/2021   CAD (coronary artery disease) 08/06/2021   Glaucoma 02/23/2021   Other long term (current) drug therapy 01/22/2021   Varicose veins of both lower extremities with pain 07/19/2018   Arthritis of right hand 12/21/2017   Major depression in remission (HCC) 12/21/2017   Fibrocystic breast changes 06/22/2017   Osteopenia 10/18/2016   Atherosclerosis of aorta (HCC) 12/19/2015   Vitreous degeneration 05/21/2015   Glaucoma associated with chamber angle anomalies 05/21/2015   H/O malignant neoplasm of breast 05/21/2015   Crohn's disease of both small and large intestine with rectal bleeding (HCC) 12/04/2014   Solitary pulmonary nodule 11/07/2012   Cognitive deficits as late effect of cerebrovascular disease 01/20/2010   CVA, old, hemiparesis (HCC) 04/28/2009   Acquired hypothyroidism 06/12/2008   HLD (hyperlipidemia) 01/18/2007   Primary hypertension 01/18/2007    PCP: Danelle Berry, PA-C  REFERRING PROVIDER: Elijah Birk, MD  REFERRING DIAG:  [phone], G89.29, Chronic left sided LBP with left sided sciatica  Rationale for Evaluation and Treatment: Rehabilitation  THERAPY DIAG:  Chronic left-sided low back pain with left-sided sciatica  Difficulty in walking, not elsewhere classified  Muscle weakness (generalized)  Unsteadiness on feet  ONSET  DATE: >2 years   PERTINENT HISTORY:  Chronic left-sided low back pain with left-sided sciatica, patient complains of pain down left buttock down through left calf. Duration: over four years PMH: recurrent PE on Eliquis, COPD, COVID, Crohn's, hyperlipidemia, hypertension, hypothyroidism, CAD, stroke, depression  SUBJECTIVE:  SUBJECTIVE STATEMENT: Patient reports her left leg continues to hurt. She states she hadn't really done her former HEP that helped at last episode of care.   She would like to get back to exercise class, and walking.   PAIN:  NPRS: 5/10 from left posterior hip down front of thigh to anteriolateral lower leg to ankle   PRECAUTIONS: Fall on Eliquis   PATIENT GOALS: to make the pain better and to be able to manage it on my own   NEXT MD VISIT: 06/2023  OBJECTIVE:   Vitals:   06/29/23 1527  BP: (!) 140/58  Pulse: 61  SpO2: 97%  Manual cuff   FUNCTIONAL TESTS:  6 minute walk test: 1100 feet with no AD, antalgic gait favoring L LE noted to improve by end of walk, reports improved pain at ankle and at left glute.   REPEATED MOTIONS TESTING:  - Prone press up, 1x10 (feels a little better at left glute).  - Prone press up, 2x10 (improving ROM, elbows nearly extend).  - standing left side glide against wall, 2x10 (reports improvement after each set, unclear if it changed from before to after set).  - standing lumbar extension over plinth, 2x10 (very good improvement in pain and gait pattern)  TODAY'S TREATMENT:   Therapeutic exercise: to centralize symptoms and improve ROM, strength, muscular endurance, and activity tolerance required for successful completion of functional activities.  - vitals check for safety/screening (see above) - 6 Minute Walk Test to test baseline (see  above) - Prone press up, 1x10 (feels a little better at left glute).  - Prone press up, 2x10 (improving ROM, elbows nearly extend).  - standing left side glide against wall, 2x10 (reports improvement after each set, unclear if it changed from before to after set).  - standing lumbar extension over plinth, 2x10 (very good improvement in pain and gait pattern) - ambulation around clinic between repeated motion bouts to assess response.  - education with trial of improved sitting posture - Education on HEP including handout   Pt required multimodal cuing for proper technique and to facilitate improved neuromuscular control, strength, range of motion, and functional ability resulting in improved performance and form.  PATIENT EDUCATION:  Education details: Exercise purpose/form. Self management techniques. Education on diagnosis, prognosis, POC, anatomy and physiology of current condition. Education on HEP including handout. Person educated: Patient Education method: Medical illustrator Education comprehension: verbalized understanding, returned demonstration, and needs further education  HOME EXERCISE PROGRAM: Access Code: [email or web address] URL: [email or web address] Date: 06/29/2023 Prepared by: Norton Blizzard  Exercises - Seated Correct Posture  - Right Standing Lateral Shift Correction at Wall - Repetitions  - 3-4 x daily - 2 sets - 10 reps - or when pain is present - Standing Lumbar Extension with Counter  - 2 x daily - 2 sets - 10 reps - 1 second hold - Prone Press Up  - 2 x daily - 2 sets - 10 reps - 1 second hold  ASSESSMENT:  CLINICAL IMPRESSION: Patient will good response to repeated motions with pain down to 2/10 (from 5/10) from glute to calf by end of session. She had significant improvement in ambulation after side glide and standing extension exercises. HEP was updated to include specific exercises to match directional preference and that lead to significant  pain relief last episode of care Patient would benefit from continued management of limiting condition by skilled physical therapist to address remaining impairments and functional limitations to work towards stated goals and return  to PLOF or maximal functional independence.   Patient is a 67 y.o. female who was seen today for physical therapy evaluation and treatment for chronic left-sided low back pain with left-sided sciatica. Patient has a history of CVA affecting the LLE as well so unsure if neurologic weakness is from CVA or low back lesion. Patient presents with weakness, impaired motor control, activity tolerance, and balance impairments. Patient has difficulty with prolonged walking/standing, sleeping, carrying, bending, etc. Patient with decreased muscle performance as seen with 5xSTS and MMT. Patient will benefit from skilled PT interventions to address current impairments to improve quality of life and overall mobility with reduction in current symptoms.    OBJECTIVE IMPAIRMENTS: Abnormal gait, cardiopulmonary status limiting activity, decreased activity tolerance, decreased balance, decreased coordination, decreased endurance, decreased mobility, decreased ROM, decreased strength, impaired flexibility, improper body mechanics, postural dysfunction, and pain.   ACTIVITY LIMITATIONS: lifting, bending, sitting, standing, and squatting  PARTICIPATION LIMITATIONS: cleaning, laundry, shopping, and community activity  PERSONAL FACTORS: Age, Behavior pattern, Past/current experiences, Time since onset of injury/illness/exacerbation, and 3+ comorbidities:  recurrent PE on Eliquis, COPD, COVID, Crohn's, hyperlipidemia, hypertension, hypothyroidism, CAD, stroke, depression  are also affecting patient's functional outcome.   REHAB POTENTIAL: Fair    CLINICAL DECISION MAKING: Stable/uncomplicated  EVALUATION COMPLEXITY: Moderate   GOALS: Goals reviewed with patient? Yes  SHORT TERM GOALS:  Target date: 07/22/2023   Patient will be independent in HEP to improve strength/mobility for better functional independence with ADLs. Baseline: 10/7: HEP initiated  Goal status: In-progress  LONG TERM GOALS: Target date: 08/19/2023   Patient will increase FOTO score to equal to or greater than 58 to demonstrate statistically significant improvement in mobility and quality of life.  Baseline: 10/7: 45 Goal status: In-progress  2.  Patient will increase six minute walk test distance by 200' for progression to more efficient community ambulator and improve gait ability Baseline: 10/7: to be tested on visit #2 Goal status: INITIAL  3.  Patient (> 11 years old) will complete five times sit to stand test in < 15 seconds indicating an increased LE strength and improved balance. Baseline: 10/7: 34.53 sec Goal status: In-progress  4.  Patient will demonstrate full lumbar AROM with no compensations or increase in pain in all planes except intermittent end range discomfort to allow patient to complete valued activities with less difficulty.  Baseline: 10/7: see above  Goal status: In-progress  5.  Patient will improve BLE strength by >0.5 MMT grade to demonstrate improved functional strength, increased standing tolerance, and increased ADL ability.   Baseline: 10/7: see above  Goal status: In-progress  6. Patient will report a worst pain of 2/10 on NRPS in low back and LLE to improve tolerance with ADLs and reduced symptoms with activities.  Baseline: 10/7: 4/10 Goal status: In-progress  PLAN:  PT FREQUENCY: 1-2x/week  PT DURATION: 8 weeks  PLANNED INTERVENTIONS: Therapeutic exercises, Therapeutic activity, Neuromuscular re-education, Balance training, Gait training, Patient/Family education, Self Care, Joint mobilization, Joint manipulation, DME instructions, Aquatic Therapy, Dry Needling, Spinal manipulation, Spinal mobilization, Cryotherapy, Moist heat, Traction, and Manual  therapy.  PLAN FOR NEXT SESSION: , HEP review, manual therapy, core strengthening    Huntley Dec R. Ilsa Iha, PT, DPT 06/29/23, 5:08 PM  [facility] [address] [place], [place] [address] P: [phone] I F: [phone]

## 2023-07-04 ENCOUNTER — Ambulatory Visit: Payer: Medicare Other | Admitting: Physical Therapy

## 2023-07-04 ENCOUNTER — Encounter: Payer: Self-pay | Admitting: Physical Therapy

## 2023-07-04 ENCOUNTER — Encounter: Payer: Self-pay | Admitting: Gastroenterology

## 2023-07-04 ENCOUNTER — Ambulatory Visit (INDEPENDENT_AMBULATORY_CARE_PROVIDER_SITE_OTHER): Payer: Medicare Other | Admitting: Gastroenterology

## 2023-07-04 ENCOUNTER — Telehealth: Payer: Self-pay

## 2023-07-04 VITALS — BP 133/70 | HR 78 | Temp 98.1°F | Ht 60.0 in | Wt 143.4 lb

## 2023-07-04 DIAGNOSIS — K219 Gastro-esophageal reflux disease without esophagitis: Secondary | ICD-10-CM

## 2023-07-04 DIAGNOSIS — G8929 Other chronic pain: Secondary | ICD-10-CM

## 2023-07-04 DIAGNOSIS — M6281 Muscle weakness (generalized): Secondary | ICD-10-CM | POA: Diagnosis not present

## 2023-07-04 DIAGNOSIS — R2681 Unsteadiness on feet: Secondary | ICD-10-CM | POA: Diagnosis not present

## 2023-07-04 DIAGNOSIS — K5010 Crohn's disease of large intestine without complications: Secondary | ICD-10-CM

## 2023-07-04 DIAGNOSIS — R262 Difficulty in walking, not elsewhere classified: Secondary | ICD-10-CM

## 2023-07-04 DIAGNOSIS — M5442 Lumbago with sciatica, left side: Secondary | ICD-10-CM | POA: Diagnosis not present

## 2023-07-04 NOTE — Therapy (Signed)
OUTPATIENT PHYSICAL THERAPY TREATMENT   Patient Name: Carolyn Campbell MRN: [id number] DOB:[birth year], 67 y.o., female Today's Date: 07/04/2023  END OF SESSION:  PT End of Session - 07/04/23 1529     Visit Number 3    Number of Visits 17    Date for PT Re-Evaluation 08/22/23    Authorization Type [employer] MEDICARE reporting period from 06/27/2023    Authorization Time Period auth 16 visits 10/7-12/2  [facility] #[id number]    Authorization - Visit Number 3    Authorization - Number of Visits 16    Progress Note Due on Visit 10    PT Start Time 1524   patient late   PT Stop Time 1600    PT Time Calculation (min) 36 min    Activity Tolerance Patient tolerated treatment well    Behavior During Therapy [facility] for tasks assessed/performed               Past Medical History:  Diagnosis Date   Abdominal pain, epigastric    Acute metabolic encephalopathy 07/22/2022   Allergic rhinitis    Arthritis    Asthma    Breast cancer (HCC) 2006   LT LUMPECTOMY   Cataract    Clotting disorder (HCC)    Cognitive deficits as late effect of cerebrovascular disease    Convulsions, unspecified convulsion type (HCC) 09/24/2022   COPD (chronic obstructive pulmonary disease) (HCC)    COVID-19 virus infection 04/2021   Crohn's disease (HCC) 05/21/2015   FOLLOWED BY GI   Crohn's disease of both small and large intestine with rectal bleeding (HCC) 12/04/2014   Depression    Currently taking zoloft.   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 05/2021 Echo: EF 60-65%, no rwma, nl RV fxn.   History of kidney stones    Hypercholesterolemia 01/18/2007   Hyperlipidemia    Hypertension    Hypothyroidism    Low back pain radiating to left leg 06/17/2021    Nonobstructive CAD (coronary artery disease)    a. 10/2012 Cath: diffuse minor irregs-->med rx; b. 08/2021 Cor CTA: Ca2+ = 55.6 (77th%'ile), LAD 25p, LCX <25p. No signif non-cardiac findings.   Occlusion, cerebral artery    NOS w/infarction   Osteoporosis    Overweight (BMI 25.0-29.9) 07/17/2022   Personal history of radiation therapy 2006   BREAST CA   Pulmonary embolism (HCC)    a.04/2021 CTA Chest: Small filling defects are noted in upper and lower lobe branches of L PA-->acute PE-->eliquis. *PE occurred in context of COVID infxn.   Stroke [facility])    Ulcer    Past Surgical History:  Procedure Laterality Date   BREAST BIOPSY Left 2006   POS   BREAST LUMPECTOMY Left 09/20/2004   positive   BREAST SURGERY Left    malignant biopsy   CARDIAC CATHETERIZATION     COLONOSCOPY  2015   COLONOSCOPY WITH PROPOFOL N/A 06/08/2017   Procedure: COLONOSCOPY WITH PROPOFOL;  Surgeon: Toney Reil, MD;  Location: [facility];  Service: Gastroenterology;  Laterality: N/A;   COLONOSCOPY WITH PROPOFOL N/A 03/08/2019   Procedure: COLONOSCOPY WITH PROPOFOL;  Surgeon: Toney Reil, MD;  Location: [facility] END[facility]COPY;  Service: Gastroenterology;  Laterality: N/A;   COLONOSCOPY WITH  PROPOFOL N/A 07/16/2020   Procedure: COLONOSCOPY WITH PROPOFOL;  Surgeon: Toney Reil, MD;  Location: [facility] ENDOSCOPY;  Service: Gastroenterology;  Laterality: N/A;   COLONOSCOPY WITH PROPOFOL N/A 09/30/2022   Procedure: COLONOSCOPY WITH PROPOFOL;  Surgeon: Toney Reil, MD;  Location: [facility] ENDOSCOPY;  Service: Gastroenterology;  Laterality: N/A;   ESOPHAGOGASTRODUODENOSCOPY  2015   ESOPHAGOGASTRODUODENOSCOPY (EGD) WITH PROPOFOL N/A 06/08/2017   Procedure: ESOPHAGOGASTRODUODENOSCOPY (EGD) WITH PROPOFOL;  Surgeon: Toney Reil, MD;  Location: [facility];  Service: Gastroenterology;  Laterality: N/A;   ESOPHAGOGASTRODUODENOSCOPY (EGD) WITH PROPOFOL N/A 03/08/2019   Procedure:  ESOPHAGOGASTRODUODENOSCOPY (EGD) WITH PROPOFOL;  Surgeon: Toney Reil, MD;  Location: [facility] ENDOSCOPY;  Service: Gastroenterology;  Laterality: N/A;   EYE SURGERY     FINGER SURGERY     Right small finger   FRACTURE SURGERY     GIVENS CAPSULE STUDY  2015   TUBAL LIGATION     Patient Active Problem List   Diagnosis Date Noted   REM behavioral disorder 03/14/2023   Excessive daytime sleepiness 03/14/2023   Crohn's disease of large intestine without complication (HCC) 09/30/2022   Convulsions, unspecified convulsion type (HCC) 09/24/2022   Encounter for current long-term use of anticoagulants 09/24/2022   MDD (major depressive disorder), recurrent episode, mild (HCC) 08/18/2022   Restless leg syndrome 08/18/2022   Pulmonary embolism (HCC) 07/16/2022   Pain in limb 11/10/2021   CAD (coronary artery disease) 08/06/2021   Glaucoma 02/23/2021   Other long term (current) drug therapy 01/22/2021   Varicose veins of both lower extremities with pain 07/19/2018   Arthritis of right hand 12/21/2017   Major depression in remission (HCC) 12/21/2017   Fibrocystic breast changes 06/22/2017   Osteopenia 10/18/2016   Atherosclerosis of aorta (HCC) 12/19/2015   Vitreous degeneration 05/21/2015   Glaucoma associated with chamber angle anomalies 05/21/2015   H/O malignant neoplasm of breast 05/21/2015   Crohn's disease of both small and large intestine with rectal bleeding (HCC) 12/04/2014   Solitary pulmonary nodule 11/07/2012   Cognitive deficits as late effect of cerebrovascular disease 01/20/2010   CVA, old, hemiparesis (HCC) 04/28/2009   Acquired hypothyroidism 06/12/2008   HLD (hyperlipidemia) 01/18/2007   Primary hypertension 01/18/2007    PCP: Danelle Berry, PA-C  REFERRING PROVIDER: Elijah Birk, MD  REFERRING DIAG:  [phone], G89.29, Chronic left sided LBP with left sided sciatica  Rationale for Evaluation and Treatment: Rehabilitation  THERAPY DIAG:  Chronic  left-sided low back pain with left-sided sciatica  Difficulty in walking, not elsewhere classified  Muscle weakness (generalized)  Unsteadiness on feet  ONSET DATE: >2 years   PERTINENT HISTORY:  Chronic left-sided low back pain with left-sided sciatica, patient complains of pain down left buttock down through left calf. Duration: over four years PMH: recurrent PE on Eliquis, COPD, COVID, Crohn's, hyperlipidemia, hypertension, hypothyroidism, CAD, stroke, depression  SUBJECTIVE:  SUBJECTIVE STATEMENT: Patient states her pain is better and the exercises and walking helps, but the pain comes back. She states she is reluctant to go back to excise because she cannot find anything that improves her pain long term. She stopped going to her exercise class because she could not find anything she could do in it without too much pain. She states is "walking through the pain."   She would like to get back to exercise class, and walking.   PAIN:  NPRS: 2/10 from left posterior hip down front of thigh to anteriolateral lower leg to ankle   PRECAUTIONS: Fall, on Eliquis   PATIENT GOALS: to make the pain better and to be able to manage it on my own   NEXT MD VISIT: 06/2023  OBJECTIVE:   TODAY'S TREATMENT:   Therapeutic exercise: to centralize symptoms and improve ROM, strength, muscular endurance, and activity tolerance required for successful completion of functional activities.  - standing left side glide against wall, 1x15. Cuing to improve ROM 10x10 (reports improvement after each set, centralized to left upper glute).  - standing lumbar extension over plinth, 1x10 (peripheralized, disconitnued) - standing left side glide against wall, 1x10. (centralized to left upper glute).  - ambulation around clinic  between repeated motion bouts to assess response.  - education about better/worse, exercise selection.  - Education on HEP including handout   Pt required multimodal cuing for proper technique and to facilitate improved neuromuscular control, strength, range of motion, and functional ability resulting in improved performance and form.  PATIENT EDUCATION:  Education details: Exercise purpose/form. Self management techniques. Education on diagnosis, prognosis, POC, anatomy and physiology of current condition. Education on HEP including handout. Person educated: Patient Education method: Medical illustrator Education comprehension: verbalized understanding, returned demonstration, and needs further education  HOME EXERCISE PROGRAM: Access Code: [email or web address] URL: [email or web address] Date: 06/29/2023 Prepared by: Norton Blizzard  Exercises - Seated Correct Posture  - Right Standing Lateral Shift Correction at Wall - Repetitions  -every 2 hours- 2 sets - 10 reps - or when pain is present  ASSESSMENT:  CLINICAL IMPRESSION: Patient arrived with improved gait pattern and lower pain severity on NPRS, but sounds discouraged about lack of ability to keep symptoms all the way gone. Patient continued to respond best to lateral repeated motions in clinic with significant improvement in symptoms by end of session. She required cuing to improve depth of ROM. Pain centralized to upper left glute with lateral glide but peripheralized again with standing lumbar extension (so it was removed from HEP for now). Patient instructed to complete 1x20 (or more if centralization continues to happen) side glides every 2 hours. Patient would benefit from continued management of limiting condition by skilled physical therapist to address remaining impairments and functional limitations to work towards stated goals and return to PLOF or maximal functional independence.   Patient is a 67 y.o. female who  was seen today for physical therapy evaluation and treatment for chronic left-sided low back pain with left-sided sciatica. Patient has a history of CVA affecting the LLE as well so unsure if neurologic weakness is from CVA or low back lesion. Patient presents with weakness, impaired motor control, activity tolerance, and balance impairments. Patient has difficulty with prolonged walking/standing, sleeping, carrying, bending, etc. Patient with decreased muscle performance as seen with 5xSTS and MMT. Patient will benefit from skilled PT interventions to address current impairments to improve quality of life and overall mobility with reduction in current symptoms.  OBJECTIVE IMPAIRMENTS: Abnormal gait, cardiopulmonary status limiting activity, decreased activity tolerance, decreased balance, decreased coordination, decreased endurance, decreased mobility, decreased ROM, decreased strength, impaired flexibility, improper body mechanics, postural dysfunction, and pain.   ACTIVITY LIMITATIONS: lifting, bending, sitting, standing, and squatting  PARTICIPATION LIMITATIONS: cleaning, laundry, shopping, and community activity  PERSONAL FACTORS: Age, Behavior pattern, Past/current experiences, Time since onset of injury/illness/exacerbation, and 3+ comorbidities:  recurrent PE on Eliquis, COPD, COVID, Crohn's, hyperlipidemia, hypertension, hypothyroidism, CAD, stroke, depression  are also affecting patient's functional outcome.   REHAB POTENTIAL: Fair    CLINICAL DECISION MAKING: Stable/uncomplicated  EVALUATION COMPLEXITY: Moderate   GOALS: Goals reviewed with patient? Yes  SHORT TERM GOALS: Target date: 07/22/2023   Patient will be independent in HEP to improve strength/mobility for better functional independence with ADLs. Baseline: 10/7: HEP initiated  Goal status: In-progress  LONG TERM GOALS: Target date: 08/19/2023   Patient will increase FOTO score to equal to or greater than 58 to  demonstrate statistically significant improvement in mobility and quality of life.  Baseline: 10/7: 45 Goal status: In-progress  2.  Patient will increase six minute walk test distance by 200' for progression to more efficient community ambulator and improve gait ability Baseline: 10/7: to be tested on visit #2 Goal status: INITIAL  3.  Patient (> 66 years old) will complete five times sit to stand test in < 15 seconds indicating an increased LE strength and improved balance. Baseline: 10/7: 34.53 sec Goal status: In-progress  4.  Patient will demonstrate full lumbar AROM with no compensations or increase in pain in all planes except intermittent end range discomfort to allow patient to complete valued activities with less difficulty.  Baseline: 10/7: see above  Goal status: In-progress  5.  Patient will improve BLE strength by >0.5 MMT grade to demonstrate improved functional strength, increased standing tolerance, and increased ADL ability.   Baseline: 10/7: see above  Goal status: In-progress  6. Patient will report a worst pain of 2/10 on NRPS in low back and LLE to improve tolerance with ADLs and reduced symptoms with activities.  Baseline: 10/7: 4/10 Goal status: In-progress  PLAN:  PT FREQUENCY: 1-2x/week  PT DURATION: 8 weeks  PLANNED INTERVENTIONS: Therapeutic exercises, Therapeutic activity, Neuromuscular re-education, Balance training, Gait training, Patient/Family education, Self Care, Joint mobilization, Joint manipulation, DME instructions, Aquatic Therapy, Dry Needling, Spinal manipulation, Spinal mobilization, Cryotherapy, Moist heat, Traction, and Manual therapy.  PLAN FOR NEXT SESSION: , HEP review, manual therapy, core strengthening    Huntley Dec R. Ilsa Iha, PT, DPT 07/04/23, 4:01 PM  [facility] [address] [place], [place] [address] P: [phone] I F: [phone]

## 2023-07-04 NOTE — Telephone Encounter (Signed)
Faxed a new start to [facility] speciality for the Entyvio injection. Also sent a email to Sherrilyn Rist to let her know it is coming her way

## 2023-07-04 NOTE — Progress Notes (Signed)
Arlyss Repress, MD [address]  [address]  [place], [place] [address]  Main: [phone]  Fax: [phone]    Gastroenterology Consultation  Referring Provider:     Danelle Berry, PA-C Primary Care Physician:  Danelle Berry, PA-C Primary Gastroenterologist:  Dr. Arlyss Repress Reason for Consultation: Crohn's colitis        HPI:   Carolyn Campbell is a 67 y.o. African-American female referred by Danelle Berry, PA-C  for consultation & management of Crohn's disease.  Patient has not seen me since May 2022.  She self discontinued Entyvio sometime late 2022 because she was she was doing so well. Since December 2023, she has been experiencing urgency, loss of appetite, rectal bleeding and loose stools secondary to exacerbation of Crohn's colitis.  Stool studies were negative for infection and her fecal calprotectin levels were significantly elevated to 1140.  Subsequently, underwent colonoscopy in 09/2022 which revealed mild chronic inactive colitis extending from sigmoid to cecum, but moderate to severe proctitis.  [name] has been restarted and she has done remarkably well, received short course of prednisone.  Patient is currently in clinical remission.  She denies any rectal urgency, rectal bleeding.  Weight has been stable.  Appetite is good.  She reports having a hard time getting an IV access with every infusion.  Most recent labs from 05/2023 were normal   Crohn's disease classification:  Age: > 40 Location: ileocolonic  Behavior: non stricturing, non penetrating  Perianal: no  IBD diagnosis: 11/2013  Disease course:Crohn's disease in 2015, initial symptoms were abdominal pain, blood in stool and on wiping, bloating. She had a colonoscopy, VCE, EGD at the time of diagnosis. She was initially on mesalamine 4 pills daily, prednisone later switched to Remicade in 11/2014. She took only 5 doses as she could not afford. Then she was switched to Imuran 50 mg daily. She had mild  flareups about twice a year. Colonoscopy 05/2017 revealed mild ileocolonic disease primarily on histology. She self discontinued Imuran. Lialda started in 06/2017.  Possible exacerbation of Crohn's disease with elevated fecal calprotectin in 10/2018.  CT abdomen and pelvis unremarkable.  Discontinued Lialda, started on azathioprine 50 mg daily.  Flareup of Crohn's in 10/2018, confirmed on colonoscopy and elevated fecal calprotectin levels.  Short prednisone course and started Entyvio on 05/23/2019 Discontinued azathioprine in 09/2019.  Normal fecal calprotectin levels, normal CRP in 09/2019.  Has been in clinical and histologic remission.  Patient self discontinued Entyvio in 06/2021 because she felt she has been doing well and did not have to take it long-term.  Resulted in exacerbation of Crohn's colitis, elevated fecal calprotectin levels >1000, colonoscopy in January 2024 revealed mild chronic active colitis in the colon, moderate to severe proctitis.  [name] has been reinitiated in January 2024  Extra intestinal manifestations: None  IBD surgical history: None No family history of IBD  Imaging:  MRE none CTE none SBFT none  Procedures: Colonoscopy 09/30/2022 DIAGNOSIS:  A. COLON, RIGHT; COLD BIOPSY:  - MILD CHRONIC INACTIVE COLITIS CONSISTENT WITH PATIENT'S KNOWN HISTORY  OF CROHN'S DISEASE.  - NEGATIVE FOR GRANULOMA, DYSPLASIA AND MALIGNANCY.   B.  COLON, LEFT; COLD BIOPSY:  - MILD CHRONIC INACTIVE COLITIS, CONSISTENT WITH PATIENT'S KNOWN HISTORY  OF CROHN'S DISEASE.  - NEGATIVE FOR GRANULOMA, DYSPLASIA OR MALIGNANCY.   C RECTUM; COLD BIOPSY:  - CHRONIC, MODERATE TO MARKEDLY ACTIVE COLITIS WITH ULCERATION AND  CRYPTITIS, CONSISTENT WITH PATIENT'S KNOWN HISTORY OF CROHN'S DISEASE.  - NEGATIVE FOR  GRANULOMA, DYSPLASIA AND MALIGNANCY.   Colonoscopy 07/16/2020 - The examined portion of the ileum was normal. - The entire examined colon is normal. Biopsied. - The distal rectum and anal  verge are normal on retroflexion view. - Simple Endoscopic Score for Crohn's Disease: 0, mucosal inflammatory changes secondary to quiescent Crohn's disease. DIAGNOSIS:  A. COLON POLYP, CECUM; COLD SNARE:  - HYPERPLASTIC POLYP.  - NEGATIVE FOR DYSPLASIA AND MALIGNANCY.   B.  COLON, RIGHT; COLD BIOPSY:  - FOCAL MILD CHRONIC INACTIVE COLITIS CONSISTENT WITH PATIENT'S KNOWN  HISTORY OF CROHN'S DISEASE.  - NEGATIVE FOR DYSPLASIA AND MALIGNANCY.   C.  COLON, LEFT; COLD BIOPSY:  - MILD CHRONIC INACTIVE COLITIS CONSISTENT WITH PATIENT'S KNOWN HISTORY  OF CROHN'S DISEASE.  - NEGATIVE FOR DYSPLASIA AND MALIGNANCY.   D.  RECTUM; COLD BIOPSY:  - UNREMARKABLE COLONIC TYPE MUCOSA.  - NEGATIVE FOR DYSPLASIA AND MALIGNANCY.   Colonoscopy : 01/28/2005, showed external hemorrhoids only Colonoscopy 12/10/2013, showed normal terminal ileum, biopsies performed. Skipped areas of nonbleeding ulcerative mucosa were seen in the entire colon, biopsies performed. 4 mm polyp in the sigmoid colon Pathology: Rectal biopsy mild-to-moderate active proctitis with architectural features of chronicity, negative for dysplasia and malignancy. Terminal ileum: Small bowel mucosa with preserved villous architecture. Right colon biopsy: No significant pathologic changes  Upper Endoscopy 12/10/2013, underwent dilation of the esophagus Ampullary biopsy: Normal, gastric biopsy: Intramucosal with erosion and mild chronic gastritis, negative for H. pylori, dysplasia and malignancy  VCE 05/06/2014: Erosions in the distal ileum  EGD and colonoscopy 03/08/2019  DIAGNOSIS:  A.  DUODENUM POLYP; COLD BIOPSY:  - DUODENAL MUCOSA WITH PROMINENT BRUNNER'S GLAND AND REACTIVE CHANGES IN  OVERLYING VILLI.  - DEEPER SECTIONS EXAMINED.  - NEGATIVE FOR ACTIVE ENTERITIS, DYSPLASIA, AND MALIGNANCY.   B.  STOMACH, RANDOM; COLD BIOPSY:  - ANTRAL MUCOSA WITH CHANGES CONSISTENT WITH ENDOSCOPIC FINDING OF  ANTRAL EROSION.  - FRAGMENTS OF  UNREMARKABLE OXYNTIC MUCOSA.  - NEGATIVE FOR H. PYLORI, DYSPLASIA, AND MALIGNANCY.   C.  TERMINAL ILEUM; COLD BIOPSY:  - UNREMARKABLE SMALL INTESTINAL MUCOSA.  - NEGATIVE FOR ACTIVE ENTERITIS, DYSPLASIA, AND MALIGNANCY.   D.  COLON, RANDOM RIGHT; COLD BIOPSY:  - MODERATE CHRONIC ACTIVE COLITIS CONSISTENT WITH PATIENT'S KNOWN  HISTORY OF CROHN'S.  - NEGATIVE FOR VIRAL CYTOPATHIC EFFECT, DYSPLASIA, AND MALIGNANCY.   E.  COLON, RANDOM TRANSVERSE; COLD BIOPSY:  - MILD CHRONIC ACTIVE COLITIS INVOLVING A MINORITY OF THE BIOPSY  FRAGMENTS.  - CHRONIC INACTIVE COLITIS INVOLVING THE MAJORITY OF THE BIOPSY  FRAGMENTS.  - NEGATIVE FOR VIRAL CYTOPATHIC EFFECT, DYSPLASIA, AND MALIGNANCY.   F.  COLON, RANDOM LEFT; COLD BIOPSY:  - PATCHY MILD CHRONIC INACTIVE COLITIS.  - NEGATIVE FOR DYSPLASIA AND MALIGNANCY.   G.  RECTUM, RANDOM PROCTITIS; COLD BIOPSY:  - MARKED CHRONIC ACTIVE PROCTITIS CONSISTENT WITH PATIENT'S KNOWN  HISTORY OF CROHN'S.  - NEGATIVE FOR VIRAL CYTOPATHIC EFFECT, DYSPLASIA, AND MALIGNANCY.  EGD 06/08/2017 Normal esophagus, small hiatal hernia, normal stomach and duodenum  Colonoscopy 06/08/2017 The perianal and digital rectal examinations were normal. Pertinent negatives include normal sphincter tone and no palpable rectal Lesions. The terminal ileum appeared normal. Biopsies were taken with a cold forceps for histology. Two scattered non-bleeding aphthae were found in the ascending colon. Rest of the colon and rectum appeared normal. Random biopsies performed   IBD medications:  Steroids: Prednisone steroid responsive, budesonide 5-ASA: mesalamine, Lialda in the past Immunomodulators: azathioprine, discontinued in 2020 TPMT status normal Biologics:  Anti TNFs: Remicade monotherapy, received  induction followed by 1 maintenance dose in 11/2014 Anti Integrins: Entyvio started on 05/23/2019 Ustekinumab: Tofactinib: Clinical trial:   Past Medical History:  Diagnosis Date    Abdominal pain, epigastric    Acute metabolic encephalopathy 07/22/2022   Allergic rhinitis    Arthritis    Asthma    Breast cancer (HCC) 2006   LT LUMPECTOMY   Cataract    Clotting disorder (HCC)    Cognitive deficits as late effect of cerebrovascular disease    Convulsions, unspecified convulsion type (HCC) 09/24/2022   COPD (chronic obstructive pulmonary disease) (HCC)    COVID-19 virus infection 04/2021   Crohn's disease (HCC) 05/21/2015   FOLLOWED BY GI   Crohn's disease of both small and large intestine with rectal bleeding (HCC) 12/04/2014   Depression    Currently taking zoloft.   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 05/2021 Echo: EF 60-65%, no rwma, nl RV fxn.   History of kidney stones    Hypercholesterolemia 01/18/2007   Hyperlipidemia    Hypertension    Hypothyroidism    Low back pain radiating to left leg 06/17/2021   Nonobstructive CAD (coronary artery disease)    a. 10/2012 Cath: diffuse minor irregs-->med rx; b. 08/2021 Cor CTA: Ca2+ = 55.6 (77th%'ile), LAD 25p, LCX <25p. No signif non-cardiac findings.   Occlusion, cerebral artery    NOS w/infarction   Osteoporosis    Overweight (BMI 25.0-29.9) 07/17/2022   Personal history of radiation therapy 2006   BREAST CA   Pulmonary embolism (HCC)    a.04/2021 CTA Chest: Small filling defects are noted in upper and lower lobe branches of L PA-->acute PE-->eliquis. *PE occurred in context of COVID infxn.   Stroke [facility])    Ulcer     Past Surgical History:  Procedure Laterality Date   BREAST BIOPSY Left 2006   POS   BREAST LUMPECTOMY Left 09/20/2004   positive   BREAST SURGERY Left    malignant biopsy   CARDIAC CATHETERIZATION     COLONOSCOPY  2015    COLONOSCOPY WITH PROPOFOL N/A 06/08/2017   Procedure: COLONOSCOPY WITH PROPOFOL;  Surgeon: Toney Reil, MD;  Location: [facility];  Service: Gastroenterology;  Laterality: N/A;   COLONOSCOPY WITH PROPOFOL N/A 03/08/2019   Procedure: COLONOSCOPY WITH PROPOFOL;  Surgeon: Toney Reil, MD;  Location: [facility] END[facility];  Service: Gastroenterology;  Laterality: N/A;   COLONOSCOPY WITH PROPOFOL N/A 07/16/2020   Procedure: COLONOSCOPY WITH PROPOFOL;  Surgeon: Toney Reil, MD;  Location: [facility] END[facility];  Service: Gastroenterology;  Laterality: N/A;   COLONOSCOPY WITH PROPOFOL N/A 09/30/2022   Procedure: COLONOSCOPY WITH PROPOFOL;  Surgeon: Toney Reil, MD;  Location: [facility] ENDOSCOPY;  Service: Gastroenterology;  Laterality: N/A;   ESOPHAGOGASTRODUODENOSCOPY  2015   ESOPHAGOGASTRODUODENOSCOPY (EGD) WITH PROPOFOL N/A 06/08/2017   Procedure: ESOPHAGOGASTRODUODENOSCOPY (EGD) WITH PROPOFOL;  Surgeon: Toney Reil, MD;  Location: [facility];  Service: Gastroenterology;  Laterality: N/A;   ESOPHAGOGASTRODUODENOSCOPY (EGD) WITH PROPOFOL N/A 03/08/2019   Procedure: ESOPHAGOGASTRODUODENOSCOPY (EGD) WITH PROPOFOL;  Surgeon: Toney Reil, MD;  Location: [facility] ENDOSCOPY;  Service: Gastroenterology;  Laterality: N/A;   EYE SURGERY     FINGER SURGERY     Right small finger  FRACTURE SURGERY     GIVENS CAPSULE STUDY  2015   TUBAL LIGATION       Current Outpatient Medications:    albuterol (VENTOLIN HFA) 108 (90 Base) MCG/ACT inhaler, Inhale 2 puffs into the lungs every 6 (six) hours as needed for wheezing or shortness of breath (chest tightness)., Disp: 8 g, Rfl: 2   apixaban (ELIQUIS) 5 MG TABS tablet, Take 1 tablet (5 mg total) by mouth 2 (two) times daily., Disp: 60 tablet, Rfl: 0   brimonidine (ALPHAGAN) 0.2 % ophthalmic solution, Place 1 drop into both eyes in the morning and at bedtime., Disp: , Rfl:    latanoprost (XALATAN) 0.005 % ophthalmic  solution, Place 1 drop into both eyes at bedtime. , Disp: , Rfl:    levothyroxine (SYNTHROID) 50 MCG tablet, Take 50 mcg po qam before breakfast 5 d a week, and take 100 mcg (2 tabs) po qam before breakfast 2 d a week, Disp: 110 tablet, Rfl: 1   losartan-hydrochlorothiazide (HYZAAR) 50-12.5 MG tablet, Take 1 tablet by mouth daily., Disp: 90 tablet, Rfl: 2   Multiple Vitamin (MULTIVITAMIN) tablet, Take 1 tablet by mouth daily., Disp: , Rfl:    rosuvastatin (CRESTOR) 40 MG tablet, Take 1 tablet (40 mg total) by mouth daily., Disp: 90 tablet, Rfl: 3   sertraline (ZOLOFT) 50 MG tablet, Take 1 tablet by mouth once daily, Disp: 90 tablet, Rfl: 1   vedolizumab (ENTYVIO) 300 MG injection, Induction Entyvio 300mg  at week 0,2, and 6 and then maintenance does every 8 weeks, Disp: 1 each, Rfl: 12   Family History  Problem Relation Age of Onset   Heart disease Mother    Heart attack Mother    Hypertension Mother    Varicose Veins Mother    Cerebrovascular Accident Father    Arthritis Father    Hypertension Father    Dementia Father    Breast cancer Sister    Cancer Sister    Breast cancer Sister 17   Cancer Sister    Cancer Sister    Hypertension Other    Diabetes Other    Heart attack Other 41   Mental illness Neg Hx      Social History   Tobacco Use   Smoking status: Never   Smokeless tobacco: Never  Vaping Use   Vaping status: Never Used  Substance Use Topics   Alcohol use: Never   Drug use: Never    Allergies as of 07/04/2023   (No Known Allergies)    Review of Systems:    All systems reviewed and negative except where noted in HPI.   Physical Exam:  BP 133/70 (BP Location: Left Arm, Patient Position: Sitting, Cuff Size: Normal)   Pulse 78   Temp 98.1 F (36.7 C) (Oral)   Ht 5' (1.524 m)   Wt 143 lb 6 oz (65 kg)   BMI 28.00 kg/m  No LMP recorded. Patient is postmenopausal.  General:   Alert,  Well-developed, well-nourished, pleasant and cooperative in NAD Head:   Normocephalic and atraumatic. Eyes:  Sclera clear, no icterus.   Conjunctiva pink. Ears:  Normal auditory acuity. Nose:  No deformity, discharge, or lesions. Mouth:  No deformity or lesions,oropharynx pink & moist. Neck:  Supple; no masses or thyromegaly. Lungs:  Respirations even and unlabored.  Clear throughout to auscultation.   No wheezes, crackles, or rhonchi. No acute distress. Heart:  Regular rate and rhythm; no murmurs, clicks, rubs, or gallops. Abdomen:  Normal bowel sounds.  No  bruits.  Soft, nontender, and non-distended without masses, hepatosplenomegaly or hernias noted.  No guarding or rebound tenderness.   Rectal: Nor performed Msk:  Symmetrical without gross deformities. Good, equal movement & strength bilaterally. Pulses:  Normal pulses noted. Extremities:  No clubbing or edema.  No cyanosis. Neurologic:  Alert and oriented x3;  grossly normal neurologically. Skin:  Intact without significant lesions or rashes. No jaundice. Psych:  Alert and cooperative. Normal mood and affect.  Imaging Studies: MRI reviewed  Assessment and Plan:   Carolyn Campbell is a 67 y.o. African-American female with Terminal ileal and colonic Crohn's, diagnosed in 2015 who was previously maintained on Imuran 50 mg daily with mild intermittent flareups about twice a year needing prednisone. She does not have any endoscopy evidence of ileocolonic Crohn's disease other than a few scattered aphthae in the ascending colon on colonoscopy in 05/2017 but histology revealed chronic mild active ileitis and right-sided colitis. MR enterography did not inflammation in small bowel. I tried to give her budesonide but she could not afford high co-pay of $500 for 30 days supply.  She stopped Imuran by herself in the past.  Recent flareup of her Crohn's disease with worsening of upper abdominal pain associated with nausea, unintentional weight loss, poor p.o. intake. Fecal calprotectin levels are elevated.  Discontinued  Lialda, currently on azathioprine 50 mg daily.  She took prednisone for 2 days only due to new onset of rash and itching.  Rash has resolved.  EGD was unremarkable.  Repeat colonoscopy revealed active Crohn's colitis, worse in the rectum with mild symptoms.  Started on Entyvio on 05/23/2019, which has resulted in clinical and histologic remission.  Stopped Entyvio in late 2022, resulted in exacerbation of Crohn's in 08/2022, restarted with reinduction and maintenance in 09/2022  Crohn's disease of large intestine, nonstricturing, nonpenetrating Previously in clinical and histologic remission followed by exacerbation in 08/2022 secondary to self discontinuation of Entyvio Colonoscopy in 09/2022 revealed moderate to severe proctitis [name] has been reinitiated in 09/2022 Currently in clinical remission Fecal calprotectin levels on 12/16/2022 were normal Recheck fecal calprotectin levels Because of difficult IV access for Entyvio, recommend switching to injectable form  Chronic GERD, well controlled EGD in 2020 was normal Currently not on PPI and asymptomatic  IBD Health Maintenance  1.TB status: Gold quantiferon negative 09/2019 2. Anemia: normal Hb, vitamin B12 and ferritin levels are normal 3.Immunizations: Hep A immune, hepatitis B immune, and annual influenza vaccine, prevnar received in 06/2017, pneumovax administered on 03/27/2019.  Received first dose of Shingrix vaccine on 04/11/2019, and booster on 01/29/2021 4.Cancer screening I) Colon cancer/dysplasia surveillance: Current scope in 09/2022, no evidence of dysplasia and no precancerous polyps identified,  II) Cervical cancer: n/a III) Skin cancer - n/a  5.Bone health Vitamin D status: Normal Bone density testing: On 09/25/2020, revealed osteopenia, repeat DEXA scan in 01/2023 revealed osteopenia Normal vitamin D levels in the past 5. Labs: Every with every other infusion 6. Smoking: Never smoked 7. NSAIDs and Antibiotics use: Denies NSAID use  and antibiotic use  Follow up in 6 months  Arlyss Repress, MD

## 2023-07-05 ENCOUNTER — Ambulatory Visit
Admission: RE | Admit: 2023-07-05 | Discharge: 2023-07-05 | Disposition: A | Discharge status: Home / Self Care | Payer: Medicare Other | Source: Ambulatory Visit | Attending: Gastroenterology

## 2023-07-05 DIAGNOSIS — K5010 Crohn's disease of large intestine without complications: Secondary | ICD-10-CM | POA: Diagnosis not present

## 2023-07-05 MED ORDER — VEDOLIZUMAB 300 MG IV SOLR
300.0000 mg | Freq: Once | INTRAVENOUS | Status: AC
  Administered 2023-07-05: 300 mg via INTRAVENOUS
  Filled 2023-07-05: qty 5

## 2023-07-06 ENCOUNTER — Ambulatory Visit: Payer: Medicare Other | Admitting: Physical Therapy

## 2023-07-07 LAB — CALPROTECTIN, FECAL: Calprotectin, Fecal: 58 ug/g (ref 0–120)

## 2023-07-11 ENCOUNTER — Ambulatory Visit: Payer: Medicare Other | Admitting: Physical Therapy

## 2023-07-11 ENCOUNTER — Telehealth: Payer: Self-pay

## 2023-07-11 NOTE — Telephone Encounter (Signed)
Submitted a PA through cover my meds for the Entyvio injection. Waiting on response from insurance company

## 2023-07-13 ENCOUNTER — Encounter: Payer: Medicare Other | Admitting: Physical Therapy

## 2023-07-15 ENCOUNTER — Other Ambulatory Visit: Payer: Self-pay | Admitting: Family Medicine

## 2023-07-15 ENCOUNTER — Ambulatory Visit (INDEPENDENT_AMBULATORY_CARE_PROVIDER_SITE_OTHER): Payer: Medicare Other

## 2023-07-15 DIAGNOSIS — Z23 Encounter for immunization: Secondary | ICD-10-CM

## 2023-07-15 DIAGNOSIS — Z1231 Encounter for screening mammogram for malignant neoplasm of breast: Secondary | ICD-10-CM

## 2023-07-18 ENCOUNTER — Encounter: Payer: Medicare Other | Admitting: Physical Therapy

## 2023-07-19 ENCOUNTER — Telehealth: Payer: Self-pay

## 2023-07-19 NOTE — Telephone Encounter (Signed)
Insurance company denied the [employer] would you like to do a appeal on the medication or like patient to continue with the entyvio infusion.   [facility] sent a fax wanted to know if we would like them to do a appeal letter for [place]. If we did we would need to send them clinicals and a copy of the denial letter to [phone]. Or we can call them at [phone].

## 2023-07-19 NOTE — Telephone Encounter (Signed)
Called patient and informed patient and patient verbalized understanding of instructions. Called [facility] and informed them to cancel the medication.

## 2023-07-20 DIAGNOSIS — G8929 Other chronic pain: Secondary | ICD-10-CM | POA: Diagnosis not present

## 2023-07-20 DIAGNOSIS — M5442 Lumbago with sciatica, left side: Secondary | ICD-10-CM | POA: Diagnosis not present

## 2023-07-20 DIAGNOSIS — M533 Sacrococcygeal disorders, not elsewhere classified: Secondary | ICD-10-CM | POA: Diagnosis not present

## 2023-07-20 DIAGNOSIS — M5416 Radiculopathy, lumbar region: Secondary | ICD-10-CM | POA: Diagnosis not present

## 2023-07-21 ENCOUNTER — Encounter: Payer: Medicare Other | Admitting: Physical Therapy

## 2023-07-23 LAB — MICROALBUMIN / CREATININE URINE RATIO: Microalb Creat Ratio: 300

## 2023-07-26 ENCOUNTER — Encounter: Payer: Medicare Other | Admitting: Physical Therapy

## 2023-07-27 ENCOUNTER — Ambulatory Visit
Admission: RE | Admit: 2023-07-27 | Discharge: 2023-07-27 | Disposition: A | Discharge status: Home / Self Care | Payer: Medicare Other | Source: Ambulatory Visit | Attending: Family Medicine | Admitting: Family Medicine

## 2023-07-27 DIAGNOSIS — Z1231 Encounter for screening mammogram for malignant neoplasm of breast: Secondary | ICD-10-CM | POA: Insufficient documentation

## 2023-07-28 ENCOUNTER — Encounter: Payer: Medicare Other | Admitting: Physical Therapy

## 2023-08-01 ENCOUNTER — Ambulatory Visit: Payer: Medicare Other | Attending: Physical Medicine & Rehabilitation | Admitting: Physical Therapy

## 2023-08-01 ENCOUNTER — Telehealth: Payer: Self-pay

## 2023-08-01 ENCOUNTER — Encounter: Payer: Self-pay | Admitting: Physical Therapy

## 2023-08-01 ENCOUNTER — Encounter: Payer: Self-pay | Admitting: Family Medicine

## 2023-08-01 DIAGNOSIS — H04123 Dry eye syndrome of bilateral lacrimal glands: Secondary | ICD-10-CM | POA: Diagnosis not present

## 2023-08-01 DIAGNOSIS — H02834 Dermatochalasis of left upper eyelid: Secondary | ICD-10-CM | POA: Diagnosis not present

## 2023-08-01 DIAGNOSIS — G8929 Other chronic pain: Secondary | ICD-10-CM | POA: Insufficient documentation

## 2023-08-01 DIAGNOSIS — H35371 Puckering of macula, right eye: Secondary | ICD-10-CM | POA: Diagnosis not present

## 2023-08-01 DIAGNOSIS — H401132 Primary open-angle glaucoma, bilateral, moderate stage: Secondary | ICD-10-CM | POA: Diagnosis not present

## 2023-08-01 DIAGNOSIS — M6281 Muscle weakness (generalized): Secondary | ICD-10-CM | POA: Insufficient documentation

## 2023-08-01 DIAGNOSIS — H43813 Vitreous degeneration, bilateral: Secondary | ICD-10-CM | POA: Diagnosis not present

## 2023-08-01 DIAGNOSIS — R262 Difficulty in walking, not elsewhere classified: Secondary | ICD-10-CM | POA: Insufficient documentation

## 2023-08-01 DIAGNOSIS — R2681 Unsteadiness on feet: Secondary | ICD-10-CM | POA: Insufficient documentation

## 2023-08-01 DIAGNOSIS — I10 Essential (primary) hypertension: Secondary | ICD-10-CM

## 2023-08-01 DIAGNOSIS — M5442 Lumbago with sciatica, left side: Secondary | ICD-10-CM | POA: Insufficient documentation

## 2023-08-01 DIAGNOSIS — H02831 Dermatochalasis of right upper eyelid: Secondary | ICD-10-CM | POA: Diagnosis not present

## 2023-08-01 DIAGNOSIS — Z961 Presence of intraocular lens: Secondary | ICD-10-CM | POA: Diagnosis not present

## 2023-08-01 NOTE — Telephone Encounter (Signed)
Patient states she receives her Entyvio [facility]

## 2023-08-01 NOTE — Therapy (Signed)
OUTPATIENT PHYSICAL THERAPY TREATMENT   Patient Name: Carolyn Campbell MRN: [id number] DOB:08-Jun-1956, 67 y.o., female Today's Date: 08/02/2023  END OF SESSION:  PT End of Session - 08/02/23 2027     Visit Number 4    Number of Visits 17    Date for PT Re-Evaluation 08/22/23    Authorization Type [employer] MEDICARE reporting period from 06/27/2023    Authorization Time Period auth 16 visits 10/7-12/2  [facility] #[id number]    Authorization - Visit Number 4    Authorization - Number of Visits 16    Progress Note Due on Visit 10    PT Start Time 1118    PT Stop Time 1158    PT Time Calculation (min) 40 min    Activity Tolerance Patient tolerated treatment well    Behavior During Therapy [facility] for tasks assessed/performed                 Past Medical History:  Diagnosis Date   Abdominal pain, epigastric    Acute metabolic encephalopathy 07/22/2022   Allergic rhinitis    Arthritis    Asthma    Breast cancer (HCC) 2006   LT LUMPECTOMY   Cataract    Clotting disorder (HCC)    Cognitive deficits as late effect of cerebrovascular disease    Convulsions, unspecified convulsion type (HCC) 09/24/2022   COPD (chronic obstructive pulmonary disease) (HCC)    COVID-19 virus infection 04/2021   Crohn's disease (HCC) 05/21/2015   FOLLOWED BY GI   Crohn's disease of both small and large intestine with rectal bleeding (HCC) 12/04/2014   Depression    Currently taking zoloft.   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 05/2021 Echo: EF 60-65%, no rwma, nl RV fxn.   History of kidney stones    Hypercholesterolemia 01/18/2007   Hyperlipidemia    Hypertension    Hypothyroidism    Low back pain radiating to left leg 06/17/2021   Nonobstructive CAD  (coronary artery disease)    a. 10/2012 Cath: diffuse minor irregs-->med rx; b. 08/2021 Cor CTA: Ca2+ = 55.6 (77th%'ile), LAD 25p, LCX <25p. No signif non-cardiac findings.   Occlusion, cerebral artery    NOS w/infarction   Osteoporosis    Overweight (BMI 25.0-29.9) 07/17/2022   Personal history of radiation therapy 2006   BREAST CA   Pulmonary embolism (HCC)    a.04/2021 CTA Chest: Small filling defects are noted in upper and lower lobe branches of L PA-->acute PE-->eliquis. *PE occurred in context of COVID infxn.   Stroke [facility])    Ulcer    Past Surgical History:  Procedure Laterality Date   BREAST BIOPSY Left 2006   POS   BREAST LUMPECTOMY Left 09/20/2004   positive   BREAST SURGERY Left    malignant biopsy   CARDIAC CATHETERIZATION     COLONOSCOPY  2015   COLONOSCOPY WITH PROPOFOL N/A 06/08/2017   Procedure: COLONOSCOPY WITH PROPOFOL;  Surgeon: Toney Reil, MD;  Location: [facility];  Service: Gastroenterology;  Laterality: N/A;   COLONOSCOPY WITH PROPOFOL N/A 03/08/2019   Procedure: COLONOSCOPY WITH PROPOFOL;  Surgeon: Toney Reil, MD;  Location: [facility] ENDOSCOPY;  Service: Gastroenterology;  Laterality: N/A;   COLONOSCOPY WITH PROPOFOL  N/A 07/16/2020   Procedure: COLONOSCOPY WITH PROPOFOL;  Surgeon: Toney Reil, MD;  Location: [facility] ENDOSCOPY;  Service: Gastroenterology;  Laterality: N/A;   COLONOSCOPY WITH PROPOFOL N/A 09/30/2022   Procedure: COLONOSCOPY WITH PROPOFOL;  Surgeon: Toney Reil, MD;  Location: [facility] ENDOSCOPY;  Service: Gastroenterology;  Laterality: N/A;   ESOPHAGOGASTRODUODENOSCOPY  2015   ESOPHAGOGASTRODUODENOSCOPY (EGD) WITH PROPOFOL N/A 06/08/2017   Procedure: ESOPHAGOGASTRODUODENOSCOPY (EGD) WITH PROPOFOL;  Surgeon: Toney Reil, MD;  Location: [facility];  Service: Gastroenterology;  Laterality: N/A;   ESOPHAGOGASTRODUODENOSCOPY (EGD) WITH PROPOFOL N/A 03/08/2019   Procedure: ESOPHAGOGASTRODUODENOSCOPY  (EGD) WITH PROPOFOL;  Surgeon: Toney Reil, MD;  Location: [facility] ENDOSCOPY;  Service: Gastroenterology;  Laterality: N/A;   EYE SURGERY     FINGER SURGERY     Right small finger   FRACTURE SURGERY     GIVENS CAPSULE STUDY  2015   TUBAL LIGATION     Patient Active Problem List   Diagnosis Date Noted   REM behavioral disorder 03/14/2023   Excessive daytime sleepiness 03/14/2023   Crohn's disease of large intestine without complication (HCC) 09/30/2022   Convulsions, unspecified convulsion type (HCC) 09/24/2022   Encounter for current long-term use of anticoagulants 09/24/2022   MDD (major depressive disorder), recurrent episode, mild (HCC) 08/18/2022   Restless leg syndrome 08/18/2022   Pulmonary embolism (HCC) 07/16/2022   Pain in limb 11/10/2021   CAD (coronary artery disease) 08/06/2021   Glaucoma 02/23/2021   Other long term (current) drug therapy 01/22/2021   Varicose veins of both lower extremities with pain 07/19/2018   Arthritis of right hand 12/21/2017   Major depression in remission (HCC) 12/21/2017   Fibrocystic breast changes 06/22/2017   Osteopenia 10/18/2016   Atherosclerosis of aorta (HCC) 12/19/2015   Vitreous degeneration 05/21/2015   Glaucoma associated with chamber angle anomalies 05/21/2015   H/O malignant neoplasm of breast 05/21/2015   Crohn's disease of both small and large intestine with rectal bleeding (HCC) 12/04/2014   Solitary pulmonary nodule 11/07/2012   Cognitive deficits as late effect of cerebrovascular disease 01/20/2010   CVA, old, hemiparesis (HCC) 04/28/2009   Acquired hypothyroidism 06/12/2008   HLD (hyperlipidemia) 01/18/2007   Primary hypertension 01/18/2007    PCP: Danelle Berry, PA-C  REFERRING PROVIDER: Elijah Birk, MD  REFERRING DIAG:  [phone], G89.29, Chronic left sided LBP with left sided sciatica  Rationale for Evaluation and Treatment: Rehabilitation  THERAPY DIAG:  Chronic left-sided low back pain with  left-sided sciatica  Difficulty in walking, not elsewhere classified  Muscle weakness (generalized)  Unsteadiness on feet  ONSET DATE: >2 years   PERTINENT HISTORY:  Chronic left-sided low back pain with left-sided sciatica, patient complains of pain down left buttock down through left calf. Duration: over four years PMH: recurrent PE on Eliquis, COPD, COVID, Crohn's, hyperlipidemia, hypertension, hypothyroidism, CAD, stroke, depression  SUBJECTIVE:  SUBJECTIVE STATEMENT: Patient states her husband had a knee replacement, which is why she has not been to PT since 07/04/2023. She also has eye surgery on 08/15/2023. Her left pain is hard for her to explain. She states it feels okay right now (little pain) and not like it was before. She rates it 1-2/10 at the left glute and lateral thigh. She gets frustrated because it seems like the pain just keeps returning. She tates she has been doing the lateral glide only from HEP (which is as prescribed). She states she has been doing it once per day (prescribed at every 2 hours) because she has been so busy helping her husband she has had trouble finding time to do things to care for herself. He is doing better now. She states the side glide exercise makes the pain better for about half the day before it comes back. She did do it every 2 hours for about 2 days right after her last PT session, but she cannot remember what effect on the pain it had then. She got busy and only did the exercise when her pain got really bad. She was put on gabapentin, but that did not help (just made her sleepy. She is unsure what she wants to do with PT. She does not want to take shots, but Dr. Mariah Milling has suggested it if the gabapentin does not help.   Patient states she has been walking some,  but she does not feel ready to go back to the exercise class. She states walking is okay. She can sort of feel her pain when going up a hill, but otherwise it does not make it worse to walk.   She would like to get back to exercise class, and walking.   PAIN:  NPRS: 2/10 from left posterior hip down front of thigh to anteriolateral lower leg to ankle   PRECAUTIONS: Fall, on Eliquis   PATIENT GOALS: to make the pain better and to be able to manage it on my own   NEXT MD VISIT: 06/2023  OBJECTIVE:   TODAY'S TREATMENT:   Therapeutic exercise: to centralize symptoms and improve ROM, strength, muscular endurance, and activity tolerance required for successful completion of functional activities.  - standing left side glide against wall, 3x20-30 Cuing to improve ROM and for arm placement (centralized to left upper glute after first 2 sets, then peripheralized to left thigh again).  - standing lumbar extension over plinth, 1x10 (peripheralized to calf, disconitnued) - standing left side glide against wall, 1x10. (No change).  - ambulation around clinic between repeated motion bouts to assess response.   Manual therapy: to reduce pain and tissue tension, improve range of motion, neuromodulation, in order to promote improved ability to complete functional activities. - standing manual lateral shift correction (pushing towards left), 1x10 (worse) - sidelying left SIJ rotational mobilization grade II-IV followed by HVLA manipulation x2 (no cavitation). Felt significantly better following.   Pt required multimodal cuing for proper technique and to facilitate improved neuromuscular control, strength, range of motion, and functional ability resulting in improved performance and form.  PATIENT EDUCATION:  Education details: Exercise purpose/form. Self management techniques. Education on diagnosis, prognosis, POC, anatomy and physiology of current condition. Education on HEP including handout. Person  educated: Patient Education method: Medical illustrator Education comprehension: verbalized understanding, returned demonstration, and needs further education  HOME EXERCISE PROGRAM: Access Code: [email or web address] URL: [email or web address] Date: 06/29/2023 Prepared by: Norton Blizzard  Exercises - Seated Correct Posture  - Right  Standing Lateral Shift Correction at Wall - Repetitions  -every 2 hours- 2 sets - 10 reps - or when pain is present  ASSESSMENT:  CLINICAL IMPRESSION: Patient arrived reporting poor HEP participation with continued intermittent pain. She did do her HEP every 2 hours as prescribed for two days, but could not remember the effect. She is returning to PT after 3.5 weeks away while she cared for her husband's needs after he had TKA. She seemed ambivalent about continuing PT upon arrival to her appointment today. She had some improvement with lateral glide, but pain seemed to peripheralized again with further repetitions and progression to manual lateral shift correction. Lumbar rotational mobilization/HVLA thrust targeting left SIJ at end of session significantly improved patient's pain. Plan to explore this further next session with possible repeat of maneuver as well as exercises to improve SIJ stabilization. Patient would benefit from continued management of limiting condition by skilled physical therapist to address remaining impairments and functional limitations to work towards stated goals and return to PLOF or maximal functional independence.    OBJECTIVE IMPAIRMENTS: Abnormal gait, cardiopulmonary status limiting activity, decreased activity tolerance, decreased balance, decreased coordination, decreased endurance, decreased mobility, decreased ROM, decreased strength, impaired flexibility, improper body mechanics, postural dysfunction, and pain.   ACTIVITY LIMITATIONS: lifting, bending, sitting, standing, and squatting  PARTICIPATION LIMITATIONS:  cleaning, laundry, shopping, and community activity  PERSONAL FACTORS: Age, Behavior pattern, Past/current experiences, Time since onset of injury/illness/exacerbation, and 3+ comorbidities:  recurrent PE on Eliquis, COPD, COVID, Crohn's, hyperlipidemia, hypertension, hypothyroidism, CAD, stroke, depression  are also affecting patient's functional outcome.   REHAB POTENTIAL: Fair    CLINICAL DECISION MAKING: Stable/uncomplicated  EVALUATION COMPLEXITY: Moderate   GOALS: Goals reviewed with patient? Yes  SHORT TERM GOALS: Target date: 07/22/2023   Patient will be independent in HEP to improve strength/mobility for better functional independence with ADLs. Baseline: 10/7: HEP initiated  Goal status: In-progress  LONG TERM GOALS: Target date: 08/19/2023   Patient will increase FOTO score to equal to or greater than 58 to demonstrate statistically significant improvement in mobility and quality of life.  Baseline: 10/7: 45 Goal status: In-progress  2.  Patient will increase six minute walk test distance by 200' for progression to more efficient community ambulator and improve gait ability Baseline: 10/7: to be tested on visit #2 Goal status: INITIAL  3.  Patient (> 4 years old) will complete five times sit to stand test in < 15 seconds indicating an increased LE strength and improved balance. Baseline: 10/7: 34.53 sec Goal status: In-progress  4.  Patient will demonstrate full lumbar AROM with no compensations or increase in pain in all planes except intermittent end range discomfort to allow patient to complete valued activities with less difficulty.  Baseline: 10/7: see above  Goal status: In-progress  5.  Patient will improve BLE strength by >0.5 MMT grade to demonstrate improved functional strength, increased standing tolerance, and increased ADL ability.   Baseline: 10/7: see above  Goal status: In-progress  6. Patient will report a worst pain of 2/10 on NRPS in low back  and LLE to improve tolerance with ADLs and reduced symptoms with activities.  Baseline: 10/7: 4/10 Goal status: In-progress  PLAN:  PT FREQUENCY: 1-2x/week  PT DURATION: 8 weeks  PLANNED INTERVENTIONS: Therapeutic exercises, Therapeutic activity, Neuromuscular re-education, Balance training, Gait training, Patient/Family education, Self Care, Joint mobilization, Joint manipulation, DME instructions, Aquatic Therapy, Dry Needling, Spinal manipulation, Spinal mobilization, Cryotherapy, Moist heat, Traction, and Manual therapy.  PLAN  FOR NEXT SESSION: interventions for SIJ? Repeated motions.    Luretha Murphy. Ilsa Iha, PT, DPT 08/02/23, 8:37 PM  [facility] [address] [place], [place] [address] P: [phone] I F: [phone]

## 2023-08-02 MED ORDER — LOSARTAN POTASSIUM-HCTZ 50-12.5 MG PO TABS
1.0000 | ORAL_TABLET | Freq: Every day | ORAL | 1 refills | Status: AC
Start: 2023-08-02 — End: ?

## 2023-08-03 DIAGNOSIS — M533 Sacrococcygeal disorders, not elsewhere classified: Secondary | ICD-10-CM | POA: Diagnosis not present

## 2023-08-03 DIAGNOSIS — G8929 Other chronic pain: Secondary | ICD-10-CM | POA: Diagnosis not present

## 2023-08-03 DIAGNOSIS — M5416 Radiculopathy, lumbar region: Secondary | ICD-10-CM | POA: Diagnosis not present

## 2023-08-03 DIAGNOSIS — M5442 Lumbago with sciatica, left side: Secondary | ICD-10-CM | POA: Diagnosis not present

## 2023-08-08 ENCOUNTER — Encounter: Payer: Self-pay | Admitting: Physical Therapy

## 2023-08-08 ENCOUNTER — Ambulatory Visit: Payer: Medicare Other | Admitting: Physical Therapy

## 2023-08-08 DIAGNOSIS — R2681 Unsteadiness on feet: Secondary | ICD-10-CM | POA: Diagnosis not present

## 2023-08-08 DIAGNOSIS — M6281 Muscle weakness (generalized): Secondary | ICD-10-CM

## 2023-08-08 DIAGNOSIS — G8929 Other chronic pain: Secondary | ICD-10-CM

## 2023-08-08 DIAGNOSIS — R262 Difficulty in walking, not elsewhere classified: Secondary | ICD-10-CM

## 2023-08-08 DIAGNOSIS — M5442 Lumbago with sciatica, left side: Secondary | ICD-10-CM | POA: Diagnosis not present

## 2023-08-08 NOTE — Therapy (Signed)
OUTPATIENT PHYSICAL THERAPY TREATMENT   Patient Name: Carolyn Campbell MRN: [id number] DOB:[birth year], 67 y.o., female Today's Date: 08/08/2023  END OF SESSION:  PT End of Session - 08/08/23 1127     Visit Number 5    Number of Visits 17    Date for PT Re-Evaluation 08/22/23    Authorization Type [employer] MEDICARE reporting period from 06/27/2023    Authorization Time Period auth 16 visits 10/7-12/2  [facility] #[id number]    Authorization - Visit Number 5    Authorization - Number of Visits 16    Progress Note Due on Visit 10    PT Start Time 1120    PT Stop Time 1200    PT Time Calculation (min) 40 min    Activity Tolerance Patient tolerated treatment well    Behavior During Therapy [facility] for tasks assessed/performed                  Past Medical History:  Diagnosis Date   Abdominal pain, epigastric    Acute metabolic encephalopathy 07/22/2022   Allergic rhinitis    Arthritis    Asthma    Breast cancer (HCC) 2006   LT LUMPECTOMY   Cataract    Clotting disorder (HCC)    Cognitive deficits as late effect of cerebrovascular disease    Convulsions, unspecified convulsion type (HCC) 09/24/2022   COPD (chronic obstructive pulmonary disease) (HCC)    COVID-19 virus infection 04/2021   Crohn's disease (HCC) 05/21/2015   FOLLOWED BY GI   Crohn's disease of both small and large intestine with rectal bleeding (HCC) 12/04/2014   Depression    Currently taking zoloft.   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 05/2021 Echo: EF 60-65%, no rwma, nl RV fxn.   History of kidney stones    Hypercholesterolemia 01/18/2007   Hyperlipidemia    Hypertension    Hypothyroidism    Low back pain radiating to left leg 06/17/2021   Nonobstructive CAD  (coronary artery disease)    a. 10/2012 Cath: diffuse minor irregs-->med rx; b. 08/2021 Cor CTA: Ca2+ = 55.6 (77th%'ile), LAD 25p, LCX <25p. No signif non-cardiac findings.   Occlusion, cerebral artery    NOS w/infarction   Osteoporosis    Overweight (BMI 25.0-29.9) 07/17/2022   Personal history of radiation therapy 2006   BREAST CA   Pulmonary embolism (HCC)    a.04/2021 CTA Chest: Small filling defects are noted in upper and lower lobe branches of L PA-->acute PE-->eliquis. *PE occurred in context of COVID infxn.   Stroke [facility])    Ulcer    Past Surgical History:  Procedure Laterality Date   BREAST BIOPSY Left 2006   POS   BREAST LUMPECTOMY Left 09/20/2004   positive   BREAST SURGERY Left    malignant biopsy   CARDIAC CATHETERIZATION     COLONOSCOPY  2015   COLONOSCOPY WITH PROPOFOL N/A 06/08/2017   Procedure: COLONOSCOPY WITH PROPOFOL;  Surgeon: Toney Reil, MD;  Location: [facility];  Service: Gastroenterology;  Laterality: N/A;   COLONOSCOPY WITH PROPOFOL N/A 03/08/2019   Procedure: COLONOSCOPY WITH PROPOFOL;  Surgeon: Toney Reil, MD;  Location: [facility] ENDOSCOPY;  Service: Gastroenterology;  Laterality: N/A;   COLONOSCOPY WITH  PROPOFOL N/A 07/16/2020   Procedure: COLONOSCOPY WITH PROPOFOL;  Surgeon: Toney Reil, MD;  Location: [facility] ENDOSCOPY;  Service: Gastroenterology;  Laterality: N/A;   COLONOSCOPY WITH PROPOFOL N/A 09/30/2022   Procedure: COLONOSCOPY WITH PROPOFOL;  Surgeon: Toney Reil, MD;  Location: [facility] ENDOSCOPY;  Service: Gastroenterology;  Laterality: N/A;   ESOPHAGOGASTRODUODENOSCOPY  2015   ESOPHAGOGASTRODUODENOSCOPY (EGD) WITH PROPOFOL N/A 06/08/2017   Procedure: ESOPHAGOGASTRODUODENOSCOPY (EGD) WITH PROPOFOL;  Surgeon: Toney Reil, MD;  Location: [facility] SUR[facility];  Service: Gastroenterology;  Laterality: N/A;   ESOPHAGOGASTRODUODENOSCOPY (EGD) WITH PROPOFOL N/A 03/08/2019   Procedure: ESOPHAGOGASTRODUODENOSCOPY  (EGD) WITH PROPOFOL;  Surgeon: Toney Reil, MD;  Location: [facility] ENDOSCOPY;  Service: Gastroenterology;  Laterality: N/A;   EYE SURGERY     FINGER SURGERY     Right small finger   FRACTURE SURGERY     GIVENS CAPSULE STUDY  2015   TUBAL LIGATION     Patient Active Problem List   Diagnosis Date Noted   REM behavioral disorder 03/14/2023   Excessive daytime sleepiness 03/14/2023   Crohn's disease of large intestine without complication (HCC) 09/30/2022   Convulsions, unspecified convulsion type (HCC) 09/24/2022   Encounter for current long-term use of anticoagulants 09/24/2022   MDD (major depressive disorder), recurrent episode, mild (HCC) 08/18/2022   Restless leg syndrome 08/18/2022   Pulmonary embolism (HCC) 07/16/2022   Pain in limb 11/10/2021   CAD (coronary artery disease) 08/06/2021   Glaucoma 02/23/2021   Other long term (current) drug therapy 01/22/2021   Varicose veins of both lower extremities with pain 07/19/2018   Arthritis of right hand 12/21/2017   Major depression in remission (HCC) 12/21/2017   Fibrocystic breast changes 06/22/2017   Osteopenia 10/18/2016   Atherosclerosis of aorta (HCC) 12/19/2015   Vitreous degeneration 05/21/2015   Glaucoma associated with chamber angle anomalies 05/21/2015   H/O malignant neoplasm of breast 05/21/2015   Crohn's disease of both small and large intestine with rectal bleeding (HCC) 12/04/2014   Solitary pulmonary nodule 11/07/2012   Cognitive deficits as late effect of cerebrovascular disease 01/20/2010   CVA, old, hemiparesis (HCC) 04/28/2009   Acquired hypothyroidism 06/12/2008   HLD (hyperlipidemia) 01/18/2007   Primary hypertension 01/18/2007    PCP: Danelle Berry, PA-C  REFERRING PROVIDER: Elijah Birk, MD  REFERRING DIAG:  [phone], G89.29, Chronic left sided LBP with left sided sciatica  Rationale for Evaluation and Treatment: Rehabilitation  THERAPY DIAG:  Chronic left-sided low back pain with  left-sided sciatica  Difficulty in walking, not elsewhere classified  Muscle weakness (generalized)  Unsteadiness on feet  ONSET DATE: >2 years   PERTINENT HISTORY:  Chronic left-sided low back pain with left-sided sciatica, patient complains of pain down left buttock down through left calf. Duration: over four years PMH: recurrent PE on Eliquis, COPD, COVID, Crohn's, hyperlipidemia, hypertension, hypothyroidism, CAD, stroke, depression  SUBJECTIVE:  SUBJECTIVE STATEMENT: Patient states her leg is feeling pretty good today. She states after last PT session her leg had some bad times, but she thinks what she did at last PT session helped her. She states her pain only bothers her if she does something for a long time. She states her leg has been a lot better.   She would like to get back to exercise class, and walking.   Exercise classes: arm weights, bands, balance, 15 min aerobics. She stopped because her leg kept hurting and she could not complete it.   PAIN:  NPRS: 0/10   PRECAUTIONS: Fall, on Eliquis   PATIENT GOALS: to make the pain better and to be able to manage it on my own   NEXT MD VISIT: 06/2023  OBJECTIVE:   TODAY'S TREATMENT:   Therapeutic exercise: to centralize symptoms and improve ROM, strength, muscular endurance, and activity tolerance required for successful completion of functional activities.  - Treadmill 2 mph at 0% grade with B UE support. For improved lower extremity mobility, muscular endurance, and weightbearing activity tolerance; and to induce the analgesic effect of aerobic exercise, stimulate improved joint nutrition, and prepare body structures and systems for following interventions. x 5  minutes. Required assistance to set up. (Therapeutic activities - see  below) - sidelying reverse clam shell, 3x10 each side with GTB looped around ankles.  - Education on HEP including handout   Therapeutic activities: for functional strengthening and improved functional activity tolerance. - runner's step up, 3x10 each side to 8 inch step with light U UE support. 5#AW added to lifting leg 2nd set, 5#AW on both ankles 3rd set. Unable to balance while holding weight.  - seated B hip IR/ER measurement (grossly equal bilaterally both directions ~ 45 degrees).   Superset:  - lateral stepping with band around ankles, 2x30 feet each direction with GTB/BlackTB, SBA - forwards/backwards monster walks with band around ankles, 2x30 feet  direction with GTB/BlackTB, SBA  - lateral stepping AROM 1x30 feet each direction focusing on keeping toes forwards, SBA - forwards/backwards monster walks AROM, 1x30 feet each direction focusing on keeping toes forwards, SBA    Pt required multimodal cuing for proper technique and to facilitate improved neuromuscular control, strength, range of motion, and functional ability resulting in improved performance and form.  PATIENT EDUCATION:  Education details: Exercise purpose/form. Self management techniques. Education on diagnosis, prognosis, POC, anatomy and physiology of current condition. Education on HEP including handout. Person educated: Patient Education method: Medical illustrator Education comprehension: verbalized understanding, returned demonstration, and needs further education  HOME EXERCISE PROGRAM: Access Code: [email or web address] URL: [email or web address] Date: 06/29/2023 Prepared by: Norton Blizzard  Exercises - Seated Correct Posture  - Right Standing Lateral Shift Correction at Wall - Repetitions  -every 2 hours- 2 sets - 10 reps - or when pain is present  HOME EXERCISE PROGRAM [[id number]] View at [email or web address]" using code: [email or web address] SIDE LYING REVERSE CLAM SHELL - ELASTIC BAND - REVERSE  CLAMSHELL -  Repeat 10 Repetitions, Hold 1 Second(s), Complete 3 Sets, Perform 3 Times a Week  LATERAL SIDE STEPS -  Duration 60 Seconds, Complete 3 Sets, Perform 3 Times a Week  Monster Walk -  Duration 60 Seconds, Complete 3 Sets, Perform 3 Times a Week  ASSESSMENT:  CLINICAL IMPRESSION: Patient arrives with significant improvement in pain control. Today's session focused on functional/stability exercises for B hips and SIJ to improve return to function without re-provoking pain. Patient had difficulty maintaining toes forwards while performing  banded walking exercises, but was found to have approx equal B hip IR/ER in 90 degrees flexion (seated in dependent position). Plan to advance banded exercises to be less resistance but increased focus on form with toes forwards. Patient tolerated session well with no increase in pain. Patient would benefit from continued management of limiting condition by skilled physical therapist to address remaining impairments and functional limitations to work towards stated goals and return to PLOF or maximal functional independence.   OBJECTIVE IMPAIRMENTS: Abnormal gait, cardiopulmonary status limiting activity, decreased activity tolerance, decreased balance, decreased coordination, decreased endurance, decreased mobility, decreased ROM, decreased strength, impaired flexibility, improper body mechanics, postural dysfunction, and pain.   ACTIVITY LIMITATIONS: lifting, bending, sitting, standing, and squatting  PARTICIPATION LIMITATIONS: cleaning, laundry, shopping, and community activity  PERSONAL FACTORS: Age, Behavior pattern, Past/current experiences, Time since onset of injury/illness/exacerbation, and 3+ comorbidities:  recurrent PE on Eliquis, COPD, COVID, Crohn's, hyperlipidemia, hypertension, hypothyroidism, CAD, stroke, depression  are also affecting patient's functional outcome.   REHAB POTENTIAL: Fair    CLINICAL DECISION MAKING:  Stable/uncomplicated  EVALUATION COMPLEXITY: Moderate   GOALS: Goals reviewed with patient? Yes  SHORT TERM GOALS: Target date: 07/22/2023   Patient will be independent in HEP to improve strength/mobility for better functional independence with ADLs. Baseline: 10/7: HEP initiated  Goal status: In-progress  LONG TERM GOALS: Target date: 08/19/2023   Patient will increase FOTO score to equal to or greater than 58 to demonstrate statistically significant improvement in mobility and quality of life.  Baseline: 10/7: 45 Goal status: In-progress  2.  Patient will increase six minute walk test distance by 200' for progression to more efficient community ambulator and improve gait ability Baseline: 10/7: to be tested on visit #2 Goal status: INITIAL  3.  Patient (> 32 years old) will complete five times sit to stand test in < 15 seconds indicating an increased LE strength and improved balance. Baseline: 10/7: 34.53 sec Goal status: In-progress  4.  Patient will demonstrate full lumbar AROM with no compensations or increase in pain in all planes except intermittent end range discomfort to allow patient to complete valued activities with less difficulty.  Baseline: 10/7: see above  Goal status: In-progress  5.  Patient will improve BLE strength by >0.5 MMT grade to demonstrate improved functional strength, increased standing tolerance, and increased ADL ability.   Baseline: 10/7: see above  Goal status: In-progress  6. Patient will report a worst pain of 2/10 on NRPS in low back and LLE to improve tolerance with ADLs and reduced symptoms with activities.  Baseline: 10/7: 4/10 Goal status: In-progress  PLAN:  PT FREQUENCY: 1-2x/week  PT DURATION: 8 weeks  PLANNED INTERVENTIONS: Therapeutic exercises, Therapeutic activity, Neuromuscular re-education, Balance training, Gait training, Patient/Family education, Self Care, Joint mobilization, Joint manipulation, DME instructions,  Aquatic Therapy, Dry Needling, Spinal manipulation, Spinal mobilization, Cryotherapy, Moist heat, Traction, and Manual therapy.  PLAN FOR NEXT SESSION: interventions for SIJ? Repeated motions.    Luretha Murphy. Ilsa Iha, PT, DPT 08/08/23, 12:28 PM  [facility] [address] [place], [place] [address] P: [phone] I F: [phone]

## 2023-08-11 ENCOUNTER — Encounter: Payer: Medicare Other | Admitting: Physical Therapy

## 2023-08-15 ENCOUNTER — Encounter: Payer: Medicare Other | Admitting: Physical Therapy

## 2023-08-15 DIAGNOSIS — H57813 Brow ptosis, bilateral: Secondary | ICD-10-CM | POA: Diagnosis not present

## 2023-08-15 DIAGNOSIS — H0279 Other degenerative disorders of eyelid and periocular area: Secondary | ICD-10-CM | POA: Diagnosis not present

## 2023-08-15 DIAGNOSIS — H02834 Dermatochalasis of left upper eyelid: Secondary | ICD-10-CM | POA: Diagnosis not present

## 2023-08-15 DIAGNOSIS — H02831 Dermatochalasis of right upper eyelid: Secondary | ICD-10-CM | POA: Diagnosis not present

## 2023-08-15 DIAGNOSIS — H02413 Mechanical ptosis of bilateral eyelids: Secondary | ICD-10-CM | POA: Diagnosis not present

## 2023-08-15 DIAGNOSIS — H02423 Myogenic ptosis of bilateral eyelids: Secondary | ICD-10-CM | POA: Diagnosis not present

## 2023-08-15 DIAGNOSIS — H53483 Generalized contraction of visual field, bilateral: Secondary | ICD-10-CM | POA: Diagnosis not present

## 2023-08-17 ENCOUNTER — Encounter: Payer: Medicare Other | Admitting: Physical Therapy

## 2023-08-22 ENCOUNTER — Ambulatory Visit: Payer: Medicare Other | Admitting: Physical Therapy

## 2023-08-24 ENCOUNTER — Encounter: Payer: Medicare Other | Admitting: Physical Therapy

## 2023-08-29 ENCOUNTER — Encounter: Payer: Medicare Other | Admitting: Physical Therapy

## 2023-08-30 ENCOUNTER — Ambulatory Visit
Admission: RE | Admit: 2023-08-30 | Discharge: 2023-08-30 | Disposition: A | Discharge status: Home / Self Care | Payer: Medicare Other | Source: Ambulatory Visit | Attending: Family Medicine | Admitting: Family Medicine

## 2023-08-30 DIAGNOSIS — K5010 Crohn's disease of large intestine without complications: Secondary | ICD-10-CM | POA: Insufficient documentation

## 2023-08-30 MED ORDER — VEDOLIZUMAB 300 MG IV SOLR
300.0000 mg | Freq: Once | INTRAVENOUS | Status: AC
  Administered 2023-08-30: 300 mg via INTRAVENOUS
  Filled 2023-08-30: qty 5

## 2023-08-31 ENCOUNTER — Encounter: Payer: Medicare Other | Admitting: Physical Therapy

## 2023-09-05 ENCOUNTER — Ambulatory Visit: Payer: Medicare Other | Attending: Physical Medicine & Rehabilitation | Admitting: Physical Therapy

## 2023-09-05 DIAGNOSIS — G8929 Other chronic pain: Secondary | ICD-10-CM | POA: Diagnosis not present

## 2023-09-05 DIAGNOSIS — R262 Difficulty in walking, not elsewhere classified: Secondary | ICD-10-CM | POA: Diagnosis not present

## 2023-09-05 DIAGNOSIS — M6281 Muscle weakness (generalized): Secondary | ICD-10-CM | POA: Diagnosis not present

## 2023-09-05 DIAGNOSIS — R2681 Unsteadiness on feet: Secondary | ICD-10-CM | POA: Insufficient documentation

## 2023-09-05 DIAGNOSIS — M5442 Lumbago with sciatica, left side: Secondary | ICD-10-CM | POA: Diagnosis not present

## 2023-09-05 NOTE — Therapy (Signed)
OUTPATIENT PHYSICAL THERAPY TREATMENT / DISCHARGE SUMMARY Dates of reporting from 06/27/2023 to 09/05/2023   Patient Name: Carolyn Campbell MRN: [id number] DOB:April 07, 1956, 67 y.o., female Today's Date: 09/05/2023  END OF SESSION:  PT End of Session - 09/05/23 1403     Visit Number 6    Number of Visits 17    Date for PT Re-Evaluation 08/22/23    Authorization Type [employer] MEDICARE reporting period from 06/27/2023    Authorization Time Period auth 16 visits 10/7-12/2  [facility] #[id number]    Authorization - Number of Visits 16    Progress Note Due on Visit 10    PT Start Time 1353    PT Stop Time 1430    PT Time Calculation (min) 37 min    Activity Tolerance Patient tolerated treatment well    Behavior During Therapy [facility] for tasks assessed/performed                   Past Medical History:  Diagnosis Date   Abdominal pain, epigastric    Acute metabolic encephalopathy 07/22/2022   Allergic rhinitis    Arthritis    Asthma    Breast cancer (HCC) 2006   LT LUMPECTOMY   Cataract    Clotting disorder (HCC)    Cognitive deficits as late effect of cerebrovascular disease    Convulsions, unspecified convulsion type (HCC) 09/24/2022   COPD (chronic obstructive pulmonary disease) (HCC)    COVID-19 virus infection 04/2021   Crohn's disease (HCC) 05/21/2015   FOLLOWED BY GI   Crohn's disease of both small and large intestine with rectal bleeding (HCC) 12/04/2014   Depression    Currently taking zoloft.   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 05/2021 Echo: EF 60-65%, no rwma, nl RV fxn.   History of kidney stones    Hypercholesterolemia 01/18/2007   Hyperlipidemia    Hypertension    Hypothyroidism    Low back pain radiating to left  leg 06/17/2021   Nonobstructive CAD (coronary artery disease)    a. 10/2012 Cath: diffuse minor irregs-->med rx; b. 08/2021 Cor CTA: Ca2+ = 55.6 (77th%'ile), LAD 25p, LCX <25p. No signif non-cardiac findings.   Occlusion, cerebral artery    NOS w/infarction   Osteoporosis    Overweight (BMI 25.0-29.9) 07/17/2022   Personal history of radiation therapy 2006   BREAST CA   Pulmonary embolism (HCC)    a.04/2021 CTA Chest: Small filling defects are noted in upper and lower lobe branches of L PA-->acute PE-->eliquis. *PE occurred in context of COVID infxn.   Stroke [facility])    Ulcer    Past Surgical History:  Procedure Laterality Date   BREAST BIOPSY Left 2006   POS   BREAST LUMPECTOMY Left 09/20/2004   positive   BREAST SURGERY Left    malignant biopsy   CARDIAC CATHETERIZATION     COLONOSCOPY  2015   COLONOSCOPY WITH PROPOFOL N/A 06/08/2017   Procedure: COLONOSCOPY WITH PROPOFOL;  Surgeon: Toney Reil, MD;  Location: [facility];  Service: Gastroenterology;  Laterality: N/A;   COLONOSCOPY WITH PROPOFOL N/A 03/08/2019   Procedure: COLONOSCOPY WITH PROPOFOL;  Surgeon: Toney Reil, MD;  Location: [facility] ENDOSCOPY;  Service: Gastroenterology;  Laterality: N/A;  COLONOSCOPY WITH PROPOFOL N/A 07/16/2020   Procedure: COLONOSCOPY WITH PROPOFOL;  Surgeon: Toney Reil, MD;  Location: [facility] ENDOSCOPY;  Service: Gastroenterology;  Laterality: N/A;   COLONOSCOPY WITH PROPOFOL N/A 09/30/2022   Procedure: COLONOSCOPY WITH PROPOFOL;  Surgeon: Toney Reil, MD;  Location: [facility] ENDOSCOPY;  Service: Gastroenterology;  Laterality: N/A;   ESOPHAGOGASTRODUODENOSCOPY  2015   ESOPHAGOGASTRODUODENOSCOPY (EGD) WITH PROPOFOL N/A 06/08/2017   Procedure: ESOPHAGOGASTRODUODENOSCOPY (EGD) WITH PROPOFOL;  Surgeon: Toney Reil, MD;  Location: [facility];  Service: Gastroenterology;  Laterality: N/A;   ESOPHAGOGASTRODUODENOSCOPY (EGD) WITH PROPOFOL N/A 03/08/2019    Procedure: ESOPHAGOGASTRODUODENOSCOPY (EGD) WITH PROPOFOL;  Surgeon: Toney Reil, MD;  Location: [facility] ENDOSCOPY;  Service: Gastroenterology;  Laterality: N/A;   EYE SURGERY     FINGER SURGERY     Right small finger   FRACTURE SURGERY     GIVENS CAPSULE STUDY  2015   TUBAL LIGATION     Patient Active Problem List   Diagnosis Date Noted   REM behavioral disorder 03/14/2023   Excessive daytime sleepiness 03/14/2023   Crohn's disease of large intestine without complication (HCC) 09/30/2022   Convulsions, unspecified convulsion type (HCC) 09/24/2022   Encounter for current long-term use of anticoagulants 09/24/2022   MDD (major depressive disorder), recurrent episode, mild (HCC) 08/18/2022   Restless leg syndrome 08/18/2022   Pulmonary embolism (HCC) 07/16/2022   Pain in limb 11/10/2021   CAD (coronary artery disease) 08/06/2021   Glaucoma 02/23/2021   Other long term (current) drug therapy 01/22/2021   Varicose veins of both lower extremities with pain 07/19/2018   Arthritis of right hand 12/21/2017   Major depression in remission (HCC) 12/21/2017   Fibrocystic breast changes 06/22/2017   Osteopenia 10/18/2016   Atherosclerosis of aorta (HCC) 12/19/2015   Vitreous degeneration 05/21/2015   Glaucoma associated with chamber angle anomalies 05/21/2015   H/O malignant neoplasm of breast 05/21/2015   Crohn's disease of both small and large intestine with rectal bleeding (HCC) 12/04/2014   Solitary pulmonary nodule 11/07/2012   Cognitive deficits as late effect of cerebrovascular disease 01/20/2010   CVA, old, hemiparesis (HCC) 04/28/2009   Acquired hypothyroidism 06/12/2008   HLD (hyperlipidemia) 01/18/2007   Primary hypertension 01/18/2007    PCP: Danelle Berry, PA-C  REFERRING PROVIDER: Elijah Birk, MD  REFERRING DIAG:  [phone], G89.29, Chronic left sided LBP with left sided sciatica  Rationale for Evaluation and Treatment: Rehabilitation  THERAPY DIAG:   Chronic left-sided low back pain with left-sided sciatica  Difficulty in walking, not elsewhere classified  Muscle weakness (generalized)  Unsteadiness on feet  ONSET DATE: >2 years   PERTINENT HISTORY:  Chronic left-sided low back pain with left-sided sciatica, patient complains of pain down left buttock down through left calf. Duration: over four years PMH: recurrent PE on Eliquis, COPD, COVID, Crohn's, hyperlipidemia, hypertension, hypothyroidism, CAD, stroke, depression  SUBJECTIVE:  SUBJECTIVE STATEMENT: Patient states she is doing pretty good. She states she thinks her body has adjusted to the gabapentin and she has been feeling a lot better. She has not been hurting like she was before. She is also doing the exercises like she has before. She had eye surgery and she went Wednesday of last week that it is healing well. She states she is allowed to exercise now. They didn't want her to stoop or bend for at least a week. She agrees her pain is better and she is able to manage it on her own.   She would like to get back to exercise class, and walking.   Exercise classes: arm weights, bands, balance, 15 min aerobics. She stopped because her leg kept hurting and she could not complete it.   PAIN:  NPRS: 1/10 left lateral thigh to lower leg.    PRECAUTIONS: Fall, on Eliquis   PATIENT GOALS: to make the pain better and to be able to manage it on my own   OBJECTIVE  SELF-REPORTED FUNCTION FOTO score: 76/100 (lumbar questionnaire)   LUMBAR ROM:    AROM Eval (06/27/23) 09/05/23  Flexion Fingertips to mid shin Fingertips to toes  Extension 50% 75%  Right lateral flexion WFL   Left lateral flexion WFL    Right rotation 20% WFL  Left rotation 20% WFL   (Blank rows = not tested) -No pain  reported during ROM testing    LOWER EXTREMITY MMT:     MMT Right Eval (06/27/23) Left Eval (06/27/23) 09/05/23 Right 09/05/23 Left  Hip flexion 4 2+ 4+ 4+  Hip extension        Hip abduction 4 4 4+ 4+  Hip adduction 4- 3+ 4 4+  Knee flexion 4 3- 4+ 4+  Knee extension 4 3+ 5 5  Ankle dorsiflexion 4 3+ 5 5  Ankle plantarflexion 4+ 3+ 4+ 44   (Blank rows = not tested)  FUNCTIONAL TESTS:  5 times sit to stand: 9 sec 6 minute walk test: 1667 feet  TODAY'S TREATMENT:   Therapeutic exercise: to centralize symptoms and improve ROM, strength, muscular endurance, and activity tolerance required for successful completion of functional activities.  - 6 Minute Walk Test - see above - AROM/MMT to assess readiness for discharge (see above).   Therapeutic activities: for functional strengthening and improved functional activity tolerance. - 5 Times Sit To Stand Test to assess readiness for discharge (see above).  - lateral stepping: AROM 1x20 each way, with GTB around ankles, 2x30 feet each way.  - forwards/backwards stepping with wide feet:  AROM 1x20 feet each way, with BlackTB around ankles 1x25 feet each way, with GTB around ankles 3x25 feet each way.   Pt required multimodal cuing for proper technique and to facilitate improved neuromuscular control, strength, range of motion, and functional ability resulting in improved performance and form.  PATIENT EDUCATION:  Education details: Exercise purpose/form. Self management techniques. Education on diagnosis, prognosis, POC, anatomy and physiology of current condition. Education on HEP including handout. Person educated: Patient Education method: Medical illustrator Education comprehension: verbalized understanding, returned demonstration, and needs further education  HOME EXERCISE PROGRAM: Access Code: [email or web address] URL: [email or web address] Date: 06/29/2023 Prepared by: Norton Blizzard  Exercises - Seated Correct  Posture  - Right Standing Lateral Shift Correction at Wall - Repetitions  -every 2 hours- 2 sets - 10 reps - or when pain is present  HOME EXERCISE PROGRAM [[id number]] View at [email or web address]" using code: [email or web address] SIDE LYING REVERSE  CLAM SHELL - ELASTIC BAND - REVERSE CLAMSHELL -  Repeat 10 Repetitions, Hold 1 Second(s), Complete 3 Sets, Perform 3 Times a Week  LATERAL SIDE STEPS -  Duration 60 Seconds, Complete 3 Sets, Perform 3 Times a Week  Monster Walk -  Duration 60 Seconds, Complete 3 Sets, Perform 3 Times a Week  ASSESSMENT:  CLINICAL IMPRESSION: Patient returns to PT 4 weeks since last PT session. She has attended 6 physical therapy sessions since starting current episode of care on 06/27/2023. She has met all of her goals and is now discharged from PT to long term HEP program.   OBJECTIVE IMPAIRMENTS: Abnormal gait, cardiopulmonary status limiting activity, decreased activity tolerance, decreased balance, decreased coordination, decreased endurance, decreased mobility, decreased ROM, decreased strength, impaired flexibility, improper body mechanics, postural dysfunction, and pain.   ACTIVITY LIMITATIONS: lifting, bending, sitting, standing, and squatting  PARTICIPATION LIMITATIONS: cleaning, laundry, shopping, and community activity  PERSONAL FACTORS: Age, Behavior pattern, Past/current experiences, Time since onset of injury/illness/exacerbation, and 3+ comorbidities:  recurrent PE on Eliquis, COPD, COVID, Crohn's, hyperlipidemia, hypertension, hypothyroidism, CAD, stroke, depression  are also affecting patient's functional outcome.   REHAB POTENTIAL: Fair    CLINICAL DECISION MAKING: Stable/uncomplicated  EVALUATION COMPLEXITY: Moderate   GOALS: Goals reviewed with patient? Yes  SHORT TERM GOALS: Target date: 07/22/2023   Patient will be independent in HEP to improve strength/mobility for better functional independence with ADLs. Baseline: 10/7: HEP initiated  Goal  status: MET  LONG TERM GOALS: Target date: 08/19/2023   Patient will increase FOTO score to equal to or greater than 58 to demonstrate statistically significant improvement in mobility and quality of life.  Baseline: 10/7: 45; 12/16: 76.  Goal status: MET  2.  Patient will increase six minute walk test distance by 200' for progression to more efficient community ambulator and improve gait ability Baseline: 10/7: to be tested on visit #2; 10/9: 1100 feet with no AD, antalgic gait favoring L LE noted to improve by end of walk, reports improved pain at ankle and at left glute; 12/16: 1667 feet with no significant increase in symptoms (09/05/2023);  Goal status: MET  3.  Patient (> 69 years old) will complete five times sit to stand test in < 15 seconds indicating an increased LE strength and improved balance. Baseline: 10/7: 34.53 sec; 12/16: 9 seconds.  Goal status: MET  4.  Patient will demonstrate full lumbar AROM with no compensations or increase in pain in all planes except intermittent end range discomfort to allow patient to complete valued activities with less difficulty.  Baseline: 10/7: see above; 12/16: met see above Goal status: MET  5.  Patient will improve BLE strength by >0.5 MMT grade to demonstrate improved functional strength, increased standing tolerance, and increased ADL ability.   Baseline: 10/7: see above. 12/16:  Goal status: In-progress  6. Patient will report a worst pain of 2/10 on NRPS in low back and LLE to improve tolerance with ADLs and reduced symptoms with activities.  Baseline: 10/7: 4/10; 12/16: 3/10 Goal status: Partially Met  PLAN:  PT FREQUENCY: 1-2x/week  PT DURATION: 8 weeks  PLANNED INTERVENTIONS: Therapeutic exercises, Therapeutic activity, Neuromuscular re-education, Balance training, Gait training, Patient/Family education, Self Care, Joint mobilization, Joint manipulation, DME instructions, Aquatic Therapy, Dry Needling, Spinal  manipulation, Spinal mobilization, Cryotherapy, Moist heat, Traction, and Manual therapy.  PLAN FOR NEXT SESSION: Patient is now discharged.    Luretha Murphy. Ilsa Iha, PT, DPT 09/05/23, 2:50 PM  [facility] [address] [place], [place] [address] P: [phone] I F: [phone]

## 2023-09-07 ENCOUNTER — Encounter: Payer: Medicare Other | Admitting: Physical Therapy

## 2023-09-10 DIAGNOSIS — R079 Chest pain, unspecified: Secondary | ICD-10-CM | POA: Diagnosis not present

## 2023-09-10 DIAGNOSIS — R0602 Shortness of breath: Secondary | ICD-10-CM | POA: Diagnosis not present

## 2023-09-12 ENCOUNTER — Encounter: Payer: Medicare Other | Admitting: Physical Therapy

## 2023-09-15 ENCOUNTER — Encounter: Payer: Medicare Other | Admitting: Physical Therapy

## 2023-09-28 ENCOUNTER — Ambulatory Visit (INDEPENDENT_AMBULATORY_CARE_PROVIDER_SITE_OTHER): Payer: Medicare Other | Admitting: Nurse Practitioner

## 2023-09-28 ENCOUNTER — Encounter: Payer: Self-pay | Admitting: Nurse Practitioner

## 2023-09-28 VITALS — BP 122/62 | HR 80 | Temp 98.0°F | Resp 18 | Ht 60.0 in | Wt 148.0 lb

## 2023-09-28 DIAGNOSIS — E782 Mixed hyperlipidemia: Secondary | ICD-10-CM | POA: Diagnosis not present

## 2023-09-28 DIAGNOSIS — I10 Essential (primary) hypertension: Secondary | ICD-10-CM | POA: Diagnosis not present

## 2023-09-28 DIAGNOSIS — K5010 Crohn's disease of large intestine without complications: Secondary | ICD-10-CM

## 2023-09-28 DIAGNOSIS — E039 Hypothyroidism, unspecified: Secondary | ICD-10-CM

## 2023-09-28 DIAGNOSIS — I700 Atherosclerosis of aorta: Secondary | ICD-10-CM

## 2023-09-28 DIAGNOSIS — I2699 Other pulmonary embolism without acute cor pulmonale: Secondary | ICD-10-CM

## 2023-09-28 DIAGNOSIS — I25118 Atherosclerotic heart disease of native coronary artery with other forms of angina pectoris: Secondary | ICD-10-CM | POA: Diagnosis not present

## 2023-09-28 DIAGNOSIS — F330 Major depressive disorder, recurrent, mild: Secondary | ICD-10-CM

## 2023-09-28 DIAGNOSIS — I69359 Hemiplegia and hemiparesis following cerebral infarction affecting unspecified side: Secondary | ICD-10-CM

## 2023-09-28 NOTE — Progress Notes (Signed)
 BP 122/62   Pulse 80   Temp 98 F (36.7 C)   Resp 18   Ht 5' (1.524 m)   Wt 148 lb (67.1 kg)   BMI 28.90 kg/m    Subjective:    Patient ID: Carolyn Campbell, female    DOB: 07-22-1956, 68 y.o.   MRN: [id number]  HPI: Carolyn Campbell is a 68 y.o. female  Chief Complaint  Patient presents with   Medical Management of Chronic Issues    Discussed the use of AI scribe software for clinical note transcription with the patient, who gave verbal consent to proceed.  History of Present Illness   The patient, with a complex medical history including hypertension, coronary artery disease, atherosclerosis of the aorta, Crohn's disease, hypothyroidism, previous CVA, depression, hyperlipidemia, and a pulmonary embolism, presents with improved leg problems since starting gabapentin . She also reports recent weight gain despite efforts to control her diet. She expresses concern about her kidney function following a home test that showed elevated microalbumin levels. The patient is currently on multiple medications including Eliquis , levothyroxine , losartan -hydrochlorothiazide , rosuvastatin , Zoloft , and Entyvio  injection. She expresses interest in starting a diet regimen with gummies and requests a referral to a psychiatrist for ongoing mental health concerns.       09/28/2023    3:07 PM 05/09/2023   11:24 AM 03/28/2023    2:26 PM  Depression screen PHQ 2/9  Decreased Interest 0 0 0  Down, Depressed, Hopeless 0 0 0  PHQ - 2 Score 0 0 0  Altered sleeping 0 0 0  Tired, decreased energy 0 0 0  Change in appetite 0 0 0  Feeling bad or failure about yourself  0 0 0  Trouble concentrating 0 0 0  Moving slowly or fidgety/restless 0 0 0  Suicidal thoughts 0 0 0  PHQ-9 Score 0 0 0  Difficult doing work/chores Not difficult at all Not difficult at all Not difficult at all    Relevant past medical, surgical, family and social history reviewed and updated as indicated. Interim medical history since our last  visit reviewed. Allergies and medications reviewed and updated.  Review of Systems  Constitutional: Negative for fever or weight change.  Respiratory: Negative for cough and shortness of breath.   Cardiovascular: Negative for chest pain or palpitations.  Gastrointestinal: Negative for abdominal pain, no bowel changes.  Musculoskeletal: Negative for gait problem or joint swelling.  Skin: Negative for rash.  Neurological: Negative for dizziness or headache.  No other specific complaints in a complete review of systems (except as listed in HPI above).      Objective:    BP 122/62   Pulse 80   Temp 98 F (36.7 C)   Resp 18   Ht 5' (1.524 m)   Wt 148 lb (67.1 kg)   BMI 28.90 kg/m   BP Readings from Last 3 Encounters:  09/28/23 122/62  07/04/23 133/70  06/29/23 (!) 140/58     Wt Readings from Last 3 Encounters:  09/28/23 148 lb (67.1 kg)  07/04/23 143 lb 6 oz (65 kg)  06/07/23 147 lb (66.7 kg)    Physical Exam  Constitutional: Patient appears well-developed and well-nourished.  No distress.  HEENT: head atraumatic, normocephalic, pupils equal and reactive to light, neck supple Cardiovascular: Normal rate, regular rhythm and normal heart sounds.  No murmur heard. No BLE edema. Pulmonary/Chest: Effort normal and breath sounds normal. No respiratory distress. Abdominal: Soft.  There is no tenderness. Psychiatric: Patient  has a normal mood and affect. behavior is normal. Judgment and thought content normal.  Results for orders placed or performed in visit on 07/04/23  Calprotectin, Fecal   Collection Time: 07/05/23  2:30 PM   Specimen: Stool  Result Value Ref Range   Calprotectin, Fecal 58 0 - 120 ug/g   Last metabolic panel Lab Results  Component Value Date   GLUCOSE 126 (H) 06/07/2023   NA 137 06/07/2023   K 3.2 (L) 06/07/2023   CL 101 06/07/2023   CO2 27 06/07/2023   BUN 15 06/07/2023   CREATININE 1.05 (H) 06/07/2023   GFRNONAA 58 (L) 06/07/2023   CALCIUM  9.5  06/07/2023   PROT 7.2 06/07/2023   ALBUMIN 4.0 06/07/2023   LABGLOB 2.4 05/29/2019   AGRATIO 1.8 05/29/2019   BILITOT 0.6 06/07/2023   ALKPHOS 60 06/07/2023   AST 22 06/07/2023   ALT 18 06/07/2023   ANIONGAP 9 06/07/2023   Last lipids Lab Results  Component Value Date   CHOL 200 (H) 03/28/2023   HDL 98 03/28/2023   LDLCALC 83 03/28/2023   TRIG 98 03/28/2023   CHOLHDL 2.0 03/28/2023   Last thyroid  functions Lab Results  Component Value Date   TSH 2.85 05/09/2023        Assessment & Plan:   Problem List Items Addressed This Visit       Cardiovascular and Mediastinum   Primary hypertension   Relevant Orders   CBC with Differential/Platelet   COMPLETE METABOLIC PANEL WITH GFR   Atherosclerosis of aorta (HCC) - Primary   Relevant Orders   COMPLETE METABOLIC PANEL WITH GFR   Lipid panel   CAD (coronary artery disease)   Relevant Orders   COMPLETE METABOLIC PANEL WITH GFR   Lipid panel   Pulmonary embolism (HCC)     Digestive   Crohn's disease of large intestine without complication (HCC)     Endocrine   Acquired hypothyroidism   Relevant Orders   TSH     Nervous and Auditory   CVA, old, hemiparesis (HCC)   Relevant Orders   COMPLETE METABOLIC PANEL WITH GFR   Lipid panel     Other   HLD (hyperlipidemia)   Relevant Orders   COMPLETE METABOLIC PANEL WITH GFR   Lipid panel   MDD (major depressive disorder), recurrent episode, mild (HCC)   Relevant Orders   Ambulatory referral to Psychiatry     Assessment and Plan    Chronic Kidney Disease Elevated microalbuminuria. Currently on Losartan -Hydrochlorothiazide  50-12.5mg  daily for kidney protection. Discussed the option of adding Jardiance or farxiga for further kidney protection, but decided to monitor and reassess in a year due to patient's current medication load. -Repeat microalbuminuria test in 1 year. -Check GFR today and refer to nephrology if significantly low. -blood pressure at goal  today  Hypothyroidism Patient reports weight gain. Currently on Levothyroxine  50mcg daily. -Check TSH today to assess if dose adjustment is needed.  Depression Patient requests referral to a psychiatrist for further management. Currently on Zoloft  50mg  daily. -Refer to psychiatrist for further management.   HTN/CAD/atherosclerosis/hx of PE/hx of cva -continue taking  losartan -hydrochlorothiazide  50-125 mg daily, rosuvastatin  40 mg daily, eliquis  5 mg BID -denies any chest pain or shortness of breath -check lipid panel, cbc and cmp today   crohns disease -managed by gi  General Health Maintenance: -Continue current medications Eliquis  5mg  BID, Rosuvastatin  40mg  daily, Entyvio  injection 300mg  as directed by GI specialist. -Consider apple cider gummies for weight management, ensuring to  avoid those with stimulants.        Follow up plan: Return in about 6 months (around 03/27/2024) for follow up with Leisa.

## 2023-09-29 LAB — CBC WITH DIFFERENTIAL/PLATELET
Absolute Lymphocytes: 1915 {cells}/uL (ref 850–3900)
Absolute Monocytes: 439 {cells}/uL (ref 200–950)
Basophils Absolute: 29 {cells}/uL (ref 0–200)
Basophils Relative: 0.5 %
Eosinophils Absolute: 131 {cells}/uL (ref 15–500)
Eosinophils Relative: 2.3 %
HCT: 37.2 % (ref 35.0–45.0)
Hemoglobin: 12.3 g/dL (ref 11.7–15.5)
MCH: 29.7 pg (ref 27.0–33.0)
MCHC: 33.1 g/dL (ref 32.0–36.0)
MCV: 89.9 fL (ref 80.0–100.0)
MPV: 11 fL (ref 7.5–12.5)
Monocytes Relative: 7.7 %
Neutro Abs: 3186 {cells}/uL (ref 1500–7800)
Neutrophils Relative %: 55.9 %
Platelets: 213 10*3/uL (ref 140–400)
RBC: 4.14 10*6/uL (ref 3.80–5.10)
RDW: 12.6 % (ref 11.0–15.0)
Total Lymphocyte: 33.6 %
WBC: 5.7 10*3/uL (ref 3.8–10.8)

## 2023-09-29 LAB — COMPLETE METABOLIC PANEL WITH GFR
AG Ratio: 1.6 (calc) (ref 1.0–2.5)
ALT: 15 U/L (ref 6–29)
AST: 17 U/L (ref 10–35)
Albumin: 4.4 g/dL (ref 3.6–5.1)
Alkaline phosphatase (APISO): 78 U/L (ref 37–153)
BUN: 17 mg/dL (ref 7–25)
CO2: 30 mmol/L (ref 20–32)
Calcium: 10.7 mg/dL — ABNORMAL HIGH (ref 8.6–10.4)
Chloride: 104 mmol/L (ref 98–110)
Creat: 0.86 mg/dL (ref 0.50–1.05)
Globulin: 2.8 g/dL (ref 1.9–3.7)
Glucose, Bld: 84 mg/dL (ref 65–99)
Potassium: 3.7 mmol/L (ref 3.5–5.3)
Sodium: 142 mmol/L (ref 135–146)
Total Bilirubin: 0.6 mg/dL (ref 0.2–1.2)
Total Protein: 7.2 g/dL (ref 6.1–8.1)
eGFR: 73 mL/min/{1.73_m2} (ref >=60)

## 2023-09-29 LAB — LIPID PANEL
Cholesterol: 205 mg/dL — ABNORMAL HIGH (ref <200)
HDL: 102 mg/dL (ref >=50)
LDL Cholesterol (Calc): 82 mg/dL
Non-HDL Cholesterol (Calc): 103 mg/dL (ref <130)
Total CHOL/HDL Ratio: 2 (calc) (ref <5.0)
Triglycerides: 109 mg/dL (ref <150)

## 2023-09-29 LAB — TSH: TSH: 1.12 m[IU]/L (ref 0.40–4.50)

## 2023-10-05 ENCOUNTER — Ambulatory Visit
Admission: RE | Admit: 2023-10-05 | Discharge: 2023-10-05 | Disposition: A | Discharge status: Home / Self Care | Payer: Medicare Other | Source: Ambulatory Visit | Attending: Emergency Medicine | Admitting: Emergency Medicine

## 2023-10-05 VITALS — BP 138/74 | HR 75 | Temp 98.2°F | Resp 18

## 2023-10-05 DIAGNOSIS — M436 Torticollis: Secondary | ICD-10-CM | POA: Diagnosis not present

## 2023-10-05 MED ORDER — METAXALONE 800 MG PO TABS: 400.0000 mg | ORAL_TABLET | Freq: Two times a day (BID) | ORAL | 0 refills | Status: DC | PRN

## 2023-10-05 NOTE — ED Provider Notes (Signed)
 [facility]    CSN: [id number] Arrival date & time: 10/05/23  1007      History   Chief Complaint Chief Complaint  Patient presents with   Neck Injury    Have a crook  on the right side of neck and affecting that hold side of face including the head, eye and ear all the way down my neck. - Entered by patient    HPI Carolyn Campbell is a 68 y.o. female.  Patient presents with right side neck pain and stiffness x 4 days.  No falls or injury.  The pain feels like a muscle spasm; is worse with movement of her neck; no alleviating factors noted.  No numbness, weakness, paresthesias, rash, bruising, redness, fever.  No OTC medications taken today; previously treated with Tylenol  and lidocaine  patch.  Her medical history includes hypertension, hyperlipidemia, embolism, stroke, COPD.  She is on Eliquis .  The history is provided by the patient and medical records.    Past Medical History:  Diagnosis Date   Abdominal pain, epigastric    Acute metabolic encephalopathy 07/22/2022   Allergic rhinitis    Arthritis    Asthma    Breast cancer (HCC) 2006   LT LUMPECTOMY   Cataract    Clotting disorder (HCC)    Cognitive deficits as late effect of cerebrovascular disease    Convulsions, unspecified convulsion type (HCC) 09/24/2022   COPD (chronic obstructive pulmonary disease) (HCC)    COVID-19 virus infection 04/2021   Crohn's disease (HCC) 05/21/2015   FOLLOWED BY GI   Crohn's disease of both small and large intestine with rectal bleeding (HCC) 12/04/2014   Depression    Currently taking zoloft .   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 05/2021 Echo: EF 60-65%, no rwma, nl RV fxn.   History of kidney stones    Hypercholesterolemia  01/18/2007   Hyperlipidemia    Hypertension    Hypothyroidism    Low back pain radiating to left leg 06/17/2021   Nonobstructive CAD (coronary artery disease)    a. 10/2012 Cath: diffuse minor irregs-->med rx; b. 08/2021 Cor CTA: Ca2+ = 55.6 (77th%'ile), LAD 25p, LCX <25p. No signif non-cardiac findings.   Occlusion, cerebral artery    NOS w/infarction   Osteoporosis    Overweight (BMI 25.0-29.9) 07/17/2022   Personal history of radiation therapy 2006   BREAST CA   Pulmonary embolism (HCC)    a.04/2021 CTA Chest: Small filling defects are noted in upper and lower lobe branches of L PA-->acute PE-->eliquis . *PE occurred in context of COVID infxn.   Stroke [facility])    Ulcer     Patient Active Problem List   Diagnosis Date Noted   REM behavioral disorder 03/14/2023   Excessive daytime sleepiness 03/14/2023   Crohn's disease of large intestine without complication (HCC) 09/30/2022   Convulsions, unspecified convulsion type (HCC) 09/24/2022   Encounter for current long-term use of anticoagulants 09/24/2022   MDD (major depressive disorder), recurrent episode, mild (HCC) 08/18/2022   Restless leg syndrome 08/18/2022   Pulmonary embolism (HCC) 07/16/2022   Pain in limb 11/10/2021   CAD (coronary artery disease) 08/06/2021   Glaucoma 02/23/2021   Other long term (current) drug therapy 01/22/2021  Varicose veins of both lower extremities with pain 07/19/2018   Arthritis of right hand 12/21/2017   Major depression in remission (HCC) 12/21/2017   Fibrocystic breast changes 06/22/2017   Osteopenia 10/18/2016   Atherosclerosis of aorta (HCC) 12/19/2015   Vitreous degeneration 05/21/2015   Glaucoma associated with chamber angle anomalies 05/21/2015   H/O malignant neoplasm of breast 05/21/2015   Solitary pulmonary nodule 11/07/2012   Cognitive deficits as late effect of cerebrovascular disease 01/20/2010   CVA, old, hemiparesis (HCC) 04/28/2009   Acquired hypothyroidism 06/12/2008   HLD  (hyperlipidemia) 01/18/2007   Primary hypertension 01/18/2007    Past Surgical History:  Procedure Laterality Date   BREAST BIOPSY Left 2006   POS   BREAST LUMPECTOMY Left 09/20/2004   positive   BREAST SURGERY Left    malignant biopsy   CARDIAC CATHETERIZATION     COLONOSCOPY  2015   COLONOSCOPY WITH PROPOFOL  N/A 06/08/2017   Procedure: COLONOSCOPY WITH PROPOFOL ;  Surgeon: Selena Daily, MD;  Location: [facility];  Service: Gastroenterology;  Laterality: N/A;   COLONOSCOPY WITH PROPOFOL  N/A 03/08/2019   Procedure: COLONOSCOPY WITH PROPOFOL ;  Surgeon: Selena Daily, MD;  Location: [facility] ENDOSCOPY;  Service: Gastroenterology;  Laterality: N/A;   COLONOSCOPY WITH PROPOFOL  N/A 07/16/2020   Procedure: COLONOSCOPY WITH PROPOFOL ;  Surgeon: Selena Daily, MD;  Location: [facility] ENDOSCOPY;  Service: Gastroenterology;  Laterality: N/A;   COLONOSCOPY WITH PROPOFOL  N/A 09/30/2022   Procedure: COLONOSCOPY WITH PROPOFOL ;  Surgeon: Selena Daily, MD;  Location: [facility] ENDOSCOPY;  Service: Gastroenterology;  Laterality: N/A;   ESOPHAGOGASTRODUODENOSCOPY  2015   ESOPHAGOGASTRODUODENOSCOPY (EGD) WITH PROPOFOL  N/A 06/08/2017   Procedure: ESOPHAGOGASTRODUODENOSCOPY (EGD) WITH PROPOFOL ;  Surgeon: Selena Daily, MD;  Location: [facility];  Service: Gastroenterology;  Laterality: N/A;   ESOPHAGOGASTRODUODENOSCOPY (EGD) WITH PROPOFOL  N/A 03/08/2019   Procedure: ESOPHAGOGASTRODUODENOSCOPY (EGD) WITH PROPOFOL ;  Surgeon: Selena Daily, MD;  Location: [facility] ENDOSCOPY;  Service: Gastroenterology;  Laterality: N/A;   EYE SURGERY     FINGER SURGERY     Right small finger   FRACTURE SURGERY     GIVENS CAPSULE STUDY  2015   TUBAL LIGATION      OB History   No obstetric history on file.      Home Medications    Prior to Admission medications   Medication Sig Start Date End Date Taking? Authorizing Provider  metaxalone  (SKELAXIN ) 800 MG tablet Take 0.5  tablets (400 mg total) by mouth 2 (two) times daily as needed for muscle spasms. 10/05/23  Yes Wellington Half, NP  albuterol  (VENTOLIN  HFA) 108 (90 Base) MCG/ACT inhaler Inhale 2 puffs into the lungs every 6 (six) hours as needed for wheezing or shortness of breath (chest tightness). 12/10/22   Tapia, Leisa, PA-C  apixaban  (ELIQUIS ) 5 MG TABS tablet Take 1 tablet (5 mg total) by mouth 2 (two) times daily. 11/29/22 07/05/23  Agrawal, Kavita, MD  brimonidine (ALPHAGAN) 0.2 % ophthalmic solution Place 1 drop into both eyes in the morning and at bedtime.    [provider]  gabapentin  (NEURONTIN ) 300 MG capsule 1 po qHS x 4 days, then bid x 4 days, then tid 09/05/23   [provider]  latanoprost  (XALATAN ) 0.005 % ophthalmic solution Place 1 drop into both eyes at bedtime.  04/22/16   [provider]  levothyroxine  (SYNTHROID ) 50 MCG tablet Take 50 mcg po qam before breakfast 5 d a week, and take 100 mcg (2 tabs) po qam before breakfast  2 d a week 05/12/23   Tapia, Leisa, PA-C  losartan -hydrochlorothiazide  (HYZAAR) 50-12.5 MG tablet Take 1 tablet by mouth daily. 08/02/23   Tapia, Leisa, PA-C  Multiple Vitamin (MULTIVITAMIN) tablet Take 1 tablet by mouth daily.    [provider]  rosuvastatin  (CRESTOR ) 40 MG tablet Take 1 tablet (40 mg total) by mouth daily. 03/30/23 03/24/24  Tapia, Leisa, PA-C  sertraline  (ZOLOFT ) 50 MG tablet Take 1 tablet by mouth once daily 05/02/23   Tapia, Leisa, PA-C  vedolizumab  (ENTYVIO ) 300 MG injection Induction Entyvio  300mg  at week 0,2, and 6 and then maintenance does every 8 weeks 10/11/22   Selena Daily, MD    Family History Family History  Problem Relation Age of Onset   Heart disease Mother    Heart attack Mother    Hypertension Mother    Varicose Veins Mother    Cerebrovascular Accident Father    Arthritis Father    Hypertension Father    Dementia Father    Breast cancer Sister    Cancer Sister    Breast cancer Sister 17    Cancer Sister    Cancer Sister    Hypertension Other    Diabetes Other    Heart attack Other 25   Mental illness Neg Hx     Social History Social History   Tobacco Use   Smoking status: Never   Smokeless tobacco: Never  Vaping Use   Vaping status: Never Used  Substance Use Topics   Alcohol use: Never   Drug use: Never     Allergies   Patient has no known allergies.   Review of Systems Review of Systems  Constitutional:  Negative for chills and fever.  Musculoskeletal:  Positive for neck pain and neck stiffness.  Skin:  Negative for color change, rash and wound.  Neurological:  Negative for weakness and numbness.     Physical Exam Triage Vital Signs ED Triage Vitals  Encounter Vitals Group     BP 10/05/23 1039 138/74     Systolic BP Percentile --      Diastolic BP Percentile --      Pulse Rate 10/05/23 1028 75     Resp 10/05/23 1028 18     Temp 10/05/23 1028 98.2 F (36.8 C)     Temp src --      SpO2 10/05/23 1028 96 %     Weight --      Height --      Head Circumference --      Peak Flow --      Pain Score --      Pain Loc --      Pain Education --      Exclude from Growth Chart --    No data found.  Updated Vital Signs BP 138/74   Pulse 75   Temp 98.2 F (36.8 C)   Resp 18   SpO2 96%   Visual Acuity Right Eye Distance:   Left Eye Distance:   Bilateral Distance:    Right Eye Near:   Left Eye Near:    Bilateral Near:     Physical Exam Constitutional:      General: She is not in acute distress. HENT:     Right Ear: Tympanic membrane normal.     Left Ear: Tympanic membrane normal.     Nose: Nose normal.     Mouth/Throat:     Mouth: Mucous membranes are moist.     Pharynx: Oropharynx is  clear.  Cardiovascular:     Rate and Rhythm: Normal rate and regular rhythm.     Heart sounds: Normal heart sounds.  Pulmonary:     Effort: Pulmonary effort is normal. No respiratory distress.     Breath sounds: Normal breath sounds.   Musculoskeletal:        General: Tenderness present. No swelling or deformity.     Comments: Muscles of right neck tender to palpation.  Limited neck ROM due to discomfort.   Skin:    General: Skin is warm and dry.     Findings: No bruising, erythema, lesion or rash.  Neurological:     General: No focal deficit present.     Mental Status: She is alert.     Sensory: No sensory deficit.     Motor: No weakness.      UC Treatments / Results  Labs (all labs ordered are listed, but only abnormal results are displayed) Labs Reviewed - No data to display  EKG   Radiology No results found.  Procedures Procedures (including critical care time)  Medications Ordered in UC Medications - No data to display  Initial Impression / Assessment and Plan / UC Course  I have reviewed the triage vital signs and the nursing notes.  Pertinent labs & imaging results that were available during my care of the patient were reviewed by me and considered in my medical decision making (see chart for details).    Torticollis.  Instructed patient to take Tylenol  for discomfort.  Discussed symptomatic management including gentle stretching and massage.  Treating muscle spasm with low-dose Skelaxin .  Precautions for drowsiness with this muscle relaxer and fall precautions discussed with patient.  Education provided on acute torticollis.  Instructed her to follow-up with her PCP if she is not improving.  She agrees to plan of care.  Final Clinical Impressions(s) / UC Diagnoses   Final diagnoses:  Torticollis     Discharge Instructions      Take Tylenol  as needed for discomfort.    Take the muscle relaxer as needed for muscle spasm; Do not drive, operate machinery, or drink alcohol with this medication as it can cause drowsiness.   Follow up with your primary care provider if your symptoms are not improving.         ED Prescriptions     Medication Sig Dispense Auth. Provider   metaxalone   (SKELAXIN ) 800 MG tablet Take 0.5 tablets (400 mg total) by mouth 2 (two) times daily as needed for muscle spasms. 10 tablet Wellington Half, NP      I have reviewed the PDMP during this encounter.   Wellington Half, NP 10/05/23 1112

## 2023-10-05 NOTE — Discharge Instructions (Addendum)
 Take Tylenol as needed for discomfort.  Take the muscle relaxer as needed for muscle spasm; Do not drive, operate machinery, or drink alcohol with this medication as it can cause drowsiness.   Follow up with your primary care provider if your symptoms are not improving.

## 2023-10-05 NOTE — ED Triage Notes (Signed)
 Patient to Urgent Care with complaints of right sided neck pain and stiffness.   Difficulty moving her face/  head/ eye/ ear/ neck. Throbbing pain. Symptoms started four days ago, worse over the last two days.  Using lidocaine  patches/ taking tylenol / using ice packs and heating pads.

## 2023-10-17 ENCOUNTER — Other Ambulatory Visit: Payer: Self-pay | Admitting: Family Medicine

## 2023-10-25 ENCOUNTER — Ambulatory Visit
Admission: RE | Admit: 2023-10-25 | Discharge: 2023-10-25 | Disposition: A | Discharge status: Home / Self Care | Payer: Medicare Other | Source: Ambulatory Visit | Attending: Gastroenterology | Admitting: Gastroenterology

## 2023-10-25 DIAGNOSIS — K5010 Crohn's disease of large intestine without complications: Secondary | ICD-10-CM | POA: Diagnosis not present

## 2023-10-25 MED ORDER — VEDOLIZUMAB 300 MG IV SOLR
300.0000 mg | Freq: Once | INTRAVENOUS | Status: AC
  Administered 2023-10-25: 300 mg via INTRAVENOUS
  Filled 2023-10-25: qty 5

## 2023-10-28 ENCOUNTER — Ambulatory Visit: Payer: Medicare Other | Admitting: Family Medicine

## 2023-10-28 ENCOUNTER — Ambulatory Visit
Admission: RE | Admit: 2023-10-28 | Discharge: 2023-10-28 | Disposition: A | Discharge status: Home / Self Care | Payer: Medicare Other | Source: Ambulatory Visit | Attending: Family Medicine | Admitting: Family Medicine

## 2023-10-28 ENCOUNTER — Ambulatory Visit
Admission: RE | Admit: 2023-10-28 | Discharge: 2023-10-28 | Disposition: A | Discharge status: Home / Self Care | Payer: Medicare Other | Attending: Family Medicine | Admitting: Family Medicine

## 2023-10-28 ENCOUNTER — Encounter: Payer: Self-pay | Admitting: Family Medicine

## 2023-10-28 VITALS — BP 126/82 | HR 83 | Resp 16 | Ht 60.0 in | Wt 148.0 lb

## 2023-10-28 DIAGNOSIS — M542 Cervicalgia: Secondary | ICD-10-CM

## 2023-10-28 DIAGNOSIS — M47812 Spondylosis without myelopathy or radiculopathy, cervical region: Secondary | ICD-10-CM | POA: Diagnosis not present

## 2023-10-28 MED ORDER — MELOXICAM 15 MG PO TABS
7.5000 mg | ORAL_TABLET | Freq: Every day | ORAL | 0 refills | Status: DC
Start: 2023-10-28 — End: 2024-03-26

## 2023-10-28 NOTE — Progress Notes (Signed)
 Patient ID: Carolyn Campbell, female    DOB: 1956/07/10, 68 y.o.   MRN: [id number]  PCP: Leavy Mole, PA-C  Chief Complaint  Patient presents with   Neck Pain    X3 weeks. Went to UC and given rx, but muscle relaxer's didn't help.    Subjective:   Carolyn Campbell is a 68 y.o. female, presents to clinic with CC of the following:  HPI  Here with neck pain and stiffness for more than 3 weeks No injury or strain She did present to urgent care and got muscle relaxers, she is noted only very slight improvement since the urgent care visit Her pain is worse with rotation to the right and she has pain up higher at her posterior right neck at the base of her skull She also has limited rotation to the left flexion and extension   Patient Active Problem List   Diagnosis Date Noted   REM behavioral disorder 03/14/2023   Excessive daytime sleepiness 03/14/2023   Crohn's disease of large intestine without complication (HCC) 09/30/2022   Convulsions, unspecified convulsion type (HCC) 09/24/2022   Encounter for current long-term use of anticoagulants 09/24/2022   MDD (major depressive disorder), recurrent episode, mild (HCC) 08/18/2022   Restless leg syndrome 08/18/2022   Pulmonary embolism (HCC) 07/16/2022   Pain in limb 11/10/2021   CAD (coronary artery disease) 08/06/2021   Glaucoma 02/23/2021   Other long term (current) drug therapy 01/22/2021   Varicose veins of both lower extremities with pain 07/19/2018   Arthritis of right hand 12/21/2017   Major depression in remission (HCC) 12/21/2017   Fibrocystic breast changes 06/22/2017   Osteopenia 10/18/2016   Atherosclerosis of aorta (HCC) 12/19/2015   Vitreous degeneration 05/21/2015   Glaucoma associated with chamber angle anomalies 05/21/2015   H/O malignant neoplasm of breast 05/21/2015   Solitary pulmonary nodule 11/07/2012   Cognitive deficits as late effect of cerebrovascular disease 01/20/2010   CVA, old, hemiparesis (HCC)  04/28/2009   Acquired hypothyroidism 06/12/2008   HLD (hyperlipidemia) 01/18/2007   Primary hypertension 01/18/2007      Current Outpatient Medications:    albuterol  (VENTOLIN  HFA) 108 (90 Base) MCG/ACT inhaler, Inhale 2 puffs into the lungs every 6 (six) hours as needed for wheezing or shortness of breath (chest tightness)., Disp: 8 g, Rfl: 2   brimonidine (ALPHAGAN) 0.2 % ophthalmic solution, Place 1 drop into both eyes in the morning and at bedtime., Disp: , Rfl:    gabapentin  (NEURONTIN ) 300 MG capsule, 1 po qHS x 4 days, then bid x 4 days, then tid, Disp: , Rfl:    latanoprost  (XALATAN ) 0.005 % ophthalmic solution, Place 1 drop into both eyes at bedtime. , Disp: , Rfl:    levothyroxine  (SYNTHROID ) 50 MCG tablet, Take 50 mcg po qam before breakfast 5 d a week, and take 100 mcg (2 tabs) po qam before breakfast 2 d a week, Disp: 110 tablet, Rfl: 1   losartan -hydrochlorothiazide  (HYZAAR) 50-12.5 MG tablet, Take 1 tablet by mouth daily., Disp: 90 tablet, Rfl: 1   metaxalone  (SKELAXIN ) 800 MG tablet, Take 0.5 tablets (400 mg total) by mouth 2 (two) times daily as needed for muscle spasms., Disp: 10 tablet, Rfl: 0   Multiple Vitamin (MULTIVITAMIN) tablet, Take 1 tablet by mouth daily., Disp: , Rfl:    rosuvastatin  (CRESTOR ) 40 MG tablet, Take 1 tablet (40 mg total) by mouth daily., Disp: 90 tablet, Rfl: 3   sertraline  (ZOLOFT ) 50 MG tablet, Take 1 tablet  by mouth once daily, Disp: 90 tablet, Rfl: 0   vedolizumab  (ENTYVIO ) 300 MG injection, Induction Entyvio  300mg  at week 0,2, and 6 and then maintenance does every 8 weeks, Disp: 1 each, Rfl: 12   apixaban  (ELIQUIS ) 5 MG TABS tablet, Take 1 tablet (5 mg total) by mouth 2 (two) times daily., Disp: 60 tablet, Rfl: 0   No Known Allergies   Social History   Tobacco Use   Smoking status: Never   Smokeless tobacco: Never  Vaping Use   Vaping status: Never Used  Substance Use Topics   Alcohol use: Never   Drug use: Never      Chart Review  Today: I personally reviewed active problem list, medication list, allergies, family history, social history, health maintenance, notes from last encounter, lab results, imaging with the patient/caregiver today.    Review of Systems  Constitutional: Negative.  Negative for activity change, fatigue and fever.  HENT: Negative.    Eyes: Negative.   Respiratory: Negative.    Cardiovascular: Negative.   Gastrointestinal: Negative.   Endocrine: Negative.   Genitourinary: Negative.   Musculoskeletal:  Positive for neck pain and neck stiffness. Negative for arthralgias, back pain and myalgias.  Skin: Negative.   Allergic/Immunologic: Negative.   Neurological: Negative.  Negative for dizziness, weakness, light-headedness and numbness.  Hematological: Negative.   Psychiatric/Behavioral: Negative.    All other systems reviewed and are negative.      Objective:   Vitals:   10/28/23 1122  BP: 126/82  Pulse: 83  Resp: 16  SpO2: 98%  Weight: 148 lb (67.1 kg)  Height: 5' (1.524 m)    Body mass index is 28.9 kg/m.  Physical Exam Vitals and nursing note reviewed.  Constitutional:      Appearance: She is well-developed.  HENT:     Head: Normocephalic and atraumatic.     Nose: Nose normal.  Eyes:     General:        Right eye: No discharge.        Left eye: No discharge.     Conjunctiva/sclera: Conjunctivae normal.  Neck:     Trachea: Trachea and phonation normal. No tracheal deviation.     Comments: Rotation to the left about 30 degrees Rotation to the right very limited roughly 15 degrees And limited flexion and extension No cervical spine midline tenderness, no step-off no paraspinal or upper trapezius tenderness or increased muscle tension Cardiovascular:     Rate and Rhythm: Normal rate and regular rhythm.  Pulmonary:     Effort: Pulmonary effort is normal. No respiratory distress.     Breath sounds: No stridor.  Musculoskeletal:     Cervical back: No edema, erythema,  signs of trauma or crepitus. Pain with movement present. No spinous process tenderness or muscular tenderness. Decreased range of motion.  Skin:    General: Skin is warm and dry.     Findings: No rash.  Neurological:     Mental Status: She is alert.     Motor: No abnormal muscle tone.     Coordination: Coordination normal.     Comments: Normal sensation to light touch in bilateral upper extremities  Psychiatric:        Behavior: Behavior normal.      Results for orders placed or performed in visit on 09/28/23  Microalbumin / creatinine urine ratio   Collection Time: 07/23/23 12:00 AM  Result Value Ref Range   Microalb Creat Ratio 300  Assessment & Plan:  1. Neck pain on right side (Primary) No injury, pain and decreased range of motion for more than 3 weeks She does not have any current radiculopathy -grossly intact sensation and strength to bilateral upper extremities On exam very limited rotation bilaterally and flexion and extension No midline or paraspinal cervical tenderness, no step-off The place that she points high up on her right posterior neck at the base of her skull, no ttp in that area With duration of sx and limited ROM will get xrays She is est with ortho and PT for Low back pathology - most recently she was recommended to have surgery or injections and she has done neither Reviewed her health, meds, labs and feel mobic  may be safe to try short term, recommended heat therapy, tylenol  OTC, and f/up with PT for eval and Tx She can continue to use the muscle relaxers if they give her relief - DG Cervical Spine Complete - Ambulatory referral to Physical Therapy - meloxicam  (MOBIC ) 15 MG tablet; Take 0.5-1 tablets (7.5-15 mg total) by mouth daily.  Dispense: 30 tablet; Refill: 0      Michelene Cower, PA-C 10/28/23 11:44 AM

## 2023-10-28 NOTE — Patient Instructions (Signed)
 Go to outpatient imaging and get the neck x-ray - the radiology reports have been slow.  Recommend you keep doing heat therapy to the muscles, gentle stretching until you get into PT and get their assessment and treatment plan.  You can use topical bengay or diclofenac  gel and tylenol  [phone] mg up to four times a day

## 2023-10-31 DIAGNOSIS — H35371 Puckering of macula, right eye: Secondary | ICD-10-CM | POA: Diagnosis not present

## 2023-10-31 DIAGNOSIS — Z961 Presence of intraocular lens: Secondary | ICD-10-CM | POA: Diagnosis not present

## 2023-10-31 DIAGNOSIS — H04123 Dry eye syndrome of bilateral lacrimal glands: Secondary | ICD-10-CM | POA: Diagnosis not present

## 2023-10-31 DIAGNOSIS — H401132 Primary open-angle glaucoma, bilateral, moderate stage: Secondary | ICD-10-CM | POA: Diagnosis not present

## 2023-10-31 DIAGNOSIS — H43813 Vitreous degeneration, bilateral: Secondary | ICD-10-CM | POA: Diagnosis not present

## 2023-10-31 DIAGNOSIS — H02834 Dermatochalasis of left upper eyelid: Secondary | ICD-10-CM | POA: Diagnosis not present

## 2023-10-31 DIAGNOSIS — H02831 Dermatochalasis of right upper eyelid: Secondary | ICD-10-CM | POA: Diagnosis not present

## 2023-10-31 NOTE — Telephone Encounter (Signed)
Copied from CRM [id number]. Topic: E2C2 Error Reporting - Other >> Oct 31, 2023  4:22 PM Haroldine Laws wrote: Reason for CRM: Marcelino Duster with [facility] called with recommendation for a statin drug  [phone]

## 2023-11-01 NOTE — Telephone Encounter (Signed)
Copied from CRM [id number]. Topic: E2C2 Error Reporting - Other >> Oct 31, 2023  4:22 PM Haroldine Laws wrote: Reason for CRM: Marcelino Duster with [facility] called with recommendation for a statin drug  [phone]

## 2023-11-14 ENCOUNTER — Encounter: Payer: Self-pay | Admitting: Family Medicine

## 2023-11-15 NOTE — Therapy (Unsigned)
 OUTPATIENT PHYSICAL THERAPY EVALUATION   Patient Name: Carolyn Campbell MRN: [id number] DOB:1956-01-19, 68 y.o., female Today's Date: 11/15/2023  END OF SESSION:   Past Medical History:  Diagnosis Date   Abdominal pain, epigastric    Acute metabolic encephalopathy 07/22/2022   Allergic rhinitis    Arthritis    Asthma    Breast cancer (HCC) 2006   LT LUMPECTOMY   Cataract    Clotting disorder (HCC)    Cognitive deficits as late effect of cerebrovascular disease    Convulsions, unspecified convulsion type (HCC) 09/24/2022   COPD (chronic obstructive pulmonary disease) (HCC)    COVID-19 virus infection 04/2021   Crohn's disease (HCC) 05/21/2015   FOLLOWED BY GI   Crohn's disease of both small and large intestine with rectal bleeding (HCC) 12/04/2014   Depression    Currently taking zoloft.   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 05/2021 Echo: EF 60-65%, no rwma, nl RV fxn.   History of kidney stones    Hypercholesterolemia 01/18/2007   Hyperlipidemia    Hypertension    Hypothyroidism    Low back pain radiating to left leg 06/17/2021   Nonobstructive CAD (coronary artery disease)    a. 10/2012 Cath: diffuse minor irregs-->med rx; b. 08/2021 Cor CTA: Ca2+ = 55.6 (77th%'ile), LAD 25p, LCX <25p. No signif non-cardiac findings.   Occlusion, cerebral artery    NOS w/infarction   Osteoporosis    Overweight (BMI 25.0-29.9) 07/17/2022   Personal history of radiation therapy 2006   BREAST CA   Pulmonary embolism (HCC)    a.04/2021 CTA Chest: Small filling defects are noted in upper and lower lobe branches of L PA-->acute PE-->eliquis. *PE occurred in context of COVID infxn.   Stroke [facility])    Ulcer    Past Surgical History:   Procedure Laterality Date   BREAST BIOPSY Left 2006   POS   BREAST LUMPECTOMY Left 09/20/2004   positive   BREAST SURGERY Left    malignant biopsy   CARDIAC CATHETERIZATION     COLONOSCOPY  2015   COLONOSCOPY WITH PROPOFOL N/A 06/08/2017   Procedure: COLONOSCOPY WITH PROPOFOL;  Surgeon: Toney Reil, MD;  Location: [facility];  Service: Gastroenterology;  Laterality: N/A;   COLONOSCOPY WITH PROPOFOL N/A 03/08/2019   Procedure: COLONOSCOPY WITH PROPOFOL;  Surgeon: Toney Reil, MD;  Location: [facility];  Service: Gastroenterology;  Laterality: N/A;   COLONOSCOPY WITH PROPOFOL N/A 07/16/2020   Procedure: COLONOSCOPY WITH PROPOFOL;  Surgeon: Toney Reil, MD;  Location: [facility] ENDOSCOPY;  Service: Gastroenterology;  Laterality: N/A;   COLONOSCOPY WITH PROPOFOL N/A 09/30/2022   Procedure: COLONOSCOPY WITH PROPOFOL;  Surgeon: Toney Reil, MD;  Location: [facility] ENDOSCOPY;  Service: Gastroenterology;  Laterality: N/A;   ESOPHAGOGASTRODUODENOSCOPY  2015   ESOPHAGOGASTRODUODENOSCOPY (EGD) WITH PROPOFOL N/A 06/08/2017   Procedure: ESOPHAGOGASTRODUODENOSCOPY (EGD) WITH PROPOFOL;  Surgeon: Toney Reil, MD;  Location: [facility];  Service: Gastroenterology;  Laterality: N/A;   ESOPHAGOGASTRODUODENOSCOPY (EGD) WITH PROPOFOL N/A 03/08/2019   Procedure: ESOPHAGOGASTRODUODENOSCOPY (EGD) WITH PROPOFOL;  Surgeon: Toney Reil, MD;  Location: [facility] ENDOSCOPY;  Service: Gastroenterology;  Laterality: N/A;   EYE SURGERY     FINGER SURGERY     Right  small finger   FRACTURE SURGERY     GIVENS CAPSULE STUDY  2015   TUBAL LIGATION     Patient Active Problem List   Diagnosis Date Noted   REM behavioral disorder 03/14/2023   Excessive daytime sleepiness 03/14/2023   Crohn's disease of large intestine without complication (HCC) 09/30/2022   Convulsions, unspecified convulsion type (HCC) 09/24/2022   Encounter for current long-term use of  anticoagulants 09/24/2022   MDD (major depressive disorder), recurrent episode, mild (HCC) 08/18/2022   Restless leg syndrome 08/18/2022   Pulmonary embolism (HCC) 07/16/2022   Pain in limb 11/10/2021   CAD (coronary artery disease) 08/06/2021   Glaucoma 02/23/2021   Other long term (current) drug therapy 01/22/2021   Varicose veins of both lower extremities with pain 07/19/2018   Arthritis of right hand 12/21/2017   Major depression in remission (HCC) 12/21/2017   Fibrocystic breast changes 06/22/2017   Osteopenia 10/18/2016   Atherosclerosis of aorta (HCC) 12/19/2015   Vitreous degeneration 05/21/2015   Glaucoma associated with chamber angle anomalies 05/21/2015   H/O malignant neoplasm of breast 05/21/2015   Solitary pulmonary nodule 11/07/2012   Cognitive deficits as late effect of cerebrovascular disease 01/20/2010   CVA, old, hemiparesis (HCC) 04/28/2009   Acquired hypothyroidism 06/12/2008   HLD (hyperlipidemia) 01/18/2007   Primary hypertension 01/18/2007    PCP: Danelle Berry, PA-C   REFERRING PROVIDER: Danelle Berry, PA-C   REFERRING DIAG: neck pain on right side  THERAPY DIAG:  No diagnosis found.  Rationale for Evaluation and Treatment: Rehabilitation  ONSET DATE: ***  SUBJECTIVE:                                                                                                                                                                                                         SUBJECTIVE STATEMENT: *** Hand dominance: {MISC; OT HAND DOMINANCE:[phone]}  PERTINENT HISTORY:  Patient is a 68 y.o. female who presents to outpatient physical therapy with a referral for medical diagnosis neck pain on right side. This patient's chief complaints consist of ***, leading to the following functional deficits: ***. Relevant past medical history and comorbidities include Solitary pulmonary nodule; Vitreous degeneration; CVA, old, hemiparesis (HCC); Acquired hypothyroidism;  Glaucoma associated with chamber angle anomalies; Cognitive deficits as late effect of cerebrovascular disease;  Primary hypertension; Atherosclerosis of aorta (HCC); Fibrocystic breast changes; Arthritis of right hand;  Varicose veins of both lower extremities with pain; CAD (coronary artery disease); Pain in limb; Pulmonary embolism (HCC); MDD (major depressive disorder); Restless leg syndrome; REM behavioral disorder; Excessive daytime sleepiness;  Abdominal pain, epigastric, Acute metabolic encephalopathy (07/22/2022), Allergic rhinitis, Arthritis, Asthma, Breast cancer (HCC) (2006), Cataract, Clotting disorder (HCC),Convulsions, unspecified convulsion type (HCC) (09/24/2022), COPD (chronic obstructive pulmonary disease) (HCC), Crohn's disease (HCC) (05/21/2015), Crohn's disease of both small and large intestine with rectal bleeding (HCC) (12/04/2014), Dermatitis, eczematoid (05/21/2015), 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 She has a past surgical history that includes Tubal ligation; Esophagogastroduodenoscopy (2015); Colonoscopy (2015); Givens capsule study (2015); Finger surgery; Cardiac catheterization; Fracture surgery; Breast surgery (Left);   Breast lumpectomy (Left, 09/20/2004); Eye surgery. Patient denies hx of {redflags:27294}  PAIN: Are you having pain? Yes NPRS: Current: ***/10,  Best: ***/10, Worst: ***/10. Pain location: *** Pain description: *** Aggravating factors: *** Relieving factors: ***   FUNCTIONAL LIMITATIONS: ***  LEISURE: ***  PRECAUTIONS: {Therapy  precautions:24002}   WEIGHT BEARING RESTRICTIONS: {Yes ***/No:24003}  FALLS:  Has patient fallen in last 6 months? {fallsyesno:27318}  LIVING ENVIRONMENT: Lives with: {OP[facility] lives with:25569::"lives with their family"} Lives in: {Lives in:25570} Stairs: {opstairs:27293} Has following equipment at home: {Assistive devices:23999}  OCCUPATION: ***  PLOF: {PLOF:24004}  PATIENT GOALS: ***  NEXT MD VISIT: ***  OBJECTIVE  DIAGNOSTIC FINDINGS:  Cervical spine xray report on 10/28/2023:  EXAM: CERVICAL SPINE - COMPLETE 6 VIEW   COMPARISON:  None Available.   FINDINGS: There is no evidence of cervical spine fracture or prevertebral soft tissue swelling. Alignment is normal. Calcification anterior annuli at C4-5 and C5-6 consistent with early degenerative disc disease.   IMPRESSION: Degenerate changes.  No acute osseous abnormalities.  SELF-REPORTED FUNCTION Neck Disability Index (NDI): ***% (range 0-100%)  OBSERVATION/INSPECTION Posture Posture (seated): forward head, rounded shoulders, slumped in sitting.  Posture (standing): *** Posture correction: *** Anthropometrics Tremor: none Body composition: *** Muscle bulk: *** Skin: The incision sites appear to be healing well with no excessive redness, warmth, drainage or signs of infection present.  *** Edema: *** Functional Mobility Bed mobility: *** Transfers: *** Gait: grossly WFL for household and short community ambulation. More detailed gait analysis deferred to later date as needed. *** Stairs: ***  SPINE MOTION  LUMBAR SPINE AROM *Indicates pain Flexion: *** Extension: *** Side Flexion:   R ***  L *** Rotation:  R *** L *** Side glide:  R *** L ***    NEUROLOGICAL  Upper Motor Neuron Screen Babinski, Hoffman's and Clonus (ankle) negative bilaterally.  Dermatomes C2-T1 appears equal and intact to light touch except the following: *** L2-S2 appears equal and intact to light touch except the  following: *** Deep Tendon Reflexes R/L  ***+/***+ Biceps brachii reflex (C5, C6) ***+/***+ Brachioradialis reflex (C6) ***+/***+ Triceps brachii reflex (C7) ***+/***+ Quadriceps reflex (L4) ***+/***+ Achilles reflex (S1)  SPINE MOTION  CERVICAL SPINE AROM *Indicates pain Flexion: *** Extension: *** Side Flexion:   R ***  L *** Rotation:  R *** L *** Protrusion: *** Retraction: ***   PERIPHERAL JOINT MOTION (in degrees)  ACTIVE RANGE OF MOTION (AROM) *Indicates pain Date Date Date  Joint/Motion R/L R/L R/L  Shoulder     Flexion / / /  Extension / / /  Abduction  / / /  External rotation / / /  Internal rotation / / /  Elbow     Flexion  / / /  Extension  / / /  Wrist     Flexion / / /  Extension  / / /  Radial deviation / / /  Ulnar deviation / / /  Pronation / / /  Supination / / /  Hip     Flexion / / /  Extension  / / /  Abduction / / /  Adduction / / /  External rotation / / /  Internal rotation  / / /  Knee     Extension / / /  Flexoin / / /  Ankle/Foot     Dorsiflexion (knee ext) / / /  Dorsiflexion (knee flex) / / /  Plantarflexion / / /  Everison / / /  Inversion / / /  Great toe extension / / /  Great toe flexion / / /  Comments:   PASSIVE RANGE OF MOTION (PROM) *Indicates pain Date Date Date  Joint/Motion R/L R/L R/L  Shoulder     Flexion / / /  Extension / / /  Abduction  / / /  External rotation / / /  Internal rotation / / /  Elbow     Flexion  / / /  Extension  / / /  Wrist     Flexion / / /  Extension  / / /  Radial deviation / / /  Ulnar deviation / / /  Pronation / / /  Supination / / /  Hip     Flexion  / / /  Extension  / / /  Abduction / / /  Adduction / / /  External rotation / / /  Internal rotation  / / /  Knee     Extension / / /  Flexion / / /  Ankle/Foot     Dorsiflexion (knee ext) / / /  Dorsiflexion (knee flex) / / /  Plantarflexion / / /  Everison / / /  Inversion / / /  Great toe  extension / / /  Great toe flexion / / /  Comments:   MUSCLE PERFORMANCE (MMT):  *Indicates pain Date Date Date  Joint/Motion R/L R/L R/L  Shoulder     Flexion / / /  Abduction (C5) / / /  External rotation / / /  Internal rotation / / /  Extension / / /  Elbow     Flexion (C6) / / /  Extension (C7) / / /  Wrist     Flexion (C7) / / /  Extension (C6) / / /  Radial deviation / / /  Ulnar deviation (C8) / / /  Pronation / / /  Supination / / /  Hand     Thumb extension (C8) / / /  Finger abduction (T1) / / /  Grip (C8) / / /  Hip     Flexion (L1, L2) / / /  Extension (knee ext) / / /  Extension (knee flex) / / /  Abduction / / /  Adduction / / /  External rotation / / /  Internal rotation  / / /  Knee     Extension (L3) / / /  Flexion (S2) / / /  Ankle/Foot     Dorsiflexion (L4) / / /  Great toe extension (L5) / / /  Eversion (S1) / / /  Plantarflexion (S1) / / /  Inversion / / /  Pronation / / /  Great toe flexion / / /  Comments:   SPECIAL TESTS:  .Neurodynamictests .NeurodynamicUE .NeurodynamicLE .CspineInstability .CSPINESPECIALTESTS .SHOULDERSPECIALTESTCLUSTERS .HIPSPECIALTESTS .SIJSPECIALTESTS   SHOULDER SPECIAL TESTS RTC, Impingement, Anterior Instability (macrotrauma), Labral Tear: Painful arc test: R = ***, L = ***. Drop arm test: R = ***, L = ***. Hawkins-Kennedy test: R = ***, L = ***. Infraspinatus test: R = ***, L = ***. Apprehension test: R = ***, L = ***. Relocation test: R = ***, L = ***. Active compression test: R = ***, L = ***.  ACCESSORY MOTION: ***  PALPATION: ***  SUSTAINED POSITIONS TESTING:  ***  REPEATED MOTIONS TESTING: ***  FUNCTIONAL/BALANCE TESTS: Five Time Sit to Stand (5TSTS): *** seconds Functional Gait Assessment (FGA): ***/30 (see details above) Ten meter walking trial ( ): *** m/s Six Minute Walk Test ( ): *** feet Timed Up and Go (TUG): *** seconds   Dynamic Gait Index: ***/24 BERG  Balance Scale: ***/56 Tinetti/POMA: ***/28 Timed Up and GO: *** seconds (average of 3 trials) Trial 1: *** Trial 2: *** Trial 3: *** Romberg test: -Narrow stance, eyes open: *** seconds -Narrow stance, eyes closed: *** seconds Sharpened Romberg test: -Tandem stance, eyes open: *** seconds -Tandem stance, eyes closed: *** seconds  Narrow stance, firm surface, eyes open: *** seconds Narrow stance, firm surface, eyes closed: *** seconds Narrow stance, compliant surface, eyes open: *** seconds Narrow stance, compliant surface, eyes closed: *** seconds Single leg stance, firm surface, eyes open: R= *** seconds, L= *** seconds Single leg stance, compliant surface, eyes open: R= *** seconds, L= *** seconds Gait speed: *** m/s Functional reach test: *** inches      TREATMENT                                                                                                                               PATIENT EDUCATION:  Education details: *** Person educated: {Person educated:25204} Education method: {Education Method:25205} Education comprehension: {Education Comprehension:25206}  HOME EXERCISE PROGRAM: ***  ASSESSMENT:  CLINICAL IMPRESSION: Patient is a 68 y.o. female referred to outpatient physical therapy with a medical diagnosis of neck pain on right side who presents with signs and symptoms consistent with ***. Patient presents with significant *** impairments that are limiting ability to complete *** without difficulty. Patient will benefit from skilled physical therapy intervention to address current body structure impairments and activity limitations to improve function and work towards goals set in current POC in order to return to prior level of function or maximal functional improvement.    OBJECTIVE IMPAIRMENTS: {opptimpairments:25111}.   ACTIVITY LIMITATIONS: {activitylimitations:27494}  PARTICIPATION LIMITATIONS: {participationrestrictions:25113}  PERSONAL  FACTORS: {Personal factors:25162} are also affecting patient's functional outcome.   REHAB POTENTIAL: {rehabpotential:25112}  CLINICAL DECISION MAKING: {clinical decision making:25114}  EVALUATION COMPLEXITY: {Evaluation complexity:25115}   GOALS: Goals reviewed with patient? {yes/no:20286}  SHORT TERM GOALS: Target date: 11/29/2023  Patient will be independent with initial home exercise program for self-management of symptoms. Baseline: {HEPbaseline4:27310} (11/15/23); Goal status: INITIAL  Patient will  demonstrate improvement in Patient Specific Functional Scale (PSFS) of equal or greater than 3 points to reflect clinically significant improvement in patient's most valued functional activities.. Baseline: {Sarasgoalbaseline:32234} (11/15/23); Goal status: INITIAL  3. Patient will demonstrate improvement in Neck Disability Index (NDI) by equal or greater than 10 percentage points (MCID) to reflect clinically significant improvement in overall condition and self-reported functional ability. Baseline: {Sarasgoalbaseline:32234} (11/15/23); Goal status: INITIAL  4. Patient will report improvement in NPRS of equal or greater than 2 points during functional activities to improve their abilitly to complete community, work and/or recreational activities with less limitation. Baseline: ***/10 (11/15/23); Goal status: INITIAL   LONG TERM GOALS: Target date: 02/07/2024  Patient will be independent with a long-term home exercise program for self-management of symptoms.  Baseline: {HEPbaseline4:27310} (11/15/23); Goal status: INITIAL  2.  Patient will demonstrate improved Neck Disability Index (NDI) to equal or less than 10% to demonstrate improvement in overall condition and self-reported functional ability.  Baseline: {Sarasgoalbaseline:32234} (11/15/23); Goal status: INITIAL  3.  *** Baseline: {Sarasgoalbaseline:32234} (11/15/23); Goal status: INITIAL  4.  *** Baseline:  {Sarasgoalbaseline:32234} (11/15/23); Goal status: INITIAL  5.  Patient will demonstrate improvement in Patient Specific Functional Scale (PSFS) of equal or greater than 8/10 points to reflect clinically significant improvement in patient's most valued functional activities. Baseline: {Sarasgoalbaseline:32234} (11/15/23); Goal status: INITIAL  6.  Patient will report NPRS equal or less than 3/10 during functional activities during the last 2 weeks to improve their abilitly to complete community, work and/or recreational activities with less limitation. Baseline: ***/10 (11/15/23); Goal status: INITIAL    PLAN:  PT FREQUENCY: {rehab frequency:25116}  PT DURATION: {rehab duration:25117}  PLANNED INTERVENTIONS: {rehab planned interventions:25118::"97110-Therapeutic exercises","97530- Therapeutic [phone]- Neuromuscular re-education","97535- Self JXBJ","47829- Manual therapy"}  PLAN FOR NEXT SESSION: ***   Jalayiah Bibian R. Ilsa Iha, PT, DPT 11/15/23, 2:00 PM  [facility] [address] [place], [place] [address] P: [phone] I F: [phone]

## 2023-11-16 ENCOUNTER — Encounter: Payer: Self-pay | Admitting: Physical Therapy

## 2023-11-16 ENCOUNTER — Ambulatory Visit: Payer: Medicare Other | Attending: Physical Medicine & Rehabilitation | Admitting: Physical Therapy

## 2023-11-16 DIAGNOSIS — M542 Cervicalgia: Secondary | ICD-10-CM | POA: Insufficient documentation

## 2023-11-16 NOTE — Therapy (Unsigned)
 OUTPATIENT PHYSICAL THERAPY EVALUATION   Patient Name: Carolyn Campbell MRN: [id number] DOB:1956-04-22, 68 y.o., female Today's Date: 11/17/2023  END OF SESSION:  PT End of Session - 11/16/23 1844     Visit Number 1    Number of Visits 17    Authorization Type [employer] - reporting period from 11/16/23    Authorization Time Period VL based on auth    Progress Note Due on Visit 10    PT Start Time 1119    PT Stop Time 1216    PT Time Calculation (min) 57 min    Activity Tolerance Patient tolerated treatment well;Patient limited by pain    Behavior During Therapy [facility] for tasks assessed/performed             Past Medical History:  Diagnosis Date   Abdominal pain, epigastric    Acute metabolic encephalopathy 07/22/2022   Allergic rhinitis    Arthritis    Asthma    Breast cancer (HCC) 2006   LT LUMPECTOMY   Cataract    Clotting disorder (HCC)    Cognitive deficits as late effect of cerebrovascular disease    Convulsions, unspecified convulsion type (HCC) 09/24/2022   COPD (chronic obstructive pulmonary disease) (HCC)    COVID-19 virus infection 04/2021   Crohn's disease (HCC) 05/21/2015   FOLLOWED BY GI   Crohn's disease of both small and large intestine with rectal bleeding (HCC) 12/04/2014   Depression    Currently taking zoloft.   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 05/2021 Echo: EF 60-65%, no rwma, nl RV fxn.   History of kidney stones    Hypercholesterolemia 01/18/2007   Hyperlipidemia    Hypertension    Hypothyroidism    Low back pain radiating to left leg 06/17/2021   Nonobstructive CAD (coronary artery disease)    a. 10/2012 Cath: diffuse minor irregs-->med rx; b. 08/2021 Cor CTA: Ca2+ = 55.6 (77th%'ile),  LAD 25p, LCX <25p. No signif non-cardiac findings.   Occlusion, cerebral artery    NOS w/infarction   Osteoporosis    Overweight (BMI 25.0-29.9) 07/17/2022   Personal history of radiation therapy 2006   BREAST CA   Pulmonary embolism (HCC)    a.04/2021 CTA Chest: Small filling defects are noted in upper and lower lobe branches of L PA-->acute PE-->eliquis. *PE occurred in context of COVID infxn.   Stroke [facility])    Ulcer    Past Surgical History:  Procedure Laterality Date   BREAST BIOPSY Left 2006   POS   BREAST LUMPECTOMY Left 09/20/2004   positive   BREAST SURGERY Left    malignant biopsy   CARDIAC CATHETERIZATION     COLONOSCOPY  2015   COLONOSCOPY WITH PROPOFOL N/A 06/08/2017   Procedure: COLONOSCOPY WITH PROPOFOL;  Surgeon: Toney Reil, MD;  Location: [facility];  Service: Gastroenterology;  Laterality: N/A;   COLONOSCOPY WITH PROPOFOL N/A 03/08/2019   Procedure: COLONOSCOPY WITH PROPOFOL;  Surgeon: Toney Reil, MD;  Location: [facility] END[facility];  Service: Gastroenterology;  Laterality: N/A;   COLONOSCOPY WITH PROPOFOL N/A 07/16/2020   Procedure: COLONOSCOPY WITH PROPOFOL;  Surgeon: Toney Reil, MD;  Location: [facility] ENDOSCOPY;  Service: Gastroenterology;  Laterality: N/A;   COLONOSCOPY WITH  PROPOFOL N/A 09/30/2022   Procedure: COLONOSCOPY WITH PROPOFOL;  Surgeon: Toney Reil, MD;  Location: [facility] ENDOSCOPY;  Service: Gastroenterology;  Laterality: N/A;   ESOPHAGOGASTRODUODENOSCOPY  2015   ESOPHAGOGASTRODUODENOSCOPY (EGD) WITH PROPOFOL N/A 06/08/2017   Procedure: ESOPHAGOGASTRODUODENOSCOPY (EGD) WITH PROPOFOL;  Surgeon: Toney Reil, MD;  Location: [facility];  Service: Gastroenterology;  Laterality: N/A;   ESOPHAGOGASTRODUODENOSCOPY (EGD) WITH PROPOFOL N/A 03/08/2019   Procedure: ESOPHAGOGASTRODUODENOSCOPY (EGD) WITH PROPOFOL;  Surgeon: Toney Reil, MD;  Location: [facility] ENDOSCOPY;  Service: Gastroenterology;  Laterality:  N/A;   EYE SURGERY     FINGER SURGERY     Right small finger   FRACTURE SURGERY     GIVENS CAPSULE STUDY  2015   TUBAL LIGATION     Patient Active Problem List   Diagnosis Date Noted   REM behavioral disorder 03/14/2023   Excessive daytime sleepiness 03/14/2023   Crohn's disease of large intestine without complication (HCC) 09/30/2022   Convulsions, unspecified convulsion type (HCC) 09/24/2022   Encounter for current long-term use of anticoagulants 09/24/2022   MDD (major depressive disorder), recurrent episode, mild (HCC) 08/18/2022   Restless leg syndrome 08/18/2022   Pulmonary embolism (HCC) 07/16/2022   Pain in limb 11/10/2021   CAD (coronary artery disease) 08/06/2021   Glaucoma 02/23/2021   Other long term (current) drug therapy 01/22/2021   Varicose veins of both lower extremities with pain 07/19/2018   Arthritis of right hand 12/21/2017   Major depression in remission (HCC) 12/21/2017   Fibrocystic breast changes 06/22/2017   Osteopenia 10/18/2016   Atherosclerosis of aorta (HCC) 12/19/2015   Vitreous degeneration 05/21/2015   Glaucoma associated with chamber angle anomalies 05/21/2015   H/O malignant neoplasm of breast 05/21/2015   Solitary pulmonary nodule 11/07/2012   Cognitive deficits as late effect of cerebrovascular disease 01/20/2010   CVA, old, hemiparesis (HCC) 04/28/2009   Acquired hypothyroidism 06/12/2008   HLD (hyperlipidemia) 01/18/2007   Primary hypertension 01/18/2007    PCP: Danelle Berry, PA-C  REFERRING PROVIDER: Danelle Berry, PA-C  REFERRING DIAG: Neck pain on right side  Rationale for Evaluation and Treatment: Rehabilitation  THERAPY DIAG:  Cervicalgia  ONSET DATE: Over a month ago, insidious onset neck pain  SUBJECTIVE:                                                                                                                                                                                           SUBJECTIVE STATEMENT: Patient  arrives to initial evaluation with chief concern of insidious onset right sided neck pain that began over a month ago. When the neck pain first started, she went to  urgent care where she was prescribed muscle relaxants. She reports the muscle relaxants did not have any impact on her neck pain. Patient reports she has had similar neck pain before but it originated in her head and travelled to her neck on the right side. She states her neck pain has been staying the same since onset. She had imaging done that showed degenerative disc disease in her cervical spine.   She denies any numbness or tingling in her upper extremities. The pain in her neck is local to the right side of her neck and top of her shoulder/base of right side of neck. She states her sleep has been impacted by her neck pain and she has a difficult time finding a comfortable position. She takes Tylenol PM for pain management and to help her sleep. She also uses a heating pad on her neck to help with the pain.   She reports she has very limited motion in her neck but she is able to do everything she usually needs to do, she just has to turn her whole body instead of just her head. She typically tries to avoid turning her head because that is what makes the pain worse. She describes that the pain is not as bothersome when she is resting her head in a neutral position. She states she occasionally gets throbbing headache pain on the right side of her head, but that does not happen often. She reports exercising has become more difficult because they involve turning her head and she has not gone to the gym since her neck pain started. She has been able to continue walking and doing her leg exercises. She states driving has become more difficult and she must turn her whole body to look over her shoulders while driving. Patient reports she wants her neck to be better and wants to be able to "do what she wants to do" without neck pain.    PERTINENT  HISTORY:  Patient is a 68 y.o. female who presents to outpatient physical therapy with a referral for medical diagnosis of neck pain on right side. This patient's chief complaints consist of insidious onset acute neck pain on the right side of her neck from the base of her skull down the the top of her shoulder, leading to the following functional deficits: difficulty and pain with tasks moving her head in all directions (particularly turning), driving, exercising, and sewing. Relevant past medical history and comorbidities include asthma, hx of breast cancer, clotting disorder (HCC), COPD, depression, glaucoma, convulsions (unspecified convulsion type), osteoporosis, nonobstructive coronary artery disease, hx of pulmonary embolism (HCC).      PAIN:  Are you having pain? Yes NPRS: Current: 1/10 (at rest, neutral head position),  Best: 1/10, Worst: 6/10. Pain location: right side of neck, suboccipitals (base of skull), down right side to UT region (top of shoulder/base of neck)  Pain description: aching in neck, occasional sharp/throbbing headache pain on right side of head Aggravating factors: turning head to both directions Relieving factors: heating pad, Tylenol-PM before bed (so she can sleep)   FUNCTIONAL LIMITATIONS: turning head, driving, exercising , sewing   LEISURE: puzzles, sewing, exercising at the gym   PRECAUTIONS: None  RED FLAGS: Patient denies hx of seizures, heart problems, diabetes, unexplained weight loss, unexplained changes in bowel or bladder problems, unexplained stumbling or dropping things, and spinal surgery    Patient has hx of breast cancer, blood clots in lungs, and osteoporosis.   WEIGHT BEARING RESTRICTIONS: No  FALLS:  Has patient fallen in last 6 months? No   OCCUPATION: No working, retired   PLOF: Independent  PATIENT GOALS: Wants her neck to be better, wants to be able to "do what she wants to do"  NEXT MD VISIT:   OBJECTIVE:  Note: Objective  measures were completed at Evaluation unless otherwise noted.  DIAGNOSTIC FINDINGS:  X-RAY OF CERVICAL SPINE (Report from imaging completed on 10/28/2023):  FINDINGS: There is no evidence of cervical spine fracture or prevertebral soft tissue swelling. Alignment is normal. Calcification anterior annuli at C4-5 and C5-6 consistent with early degenerative disc disease.   IMPRESSION: Degenerate changes.  No acute osseous abnormalities.   SELF-REPORTED FUNCTION Patient Specific Functional Scale (PSFS)  Driving: 3 Exercising: 3 Sewing: 3 Average: 3  OBSERVATION/INSPECTION Posture Posture (seated): Slight rounded shoulders in sitting. Unable to turn her head either direction.   SPINE MOTION  CERVICAL SPINE AROM *Indicates pain Flexion: 18* Extension: 19* Side Flexion:   R 17* (painful on left side of neck)  L 16* (painful on right side)  Rotation:  R 18* L 23*  Protrusion: 4 cm * (pain on right side)  Retraction: 3 cm  Comment: Cervical AROM and PROM assessed in sitting. More ROM available when assessed passively with less pain on right side of her neck.    Compression: Negative  Distraction: Negative (pain with pressure on her right ear but no change in neck pain)  Spurling's: Negative     PERIPHERAL JOINT MOTION (in degrees)  ACTIVE RANGE OF MOTION (AROM) *Indicates pain 11/16/23 Date Date  Joint/Motion R/L R/L R/L  Shoulder     Flexion WFL / /  Extension / / /  Abduction  WFL / /  External rotation WFL / /  Internal rotation WFL / /  Comments: Gross screen of shoulder ROM was [facility] and did not reproduce concordant neck pain.    ACCESSORY MOTION: CPAs C2-C5 tenderness and reproduction of concordant pain on right side of neck - no change in symptoms with repeated CPAs   PALPATION: Tenderness to right suboccipitals, upper cervical paraspinals, SCM (muscle belly and even more pain at insertion point on mastoid process), scalenes  No tenderness to palpation on the  left side of neck    TREATMENT:                                                                                                                        Therapeutic exercise: therapeutic exercises that incorporate ONE parameter at one or more areas of the body to centralize symptoms, develop strength and endurance, range of motion, and flexibility required for successful completion of functional activities. Seated cervical spine rotation SNAG with pillow case 1x5 each side Education on diagnosis, prognosis, POC, anatomy and physiology of current condition.  Education on HEP including handout   Pt required multimodal cuing for proper technique and to facilitate improved neuromuscular control, strength, range of motion, and functional ability resulting in improved performance and form.  PATIENT EDUCATION:  Education details: Education on diagnosis/prognosis, plan of care, anatomy/physiology of current condition, HEP purpose/form.  Person educated: Patient Education method: Explanation, Demonstration, Verbal cues, and Handouts Education comprehension: verbalized understanding and needs further education  HOME EXERCISE PROGRAM: Access Code: [email or web address] URL: [email or web address] Date: 11/16/2023 Prepared by: Andreka Stucki [place]  Exercises - Seated Assisted Cervical Rotation with Towel  - 1 x daily - 7 x weekly - 1 sets - 10 reps - 2-3 second hold  ASSESSMENT:  CLINICAL IMPRESSION: Patient is a 68 y.o. female referred to outpatient physical therapy with a medical diagnosis of neck pain on right side who presents with signs and symptoms consistent with acute, constant neck pain on the right side with mobility deficits. Patient presents with significant cervical range of motion impairments and significant tissue tension in the musculature on the right side of her neck that are limiting her ability to complete all tasks that involve rotating her head, driving, exercising, and sewing  without difficulty. Cervical and upper extremity muscle performance testing deferred for future visit due to high irritability with cervical motion. Patient provided education and multimodal cuing on initial HEP including cervical rotational SNAGs with pillowcase. Patient demonstrated mild improvement in cervical rotation AROM following intervention. Patient will benefit from skilled physical therapy intervention to address current body structure impairments and activity limitations to improve function and work towards goals set in current POC in order to return to prior level of function or maximal functional improvement.    OBJECTIVE IMPAIRMENTS: decreased activity tolerance, decreased mobility, decreased ROM, hypomobility, and pain.   ACTIVITY LIMITATIONS: carrying, lifting, and sleeping  PARTICIPATION LIMITATIONS: driving and exercise (any task that involves rotating her head)  PERSONAL FACTORS: Past/current experiences and 3+ comorbidities: asthma, hx of breast cancer, clotting disorder (HCC), COPD, depression, glaucoma, convulsions (unspecified convulsion type), osteoporosis, nonobstructive coronary artery disease, hx of pulmonary embolism (HCC)  are also affecting patient's functional outcome.   REHAB POTENTIAL: Good  CLINICAL DECISION MAKING: Stable/uncomplicated  EVALUATION COMPLEXITY: Low   GOALS: Goals reviewed with patient? No  SHORT TERM GOALS: Target date: 11/30/2023  Patient will be independent with initial home exercise program for self-management of symptoms. Baseline: Initial HEP provided at IE (11/16/23); Goal status: INITIAL  Patient will demonstrate improvement in Patient Specific Functional Scale (PSFS) of equal or greater than 3 points to reflect clinically significant improvement in patient's most valued functional activities.. Baseline: 3/10 at visit #1 (11/16/23); Goal status: INITIAL   3. Patient will report improvement in NPRS of equal or greater than 2  points during functional activities to improve their abilitly to complete community, work and/or recreational activities with less limitation. Baseline: 6/10 at worst (11/16/23); Goal status: INITIAL   LONG TERM GOALS: Target date: 02/08/2024  Patient will be independent with a long-term home exercise program for self-management of symptoms.  Baseline: Initial HEP provided at IE (11/16/23); Goal status: INITIAL  2.  Patient will demonstrate improved cervical AROM to greater than or equal to 60 degrees for rotation and 30 degrees for flexion/extension/lateral flexion without pain to improve ability to drive and exercise with less difficulty.   Baseline: 18 degrees flexion, 19 degrees extension, 17 degrees R lateral flexion, 16 degrees L lateral flexion, 18 degrees R rotation, 23 degrees L rotation - pain with all ROM (11/16/23); Goal status: INITIAL  3.  Patient will demonstrate improvement in Patient Specific Functional Scale (PSFS) of equal or greater than 8/10 points to reflect clinically significant improvement in patient's most valued functional activities.  Baseline: 3/10 at visit #1 (11/16/23); Goal status: INITIAL  4.  Patient will report NPRS equal or less than 3/10 during functional activities during the last 2 weeks to improve their abilitly to complete community, work and/or recreational activities with less limitation. Baseline: 6/10 at worst (11/16/23); Goal status: INITIAL   PLAN:  PT FREQUENCY: 1-2x/week  PT DURATION: 8-12 weeks  PLANNED INTERVENTIONS: 97164- PT Re-evaluation, 97110-Therapeutic exercises, 97530- Therapeutic activity, 97112- Neuromuscular re-education, 97535- Self Care, 40981- Manual therapy, [id number]- Electrical stimulation (unattended), [phone]- Electrical stimulation (manual), Joint mobilization, Joint manipulation, Spinal manipulation, Spinal mobilization, Moist heat, and Biofeedback.  PLAN FOR NEXT SESSION: Vitals, cervical spine mobility/ROM, assess  cervical strength/endurance, manual therapy as needed, update HEP as appropriate    Barth Trella [place], SPT [employer] R. Ilsa Iha, PT, DPT 11/17/23, 7:30 PM  [facility] [address] [place], [place] [address] P: [phone] I F: [phone]

## 2023-11-21 ENCOUNTER — Telehealth: Payer: Self-pay

## 2023-11-21 DIAGNOSIS — K5010 Crohn's disease of large intestine without complications: Secondary | ICD-10-CM | POA: Diagnosis not present

## 2023-11-21 NOTE — Addendum Note (Signed)
 Addended by: Radene Knee L on: 11/21/2023 11:23 AM   Modules accepted: Orders

## 2023-11-21 NOTE — Telephone Encounter (Signed)
 Patient states she is not doing the injections but doing the infusions. The injections got denied back in October. Order the lab work and stool test. Patient will got get this done as soon as she can

## 2023-11-21 NOTE — Telephone Encounter (Signed)
 Please check with her if she is tolerating injectable form of Entyvio Recommend GI profile PCR and C. difficile toxin A and B, fecal calprotectin levels as well as CRP  RV

## 2023-11-21 NOTE — Telephone Encounter (Signed)
 Patient states for the last couple of days she has only ate a little bit of pasta salad. States symptoms began on Saturday.  Patient is having lower abdominal pain dull aching pain. The pain is a 5 out of 10 on the pain scale. The pain comes and goes and is worse when she tries to eat or drink. Patient is having diarrhea 3 to 4 times a day. Denies any nausea or vomiting.

## 2023-11-22 ENCOUNTER — Telehealth: Payer: Self-pay

## 2023-11-22 DIAGNOSIS — K5010 Crohn's disease of large intestine without complications: Secondary | ICD-10-CM

## 2023-11-22 DIAGNOSIS — R197 Diarrhea, unspecified: Secondary | ICD-10-CM

## 2023-11-22 LAB — C-REACTIVE PROTEIN: CRP: 136 mg/L — ABNORMAL HIGH (ref 0–10)

## 2023-11-22 NOTE — Telephone Encounter (Signed)
-----   Message from [facility] sent at 11/22/2023  1:51 PM EST ----- Morrie Sheldon,   Please inform Carolyn Campbell that her CRP is very high, let's check CBC with differential and have her drop off the stool specimen ASAP to rule out infection.  If there is no infection, will start her on prednisone  RV

## 2023-11-22 NOTE — Telephone Encounter (Addendum)
 Carolyn Campbell, her CRP is very high, lets do CBC with differential and have her drop off the stool specimen ASAP to rule out infection. If there is no infection, will start her on prednisone   Patient states she dropped of the stool specimens yesterday. She will go and get CBC done. She verbalized understanding of results

## 2023-11-22 NOTE — Telephone Encounter (Signed)
 Informed patient in previous telephone call

## 2023-11-23 ENCOUNTER — Telehealth: Payer: Self-pay

## 2023-11-23 ENCOUNTER — Emergency Department
Admission: EM | Admit: 2023-11-23 | Discharge: 2023-11-23 | Disposition: A | Discharge status: Home / Self Care | Attending: Emergency Medicine | Admitting: Emergency Medicine

## 2023-11-23 ENCOUNTER — Emergency Department

## 2023-11-23 ENCOUNTER — Other Ambulatory Visit: Payer: Self-pay

## 2023-11-23 DIAGNOSIS — K5090 Crohn's disease, unspecified, without complications: Secondary | ICD-10-CM | POA: Diagnosis not present

## 2023-11-23 DIAGNOSIS — R1084 Generalized abdominal pain: Secondary | ICD-10-CM | POA: Insufficient documentation

## 2023-11-23 DIAGNOSIS — R7989 Other specified abnormal findings of blood chemistry: Secondary | ICD-10-CM | POA: Insufficient documentation

## 2023-11-23 DIAGNOSIS — K50911 Crohn's disease, unspecified, with rectal bleeding: Secondary | ICD-10-CM | POA: Diagnosis not present

## 2023-11-23 DIAGNOSIS — R109 Unspecified abdominal pain: Secondary | ICD-10-CM | POA: Diagnosis not present

## 2023-11-23 DIAGNOSIS — R0981 Nasal congestion: Secondary | ICD-10-CM | POA: Insufficient documentation

## 2023-11-23 DIAGNOSIS — R110 Nausea: Secondary | ICD-10-CM | POA: Insufficient documentation

## 2023-11-23 DIAGNOSIS — K529 Noninfective gastroenteritis and colitis, unspecified: Secondary | ICD-10-CM | POA: Diagnosis not present

## 2023-11-23 DIAGNOSIS — K921 Melena: Secondary | ICD-10-CM | POA: Diagnosis not present

## 2023-11-23 DIAGNOSIS — R197 Diarrhea, unspecified: Secondary | ICD-10-CM | POA: Insufficient documentation

## 2023-11-23 DIAGNOSIS — R748 Abnormal levels of other serum enzymes: Secondary | ICD-10-CM | POA: Diagnosis not present

## 2023-11-23 DIAGNOSIS — K5000 Crohn's disease of small intestine without complications: Secondary | ICD-10-CM

## 2023-11-23 LAB — RESP PANEL BY RT-PCR (RSV, FLU A&B, COVID)  RVPGX2
Influenza A by PCR: NEGATIVE
Influenza B by PCR: NEGATIVE
Resp Syncytial Virus by PCR: NEGATIVE
SARS Coronavirus 2 by RT PCR: NEGATIVE

## 2023-11-23 LAB — CBC
HCT: 35.9 % — ABNORMAL LOW (ref 36.0–46.0)
Hemoglobin: 11.7 g/dL — ABNORMAL LOW (ref 12.0–15.0)
MCH: 29.2 pg (ref 26.0–34.0)
MCHC: 32.6 g/dL (ref 30.0–36.0)
MCV: 89.5 fL (ref 80.0–100.0)
Platelets: 288 10*3/uL (ref 150–400)
RBC: 4.01 MIL/uL (ref 3.87–5.11)
RDW: 13.2 % (ref 11.5–15.5)
WBC: 9.7 10*3/uL (ref 4.0–10.5)
nRBC: 0 % (ref 0.0–0.2)

## 2023-11-23 LAB — URINALYSIS, ROUTINE W REFLEX MICROSCOPIC
Bilirubin Urine: NEGATIVE
Glucose, UA: NEGATIVE mg/dL
Ketones, ur: NEGATIVE mg/dL
Leukocytes,Ua: NEGATIVE
Nitrite: NEGATIVE
Protein, ur: NEGATIVE mg/dL
Specific Gravity, Urine: 1.024 (ref 1.005–1.030)
pH: 5 (ref 5.0–8.0)

## 2023-11-23 LAB — COMPREHENSIVE METABOLIC PANEL
ALT: 183 U/L — ABNORMAL HIGH (ref 0–44)
AST: 129 U/L — ABNORMAL HIGH (ref 15–41)
Albumin: 3.7 g/dL (ref 3.5–5.0)
Alkaline Phosphatase: 197 U/L — ABNORMAL HIGH (ref 38–126)
Anion gap: 11 (ref 5–15)
BUN: 13 mg/dL (ref 8–23)
CO2: 23 mmol/L (ref 22–32)
Calcium: 9.5 mg/dL (ref 8.9–10.3)
Chloride: 104 mmol/L (ref 98–111)
Creatinine, Ser: 0.77 mg/dL (ref 0.44–1.00)
GFR, Estimated: >60 mL/min (ref >60)
Glucose, Bld: 107 mg/dL — ABNORMAL HIGH (ref 70–99)
Potassium: 3.3 mmol/L — ABNORMAL LOW (ref 3.5–5.1)
Sodium: 138 mmol/L (ref 135–145)
Total Bilirubin: 0.8 mg/dL (ref 0.0–1.2)
Total Protein: 7.8 g/dL (ref 6.5–8.1)

## 2023-11-23 LAB — CBC WITH DIFFERENTIAL/PLATELET
Basophils Absolute: 0 10*3/uL (ref 0.0–0.2)
Basos: 0 %
EOS (ABSOLUTE): 0.2 10*3/uL (ref 0.0–0.4)
Eos: 3 %
Hematocrit: 32.6 % — ABNORMAL LOW (ref 34.0–46.6)
Hemoglobin: 10.8 g/dL — ABNORMAL LOW (ref 11.1–15.9)
Immature Grans (Abs): 0 10*3/uL (ref 0.0–0.1)
Immature Granulocytes: 0 %
Lymphocytes Absolute: 1 10*3/uL (ref 0.7–3.1)
Lymphs: 13 %
MCH: 28.8 pg (ref 26.6–33.0)
MCHC: 33.1 g/dL (ref 31.5–35.7)
MCV: 87 fL (ref 79–97)
Monocytes Absolute: 0.5 10*3/uL (ref 0.1–0.9)
Monocytes: 6 %
Neutrophils Absolute: 5.6 10*3/uL (ref 1.4–7.0)
Neutrophils: 78 %
Platelets: 299 10*3/uL (ref 150–450)
RBC: 3.75 x10E6/uL — ABNORMAL LOW (ref 3.77–5.28)
RDW: 12.6 % (ref 11.7–15.4)
WBC: 7.3 10*3/uL (ref 3.4–10.8)

## 2023-11-23 LAB — CALPROTECTIN, FECAL: Calprotectin, Fecal: 961 ug/g — ABNORMAL HIGH (ref 0–120)

## 2023-11-23 LAB — GI PROFILE, STOOL, PCR

## 2023-11-23 LAB — LIPASE, BLOOD: Lipase: 87 U/L — ABNORMAL HIGH (ref 11–51)

## 2023-11-23 MED ORDER — SODIUM CHLORIDE 0.9 % IV BOLUS
1000.0000 mL | Freq: Once | INTRAVENOUS | Status: AC
  Administered 2023-11-23: 1000 mL via INTRAVENOUS

## 2023-11-23 MED ORDER — PREDNISONE 10 MG PO TABS: ORAL_TABLET | ORAL | 0 refills | Status: AC

## 2023-11-23 MED ORDER — PREDNISONE 20 MG PO TABS
40.0000 mg | ORAL_TABLET | Freq: Once | ORAL | Status: AC
  Administered 2023-11-23: 40 mg via ORAL
  Filled 2023-11-23: qty 2

## 2023-11-23 MED ORDER — MORPHINE SULFATE (PF) 4 MG/ML IV SOLN
4.0000 mg | Freq: Once | INTRAVENOUS | Status: AC
  Administered 2023-11-23: 4 mg via INTRAVENOUS
  Filled 2023-11-23: qty 1

## 2023-11-23 MED ORDER — IOHEXOL 300 MG/ML  SOLN
100.0000 mL | Freq: Once | INTRAMUSCULAR | Status: AC | PRN
  Administered 2023-11-23: 100 mL via INTRAVENOUS

## 2023-11-23 MED ORDER — ONDANSETRON HCL 4 MG/2ML IJ SOLN
4.0000 mg | Freq: Once | INTRAMUSCULAR | Status: AC
  Administered 2023-11-23: 4 mg via INTRAVENOUS
  Filled 2023-11-23: qty 2

## 2023-11-23 NOTE — Telephone Encounter (Signed)
-----   Message from [facility] sent at 11/23/2023 12:19 PM EST ----- Good to see that she does not have leukocytosis but her hemoglobin is low.  I still recommend ER visit  RV

## 2023-11-23 NOTE — ED Triage Notes (Signed)
 Pt arrives with c/o diarrhea for about 4 days. Pt has hx of chron's disease. Pt endorses ABD pain and decreased PO intake. Pt sent by GI MD due to having elevated CRP and concerns for infection. Pt denies n/v.

## 2023-11-23 NOTE — ED Provider Notes (Signed)
 [facility] Provider Note    Event Date/Time   First MD Initiated Contact with Patient 11/23/23 1400     (approximate)   History   Diarrhea   HPI Carolyn Campbell is a 68 y.o. female with history of Crohn's disease presenting today for abdominal pain and diarrhea.  Patient states for the past 3 days she has had frequent diarrhea.  She has had generalized abdominal pain but feels it slightly worse on the right side.  She notes mild nausea but no vomiting but has had decreased p.o. intake.  Slight congestion.  Does note 1 episode of stool that appeared black to her but otherwise denies bloody stools.  Otherwise, denies fever, chest pain, shortness of breath, dysuria, hematuria.  No prior abdominal surgeries.  Chart review: Patient has been followed by her GI team outpatient.  She has recent labs showing elevated CRP at 136, elevated calprotectin level.  Otherwise her GI pathogen panel was negative for acute infections.  GI team told her to come to the ED for further evaluation.     Physical Exam   Triage Vital Signs: ED Triage Vitals [11/23/23 1355]  Encounter Vitals Group     BP 132/61     Systolic BP Percentile      Diastolic BP Percentile      Pulse Rate 94     Resp 19     Temp 98 F (36.7 C)     Temp Source Oral     SpO2 96 %     Weight 145 lb (65.8 kg)     Height      Head Circumference      Peak Flow      Pain Score 5     Pain Loc      Pain Education      Exclude from Growth Chart     Most recent vital signs: Vitals:   11/23/23 1355  BP: 132/61  Pulse: 94  Resp: 19  Temp: 98 F (36.7 C)  SpO2: 96%   Physical Exam: I have reviewed the vital signs and nursing notes. General: Awake, alert, no acute distress.  Nontoxic appearing. Head:  Atraumatic, normocephalic.   ENT:  EOM intact, PERRL. Oral mucosa is pink and moist with no lesions. Neck: Neck is supple with full range of motion, No meningeal signs. Cardiovascular:  RRR, No  murmurs. Peripheral pulses palpable and equal bilaterally. Respiratory:  Symmetrical chest wall expansion.  No rhonchi, rales, or wheezes.  Good air movement throughout.  No use of accessory muscles.   Musculoskeletal:  No cyanosis or edema. Moving extremities with full ROM Abdomen:  Soft, generalized tenderness palpation throughout abdomen but worse on the right side, nondistended. Neuro:  GCS 15, moving all four extremities, interacting appropriately. Speech clear. Psych:  Calm, appropriate.   Skin:  Warm, dry, no rash.    ED Results / Procedures / Treatments   Labs (all labs ordered are listed, but only abnormal results are displayed) Labs Reviewed  LIPASE, BLOOD - Abnormal; Notable for the following components:      Result Value   Lipase 87 (*)    All other components within normal limits  COMPREHENSIVE METABOLIC PANEL - Abnormal; Notable for the following components:   Potassium 3.3 (*)    Glucose, Bld 107 (*)    AST 129 (*)    ALT 183 (*)    Alkaline Phosphatase 197 (*)    All other components within normal limits  CBC - Abnormal; Notable for the following components:   Hemoglobin 11.7 (*)    HCT 35.9 (*)    All other components within normal limits  RESP PANEL BY RT-PCR (RSV, FLU A&B, COVID)  [id number]  URINALYSIS, ROUTINE W REFLEX MICROSCOPIC     EKG    RADIOLOGY CT pending at time of signout   PROCEDURES:  Critical Care performed: No  .Ultrasound ED Peripheral IV (Provider)  Date/Time: 11/23/2023 2:59 PM  Performed by: Janith Lima, MD Authorized by: Janith Lima, MD   Procedure details:    Indications: multiple failed IV attempts and poor IV access     Skin Prep: chlorhexidine gluconate     Location:  Right AC   Angiocath:  20 G   Bedside Ultrasound Guided: Yes     Images: not archived     Patient tolerated procedure without complications: Yes     Dressing applied: Yes      MEDICATIONS ORDERED IN ED: Medications  sodium chloride 0.9 % bolus  1,000 mL (has no administration in time range)  ondansetron (ZOFRAN) injection 4 mg (has no administration in time range)  morphine (PF) 4 MG/ML injection 4 mg (has no administration in time range)     IMPRESSION / MDM / ASSESSMENT AND PLAN / ED COURSE  I reviewed the triage vital signs and the nursing notes.                              Differential diagnosis includes, but is not limited to, Crohn's flare, diverticulitis, enteritis, colitis, pancreatitis, cholecystitis, appendicitis, viral gastroenteritis  Patient's presentation is most consistent with acute complicated illness / injury requiring diagnostic workup.  Patient is a 68 year old female presenting today for 3 days of diarrhea associated with generalized abdominal pain.  She has already had negative outpatient stool pathogen panel.  GI team sent her in for further evaluation given elevated CRP in the setting of her Crohn's disease.  Physical exam notable for tenderness palpation along the right side of the abdomen but also present throughout elsewise just milder.  Vital signs are stable.  Will give 1 L of fluids, Zofran, and morphine for symptomatic management.  CT abdomen/pelvis was ordered for further evaluation to rule out intra-abdominal infection.  Laboratory workup with normal CBC.  Lipase mildly elevated as well as new elevations in LFTs today.  Patient signed out pending results of CT imaging for further disposition.  The patient is on the cardiac monitor to evaluate for evidence of arrhythmia and/or significant heart rate changes. Clinical Course as of 11/23/23 1459  Wed Nov 23, 2023  1423 Comprehensive metabolic panel(!) Newly elevated LFTs along with lipase elevation. [DW]    Clinical Course User Index [DW] Janith Lima, MD     FINAL CLINICAL IMPRESSION(S) / ED DIAGNOSES   Final diagnoses:  Diarrhea, unspecified type  Abdominal pain, generalized     Rx / DC Orders   ED Discharge Orders     None         Note:  This document was prepared using Dragon voice recognition software and may include unintentional dictation errors.   Janith Lima, MD 11/23/23 1500

## 2023-11-23 NOTE — Telephone Encounter (Signed)
 Called patient and talk to patient husband he will take patient now

## 2023-11-23 NOTE — Telephone Encounter (Signed)
 Carolyn Campbell  Please tell her to go to ER right away. I'm concerned if she has any infection  RV

## 2023-11-23 NOTE — ED Provider Notes (Signed)
 Emergency department handoff note  Care of this patient was signed out to me at the end of the previous provider shift.  All pertinent patient information was conveyed and all questions were answered.  Patient pending results of CT abdomen/pelvis showing mild colitis.  As patient has negative stool PCR and no leukocytosis, will start prednisone taper as has been prescribed by Dr. Allegra Lai in the past.  The patient has been reexamined and is ready to be discharged.  All diagnostic results have been reviewed and discussed with the patient/family.  Care plan has been outlined and the patient/family understands all current diagnoses, results, and treatment plans.  There are no new complaints, changes, or physical findings at this time.  All questions have been addressed and answered.  Patient was instructed to, and agrees to follow-up with their primary care physician as well as return to the emergency department if any new or worsening symptoms develop.   Carolyn Katos, MD 11/23/23 [phone]

## 2023-11-23 NOTE — Telephone Encounter (Signed)
 Called and talk to patient husband and patient husband is going to take her to the ER

## 2023-11-23 NOTE — Telephone Encounter (Signed)
 Per pt spouse  phone call pt is very weak and everything she eats comes right out.  Her stomach and bottom hurts bad! Pet spouse pt is getting worse by the day. [phone]

## 2023-11-24 ENCOUNTER — Telehealth: Payer: Self-pay

## 2023-11-24 NOTE — Telephone Encounter (Signed)
 Patient verbalized understanding of instructions. She made follow up appointment with Dr. Allegra Lai.

## 2023-11-24 NOTE — Telephone Encounter (Signed)
-----   Message from [employer] sent at 11/24/2023  8:35 AM EST ----- Please inform patient that I reviewed her ER visit, imaging and labs.  Stool studies negative for infection and CT scan showed colitis and I see that she is on prednisone.  I would like to see her within the next 2 weeks to discuss next steps.  She should let us know if prednisone is helping or not  RV

## 2023-11-27 ENCOUNTER — Encounter: Payer: Self-pay | Admitting: Gastroenterology

## 2023-11-28 LAB — C DIFFICILE, CYTOTOXIN B

## 2023-11-28 LAB — C DIFFICILE TOXINS A+B W/RFLX: C difficile Toxins A+B, EIA: NEGATIVE

## 2023-11-28 MED ORDER — ONDANSETRON HCL 4 MG PO TABS: 4.0000 mg | ORAL_TABLET | Freq: Three times a day (TID) | ORAL | 0 refills | Status: DC | PRN

## 2023-11-28 MED ORDER — PANTOPRAZOLE SODIUM 40 MG PO TBEC: 40.0000 mg | DELAYED_RELEASE_TABLET | Freq: Two times a day (BID) | ORAL | 0 refills | Status: DC

## 2023-11-29 ENCOUNTER — Telehealth: Payer: Self-pay | Admitting: Internal Medicine

## 2023-11-29 NOTE — Telephone Encounter (Signed)
 Patient called to reschedule 3/13 appointment due to Chrons flare up. She asked to reschedule to next month.   Appointment rescheduled as requested

## 2023-12-05 ENCOUNTER — Other Ambulatory Visit: Payer: Medicare Other

## 2023-12-05 ENCOUNTER — Ambulatory Visit: Payer: Medicare Other | Admitting: Internal Medicine

## 2023-12-06 ENCOUNTER — Telehealth: Payer: Self-pay

## 2023-12-06 NOTE — Telephone Encounter (Signed)
 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 [name] DOB [birth year] needs a prior authorization completed for her Entyvio infusions that are done at the infusion clinic.  Her last infusion on 08/30/2023 was denied by [employer] for no authorization.  Thank you!  Submitted PA and:    Your Authorization Request Has Been Approved  Your authorization request number is [id number]. If you need to add a new specialty drug covered under the medical benefit, you will need to submit a new authorization request.  Authorization Status Approved Authorization Number [id number] Authorization Start Date 12-06-2023 Authorization End Date 12-05-2024 [facility] Drug Name Drug Code Authorization Status Injection, vedolizumab, IV, 1 mg J3380 Approved Authorization Request Member InformationMember Information Full Name      Carolyn Campbell Gender      Female Date of Birth      08/30/1956 Subscriber ID      [id number] Group ID      [id number] Relationship      Subscriber/Recipient Requesting ProviderRequesting Provider Provider DetailsProvider Details Provider First Name      [facility] Provider Last Name      [facility] Provider [place]      [id number] Provider NPI      [id number] Provider Address      [address], [place] [address] Provider Phone Number      [phone] Provider Fax Number      [phone] Provider Email      Provider Cell Phone      [employer] of [employer] of Contact Full Name      [place] Phone Number      [phone] Fax Number      [phone] Email      Servicing ProviderServicing Provider/Pharmacy Provider/Pharmacy Details Facility Name      [facility] [place]      [id number] Network Status      In Network Facility Address      [address] [place], [place] [address] Request DetailsRequest Details Patient DetailsPatient  Details Height of the Patient      60 in Weight of the Patient      67.1 Kg Patient Contact Number      [phone]   Service DetailsService Details Initial Diagnosis Date      12-2013 Authorization Start Date      12-06-2023 Clinical DetailsClinical Details New to Therapy or Continuation Therapy      Continuation Therapy Disease State      Moderately to severely active Crohn's disease (CD) Primary ICD-10 Code      K50.10 - Crohn's disease of large intestine without complications Additional ICD-10 Code      Place of Service      Outpatient Facility Drug Class      Inflammatory Agents Drug Description      Entyvio IV (INJ VEDOLIZUMAB IV 1 MG)  Clinical DetailsClinical Status Does medical record documentation demonstrate one of the following?  The member has experienced a positive clinical response to [facility] Per the Health Plan's clinical policy for the requested drug and disease state, this authorization may only be approved for up to 12 months, do you want to proceed?  Yes, I do want to proceed Specialty Pharmacy Drug InformationSpecialty Pharmacy Drug Information Medical Benefit Drug Name (including packaging options) Number of Doses per administration Drug Dosage Frequency Total # Cycles [id number] INJ VEDOLIZUMAB IV 1 MG 1 300 mg Every 8 week(s) 7  Clinical Documentation      Is it an Urgent Request?      No The authorization provided is not a guarantee of payment to the provider. Payment is based on the patient's benefit plan and eligibility when the services are received.

## 2023-12-07 ENCOUNTER — Telehealth: Payer: Self-pay

## 2023-12-07 ENCOUNTER — Other Ambulatory Visit: Payer: Self-pay

## 2023-12-07 ENCOUNTER — Encounter: Payer: Self-pay | Admitting: Gastroenterology

## 2023-12-07 ENCOUNTER — Ambulatory Visit (INDEPENDENT_AMBULATORY_CARE_PROVIDER_SITE_OTHER): Admitting: Gastroenterology

## 2023-12-07 VITALS — BP 112/59 | HR 67 | Temp 98.1°F | Ht 60.0 in | Wt 141.2 lb

## 2023-12-07 DIAGNOSIS — K5010 Crohn's disease of large intestine without complications: Secondary | ICD-10-CM | POA: Diagnosis not present

## 2023-12-07 DIAGNOSIS — K219 Gastro-esophageal reflux disease without esophagitis: Secondary | ICD-10-CM

## 2023-12-07 DIAGNOSIS — K50918 Crohn's disease, unspecified, with other complication: Secondary | ICD-10-CM

## 2023-12-07 MED ORDER — SUTAB 1479-225-188 MG PO TABS: 12.0000 | ORAL_TABLET | Freq: Once | ORAL | 0 refills | Status: AC

## 2023-12-07 NOTE — Progress Notes (Signed)
 Arlyss Repress, MD [address]  [address]  [place], [place] [address]  Main: [phone]  Fax: [phone]    Gastroenterology Consultation  Referring Provider:     Danelle Berry, PA-C Primary Care Physician:  Danelle Berry, PA-C Primary Gastroenterologist:  Dr. Arlyss Repress Reason for Consultation: Crohn's colitis        HPI:   Carolyn Campbell is a 68 y.o. African-American female referred by Danelle Berry, PA-C  for consultation & management of Crohn's disease.  Patient has not seen me since May 2022.  She self discontinued Entyvio sometime late 2022 because she was she was doing so well. Since December 2023, she has been experiencing urgency, loss of appetite, rectal bleeding and loose stools secondary to exacerbation of Crohn's colitis.  Stool studies were negative for infection and her fecal calprotectin levels were significantly elevated to 1140.  Subsequently, underwent colonoscopy in 09/2022 which revealed mild chronic inactive colitis extending from sigmoid to cecum, but moderate to severe proctitis.  [name] has been restarted and she has done remarkably well, received short course of prednisone.  Patient is currently in clinical remission.  She denies any rectal urgency, rectal bleeding.  Weight has been stable.  Appetite is good.  She reports having a hard time getting an IV access with every infusion.  Most recent labs from 05/2023 were normal  Follow-up visit 12/07/2023 Carolyn Campbell is here for follow-up as an urgent visit given recent exacerbation of Crohn's colitis.  She originally called our office on 11/21/2023 with symptoms of lower abdominal pain associated with 3-4 times of diarrhea daily and nausea.  Her symptoms started after she had pasta salad.  Upon investigation, her stool studies were negative for infection including C. difficile, CRP was significantly elevated at 136 mg/L, significantly elevated fecal calprotectin levels 961. She went to the ER on 3/5, negative UA,  mildly elevated serum lipase, as well as her LFTs including AST, ALT and alkaline phosphatase were elevated to 129, 183, 197, normal T. bili, CBC revealed mild normocytic anemia, underwent CT abdomen pelvis with contrast which revealed mild wall thickening and mucosal hyperenhancement seen throughout the ascending and transverse colon consistent with colitis.  She was discharged home on prednisone for flareup of Crohn's disease. Today, patient reports that her diarrhea has resolved, denies any abdominal pain, nausea, rectal bleeding, however she does not feel good, reports ongoing fatigue, feels less cognizant and coherent.  Able to sleep good.  Her appetite is okay. She is accompanied by her daughter today Patient also admits to not missing Entyvio infusion as scheduled.  She notices return of symptoms right before the infusion.  She is due for the infusion in 2 weeks  Crohn's disease classification:  Age: > 40 Location: ileocolonic  Behavior: non stricturing, non penetrating  Perianal: no  IBD diagnosis: 11/2013  Disease course:Crohn's disease in 2015, initial symptoms were abdominal pain, blood in stool and on wiping, bloating. She had a colonoscopy, VCE, EGD at the time of diagnosis. She was initially on mesalamine 4 pills daily, prednisone later switched to Remicade in 11/2014. She took only 5 doses as she could not afford. Then she was switched to Imuran 50 mg daily. She had mild flareups about twice a year. Colonoscopy 05/2017 revealed mild ileocolonic disease primarily on histology. She self discontinued Imuran. Lialda started in 06/2017.  Possible exacerbation of Crohn's disease with elevated fecal calprotectin in 10/2018.  CT abdomen and pelvis unremarkable.  Discontinued Lialda, started on  azathioprine 50 mg daily.  Flareup of Crohn's in 10/2018, confirmed on colonoscopy and elevated fecal calprotectin levels.  Short prednisone course and started Entyvio on 05/23/2019 Discontinued azathioprine in  09/2019.  Normal fecal calprotectin levels, normal CRP in 09/2019.  Has been in clinical and histologic remission.  Patient self discontinued Entyvio in 06/2021 because she felt she has been doing well and did not have to take it long-term.  Resulted in exacerbation of Crohn's colitis, elevated fecal calprotectin levels >1000, colonoscopy in January 2024 revealed mild chronic active colitis in the colon, moderate to severe proctitis.  [name] has been reinitiated in January 2024  Extra intestinal manifestations: None  IBD surgical history: None No family history of IBD  Imaging:  MRE none CTE none SBFT none  Procedures: Colonoscopy 09/30/2022 DIAGNOSIS:  A. COLON, RIGHT; COLD BIOPSY:  - MILD CHRONIC INACTIVE COLITIS CONSISTENT WITH PATIENT'S KNOWN HISTORY  OF CROHN'S DISEASE.  - NEGATIVE FOR GRANULOMA, DYSPLASIA AND MALIGNANCY.   B.  COLON, LEFT; COLD BIOPSY:  - MILD CHRONIC INACTIVE COLITIS, CONSISTENT WITH PATIENT'S KNOWN HISTORY  OF CROHN'S DISEASE.  - NEGATIVE FOR GRANULOMA, DYSPLASIA OR MALIGNANCY.   C RECTUM; COLD BIOPSY:  - CHRONIC, MODERATE TO MARKEDLY ACTIVE COLITIS WITH ULCERATION AND  CRYPTITIS, CONSISTENT WITH PATIENT'S KNOWN HISTORY OF CROHN'S DISEASE.  - NEGATIVE FOR GRANULOMA, DYSPLASIA AND MALIGNANCY.   Colonoscopy 07/16/2020 - The examined portion of the ileum was normal. - The entire examined colon is normal. Biopsied. - The distal rectum and anal verge are normal on retroflexion view. - Simple Endoscopic Score for Crohn's Disease: 0, mucosal inflammatory changes secondary to quiescent Crohn's disease. DIAGNOSIS:  A. COLON POLYP, CECUM; COLD SNARE:  - HYPERPLASTIC POLYP.  - NEGATIVE FOR DYSPLASIA AND MALIGNANCY.   B.  COLON, RIGHT; COLD BIOPSY:  - FOCAL MILD CHRONIC INACTIVE COLITIS CONSISTENT WITH PATIENT'S KNOWN  HISTORY OF CROHN'S DISEASE.  - NEGATIVE FOR DYSPLASIA AND MALIGNANCY.   C.  COLON, LEFT; COLD BIOPSY:  - MILD CHRONIC INACTIVE COLITIS  CONSISTENT WITH PATIENT'S KNOWN HISTORY  OF CROHN'S DISEASE.  - NEGATIVE FOR DYSPLASIA AND MALIGNANCY.   D.  RECTUM; COLD BIOPSY:  - UNREMARKABLE COLONIC TYPE MUCOSA.  - NEGATIVE FOR DYSPLASIA AND MALIGNANCY.   Colonoscopy : 01/28/2005, showed external hemorrhoids only Colonoscopy 12/10/2013, showed normal terminal ileum, biopsies performed. Skipped areas of nonbleeding ulcerative mucosa were seen in the entire colon, biopsies performed. 4 mm polyp in the sigmoid colon Pathology: Rectal biopsy mild-to-moderate active proctitis with architectural features of chronicity, negative for dysplasia and malignancy. Terminal ileum: Small bowel mucosa with preserved villous architecture. Right colon biopsy: No significant pathologic changes  Upper Endoscopy 12/10/2013, underwent dilation of the esophagus Ampullary biopsy: Normal, gastric biopsy: Intramucosal with erosion and mild chronic gastritis, negative for H. pylori, dysplasia and malignancy  VCE 05/06/2014: Erosions in the distal ileum  EGD and colonoscopy 03/08/2019  DIAGNOSIS:  A.  DUODENUM POLYP; COLD BIOPSY:  - DUODENAL MUCOSA WITH PROMINENT BRUNNER'S GLAND AND REACTIVE CHANGES IN  OVERLYING VILLI.  - DEEPER SECTIONS EXAMINED.  - NEGATIVE FOR ACTIVE ENTERITIS, DYSPLASIA, AND MALIGNANCY.   B.  STOMACH, RANDOM; COLD BIOPSY:  - ANTRAL MUCOSA WITH CHANGES CONSISTENT WITH ENDOSCOPIC FINDING OF  ANTRAL EROSION.  - FRAGMENTS OF UNREMARKABLE OXYNTIC MUCOSA.  - NEGATIVE FOR H. PYLORI, DYSPLASIA, AND MALIGNANCY.   C.  TERMINAL ILEUM; COLD BIOPSY:  - UNREMARKABLE SMALL INTESTINAL MUCOSA.  - NEGATIVE FOR ACTIVE ENTERITIS, DYSPLASIA, AND MALIGNANCY.   D.  COLON, RANDOM RIGHT; COLD  BIOPSY:  - MODERATE CHRONIC ACTIVE COLITIS CONSISTENT WITH PATIENT'S KNOWN  HISTORY OF CROHN'S.  - NEGATIVE FOR VIRAL CYTOPATHIC EFFECT, DYSPLASIA, AND MALIGNANCY.   E.  COLON, RANDOM TRANSVERSE; COLD BIOPSY:  - MILD CHRONIC ACTIVE COLITIS INVOLVING A MINORITY  OF THE BIOPSY  FRAGMENTS.  - CHRONIC INACTIVE COLITIS INVOLVING THE MAJORITY OF THE BIOPSY  FRAGMENTS.  - NEGATIVE FOR VIRAL CYTOPATHIC EFFECT, DYSPLASIA, AND MALIGNANCY.   F.  COLON, RANDOM LEFT; COLD BIOPSY:  - PATCHY MILD CHRONIC INACTIVE COLITIS.  - NEGATIVE FOR DYSPLASIA AND MALIGNANCY.   G.  RECTUM, RANDOM PROCTITIS; COLD BIOPSY:  - MARKED CHRONIC ACTIVE PROCTITIS CONSISTENT WITH PATIENT'S KNOWN  HISTORY OF CROHN'S.  - NEGATIVE FOR VIRAL CYTOPATHIC EFFECT, DYSPLASIA, AND MALIGNANCY.  EGD 06/08/2017 Normal esophagus, small hiatal hernia, normal stomach and duodenum  Colonoscopy 06/08/2017 The perianal and digital rectal examinations were normal. Pertinent negatives include normal sphincter tone and no palpable rectal Lesions. The terminal ileum appeared normal. Biopsies were taken with a cold forceps for histology. Two scattered non-bleeding aphthae were found in the ascending colon. Rest of the colon and rectum appeared normal. Random biopsies performed   IBD medications:  Steroids: Prednisone steroid responsive, budesonide 5-ASA: mesalamine, Lialda in the past Immunomodulators: azathioprine, discontinued in 2020 TPMT status normal Biologics:  Anti TNFs: Remicade monotherapy, received induction followed by 1 maintenance dose in 11/2014 Anti Integrins: Entyvio started on 05/23/2019 Ustekinumab: Tofactinib: Clinical trial:   Past Medical History:  Diagnosis Date   Abdominal pain, epigastric    Acute metabolic encephalopathy 07/22/2022   Allergic rhinitis    Arthritis    Asthma    Breast cancer (HCC) 2006   LT LUMPECTOMY   Cataract    Clotting disorder (HCC)    Cognitive deficits as late effect of cerebrovascular disease    Convulsions, unspecified convulsion type (HCC) 09/24/2022   COPD (chronic obstructive pulmonary disease) (HCC)    COVID-19 virus infection 04/2021   Crohn's disease (HCC) 05/21/2015   FOLLOWED BY GI   Crohn's disease of both small and large  intestine with rectal bleeding (HCC) 12/04/2014   Depression    Currently taking zoloft.   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 05/2021 Echo: EF 60-65%, no rwma, nl RV fxn.   History of kidney stones    Hypercholesterolemia 01/18/2007   Hyperlipidemia    Hypertension    Hypothyroidism    Low back pain radiating to left leg 06/17/2021   Nonobstructive CAD (coronary artery disease)    a. 10/2012 Cath: diffuse minor irregs-->med rx; b. 08/2021 Cor CTA: Ca2+ = 55.6 (77th%'ile), LAD 25p, LCX <25p. No signif non-cardiac findings.   Occlusion, cerebral artery    NOS w/infarction   Osteoporosis    Overweight (BMI 25.0-29.9) 07/17/2022   Personal history of radiation therapy 2006   BREAST CA   Pulmonary embolism (HCC)    a.04/2021 CTA Chest: Small filling defects are noted in upper and lower lobe branches of L PA-->acute PE-->eliquis. *PE occurred in context of COVID infxn.   Stroke [facility])    Ulcer     Past Surgical History:  Procedure Laterality Date   BREAST BIOPSY Left 2006   POS   BREAST LUMPECTOMY Left 09/20/2004  positive   BREAST SURGERY Left    malignant biopsy   CARDIAC CATHETERIZATION     COLONOSCOPY  2015   COLONOSCOPY WITH PROPOFOL N/A 06/08/2017   Procedure: COLONOSCOPY WITH PROPOFOL;  Surgeon: Toney Reil, MD;  Location: [facility];  Service: Gastroenterology;  Laterality: N/A;   COLONOSCOPY WITH PROPOFOL N/A 03/08/2019   Procedure: COLONOSCOPY WITH PROPOFOL;  Surgeon: Toney Reil, MD;  Location: [facility] ENDOSCOPY;  Service: Gastroenterology;  Laterality: N/A;   COLONOSCOPY WITH PROPOFOL N/A 07/16/2020   Procedure: COLONOSCOPY WITH PROPOFOL;  Surgeon: Toney Reil, MD;  Location: [facility]  ENDOSCOPY;  Service: Gastroenterology;  Laterality: N/A;   COLONOSCOPY WITH PROPOFOL N/A 09/30/2022   Procedure: COLONOSCOPY WITH PROPOFOL;  Surgeon: Toney Reil, MD;  Location: [facility] ENDOSCOPY;  Service: Gastroenterology;  Laterality: N/A;   ESOPHAGOGASTRODUODENOSCOPY  2015   ESOPHAGOGASTRODUODENOSCOPY (EGD) WITH PROPOFOL N/A 06/08/2017   Procedure: ESOPHAGOGASTRODUODENOSCOPY (EGD) WITH PROPOFOL;  Surgeon: Toney Reil, MD;  Location: [facility];  Service: Gastroenterology;  Laterality: N/A;   ESOPHAGOGASTRODUODENOSCOPY (EGD) WITH PROPOFOL N/A 03/08/2019   Procedure: ESOPHAGOGASTRODUODENOSCOPY (EGD) WITH PROPOFOL;  Surgeon: Toney Reil, MD;  Location: [facility] ENDOSCOPY;  Service: Gastroenterology;  Laterality: N/A;   EYE SURGERY     FINGER SURGERY     Right small finger   FRACTURE SURGERY     GIVENS CAPSULE STUDY  2015   TUBAL LIGATION       Current Outpatient Medications:    albuterol (VENTOLIN HFA) 108 (90 Base) MCG/ACT inhaler, Inhale 2 puffs into the lungs every 6 (six) hours as needed for wheezing or shortness of breath (chest tightness)., Disp: 8 g, Rfl: 2   apixaban (ELIQUIS) 5 MG TABS tablet, Take 1 tablet (5 mg total) by mouth 2 (two) times daily., Disp: 60 tablet, Rfl: 0   brimonidine (ALPHAGAN) 0.2 % ophthalmic solution, Place 1 drop into both eyes in the morning and at bedtime., Disp: , Rfl:    gabapentin (NEURONTIN) 300 MG capsule, 1 po qHS x 4 days, then bid x 4 days, then tid, Disp: , Rfl:    latanoprost (XALATAN) 0.005 % ophthalmic solution, Place 1 drop into both eyes at bedtime. , Disp: , Rfl:    levothyroxine (SYNTHROID) 50 MCG tablet, Take 50 mcg po qam before breakfast 5 d a week, and take 100 mcg (2 tabs) po qam before breakfast 2 d a week, Disp: 110 tablet, Rfl: 1   losartan-hydrochlorothiazide (HYZAAR) 50-12.5 MG tablet, Take 1 tablet by mouth daily., Disp: 90 tablet, Rfl: 1   meloxicam (MOBIC) 15 MG tablet, Take 0.5-1 tablets (7.5-15 mg  total) by mouth daily., Disp: 30 tablet, Rfl: 0   metaxalone (SKELAXIN) 800 MG tablet, Take 0.5 tablets (400 mg total) by mouth 2 (two) times daily as needed for muscle spasms., Disp: 10 tablet, Rfl: 0   Multiple Vitamin (MULTIVITAMIN) tablet, Take 1 tablet by mouth daily., Disp: , Rfl:    ondansetron (ZOFRAN) 4 MG tablet, Take 1 tablet (4 mg total) by mouth every 8 (eight) hours as needed for nausea or vomiting., Disp: 30 tablet, Rfl: 0   pantoprazole (PROTONIX) 40 MG tablet, Take 1 tablet (40 mg total) by mouth 2 (two) times daily before a meal., Disp: 60 tablet, Rfl: 0   predniSONE (DELTASONE) 10 MG tablet, Take 4 tablets (40 mg total) by mouth daily for 14 days, THEN 3 tablets (30 mg total) daily for 7 days, THEN 2 tablets (20 mg total) daily for 7 days, THEN  1 tablet (10 mg total) daily for 7 days., Disp: 98 tablet, Rfl: 0   rosuvastatin (CRESTOR) 40 MG tablet, Take 1 tablet (40 mg total) by mouth daily., Disp: 90 tablet, Rfl: 3   sertraline (ZOLOFT) 50 MG tablet, Take 1 tablet by mouth once daily, Disp: 90 tablet, Rfl: 0   Sodium Sulfate-Mag Sulfate-KCl (SUTAB) [phone] MG TABS, Take 12 tablets by mouth once for 1 dose., Disp: 24 tablet, Rfl: 0   vedolizumab (ENTYVIO) 300 MG injection, Induction Entyvio 300mg  at week 0,2, and 6 and then maintenance does every 8 weeks, Disp: 1 each, Rfl: 12   Family History  Problem Relation Age of Onset   Heart disease Mother    Heart attack Mother    Hypertension Mother    Varicose Veins Mother    Cerebrovascular Accident Father    Arthritis Father    Hypertension Father    Dementia Father    Breast cancer Sister    Cancer Sister    Breast cancer Sister 24   Cancer Sister    Cancer Sister    Hypertension Other    Diabetes Other    Heart attack Other 37   Mental illness Neg Hx      Social History   Tobacco Use   Smoking status: Never   Smokeless tobacco: Never  Vaping Use   Vaping status: Never Used  Substance Use Topics   Alcohol  use: Never   Drug use: Never    Allergies as of 12/07/2023   (No Known Allergies)    Review of Systems:    All systems reviewed and negative except where noted in HPI.   Physical Exam:  BP (!) 112/59 (BP Location: Left Arm, Patient Position: Sitting, Cuff Size: Normal)   Pulse 67   Temp 98.1 F (36.7 C) (Oral)   Ht 5' (1.524 m)   Wt 141 lb 4 oz (64.1 kg)   BMI 27.59 kg/m  No LMP recorded. Patient is postmenopausal.  General:   Alert,  Well-developed, well-nourished, pleasant and cooperative in NAD Head:  Normocephalic and atraumatic. Eyes:  Sclera clear, no icterus.   Conjunctiva pink. Ears:  Normal auditory acuity. Nose:  No deformity, discharge, or lesions. Mouth:  No deformity or lesions,oropharynx pink & moist. Neck:  Supple; no masses or thyromegaly. Lungs:  Respirations even and unlabored.  Clear throughout to auscultation.   No wheezes, crackles, or rhonchi. No acute distress. Heart:  Regular rate and rhythm; no murmurs, clicks, rubs, or gallops. Abdomen:  Normal bowel sounds.  No bruits.  Soft, nontender, and non-distended without masses, hepatosplenomegaly or hernias noted.  No guarding or rebound tenderness.   Rectal: Nor performed Msk:  Symmetrical without gross deformities. Good, equal movement & strength bilaterally. Pulses:  Normal pulses noted. Extremities:  No clubbing or edema.  No cyanosis. Neurologic:  Alert and oriented x3;  grossly normal neurologically. Skin:  Intact without significant lesions or rashes. No jaundice. Psych:  Alert and cooperative. Normal mood and affect.  Imaging Studies: MRI reviewed  Assessment and Plan:   Carolyn Campbell is a 68 y.o. African-American female with Terminal ileal and colonic Crohn's, diagnosed in 2015 who was previously maintained on Imuran 50 mg daily with mild intermittent flareups about twice a year needing prednisone. She does not have any endoscopy evidence of ileocolonic Crohn's disease other than a few  scattered aphthae in the ascending colon on colonoscopy in 05/2017 but histology revealed chronic mild active ileitis and right-sided colitis. MR enterography  did not inflammation in small bowel. I tried to give her budesonide but she could not afford high co-pay of $500 for 30 days supply.  She stopped Imuran by herself in the past.  Recent flareup of her Crohn's disease with worsening of upper abdominal pain associated with nausea, unintentional weight loss, poor p.o. intake. Fecal calprotectin levels are elevated.  Discontinued Lialda, currently on azathioprine 50 mg daily.  She took prednisone for 2 days only due to new onset of rash and itching.  Rash has resolved.  EGD was unremarkable.  Repeat colonoscopy revealed active Crohn's colitis, worse in the rectum with mild symptoms.  Started on Entyvio on 05/23/2019, which has resulted in clinical and histologic remission.  Stopped Entyvio in late 2022, resulted in exacerbation of Crohn's in 08/2022, restarted with reinduction and maintenance in 09/2022  Crohn's disease of large intestine, nonstricturing, nonpenetrating Previously in clinical and histologic remission followed by exacerbation in 08/2022 secondary to self discontinuation of Entyvio Colonoscopy in 09/2022 revealed moderate to severe proctitis [name] has been reinitiated in 09/2022, resulted in clinical remission Fecal calprotectin levels were normal in 12/08/2022 and 07/10/2023 Her insurance denied to switch to injectable form of Entyvio  Recent exacerbation of Crohn's disease on 11/21/2023 Stool studies negative for infection including C. difficile Significantly elevated fecal calprotectin levels at 961 and elevated CRP Mildly elevated LFTs CT abdomen pelvis revealed right-sided colitis Initiated on prednisone which resolved diarrhea, nausea, abdominal pain Recheck CRP, CBC, LFTs Check vedolizumab antibodies and drug levels Discussed about colonoscopy to assess severity of colitis given  recent exacerbation and she is agreeable to undergo   Chronic GERD, well controlled EGD in 2020 was normal Currently not on PPI and asymptomatic  IBD Health Maintenance  1.TB status: Gold quantiferon negative 09/2019 2. Anemia: normal Hb, vitamin B12 and ferritin levels are normal 3.Immunizations: Hep A immune, hepatitis B immune, and annual influenza vaccine, prevnar received in 06/2017, pneumovax administered on 03/27/2019.  Received first dose of Shingrix vaccine on 04/11/2019, and booster on 01/29/2021 4.Cancer screening I) Colon cancer/dysplasia surveillance: Current scope in 09/2022, no evidence of dysplasia and no precancerous polyps identified,  II) Cervical cancer: n/a III) Skin cancer - n/a  5.Bone health Vitamin D status: Normal Bone density testing: On 09/25/2020, revealed osteopenia, repeat DEXA scan in 01/2023 revealed osteopenia Normal vitamin D levels in the past 5. Labs: Every with every other infusion 6. Smoking: Never smoked 7. NSAIDs and Antibiotics use: Denies NSAID use and antibiotic use  Follow up in 3 months  Arlyss Repress, MD

## 2023-12-07 NOTE — Telephone Encounter (Signed)
-----   Message from Michaelyn Barter sent at 12/07/2023  4:07 PM EDT ----- Regarding: RE:  The patient can resume Eliquis immediately after the colonoscopy unless otherwise specified by Gastroenterology.  ----- Message ----- From: Denman George, CMA Sent: 12/07/2023   2:59 PM EDT To: Michaelyn Barter, MD

## 2023-12-07 NOTE — Telephone Encounter (Signed)
-----   Message from Michaelyn Barter sent at 12/07/2023  4:04 PM EDT ----- Regarding: RE:  Please see below the requested clearance form for Eliquis from Dr. Allegra Lai office. She is scheduled for colonoscopy in April.  Could we please complete the form.   Okay, to hold Eliquis 48 hours prior to the procedure.   Thank you ----- Message ----- From: Denman George, CMA Sent: 12/07/2023   2:59 PM EDT To: Michaelyn Barter, MD

## 2023-12-08 ENCOUNTER — Telehealth: Payer: Self-pay

## 2023-12-08 ENCOUNTER — Encounter: Payer: Self-pay | Admitting: Gastroenterology

## 2023-12-08 NOTE — Telephone Encounter (Signed)
 Patient verbalized understanding of instructions

## 2023-12-08 NOTE — Telephone Encounter (Signed)
-----   Message from [facility] sent at 12/08/2023  1:15 PM EDT ----- All the labs are looking normal including liver enzymes, CRP which is an inflammatory marker and her blood counts.  Waiting on vedolizumab antibodies and levels. Continue prednisone quick taper as discussed to taper down by every 5 days and receive Entyvio as scheduled, colonoscopy as scheduled  RV

## 2023-12-08 NOTE — Progress Notes (Signed)
 Morrie Sheldon, -I just faxed the form back to you.

## 2023-12-08 NOTE — Telephone Encounter (Signed)
 Patient verbalized understanding of results

## 2023-12-12 ENCOUNTER — Encounter: Payer: Self-pay | Admitting: Family Medicine

## 2023-12-12 ENCOUNTER — Other Ambulatory Visit: Payer: Self-pay | Admitting: Internal Medicine

## 2023-12-12 ENCOUNTER — Other Ambulatory Visit: Payer: Self-pay | Admitting: Family Medicine

## 2023-12-12 DIAGNOSIS — E039 Hypothyroidism, unspecified: Secondary | ICD-10-CM

## 2023-12-14 ENCOUNTER — Encounter: Payer: Self-pay | Admitting: Physical Therapy

## 2023-12-14 ENCOUNTER — Ambulatory Visit: Payer: Medicare Other | Attending: Family Medicine | Admitting: Physical Therapy

## 2023-12-14 VITALS — BP 132/56 | HR 58

## 2023-12-14 DIAGNOSIS — M542 Cervicalgia: Secondary | ICD-10-CM | POA: Diagnosis not present

## 2023-12-14 NOTE — Therapy (Signed)
 OUTPATIENT PHYSICAL THERAPY TREATMENT / PROGRESS NOTE Dates of reporting from 11/16/2023 to 12/14/2023   Patient Name: Carolyn Campbell MRN: [id number] DOB:Jul 18, 1956, 68 y.o., female Today's Date: 12/14/2023  END OF SESSION:  PT End of Session - 12/14/23 1747     Visit Number 2    Number of Visits 17    Date for PT Re-Evaluation 02/08/24    Authorization Type [employer] - reporting period from 11/16/23    Authorization Time Period [id number] 2/26-4/23 for 16 Pt visits    Authorization - Visit Number 2    Authorization - Number of Visits 16    Progress Note Due on Visit 10    PT Start Time 1741    PT Stop Time 1822    PT Time Calculation (min) 41 min    Activity Tolerance Patient tolerated treatment well;Patient limited by pain    Behavior During Therapy [facility] for tasks assessed/performed              Past Medical History:  Diagnosis Date   Abdominal pain, epigastric    Acute metabolic encephalopathy 07/22/2022   Allergic rhinitis    Arthritis    Asthma    Breast cancer (HCC) 2006   LT LUMPECTOMY   Cataract    Clotting disorder (HCC)    Cognitive deficits as late effect of cerebrovascular disease    Convulsions, unspecified convulsion type (HCC) 09/24/2022   COPD (chronic obstructive pulmonary disease) (HCC)    COVID-19 virus infection 04/2021   Crohn's disease (HCC) 05/21/2015   FOLLOWED BY GI   Crohn's disease of both small and large intestine with rectal bleeding (HCC) 12/04/2014   Depression    Currently taking zoloft.   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 05/2021 Echo: EF 60-65%, no rwma, nl RV fxn.   History of kidney stones    Hypercholesterolemia 01/18/2007   Hyperlipidemia    Hypertension     Hypothyroidism    Low back pain radiating to left leg 06/17/2021   Nonobstructive CAD (coronary artery disease)    a. 10/2012 Cath: diffuse minor irregs-->med rx; b. 08/2021 Cor CTA: Ca2+ = 55.6 (77th%'ile), LAD 25p, LCX <25p. No signif non-cardiac findings.   Occlusion, cerebral artery    NOS w/infarction   Osteoporosis    Overweight (BMI 25.0-29.9) 07/17/2022   Personal history of radiation therapy 2006   BREAST CA   Pulmonary embolism (HCC)    a.04/2021 CTA Chest: Small filling defects are noted in upper and lower lobe branches of L PA-->acute PE-->eliquis. *PE occurred in context of COVID infxn.   Stroke [facility])    Ulcer    Past Surgical History:  Procedure Laterality Date   BREAST BIOPSY Left 2006   POS   BREAST LUMPECTOMY Left 09/20/2004   positive   BREAST SURGERY Left    malignant biopsy   CARDIAC CATHETERIZATION     COLONOSCOPY  2015   COLONOSCOPY WITH PROPOFOL N/A 06/08/2017   Procedure: COLONOSCOPY WITH PROPOFOL;  Surgeon: Toney Reil, MD;  Location: [facility];  Service: Gastroenterology;  Laterality: N/A;   COLONOSCOPY WITH PROPOFOL N/A 03/08/2019   Procedure: COLONOSCOPY WITH PROPOFOL;  Surgeon: Toney Reil, MD;  Location: [facility];  Service: Gastroenterology;  Laterality: N/A;   COLONOSCOPY WITH PROPOFOL N/A 07/16/2020   Procedure: COLONOSCOPY WITH PROPOFOL;  Surgeon: Toney Reil, MD;  Location: [facility];  Service: Gastroenterology;  Laterality: N/A;   COLONOSCOPY WITH PROPOFOL N/A 09/30/2022   Procedure: COLONOSCOPY WITH PROPOFOL;  Surgeon: Toney Reil, MD;  Location: [facility];  Service: Gastroenterology;  Laterality: N/A;   ESOPHAGOGASTRODUODENOSCOPY  2015   ESOPHAGOGASTRODUODENOSCOPY (EGD) WITH PROPOFOL N/A 06/08/2017   Procedure: ESOPHAGOGASTRODUODENOSCOPY (EGD) WITH PROPOFOL;  Surgeon: Toney Reil, MD;  Location: [facility];  Service: Gastroenterology;  Laterality: N/A;    ESOPHAGOGASTRODUODENOSCOPY (EGD) WITH PROPOFOL N/A 03/08/2019   Procedure: ESOPHAGOGASTRODUODENOSCOPY (EGD) WITH PROPOFOL;  Surgeon: Toney Reil, MD;  Location: [facility];  Service: Gastroenterology;  Laterality: N/A;   EYE SURGERY     FINGER SURGERY     Right small finger   FRACTURE SURGERY     GIVENS CAPSULE STUDY  2015   TUBAL LIGATION     Patient Active Problem List   Diagnosis Date Noted   REM behavioral disorder 03/14/2023   Excessive daytime sleepiness 03/14/2023   Crohn's disease of large intestine without complication (HCC) 09/30/2022   Convulsions, unspecified convulsion type (HCC) 09/24/2022   Encounter for current long-term use of anticoagulants 09/24/2022   MDD (major depressive disorder), recurrent episode, mild (HCC) 08/18/2022   Restless leg syndrome 08/18/2022   Pulmonary embolism (HCC) 07/16/2022   Pain in limb 11/10/2021   CAD (coronary artery disease) 08/06/2021   Glaucoma 02/23/2021   Other long term (current) drug therapy 01/22/2021   Varicose veins of both lower extremities with pain 07/19/2018   Arthritis of right hand 12/21/2017   Major depression in remission (HCC) 12/21/2017   Fibrocystic breast changes 06/22/2017   Osteopenia 10/18/2016   Atherosclerosis of aorta (HCC) 12/19/2015   Vitreous degeneration 05/21/2015   Glaucoma associated with chamber angle anomalies 05/21/2015   H/O malignant neoplasm of breast 05/21/2015   Solitary pulmonary nodule 11/07/2012   Cognitive deficits as late effect of cerebrovascular disease 01/20/2010   CVA, old, hemiparesis (HCC) 04/28/2009   Acquired hypothyroidism 06/12/2008   HLD (hyperlipidemia) 01/18/2007   Primary hypertension 01/18/2007    PCP: Danelle Berry, PA-C  REFERRING PROVIDER: Danelle Berry, PA-C  REFERRING DIAG: Neck pain on right side  Rationale for Evaluation and Treatment: Rehabilitation  THERAPY DIAG:  Cervicalgia  ONSET DATE: Over a month ago, insidious onset neck  pain  SUBJECTIVE:  PERTINENT HISTORY:  Patient is a 68 y.o. female who presents to outpatient physical therapy with a referral for medical diagnosis of neck pain on right side. This patient's chief complaints consist of insidious onset acute neck pain on the right side of her neck from the base of her skull down the the top of her shoulder, leading to the following functional deficits: difficulty and pain with tasks moving her head in all directions (particularly turning), driving, exercising, and sewing. Relevant past medical history and comorbidities include asthma, hx of breast cancer, clotting disorder (HCC), COPD, depression, glaucoma, convulsions (unspecified convulsion type), osteoporosis, nonobstructive coronary artery disease, hx of pulmonary embolism (HCC).     SUBJECTIVE STATEMENT: Patient states she is sort of doing okay. She states her neck is doing a lot better and she has been doing her HEP. She states her Crohn's flared up and she was put on prednisone and she has another week of that, which makes her feel weird. She states her neck pain improved before that. She states she has not been going to the gym because she feels high on the prednisone and doesn't feel comfortable driving. Her husband brought her to this appointment.    PAIN:  Are you having pain? Yes NPRS: Current: 0/10 right side of neck described as tension   PRECAUTIONS: None  RED FLAGS: Patient denies hx of seizures, heart problems, diabetes, unexplained weight loss, unexplained changes in bowel or bladder problems, unexplained stumbling or dropping things, and spinal surgery    Patient has hx of breast cancer, blood clots in lungs, and osteoporosis.   PATIENT GOALS: Wants her neck to be better, wants to be able to "do what she wants  to do"    OBJECTIVE:   Vitals:   12/14/23 1748  BP: (!) 132/56  Pulse: (!) 58  SpO2: 97%  Automatic cuff  SELF-REPORTED FUNCTION Patient Specific Functional Scale (PSFS)  Driving: 10 Exercising: 10 Sewing: 10 Average: 10/10  SPINE MOTION  CERVICAL SPINE AROM *Indicates pain Flexion: 65 Extension: 55 Side Flexion:   R 40   L 25* pulling on the right side Rotation:  R 65 L 45 feels like something is stopping her Protrusion: 3.5 cm Retraction: 2.5 cm   TREATMENT:                                                                                                                        Therapeutic exercise: therapeutic exercises that incorporate ONE parameter at one or more areas of the body to centralize symptoms, develop strength and endurance, range of motion, and flexibility required for successful completion of functional activities. Vitals check for systems review  Cervical spine AROM to check progress (see above).   Review of original HEP Seated cervical spine rotation SNAG with pillow case 2x10 each side with 5 second hold Needed cuing for correct hand placement and pillow case over zygomatic bone instead of jaw  Seated R UT stretch 2x30 seconds with light OP with  hand and opposite UE reaching towards floor Added to HEP  Seated AROM left side-bending (asterisk sign) to assess effects of interventions.   Manual therapy: to reduce pain and tissue tension, improve range of motion, neuromodulation, in order to promote improved ability to complete functional activities. SUPINE/HOOKLYING  Right cervical facet opening mobilizations along mid to lower cervical spine, grade II-III, up to 30 seconds each level.  Mild intermittent left UE paresthesia at lower cervical spine (changed positions).  Reproduction of R sided symptoms near C5  R UT PROM stretch 3x30 seconds  Education on HEP including handout   Pt required multimodal cuing for proper technique and to  facilitate improved neuromuscular control, strength, range of motion, and functional ability resulting in improved performance and form.   PATIENT EDUCATION:  Education details: Education on diagnosis/prognosis, plan of care, anatomy/physiology of current condition, HEP purpose/form.  Person educated: Patient Education method: Explanation, Demonstration, Verbal cues, and Handouts Education comprehension: verbalized understanding and needs further education  HOME EXERCISE PROGRAM: Access Code: [email or web address] URL: [email or web address] Date: 12/14/2023 Prepared by: Norton Blizzard  Exercises - Seated Assisted Cervical Rotation with Towel  - 1 x daily - 7 x weekly - 2 sets - 10 reps - 2-3 second hold - Seated Upper Trapezius Stretch  - 3 x daily - 2 reps - 30 seconds hold  ASSESSMENT:  CLINICAL IMPRESSION: Patient returns for first PT follow up one month following initial evaluation while she was dealing with issues related to Crohn's disease flair. Patient reports feeling "high" while on prednisone which she has been taking for chron's flair and she did seem to have difficulty following parts of session. However, she was able demonstrate fair form with HEP and only needed slight adjustments to perform properly. She has significantly improved pain ROM and PSFS and recalls that her symptoms improved prior to starting prednisone. She has met 3/3 short term goals and 1/4 long term goals while making progress towards remaining goals. She has lingering ROM deficits in L side-bending and L rotation with a feeling of tightness at the right side of the neck limiting her. She has not been able to drive or go to the gym due to issues with Crohn's treatment, so she has not been able to test how participation in these activities will affect her neck pain. Today's session focused on updating goals and checking progress since it had been so long since initial evaluation and completing interventions to  improve her remaining deficits. Patient tolerated treatment well with no lasting discomfort. She is now scheduled to come in at more regular intervals up to 2x a week. Patient would benefit from continued management of limiting condition by skilled physical therapist to address remaining impairments and functional limitations to work towards stated goals and return to PLOF or maximal functional independence.   From initial Evaluation on 11/16/2023:  Patient is a 67 y.o. female referred to outpatient physical therapy with a medical diagnosis of neck pain on right side who presents with signs and symptoms consistent with acute, constant neck pain on the right side with mobility deficits. Patient presents with significant cervical range of motion impairments and significant tissue tension in the musculature on the right side of her neck that are limiting her ability to complete all tasks that involve rotating her head, driving, exercising, and sewing without difficulty. Cervical and upper extremity muscle performance testing deferred for future visit due to high irritability with cervical motion. Patient provided education and multimodal cuing on initial HEP including  cervical rotational SNAGs with pillowcase. Patient demonstrated mild improvement in cervical rotation AROM following intervention. Patient will benefit from skilled physical therapy intervention to address current body structure impairments and activity limitations to improve function and work towards goals set in current POC in order to return to prior level of function or maximal functional improvement.    OBJECTIVE IMPAIRMENTS: decreased activity tolerance, decreased mobility, decreased ROM, hypomobility, and pain.   ACTIVITY LIMITATIONS: carrying, lifting, and sleeping  PARTICIPATION LIMITATIONS: driving and exercise (any task that involves rotating her head)  PERSONAL FACTORS: Past/current experiences and 3+ comorbidities: asthma, hx of  breast cancer, clotting disorder (HCC), COPD, depression, glaucoma, convulsions (unspecified convulsion type), osteoporosis, nonobstructive coronary artery disease, hx of pulmonary embolism (HCC)  are also affecting patient's functional outcome.   REHAB POTENTIAL: Good  CLINICAL DECISION MAKING: Stable/uncomplicated  EVALUATION COMPLEXITY: Low   GOALS: Goals reviewed with patient? No  SHORT TERM GOALS: Target date: 11/30/2023  Patient will be independent with initial home exercise program for self-management of symptoms. Baseline: Initial HEP provided at IE (11/16/23);  Goal status: MET  Patient will demonstrate improvement in Patient Specific Functional Scale (PSFS) of equal or greater than 3 points to reflect clinically significant improvement in patient's most valued functional activities.. Baseline: 3/10 at visit #1 (11/16/23); 10/10 at visit #2 (12/14/2023); Goal status: MET   3. Patient will report improvement in NPRS of equal or greater than 2 points during functional activities to improve their abilitly to complete community, work and/or recreational activities with less limitation. Baseline: 6/10 at worst (11/16/23); up to 3-4/10 in the last two weeks (12/14/2023);  Goal status: MET   LONG TERM GOALS: Target date: 02/08/2024  Patient will be independent with a long-term home exercise program for self-management of symptoms.  Baseline: Initial HEP provided at IE (11/16/23); participating in initial HEP (12/14/2023);  Goal status: In progress  2.  Patient will demonstrate improved cervical AROM to greater than or equal to 60 degrees for rotation and 30 degrees for flexion/extension/lateral flexion without pain to improve ability to drive and exercise with less difficulty.   Baseline: 18 degrees flexion, 19 degrees extension, 17 degrees R lateral flexion, 16 degrees L lateral flexion, 18 degrees R rotation, 23 degrees L rotation - pain with all ROM (11/16/23); met except 25  degrees left flexion and 45 degrees left rotation both with concordant symptoms (12/14/2023);  Goal status: In progress  3.  Patient will demonstrate improvement in Patient Specific Functional Scale (PSFS) of equal or greater than 8/10 points to reflect clinically significant improvement in patient's most valued functional activities. Baseline: 3/10 at visit #1 (11/16/23); 10/10 at visit #2 (12/14/2023); Goal status: MET  4.  Patient will report NPRS equal or less than 3/10 during functional activities during the last 2 weeks to improve their abilitly to complete community, work and/or recreational activities with less limitation. Baseline: 6/10 at worst (11/16/23); up to 3-4/10 in the last two weeks (12/14/2023);  Goal status: In progress  PLAN:  PT FREQUENCY: 1-2x/week  PT DURATION: 8-12 weeks  PLANNED INTERVENTIONS: 97164- PT Re-evaluation, 97110-Therapeutic exercises, 97530- Therapeutic activity, [id number]- Neuromuscular re-education, 97535- Self Care, [address]- Manual therapy, [id number]- Electrical stimulation (unattended), [phone]- Electrical stimulation (manual), Joint mobilization, Joint manipulation, Spinal manipulation, Spinal mobilization, Moist heat, and Biofeedback.  PLAN FOR NEXT SESSION: cervical spine mobility/ROM, assess cervical strength/endurance, manual therapy as needed, update HEP as appropriate    Huntley Dec R. Ilsa Iha, PT, DPT 12/14/23, 6:50 PM  [place] [facility] [address] [place], [place] [address] P: [phone] I F: [phone]

## 2023-12-20 ENCOUNTER — Ambulatory Visit
Admission: RE | Admit: 2023-12-20 | Discharge: 2023-12-20 | Disposition: A | Discharge status: Home / Self Care | Payer: Medicare Other | Source: Ambulatory Visit | Attending: Gastroenterology | Admitting: Gastroenterology

## 2023-12-20 ENCOUNTER — Encounter: Payer: Self-pay | Admitting: Physical Therapy

## 2023-12-20 ENCOUNTER — Ambulatory Visit: Payer: Medicare Other | Attending: Family Medicine | Admitting: Physical Therapy

## 2023-12-20 ENCOUNTER — Telehealth: Payer: Self-pay

## 2023-12-20 DIAGNOSIS — M542 Cervicalgia: Secondary | ICD-10-CM | POA: Insufficient documentation

## 2023-12-20 DIAGNOSIS — K5010 Crohn's disease of large intestine without complications: Secondary | ICD-10-CM | POA: Insufficient documentation

## 2023-12-20 LAB — SERIAL MONITORING

## 2023-12-20 MED ORDER — VEDOLIZUMAB 300 MG IV SOLR
300.0000 mg | Freq: Once | INTRAVENOUS | Status: AC
  Administered 2023-12-20: 300 mg via INTRAVENOUS
  Filled 2023-12-20: qty 5

## 2023-12-20 NOTE — Therapy (Signed)
 OUTPATIENT PHYSICAL THERAPY TREATMENT   Patient Name: Carolyn Campbell MRN: [id number] DOB:Jun 30, 1956, 68 y.o., female Today's Date: 12/20/2023  END OF SESSION:  PT End of Session - 12/20/23 1259     Visit Number 3    Number of Visits 17    Date for PT Re-Evaluation 02/08/24    Authorization Type [employer] - reporting period from 11/16/23    Authorization Time Period [id number] 2/26-4/23 for 16 Pt visits    Authorization - Visit Number 3    Authorization - Number of Visits 16    Progress Note Due on Visit 10    PT Start Time 0911    PT Stop Time 0947    PT Time Calculation (min) 36 min    Activity Tolerance Patient tolerated treatment well    Behavior During Therapy [facility] for tasks assessed/performed             Past Medical History:  Diagnosis Date   Abdominal pain, epigastric    Acute metabolic encephalopathy 07/22/2022   Allergic rhinitis    Arthritis    Asthma    Breast cancer (HCC) 2006   LT LUMPECTOMY   Cataract    Clotting disorder (HCC)    Cognitive deficits as late effect of cerebrovascular disease    Convulsions, unspecified convulsion type (HCC) 09/24/2022   COPD (chronic obstructive pulmonary disease) (HCC)    COVID-19 virus infection 04/2021   Crohn's disease (HCC) 05/21/2015   FOLLOWED BY GI   Crohn's disease of both small and large intestine with rectal bleeding (HCC) 12/04/2014   Depression    Currently taking zoloft.   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 05/2021 Echo: EF 60-65%, no rwma, nl RV fxn.   History of kidney stones    Hypercholesterolemia 01/18/2007   Hyperlipidemia    Hypertension    Hypothyroidism    Low back pain radiating to left leg 06/17/2021   Nonobstructive CAD (coronary  artery disease)    a. 10/2012 Cath: diffuse minor irregs-->med rx; b. 08/2021 Cor CTA: Ca2+ = 55.6 (77th%'ile), LAD 25p, LCX <25p. No signif non-cardiac findings.   Occlusion, cerebral artery    NOS w/infarction   Osteoporosis    Overweight (BMI 25.0-29.9) 07/17/2022   Personal history of radiation therapy 2006   BREAST CA   Pulmonary embolism (HCC)    a.04/2021 CTA Chest: Small filling defects are noted in upper and lower lobe branches of L PA-->acute PE-->eliquis. *PE occurred in context of COVID infxn.   Stroke [facility])    Ulcer    Past Surgical History:  Procedure Laterality Date   BREAST BIOPSY Left 2006   POS   BREAST LUMPECTOMY Left 09/20/2004   positive   BREAST SURGERY Left    malignant biopsy   CARDIAC CATHETERIZATION     COLONOSCOPY  2015   COLONOSCOPY WITH PROPOFOL N/A 06/08/2017   Procedure: COLONOSCOPY WITH PROPOFOL;  Surgeon: Toney Reil, MD;  Location: [facility];  Service: Gastroenterology;  Laterality: N/A;   COLONOSCOPY WITH PROPOFOL N/A 03/08/2019   Procedure: COLONOSCOPY WITH PROPOFOL;  Surgeon: Toney Reil, MD;  Location: [facility] ENDOSCOPY;  Service: Gastroenterology;  Laterality: N/A;   COLONOSCOPY WITH PROPOFOL N/A 07/16/2020  Procedure: COLONOSCOPY WITH PROPOFOL;  Surgeon: Toney Reil, MD;  Location: [facility] ENDOSCOPY;  Service: Gastroenterology;  Laterality: N/A;   COLONOSCOPY WITH PROPOFOL N/A 09/30/2022   Procedure: COLONOSCOPY WITH PROPOFOL;  Surgeon: Toney Reil, MD;  Location: [facility] ENDOSCOPY;  Service: Gastroenterology;  Laterality: N/A;   ESOPHAGOGASTRODUODENOSCOPY  2015   ESOPHAGOGASTRODUODENOSCOPY (EGD) WITH PROPOFOL N/A 06/08/2017   Procedure: ESOPHAGOGASTRODUODENOSCOPY (EGD) WITH PROPOFOL;  Surgeon: Toney Reil, MD;  Location: [facility];  Service: Gastroenterology;  Laterality: N/A;   ESOPHAGOGASTRODUODENOSCOPY (EGD) WITH PROPOFOL N/A 03/08/2019   Procedure: ESOPHAGOGASTRODUODENOSCOPY (EGD) WITH  PROPOFOL;  Surgeon: Toney Reil, MD;  Location: [facility] ENDOSCOPY;  Service: Gastroenterology;  Laterality: N/A;   EYE SURGERY     FINGER SURGERY     Right small finger   FRACTURE SURGERY     GIVENS CAPSULE STUDY  2015   TUBAL LIGATION     Patient Active Problem List   Diagnosis Date Noted   REM behavioral disorder 03/14/2023   Excessive daytime sleepiness 03/14/2023   Crohn's disease of large intestine without complication (HCC) 09/30/2022   Convulsions, unspecified convulsion type (HCC) 09/24/2022   Encounter for current long-term use of anticoagulants 09/24/2022   MDD (major depressive disorder), recurrent episode, mild (HCC) 08/18/2022   Restless leg syndrome 08/18/2022   Pulmonary embolism (HCC) 07/16/2022   Pain in limb 11/10/2021   CAD (coronary artery disease) 08/06/2021   Glaucoma 02/23/2021   Other long term (current) drug therapy 01/22/2021   Varicose veins of both lower extremities with pain 07/19/2018   Arthritis of right hand 12/21/2017   Major depression in remission (HCC) 12/21/2017   Fibrocystic breast changes 06/22/2017   Osteopenia 10/18/2016   Atherosclerosis of aorta (HCC) 12/19/2015   Vitreous degeneration 05/21/2015   Glaucoma associated with chamber angle anomalies 05/21/2015   H/O malignant neoplasm of breast 05/21/2015   Solitary pulmonary nodule 11/07/2012   Cognitive deficits as late effect of cerebrovascular disease 01/20/2010   CVA, old, hemiparesis (HCC) 04/28/2009   Acquired hypothyroidism 06/12/2008   HLD (hyperlipidemia) 01/18/2007   Primary hypertension 01/18/2007    PCP: Danelle Berry, PA-C  REFERRING PROVIDER: Danelle Berry, PA-C  REFERRING DIAG: Neck pain on right side  Rationale for Evaluation and Treatment: Rehabilitation  THERAPY DIAG:  Cervicalgia  ONSET DATE: Over a month ago, insidious onset neck pain  SUBJECTIVE:                                                                                                                                                                                            PERTINENT HISTORY:  Patient is a 68 y.o.  female who presents to outpatient physical therapy with a referral for medical diagnosis of neck pain on right side. This patient's chief complaints consist of insidious onset acute neck pain on the right side of her neck from the base of her skull down the the top of her shoulder, leading to the following functional deficits: difficulty and pain with tasks moving her head in all directions (particularly turning), driving, exercising, and sewing. Relevant past medical history and comorbidities include asthma, hx of breast cancer, clotting disorder (HCC), COPD, depression, glaucoma, convulsions (unspecified convulsion type), osteoporosis, nonobstructive coronary artery disease, hx of pulmonary embolism (HCC).     SUBJECTIVE STATEMENT: Patient states is running late this morning. She is feeling better overall and was able to drive herself to PT. She states she has 2 more days of Prednisone. She states she continues to feel something on the right side of her neck when she moves it, but it does not keep her from doing anything.   PAIN:  NPRS: Current: "I don't consider it pain, but I feel it when I move my head"   PRECAUTIONS: None  RED FLAGS: Patient denies hx of seizures, heart problems, diabetes, unexplained weight loss, unexplained changes in bowel or bladder problems, unexplained stumbling or dropping things, and spinal surgery    Patient has hx of breast cancer, blood clots in lungs, and osteoporosis.   PATIENT GOALS: Wants her neck to be better, wants to be able to "do what she wants to do"    OBJECTIVE:    TREATMENT:                                                                                                                        Therapeutic exercise: therapeutic exercises that incorporate ONE parameter at one or more areas of the body to centralize symptoms,  develop strength and endurance, range of motion, and flexibility required for successful completion of functional activities.  Cervical spine AROM throughout session to check asterisk (worst is L sidebending causes concordant feeling on R side, less so same feeling with R sidebending and left rotation).    Standing cervical thoracic extension/BUE flexion and serratus anterior activation, lat stretch, with foam roller up wall.  1x20  Standing wall walks up/down wall with B UE with isometric ER using TB around wrists.  1x8 with heavy cuing for technique 1 min rest 1x5 with tactile cuing 1 min rest 1x 5 with less cuing Added to HEP  (Manual therapy - see below)  Seated R UT stretch 1x45 seconds with light OP with hand and opposite UE reaching towards floor Good carry over  Seated cervical spine rotation SNAG with pillow case 1x10 each side with 5 second hold Needed cuing for correct hand placement and pillow case over zygomatic bone instead of jaw  Manual therapy: to reduce pain and tissue tension, improve range of motion, neuromodulation, in order to promote improved ability to complete functional activities. PRONE CPA grade I-IV along C2-T2, focused on  tender and stiff segments R UPA grade I-IV along C2-C7, focused at spot producing concordant pain near C5 STM to posterior cervical spine paraspinals, R > L, focused at location that reproduces concordant pain near C5 Improved asterisk sign following manual therapy   Education on HEP including handout   Pt required multimodal cuing for proper technique and to facilitate improved neuromuscular control, strength, range of motion, and functional ability resulting in improved performance and form.   PATIENT EDUCATION:  Education details: Education on diagnosis/prognosis, plan of care, anatomy/physiology of current condition, HEP purpose/form.  Person educated: Patient Education method: Explanation, Demonstration, Verbal cues, and  Handouts Education comprehension: verbalized understanding and needs further education  HOME EXERCISE PROGRAM: Access Code: [email or web address] URL: [email or web address] Date: 12/20/2023 Prepared by: Norton Blizzard  Exercises - Seated Assisted Cervical Rotation with Towel  - 1 x daily - 7 x weekly - 2 sets - 10 reps - 2-3 second hold - Seated Upper Trapezius Stretch  - 3 x daily - 2 reps - 30 seconds hold - Forearm Walks on Wall with Resistance Band  - 1 x daily - 3 sets - 5 reps  ASSESSMENT:  CLINICAL IMPRESSION: Today's session focused on improving periscapular muscle strength besides upper traps, manual therapy to help improve neck joint motion and soft tissue mobility followed by stretches and exercises to reinforce these motions. Patient with improved pain by end of session. Patient would benefit from continued management of limiting condition by skilled physical therapist to address remaining impairments and functional limitations to work towards stated goals and return to PLOF or maximal functional independence.   From initial Evaluation on 11/16/2023:  Patient is a 68 y.o. female referred to outpatient physical therapy with a medical diagnosis of neck pain on right side who presents with signs and symptoms consistent with acute, constant neck pain on the right side with mobility deficits. Patient presents with significant cervical range of motion impairments and significant tissue tension in the musculature on the right side of her neck that are limiting her ability to complete all tasks that involve rotating her head, driving, exercising, and sewing without difficulty. Cervical and upper extremity muscle performance testing deferred for future visit due to high irritability with cervical motion. Patient provided education and multimodal cuing on initial HEP including cervical rotational SNAGs with pillowcase. Patient demonstrated mild improvement in cervical rotation AROM following  intervention. Patient will benefit from skilled physical therapy intervention to address current body structure impairments and activity limitations to improve function and work towards goals set in current POC in order to return to prior level of function or maximal functional improvement.    OBJECTIVE IMPAIRMENTS: decreased activity tolerance, decreased mobility, decreased ROM, hypomobility, and pain.   ACTIVITY LIMITATIONS: carrying, lifting, and sleeping  PARTICIPATION LIMITATIONS: driving and exercise (any task that involves rotating her head)  PERSONAL FACTORS: Past/current experiences and 3+ comorbidities: asthma, hx of breast cancer, clotting disorder (HCC), COPD, depression, glaucoma, convulsions (unspecified convulsion type), osteoporosis, nonobstructive coronary artery disease, hx of pulmonary embolism (HCC)  are also affecting patient's functional outcome.   REHAB POTENTIAL: Good  CLINICAL DECISION MAKING: Stable/uncomplicated  EVALUATION COMPLEXITY: Low   GOALS: Goals reviewed with patient? No  SHORT TERM GOALS: Target date: 11/30/2023  Patient will be independent with initial home exercise program for self-management of symptoms. Baseline: Initial HEP provided at IE (11/16/23);  Goal status: MET  Patient will demonstrate improvement in Patient Specific Functional Scale (PSFS) of equal or greater than 3 points to reflect  clinically significant improvement in patient's most valued functional activities.. Baseline: 3/10 at visit #1 (11/16/23); 10/10 at visit #2 (12/14/2023); Goal status: MET   3. Patient will report improvement in NPRS of equal or greater than 2 points during functional activities to improve their abilitly to complete community, work and/or recreational activities with less limitation. Baseline: 6/10 at worst (11/16/23); up to 3-4/10 in the last two weeks (12/14/2023);  Goal status: MET   LONG TERM GOALS: Target date: 02/08/2024  Patient will be  independent with a long-term home exercise program for self-management of symptoms.  Baseline: Initial HEP provided at IE (11/16/23); participating in initial HEP (12/14/2023);  Goal status: In progress  2.  Patient will demonstrate improved cervical AROM to greater than or equal to 60 degrees for rotation and 30 degrees for flexion/extension/lateral flexion without pain to improve ability to drive and exercise with less difficulty.   Baseline: 18 degrees flexion, 19 degrees extension, 17 degrees R lateral flexion, 16 degrees L lateral flexion, 18 degrees R rotation, 23 degrees L rotation - pain with all ROM (11/16/23); met except 25 degrees left flexion and 45 degrees left rotation both with concordant symptoms (12/14/2023);  Goal status: In progress  3.  Patient will demonstrate improvement in Patient Specific Functional Scale (PSFS) of equal or greater than 8/10 points to reflect clinically significant improvement in patient's most valued functional activities. Baseline: 3/10 at visit #1 (11/16/23); 10/10 at visit #2 (12/14/2023); Goal status: MET  4.  Patient will report NPRS equal or less than 3/10 during functional activities during the last 2 weeks to improve their abilitly to complete community, work and/or recreational activities with less limitation. Baseline: 6/10 at worst (11/16/23); up to 3-4/10 in the last two weeks (12/14/2023);  Goal status: In progress  PLAN:  PT FREQUENCY: 1-2x/week  PT DURATION: 8-12 weeks  PLANNED INTERVENTIONS: 97164- PT Re-evaluation, 97110-Therapeutic exercises, 97530- Therapeutic activity, [id number]- Neuromuscular re-education, 97535- Self Care, 96045- Manual therapy, [id number]- Electrical stimulation (unattended), [phone]- Electrical stimulation (manual), Joint mobilization, Joint manipulation, Spinal manipulation, Spinal mobilization, Moist heat, and Biofeedback.  PLAN FOR NEXT SESSION: cervical spine mobility/ROM, assess cervical strength/endurance, manual  therapy as needed, update HEP as appropriate    Huntley Dec R. Ilsa Iha, PT, DPT 12/20/23, 1:02 PM  [facility] [address] [place], [place] [address] P: [phone] I F: [phone]

## 2023-12-20 NOTE — Therapy (Signed)
 OUTPATIENT PHYSICAL THERAPY TREATMENT   Patient Name: AMYBETH SIEG MRN: [id number] DOB:Oct 30, 1955, 68 y.o., female Today's Date: 12/20/2023  END OF SESSION:     Past Medical History:  Diagnosis Date   Abdominal pain, epigastric    Acute metabolic encephalopathy 07/22/2022   Allergic rhinitis    Arthritis    Asthma    Breast cancer (HCC) 2006   LT LUMPECTOMY   Cataract    Clotting disorder (HCC)    Cognitive deficits as late effect of cerebrovascular disease    Convulsions, unspecified convulsion type (HCC) 09/24/2022   COPD (chronic obstructive pulmonary disease) (HCC)    COVID-19 virus infection 04/2021   Crohn's disease (HCC) 05/21/2015   FOLLOWED BY GI   Crohn's disease of both small and large intestine with rectal bleeding (HCC) 12/04/2014   Depression    Currently taking zoloft.   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 05/2021 Echo: EF 60-65%, no rwma, nl RV fxn.   History of kidney stones    Hypercholesterolemia 01/18/2007   Hyperlipidemia    Hypertension    Hypothyroidism    Low back pain radiating to left leg 06/17/2021   Nonobstructive CAD (coronary artery disease)    a. 10/2012 Cath: diffuse minor irregs-->med rx; b. 08/2021 Cor CTA: Ca2+ = 55.6 (77th%'ile), LAD 25p, LCX <25p. No signif non-cardiac findings.   Occlusion, cerebral artery    NOS w/infarction   Osteoporosis    Overweight (BMI 25.0-29.9) 07/17/2022   Personal history of radiation therapy 2006   BREAST CA   Pulmonary embolism (HCC)    a.04/2021 CTA Chest: Small filling defects are noted in upper and lower lobe branches of L PA-->acute PE-->eliquis. *PE occurred in context of COVID infxn.   Stroke [facility])    Ulcer    Past Surgical History:   Procedure Laterality Date   BREAST BIOPSY Left 2006   POS   BREAST LUMPECTOMY Left 09/20/2004   positive   BREAST SURGERY Left    malignant biopsy   CARDIAC CATHETERIZATION     COLONOSCOPY  2015   COLONOSCOPY WITH PROPOFOL N/A 06/08/2017   Procedure: COLONOSCOPY WITH PROPOFOL;  Surgeon: Toney Reil, MD;  Location: [facility];  Service: Gastroenterology;  Laterality: N/A;   COLONOSCOPY WITH PROPOFOL N/A 03/08/2019   Procedure: COLONOSCOPY WITH PROPOFOL;  Surgeon: Toney Reil, MD;  Location: [facility] END[facility]COPY;  Service: Gastroenterology;  Laterality: N/A;   COLONOSCOPY WITH PROPOFOL N/A 07/16/2020   Procedure: COLONOSCOPY WITH PROPOFOL;  Surgeon: Toney Reil, MD;  Location: [facility] ENDOSCOPY;  Service: Gastroenterology;  Laterality: N/A;   COLONOSCOPY WITH PROPOFOL N/A 09/30/2022   Procedure: COLONOSCOPY WITH PROPOFOL;  Surgeon: Toney Reil, MD;  Location: [facility] END[facility]COPY;  Service: Gastroenterology;  Laterality: N/A;   ESOPHAGOGASTRODUODENOSCOPY  2015   ESOPHAGOGASTRODUODENOSCOPY (EGD) WITH PROPOFOL N/A 06/08/2017   Procedure: ESOPHAGOGASTRODUODENOSCOPY (EGD) WITH PROPOFOL;  Surgeon: Toney Reil, MD;  Location: [facility];  Service: Gastroenterology;  Laterality: N/A;   ESOPHAGOGASTRODUODENOSCOPY (EGD) WITH PROPOFOL N/A 03/08/2019   Procedure: ESOPHAGOGASTRODUODENOSCOPY (EGD) WITH PROPOFOL;  Surgeon: Toney Reil, MD;  Location: [facility] ENDOSCOPY;  Service: Gastroenterology;  Laterality: N/A;   EYE SURGERY     FINGER SURGERY  Right small finger   FRACTURE SURGERY     GIVENS CAPSULE STUDY  2015   TUBAL LIGATION     Patient Active Problem List   Diagnosis Date Noted   REM behavioral disorder 03/14/2023   Excessive daytime sleepiness 03/14/2023   Crohn's disease of large intestine without complication (HCC) 09/30/2022   Convulsions, unspecified convulsion type (HCC) 09/24/2022   Encounter for current long-term use of  anticoagulants 09/24/2022   MDD (major depressive disorder), recurrent episode, mild (HCC) 08/18/2022   Restless leg syndrome 08/18/2022   Pulmonary embolism (HCC) 07/16/2022   Pain in limb 11/10/2021   CAD (coronary artery disease) 08/06/2021   Glaucoma 02/23/2021   Other long term (current) drug therapy 01/22/2021   Varicose veins of both lower extremities with pain 07/19/2018   Arthritis of right hand 12/21/2017   Major depression in remission (HCC) 12/21/2017   Fibrocystic breast changes 06/22/2017   Osteopenia 10/18/2016   Atherosclerosis of aorta (HCC) 12/19/2015   Vitreous degeneration 05/21/2015   Glaucoma associated with chamber angle anomalies 05/21/2015   H/O malignant neoplasm of breast 05/21/2015   Solitary pulmonary nodule 11/07/2012   Cognitive deficits as late effect of cerebrovascular disease 01/20/2010   CVA, old, hemiparesis (HCC) 04/28/2009   Acquired hypothyroidism 06/12/2008   HLD (hyperlipidemia) 01/18/2007   Primary hypertension 01/18/2007    PCP: Danelle Berry, PA-C  REFERRING PROVIDER: Danelle Berry, PA-C  REFERRING DIAG: Neck pain on right side  Rationale for Evaluation and Treatment: Rehabilitation  THERAPY DIAG:  No diagnosis found.  ONSET DATE: Over a month ago, insidious onset neck pain  SUBJECTIVE:                                                                                                                                                                                           PERTINENT HISTORY:  Patient is a 68 y.o. female who presents to outpatient physical therapy with a referral for medical diagnosis of neck pain on right side. This patient's chief complaints consist of insidious onset acute neck pain on the right side of her neck from the base of her skull down the the top of her shoulder, leading to the following functional deficits: difficulty and pain with tasks moving her head in all directions (particularly turning), driving,  exercising, and sewing. Relevant past medical history and comorbidities include asthma, hx of breast cancer, clotting disorder (HCC), COPD, depression, glaucoma, convulsions (unspecified convulsion type), osteoporosis, nonobstructive coronary artery disease, hx of pulmonary embolism (HCC).     SUBJECTIVE STATEMENT: Patient states is running late this morning. She is feeling better overall and was able to drive herself  to PT. She states she has 2 more days of Prednisone. She states she continues to feel something on the right side of her neck when she moves it, but it does not keep her from doing anything.   PAIN:  NPRS: Current: "I don't consider it pain, but I feel it when I move my head"   PRECAUTIONS: None  RED FLAGS: Patient denies hx of seizures, heart problems, diabetes, unexplained weight loss, unexplained changes in bowel or bladder problems, unexplained stumbling or dropping things, and spinal surgery    Patient has hx of breast cancer, blood clots in lungs, and osteoporosis.   PATIENT GOALS: Wants her neck to be better, wants to be able to "do what she wants to do"    OBJECTIVE:    TREATMENT:                                                                                                                        Therapeutic exercise: therapeutic exercises that incorporate ONE parameter at one or more areas of the body to centralize symptoms, develop strength and endurance, range of motion, and flexibility required for successful completion of functional activities.  Cervical spine AROM to check asterisk (worst is L sidebending causes concordant feeling on R side, less so same feeling with R sidebending and left rotation).      Standing cervical thoracic extension/BUE flexion and serratus anterior activation, lat stretch, with foam roller up wall.  1x20  Standing wall walks up/down wall with B UE with isometric ER using TB around wrists.  1x8 with heavy cuing for technique 1  min rest 1x5 with tactile cuing 1 min rest 1x 5 with less cuing Added to HEP  (Manual therapy - see below)  Seated R UT stretch 1x45 seconds with light OP with hand and opposite UE reaching towards floor Good carry over  Seated cervical spine rotation SNAG with pillow case 1x10 each side with 5 second hold Needed cuing for correct hand placement and pillow case over zygomatic bone instead of jaw    Manual therapy: to reduce pain and tissue tension, improve range of motion, neuromodulation, in order to promote improved ability to complete functional activities. PRONE CPA grade I-IV along C2-T2, focused on tender and stiff segments R UPA grade I-IV along C2-C7, focused at spot producing concordant pain near C5 STM to posterior cervical spine paraspinals, R > L, focused at location that reproduces concordant pain near C5 Improved asterisk sign following manual therapy   Education on HEP including handout   Pt required multimodal cuing for proper technique and to facilitate improved neuromuscular control, strength, range of motion, and functional ability resulting in improved performance and form.   PATIENT EDUCATION:  Education details: Education on diagnosis/prognosis, plan of care, anatomy/physiology of current condition, HEP purpose/form.  Person educated: Patient Education method: Explanation, Demonstration, Verbal cues, and Handouts Education comprehension: verbalized understanding and needs further education  HOME EXERCISE PROGRAM: Access Code: [email or web address] URL: [email or web address] Date: 12/14/2023  Prepared by: Norton Blizzard  Exercises - Seated Assisted Cervical Rotation with Towel  - 1 x daily - 7 x weekly - 2 sets - 10 reps - 2-3 second hold - Seated Upper Trapezius Stretch  - 3 x daily - 2 reps - 30 seconds hold  ASSESSMENT:  CLINICAL IMPRESSION: Today's session focused on improving periscapular muscle strength besides upper traps, manual therapy to  help improve neck joint motion and soft tissue mobility followed by stretches and exercises to reinforce these motions. Patient with improved pain by end of session. Patient would benefit from continued management of limiting condition by skilled physical therapist to address remaining impairments and functional limitations to work towards stated goals and return to PLOF or maximal functional independence.   From initial Evaluation on 11/16/2023:  Patient is a 68 y.o. female referred to outpatient physical therapy with a medical diagnosis of neck pain on right side who presents with signs and symptoms consistent with acute, constant neck pain on the right side with mobility deficits. Patient presents with significant cervical range of motion impairments and significant tissue tension in the musculature on the right side of her neck that are limiting her ability to complete all tasks that involve rotating her head, driving, exercising, and sewing without difficulty. Cervical and upper extremity muscle performance testing deferred for future visit due to high irritability with cervical motion. Patient provided education and multimodal cuing on initial HEP including cervical rotational SNAGs with pillowcase. Patient demonstrated mild improvement in cervical rotation AROM following intervention. Patient will benefit from skilled physical therapy intervention to address current body structure impairments and activity limitations to improve function and work towards goals set in current POC in order to return to prior level of function or maximal functional improvement.    OBJECTIVE IMPAIRMENTS: decreased activity tolerance, decreased mobility, decreased ROM, hypomobility, and pain.   ACTIVITY LIMITATIONS: carrying, lifting, and sleeping  PARTICIPATION LIMITATIONS: driving and exercise (any task that involves rotating her head)  PERSONAL FACTORS: Past/current experiences and 3+ comorbidities: asthma, hx of  breast cancer, clotting disorder (HCC), COPD, depression, glaucoma, convulsions (unspecified convulsion type), osteoporosis, nonobstructive coronary artery disease, hx of pulmonary embolism (HCC)  are also affecting patient's functional outcome.   REHAB POTENTIAL: Good  CLINICAL DECISION MAKING: Stable/uncomplicated  EVALUATION COMPLEXITY: Low   GOALS: Goals reviewed with patient? No  SHORT TERM GOALS: Target date: 11/30/2023  Patient will be independent with initial home exercise program for self-management of symptoms. Baseline: Initial HEP provided at IE (11/16/23);  Goal status: MET  Patient will demonstrate improvement in Patient Specific Functional Scale (PSFS) of equal or greater than 3 points to reflect clinically significant improvement in patient's most valued functional activities.. Baseline: 3/10 at visit #1 (11/16/23); 10/10 at visit #2 (12/14/2023); Goal status: MET   3. Patient will report improvement in NPRS of equal or greater than 2 points during functional activities to improve their abilitly to complete community, work and/or recreational activities with less limitation. Baseline: 6/10 at worst (11/16/23); up to 3-4/10 in the last two weeks (12/14/2023);  Goal status: MET   LONG TERM GOALS: Target date: 02/08/2024  Patient will be independent with a long-term home exercise program for self-management of symptoms.  Baseline: Initial HEP provided at IE (11/16/23); participating in initial HEP (12/14/2023);  Goal status: In progress  2.  Patient will demonstrate improved cervical AROM to greater than or equal to 60 degrees for rotation and 30 degrees for flexion/extension/lateral flexion without pain to improve ability to  drive and exercise with less difficulty.   Baseline: 18 degrees flexion, 19 degrees extension, 17 degrees R lateral flexion, 16 degrees L lateral flexion, 18 degrees R rotation, 23 degrees L rotation - pain with all ROM (11/16/23); met except 25  degrees left flexion and 45 degrees left rotation both with concordant symptoms (12/14/2023);  Goal status: In progress  3.  Patient will demonstrate improvement in Patient Specific Functional Scale (PSFS) of equal or greater than 8/10 points to reflect clinically significant improvement in patient's most valued functional activities. Baseline: 3/10 at visit #1 (11/16/23); 10/10 at visit #2 (12/14/2023); Goal status: MET  4.  Patient will report NPRS equal or less than 3/10 during functional activities during the last 2 weeks to improve their abilitly to complete community, work and/or recreational activities with less limitation. Baseline: 6/10 at worst (11/16/23); up to 3-4/10 in the last two weeks (12/14/2023);  Goal status: In progress  PLAN:  PT FREQUENCY: 1-2x/week  PT DURATION: 8-12 weeks  PLANNED INTERVENTIONS: 97164- PT Re-evaluation, 97110-Therapeutic exercises, 97530- Therapeutic activity, [id number]- Neuromuscular re-education, 97535- Self Care, [id number]- Manual therapy, [id number]- Electrical stimulation (unattended), [phone]- Electrical stimulation (manual), Joint mobilization, Joint manipulation, Spinal manipulation, Spinal mobilization, Moist heat, and Biofeedback.  PLAN FOR NEXT SESSION: cervical spine mobility/ROM, assess cervical strength/endurance, manual therapy as needed, update HEP as appropriate    Huntley Dec R. Ilsa Iha, PT, DPT 12/20/23, 9:10 AM  [facility] [address] [place], [place] [address] P: [phone] I F: [phone]

## 2023-12-20 NOTE — Telephone Encounter (Signed)
-----   Message from [facility] sent at 12/20/2023  2:20 PM EDT ----- All the labs came back normal.  I will see her for colonoscopy as scheduled  RV

## 2023-12-20 NOTE — Telephone Encounter (Signed)
 Called patient and patient verbalized understanding of results

## 2023-12-21 LAB — CBC WITH DIFFERENTIAL/PLATELET
Basophils Absolute: 0 10*3/uL (ref 0.0–0.2)
Basos: 0 %
EOS (ABSOLUTE): 0 10*3/uL (ref 0.0–0.4)
Eos: 0 %
Hematocrit: 35.7 % (ref 34.0–46.6)
Hemoglobin: 11.6 g/dL (ref 11.1–15.9)
Immature Grans (Abs): 0 10*3/uL (ref 0.0–0.1)
Immature Granulocytes: 0 %
Lymphocytes Absolute: 0.9 10*3/uL (ref 0.7–3.1)
Lymphs: 11 %
MCH: 28.8 pg (ref 26.6–33.0)
MCHC: 32.5 g/dL (ref 31.5–35.7)
MCV: 89 fL (ref 79–97)
Monocytes Absolute: 0.2 10*3/uL (ref 0.1–0.9)
Monocytes: 2 %
Neutrophils Absolute: 7 10*3/uL (ref 1.4–7.0)
Neutrophils: 87 %
Platelets: 276 10*3/uL (ref 150–450)
RBC: 4.03 x10E6/uL (ref 3.77–5.28)
RDW: 13.8 % (ref 11.7–15.4)
WBC: 8.1 10*3/uL (ref 3.4–10.8)

## 2023-12-21 LAB — COMPREHENSIVE METABOLIC PANEL WITH GFR
ALT: 16 IU/L (ref 0–32)
AST: 14 IU/L (ref 0–40)
Albumin: 4.1 g/dL (ref 3.9–4.9)
Alkaline Phosphatase: 98 IU/L (ref 44–121)
BUN/Creatinine Ratio: 26 (ref 12–28)
BUN: 23 mg/dL (ref 8–27)
Bilirubin Total: 0.4 mg/dL (ref 0.0–1.2)
CO2: 25 mmol/L (ref 20–29)
Calcium: 9.8 mg/dL (ref 8.7–10.3)
Chloride: 98 mmol/L (ref 96–106)
Creatinine, Ser: 0.9 mg/dL (ref 0.57–1.00)
Globulin, Total: 2.8 g/dL (ref 1.5–4.5)
Glucose: 167 mg/dL — ABNORMAL HIGH (ref 70–99)
Potassium: 4 mmol/L (ref 3.5–5.2)
Sodium: 136 mmol/L (ref 134–144)
Total Protein: 6.9 g/dL (ref 6.0–8.5)
eGFR: 69 mL/min/{1.73_m2} (ref >59)

## 2023-12-21 LAB — VEDOLIZUMAB AND ANTI-VEDO AB
Anti-Vedolizumab Antibody: <25 ng/mL
Vedolizumab: 16 ug/mL

## 2023-12-21 LAB — C-REACTIVE PROTEIN: CRP: 2 mg/L (ref 0–10)

## 2023-12-22 ENCOUNTER — Encounter: Payer: Self-pay | Admitting: Physical Therapy

## 2023-12-22 ENCOUNTER — Ambulatory Visit: Payer: Medicare Other | Admitting: Physical Therapy

## 2023-12-22 DIAGNOSIS — M542 Cervicalgia: Secondary | ICD-10-CM | POA: Diagnosis not present

## 2023-12-22 NOTE — Therapy (Signed)
 OUTPATIENT PHYSICAL THERAPY TREATMENT   Patient Name: Carolyn Campbell MRN: [id number] DOB:[birth year], 68 y.o., female Today's Date: 12/22/2023  END OF SESSION:  PT End of Session - 12/22/23 1039     Visit Number 4    Number of Visits 17    Date for PT Re-Evaluation 02/08/24    Authorization Type [employer] - reporting period from 11/16/23    Authorization Time Period [id number] 2/26-4/23 for 16 Pt visits    Authorization - Visit Number 4    Authorization - Number of Visits 16    Progress Note Due on Visit 10    PT Start Time 1034    PT Stop Time 1114    PT Time Calculation (min) 40 min    Activity Tolerance Patient tolerated treatment well    Behavior During Therapy [facility] for tasks assessed/performed              Past Medical History:  Diagnosis Date   Abdominal pain, epigastric    Acute metabolic encephalopathy 07/22/2022   Allergic rhinitis    Arthritis    Asthma    Breast cancer (HCC) 2006   LT LUMPECTOMY   Cataract    Clotting disorder (HCC)    Cognitive deficits as late effect of cerebrovascular disease    Convulsions, unspecified convulsion type (HCC) 09/24/2022   COPD (chronic obstructive pulmonary disease) (HCC)    COVID-19 virus infection 04/2021   Crohn's disease (HCC) 05/21/2015   FOLLOWED BY GI   Crohn's disease of both small and large intestine with rectal bleeding (HCC) 12/04/2014   Depression    Currently taking zoloft.   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 05/2021 Echo: EF 60-65%, no rwma, nl RV fxn.   History of kidney stones    Hypercholesterolemia 01/18/2007   Hyperlipidemia    Hypertension    Hypothyroidism    Low back pain radiating to left leg 06/17/2021   Nonobstructive CAD (coronary  artery disease)    a. 10/2012 Cath: diffuse minor irregs-->med rx; b. 08/2021 Cor CTA: Ca2+ = 55.6 (77th%'ile), LAD 25p, LCX <25p. No signif non-cardiac findings.   Occlusion, cerebral artery    NOS w/infarction   Osteoporosis    Overweight (BMI 25.0-29.9) 07/17/2022   Personal history of radiation therapy 2006   BREAST CA   Pulmonary embolism (HCC)    a.04/2021 CTA Chest: Small filling defects are noted in upper and lower lobe branches of L PA-->acute PE-->eliquis. *PE occurred in context of COVID infxn.   Stroke [facility])    Ulcer    Past Surgical History:  Procedure Laterality Date   BREAST BIOPSY Left 2006   POS   BREAST LUMPECTOMY Left 09/20/2004   positive   BREAST SURGERY Left    malignant biopsy   CARDIAC CATHETERIZATION     COLONOSCOPY  2015   COLONOSCOPY WITH PROPOFOL N/A 06/08/2017   Procedure: COLONOSCOPY WITH PROPOFOL;  Surgeon: Toney Reil, MD;  Location: [facility];  Service: Gastroenterology;  Laterality: N/A;   COLONOSCOPY WITH PROPOFOL N/A 03/08/2019   Procedure: COLONOSCOPY WITH PROPOFOL;  Surgeon: Toney Reil, MD;  Location: [facility] ENDOSCOPY;  Service: Gastroenterology;  Laterality: N/A;   COLONOSCOPY WITH PROPOFOL N/A 07/16/2020  Procedure: COLONOSCOPY WITH PROPOFOL;  Surgeon: Toney Reil, MD;  Location: [facility] ENDOSCOPY;  Service: Gastroenterology;  Laterality: N/A;   COLONOSCOPY WITH PROPOFOL N/A 09/30/2022   Procedure: COLONOSCOPY WITH PROPOFOL;  Surgeon: Toney Reil, MD;  Location: [facility] ENDOSCOPY;  Service: Gastroenterology;  Laterality: N/A;   ESOPHAGOGASTRODUODENOSCOPY  2015   ESOPHAGOGASTRODUODENOSCOPY (EGD) WITH PROPOFOL N/A 06/08/2017   Procedure: ESOPHAGOGASTRODUODENOSCOPY (EGD) WITH PROPOFOL;  Surgeon: Toney Reil, MD;  Location: [facility];  Service: Gastroenterology;  Laterality: N/A;   ESOPHAGOGASTRODUODENOSCOPY (EGD) WITH PROPOFOL N/A 03/08/2019   Procedure: ESOPHAGOGASTRODUODENOSCOPY (EGD) WITH  PROPOFOL;  Surgeon: Toney Reil, MD;  Location: [facility] ENDOSCOPY;  Service: Gastroenterology;  Laterality: N/A;   EYE SURGERY     FINGER SURGERY     Right small finger   FRACTURE SURGERY     GIVENS CAPSULE STUDY  2015   TUBAL LIGATION     Patient Active Problem List   Diagnosis Date Noted   REM behavioral disorder 03/14/2023   Excessive daytime sleepiness 03/14/2023   Crohn's disease of large intestine without complication (HCC) 09/30/2022   Convulsions, unspecified convulsion type (HCC) 09/24/2022   Encounter for current long-term use of anticoagulants 09/24/2022   MDD (major depressive disorder), recurrent episode, mild (HCC) 08/18/2022   Restless leg syndrome 08/18/2022   Pulmonary embolism (HCC) 07/16/2022   Pain in limb 11/10/2021   CAD (coronary artery disease) 08/06/2021   Glaucoma 02/23/2021   Other long term (current) drug therapy 01/22/2021   Varicose veins of both lower extremities with pain 07/19/2018   Arthritis of right hand 12/21/2017   Major depression in remission (HCC) 12/21/2017   Fibrocystic breast changes 06/22/2017   Osteopenia 10/18/2016   Atherosclerosis of aorta (HCC) 12/19/2015   Vitreous degeneration 05/21/2015   Glaucoma associated with chamber angle anomalies 05/21/2015   H/O malignant neoplasm of breast 05/21/2015   Solitary pulmonary nodule 11/07/2012   Cognitive deficits as late effect of cerebrovascular disease 01/20/2010   CVA, old, hemiparesis (HCC) 04/28/2009   Acquired hypothyroidism 06/12/2008   HLD (hyperlipidemia) 01/18/2007   Primary hypertension 01/18/2007    PCP: Danelle Berry, PA-C  REFERRING PROVIDER: Danelle Berry, PA-C  REFERRING DIAG: Neck pain on right side  Rationale for Evaluation and Treatment: Rehabilitation  THERAPY DIAG:  Cervicalgia  ONSET DATE: Over a month ago, insidious onset neck pain  SUBJECTIVE:                                                                                                                                                                                            PERTINENT HISTORY:  Patient is a 68 y.o.  female who presents to outpatient physical therapy with a referral for medical diagnosis of neck pain on right side. This patient's chief complaints consist of insidious onset acute neck pain on the right side of her neck from the base of her skull down the the top of her shoulder, leading to the following functional deficits: difficulty and pain with tasks moving her head in all directions (particularly turning), driving, exercising, and sewing. Relevant past medical history and comorbidities include asthma, hx of breast cancer, clotting disorder (HCC), COPD, depression, glaucoma, convulsions (unspecified convulsion type), osteoporosis, nonobstructive coronary artery disease, hx of pulmonary embolism (HCC).     SUBJECTIVE STATEMENT: Patient states she is feeling well today and her neck is better. She feels a "little pinch" when tipping her head to the left but she thinks she feels the same thing on the other side. Today is her last day of prednisone.   PAIN:  NPRS: does not provide a pain rating, but feels "a little pinch"   PRECAUTIONS: None  RED FLAGS: Patient denies hx of seizures, heart problems, diabetes, unexplained weight loss, unexplained changes in bowel or bladder problems, unexplained stumbling or dropping things, and spinal surgery    Patient has hx of breast cancer, blood clots in lungs, and osteoporosis.   PATIENT GOALS: Wants her neck to be better, wants to be able to "do what she wants to do"    OBJECTIVE:    TREATMENT:                                                                                                                        Therapeutic exercise: therapeutic exercises that incorporate ONE parameter at one or more areas of the body to centralize symptoms, develop strength and endurance, range of motion, and flexibility required for successful  completion of functional activities.  Cervical spine AROM throughout session to check asterisk (worst was L sidebending causes concordant feeling on R side, less so same feeling with R sidebending and left rotation).    Standing cervical thoracic extension/BUE flexion and serratus anterior activation, lat stretch, with foam roller up wall.  1x20  Neuromuscular Re-education: a technique or exercise performed with the goal of improving the level of communication between the body and the brain, such as for balance, motor control, muscle activation patterns, coordination, desensitization, quality of muscle contraction, proprioception, and/or kinesthetic sense needed for successful and safe completion of functional activities.   Standing wall walks up/down wall with B UE with isometric ER using TB around wrists.  2x10 with moderate cuing for technique Difficulty with scapular protraction with depression  Standing wall scapular protraction/retraction with elbows on wall 3x20 to improve motor control kinesthetic awareness of this movement.  Spent continued more practice  with various cuing and trials of attempting to transfer scapular protraction on elbows to the same movement during wall walks and even on hands instead of scapulae.   Education on HEP including handout   Pt required multimodal cuing for proper  technique and to facilitate improved neuromuscular control, strength, range of motion, and functional ability resulting in improved performance and form.   PATIENT EDUCATION:  Education details: Education on diagnosis/prognosis, plan of care, anatomy/physiology of current condition, HEP purpose/form.  Person educated: Patient Education method: Explanation, Demonstration, Verbal cues, and Handouts Education comprehension: verbalized understanding and needs further education  HOME EXERCISE PROGRAM: Access Code: [email or web address] URL: [email or web address] Date: 12/22/2023 Prepared  by: Norton Blizzard  Exercises - Seated Assisted Cervical Rotation with Towel  - 1 x daily - 7 x weekly - 2 sets - 10 reps - 2-3 second hold - Seated Upper Trapezius Stretch  - 3 x daily - 2 reps - 30 seconds hold - Forearm Walks on Wall with Resistance Band  - 1 x daily - 3 sets - 5 reps - Scapular Protraction at Wall  - 3 x daily - 1 sets - 20 reps  ASSESSMENT:  CLINICAL IMPRESSION: Patient again arrives with low pain but has not discontinued prednisone yet. Today's session focused more on improving motor control of shoulder girdle to decrease overuse of upper traps. Patient able to perform scapular protraction with elbows on wall but unable to reproduce movement reliably and with full ROM when moving back to hands or doing wall walks, suggesting she has not integrated the ability to voluntarily/purposefully protract scapulae when using B UE. Plan to continue working on motor control and changing motor patterns to decrease likelihood of return of symptoms. Patient would benefit from continued management of limiting condition by skilled physical therapist to address remaining impairments and functional limitations to work towards stated goals and return to PLOF or maximal functional independence.    From initial Evaluation on 11/16/2023:  Patient is a 68 y.o. female referred to outpatient physical therapy with a medical diagnosis of neck pain on right side who presents with signs and symptoms consistent with acute, constant neck pain on the right side with mobility deficits. Patient presents with significant cervical range of motion impairments and significant tissue tension in the musculature on the right side of her neck that are limiting her ability to complete all tasks that involve rotating her head, driving, exercising, and sewing without difficulty. Cervical and upper extremity muscle performance testing deferred for future visit due to high irritability with cervical motion. Patient provided  education and multimodal cuing on initial HEP including cervical rotational SNAGs with pillowcase. Patient demonstrated mild improvement in cervical rotation AROM following intervention. Patient will benefit from skilled physical therapy intervention to address current body structure impairments and activity limitations to improve function and work towards goals set in current POC in order to return to prior level of function or maximal functional improvement.    OBJECTIVE IMPAIRMENTS: decreased activity tolerance, decreased mobility, decreased ROM, hypomobility, and pain.   ACTIVITY LIMITATIONS: carrying, lifting, and sleeping  PARTICIPATION LIMITATIONS: driving and exercise (any task that involves rotating her head)  PERSONAL FACTORS: Past/current experiences and 3+ comorbidities: asthma, hx of breast cancer, clotting disorder (HCC), COPD, depression, glaucoma, convulsions (unspecified convulsion type), osteoporosis, nonobstructive coronary artery disease, hx of pulmonary embolism (HCC)  are also affecting patient's functional outcome.   REHAB POTENTIAL: Good  CLINICAL DECISION MAKING: Stable/uncomplicated  EVALUATION COMPLEXITY: Low   GOALS: Goals reviewed with patient? No  SHORT TERM GOALS: Target date: 11/30/2023  Patient will be independent with initial home exercise program for self-management of symptoms. Baseline: Initial HEP provided at IE (11/16/23);  Goal status: MET  Patient will demonstrate improvement in Patient Specific Functional  Scale (PSFS) of equal or greater than 3 points to reflect clinically significant improvement in patient's most valued functional activities.. Baseline: 3/10 at visit #1 (11/16/23); 10/10 at visit #2 (12/14/2023); Goal status: MET   3. Patient will report improvement in NPRS of equal or greater than 2 points during functional activities to improve their abilitly to complete community, work and/or recreational activities with less  limitation. Baseline: 6/10 at worst (11/16/23); up to 3-4/10 in the last two weeks (12/14/2023);  Goal status: MET   LONG TERM GOALS: Target date: 02/08/2024  Patient will be independent with a long-term home exercise program for self-management of symptoms.  Baseline: Initial HEP provided at IE (11/16/23); participating in initial HEP (12/14/2023);  Goal status: In progress  2.  Patient will demonstrate improved cervical AROM to greater than or equal to 60 degrees for rotation and 30 degrees for flexion/extension/lateral flexion without pain to improve ability to drive and exercise with less difficulty.   Baseline: 18 degrees flexion, 19 degrees extension, 17 degrees R lateral flexion, 16 degrees L lateral flexion, 18 degrees R rotation, 23 degrees L rotation - pain with all ROM (11/16/23); met except 25 degrees left flexion and 45 degrees left rotation both with concordant symptoms (12/14/2023);  Goal status: In progress  3.  Patient will demonstrate improvement in Patient Specific Functional Scale (PSFS) of equal or greater than 8/10 points to reflect clinically significant improvement in patient's most valued functional activities. Baseline: 3/10 at visit #1 (11/16/23); 10/10 at visit #2 (12/14/2023); Goal status: MET  4.  Patient will report NPRS equal or less than 3/10 during functional activities during the last 2 weeks to improve their abilitly to complete community, work and/or recreational activities with less limitation. Baseline: 6/10 at worst (11/16/23); up to 3-4/10 in the last two weeks (12/14/2023);  Goal status: In progress  PLAN:  PT FREQUENCY: 1-2x/week  PT DURATION: 8-12 weeks  PLANNED INTERVENTIONS: 97164- PT Re-evaluation, 97110-Therapeutic exercises, 97530- Therapeutic activity, [id number]- Neuromuscular re-education, 97535- Self Care, 36644- Manual therapy, [id number]- Electrical stimulation (unattended), [phone]- Electrical stimulation (manual), Joint mobilization, Joint  manipulation, Spinal manipulation, Spinal mobilization, Moist heat, and Biofeedback.  PLAN FOR NEXT SESSION: cervical spine mobility/ROM, assess cervical strength/endurance, manual therapy as needed, update HEP as appropriate    Huntley Dec R. Ilsa Iha, PT, DPT 12/22/23, 8:38 PM  [facility] [address] [place], [place] [address] P: [phone] I F: [phone]

## 2023-12-27 ENCOUNTER — Encounter: Payer: Self-pay | Admitting: Physical Therapy

## 2023-12-27 ENCOUNTER — Ambulatory Visit: Payer: Medicare Other | Admitting: Physical Therapy

## 2023-12-27 DIAGNOSIS — M542 Cervicalgia: Secondary | ICD-10-CM | POA: Diagnosis not present

## 2023-12-27 NOTE — Anesthesia Preprocedure Evaluation (Addendum)
 Anesthesia Evaluation  Patient identified by MRN, date of birth, ID band Patient awake    Reviewed: Allergy & Precautions, H&P , NPO status , Patient's Chart, lab work & pertinent test results  Airway Mallampati: II  TM Distance: >3 FB Neck ROM: Full    Dental no notable dental hx.  Some missing, but most intact, none chipped:   Pulmonary neg pulmonary ROS, shortness of breath, asthma , COPD   Pulmonary exam normal breath sounds clear to auscultation       Cardiovascular hypertension, + CAD  negative cardio ROS Normal cardiovascular exam Rhythm:Regular Rate:Normal  06-11-21 echo 1. Left ventricular ejection fraction, by estimation, is 60 to 65%. The  left ventricle has normal function. The left ventricle has no regional  wall motion abnormalities. Left ventricular diastolic parameters were  normal. The average left ventricular  global longitudinal strain is -16.3 %.   2. Right ventricular systolic function is normal. The right ventricular  size is normal.   3. The mitral valve is normal in structure. No evidence of mitral valve  regurgitation.   4. The aortic valve is tricuspid. Aortic valve regurgitation is not  visualized.   5. The inferior vena cava is normal in size with greater than 50%  respiratory variability, suggesting right atrial pressure of 3 mmHg.     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  Cognitive deficits as late effect of cerebrovascular disease Glaucoma  Crohn's disease of both small and large intestine with rectal bleeding (HCC) Stroke (HCC)  Arthritis COPD (chronic obstructive pulmonary disease) (HCC)  Dyspnea History of kidney stones  Personal history of radiation therapy Fever blister  Depression Clotting disorder   Ulcer Abdominal pain, epigastric  Dermatitis, eczematoid Breast cancer (HCC)  Nonobstructive CAD (coronary artery disease) History of echocardiogram  COVID-19 virus infection Pulmonary embolism  Crohn's disease  Overweight (BMI 25.0-29.9) Hypercholesterolemia Low back pain radiating to left leg  Acute metabolic encephalopathy Convulsions, unspecified convulsion type Encounter for current long-term use of anticoagulants Cataract     Reproductive/Obstetrics negative OB ROS                             Anesthesia Physical Anesthesia Plan  ASA: 3  Anesthesia Plan: General   Post-op Pain Management:    Induction: Intravenous  PONV Risk Score and Plan:   Airway Management Planned: Natural Airway and Nasal Cannula  Additional Equipment:   Intra-op Plan:   Post-operative Plan:   Informed Consent: I have reviewed the patients History and Physical, chart, labs and discussed the procedure including the risks, benefits and alternatives for the proposed anesthesia with the patient or authorized representative who has indicated his/her understanding and acceptance.     Dental Advisory Given  Plan Discussed with: Anesthesiologist, CRNA and Surgeon  Anesthesia Plan Comments: (Patient consented for risks of anesthesia including but not limited to:  - adverse reactions to  medications - risk of airway placement if required - damage to eyes, teeth, lips or other oral mucosa - nerve damage due  to positioning  - sore throat or hoarseness - Damage to heart, brain, nerves, lungs, other parts of body or loss of life  Patient voiced understanding and assent.)       Anesthesia Quick Evaluation

## 2023-12-27 NOTE — Therapy (Signed)
 OUTPATIENT PHYSICAL THERAPY TREATMENT   Patient Name: Carolyn Campbell MRN: [id number] DOB:[birth year], 68 y.o., female Today's Date: 12/27/2023  END OF SESSION:  PT End of Session - 12/27/23 1357     Visit Number 5    Number of Visits 17    Date for PT Re-Evaluation 02/08/24    Authorization Type [employer] - reporting period from 11/16/23    Authorization Time Period [id number] 2/26-4/23 for 16 Pt visits    Authorization - Visit Number 5    Authorization - Number of Visits 16    Progress Note Due on Visit 10    PT Start Time 1352    PT Stop Time 1430    PT Time Calculation (min) 38 min    Activity Tolerance Patient tolerated treatment well    Behavior During Therapy [facility] for tasks assessed/performed               Past Medical History:  Diagnosis Date   Abdominal pain, epigastric    Acute metabolic encephalopathy 07/22/2022   Allergic rhinitis    Arthritis    Asthma    Breast cancer (HCC) 2006   LT LUMPECTOMY   Cataract    Clotting disorder (HCC)    Cognitive deficits as late effect of cerebrovascular disease    Convulsions, unspecified convulsion type (HCC) 09/24/2022   COPD (chronic obstructive pulmonary disease) (HCC)    COVID-19 virus infection 04/2021   Crohn's disease (HCC) 05/21/2015   FOLLOWED BY GI   Crohn's disease of both small and large intestine with rectal bleeding (HCC) 12/04/2014   Depression    Currently taking zoloft.   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 05/2021 Echo: EF 60-65%, no rwma, nl RV fxn.   History of kidney stones    Hypercholesterolemia 01/18/2007   Hyperlipidemia    Hypertension    Hypothyroidism    Low back pain radiating to left leg 06/17/2021   Nonobstructive CAD  (coronary artery disease)    a. 10/2012 Cath: diffuse minor irregs-->med rx; b. 08/2021 Cor CTA: Ca2+ = 55.6 (77th%'ile), LAD 25p, LCX <25p. No signif non-cardiac findings.   Occlusion, cerebral artery    NOS w/infarction   Osteoporosis    Overweight (BMI 25.0-29.9) 07/17/2022   Personal history of radiation therapy 2006   BREAST CA   Pulmonary embolism (HCC)    a.04/2021 CTA Chest: Small filling defects are noted in upper and lower lobe branches of L PA-->acute PE-->eliquis. *PE occurred in context of COVID infxn.   Stroke [facility])    Ulcer    Past Surgical History:  Procedure Laterality Date   BREAST BIOPSY Left 2006   POS   BREAST LUMPECTOMY Left 09/20/2004   positive   BREAST SURGERY Left    malignant biopsy   CARDIAC CATHETERIZATION     COLONOSCOPY  2015   COLONOSCOPY WITH PROPOFOL N/A 06/08/2017   Procedure: COLONOSCOPY WITH PROPOFOL;  Surgeon: Toney Reil, MD;  Location: [facility] SURGERY [facility];  Service: Gastroenterology;  Laterality: N/A;   COLONOSCOPY WITH PROPOFOL N/A 03/08/2019   Procedure: COLONOSCOPY WITH PROPOFOL;  Surgeon: Toney Reil, MD;  Location: [facility] ENDOSCOPY;  Service: Gastroenterology;  Laterality: N/A;   COLONOSCOPY WITH PROPOFOL N/A 07/16/2020  Procedure: COLONOSCOPY WITH PROPOFOL;  Surgeon: Toney Reil, MD;  Location: [facility] ENDOSCOPY;  Service: Gastroenterology;  Laterality: N/A;   COLONOSCOPY WITH PROPOFOL N/A 09/30/2022   Procedure: COLONOSCOPY WITH PROPOFOL;  Surgeon: Toney Reil, MD;  Location: [facility] ENDOSCOPY;  Service: Gastroenterology;  Laterality: N/A;   ESOPHAGOGASTRODUODENOSCOPY  2015   ESOPHAGOGASTRODUODENOSCOPY (EGD) WITH PROPOFOL N/A 06/08/2017   Procedure: ESOPHAGOGASTRODUODENOSCOPY (EGD) WITH PROPOFOL;  Surgeon: Toney Reil, MD;  Location: [facility] SURGERY [facility]TR;  Service: Gastroenterology;  Laterality: N/A;   ESOPHAGOGASTRODUODENOSCOPY (EGD) WITH PROPOFOL N/A 03/08/2019   Procedure: ESOPHAGOGASTRODUODENOSCOPY  (EGD) WITH PROPOFOL;  Surgeon: Toney Reil, MD;  Location: [facility] ENDOSCOPY;  Service: Gastroenterology;  Laterality: N/A;   EYE SURGERY     FINGER SURGERY     Right small finger   FRACTURE SURGERY     GIVENS CAPSULE STUDY  2015   TUBAL LIGATION     Patient Active Problem List   Diagnosis Date Noted   REM behavioral disorder 03/14/2023   Excessive daytime sleepiness 03/14/2023   Crohn's disease of large intestine without complication (HCC) 09/30/2022   Convulsions, unspecified convulsion type (HCC) 09/24/2022   Encounter for current long-term use of anticoagulants 09/24/2022   MDD (major depressive disorder), recurrent episode, mild (HCC) 08/18/2022   Restless leg syndrome 08/18/2022   Pulmonary embolism (HCC) 07/16/2022   Pain in limb 11/10/2021   CAD (coronary artery disease) 08/06/2021   Glaucoma 02/23/2021   Other long term (current) drug therapy 01/22/2021   Varicose veins of both lower extremities with pain 07/19/2018   Arthritis of right hand 12/21/2017   Major depression in remission (HCC) 12/21/2017   Fibrocystic breast changes 06/22/2017   Osteopenia 10/18/2016   Atherosclerosis of aorta (HCC) 12/19/2015   Vitreous degeneration 05/21/2015   Glaucoma associated with chamber angle anomalies 05/21/2015   H/O malignant neoplasm of breast 05/21/2015   Solitary pulmonary nodule 11/07/2012   Cognitive deficits as late effect of cerebrovascular disease 01/20/2010   CVA, old, hemiparesis (HCC) 04/28/2009   Acquired hypothyroidism 06/12/2008   HLD (hyperlipidemia) 01/18/2007   Primary hypertension 01/18/2007    PCP: Danelle Berry, PA-C  REFERRING PROVIDER: Danelle Berry, PA-C  REFERRING DIAG: Neck pain on right side  Rationale for Evaluation and Treatment: Rehabilitation  THERAPY DIAG:  Cervicalgia  ONSET DATE: Over a month ago, insidious onset neck pain  SUBJECTIVE:                                                                                                                                                                                            PERTINENT HISTORY:  Patient is a 68 y.o.  female who presents to outpatient physical therapy with a referral for medical diagnosis of neck pain on right side. This patient's chief complaints consist of insidious onset acute neck pain on the right side of her neck from the base of her skull down the the top of her shoulder, leading to the following functional deficits: difficulty and pain with tasks moving her head in all directions (particularly turning), driving, exercising, and sewing. Relevant past medical history and comorbidities include asthma, hx of breast cancer, clotting disorder (HCC), COPD, depression, glaucoma, convulsions (unspecified convulsion type), osteoporosis, nonobstructive coronary artery disease, hx of pulmonary embolism (HCC).     Exercise history: likes to take exercise classes at the gym, but hasn't been for about 6 months due to health issues.   SUBJECTIVE STATEMENT: Patient states her neck has been doing really well with no pain with movement now.  She is done with her prednisone. She is thinking about going back to the gym next week. She likes to take classes there.   PAIN:  NPRS: 0/10   PRECAUTIONS: None  RED FLAGS: Patient denies hx of seizures, heart problems, diabetes, unexplained weight loss, unexplained changes in bowel or bladder problems, unexplained stumbling or dropping things, and spinal surgery    Patient has hx of breast cancer, blood clots in lungs, and osteoporosis.   PATIENT GOALS: Wants her neck to be better, wants to be able to "do what she wants to do"   OBJECTIVE:    TREATMENT:                                                                                                                        Therapeutic exercise: therapeutic exercises that incorporate ONE parameter at one or more areas of the body to centralize symptoms, develop strength and  endurance, range of motion, and flexibility required for successful completion of functional activities.  Cervical spine AROM throughout session to check asterisk (no pain at start of visit).    Standing cervical thoracic extension/BUE flexion and serratus anterior activation, lat stretch, with foam roller up wall.  1x20  Seated cervical spine rotation SNAG with pillow case 2x10 each side with 5 second hold Needed cuing for correct hand placement and pillow case over zygomatic bone instead of jaw   Seated UT stretch with contralateral hand reaching towards floor and ipsilateral hand gently providing OP 3x30 seconds each side Cuing for how long to hold stretch  Neuromuscular Re-education: a technique or exercise performed with the goal of improving the level of communication between the body and the brain, such as for balance, motor control, muscle activation patterns, coordination, desensitization, quality of muscle contraction, proprioception, and/or kinesthetic sense needed for successful and safe completion of functional activities.   Standing wall scapular protraction/retraction with elbows on wall 1x20 to improve motor control kinesthetic awareness of this movement.  Forgot movement and was just holding in protraction at first  Standing wall scapular protraction/retraction with hands on wall 1x20 to improve motor  control kinesthetic awareness of this movement.  Needed a lot of cuing at first to move scapulae instead of elbows  Standing wall walks up/down wall in scapular protraction 1x10 Improved scapular protraction  Standing wall walks up/down wall with B UE with isometric ER using TB around wrists.  2x10 with YTB with moderate cuing for technique Improved scapular protraction "pushed in" is a helpful cue for her  Therapeutic exercise: therapeutic exercises that incorporate ONE parameter at one or more areas of the body to centralize symptoms, develop strength and endurance,  range of motion, and flexibility required for successful completion of functional activities.    Pt required multimodal cuing for proper technique and to facilitate improved neuromuscular control, strength, range of motion, and functional ability resulting in improved performance and form.   PATIENT EDUCATION:  Education details: Education on diagnosis/prognosis, plan of care, anatomy/physiology of current condition, HEP purpose/form.  Person educated: Patient Education method: Explanation, Demonstration, Verbal cues, and Handouts Education comprehension: verbalized understanding and needs further education  HOME EXERCISE PROGRAM: Access Code: [email or web address] URL: [email or web address] Date: 12/22/2023 Prepared by: Norton Blizzard  Exercises - Seated Assisted Cervical Rotation with Towel  - 1 x daily - 7 x weekly - 2 sets - 10 reps - 2-3 second hold - Seated Upper Trapezius Stretch  - 3 x daily - 2 reps - 30 seconds hold - Forearm Walks on Wall with Resistance Band  - 1 x daily - 3 sets - 5 reps - Scapular Protraction at Wall  - 3 x daily - 1 sets - 20 reps  ASSESSMENT:  CLINICAL IMPRESSION: Patient with improved pain with cervical spine ROM after coming off of prednisone. She continued to need cuing and step by step progressions in wall exercises utilizing scapular protraction but was able to improve carry over to wall walk exercise, much better than last week. Experienced appropriate fatigue with strengthening exercises. Patient would benefit from continued management of limiting condition by skilled physical therapist to address remaining impairments and functional limitations to work towards stated goals and return to PLOF or maximal functional independence.   From initial Evaluation on 11/16/2023:  Patient is a 69 y.o. female referred to outpatient physical therapy with a medical diagnosis of neck pain on right side who presents with signs and symptoms consistent with acute,  constant neck pain on the right side with mobility deficits. Patient presents with significant cervical range of motion impairments and significant tissue tension in the musculature on the right side of her neck that are limiting her ability to complete all tasks that involve rotating her head, driving, exercising, and sewing without difficulty. Cervical and upper extremity muscle performance testing deferred for future visit due to high irritability with cervical motion. Patient provided education and multimodal cuing on initial HEP including cervical rotational SNAGs with pillowcase. Patient demonstrated mild improvement in cervical rotation AROM following intervention. Patient will benefit from skilled physical therapy intervention to address current body structure impairments and activity limitations to improve function and work towards goals set in current POC in order to return to prior level of function or maximal functional improvement.    OBJECTIVE IMPAIRMENTS: decreased activity tolerance, decreased mobility, decreased ROM, hypomobility, and pain.   ACTIVITY LIMITATIONS: carrying, lifting, and sleeping  PARTICIPATION LIMITATIONS: driving and exercise (any task that involves rotating her head)  PERSONAL FACTORS: Past/current experiences and 3+ comorbidities: asthma, hx of breast cancer, clotting disorder (HCC), COPD, depression, glaucoma, convulsions (unspecified convulsion type), osteoporosis, nonobstructive coronary artery disease, hx of  pulmonary embolism (HCC)  are also affecting patient's functional outcome.   REHAB POTENTIAL: Good  CLINICAL DECISION MAKING: Stable/uncomplicated  EVALUATION COMPLEXITY: Low   GOALS: Goals reviewed with patient? No  SHORT TERM GOALS: Target date: 11/30/2023  Patient will be independent with initial home exercise program for self-management of symptoms. Baseline: Initial HEP provided at IE (11/16/23);  Goal status: MET  Patient will demonstrate  improvement in Patient Specific Functional Scale (PSFS) of equal or greater than 3 points to reflect clinically significant improvement in patient's most valued functional activities.. Baseline: 3/10 at visit #1 (11/16/23); 10/10 at visit #2 (12/14/2023); Goal status: MET   3. Patient will report improvement in NPRS of equal or greater than 2 points during functional activities to improve their abilitly to complete community, work and/or recreational activities with less limitation. Baseline: 6/10 at worst (11/16/23); up to 3-4/10 in the last two weeks (12/14/2023);  Goal status: MET   LONG TERM GOALS: Target date: 02/08/2024  Patient will be independent with a long-term home exercise program for self-management of symptoms.  Baseline: Initial HEP provided at IE (11/16/23); participating in initial HEP (12/14/2023);  Goal status: In progress  2.  Patient will demonstrate improved cervical AROM to greater than or equal to 60 degrees for rotation and 30 degrees for flexion/extension/lateral flexion without pain to improve ability to drive and exercise with less difficulty.   Baseline: 18 degrees flexion, 19 degrees extension, 17 degrees R lateral flexion, 16 degrees L lateral flexion, 18 degrees R rotation, 23 degrees L rotation - pain with all ROM (11/16/23); met except 25 degrees left flexion and 45 degrees left rotation both with concordant symptoms (12/14/2023);  Goal status: In progress  3.  Patient will demonstrate improvement in Patient Specific Functional Scale (PSFS) of equal or greater than 8/10 points to reflect clinically significant improvement in patient's most valued functional activities. Baseline: 3/10 at visit #1 (11/16/23); 10/10 at visit #2 (12/14/2023); Goal status: MET  4.  Patient will report NPRS equal or less than 3/10 during functional activities during the last 2 weeks to improve their abilitly to complete community, work and/or recreational activities with less  limitation. Baseline: 6/10 at worst (11/16/23); up to 3-4/10 in the last two weeks (12/14/2023);  Goal status: In progress  PLAN:  PT FREQUENCY: 1-2x/week  PT DURATION: 8-12 weeks  PLANNED INTERVENTIONS: 97164- PT Re-evaluation, 97110-Therapeutic exercises, 97530- Therapeutic activity, [id number]- Neuromuscular re-education, 97535- Self Care, 09811- Manual therapy, [id number]- Electrical stimulation (unattended), [phone]- Electrical stimulation (manual), Joint mobilization, Joint manipulation, Spinal manipulation, Spinal mobilization, Moist heat, and Biofeedback.  PLAN FOR NEXT SESSION: cervical spine mobility/ROM, assess cervical strength/endurance, manual therapy as needed, update HEP as appropriate    Huntley Dec R. Ilsa Iha, PT, DPT 12/27/23, 2:29 PM  [facility] [address] [place], [place] [address] P: [phone] I F: [phone]

## 2023-12-29 ENCOUNTER — Telehealth: Admitting: Physician Assistant

## 2023-12-29 ENCOUNTER — Ambulatory Visit: Admitting: Physical Therapy

## 2023-12-29 ENCOUNTER — Encounter: Payer: Self-pay | Admitting: Physical Therapy

## 2023-12-29 DIAGNOSIS — J069 Acute upper respiratory infection, unspecified: Secondary | ICD-10-CM

## 2023-12-29 DIAGNOSIS — J9801 Acute bronchospasm: Secondary | ICD-10-CM | POA: Diagnosis not present

## 2023-12-29 DIAGNOSIS — M542 Cervicalgia: Secondary | ICD-10-CM

## 2023-12-29 MED ORDER — ALBUTEROL SULFATE HFA 108 (90 BASE) MCG/ACT IN AERS: 2.0000 | INHALATION_SPRAY | Freq: Four times a day (QID) | RESPIRATORY_TRACT | 0 refills | Status: AC | PRN

## 2023-12-29 MED ORDER — BENZONATATE 100 MG PO CAPS
100.0000 mg | ORAL_CAPSULE | Freq: Three times a day (TID) | ORAL | 0 refills | Status: DC | PRN
Start: 2023-12-29 — End: 2024-03-27

## 2023-12-29 NOTE — Progress Notes (Signed)
 Virtual Visit Consent   TRYSTEN BERTI, you are scheduled for a virtual visit with a  provider today. Just as with appointments in the office, your consent must be obtained to participate. Your consent will be active for this visit and any virtual visit you may have with one of our providers in the next 365 days. If you have a MyChart account, a copy of this consent can be sent to you electronically.  As this is a virtual visit, video technology does not allow for your provider to perform a traditional examination. This may limit your provider's ability to fully assess your condition. If your provider identifies any concerns that need to be evaluated in person or the need to arrange testing (such as labs, EKG, etc.), we will make arrangements to do so. Although advances in technology are sophisticated, we cannot ensure that it will always work on either your end or our end. If the connection with a video visit is poor, the visit may have to be switched to a telephone visit. With either a video or telephone visit, we are not always able to ensure that we have a secure connection.  By engaging in this virtual visit, you consent to the provision of healthcare and authorize for your insurance to be billed (if applicable) for the services provided during this visit. Depending on your insurance coverage, you may receive a charge related to this service.  I need to obtain your verbal consent now. Are you willing to proceed with your visit today? SNEHA WILLIG has provided verbal consent on 12/29/2023 for a virtual visit (video or telephone). Gilberto Better, [place]  Date: 12/29/2023 5:16 PM   Virtual Visit via Video Note   I, Gilberto Better, connected with  GENEVRA ORNE  ([id number], 1956-01-09) on 12/29/23 at  5:00 PM EDT by a video-enabled telemedicine application and verified that I am speaking with the correct person using two identifiers.  Location: Patient: Virtual Visit Location Patient:  Home Provider: Virtual Visit Location Provider: Home Office   I discussed the limitations of evaluation and management by telemedicine and the availability of in person appointments. The patient expressed understanding and agreed to proceed.    History of Present Illness: RAYLA PEMBER is a 68 y.o. who identifies as a female who was assigned female at birth, and is being seen today for cough.  HPI: 68y/o F presents to video telehealth visit for c/o  cough x 1 week. Worse at night. Tested negative for covid earlier today. Denies fever. Feeling of chest tightness. Has been taking otc Mucinex and Xyzal without any improvement.   Cough    Problems:  Patient Active Problem List   Diagnosis Date Noted   REM behavioral disorder 03/14/2023   Excessive daytime sleepiness 03/14/2023   Crohn's disease of large intestine without complication (HCC) 09/30/2022   Convulsions, unspecified convulsion type (HCC) 09/24/2022   Encounter for current long-term use of anticoagulants 09/24/2022   MDD (major depressive disorder), recurrent episode, mild (HCC) 08/18/2022   Restless leg syndrome 08/18/2022   Pulmonary embolism (HCC) 07/16/2022   Pain in limb 11/10/2021   CAD (coronary artery disease) 08/06/2021   Glaucoma 02/23/2021   Other long term (current) drug therapy 01/22/2021   Varicose veins of both lower extremities with pain 07/19/2018   Arthritis of right hand 12/21/2017   Major depression in remission (HCC) 12/21/2017   Fibrocystic breast changes 06/22/2017   Osteopenia 10/18/2016   Atherosclerosis of aorta (HCC) 12/19/2015  Vitreous degeneration 05/21/2015   Glaucoma associated with chamber angle anomalies 05/21/2015   H/O malignant neoplasm of breast 05/21/2015   Solitary pulmonary nodule 11/07/2012   Cognitive deficits as late effect of cerebrovascular disease 01/20/2010   CVA, old, hemiparesis (HCC) 04/28/2009   Acquired hypothyroidism 06/12/2008   HLD (hyperlipidemia) 01/18/2007    Primary hypertension 01/18/2007    Allergies: No Known Allergies Medications:  Current Outpatient Medications:    benzonatate (TESSALON) 100 MG capsule, Take 1-2 capsules (100-200 mg total) by mouth 3 (three) times daily as needed for cough., Disp: 30 capsule, Rfl: 0   albuterol (VENTOLIN HFA) 108 (90 Base) MCG/ACT inhaler, Inhale 2 puffs into the lungs every 6 (six) hours as needed for wheezing or shortness of breath (chest tightness)., Disp: 8 g, Rfl: 0   brimonidine (ALPHAGAN) 0.2 % ophthalmic solution, Place 1 drop into both eyes in the morning and at bedtime., Disp: , Rfl:    ELIQUIS 5 MG TABS tablet, TAKE ONE TABLET BY MOUTH TWICE A DAY, Disp: 180 tablet, Rfl: 3   gabapentin (NEURONTIN) 300 MG capsule, 1 po qHS x 4 days, then bid x 4 days, then tid, Disp: , Rfl:    latanoprost (XALATAN) 0.005 % ophthalmic solution, Place 1 drop into both eyes at bedtime. , Disp: , Rfl:    levothyroxine (SYNTHROID) 50 MCG tablet, TAKE ONE TABLET BY MOUTH BEFORE BREAKFAST FOR 5 DAYS, AND TAKE TWO  TABLETS BEFORE BREAKFAST FOR 2 DAYS, Disp: 110 tablet, Rfl: 1   losartan-hydrochlorothiazide (HYZAAR) 50-12.5 MG tablet, Take 1 tablet by mouth daily., Disp: 90 tablet, Rfl: 1   meloxicam (MOBIC) 15 MG tablet, Take 0.5-1 tablets (7.5-15 mg total) by mouth daily., Disp: 30 tablet, Rfl: 0   metaxalone (SKELAXIN) 800 MG tablet, Take 0.5 tablets (400 mg total) by mouth 2 (two) times daily as needed for muscle spasms., Disp: 10 tablet, Rfl: 0   Multiple Vitamin (MULTIVITAMIN) tablet, Take 1 tablet by mouth daily., Disp: , Rfl:    ondansetron (ZOFRAN) 4 MG tablet, Take 1 tablet (4 mg total) by mouth every 8 (eight) hours as needed for nausea or vomiting., Disp: 30 tablet, Rfl: 0   pantoprazole (PROTONIX) 40 MG tablet, Take 1 tablet (40 mg total) by mouth 2 (two) times daily before a meal. (Patient not taking: Reported on 12/27/2023), Disp: 60 tablet, Rfl: 0   rosuvastatin (CRESTOR) 40 MG tablet, Take 1 tablet (40 mg total) by  mouth daily., Disp: 90 tablet, Rfl: 3   sertraline (ZOLOFT) 50 MG tablet, Take 1 tablet by mouth once daily, Disp: 90 tablet, Rfl: 0   vedolizumab (ENTYVIO) 300 MG injection, Induction Entyvio 300mg  at week 0,2, and 6 and then maintenance does every 8 weeks, Disp: 1 each, Rfl: 12  Observations/Objective: Patient is well-developed, well-nourished in no acute distress.  Resting comfortably  at home.  Head is normocephalic, atraumatic.  No labored breathing.  Speech is clear and coherent with logical content.  Patient is alert and oriented at baseline.    Assessment and Plan: 1. URI with cough and congestion (Primary) - benzonatate (TESSALON) 100 MG capsule; Take 1-2 capsules (100-200 mg total) by mouth 3 (three) times daily as needed for cough.  Dispense: 30 capsule; Refill: 0  2. Bronchospasm - albuterol (VENTOLIN HFA) 108 (90 Base) MCG/ACT inhaler; Inhale 2 puffs into the lungs every 6 (six) hours as needed for wheezing or shortness of breath (chest tightness).  Dispense: 8 g; Refill: 0  Stay well hydrated Start medicines, Albuterol  inhaler and Tessalon as prescribed. Continue with oral anti-histamine as already taking. Continue to watch for worsening symptoms. Schedule a virtual appointment if symptoms don't improve.   Pt verbalized understanding and in agreement.    Follow Up Instructions: I discussed the assessment and treatment plan with the patient. The patient was provided an opportunity to ask questions and all were answered. The patient agreed with the plan and demonstrated an understanding of the instructions.  A copy of instructions were sent to the patient via MyChart unless otherwise noted below.   Patient has requested to receive PHI (AVS, Work Notes, etc) pertaining to this video visit through e-mail as they are currently without active MyChart. They have voiced understand that email is not considered secure and their health information could be viewed by someone other  than the patient.   The patient was advised to call back or seek an in-person evaluation if the symptoms worsen or if the condition fails to improve as anticipated.    Gilberto Better, PA-C

## 2023-12-29 NOTE — Patient Instructions (Signed)
 Angela Burke, thank you for joining Gilberto Better, PA-C for today's virtual visit.  While this provider is not your primary care provider (PCP), if your PCP is located in our provider database this encounter information will be shared with them immediately following your visit.   A [facility] MyChart account gives you access to today's visit and all your visits, tests, and labs performed at [facility] " click here if you don't have a [facility] MyChart account or go to [email or web address]  Consent: (Patient) Carolyn Campbell provided verbal consent for this virtual visit at the beginning of the encounter.  Current Medications:  Current Outpatient Medications:    benzonatate (TESSALON) 100 MG capsule, Take 1-2 capsules (100-200 mg total) by mouth 3 (three) times daily as needed for cough., Disp: 30 capsule, Rfl: 0   albuterol (VENTOLIN HFA) 108 (90 Base) MCG/ACT inhaler, Inhale 2 puffs into the lungs every 6 (six) hours as needed for wheezing or shortness of breath (chest tightness)., Disp: 8 g, Rfl: 0   brimonidine (ALPHAGAN) 0.2 % ophthalmic solution, Place 1 drop into both eyes in the morning and at bedtime., Disp: , Rfl:    ELIQUIS 5 MG TABS tablet, TAKE ONE TABLET BY MOUTH TWICE A DAY, Disp: 180 tablet, Rfl: 3   gabapentin (NEURONTIN) 300 MG capsule, 1 po qHS x 4 days, then bid x 4 days, then tid, Disp: , Rfl:    latanoprost (XALATAN) 0.005 % ophthalmic solution, Place 1 drop into both eyes at bedtime. , Disp: , Rfl:    levothyroxine (SYNTHROID) 50 MCG tablet, TAKE ONE TABLET BY MOUTH BEFORE BREAKFAST FOR 5 DAYS, AND TAKE TWO  TABLETS BEFORE BREAKFAST FOR 2 DAYS, Disp: 110 tablet, Rfl: 1   losartan-hydrochlorothiazide (HYZAAR) 50-12.5 MG tablet, Take 1 tablet by mouth daily., Disp: 90 tablet, Rfl: 1   meloxicam (MOBIC) 15 MG tablet, Take 0.5-1 tablets (7.5-15 mg total) by mouth daily., Disp: 30 tablet, Rfl: 0   metaxalone (SKELAXIN) 800 MG tablet, Take 0.5 tablets (400 mg  total) by mouth 2 (two) times daily as needed for muscle spasms., Disp: 10 tablet, Rfl: 0   Multiple Vitamin (MULTIVITAMIN) tablet, Take 1 tablet by mouth daily., Disp: , Rfl:    ondansetron (ZOFRAN) 4 MG tablet, Take 1 tablet (4 mg total) by mouth every 8 (eight) hours as needed for nausea or vomiting., Disp: 30 tablet, Rfl: 0   pantoprazole (PROTONIX) 40 MG tablet, Take 1 tablet (40 mg total) by mouth 2 (two) times daily before a meal. (Patient not taking: Reported on 12/27/2023), Disp: 60 tablet, Rfl: 0   rosuvastatin (CRESTOR) 40 MG tablet, Take 1 tablet (40 mg total) by mouth daily., Disp: 90 tablet, Rfl: 3   sertraline (ZOLOFT) 50 MG tablet, Take 1 tablet by mouth once daily, Disp: 90 tablet, Rfl: 0   vedolizumab (ENTYVIO) 300 MG injection, Induction Entyvio 300mg  at week 0,2, and 6 and then maintenance does every 8 weeks, Disp: 1 each, Rfl: 12   Medications ordered in this encounter:  Meds ordered this encounter  Medications   benzonatate (TESSALON) 100 MG capsule    Sig: Take 1-2 capsules (100-200 mg total) by mouth 3 (three) times daily as needed for cough.    Dispense:  30 capsule    Refill:  0    Supervising Provider:   Merrilee Jansky [[id number]]   albuterol (VENTOLIN HFA) 108 (90 Base) MCG/ACT inhaler    Sig: Inhale 2 puffs into the lungs every 6 (six)  hours as needed for wheezing or shortness of breath (chest tightness).    Dispense:  8 g    Refill:  0    Supervising Provider:   Merrilee Jansky [[id number]]     *If you need refills on other medications prior to your next appointment, please contact your pharmacy*  Follow-Up: Call back or seek an in-person evaluation if the symptoms worsen or if the condition fails to improve as anticipated.  [facility] [phone]  Other Instructions Stay well hydrated Start medicines, Albuterol inhaler and Tessalon as prescribed. Continue with oral anti-histamine as already taking. Continue to watch for worsening  symptoms. Schedule a virtual appointment if symptoms don't improve.     If you have been instructed to have an in-person evaluation today at a local Urgent Care facility, please use the link below. It will take you to a list of all of our available [facility], including address, phone number and hours of operation. Please do not delay care.  [facility]  If you or a family member do not have a primary care provider, use the link below to schedule a visit and establish care. When you choose a [facility] primary care physician or advanced practice provider, you gain a long-term partner in health. Find a Primary Care Provider  Learn more about [facility]'s in-office and virtual care options: [facility] Get [facility] Now

## 2023-12-29 NOTE — Therapy (Signed)
 OUTPATIENT PHYSICAL THERAPY TREATMENT / DISCHARGE SUMMARY Dates of reporting from 11/16/2023 to 12/29/2023   Patient Name: Carolyn Campbell MRN: [id number] DOB:Sep 15, 1956, 68 y.o., female Today's Date: 12/29/2023  END OF SESSION:  PT End of Session - 12/29/23 1445     Visit Number 6    Number of Visits 17    Date for PT Re-Evaluation 02/08/24    Authorization Type [employer] - reporting period from 11/16/23    Authorization Time Period [id number] 2/26-4/23 for 16 Pt visits    Authorization - Visit Number 6    Authorization - Number of Visits 16    Progress Note Due on Visit 10    PT Start Time 1436    PT Stop Time 1514    PT Time Calculation (min) 38 min    Activity Tolerance Patient tolerated treatment well    Behavior During Therapy [facility] for tasks assessed/performed                Past Medical History:  Diagnosis Date   Abdominal pain, epigastric    Acute metabolic encephalopathy 07/22/2022   Allergic rhinitis    Arthritis    Asthma    Breast cancer (HCC) 2006   LT LUMPECTOMY   Cataract    Clotting disorder (HCC)    Cognitive deficits as late effect of cerebrovascular disease    Convulsions, unspecified convulsion type (HCC) 09/24/2022   COPD (chronic obstructive pulmonary disease) (HCC)    COVID-19 virus infection 04/2021   Crohn's disease (HCC) 05/21/2015   FOLLOWED BY GI   Crohn's disease of both small and large intestine with rectal bleeding (HCC) 12/04/2014   Depression    Currently taking zoloft.   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 05/2021 Echo: EF 60-65%, no rwma, nl RV fxn.   History of kidney stones    Hypercholesterolemia 01/18/2007   Hyperlipidemia    Hypertension    Hypothyroidism    Low  back pain radiating to left leg 06/17/2021   Nonobstructive CAD (coronary artery disease)    a. 10/2012 Cath: diffuse minor irregs-->med rx; b. 08/2021 Cor CTA: Ca2+ = 55.6 (77th%'ile), LAD 25p, LCX <25p. No signif non-cardiac findings.   Occlusion, cerebral artery    NOS w/infarction   Osteoporosis    Overweight (BMI 25.0-29.9) 07/17/2022   Personal history of radiation therapy 2006   BREAST CA   Pulmonary embolism (HCC)    a.04/2021 CTA Chest: Small filling defects are noted in upper and lower lobe branches of L PA-->acute PE-->eliquis. *PE occurred in context of COVID infxn.   Stroke [facility])    Ulcer    Past Surgical History:  Procedure Laterality Date   BREAST BIOPSY Left 2006   POS   BREAST LUMPECTOMY Left 09/20/2004   positive   BREAST SURGERY Left    malignant biopsy   CARDIAC CATHETERIZATION     COLONOSCOPY  2015   COLONOSCOPY WITH PROPOFOL N/A 06/08/2017   Procedure: COLONOSCOPY WITH PROPOFOL;  Surgeon: Toney Reil, MD;  Location: [facility];  Service: Gastroenterology;  Laterality: N/A;   COLONOSCOPY WITH PROPOFOL N/A 03/08/2019   Procedure: COLONOSCOPY WITH PROPOFOL;  Surgeon: Toney Reil, MD;  Location: [facility] ENDOSCOPY;  Service:  Gastroenterology;  Laterality: N/A;   COLONOSCOPY WITH PROPOFOL N/A 07/16/2020   Procedure: COLONOSCOPY WITH PROPOFOL;  Surgeon: Toney Reil, MD;  Location: [facility] ENDOSCOPY;  Service: Gastroenterology;  Laterality: N/A;   COLONOSCOPY WITH PROPOFOL N/A 09/30/2022   Procedure: COLONOSCOPY WITH PROPOFOL;  Surgeon: Toney Reil, MD;  Location: [facility] ENDOSCOPY;  Service: Gastroenterology;  Laterality: N/A;   ESOPHAGOGASTRODUODENOSCOPY  2015   ESOPHAGOGASTRODUODENOSCOPY (EGD) WITH PROPOFOL N/A 06/08/2017   Procedure: ESOPHAGOGASTRODUODENOSCOPY (EGD) WITH PROPOFOL;  Surgeon: Toney Reil, MD;  Location: [facility];  Service: Gastroenterology;  Laterality: N/A;   ESOPHAGOGASTRODUODENOSCOPY (EGD) WITH  PROPOFOL N/A 03/08/2019   Procedure: ESOPHAGOGASTRODUODENOSCOPY (EGD) WITH PROPOFOL;  Surgeon: Toney Reil, MD;  Location: [facility] ENDOSCOPY;  Service: Gastroenterology;  Laterality: N/A;   EYE SURGERY     FINGER SURGERY     Right small finger   FRACTURE SURGERY     GIVENS CAPSULE STUDY  2015   TUBAL LIGATION     Patient Active Problem List   Diagnosis Date Noted   REM behavioral disorder 03/14/2023   Excessive daytime sleepiness 03/14/2023   Crohn's disease of large intestine without complication (HCC) 09/30/2022   Convulsions, unspecified convulsion type (HCC) 09/24/2022   Encounter for current long-term use of anticoagulants 09/24/2022   MDD (major depressive disorder), recurrent episode, mild (HCC) 08/18/2022   Restless leg syndrome 08/18/2022   Pulmonary embolism (HCC) 07/16/2022   Pain in limb 11/10/2021   CAD (coronary artery disease) 08/06/2021   Glaucoma 02/23/2021   Other long term (current) drug therapy 01/22/2021   Varicose veins of both lower extremities with pain 07/19/2018   Arthritis of right hand 12/21/2017   Major depression in remission (HCC) 12/21/2017   Fibrocystic breast changes 06/22/2017   Osteopenia 10/18/2016   Atherosclerosis of aorta (HCC) 12/19/2015   Vitreous degeneration 05/21/2015   Glaucoma associated with chamber angle anomalies 05/21/2015   H/O malignant neoplasm of breast 05/21/2015   Solitary pulmonary nodule 11/07/2012   Cognitive deficits as late effect of cerebrovascular disease 01/20/2010   CVA, old, hemiparesis (HCC) 04/28/2009   Acquired hypothyroidism 06/12/2008   HLD (hyperlipidemia) 01/18/2007   Primary hypertension 01/18/2007    PCP: Danelle Berry, PA-C  REFERRING PROVIDER: Danelle Berry, PA-C  REFERRING DIAG: Neck pain on right side  Rationale for Evaluation and Treatment: Rehabilitation  THERAPY DIAG:  Cervicalgia  ONSET DATE: Over a month ago, insidious onset neck pain  SUBJECTIVE:  PERTINENT HISTORY:  Patient is a 68 y.o. female who presents to outpatient physical therapy with a referral for medical diagnosis of neck pain on right side. This patient's chief complaints consist of insidious onset acute neck pain on the right side of her neck from the base of her skull down the the top of her shoulder, leading to the following functional deficits: difficulty and pain with tasks moving her head in all directions (particularly turning), driving, exercising, and sewing. Relevant past medical history and comorbidities include asthma, hx of breast cancer, clotting disorder (HCC), COPD, depression, glaucoma, convulsions (unspecified convulsion type), osteoporosis, nonobstructive coronary artery disease, hx of pulmonary embolism (HCC).     Exercise history: likes to take exercise classes at the gym, but hasn't been for about 6 months due to health issues.   SUBJECTIVE STATEMENT: Patient states her neck is doing well but she has a cough after she went outside to sweep of her porch. This was a week ago. Her chest has been hurting and the cough is not getting better. She had a negative COVID test. She has a visit with her doctor this evening at 5pm. She did some of her HEP today but not yesterday. She feels like she is getting the hang of what she is supposed to be doing, especially with the band. She has not tried going to the gym yet. She  might try going to the gym next week, but she has to start prepping for colonoscopy on 4/18.   PAIN:  NPRS: 0/10   PRECAUTIONS: None  RED FLAGS: Patient denies hx of seizures, heart problems, diabetes, unexplained weight loss, unexplained changes in bowel or bladder problems, unexplained stumbling or dropping things, and spinal surgery    Patient has hx of breast cancer, blood clots in lungs, and  osteoporosis.   PATIENT GOALS: Wants her neck to be better, wants to be able to "do what she wants to do"   OBJECTIVE:   SPINE MOTION   CERVICAL SPINE AROM *Indicates pain Flexion: 50 Extension: 50 Side Flexion:        R 45       L 37 Rotation:  R 74 L 65 Protrusion: 3.5 cm Retraction: 1.5 cm  TREATMENT:                                                                                                                        Therapeutic exercise: therapeutic exercises that incorporate ONE parameter at one or more areas of the body to centralize symptoms, develop strength and endurance, range of motion, and flexibility required for successful completion of functional activities.  Cervical spine AROM throughout session to check asterisk (no pain).    Standing cervical thoracic extension/BUE flexion and serratus anterior activation, lat stretch, with foam roller up wall.  1x20  Seated cervical spine rotation SNAG with pillow case 2x10 each side with 5 second hold Did not need any cuing  Seated UT stretch with contralateral hand reaching towards floor  and ipsilateral hand gently providing OP 3x30 seconds each side Cuing for how long to hold stretch  AROM cervical spine measurement to assess progress  Education on HEP including handout   Standing wall scapular protraction/retraction with hands on wall 1x20 to improve motor control kinesthetic awareness of this movement.  Needed a lot of cuing at first to move scapulae instead of elbow  Standing wall walks up/down wall with B UE with isometric ER using TB around wrists.  3x10 with YTB with moderate cuing for technique Improved scapular protraction "pushed in" is a helpful cue for her  Pt required multimodal cuing for proper technique and to facilitate improved neuromuscular control, strength, range of motion, and functional ability resulting in improved performance and form.   PATIENT EDUCATION:  Education details:  Education on diagnosis/prognosis, plan of care, anatomy/physiology of current condition, HEP purpose/form.  Person educated: Patient Education method: Explanation, Demonstration, Verbal cues, and Handouts Education comprehension: verbalized understanding and needs further education  HOME EXERCISE PROGRAM: Access Code: [email or web address] URL: [email or web address] Date: 12/29/2023 Prepared by: Norton Blizzard  Exercises - Seated Assisted Cervical Rotation with Towel  - 1 x daily - 7 x weekly - 2 sets - 10 reps - 2-3 second hold - Seated Upper Trapezius Stretch  - 3 x daily - 2 reps - 30 seconds hold - Scapular Protraction at Wall  - 3 x weekly - 1 sets - 20 reps - Forearm Walks on Wall with Resistance Band  - 3 x weekly - 3 sets - 10 reps  ASSESSMENT:  CLINICAL IMPRESSION: Patient has attended 6 physical therapy sessions since starting current episode of care on 11/16/2023. She has met all of her long and short term goals and demonstrates good understanding of long term HEP. She is now discharged from PT to self management with long term HEP.   OBJECTIVE IMPAIRMENTS: decreased activity tolerance, decreased mobility, decreased ROM, hypomobility, and pain.   ACTIVITY LIMITATIONS: carrying, lifting, and sleeping  PARTICIPATION LIMITATIONS: driving and exercise (any task that involves rotating her head)  PERSONAL FACTORS: Past/current experiences and 3+ comorbidities: asthma, hx of breast cancer, clotting disorder (HCC), COPD, depression, glaucoma, convulsions (unspecified convulsion type), osteoporosis, nonobstructive coronary artery disease, hx of pulmonary embolism (HCC)  are also affecting patient's functional outcome.   REHAB POTENTIAL: Good  CLINICAL DECISION MAKING: Stable/uncomplicated  EVALUATION COMPLEXITY: Low   GOALS: Goals reviewed with patient? No  SHORT TERM GOALS: Target date: 11/30/2023  Patient will be independent with initial home exercise program for  self-management of symptoms. Baseline: Initial HEP provided at IE (11/16/23);  Goal status: MET  Patient will demonstrate improvement in Patient Specific Functional Scale (PSFS) of equal or greater than 3 points to reflect clinically significant improvement in patient's most valued functional activities.. Baseline: 3/10 at visit #1 (11/16/23); 10/10 at visit #2 (12/14/2023); Goal status: MET   3. Patient will report improvement in NPRS of equal or greater than 2 points during functional activities to improve their abilitly to complete community, work and/or recreational activities with less limitation. Baseline: 6/10 at worst (11/16/23); up to 3-4/10 in the last two weeks (12/14/2023);  Goal status: MET   LONG TERM GOALS: Target date: 02/08/2024  Patient will be independent with a long-term home exercise program for self-management of symptoms.  Baseline: Initial HEP provided at IE (11/16/23); participating in initial HEP (12/14/2023); participating well (12/29/2023);  Goal status: MET  2.  Patient will demonstrate improved cervical AROM to greater than or equal to 60 degrees for  rotation and 30 degrees for flexion/extension/lateral flexion without pain to improve ability to drive and exercise with less difficulty.   Baseline: 18 degrees flexion, 19 degrees extension, 17 degrees R lateral flexion, 16 degrees L lateral flexion, 18 degrees R rotation, 23 degrees L rotation - pain with all ROM (11/16/23); met except 25 degrees left flexion and 45 degrees left rotation both with concordant symptoms (12/14/2023);  Goal status: MET  3.  Patient will demonstrate improvement in Patient Specific Functional Scale (PSFS) of equal or greater than 8/10 points to reflect clinically significant improvement in patient's most valued functional activities. Baseline: 3/10 at visit #1 (11/16/23); 10/10 at visit #2 (12/14/2023); Goal status: MET  4.  Patient will report NPRS equal or less than 3/10 during functional  activities during the last 2 weeks to improve their abilitly to complete community, work and/or recreational activities with less limitation. Baseline: 6/10 at worst (11/16/23); up to 3-4/10 in the last two weeks (12/14/2023); 0/10 in the last week (12/29/2023);  Goal status: MET  PLAN:  PT FREQUENCY: 1-2x/week  PT DURATION: 8-12 weeks  PLANNED INTERVENTIONS: 97164- PT Re-evaluation, 97110-Therapeutic exercises, 97530- Therapeutic activity, 97112- Neuromuscular re-education, 97535- Self Care, 16109- Manual therapy, G0283- Electrical stimulation (unattended), [phone]- Electrical stimulation (manual), Joint mobilization, Joint manipulation, Spinal manipulation, Spinal mobilization, Moist heat, and Biofeedback.  PLAN FOR NEXT SESSION: Patient is now discharged from PT   Hayly Litsey R. Ilsa Iha, PT, DPT 12/29/23, 3:13 PM  [facility] [address] [place], [place] [address] P: [phone] I F: [phone]

## 2024-01-02 ENCOUNTER — Ambulatory Visit: Payer: Medicare Other | Admitting: Physical Therapy

## 2024-01-04 ENCOUNTER — Ambulatory Visit: Payer: Medicare Other | Admitting: Physical Therapy

## 2024-01-09 ENCOUNTER — Encounter: Payer: Medicare Other | Admitting: Physical Therapy

## 2024-01-09 ENCOUNTER — Ambulatory Visit: Payer: Self-pay | Admitting: Anesthesiology

## 2024-01-09 ENCOUNTER — Encounter
Admission: RE | Disposition: A | Discharge status: Home / Self Care | Payer: Self-pay | Source: Home / Self Care | Attending: Gastroenterology

## 2024-01-09 ENCOUNTER — Encounter: Payer: Self-pay | Admitting: Gastroenterology

## 2024-01-09 ENCOUNTER — Other Ambulatory Visit: Payer: Self-pay

## 2024-01-09 ENCOUNTER — Telehealth: Payer: Self-pay

## 2024-01-09 ENCOUNTER — Ambulatory Visit
Admission: RE | Admit: 2024-01-09 | Discharge: 2024-01-09 | Disposition: A | Discharge status: Home / Self Care | Attending: Gastroenterology | Admitting: Gastroenterology

## 2024-01-09 DIAGNOSIS — J4489 Other specified chronic obstructive pulmonary disease: Secondary | ICD-10-CM | POA: Diagnosis not present

## 2024-01-09 DIAGNOSIS — I10 Essential (primary) hypertension: Secondary | ICD-10-CM | POA: Diagnosis not present

## 2024-01-09 DIAGNOSIS — K5190 Ulcerative colitis, unspecified, without complications: Secondary | ICD-10-CM | POA: Diagnosis not present

## 2024-01-09 DIAGNOSIS — E039 Hypothyroidism, unspecified: Secondary | ICD-10-CM | POA: Insufficient documentation

## 2024-01-09 DIAGNOSIS — I2510 Atherosclerotic heart disease of native coronary artery without angina pectoris: Secondary | ICD-10-CM | POA: Diagnosis not present

## 2024-01-09 DIAGNOSIS — K5010 Crohn's disease of large intestine without complications: Secondary | ICD-10-CM | POA: Insufficient documentation

## 2024-01-09 DIAGNOSIS — Z79890 Hormone replacement therapy (postmenopausal): Secondary | ICD-10-CM | POA: Diagnosis not present

## 2024-01-09 HISTORY — PX: COLONOSCOPY: SHX5424

## 2024-01-09 SURGERY — COLONOSCOPY
Anesthesia: General | Site: Rectum

## 2024-01-09 MED ORDER — LIDOCAINE HCL (PF) 2 % IJ SOLN
INTRAMUSCULAR | Status: AC
  Filled 2024-01-09: qty 5

## 2024-01-09 MED ORDER — LIDOCAINE HCL (CARDIAC) PF 100 MG/5ML IV SOSY
PREFILLED_SYRINGE | INTRAVENOUS | Status: DC | PRN
  Administered 2024-01-09: 60 mg via INTRAVENOUS

## 2024-01-09 MED ORDER — PROPOFOL 10 MG/ML IV BOLUS
INTRAVENOUS | Status: DC | PRN
  Administered 2024-01-09: 50 ug/kg/min via INTRAVENOUS

## 2024-01-09 MED ORDER — EPHEDRINE SULFATE (PRESSORS) 50 MG/ML IJ SOLN
INTRAMUSCULAR | Status: DC | PRN
  Administered 2024-01-09: 10 mg via INTRAVENOUS

## 2024-01-09 MED ORDER — DEXMEDETOMIDINE HCL IN NACL 80 MCG/20ML IV SOLN
INTRAVENOUS | Status: AC
  Filled 2024-01-09: qty 20

## 2024-01-09 MED ORDER — SODIUM CHLORIDE 0.9 % IV SOLN: INTRAVENOUS | Status: DC

## 2024-01-09 MED ORDER — PROPOFOL 1000 MG/100ML IV EMUL
INTRAVENOUS | Status: AC
  Filled 2024-01-09: qty 100

## 2024-01-09 MED ORDER — DEXMEDETOMIDINE HCL IN NACL 80 MCG/20ML IV SOLN
INTRAVENOUS | Status: DC | PRN
  Administered 2024-01-09: 20 ug via INTRAVENOUS

## 2024-01-09 MED ORDER — STERILE WATER FOR IRRIGATION IR SOLN
Status: DC | PRN
  Administered 2024-01-09: 1000 mL

## 2024-01-09 MED ORDER — PROPOFOL 500 MG/50ML IV EMUL: INTRAVENOUS | Status: DC | PRN

## 2024-01-09 SURGICAL SUPPLY — 16 items
CLIP HMST 235XBRD CATH ROT (MISCELLANEOUS) IMPLANT
ELECT REM PT RETURN 9FT ADLT (ELECTROSURGICAL) IMPLANT
ELECTRODE REM PT RTRN 9FT ADLT (ELECTROSURGICAL) IMPLANT
FORCEPS BIOP RAD 4 LRG CAP 4 (CUTTING FORCEPS) IMPLANT
GAUZE SPONGE 4X4 12PLY STRL (GAUZE/BANDAGES/DRESSINGS) IMPLANT
GOWN CVR UNV OPN BCK APRN NK (MISCELLANEOUS) ×4 IMPLANT
INJECTOR VARIJECT VIN23 (MISCELLANEOUS) IMPLANT
KIT DEFENDO VALVE AND CONN (KITS) IMPLANT
KIT PRC NS LF DISP ENDO (KITS) ×2 IMPLANT
MANIFOLD NEPTUNE II (INSTRUMENTS) ×2 IMPLANT
MARKER SPOT ENDO TATTOO 5ML (MISCELLANEOUS) IMPLANT
PROBE APC STR FIRE (PROBE) IMPLANT
RETRIEVER NET ROTH 2.5X230 LF (MISCELLANEOUS) IMPLANT
TRAP ETRAP POLY (MISCELLANEOUS) IMPLANT
VARIJECT INJECTOR VIN23 (MISCELLANEOUS) IMPLANT
WATER STERILE IRR 250ML POUR (IV SOLUTION) ×2 IMPLANT

## 2024-01-09 NOTE — Telephone Encounter (Signed)
-----   Message from [facility] sent at 01/09/2024  8:15 AM EDT ----- Regarding: enyvio Let's apply to her insurance to increase her entyvio  to every 6 weeks Re: recent exacerbation of crohn's disease requiring prednisone  course and having break through symptoms   RV

## 2024-01-09 NOTE — Telephone Encounter (Signed)
 Submitted PA through [place] [facility]  Your Authorization Request Is Pending  Your request number is [id number]. Your request may require review by our clinical team. Also, if additional information is needed to make a determination, we will reach out to you via the contact information provided below. Please see below for details regarding your request.  Request Status Pending Request Number [id number] [facility] Drug Name Drug Code Injection, vedolizumab , IV, 1 mg J3380 Authorization Request Member InformationMember Information Full Name      REDELL BHANDARI Gender      Female Date of Birth      05/30/1956 Subscriber ID      [id number] Group ID      [id number] Relationship      Subscriber/Recipient Requesting ProviderRequesting Provider Provider DetailsProvider Details Provider First Name      [facility] Provider Last Name      [facility] Provider [place]      [id number] Provider NPI      [id number] Provider Address      [address], [place] [address] Provider Phone Number      [phone] Provider Fax Number      [phone] Provider Email      Provider Cell Phone      Point of ContactPoint of Contact Full Name      Odilia Bennett Phone Number      [phone] Fax Number      [phone] Email      Servicing ProviderServicing Provider/Pharmacy Provider/Pharmacy Details Facility Name      [facility] [place]      [id number] Network Status      In Network Facility Address      [address] [place], [place] [address] Request DetailsRequest Details Patient DetailsPatient Details Height of the Patient      60 in Weight of the Patient      67.1 Kg Patient Contact Number      [phone]   Service DetailsService Details Initial Diagnosis Date      05-2019 Anticipated Authorization Start Date      01-09-2024 Clinical DetailsClinical Details New to Therapy or Continuation Therapy       Continuation Therapy Disease State      Moderately to severely active Crohn's disease (CD) Primary ICD-10 Code      K50.10 - Crohn's disease of large intestine without complications Additional ICD-10 Code      Place of Service      Outpatient Facility Drug Class      Inflammatory Agents Drug Description      Entyvio  IV (INJ VEDOLIZUMAB  IV 1 MG)  Clinical DetailsClinical Status Does medical record documentation demonstrate one of the following?  The member has NOT experienced a positive clinical response to Entyvio  Per the Health Plan's clinical policy for the requested drug and disease state, this authorization may only be approved for up to 12 months, do you want to proceed?  Yes, I do want to proceed

## 2024-01-09 NOTE — Transfer of Care (Signed)
 Immediate Anesthesia Transfer of Care Note  Patient: Carolyn Campbell  Procedure(s) Performed: COLONOSCOPY (Rectum)  Patient Location: PACU  Anesthesia Type: General  Level of Consciousness: awake, alert  and patient cooperative  Airway and Oxygen Therapy: Patient Spontanous Breathing and Patient connected to supplemental oxygen  Post-op Assessment: Post-op Vital signs reviewed, Patient's Cardiovascular Status Stable, Respiratory Function Stable, Patent Airway and No signs of Nausea or vomiting  Post-op Vital Signs: Reviewed and stable  Complications: No notable events documented.

## 2024-01-09 NOTE — Op Note (Signed)
 [facility] Gastroenterology Patient Name: Carolyn Campbell Procedure Date: 01/09/2024 7:25 AM MRN: [id number] Account #: [id number] Date of Birth: December 12, 1955 Admit Type: Outpatient Age: 68 Room: [facility] OR ROOM 01 Gender: Female Note Status: Finalized Instrument Name: [id number] Procedure:             Colonoscopy Indications:           Last colonoscopy: January 2024, Follow-up of Crohn's                         disease of the colon, Disease activity assessment of                         Crohn's disease of the colon, Assess therapeutic                         response to therapy of Crohn's disease of the colon Providers:             Selena Daily MD, MD Referring MD:          Selena Daily MD, MD (Referring MD) Medicines:             General Anesthesia Complications:         No immediate complications. Estimated blood loss: None. Procedure:             Pre-Anesthesia Assessment:                        - Prior to the procedure, a History and Physical was                         performed, and patient medications and allergies were                         reviewed. The patient is competent. The risks and                         benefits of the procedure and the sedation options and                         risks were discussed with the patient. All questions                         were answered and informed consent was obtained.                         Patient identification and proposed procedure were                         verified by the physician, the nurse, the                         anesthesiologist, the anesthetist and the technician                         in the pre-procedure area in the procedure room in the                         endoscopy suite. Mental Status Examination: alert and  oriented. Airway Examination: normal oropharyngeal                         airway and neck mobility. Respiratory Examination:                          clear to auscultation. CV Examination: normal.                         Prophylactic Antibiotics: The patient does not require                         prophylactic antibiotics. Prior Anticoagulants: The                         patient has taken Eliquis  (apixaban ), last dose was 2                         days prior to procedure. ASA Grade Assessment: III - A                         patient with severe systemic disease. After reviewing                         the risks and benefits, the patient was deemed in                         satisfactory condition to undergo the procedure. The                         anesthesia plan was to use general anesthesia.                         Immediately prior to administration of medications,                         the patient was re-assessed for adequacy to receive                         sedatives. The heart rate, respiratory rate, oxygen                         saturations, blood pressure, adequacy of pulmonary                         ventilation, and response to care were monitored                         throughout the procedure. The physical status of the                         patient was re-assessed after the procedure.                        After obtaining informed consent, the colonoscope was                         passed under direct vision. Throughout the procedure,  the patient's blood pressure, pulse, and oxygen                         saturations were monitored continuously. The                         Colonoscope was introduced through the anus and                         advanced to the the terminal ileum, with                         identification of the appendiceal orifice and IC                         valve. The colonoscopy was performed without                         difficulty. The patient tolerated the procedure well.                         The quality of the bowel preparation was evaluated                          using the BBPS [facility] Bowel Preparation Scale) with                         scores of: Right Colon = 3, Transverse Colon = 3 and                         Left Colon = 3 (entire mucosa seen well with no                         residual staining, small fragments of stool or opaque                         liquid). The total BBPS score equals 9. The terminal                         ileum, ileocecal valve, appendiceal orifice, and                         rectum were photographed. Findings:      The perianal and digital rectal examinations were normal. Pertinent       negatives include normal sphincter tone and no palpable rectal lesions.      The terminal ileum appeared normal.      The entire examined colon appeared normal. Biopsies were taken with a       cold forceps for histology.      The rectum appeared normal. Biopsies were taken with a cold forceps for       histology. Impression:            - The examined portion of the ileum was normal.                        - The entire examined colon is normal. Biopsied.                        -  The rectum is normal. Biopsied. Recommendation:        - Discharge patient to home (with spouse).                        - Resume previous diet today.                        - Continue present medications.                        - Await pathology results.                        - Return to my office as previously scheduled. Procedure Code(s):     --- Professional ---                        [phone], Colonoscopy, flexible; with biopsy, single or                         multiple Diagnosis Code(s):     --- Professional ---                        K50.10, Crohn's disease of large intestine without                         complications CPT copyright 2022 American Medical Association. All rights reserved. The codes documented in this report are preliminary and upon coder review may  be revised to meet current compliance requirements. Dr. Evia Hof Selena Daily MD, MD 01/09/2024 8:12:40 AM This report has been signed electronically. Number of Addenda: 0 Note Initiated On: 01/09/2024 7:25 AM Scope Withdrawal Time: 0 hours 9 minutes 40 seconds  Total Procedure Duration: 0 hours 12 minutes 32 seconds  Estimated Blood Loss:  Estimated blood loss: none.      [facility]

## 2024-01-09 NOTE — H&P (Signed)
 Karma Oz, MD [address]  [address]  [place], [place] [address]  Main: [phone]  Fax: [phone] Pager: [phone]  Primary Care Physician:  Adeline Hone, PA-C Primary Gastroenterologist:  Dr. Karma Oz  Pre-Procedure History & Physical: HPI:  Carolyn Campbell is a 68 y.o. female is here for an colonoscopy.   Past Medical History:  Diagnosis Date   Abdominal pain, epigastric    Acute metabolic encephalopathy 07/22/2022   Allergic rhinitis    Arthritis    Asthma    Breast cancer (HCC) 2006   LT LUMPECTOMY   Cataract    Clotting disorder (HCC)    Cognitive deficits as late effect of cerebrovascular disease    Convulsions, unspecified convulsion type (HCC) 09/24/2022   COPD (chronic obstructive pulmonary disease) (HCC)    COVID-19 virus infection 04/2021   Crohn's disease (HCC) 05/21/2015   FOLLOWED BY GI   Crohn's disease of both small and large intestine with rectal bleeding (HCC) 12/04/2014   Depression    Currently taking zoloft .   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 05/2021 Echo: EF 60-65%, no rwma, nl RV fxn.   History of kidney stones    Hypercholesterolemia 01/18/2007   Hyperlipidemia    Hypertension    Hypothyroidism    Low back pain radiating to left leg 06/17/2021   Nonobstructive CAD (coronary artery disease)    a. 10/2012 Cath: diffuse minor irregs-->med rx; b. 08/2021 Cor CTA: Ca2+ = 55.6 (77th%'ile), LAD 25p, LCX <25p. No signif non-cardiac findings.   Occlusion, cerebral artery    NOS w/infarction   Osteoporosis    Overweight (BMI 25.0-29.9) 07/17/2022   Personal history of radiation therapy 2006   BREAST CA   Pulmonary embolism (HCC)    a.04/2021 CTA Chest: Small filling defects  are noted in upper and lower lobe branches of L PA-->acute PE-->eliquis . *PE occurred in context of COVID infxn.   Stroke [facility])    Ulcer     Past Surgical History:  Procedure Laterality Date   BREAST BIOPSY Left 2006   POS   BREAST LUMPECTOMY Left 09/20/2004   positive   BREAST SURGERY Left    malignant biopsy   CARDIAC CATHETERIZATION     COLONOSCOPY  2015   COLONOSCOPY WITH PROPOFOL  N/A 06/08/2017   Procedure: COLONOSCOPY WITH PROPOFOL ;  Surgeon: Selena Daily, MD;  Location: [facility];  Service: Gastroenterology;  Laterality: N/A;   COLONOSCOPY WITH PROPOFOL  N/A 03/08/2019   Procedure: COLONOSCOPY WITH PROPOFOL ;  Surgeon: Selena Daily, MD;  Location: [facility] ENDOSCOPY;  Service: Gastroenterology;  Laterality: N/A;   COLONOSCOPY WITH PROPOFOL  N/A 07/16/2020   Procedure: COLONOSCOPY WITH PROPOFOL ;  Surgeon: Selena Daily, MD;  Location: [facility] ENDOSCOPY;  Service: Gastroenterology;  Laterality: N/A;   COLONOSCOPY WITH PROPOFOL  N/A 09/30/2022   Procedure: COLONOSCOPY WITH PROPOFOL ;  Surgeon: Selena Daily, MD;  Location: [facility] ENDOSCOPY;  Service: Gastroenterology;  Laterality: N/A;   ESOPHAGOGASTRODUODENOSCOPY  2015   ESOPHAGOGASTRODUODENOSCOPY (EGD) WITH PROPOFOL  N/A 06/08/2017   Procedure: ESOPHAGOGASTRODUODENOSCOPY (EGD) WITH PROPOFOL ;  Surgeon: Selena Daily, MD;  Location: [facility] SURGERY [facility];  Service: Gastroenterology;  Laterality: N/A;   ESOPHAGOGASTRODUODENOSCOPY (EGD) WITH PROPOFOL  N/A 03/08/2019   Procedure:  ESOPHAGOGASTRODUODENOSCOPY (EGD) WITH PROPOFOL ;  Surgeon: Selena Daily, MD;  Location: [facility] ENDOSCOPY;  Service: Gastroenterology;  Laterality: N/A;   EYE SURGERY     FINGER SURGERY     Right small finger   FRACTURE SURGERY     GIVENS CAPSULE STUDY  2015   TUBAL LIGATION      Prior to Admission medications   Medication Sig Start Date End Date Taking? Authorizing Provider  albuterol  (VENTOLIN  HFA) 108 (90 Base) MCG/ACT  inhaler Inhale 2 puffs into the lungs every 6 (six) hours as needed for wheezing or shortness of breath (chest tightness). 12/29/23  Yes Gandhi, Safal, PA-C  benzonatate  (TESSALON ) 100 MG capsule Take 1-2 capsules (100-200 mg total) by mouth 3 (three) times daily as needed for cough. 12/29/23  Yes Gandhi, Safal, PA-C  brimonidine (ALPHAGAN) 0.2 % ophthalmic solution Place 1 drop into both eyes in the morning and at bedtime.   Yes [provider]  ELIQUIS  5 MG TABS tablet TAKE ONE TABLET BY MOUTH TWICE A DAY 12/12/23  Yes Agrawal, Kavita, MD  gabapentin  (NEURONTIN ) 300 MG capsule 1 po qHS x 4 days, then bid x 4 days, then tid 09/05/23  Yes [provider]  latanoprost  (XALATAN ) 0.005 % ophthalmic solution Place 1 drop into both eyes at bedtime.  04/22/16  Yes [provider]  levothyroxine  (SYNTHROID ) 50 MCG tablet TAKE ONE TABLET BY MOUTH BEFORE BREAKFAST FOR 5 DAYS, AND TAKE TWO  TABLETS BEFORE BREAKFAST FOR 2 DAYS 12/12/23  Yes Tapia, Leisa, PA-C  losartan -hydrochlorothiazide  (HYZAAR) 50-12.5 MG tablet Take 1 tablet by mouth daily. 08/02/23  Yes Tapia, Leisa, PA-C  meloxicam  (MOBIC ) 15 MG tablet Take 0.5-1 tablets (7.5-15 mg total) by mouth daily. 10/28/23  Yes Tapia, Leisa, PA-C  metaxalone  (SKELAXIN ) 800 MG tablet Take 0.5 tablets (400 mg total) by mouth 2 (two) times daily as needed for muscle spasms. 10/05/23  Yes Wellington Half, NP  Multiple Vitamin (MULTIVITAMIN) tablet Take 1 tablet by mouth daily.   Yes [provider]  ondansetron  (ZOFRAN ) 4 MG tablet Take 1 tablet (4 mg total) by mouth every 8 (eight) hours as needed for nausea or vomiting. 11/28/23  Yes Romie Tay, Elson Halon, MD  rosuvastatin  (CRESTOR ) 40 MG tablet Take 1 tablet (40 mg total) by mouth daily. 03/30/23 03/24/24 Yes Tapia, Leisa, PA-C  sertraline  (ZOLOFT ) 50 MG tablet Take 1 tablet by mouth once daily 10/17/23  Yes Tapia, Leisa, PA-C  vedolizumab  (ENTYVIO ) 300 MG injection Induction Entyvio  300mg  at week  0,2, and 6 and then maintenance does every 8 weeks 10/11/22  Yes Jadaya Sommerfield, Elson Halon, MD  pantoprazole  (PROTONIX ) 40 MG tablet Take 1 tablet (40 mg total) by mouth 2 (two) times daily before a meal. Patient not taking: Reported on 12/27/2023 11/28/23   Selena Daily, MD    Allergies as of 12/07/2023   (No Known Allergies)    Family History  Problem Relation Age of Onset   Heart disease Mother    Heart attack Mother    Hypertension Mother    Varicose Veins Mother    Cerebrovascular Accident Father    Arthritis Father    Hypertension Father    Dementia Father    Breast cancer Sister    Cancer Sister    Breast cancer Sister 17   Cancer Sister    Cancer Sister    Hypertension Other    Diabetes Other    Heart attack Other 41   Mental illness Neg Hx  Social History   Socioeconomic History   Marital status: Married    Spouse name: javis   Number of children: 2   Years of education: Not on file   Highest education level: Some college, no degree  Occupational History   Not on file  Tobacco Use   Smoking status: Never   Smokeless tobacco: Never  Vaping Use   Vaping status: Never Used  Substance and Sexual Activity   Alcohol use: Never   Drug use: Never   Sexual activity: Not Currently    Partners: Male    Birth control/protection: Surgical  Other Topics Concern   Not on file  Social History Narrative   Married. One son and one daughter. She is disabled after she had breast cancer and strokes. 2 caffeinated beverages daily.   Social Drivers of Corporate investment banker Strain: Low Risk  (09/25/2023)   Overall Financial Resource Strain (CARDIA)    Difficulty of Paying Living Expenses: Not hard at all  Food Insecurity: No Food Insecurity (09/25/2023)   Hunger Vital Sign    Worried About Running Out of Food in the Last Year: Never true    Ran Out of Food in the Last Year: Never true  Transportation Needs: No Transportation Needs (09/25/2023)   PRAPARE -  Administrator, Civil Service (Medical): No    Lack of Transportation (Non-Medical): No  Physical Activity: Insufficiently Active (09/25/2023)   Exercise Vital Sign    Days of Exercise per Week: 1 day    Minutes of Exercise per Session: 30 min  Stress: No Stress Concern Present (09/25/2023)   [employer] of Occupational Health - Occupational Stress Questionnaire    Feeling of Stress : Not at all  Social Connections: Moderately Integrated (09/25/2023)   Social Connection and Isolation Panel [NHANES]    Frequency of Communication with Friends and Family: Once a week    Frequency of Social Gatherings with Friends and Family: Once a week    Attends Religious Services: More than 4 times per year    Active Member of [employer] or Organizations: Yes    Attends Engineer, structural: More than 4 times per year    Marital Status: Married  Catering manager Violence: Not At Risk (02/04/2023)   Humiliation, Afraid, Rape, and Kick questionnaire    Fear of Current or Ex-Partner: No    Emotionally Abused: No    Physically Abused: No    Sexually Abused: No    Review of Systems: See HPI, otherwise negative ROS  Physical Exam: BP (!) 151/72   Temp 98.5 F (36.9 C) (Temporal)   Ht 5' (1.524 m)   Wt 63.6 kg   SpO2 97%   BMI 27.38 kg/m  General:   Alert,  pleasant and cooperative in NAD Head:  Normocephalic and atraumatic. Neck:  Supple; no masses or thyromegaly. Lungs:  Clear throughout to auscultation.    Heart:  Regular rate and rhythm. Abdomen:  Soft, nontender and nondistended. Normal bowel sounds, without guarding, and without rebound.   Neurologic:  Alert and  oriented x4;  grossly normal neurologically.  Impression/Plan: Carolyn Campbell is here for a colonoscopy to be performed for crohn's colitis  Risks, benefits, limitations, and alternatives regarding  colonoscopy have been reviewed with the patient.  Questions have been answered.  All parties agreeable.   Ellis Guys, MD  01/09/2024, 7:45 AM

## 2024-01-10 ENCOUNTER — Other Ambulatory Visit

## 2024-01-10 ENCOUNTER — Ambulatory Visit: Admitting: Internal Medicine

## 2024-01-10 NOTE — Anesthesia Postprocedure Evaluation (Signed)
 Anesthesia Post Note  Patient: Carolyn Campbell  Procedure(s) Performed: COLONOSCOPY (Rectum)  Patient location during evaluation: PACU Anesthesia Type: General Level of consciousness: awake and alert Pain management: pain level controlled Vital Signs Assessment: post-procedure vital signs reviewed and stable Respiratory status: spontaneous breathing, nonlabored ventilation, respiratory function stable and patient connected to nasal cannula oxygen Cardiovascular status: blood pressure returned to baseline and stable Postop Assessment: no apparent nausea or vomiting Anesthetic complications: no   No notable events documented.   Last Vitals:  Vitals:   01/09/24 0821 01/09/24 0826  BP: (!) 91/52 (!) 101/54  Pulse: 80 77  Resp: 19 15  Temp:  (!) 36.3 C  SpO2: 95% 97%    Last Pain:  Vitals:   01/09/24 0826  TempSrc:   PainSc: 0-No pain                 Elihu Grumet Rylon Poitra

## 2024-01-10 NOTE — Telephone Encounter (Signed)
 Your Authorization Request Has Been Approved  Your authorization request number is [id number]. If you need to add a new specialty drug covered under the medical benefit, you will need to submit a new authorization request.  Authorization Status Approved Authorization Number [id number] Authorization Start Date 01-09-2024 Authorization End Date 01-07-2025 [facility] Drug Name Drug Code Authorization Status Injection, vedolizumab , IV, 1 mg J3380 Approved Authorization Request Member InformationMember Information Full Name      Carolyn Campbell Gender      Female Date of Birth      08/05/1956 Subscriber ID      [id number] Group ID      [id number] Relationship      Subscriber/Recipient Requesting ProviderRequesting Provider Provider DetailsProvider Details Provider First Name      [facility] Provider Last Name      [facility] Provider T[place]      [id number] Provider NPI      [id number] Provider Address      [address] [place], [place] [address] Provider Phone Number      [phone] Provider Fax Number      [phone] Provider Email      Provider Cell Phone      Point of ContactPoint of Contact Full Name      Odilia Bennett Phone Number      [phone] Fax Number      [phone] Email      Servicing ProviderServicing Provider/Pharmacy Provider/Pharmacy Details Facility Name      [facility] Facility [facility]      [id number] Network Status      In Network Facility Address      [address] [place], [place] [address] Request DetailsRequest Details Patient DetailsPatient Details Height of the Patient      60 in Weight of the Patient      67.1 Kg Patient Contact Number      [phone]   Service DetailsService Details Initial Diagnosis Date      05-2019 Authorization Start Date      01-09-2024 Clinical DetailsClinical Details New to Therapy or Continuation Therapy      Continuation Therapy Disease State       Moderately to severely active Crohn's disease (CD) Primary ICD-10 Code      K50.10 - Crohn's disease of large intestine without complications Additional ICD-10 Code      Place of Service      Outpatient Facility Drug Class      Inflammatory Agents Drug Description      Entyvio  IV (INJ VEDOLIZUMAB  IV 1 MG)  Clinical DetailsClinical Status Does medical record documentation demonstrate one of the following?  The member has NOT experienced a positive clinical response to Entyvio  Per the Health Plan's clinical policy for the requested drug and disease state, this authorization may only be approved for up to 12 months, do you want to proceed?  Yes, I do want to proceed Originally Requested Drug InformationRequested Drug Information Drug Dosage & Administration Approved Dosage & Billing Units Drug Code Drug Name Number of Doses per Admin Dose Frequency of Admin Total Number of Doses for Auth Duration Count Frequency Total Standard Units J3380 Inj Vedolizumab  Iv 1 Mg 1 300 mg Every 6 Week(s) 9 1 Weekly 2700 Units Specialty Pharmacy Justification      ----- Message from [facility] sent at 01/09/2024 8:15 AM EDT ----- Regarding: enyvio Let's apply to her insurance to increase her entyvio  to every 6 weeks Re: recent exacerbation of crohn's disease  requiring prednisone  course and having break through symptoms RV Clinical Documentation       Ayriel Texidor .pdf Upload Time 01-09-2024 11:55:01 AM Is it an Urgent Request?      No The authorization provided is not a guarantee of payment to the provider. Payment is based on the patient's benefit plan and eligibility when the services are received.

## 2024-01-10 NOTE — Telephone Encounter (Signed)
 Filled out new order for same day surgery and faxed it to [facility] [phone]. Called same day surgery also to let them know the order was coming there way. Called and informed patient of this information and gave her the number to same day where she can call them also if they do not call her.

## 2024-01-11 LAB — SURGICAL PATHOLOGY

## 2024-01-12 ENCOUNTER — Telehealth: Payer: Self-pay

## 2024-01-12 ENCOUNTER — Encounter: Payer: Medicare Other | Admitting: Physical Therapy

## 2024-01-12 NOTE — Telephone Encounter (Signed)
 Patient verbalized understanding of results

## 2024-01-12 NOTE — Telephone Encounter (Signed)
-----   Message from [name] sent at 01/11/2024 11:46 PM EDT ----- Please inform patient that the pathology results from colonoscopy does not show any active colitis which is wonderful news.  Stay with the current recommendation of increasing Entyvio  to every 6 weeks  Carolyn Campbell

## 2024-01-13 ENCOUNTER — Inpatient Hospital Stay: Attending: Internal Medicine

## 2024-01-13 ENCOUNTER — Inpatient Hospital Stay: Admitting: Internal Medicine

## 2024-01-13 ENCOUNTER — Encounter: Payer: Self-pay | Admitting: Internal Medicine

## 2024-01-13 VITALS — BP 112/56 | HR 77 | Temp 98.6°F | Resp 18 | Wt 144.0 lb

## 2024-01-13 DIAGNOSIS — J4489 Other specified chronic obstructive pulmonary disease: Secondary | ICD-10-CM | POA: Insufficient documentation

## 2024-01-13 DIAGNOSIS — I2699 Other pulmonary embolism without acute cor pulmonale: Secondary | ICD-10-CM

## 2024-01-13 DIAGNOSIS — Z7901 Long term (current) use of anticoagulants: Secondary | ICD-10-CM

## 2024-01-13 DIAGNOSIS — Z86711 Personal history of pulmonary embolism: Secondary | ICD-10-CM | POA: Diagnosis not present

## 2024-01-13 DIAGNOSIS — E039 Hypothyroidism, unspecified: Secondary | ICD-10-CM | POA: Diagnosis not present

## 2024-01-13 DIAGNOSIS — I2510 Atherosclerotic heart disease of native coronary artery without angina pectoris: Secondary | ICD-10-CM | POA: Diagnosis not present

## 2024-01-13 DIAGNOSIS — K5090 Crohn's disease, unspecified, without complications: Secondary | ICD-10-CM | POA: Insufficient documentation

## 2024-01-13 DIAGNOSIS — D649 Anemia, unspecified: Secondary | ICD-10-CM

## 2024-01-13 DIAGNOSIS — Z803 Family history of malignant neoplasm of breast: Secondary | ICD-10-CM | POA: Diagnosis not present

## 2024-01-13 DIAGNOSIS — I10 Essential (primary) hypertension: Secondary | ICD-10-CM | POA: Insufficient documentation

## 2024-01-13 DIAGNOSIS — Z8673 Personal history of transient ischemic attack (TIA), and cerebral infarction without residual deficits: Secondary | ICD-10-CM | POA: Diagnosis not present

## 2024-01-13 LAB — COMPREHENSIVE METABOLIC PANEL WITH GFR
ALT: 17 U/L (ref 0–44)
AST: 22 U/L (ref 15–41)
Albumin: 3.6 g/dL (ref 3.5–5.0)
Alkaline Phosphatase: 59 U/L (ref 38–126)
Anion gap: 8 (ref 5–15)
BUN: 13 mg/dL (ref 8–23)
CO2: 26 mmol/L (ref 22–32)
Calcium: 9.3 mg/dL (ref 8.9–10.3)
Chloride: 105 mmol/L (ref 98–111)
Creatinine, Ser: 0.75 mg/dL (ref 0.44–1.00)
GFR, Estimated: >60 mL/min (ref >60)
Glucose, Bld: 123 mg/dL — ABNORMAL HIGH (ref 70–99)
Potassium: 3.2 mmol/L — ABNORMAL LOW (ref 3.5–5.1)
Sodium: 139 mmol/L (ref 135–145)
Total Bilirubin: 0.7 mg/dL (ref 0.0–1.2)
Total Protein: 6.8 g/dL (ref 6.5–8.1)

## 2024-01-13 LAB — CBC WITH DIFFERENTIAL/PLATELET
Abs Immature Granulocytes: 0.01 10*3/uL (ref 0.00–0.07)
Basophils Absolute: 0.1 10*3/uL (ref 0.0–0.1)
Basophils Relative: 1 %
Eosinophils Absolute: 0.1 10*3/uL (ref 0.0–0.5)
Eosinophils Relative: 3 %
HCT: 33.3 % — ABNORMAL LOW (ref 36.0–46.0)
Hemoglobin: 10.9 g/dL — ABNORMAL LOW (ref 12.0–15.0)
Immature Granulocytes: 0 %
Lymphocytes Relative: 26 %
Lymphs Abs: 1.2 10*3/uL (ref 0.7–4.0)
MCH: 29.1 pg (ref 26.0–34.0)
MCHC: 32.7 g/dL (ref 30.0–36.0)
MCV: 89 fL (ref 80.0–100.0)
Monocytes Absolute: 0.4 10*3/uL (ref 0.1–1.0)
Monocytes Relative: 9 %
Neutro Abs: 2.9 10*3/uL (ref 1.7–7.7)
Neutrophils Relative %: 61 %
Platelets: 219 10*3/uL (ref 150–400)
RBC: 3.74 MIL/uL — ABNORMAL LOW (ref 3.87–5.11)
RDW: 13.8 % (ref 11.5–15.5)
WBC: 4.8 10*3/uL (ref 4.0–10.5)
nRBC: 0 % (ref 0.0–0.2)

## 2024-01-13 NOTE — Patient Instructions (Signed)
 Please take potassium supplements 20 mEq once daily for 10 days.

## 2024-01-13 NOTE — Progress Notes (Signed)
 [facility]  [phone] [phone]  ID: Rossie Coon OB: [birth year]  MR#: [id number]  AOZ#:[id number]  Patient Care Team: Adeline Hone, PA-C as PCP - General (Family Medicine) Devorah Fonder, MD as PCP - Cardiology (Cardiology) Selena Daily, MD as Consulting Physician (Gastroenterology) Reche Canales, OD as Consulting Physician (Optometry) Lesly Raspberry, MD as Consulting Physician (Otolaryngology) Timmy Forbes, MD as Consulting Physician (Oncology)  Reason for visit-recurrent PE.  On long term anticoagulation with Eliquis .  HPI: Carolyn Campbell is a 68 y.o. female with past medical history of COPD, COVID, Crohn's, hyperlipidemia, hypertension, hypothyroidism, CAD, stroke, depression was referred to hematology with history of recurrent PE.  Patient was admitted in October 2023 for shortness of breath.  CTA chest from 07/19/2022 showed small clot in the lower lobe branch of right pulmonary artery with mild right heart strain. At that time she also had episode of catatonia and loss of consciousness.  She was evaluated by neurology and psychiatry with EEG.  There was concern for behavioral disorder. Patient denies any history of recent surgery, long distance flights or OCP use.  Patient had PE in August 2022 which was attributed to her recent COVID infection.  She was anticoagulated for 3 months.  She is currently on Eliquis  and tolerating well.  She had colonoscopy with Dr. Baldomero Bone in January 2024.  Ileum was normal.  Mucosal inflammatory changes secondary to Crohn's with colonic involvement were present.  Distal rectum and anal verge were normal.  Interval history- Patient was seen today as follow-up for recurrent PE on long-term anticoagulation with Eliquis . She recently had colonoscopy for Crohn's.  Currently maintained on Entyvio  infusions.  Denies any bleeding in urine or stools.   REVIEW OF SYSTEMS:   Review of Systems   Constitutional:  Negative for chills, fever, malaise/fatigue and weight loss.  HENT:  Negative for nosebleeds.   Eyes: Negative.   Respiratory:  Negative for cough, hemoptysis, sputum production and shortness of breath.   Cardiovascular:  Negative for chest pain and leg swelling.  Gastrointestinal:  Negative for abdominal pain, blood in stool, constipation, diarrhea, heartburn, nausea and vomiting.  Genitourinary: Negative.  Negative for hematuria.  Musculoskeletal:  Positive for back pain, joint pain and neck pain. Negative for falls.  Skin: Negative.   Neurological:  Negative for dizziness, tingling, sensory change, speech change, weakness and headaches.  Endo/Heme/Allergies:  Bruises/bleeds easily.  Psychiatric/Behavioral:  Negative for depression. The patient is not nervous/anxious.   As per HPI. Otherwise, a complete review of systems is negative.  PAST MEDICAL HISTORY: Past Medical History:  Diagnosis Date   Abdominal pain, epigastric    Acute metabolic encephalopathy 07/22/2022   Allergic rhinitis    Arthritis    Asthma    Breast cancer (HCC) 2006   LT LUMPECTOMY   Cataract    Clotting disorder (HCC)    Cognitive deficits as late effect of cerebrovascular disease    Convulsions, unspecified convulsion type (HCC) 09/24/2022   COPD (chronic obstructive pulmonary disease) (HCC)    COVID-19 virus infection 04/2021   Crohn's disease (HCC) 05/21/2015   FOLLOWED BY GI   Crohn's disease of both small and large intestine with rectal bleeding (HCC) 12/04/2014   Depression    Currently taking zoloft .   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 05/2021 Echo: EF 60-65%, no rwma, nl RV fxn.   History of kidney stones     Hypercholesterolemia 01/18/2007   Hyperlipidemia    Hypertension    Hypothyroidism    Low back pain radiating to left leg 06/17/2021   Nonobstructive CAD (coronary artery disease)    a. 10/2012 Cath: diffuse minor irregs-->med rx; b. 08/2021 Cor CTA: Ca2+ = 55.6 (77th%'ile), LAD 25p, LCX <25p. No signif non-cardiac findings.   Occlusion, cerebral artery    NOS w/infarction   Osteoporosis    Overweight (BMI 25.0-29.9) 07/17/2022   Personal history of radiation therapy 2006   BREAST CA   Pulmonary embolism (HCC)    a.04/2021 CTA Chest: Small filling defects are noted in upper and lower lobe branches of L PA-->acute PE-->eliquis . *PE occurred in context of COVID infxn.   Stroke [facility])    Ulcer     PAST SURGICAL HISTORY: Past Surgical History:  Procedure Laterality Date   BREAST BIOPSY Left 2006   POS   BREAST LUMPECTOMY Left 09/20/2004   positive   BREAST SURGERY Left    malignant biopsy   CARDIAC CATHETERIZATION     COLONOSCOPY  2015   COLONOSCOPY N/A 01/09/2024   Procedure: COLONOSCOPY;  Surgeon: Selena Daily, MD;  Location: [facility];  Service: Endoscopy;  Laterality: N/A;   COLONOSCOPY WITH PROPOFOL  N/A 06/08/2017   Procedure: COLONOSCOPY WITH PROPOFOL ;  Surgeon: Selena Daily, MD;  Location: [facility];  Service: Gastroenterology;  Laterality: N/A;   COLONOSCOPY WITH PROPOFOL  N/A 03/08/2019   Procedure: COLONOSCOPY WITH PROPOFOL ;  Surgeon: Selena Daily, MD;  Location: [facility];  Service: Gastroenterology;  Laterality: N/A;   COLONOSCOPY WITH PROPOFOL  N/A 07/16/2020   Procedure: COLONOSCOPY WITH PROPOFOL ;  Surgeon: Selena Daily, MD;  Location: [facility] ENDOSCOPY;  Service: Gastroenterology;  Laterality: N/A;   COLONOSCOPY WITH PROPOFOL  N/A 09/30/2022   Procedure: COLONOSCOPY WITH PROPOFOL ;  Surgeon: Selena Daily, MD;  Location: [facility] ENDOSCOPY;  Service: Gastroenterology;  Laterality: N/A;   ESOPHAGOGASTRODUODENOSCOPY  2015    ESOPHAGOGASTRODUODENOSCOPY (EGD) WITH PROPOFOL  N/A 06/08/2017   Procedure: ESOPHAGOGASTRODUODENOSCOPY (EGD) WITH PROPOFOL ;  Surgeon: Selena Daily, MD;  Location: [facility];  Service: Gastroenterology;  Laterality: N/A;   ESOPHAGOGASTRODUODENOSCOPY (EGD) WITH PROPOFOL  N/A 03/08/2019   Procedure: ESOPHAGOGASTRODUODENOSCOPY (EGD) WITH PROPOFOL ;  Surgeon: Selena Daily, MD;  Location: [facility] ENDOSCOPY;  Service: Gastroenterology;  Laterality: N/A;   EYE SURGERY     FINGER SURGERY     Right small finger   FRACTURE SURGERY     GIVENS CAPSULE STUDY  2015   TUBAL LIGATION      FAMILY HISTORY: Family History  Problem Relation Age of Onset   Heart disease Mother    Heart attack Mother    Hypertension Mother    Varicose Veins Mother    Cerebrovascular Accident Father    Arthritis Father    Hypertension Father    Dementia Father    Breast cancer Sister    Cancer Sister    Breast cancer Sister 35   Cancer Sister    Cancer Sister    Hypertension Other    Diabetes Other    Heart attack Other 79   Mental illness Neg Hx     HEALTH MAINTENANCE: Social History   Tobacco Use   Smoking status: Never   Smokeless tobacco: Never  Vaping Use  Vaping status: Never Used  Substance Use Topics   Alcohol use: Never   Drug use: Never     No Known Allergies  Current Outpatient Medications  Medication Sig Dispense Refill   albuterol  (VENTOLIN  HFA) 108 (90 Base) MCG/ACT inhaler Inhale 2 puffs into the lungs every 6 (six) hours as needed for wheezing or shortness of breath (chest tightness). 8 g 0   benzonatate  (TESSALON ) 100 MG capsule Take 1-2 capsules (100-200 mg total) by mouth 3 (three) times daily as needed for cough. 30 capsule 0   brimonidine (ALPHAGAN) 0.2 % ophthalmic solution Place 1 drop into both eyes in the morning and at bedtime.     ELIQUIS  5 MG TABS tablet TAKE ONE TABLET BY MOUTH TWICE A DAY 180 tablet 3   gabapentin  (NEURONTIN ) 300 MG capsule 1 po qHS x 4  days, then bid x 4 days, then tid     latanoprost  (XALATAN ) 0.005 % ophthalmic solution Place 1 drop into both eyes at bedtime.      levothyroxine  (SYNTHROID ) 50 MCG tablet TAKE ONE TABLET BY MOUTH BEFORE BREAKFAST FOR 5 DAYS, AND TAKE TWO  TABLETS BEFORE BREAKFAST FOR 2 DAYS 110 tablet 1   losartan -hydrochlorothiazide  (HYZAAR) 50-12.5 MG tablet Take 1 tablet by mouth daily. 90 tablet 1   meloxicam  (MOBIC ) 15 MG tablet Take 0.5-1 tablets (7.5-15 mg total) by mouth daily. 30 tablet 0   Multiple Vitamin (MULTIVITAMIN) tablet Take 1 tablet by mouth daily.     rosuvastatin  (CRESTOR ) 40 MG tablet Take 1 tablet (40 mg total) by mouth daily. 90 tablet 3   sertraline  (ZOLOFT ) 50 MG tablet Take 1 tablet by mouth once daily 90 tablet 0   vedolizumab  (ENTYVIO ) 300 MG injection Induction Entyvio  300mg  at week 0,2, and 6 and then maintenance does every 8 weeks 1 each 12   pantoprazole  (PROTONIX ) 40 MG tablet Take 1 tablet (40 mg total) by mouth 2 (two) times daily before a meal. (Patient not taking: Reported on 01/13/2024) 60 tablet 0   No current facility-administered medications for this visit.    OBJECTIVE: Vitals:   01/13/24 1122  BP: (!) 112/56  Pulse: 77  Resp: 18  Temp: 98.6 F (37 C)  SpO2: 99%      Body mass index is 28.12 kg/m.      General: Well-developed, well-nourished, no acute distress. Eyes: Pink conjunctiva, anicteric sclera. Lungs: Clear to auscultation bilaterally.  No audible wheezing or coughing Heart: Regular rate and rhythm.  Abdomen: Soft, nontender, nondistended.  Musculoskeletal: No edema, cyanosis, or clubbing. Neuro: Alert, answering all questions appropriately. Cranial nerves grossly intact. Skin: No rashes or petechiae noted. Psych: Normal affect.  LAB RESULTS: Lab Results  Component Value Date   NA 139 01/13/2024   K 3.2 (L) 01/13/2024   CL 105 01/13/2024   CO2 26 01/13/2024   GLUCOSE 123 (H) 01/13/2024   BUN 13 01/13/2024   CREATININE 0.75 01/13/2024    CALCIUM  9.3 01/13/2024   PROT 6.8 01/13/2024   ALBUMIN 3.6 01/13/2024   AST 22 01/13/2024   ALT 17 01/13/2024   ALKPHOS 59 01/13/2024   BILITOT 0.7 01/13/2024   GFRNONAA >60 01/13/2024   GFRAA 78 01/29/2021    Lab Results  Component Value Date   WBC 4.8 01/13/2024   NEUTROABS 2.9 01/13/2024   HGB 10.9 (L) 01/13/2024   HCT 33.3 (L) 01/13/2024   MCV 89.0 01/13/2024   PLT 219 01/13/2024    Lab Results  Component Value Date  TIBC 368 09/08/2022   TIBC 330 10/17/2019   TIBC 378 01/17/2018   FERRITIN 56 09/08/2022   FERRITIN 66 04/23/2022   FERRITIN 76 10/17/2019   IRONPCTSAT 16 09/08/2022   IRONPCTSAT 26 10/17/2019   IRONPCTSAT 14 01/17/2018     STUDIES: No results found.  ASSESSMENT AND PLAN:   PEARLINE YERBY is a 68 y.o. female with pmh of of COPD, COVID, Crohn's, hyperlipidemia, hypertension, hypothyroidism, CAD, stroke, depression was referred to hematology with history of recurrent PE.  # Recurrent PE-  - history of PE in August 2022, likely from Covid infection. PE in October 2023, unprovoked. Hypercoaguable workup was negative. Due to 2 episodes of blood clots, lifelong anticoagulation was recommended. She continues eliquis  5 mg twice daily. Tolerating well. Denies bleeding. Reviewed bleeding precautions and fall precautions.   # Crohn's disease -Follows with Dr. Baldomero Bone.   -Previously treated with Imuran . Resumed Entyvio   # Mild anemia - Hb 10.9. Mild. Fluctuates. Will add iron panel/ ferritin.  - She will continue to follow with PCP for monitoring. If worsened, can reevaluate further.   Disposition:  RTC 1 year MD visit, labs  Orders Placed This Encounter  Procedures   CBC with Differential (Cancer Center Only)   CMP (Cancer Center only)   Iron and TIBC([facility])   Ferritin   Patient expressed understanding and was in agreement with this plan. She also understands that She can call clinic at any time with any questions, concerns, or complaints.    I spent a total of 20 minutes reviewing chart data, face-to-face evaluation with the patient, counseling and coordination of care as detailed above.  Anselm Aumiller, MD   01/13/2024

## 2024-01-13 NOTE — Progress Notes (Signed)
 Patient had a colonoscopy on 01/09/2024. For the medication Eliquis , how late in the day is it too late for her to take the eliquis  before she would need to hold off and just take it again the next day.

## 2024-01-16 ENCOUNTER — Encounter: Payer: Medicare Other | Admitting: Physical Therapy

## 2024-01-16 ENCOUNTER — Telehealth: Payer: Self-pay

## 2024-01-16 NOTE — Telephone Encounter (Signed)
 Patient called and left a message on my voicemail stating that she tried to call same day surgery to schedule her infusion for every 6 weeks but when she called they said they would need a order. Called same day surgery at [phone] and informed them this information. Informed them that the order was faxed on 01/10/2024. She states I would have to talk to one of the nurses and they said they have a order. They said they will call the patient and get the patient in. Called and informed patient by a detail message of what the status is

## 2024-01-18 ENCOUNTER — Encounter: Payer: Medicare Other | Admitting: Physical Therapy

## 2024-01-20 ENCOUNTER — Other Ambulatory Visit: Payer: Self-pay | Admitting: *Deleted

## 2024-01-20 ENCOUNTER — Encounter: Payer: Self-pay | Admitting: Gastroenterology

## 2024-01-20 ENCOUNTER — Telehealth: Payer: Self-pay | Admitting: *Deleted

## 2024-01-20 ENCOUNTER — Inpatient Hospital Stay: Attending: Oncology

## 2024-01-20 DIAGNOSIS — K5090 Crohn's disease, unspecified, without complications: Secondary | ICD-10-CM | POA: Diagnosis not present

## 2024-01-20 DIAGNOSIS — Z86711 Personal history of pulmonary embolism: Secondary | ICD-10-CM | POA: Insufficient documentation

## 2024-01-20 DIAGNOSIS — D649 Anemia, unspecified: Secondary | ICD-10-CM | POA: Insufficient documentation

## 2024-01-20 DIAGNOSIS — Z7901 Long term (current) use of anticoagulants: Secondary | ICD-10-CM

## 2024-01-20 DIAGNOSIS — Z79899 Other long term (current) drug therapy: Secondary | ICD-10-CM | POA: Diagnosis not present

## 2024-01-20 DIAGNOSIS — K921 Melena: Secondary | ICD-10-CM

## 2024-01-20 DIAGNOSIS — K50918 Crohn's disease, unspecified, with other complication: Secondary | ICD-10-CM

## 2024-01-20 DIAGNOSIS — K5010 Crohn's disease of large intestine without complications: Secondary | ICD-10-CM

## 2024-01-20 LAB — CBC WITH DIFFERENTIAL (CANCER CENTER ONLY)
Abs Immature Granulocytes: 0.03 10*3/uL (ref 0.00–0.07)
Basophils Absolute: 0.1 10*3/uL (ref 0.0–0.1)
Basophils Relative: 1 %
Eosinophils Absolute: 0.2 10*3/uL (ref 0.0–0.5)
Eosinophils Relative: 3 %
HCT: 37.1 % (ref 36.0–46.0)
Hemoglobin: 11.7 g/dL — ABNORMAL LOW (ref 12.0–15.0)
Immature Granulocytes: 0 %
Lymphocytes Relative: 30 %
Lymphs Abs: 2 10*3/uL (ref 0.7–4.0)
MCH: 28.7 pg (ref 26.0–34.0)
MCHC: 31.5 g/dL (ref 30.0–36.0)
MCV: 90.9 fL (ref 80.0–100.0)
Monocytes Absolute: 0.5 10*3/uL (ref 0.1–1.0)
Monocytes Relative: 7 %
Neutro Abs: 4 10*3/uL (ref 1.7–7.7)
Neutrophils Relative %: 59 %
Platelet Count: 225 10*3/uL (ref 150–400)
RBC: 4.08 MIL/uL (ref 3.87–5.11)
RDW: 14.2 % (ref 11.5–15.5)
WBC Count: 6.9 10*3/uL (ref 4.0–10.5)
nRBC: 0 % (ref 0.0–0.2)

## 2024-01-20 LAB — IRON AND TIBC
Iron: 45 ug/dL (ref 28–170)
Saturation Ratios: 11 % (ref 10.4–31.8)
TIBC: 399 ug/dL (ref 250–450)
UIBC: 354 ug/dL

## 2024-01-20 LAB — SAMPLE TO BLOOD BANK

## 2024-01-20 LAB — FERRITIN: Ferritin: 51 ng/mL (ref 11–307)

## 2024-01-20 NOTE — Telephone Encounter (Signed)
 Patient called in because she says that she was told by the doctor that if she sees any blood in her stool to definitely call and talk to them it has been for 2 days that it has been happening as well as she has her stomach that is bothering her also

## 2024-01-20 NOTE — Telephone Encounter (Signed)
 Dr. Aris Bel patient.   She's scheduled to see Dr. Wilhelmenia Harada next year.

## 2024-01-23 ENCOUNTER — Encounter: Payer: Medicare Other | Admitting: Physical Therapy

## 2024-01-23 NOTE — Telephone Encounter (Signed)
 Labs drawn on Friday- stable. Pt was made aware.

## 2024-01-25 ENCOUNTER — Encounter: Payer: Medicare Other | Admitting: Physical Therapy

## 2024-01-25 MED ORDER — PREDNISONE 10 MG PO TABS
ORAL_TABLET | ORAL | 0 refills | Status: AC
Start: 2024-01-25 — End: 2024-02-16

## 2024-01-26 DIAGNOSIS — K50918 Crohn's disease, unspecified, with other complication: Secondary | ICD-10-CM | POA: Diagnosis not present

## 2024-01-26 DIAGNOSIS — K5010 Crohn's disease of large intestine without complications: Secondary | ICD-10-CM | POA: Diagnosis not present

## 2024-01-27 LAB — C-REACTIVE PROTEIN: CRP: 14 mg/L — ABNORMAL HIGH (ref 0–10)

## 2024-01-29 DIAGNOSIS — K5010 Crohn's disease of large intestine without complications: Secondary | ICD-10-CM | POA: Diagnosis not present

## 2024-01-29 DIAGNOSIS — K50918 Crohn's disease, unspecified, with other complication: Secondary | ICD-10-CM | POA: Diagnosis not present

## 2024-01-30 ENCOUNTER — Encounter: Payer: Medicare Other | Admitting: Physical Therapy

## 2024-01-31 ENCOUNTER — Ambulatory Visit: Payer: Self-pay | Admitting: Gastroenterology

## 2024-01-31 ENCOUNTER — Ambulatory Visit
Admission: RE | Admit: 2024-01-31 | Discharge: 2024-01-31 | Disposition: A | Discharge status: Home / Self Care | Source: Ambulatory Visit | Attending: Gastroenterology | Admitting: Gastroenterology

## 2024-01-31 DIAGNOSIS — H02834 Dermatochalasis of left upper eyelid: Secondary | ICD-10-CM | POA: Diagnosis not present

## 2024-01-31 DIAGNOSIS — H43813 Vitreous degeneration, bilateral: Secondary | ICD-10-CM | POA: Diagnosis not present

## 2024-01-31 DIAGNOSIS — K50918 Crohn's disease, unspecified, with other complication: Secondary | ICD-10-CM | POA: Diagnosis present

## 2024-01-31 DIAGNOSIS — Z79620 Long term (current) use of immunosuppressive biologic: Secondary | ICD-10-CM | POA: Insufficient documentation

## 2024-01-31 DIAGNOSIS — Z961 Presence of intraocular lens: Secondary | ICD-10-CM | POA: Diagnosis not present

## 2024-01-31 DIAGNOSIS — K50118 Crohn's disease of large intestine with other complication: Secondary | ICD-10-CM | POA: Insufficient documentation

## 2024-01-31 DIAGNOSIS — H04123 Dry eye syndrome of bilateral lacrimal glands: Secondary | ICD-10-CM | POA: Diagnosis not present

## 2024-01-31 DIAGNOSIS — H35371 Puckering of macula, right eye: Secondary | ICD-10-CM | POA: Diagnosis not present

## 2024-01-31 DIAGNOSIS — H401132 Primary open-angle glaucoma, bilateral, moderate stage: Secondary | ICD-10-CM | POA: Diagnosis not present

## 2024-01-31 DIAGNOSIS — K5010 Crohn's disease of large intestine without complications: Secondary | ICD-10-CM | POA: Diagnosis present

## 2024-01-31 DIAGNOSIS — H02831 Dermatochalasis of right upper eyelid: Secondary | ICD-10-CM | POA: Diagnosis not present

## 2024-01-31 LAB — CALPROTECTIN, FECAL: Calprotectin, Fecal: 3150 ug/g — ABNORMAL HIGH (ref 0–120)

## 2024-01-31 MED ORDER — VEDOLIZUMAB 300 MG IV SOLR
300.0000 mg | INTRAVENOUS | Status: DC
  Administered 2024-01-31: 300 mg via INTRAVENOUS
  Filled 2024-01-31: qty 5

## 2024-01-31 NOTE — Discharge Instructions (Signed)
 Keep all follow up appointments with your doctor.  If you have any symptom flare-ups please reach out to your doctor. If you have an emergency- chest pain, shortness of breath, etc- go tot he emergency department.  Next appt for infusion- 03/13/24, 10:30 am.

## 2024-02-01 ENCOUNTER — Encounter: Payer: Medicare Other | Admitting: Physical Therapy

## 2024-02-02 ENCOUNTER — Other Ambulatory Visit
Admission: RE | Admit: 2024-02-02 | Discharge: 2024-02-02 | Disposition: A | Discharge status: Home / Self Care | Source: Ambulatory Visit | Attending: Gastroenterology | Admitting: Gastroenterology

## 2024-02-02 ENCOUNTER — Ambulatory Visit (INDEPENDENT_AMBULATORY_CARE_PROVIDER_SITE_OTHER): Admitting: Gastroenterology

## 2024-02-02 VITALS — BP 151/68 | HR 66 | Temp 98.7°F | Wt 143.0 lb

## 2024-02-02 DIAGNOSIS — K219 Gastro-esophageal reflux disease without esophagitis: Secondary | ICD-10-CM

## 2024-02-02 DIAGNOSIS — K5010 Crohn's disease of large intestine without complications: Secondary | ICD-10-CM | POA: Insufficient documentation

## 2024-02-02 DIAGNOSIS — K50911 Crohn's disease, unspecified, with rectal bleeding: Secondary | ICD-10-CM

## 2024-02-02 LAB — C-REACTIVE PROTEIN: CRP: <0.5 mg/dL (ref <1.0)

## 2024-02-02 NOTE — Patient Instructions (Signed)
 [facility]  [address], [place], [place] [address] Phone: [phone]

## 2024-02-03 ENCOUNTER — Ambulatory Visit: Payer: Self-pay | Admitting: Gastroenterology

## 2024-02-04 ENCOUNTER — Encounter: Payer: Self-pay | Admitting: Gastroenterology

## 2024-02-04 NOTE — Progress Notes (Signed)
 Karma Oz, MD [address]  [address]  [place], [place] [address]  Main: [phone]  Fax: [phone]    Gastroenterology Consultation  Referring Provider:     Adeline Hone, PA-C Primary Care Physician:  Adeline Hone, PA-C Primary Gastroenterologist:  Dr. Karma Oz Reason for Consultation: Crohn's colitis        HPI:   Carolyn Campbell is a 68 y.o. African-American female referred by Adeline Hone, PA-C  for consultation & management of Crohn's disease.  Patient has not seen me since May 2022.  She self discontinued Entyvio  sometime late 2022 because she was she was doing so well. Since December 2023, she has been experiencing urgency, loss of appetite, rectal bleeding and loose stools secondary to exacerbation of Crohn's colitis.  Stool studies were negative for infection and her fecal calprotectin levels were significantly elevated to 1140.  Subsequently, underwent colonoscopy in 09/2022 which revealed mild chronic inactive colitis extending from sigmoid to cecum, but moderate to severe proctitis.  Entyvio  has been restarted and she has done remarkably well, received short course of prednisone .  Patient is currently in clinical remission.  She denies any rectal urgency, rectal bleeding.  Weight has been stable.  Appetite is good.  She reports having a hard time getting an IV access with every infusion.  Most recent labs from 05/2023 were normal  Follow-up visit 12/07/2023 Carolyn Campbell is here for follow-up as an urgent visit given recent exacerbation of Crohn's colitis.  She originally called our office on 11/21/2023 with symptoms of lower abdominal pain associated with 3-4 times of diarrhea daily and nausea.  Her symptoms started after she had pasta salad.  Upon investigation, her stool studies were negative for infection including C. difficile, CRP was significantly elevated at 136 mg/L, significantly elevated fecal calprotectin levels 961. She went to the ER on 3/5, negative UA,  mildly elevated serum lipase, as well as her LFTs including AST, ALT and alkaline phosphatase were elevated to 129, 183, 197, normal T. bili, CBC revealed mild normocytic anemia, underwent CT abdomen pelvis with contrast which revealed mild wall thickening and mucosal hyperenhancement seen throughout the ascending and transverse colon consistent with colitis.  She was discharged home on prednisone  for flareup of Crohn's disease. Today, patient reports that her diarrhea has resolved, denies any abdominal pain, nausea, rectal bleeding, however she does not feel good, reports ongoing fatigue, feels less cognizant and coherent.  Able to sleep good.  Her appetite is okay. She is accompanied by her daughter today Patient also admits to not missing Entyvio  infusion as scheduled.  She notices return of symptoms right before the infusion.  She is due for the infusion in 2 weeks  Follow-up visit 02/02/2024 Carolyn Campbell is here for follow-up of another exacerbation of Crohn's colitis.  She received her Entyvio  infusion on Tuesday.  She is going through another flareup with nausea, rectal bleeding and soft stools as well as abdominal pain.  Unfortunately, her CRP came back elevated along with fecal calprotectin levels.  I started her on prednisone  40 mg once a day.  Rectal bleeding has improved along with consistency of the stools.  Her abdominal pain has also slightly improved.  She is no longer experiencing loose stools or diarrhea, denies any fever, nausea or vomiting.  Crohn's disease classification:  Age: > 40 Location: ileocolonic  Behavior: non stricturing, non penetrating  Perianal: no  IBD diagnosis: 11/2013  Disease course:Crohn's disease in 2015, initial symptoms were abdominal  pain, blood in stool and on wiping, bloating. She had a colonoscopy, VCE, EGD at the time of diagnosis. She was initially on mesalamine  4 pills daily, prednisone  later switched to Remicade  in 11/2014. She took only 5 doses as she  could not afford. Then she was switched to Imuran  50 mg daily. She had mild flareups about twice a year. Colonoscopy 05/2017 revealed mild ileocolonic disease primarily on histology. She self discontinued Imuran . Lialda  started in 06/2017.  Possible exacerbation of Crohn's disease with elevated fecal calprotectin in 10/2018.  CT abdomen and pelvis unremarkable.  Discontinued Lialda , started on azathioprine  50 mg daily.  Flareup of Crohn's in 10/2018, confirmed on colonoscopy and elevated fecal calprotectin levels.  Short prednisone  course and started Entyvio  on 05/23/2019 Discontinued azathioprine  in 09/2019.  Normal fecal calprotectin levels, normal CRP in 09/2019.  Has been in clinical and histologic remission.  Patient self discontinued Entyvio  in 06/2021 because she felt she has been doing well and did not have to take it long-term.  Resulted in exacerbation of Crohn's colitis, elevated fecal calprotectin levels >1000, colonoscopy in January 2024 revealed mild chronic active colitis in the colon, moderate to severe proctitis.  Entyvio  has been reinitiated in January 2024  Extra intestinal manifestations: None  IBD surgical history: None No family history of IBD  Imaging:  MRE none CTE none SBFT none  Procedures: Colonoscopy 01/09/2024 - The examined portion of the ileum was normal. - The entire examined colon is normal. Biopsied. Rectum was normal, biopsied - COLONIC MUCOSA WITH NO SPECIFIC HISTOPATHOLOGIC CHANGES      - NEGATIVE FOR ACUTE INFLAMMATION, FEATURES OF CHRONICITY, GRANULOMAS OR      DYSPLASIA       2. Left Colon Biopsy,  :      - COLONIC MUCOSA WITH NO SPECIFIC HISTOPATHOLOGIC CHANGES      - NEGATIVE FOR ACUTE INFLAMMATION, FEATURES OF CHRONICITY, GRANULOMAS OR      DYSPLASIA       3. Rectum, biopsy,  :      - COLONIC MUCOSA WITH NO SPECIFIC HISTOPATHOLOGIC CHANGES      - NEGATIVE FOR ACUTE INFLAMMATION, FEATURES OF CHRONICITY, GRANULOMAS OR      DYSPLASIA   Colonoscopy  09/30/2022 DIAGNOSIS:  A. COLON, RIGHT; COLD BIOPSY:  - MILD CHRONIC INACTIVE COLITIS CONSISTENT WITH PATIENT'S KNOWN HISTORY  OF CROHN'S DISEASE.  - NEGATIVE FOR GRANULOMA, DYSPLASIA AND MALIGNANCY.   B.  COLON, LEFT; COLD BIOPSY:  - MILD CHRONIC INACTIVE COLITIS, CONSISTENT WITH PATIENT'S KNOWN HISTORY  OF CROHN'S DISEASE.  - NEGATIVE FOR GRANULOMA, DYSPLASIA OR MALIGNANCY.   C RECTUM; COLD BIOPSY:  - CHRONIC, MODERATE TO MARKEDLY ACTIVE COLITIS WITH ULCERATION AND  CRYPTITIS, CONSISTENT WITH PATIENT'S KNOWN HISTORY OF CROHN'S DISEASE.  - NEGATIVE FOR GRANULOMA, DYSPLASIA AND MALIGNANCY.   Colonoscopy 07/16/2020 - The examined portion of the ileum was normal. - The entire examined colon is normal. Biopsied. - The distal rectum and anal verge are normal on retroflexion view. - Simple Endoscopic Score for Crohn's Disease: 0, mucosal inflammatory changes secondary to quiescent Crohn's disease. DIAGNOSIS:  A. COLON POLYP, CECUM; COLD SNARE:  - HYPERPLASTIC POLYP.  - NEGATIVE FOR DYSPLASIA AND MALIGNANCY.   B.  COLON, RIGHT; COLD BIOPSY:  - FOCAL MILD CHRONIC INACTIVE COLITIS CONSISTENT WITH PATIENT'S KNOWN  HISTORY OF CROHN'S DISEASE.  - NEGATIVE FOR DYSPLASIA AND MALIGNANCY.   C.  COLON, LEFT; COLD BIOPSY:  - MILD CHRONIC INACTIVE COLITIS CONSISTENT WITH PATIENT'S KNOWN HISTORY  OF CROHN'S  DISEASE.  - NEGATIVE FOR DYSPLASIA AND MALIGNANCY.   D.  RECTUM; COLD BIOPSY:  - UNREMARKABLE COLONIC TYPE MUCOSA.  - NEGATIVE FOR DYSPLASIA AND MALIGNANCY.   Colonoscopy : 01/28/2005, showed external hemorrhoids only Colonoscopy 12/10/2013, showed normal terminal ileum, biopsies performed. Skipped areas of nonbleeding ulcerative mucosa were seen in the entire colon, biopsies performed. 4 mm polyp in the sigmoid colon Pathology: Rectal biopsy mild-to-moderate active proctitis with architectural features of chronicity, negative for dysplasia and malignancy. Terminal ileum: Small bowel mucosa  with preserved villous architecture. Right colon biopsy: No significant pathologic changes  Upper Endoscopy 12/10/2013, underwent dilation of the esophagus Ampullary biopsy: Normal, gastric biopsy: Intramucosal with erosion and mild chronic gastritis, negative for H. pylori, dysplasia and malignancy  VCE 05/06/2014: Erosions in the distal ileum  EGD and colonoscopy 03/08/2019  DIAGNOSIS:  A.  DUODENUM POLYP; COLD BIOPSY:  - DUODENAL MUCOSA WITH PROMINENT BRUNNER'S GLAND AND REACTIVE CHANGES IN  OVERLYING VILLI.  - DEEPER SECTIONS EXAMINED.  - NEGATIVE FOR ACTIVE ENTERITIS, DYSPLASIA, AND MALIGNANCY.   B.  STOMACH, RANDOM; COLD BIOPSY:  - ANTRAL MUCOSA WITH CHANGES CONSISTENT WITH ENDOSCOPIC FINDING OF  ANTRAL EROSION.  - FRAGMENTS OF UNREMARKABLE OXYNTIC MUCOSA.  - NEGATIVE FOR H. PYLORI, DYSPLASIA, AND MALIGNANCY.   C.  TERMINAL ILEUM; COLD BIOPSY:  - UNREMARKABLE SMALL INTESTINAL MUCOSA.  - NEGATIVE FOR ACTIVE ENTERITIS, DYSPLASIA, AND MALIGNANCY.   D.  COLON, RANDOM RIGHT; COLD BIOPSY:  - MODERATE CHRONIC ACTIVE COLITIS CONSISTENT WITH PATIENT'S KNOWN  HISTORY OF CROHN'S.  - NEGATIVE FOR VIRAL CYTOPATHIC EFFECT, DYSPLASIA, AND MALIGNANCY.   E.  COLON, RANDOM TRANSVERSE; COLD BIOPSY:  - MILD CHRONIC ACTIVE COLITIS INVOLVING A MINORITY OF THE BIOPSY  FRAGMENTS.  - CHRONIC INACTIVE COLITIS INVOLVING THE MAJORITY OF THE BIOPSY  FRAGMENTS.  - NEGATIVE FOR VIRAL CYTOPATHIC EFFECT, DYSPLASIA, AND MALIGNANCY.   F.  COLON, RANDOM LEFT; COLD BIOPSY:  - PATCHY MILD CHRONIC INACTIVE COLITIS.  - NEGATIVE FOR DYSPLASIA AND MALIGNANCY.   G.  RECTUM, RANDOM PROCTITIS; COLD BIOPSY:  - MARKED CHRONIC ACTIVE PROCTITIS CONSISTENT WITH PATIENT'S KNOWN  HISTORY OF CROHN'S.  - NEGATIVE FOR VIRAL CYTOPATHIC EFFECT, DYSPLASIA, AND MALIGNANCY.  EGD 06/08/2017 Normal esophagus, small hiatal hernia, normal stomach and duodenum  Colonoscopy 06/08/2017 The perianal and digital rectal  examinations were normal. Pertinent negatives include normal sphincter tone and no palpable rectal Lesions. The terminal ileum appeared normal. Biopsies were taken with a cold forceps for histology. Two scattered non-bleeding aphthae were found in the ascending colon. Rest of the colon and rectum appeared normal. Random biopsies performed   IBD medications:  Steroids: Prednisone  steroid responsive, budesonide  5-ASA: mesalamine , Lialda  in the past Immunomodulators: azathioprine , discontinued in 2020 TPMT status normal Biologics:  Anti TNFs: Remicade  monotherapy, received induction followed by 1 maintenance dose in 11/2014 Anti Integrins: Entyvio  started on 05/23/2019 Ustekinumab: Tofactinib: Clinical trial:   Past Medical History:  Diagnosis Date   Abdominal pain, epigastric    Acute metabolic encephalopathy 07/22/2022   Allergic rhinitis    Arthritis    Asthma    Breast cancer (HCC) 2006   LT LUMPECTOMY   Cataract    Clotting disorder (HCC)    Cognitive deficits as late effect of cerebrovascular disease    Convulsions, unspecified convulsion type (HCC) 09/24/2022   COPD (chronic obstructive pulmonary disease) (HCC)    COVID-19 virus infection 04/2021   Crohn's disease (HCC) 05/21/2015   FOLLOWED BY GI   Crohn's disease of both small and large  intestine with rectal bleeding (HCC) 12/04/2014   Depression    Currently taking zoloft .   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 05/2021 Echo: EF 60-65%, no rwma, nl RV fxn.   History of kidney stones    Hypercholesterolemia 01/18/2007   Hyperlipidemia    Hypertension    Hypothyroidism    Low back pain radiating to left leg 06/17/2021   Nonobstructive CAD (coronary artery disease)     a. 10/2012 Cath: diffuse minor irregs-->med rx; b. 08/2021 Cor CTA: Ca2+ = 55.6 (77th%'ile), LAD 25p, LCX <25p. No signif non-cardiac findings.   Occlusion, cerebral artery    NOS w/infarction   Osteoporosis    Overweight (BMI 25.0-29.9) 07/17/2022   Personal history of radiation therapy 2006   BREAST CA   Pulmonary embolism (HCC)    a.04/2021 CTA Chest: Small filling defects are noted in upper and lower lobe branches of L PA-->acute PE-->eliquis . *PE occurred in context of COVID infxn.   Stroke [facility])    Ulcer     Past Surgical History:  Procedure Laterality Date   BREAST BIOPSY Left 2006   POS   BREAST LUMPECTOMY Left 09/20/2004   positive   BREAST SURGERY Left    malignant biopsy   CARDIAC CATHETERIZATION     COLONOSCOPY  2015   COLONOSCOPY N/A 01/09/2024   Procedure: COLONOSCOPY;  Surgeon: Selena Daily, MD;  Location: [facility];  Service: Endoscopy;  Laterality: N/A;   COLONOSCOPY WITH PROPOFOL  N/A 06/08/2017   Procedure: COLONOSCOPY WITH PROPOFOL ;  Surgeon: Selena Daily, MD;  Location: [facility];  Service: Gastroenterology;  Laterality: N/A;   COLONOSCOPY WITH PROPOFOL  N/A 03/08/2019   Procedure: COLONOSCOPY WITH PROPOFOL ;  Surgeon: Selena Daily, MD;  Location: [facility] ENDOSCOPY;  Service: Gastroenterology;  Laterality: N/A;   COLONOSCOPY WITH PROPOFOL  N/A 07/16/2020   Procedure: COLONOSCOPY WITH PROPOFOL ;  Surgeon: Selena Daily, MD;  Location: [facility] ENDOSCOPY;  Service: Gastroenterology;  Laterality: N/A;   COLONOSCOPY WITH PROPOFOL  N/A 09/30/2022   Procedure: COLONOSCOPY WITH PROPOFOL ;  Surgeon: Selena Daily, MD;  Location: [facility] ENDOSCOPY;  Service: Gastroenterology;  Laterality: N/A;   ESOPHAGOGASTRODUODENOSCOPY  2015   ESOPHAGOGASTRODUODENOSCOPY (EGD) WITH PROPOFOL  N/A 06/08/2017   Procedure: ESOPHAGOGASTRODUODENOSCOPY (EGD) WITH PROPOFOL ;  Surgeon: Selena Daily, MD;  Location: [facility];  Service:  Gastroenterology;  Laterality: N/A;   ESOPHAGOGASTRODUODENOSCOPY (EGD) WITH PROPOFOL  N/A 03/08/2019   Procedure: ESOPHAGOGASTRODUODENOSCOPY (EGD) WITH PROPOFOL ;  Surgeon: Selena Daily, MD;  Location: [facility] ENDOSCOPY;  Service: Gastroenterology;  Laterality: N/A;   EYE SURGERY     FINGER SURGERY     Right small finger   FRACTURE SURGERY     GIVENS CAPSULE STUDY  2015   TUBAL LIGATION       Current Outpatient Medications:    albuterol  (VENTOLIN  HFA) 108 (90 Base) MCG/ACT inhaler, Inhale 2 puffs into the lungs every 6 (six) hours as needed for wheezing or shortness of breath (chest tightness)., Disp: 8 g, Rfl: 0   benzonatate  (TESSALON ) 100 MG capsule, Take 1-2 capsules (100-200 mg total) by mouth 3 (three) times daily as needed for cough., Disp: 30 capsule, Rfl: 0   brimonidine (ALPHAGAN) 0.2 % ophthalmic solution, Place 1 drop  into both eyes in the morning and at bedtime., Disp: , Rfl:    ELIQUIS  5 MG TABS tablet, TAKE ONE TABLET BY MOUTH TWICE A DAY, Disp: 180 tablet, Rfl: 3   gabapentin  (NEURONTIN ) 300 MG capsule, 1 po qHS x 4 days, then bid x 4 days, then tid, Disp: , Rfl:    latanoprost  (XALATAN ) 0.005 % ophthalmic solution, Place 1 drop into both eyes at bedtime. , Disp: , Rfl:    levothyroxine  (SYNTHROID ) 50 MCG tablet, TAKE ONE TABLET BY MOUTH BEFORE BREAKFAST FOR 5 DAYS, AND TAKE TWO  TABLETS BEFORE BREAKFAST FOR 2 DAYS, Disp: 110 tablet, Rfl: 1   losartan -hydrochlorothiazide  (HYZAAR) 50-12.5 MG tablet, Take 1 tablet by mouth daily., Disp: 90 tablet, Rfl: 1   meloxicam  (MOBIC ) 15 MG tablet, Take 0.5-1 tablets (7.5-15 mg total) by mouth daily., Disp: 30 tablet, Rfl: 0   Multiple Vitamin (MULTIVITAMIN) tablet, Take 1 tablet by mouth daily., Disp: , Rfl:    pantoprazole  (PROTONIX ) 40 MG tablet, Take 1 tablet (40 mg total) by mouth 2 (two) times daily before a meal., Disp: 60 tablet, Rfl: 0   predniSONE  (DELTASONE ) 10 MG tablet, Take 4 tablets (40 mg total) by mouth daily with  breakfast for 7 days, THEN 3 tablets (30 mg total) daily with breakfast for 5 days, THEN 2 tablets (20 mg total) daily with breakfast for 5 days, THEN 1 tablet (10 mg total) daily with breakfast for 5 days., Disp: 58 tablet, Rfl: 0   rosuvastatin  (CRESTOR ) 40 MG tablet, Take 1 tablet (40 mg total) by mouth daily., Disp: 90 tablet, Rfl: 3   sertraline  (ZOLOFT ) 50 MG tablet, Take 1 tablet by mouth once daily, Disp: 90 tablet, Rfl: 0   vedolizumab  (ENTYVIO ) 300 MG injection, Induction Entyvio  300mg  at week 0,2, and 6 and then maintenance does every 8 weeks, Disp: 1 each, Rfl: 12   Family History  Problem Relation Age of Onset   Heart disease Mother    Heart attack Mother    Hypertension Mother    Varicose Veins Mother    Cerebrovascular Accident Father    Arthritis Father    Hypertension Father    Dementia Father    Breast cancer Sister    Cancer Sister    Breast cancer Sister 64   Cancer Sister    Cancer Sister    Hypertension Other    Diabetes Other    Heart attack Other 53   Mental illness Neg Hx      Social History   Tobacco Use   Smoking status: Never   Smokeless tobacco: Never  Vaping Use   Vaping status: Never Used  Substance Use Topics   Alcohol use: Never   Drug use: Never    Allergies as of 02/02/2024   (No Known Allergies)    Review of Systems:    All systems reviewed and negative except where noted in HPI.   Physical Exam:  BP (!) 151/68 (BP Location: Left Arm, Patient Position: Sitting, Cuff Size: Normal)   Pulse 66   Temp 98.7 F (37.1 C) (Oral)   Wt 143 lb (64.9 kg)   BMI 27.93 kg/m  No LMP recorded. Patient is postmenopausal.  General:   Alert,  Well-developed, well-nourished, pleasant and cooperative in NAD Head:  Normocephalic and atraumatic. Eyes:  Sclera clear, no icterus.   Conjunctiva pink. Ears:  Normal auditory acuity. Nose:  No deformity, discharge, or lesions. Mouth:  No deformity or lesions,oropharynx pink & moist. Neck:  Supple;  no masses or thyromegaly. Lungs:  Respirations even and unlabored.  Clear throughout to auscultation.   No wheezes, crackles, or rhonchi. No acute distress. Heart:  Regular rate and rhythm; no murmurs, clicks, rubs, or gallops. Abdomen:  Normal bowel sounds.  No bruits.  Soft, nontender, and non-distended without masses, hepatosplenomegaly or hernias noted.  No guarding or rebound tenderness.   Rectal: Nor performed Msk:  Symmetrical without gross deformities. Good, equal movement & strength bilaterally. Pulses:  Normal pulses noted. Extremities:  No clubbing or edema.  No cyanosis. Neurologic:  Alert and oriented x3;  grossly normal neurologically. Skin:  Intact without significant lesions or rashes. No jaundice. Psych:  Alert and cooperative. Normal mood and affect.  Imaging Studies: MRI reviewed  Assessment and Plan:   Carolyn Campbell is a 68 y.o. African-American female with Terminal ileal and colonic Crohn's, diagnosed in 2015 who was previously maintained on Imuran  50 mg daily with mild intermittent flareups about twice a year needing prednisone . She does not have any endoscopy evidence of ileocolonic Crohn's disease other than a few scattered aphthae in the ascending colon on colonoscopy in 05/2017 but histology revealed chronic mild active ileitis and right-sided colitis. MR enterography did not inflammation in small bowel. I tried to give her budesonide  but she could not afford high co-pay of $500 for 30 days supply.  She stopped Imuran  by herself in the past.  Recent flareup of her Crohn's disease with worsening of upper abdominal pain associated with nausea, unintentional weight loss, poor p.o. intake. Fecal calprotectin levels are elevated.  Discontinued Lialda , currently on azathioprine  50 mg daily.  She took prednisone  for 2 days only due to new onset of rash and itching.  Rash has resolved.  EGD was unremarkable.  Repeat colonoscopy revealed active Crohn's colitis, worse in the rectum  with mild symptoms.  Started on Entyvio  on 05/23/2019, which has resulted in clinical and histologic remission.  Stopped Entyvio  in late 2022, resulted in exacerbation of Crohn's in 08/2022, restarted with reinduction and maintenance in 09/2022  Crohn's disease of large intestine, nonstricturing, nonpenetrating Previously in clinical and histologic remission followed by exacerbation in 08/2022 secondary to self discontinuation of Entyvio  Colonoscopy in 09/2022 revealed moderate to severe proctitis Entyvio  has been reinitiated in 09/2022, resulted in clinical remission Fecal calprotectin levels were normal in 12/08/2022 and 07/10/2023 Her insurance denied to switch to injectable form of Entyvio   Recent exacerbation of Crohn's disease on 11/21/2023 and 01/2024 Stool studies negative for infection including C. difficile Significantly elevated fecal calprotectin levels 3150 and elevated CRP Mildly elevated LFTs which normalized based on labs on 01/13/2024 CT abdomen pelvis revealed right-sided colitis Initiated on prednisone  which resolved diarrhea, nausea, abdominal pain Interestingly, colonoscopy did not reveal any evidence of active colitis or ileitis CRP normalized Recommend video capsule endoscopy   Chronic GERD, well controlled EGD in 2020 was normal Currently not on PPI and asymptomatic  IBD Health Maintenance  1.TB status: Gold quantiferon negative 09/2019 2. Anemia: normal Hb, vitamin B12 and ferritin levels are normal 3.Immunizations: Hep A immune, hepatitis B immune, and annual influenza vaccine, prevnar received in 06/2017, pneumovax administered on 03/27/2019.  Received first dose of Shingrix  vaccine on 04/11/2019, and booster on 01/29/2021 4.Cancer screening I) Colon cancer/dysplasia surveillance: Current scope in 09/2022, no evidence of dysplasia and no precancerous polyps identified,  II) Cervical cancer: n/a III) Skin cancer - n/a  5.Bone health Vitamin D  status: Normal Bone density  testing: On 09/25/2020, revealed osteopenia, repeat  DEXA scan in 01/2023 revealed osteopenia Normal vitamin D  levels in the past 5. Labs: Every with every other infusion 6. Smoking: Never smoked 7. NSAIDs and Antibiotics use: Denies NSAID use and antibiotic use  Follow up in 3 months  Karma Oz, MD

## 2024-02-06 ENCOUNTER — Encounter: Payer: Medicare Other | Admitting: Physical Therapy

## 2024-02-08 ENCOUNTER — Encounter: Payer: Medicare Other | Admitting: Physical Therapy

## 2024-02-14 ENCOUNTER — Ambulatory Visit

## 2024-02-17 DIAGNOSIS — H26492 Other secondary cataract, left eye: Secondary | ICD-10-CM | POA: Diagnosis not present

## 2024-02-17 DIAGNOSIS — H401111 Primary open-angle glaucoma, right eye, mild stage: Secondary | ICD-10-CM | POA: Diagnosis not present

## 2024-02-17 DIAGNOSIS — Z961 Presence of intraocular lens: Secondary | ICD-10-CM | POA: Diagnosis not present

## 2024-02-20 ENCOUNTER — Encounter: Admission: RE | Payer: Self-pay | Source: Home / Self Care

## 2024-02-20 ENCOUNTER — Ambulatory Visit: Admission: RE | Admit: 2024-02-20 | Source: Home / Self Care | Admitting: Gastroenterology

## 2024-02-20 SURGERY — IMAGING PROCEDURE, GI TRACT, INTRALUMINAL, VIA CAPSULE

## 2024-02-24 DIAGNOSIS — H401132 Primary open-angle glaucoma, bilateral, moderate stage: Secondary | ICD-10-CM | POA: Diagnosis not present

## 2024-02-24 DIAGNOSIS — H26492 Other secondary cataract, left eye: Secondary | ICD-10-CM | POA: Diagnosis not present

## 2024-03-07 ENCOUNTER — Encounter: Payer: Self-pay | Admitting: Family Medicine

## 2024-03-07 DIAGNOSIS — I10 Essential (primary) hypertension: Secondary | ICD-10-CM

## 2024-03-07 MED ORDER — LOSARTAN POTASSIUM-HCTZ 50-12.5 MG PO TABS
1.0000 | ORAL_TABLET | Freq: Every day | ORAL | 0 refills | Status: DC
Start: 2024-03-07 — End: 2024-03-26

## 2024-03-13 ENCOUNTER — Ambulatory Visit: Admission: RE | Admit: 2024-03-13 | Source: Ambulatory Visit

## 2024-03-14 ENCOUNTER — Ambulatory Visit
Admission: RE | Admit: 2024-03-14 | Discharge: 2024-03-14 | Disposition: A | Discharge status: Home / Self Care | Source: Ambulatory Visit | Attending: Gastroenterology | Admitting: Gastroenterology

## 2024-03-14 DIAGNOSIS — K5010 Crohn's disease of large intestine without complications: Secondary | ICD-10-CM | POA: Diagnosis not present

## 2024-03-14 DIAGNOSIS — Z79620 Long term (current) use of immunosuppressive biologic: Secondary | ICD-10-CM | POA: Insufficient documentation

## 2024-03-14 MED ORDER — VEDOLIZUMAB 300 MG IV SOLR
300.0000 mg | Freq: Once | INTRAVENOUS | Status: AC
  Administered 2024-03-14: 300 mg via INTRAVENOUS
  Filled 2024-03-14: qty 5

## 2024-03-20 DIAGNOSIS — D509 Iron deficiency anemia, unspecified: Secondary | ICD-10-CM | POA: Diagnosis not present

## 2024-03-26 DIAGNOSIS — H04123 Dry eye syndrome of bilateral lacrimal glands: Secondary | ICD-10-CM | POA: Diagnosis not present

## 2024-03-26 DIAGNOSIS — Z961 Presence of intraocular lens: Secondary | ICD-10-CM | POA: Diagnosis not present

## 2024-03-26 DIAGNOSIS — H35371 Puckering of macula, right eye: Secondary | ICD-10-CM | POA: Diagnosis not present

## 2024-03-26 DIAGNOSIS — H401132 Primary open-angle glaucoma, bilateral, moderate stage: Secondary | ICD-10-CM | POA: Diagnosis not present

## 2024-03-26 DIAGNOSIS — H43813 Vitreous degeneration, bilateral: Secondary | ICD-10-CM | POA: Diagnosis not present

## 2024-03-26 DIAGNOSIS — H02831 Dermatochalasis of right upper eyelid: Secondary | ICD-10-CM | POA: Diagnosis not present

## 2024-03-26 DIAGNOSIS — H02834 Dermatochalasis of left upper eyelid: Secondary | ICD-10-CM | POA: Diagnosis not present

## 2024-03-27 ENCOUNTER — Encounter: Payer: Self-pay | Admitting: Family Medicine

## 2024-03-27 ENCOUNTER — Ambulatory Visit (INDEPENDENT_AMBULATORY_CARE_PROVIDER_SITE_OTHER): Payer: Self-pay | Admitting: Family Medicine

## 2024-03-27 VITALS — BP 122/68 | HR 67 | Resp 16 | Ht 60.0 in | Wt 147.0 lb

## 2024-03-27 DIAGNOSIS — D649 Anemia, unspecified: Secondary | ICD-10-CM | POA: Insufficient documentation

## 2024-03-27 DIAGNOSIS — F330 Major depressive disorder, recurrent, mild: Secondary | ICD-10-CM

## 2024-03-27 DIAGNOSIS — I69919 Unspecified symptoms and signs involving cognitive functions following unspecified cerebrovascular disease: Secondary | ICD-10-CM

## 2024-03-27 DIAGNOSIS — I69359 Hemiplegia and hemiparesis following cerebral infarction affecting unspecified side: Secondary | ICD-10-CM | POA: Diagnosis not present

## 2024-03-27 DIAGNOSIS — I700 Atherosclerosis of aorta: Secondary | ICD-10-CM

## 2024-03-27 DIAGNOSIS — M79671 Pain in right foot: Secondary | ICD-10-CM

## 2024-03-27 DIAGNOSIS — K5010 Crohn's disease of large intestine without complications: Secondary | ICD-10-CM | POA: Diagnosis not present

## 2024-03-27 DIAGNOSIS — I10 Essential (primary) hypertension: Secondary | ICD-10-CM | POA: Diagnosis not present

## 2024-03-27 DIAGNOSIS — I25118 Atherosclerotic heart disease of native coronary artery with other forms of angina pectoris: Secondary | ICD-10-CM

## 2024-03-27 DIAGNOSIS — E039 Hypothyroidism, unspecified: Secondary | ICD-10-CM | POA: Diagnosis not present

## 2024-03-27 DIAGNOSIS — Z79899 Other long term (current) drug therapy: Secondary | ICD-10-CM | POA: Diagnosis not present

## 2024-03-27 MED ORDER — LOSARTAN POTASSIUM-HCTZ 50-12.5 MG PO TABS: 1.0000 | ORAL_TABLET | Freq: Every day | ORAL | 1 refills | Status: AC

## 2024-03-27 MED ORDER — SERTRALINE HCL 50 MG PO TABS: 50.0000 mg | ORAL_TABLET | Freq: Every day | ORAL | 3 refills | Status: AC

## 2024-03-27 NOTE — Assessment & Plan Note (Signed)
 Stable sx, no recent changes, still following with neurology

## 2024-03-27 NOTE — Assessment & Plan Note (Signed)
 Seeing GI Dr. Unk for increase in sx

## 2024-03-27 NOTE — Assessment & Plan Note (Signed)
 Reviewed labs and OV with GI and hem/onc Last H/H improving near normal  Currently pending capsule study results with GI She has hem f/up as well

## 2024-03-27 NOTE — Progress Notes (Signed)
 Name: Carolyn Campbell   MRN: [id number]    DOB: 10-Sep-1956   Date:03/27/2024       Progress Note  Chief Complaint  Patient presents with   Medical Management of Chronic Issues   Hypertension   Depression     Subjective:   Carolyn Campbell is a 68 y.o. female, presents to clinic for routine follow up on chronic conditions  Hypertension:  BP at goal today BP Readings from Last 3 Encounters:  03/27/24 122/68  02/02/24 (!) 151/68  01/13/24 (!) 112/56   Pt denies CP, SOB, exertional sx, LE edema, palpitation, Ha's, visual disturbances, lightheadedness, hypotension, syncope.    HLD on statin, labs checked 6 months ago, no changes since then  Lab Results  Component Value Date   CHOL 205 (H) 09/28/2023   HDL 102 09/28/2023   LDLCALC 82 09/28/2023   TRIG 109 09/28/2023   CHOLHDL 2.0 09/28/2023   Zoloft  50 mg daily    03/27/2024    1:21 PM 09/28/2023    3:07 PM 05/09/2023   11:24 AM  Depression screen PHQ 2/9  Decreased Interest 0 0 0  Down, Depressed, Hopeless 0 0 0  PHQ - 2 Score 0 0 0  Altered sleeping 0 0 0  Tired, decreased energy 0 0 0  Change in appetite 1 0 0  Feeling bad or failure about yourself  0 0 0  Trouble concentrating 0 0 0  Moving slowly or fidgety/restless 0 0 0  Suicidal thoughts 0 0 0  PHQ-9 Score 1 0 0  Difficult doing work/chores  Not difficult at all Not difficult at all      08/18/2022    4:59 PM 06/03/2022    1:24 PM 04/22/2022    1:08 PM 01/29/2021    3:15 PM  GAD 7 : Generalized Anxiety Score  Nervous, Anxious, on Edge    0  Control/stop worrying    0  Worry too much - different things    0  Trouble relaxing    0  Restless    0  Easily annoyed or irritable    0  Afraid - awful might happen    0  Total GAD 7 Score    0  Anxiety Difficulty    Not difficult at all     Information is confidential and restricted. Go to Review Flowsheets to unlock data.           Current Outpatient Medications:    albuterol  (VENTOLIN  HFA) 108 (90  Base) MCG/ACT inhaler, Inhale 2 puffs into the lungs every 6 (six) hours as needed for wheezing or shortness of breath (chest tightness)., Disp: 8 g, Rfl: 0   brimonidine (ALPHAGAN) 0.2 % ophthalmic solution, Place 1 drop into both eyes in the morning and at bedtime., Disp: , Rfl:    ELIQUIS  5 MG TABS tablet, TAKE ONE TABLET BY MOUTH TWICE A DAY, Disp: 180 tablet, Rfl: 3   gabapentin  (NEURONTIN ) 300 MG capsule, 1 po qHS x 4 days, then bid x 4 days, then tid, Disp: , Rfl:    latanoprost  (XALATAN ) 0.005 % ophthalmic solution, Place 1 drop into both eyes at bedtime. , Disp: , Rfl:    levothyroxine  (SYNTHROID ) 50 MCG tablet, TAKE ONE TABLET BY MOUTH BEFORE BREAKFAST FOR 5 DAYS, AND TAKE TWO  TABLETS BEFORE BREAKFAST FOR 2 DAYS, Disp: 110 tablet, Rfl: 1   losartan -hydrochlorothiazide  (HYZAAR) 50-12.5 MG tablet, Take 1 tablet by mouth daily., Disp: 90 tablet, Rfl: 0  Multiple Vitamin (MULTIVITAMIN) tablet, Take 1 tablet by mouth daily., Disp: , Rfl:    rosuvastatin  (CRESTOR ) 40 MG tablet, Take 1 tablet (40 mg total) by mouth daily., Disp: 90 tablet, Rfl: 3   sertraline  (ZOLOFT ) 50 MG tablet, Take 1 tablet by mouth once daily, Disp: 90 tablet, Rfl: 0   vedolizumab  (ENTYVIO ) 300 MG injection, Induction Entyvio  300mg  at week 0,2, and 6 and then maintenance does every 8 weeks, Disp: 1 each, Rfl: 12  Patient Active Problem List   Diagnosis Date Noted   REM behavioral disorder 03/14/2023   Excessive daytime sleepiness 03/14/2023   Crohn's disease of large intestine without complication (HCC) 09/30/2022   Convulsions, unspecified convulsion type (HCC) 09/24/2022   Encounter for current long-term use of anticoagulants 09/24/2022   MDD (major depressive disorder), recurrent episode, mild (HCC) 08/18/2022   Restless leg syndrome 08/18/2022   Pulmonary embolism (HCC) 07/16/2022   Pain in limb 11/10/2021   CAD (coronary artery disease) 08/06/2021   Glaucoma 02/23/2021   Other long term (current) drug therapy  01/22/2021   Varicose veins of both lower extremities with pain 07/19/2018   Arthritis of right hand 12/21/2017   Major depression in remission (HCC) 12/21/2017   Fibrocystic breast changes 06/22/2017   Osteopenia 10/18/2016   Atherosclerosis of aorta (HCC) 12/19/2015   Vitreous degeneration 05/21/2015   Glaucoma associated with chamber angle anomalies 05/21/2015   H/O malignant neoplasm of breast 05/21/2015   Solitary pulmonary nodule 11/07/2012   Cognitive deficits as late effect of cerebrovascular disease 01/20/2010   CVA, old, hemiparesis (HCC) 04/28/2009   Acquired hypothyroidism 06/12/2008   HLD (hyperlipidemia) 01/18/2007   Primary hypertension 01/18/2007    Past Surgical History:  Procedure Laterality Date   BREAST BIOPSY Left 2006   POS   BREAST LUMPECTOMY Left 09/20/2004   positive   BREAST SURGERY Left    malignant biopsy   CARDIAC CATHETERIZATION     COLONOSCOPY  2015   COLONOSCOPY N/A 01/09/2024   Procedure: COLONOSCOPY;  Surgeon: Unk Corinn Skiff, MD;  Location: [facility];  Service: Endoscopy;  Laterality: N/A;   COLONOSCOPY WITH PROPOFOL  N/A 06/08/2017   Procedure: COLONOSCOPY WITH PROPOFOL ;  Surgeon: Unk Corinn Skiff, MD;  Location: [facility];  Service: Gastroenterology;  Laterality: N/A;   COLONOSCOPY WITH PROPOFOL  N/A 03/08/2019   Procedure: COLONOSCOPY WITH PROPOFOL ;  Surgeon: Unk Corinn Skiff, MD;  Location: [facility] END[facility];  Service: Gastroenterology;  Laterality: N/A;   COLONOSCOPY WITH PROPOFOL  N/A 07/16/2020   Procedure: COLONOSCOPY WITH PROPOFOL ;  Surgeon: Unk Corinn Skiff, MD;  Location: [facility] ENDOSCOPY;  Service: Gastroenterology;  Laterality: N/A;   COLONOSCOPY WITH PROPOFOL  N/A 09/30/2022   Procedure: COLONOSCOPY WITH PROPOFOL ;  Surgeon: Unk Corinn Skiff, MD;  Location: [facility] ENDOSCOPY;  Service: Gastroenterology;  Laterality: N/A;   ESOPHAGOGASTRODUODENOSCOPY  2015   ESOPHAGOGASTRODUODENOSCOPY (EGD) WITH PROPOFOL  N/A  06/08/2017   Procedure: ESOPHAGOGASTRODUODENOSCOPY (EGD) WITH PROPOFOL ;  Surgeon: Unk Corinn Skiff, MD;  Location: [facility];  Service: Gastroenterology;  Laterality: N/A;   ESOPHAGOGASTRODUODENOSCOPY (EGD) WITH PROPOFOL  N/A 03/08/2019   Procedure: ESOPHAGOGASTRODUODENOSCOPY (EGD) WITH PROPOFOL ;  Surgeon: Unk Corinn Skiff, MD;  Location: [facility] ENDOSCOPY;  Service: Gastroenterology;  Laterality: N/A;   EYE SURGERY     FINGER SURGERY     Right small finger   FRACTURE SURGERY     GIVENS CAPSULE STUDY  2015   TUBAL LIGATION      Family History  Problem Relation Age of Onset   Heart disease Mother  Heart attack Mother    Hypertension Mother    Varicose Veins Mother    Cerebrovascular Accident Father    Arthritis Father    Hypertension Father    Dementia Father    Breast cancer Sister    Cancer Sister    Breast cancer Sister 8   Cancer Sister    Cancer Sister    Hypertension Other    Diabetes Other    Heart attack Other 49   Mental illness Neg Hx     Social History   Tobacco Use   Smoking status: Never   Smokeless tobacco: Never  Vaping Use   Vaping status: Never Used  Substance Use Topics   Alcohol use: Never   Drug use: Never     No Known Allergies  Health Maintenance  Topic Date Due   Medicare Annual Wellness (AWV)  02/04/2024   COVID-19 Vaccine (8 - 2024-25 season) 04/11/2024 (Originally 05/22/2023)   Hepatitis B Vaccines (3 of 3 - 19+ 3-dose series) 03/26/2025 (Originally 06/06/2019)   INFLUENZA VACCINE  04/20/2024   MAMMOGRAM  07/26/2024   DEXA SCAN  01/30/2025   Colonoscopy  01/08/2029   Pneumococcal Vaccine: 50+ Years  Completed   Hepatitis C Screening  Completed   Zoster Vaccines- Shingrix   Completed   HPV VACCINES  Aged Out   Meningococcal B Vaccine  Aged Out   DTaP/Tdap/Td  Discontinued    Chart Review Today: I personally reviewed active problem list, medication list, allergies, family history, social history, health maintenance,  notes from last encounter, lab results, imaging with the patient/caregiver today.   Review of Systems  Constitutional: Negative.   HENT: Negative.    Eyes: Negative.   Respiratory: Negative.    Cardiovascular: Negative.   Gastrointestinal: Negative.   Endocrine: Negative.   Genitourinary: Negative.   Musculoskeletal: Negative.   Skin: Negative.   Allergic/Immunologic: Negative.   Neurological: Negative.   Hematological: Negative.   Psychiatric/Behavioral: Negative.    All other systems reviewed and are negative.    Objective:   Vitals:   03/27/24 1316  BP: 122/68  Pulse: 67  Resp: 16  SpO2: 96%  Weight: 147 lb (66.7 kg)  Height: 5' (1.524 m)    Body mass index is 28.71 kg/m.  Physical Exam Vitals and nursing note reviewed.  Constitutional:      General: She is not in acute distress.    Appearance: Normal appearance. She is well-developed. She is not ill-appearing, toxic-appearing or diaphoretic.  HENT:     Head: Normocephalic and atraumatic.     Right Ear: External ear normal.     Left Ear: External ear normal.     Nose: Nose normal.  Eyes:     General: No scleral icterus.       Right eye: No discharge.        Left eye: No discharge.     Conjunctiva/sclera: Conjunctivae normal.  Neck:     Trachea: No tracheal deviation.  Cardiovascular:     Rate and Rhythm: Normal rate.     Pulses: Normal pulses.     Heart sounds: Normal heart sounds.  Pulmonary:     Effort: Pulmonary effort is normal. No respiratory distress.     Breath sounds: Normal breath sounds. No stridor.  Feet:     Right foot:     Skin integrity: Skin integrity normal. No erythema, warmth or callus.     Toenail Condition: Right toenails are normal.     Comments: Right 1st  MTP bump w/o ttp or erythema  Skin:    General: Skin is warm and dry.     Findings: No rash.  Neurological:     Mental Status: She is alert.     Motor: No abnormal muscle tone.     Coordination: Coordination normal.      Gait: Gait normal.  Psychiatric:        Mood and Affect: Mood normal.        Behavior: Behavior normal.            Assessment & Plan:   Atherosclerosis of aorta (HCC) -     Comprehensive metabolic panel with GFR  Acquired hypothyroidism Assessment & Plan: Due for recheck of TSH Med dose adjusted last year and pt appears euthyroid, she has no concerns or sx  On levothyroxine  50 mcg daily She notes other memory/cognitive concerns- in light of hypothyroid if TSH is elevated med dose would be adjusted and it could potentially be related to her sx  Lab Results  Component Value Date   TSH 1.12 09/28/2023   TSH 2.85 05/09/2023   TSH 0.12 (L) 03/28/2023   TSH 0.64 12/10/2022   TSH 0.35 (L) 09/21/2022     Orders: -     TSH  Primary hypertension Assessment & Plan: On lisinopril-HCTZ 50-12.5 mg daily, good compliance, no SE or concerns today Will recheck CMP - renal function has been great she's had some low potassium intermittently For now no dose changes BP at goal and HTN stable and well controlled with current med and diet/lifestyle efforts BP Readings from Last 3 Encounters:  03/27/24 122/68  02/02/24 (!) 151/68  01/13/24 (!) 112/56     Orders: -     Losartan  Potassium-HCTZ; Take 1 tablet by mouth daily.  Dispense: 90 tablet; Refill: 1  Coronary artery disease involving native coronary artery of native heart with other form of angina pectoris (HCC) -     Comprehensive metabolic panel with GFR  Crohn's disease of large intestine without complication (HCC) Assessment & Plan: Seeing GI Dr. Unk for increase in sx   CVA, old, hemiparesis (HCC) Assessment & Plan: Stable sx, no recent changes, still following with neurology    MDD (major depressive disorder), recurrent episode, mild (HCC) Assessment & Plan: She reports mood and symptoms are currently well controlled with zoloft  50 mg daily, despite ongoing stressors. She would like to keep same med dose Phq  and gad 7 reviewed today    03/27/2024    1:21 PM 09/28/2023    3:07 PM 05/09/2023   11:24 AM  Depression screen PHQ 2/9  Decreased Interest 0 0 0  Down, Depressed, Hopeless 0 0 0  PHQ - 2 Score 0 0 0  Altered sleeping 0 0 0  Tired, decreased energy 0 0 0  Change in appetite 1 0 0  Feeling bad or failure about yourself  0 0 0  Trouble concentrating 0 0 0  Moving slowly or fidgety/restless 0 0 0  Suicidal thoughts 0 0 0  PHQ-9 Score 1 0 0  Difficult doing work/chores  Not difficult at all Not difficult at all     Orders: -     Sertraline  HCl; Take 1 tablet (50 mg total) by mouth daily.  Dispense: 90 tablet; Refill: 3  Right foot pain -     Ambulatory referral to Podiatry  Other long term (current) drug therapy Assessment & Plan: On long term anticoagulant due to recurrent PE   Cognitive  deficits as late effect of cerebrovascular disease Assessment & Plan: She is scheduled for f/up with neurology for reassessment   Anemia, unspecified type Assessment & Plan: Reviewed labs and OV with GI and hem/onc Last H/H improving near normal  Currently pending capsule study results with GI She has hem f/up as well       Return in about 6 months (around 09/27/2024) for Annual Physical/MWV .   Michelene Cower, PA-C 03/27/24 1:30 PM

## 2024-03-27 NOTE — Assessment & Plan Note (Signed)
 She reports mood and symptoms are currently well controlled with zoloft  50 mg daily, despite ongoing stressors. She would like to keep same med dose Phq and gad 7 reviewed today    03/27/2024    1:21 PM 09/28/2023    3:07 PM 05/09/2023   11:24 AM  Depression screen PHQ 2/9  Decreased Interest 0 0 0  Down, Depressed, Hopeless 0 0 0  PHQ - 2 Score 0 0 0  Altered sleeping 0 0 0  Tired, decreased energy 0 0 0  Change in appetite 1 0 0  Feeling bad or failure about yourself  0 0 0  Trouble concentrating 0 0 0  Moving slowly or fidgety/restless 0 0 0  Suicidal thoughts 0 0 0  PHQ-9 Score 1 0 0  Difficult doing work/chores  Not difficult at all Not difficult at all

## 2024-03-27 NOTE — Assessment & Plan Note (Signed)
 She is scheduled for f/up with neurology for reassessment

## 2024-03-27 NOTE — Assessment & Plan Note (Signed)
 Due for recheck of TSH Med dose adjusted last year and pt appears euthyroid, she has no concerns or sx  On levothyroxine  50 mcg daily She notes other memory/cognitive concerns- in light of hypothyroid if TSH is elevated med dose would be adjusted and it could potentially be related to her sx  Lab Results  Component Value Date   TSH 1.12 09/28/2023   TSH 2.85 05/09/2023   TSH 0.12 (L) 03/28/2023   TSH 0.64 12/10/2022   TSH 0.35 (L) 09/21/2022

## 2024-03-27 NOTE — Assessment & Plan Note (Signed)
 On lisinopril-HCTZ 50-12.5 mg daily, good compliance, no SE or concerns today Will recheck CMP - renal function has been great she's had some low potassium intermittently For now no dose changes BP at goal and HTN stable and well controlled with current med and diet/lifestyle efforts BP Readings from Last 3 Encounters:  03/27/24 122/68  02/02/24 (!) 151/68  01/13/24 (!) 112/56

## 2024-03-27 NOTE — Assessment & Plan Note (Signed)
 On long term anticoagulant due to recurrent PE

## 2024-03-28 ENCOUNTER — Ambulatory Visit: Payer: Self-pay | Admitting: Family Medicine

## 2024-03-28 LAB — COMPREHENSIVE METABOLIC PANEL WITH GFR
AG Ratio: 1.7 (calc) (ref 1.0–2.5)
ALT: 17 U/L (ref 6–29)
AST: 20 U/L (ref 10–35)
Albumin: 4.8 g/dL (ref 3.6–5.1)
Alkaline phosphatase (APISO): 68 U/L (ref 37–153)
BUN: 13 mg/dL (ref 7–25)
CO2: 30 mmol/L (ref 20–32)
Calcium: 11.7 mg/dL — ABNORMAL HIGH (ref 8.6–10.4)
Chloride: 99 mmol/L (ref 98–110)
Creat: 0.88 mg/dL (ref 0.60–1.00)
Globulin: 2.8 g/dL (ref 1.9–3.7)
Glucose, Bld: 85 mg/dL (ref 65–99)
Potassium: 4.2 mmol/L (ref 3.5–5.3)
Sodium: 140 mmol/L (ref 135–146)
Total Bilirubin: 0.8 mg/dL (ref 0.2–1.2)
Total Protein: 7.6 g/dL (ref 6.1–8.1)
eGFR: 71 mL/min/1.73m2 (ref >=60)

## 2024-03-28 LAB — TSH: TSH: 1.31 m[IU]/L (ref 0.40–4.50)

## 2024-04-04 ENCOUNTER — Encounter: Payer: Self-pay | Admitting: Family Medicine

## 2024-04-04 ENCOUNTER — Ambulatory Visit (INDEPENDENT_AMBULATORY_CARE_PROVIDER_SITE_OTHER): Admitting: Podiatry

## 2024-04-04 ENCOUNTER — Ambulatory Visit (INDEPENDENT_AMBULATORY_CARE_PROVIDER_SITE_OTHER)

## 2024-04-04 VITALS — Ht 60.0 in | Wt 147.0 lb

## 2024-04-04 DIAGNOSIS — M7751 Other enthesopathy of right foot: Secondary | ICD-10-CM

## 2024-04-04 DIAGNOSIS — M2011 Hallux valgus (acquired), right foot: Secondary | ICD-10-CM | POA: Diagnosis not present

## 2024-04-04 DIAGNOSIS — D1723 Benign lipomatous neoplasm of skin and subcutaneous tissue of right leg: Secondary | ICD-10-CM | POA: Diagnosis not present

## 2024-04-04 DIAGNOSIS — M21611 Bunion of right foot: Secondary | ICD-10-CM | POA: Diagnosis not present

## 2024-04-04 MED ORDER — DICLOFENAC SODIUM 1 % EX GEL: 4.0000 g | Freq: Four times a day (QID) | CUTANEOUS | 4 refills | Status: AC

## 2024-04-05 NOTE — Progress Notes (Signed)
  Subjective:  Patient ID: Carolyn Campbell, female    DOB: 1956-02-25,  MRN: [id number]  Chief Complaint  Patient presents with   Foot Pain    Rm 1 Patient is here for right pain that present two months ago. Patient state pain is on the interior side of the foot. Pain is most intense with prolonged walking and standing. Patient is also concerned with swelling of the ankles.     68 y.o. female presents with the above complaint. History confirmed with patient.  Seems to be worse with certain shoes  Objective:  Physical Exam: warm, good capillary refill, no trophic changes or ulcerative lesions, normal DP and PT pulses, and normal sensory exam. Left Foot: normal exam, no swelling, tenderness, instability; ligaments intact, full range of motion of all ankle/foot joints Right Foot: bunion deformity noted, mild but has prominent medial eminence, minimal deviation of hallux  No images are attached to the encounter.  Radiographs: Multiple views x-ray of the right foot: no fracture, dislocation, swelling or degenerative changes noted and medial bunion first MTP joint Assessment:   1. Capsulitis of metatarsophalangeal (MTP) joint of right foot   2. Hallux valgus with bunions, right   3. Lipoma of right lower extremity      Plan:  Patient was evaluated and treated and all questions answered.  Discussed etiology treatment options of bunion deformity and inflammation of the first MTP joint due to this we discussed surgical nonsurgical treatment options I recommended Voltaren  gel sent Rx for this and recommend she use a bunion shield she will purchase OTC.  We also discussed appropriate shoe gear.  She also inquired about an area of swelling around the posterior peroneal's on the right ankle this peers to be consistent with a benign lipoma that is asymptomatic.  Would not recommend removal at at this point.  Return if symptoms worsen or fail to improve.

## 2024-04-13 ENCOUNTER — Ambulatory Visit: Payer: Self-pay

## 2024-04-13 NOTE — Telephone Encounter (Signed)
  Attempted to contact Carolyn Campbell at [facility] regarding question below.  Was on hold for 10 minutes, left before talking  [phone]  Reason for Disposition  [1] Other NON-URGENT information for PCP AND [2] does not require PCP response  Answer Assessment - Initial Assessment Questions 1. REASON FOR CALL or QUESTION: What is your reason for calling today? or How can I best  Attempted to return call to [facility] at [facility]. This nurse was on hold for 10 minutes.  Protocols used: PCP Call - No Triage-A-AH

## 2024-04-23 DIAGNOSIS — C50112 Malignant neoplasm of central portion of left female breast: Secondary | ICD-10-CM | POA: Diagnosis not present

## 2024-04-23 DIAGNOSIS — Z4432 Encounter for fitting and adjustment of external left breast prosthesis: Secondary | ICD-10-CM | POA: Diagnosis not present

## 2024-04-23 DIAGNOSIS — Z9012 Acquired absence of left breast and nipple: Secondary | ICD-10-CM | POA: Diagnosis not present

## 2024-04-24 ENCOUNTER — Ambulatory Visit
Admission: RE | Admit: 2024-04-24 | Discharge: 2024-04-24 | Disposition: A | Discharge status: Home / Self Care | Source: Ambulatory Visit | Attending: Gastroenterology | Admitting: Gastroenterology

## 2024-04-24 DIAGNOSIS — K5010 Crohn's disease of large intestine without complications: Secondary | ICD-10-CM | POA: Insufficient documentation

## 2024-04-24 DIAGNOSIS — K50118 Crohn's disease of large intestine with other complication: Secondary | ICD-10-CM | POA: Diagnosis present

## 2024-04-24 MED ORDER — VEDOLIZUMAB 300 MG IV SOLR
300.0000 mg | Freq: Once | INTRAVENOUS | Status: AC
  Administered 2024-04-24: 300 mg via INTRAVENOUS
  Filled 2024-04-24: qty 5

## 2024-04-30 DIAGNOSIS — K5080 Crohn's disease of both small and large intestine without complications: Secondary | ICD-10-CM | POA: Diagnosis not present

## 2024-05-04 DIAGNOSIS — K5080 Crohn's disease of both small and large intestine without complications: Secondary | ICD-10-CM | POA: Diagnosis not present

## 2024-05-05 LAB — COMPREHENSIVE METABOLIC PANEL WITH GFR
AG Ratio: 1.7 (calc) (ref 1.0–2.5)
ALT: 9 U/L (ref 6–29)
AST: 13 U/L (ref 10–35)
Albumin: 4.3 g/dL (ref 3.6–5.1)
Alkaline phosphatase (APISO): 66 U/L (ref 37–153)
BUN: 19 mg/dL (ref 7–25)
CO2: 30 mmol/L (ref 20–32)
Calcium: 10.4 mg/dL (ref 8.6–10.4)
Chloride: 102 mmol/L (ref 98–110)
Creat: 0.88 mg/dL (ref 0.60–1.00)
Globulin: 2.5 g/dL (ref 1.9–3.7)
Glucose, Bld: 88 mg/dL (ref 65–99)
Potassium: 3.8 mmol/L (ref 3.5–5.3)
Sodium: 141 mmol/L (ref 135–146)
Total Bilirubin: 0.5 mg/dL (ref 0.2–1.2)
Total Protein: 6.8 g/dL (ref 6.1–8.1)
eGFR: 71 mL/min/1.73m2 (ref >=60)

## 2024-05-05 LAB — PARATHYROID HORMONE, INTACT (NO CA): PTH: 23 pg/mL (ref 16–77)

## 2024-05-05 LAB — VITAMIN D 25 HYDROXY (VIT D DEFICIENCY, FRACTURES): Vit D, 25-Hydroxy: 75 ng/mL (ref 30–100)

## 2024-05-17 DIAGNOSIS — R262 Difficulty in walking, not elsewhere classified: Secondary | ICD-10-CM | POA: Diagnosis not present

## 2024-05-17 DIAGNOSIS — R278 Other lack of coordination: Secondary | ICD-10-CM | POA: Diagnosis not present

## 2024-05-24 DIAGNOSIS — R278 Other lack of coordination: Secondary | ICD-10-CM | POA: Diagnosis not present

## 2024-05-24 DIAGNOSIS — R262 Difficulty in walking, not elsewhere classified: Secondary | ICD-10-CM | POA: Diagnosis not present

## 2024-05-28 DIAGNOSIS — R262 Difficulty in walking, not elsewhere classified: Secondary | ICD-10-CM | POA: Diagnosis not present

## 2024-05-28 DIAGNOSIS — R278 Other lack of coordination: Secondary | ICD-10-CM | POA: Diagnosis not present

## 2024-05-29 ENCOUNTER — Telehealth: Payer: Self-pay | Admitting: Pharmacy Technician

## 2024-05-29 DIAGNOSIS — H903 Sensorineural hearing loss, bilateral: Secondary | ICD-10-CM | POA: Diagnosis not present

## 2024-05-29 NOTE — Telephone Encounter (Addendum)
 Auth Submission: APPROVED Site of care: Site of care: [facility] Payer: [employer] Medication & CPT/J Code(s) submitted: Entyvio  (Vedolizumab ) J3380 Diagnosis Code: K50.10 Route of submission (phone, fax, portal): PORTAL Phone # Fax # Auth type: Buy/Bill HB Units/visits requested: 300MG  [id number] Reference number: [id number] Approval from: 05/28/24 to 05/27/25

## 2024-05-30 DIAGNOSIS — H35371 Puckering of macula, right eye: Secondary | ICD-10-CM | POA: Diagnosis not present

## 2024-05-30 DIAGNOSIS — H02831 Dermatochalasis of right upper eyelid: Secondary | ICD-10-CM | POA: Diagnosis not present

## 2024-05-30 DIAGNOSIS — H43813 Vitreous degeneration, bilateral: Secondary | ICD-10-CM | POA: Diagnosis not present

## 2024-05-30 DIAGNOSIS — Z961 Presence of intraocular lens: Secondary | ICD-10-CM | POA: Diagnosis not present

## 2024-05-30 DIAGNOSIS — H02834 Dermatochalasis of left upper eyelid: Secondary | ICD-10-CM | POA: Diagnosis not present

## 2024-05-30 DIAGNOSIS — H04123 Dry eye syndrome of bilateral lacrimal glands: Secondary | ICD-10-CM | POA: Diagnosis not present

## 2024-05-30 DIAGNOSIS — H401132 Primary open-angle glaucoma, bilateral, moderate stage: Secondary | ICD-10-CM | POA: Diagnosis not present

## 2024-05-31 DIAGNOSIS — R262 Difficulty in walking, not elsewhere classified: Secondary | ICD-10-CM | POA: Diagnosis not present

## 2024-05-31 DIAGNOSIS — R278 Other lack of coordination: Secondary | ICD-10-CM | POA: Diagnosis not present

## 2024-06-01 ENCOUNTER — Other Ambulatory Visit: Payer: Self-pay | Admitting: Family Medicine

## 2024-06-01 DIAGNOSIS — E039 Hypothyroidism, unspecified: Secondary | ICD-10-CM

## 2024-06-04 NOTE — Telephone Encounter (Signed)
 Requested Prescriptions  Pending Prescriptions Disp Refills   levothyroxine  (SYNTHROID ) 50 MCG tablet [Pharmacy Med Name: LEVOTHYROXINE   NA 50MCG TAB] 110 tablet 1    Sig: TAKE ONE TABLET BY MOUTH BEFORE BREAKFAST FOR 5 DAYS, AND TAKE TWO  TABLETS BEFORE BREAKFAST FOR 2 DAYS A WEEK     Endocrinology:  Hypothyroid Agents Passed - 06/04/2024  9:56 AM      Passed - TSH in normal range and within 360 days    TSH  Date Value Ref Range Status  03/27/2024 1.31 0.40 - 4.50 mIU/L Final         Passed - Valid encounter within last 12 months    Recent Outpatient Visits           2 months ago Atherosclerosis of aorta [facility])   [place] [facility] Leavy Mole, PA-C   7 months ago Neck pain on right side   [facility] Leavy Mole, [place]       Future Appointments             In 3 months Leavy Mole, PA-C [facility], [place]

## 2024-06-05 ENCOUNTER — Inpatient Hospital Stay: Admission: RE | Admit: 2024-06-05 | Source: Ambulatory Visit

## 2024-06-08 ENCOUNTER — Ambulatory Visit
Admission: RE | Admit: 2024-06-08 | Discharge: 2024-06-08 | Disposition: A | Discharge status: Home / Self Care | Source: Ambulatory Visit | Attending: Gastroenterology | Admitting: Gastroenterology

## 2024-06-08 ENCOUNTER — Encounter

## 2024-06-08 VITALS — BP 125/48 | HR 64 | Temp 98.4°F | Resp 18

## 2024-06-08 DIAGNOSIS — K5010 Crohn's disease of large intestine without complications: Secondary | ICD-10-CM | POA: Insufficient documentation

## 2024-06-08 MED ORDER — SODIUM CHLORIDE 0.9 % IV SOLN: INTRAVENOUS | Status: DC

## 2024-06-08 MED ORDER — VEDOLIZUMAB 300 MG IV SOLR
300.0000 mg | Freq: Once | INTRAVENOUS | Status: AC
  Administered 2024-06-08: 300 mg via INTRAVENOUS
  Filled 2024-06-08: qty 5

## 2024-06-11 DIAGNOSIS — R278 Other lack of coordination: Secondary | ICD-10-CM | POA: Diagnosis not present

## 2024-06-11 DIAGNOSIS — R262 Difficulty in walking, not elsewhere classified: Secondary | ICD-10-CM | POA: Diagnosis not present

## 2024-07-05 ENCOUNTER — Ambulatory Visit (INDEPENDENT_AMBULATORY_CARE_PROVIDER_SITE_OTHER)

## 2024-07-05 DIAGNOSIS — Z0000 Encounter for general adult medical examination without abnormal findings: Secondary | ICD-10-CM | POA: Diagnosis not present

## 2024-07-05 DIAGNOSIS — Z1231 Encounter for screening mammogram for malignant neoplasm of breast: Secondary | ICD-10-CM

## 2024-07-05 NOTE — Progress Notes (Signed)
 Subjective:   Carolyn Campbell is a 68 y.o. who presents for a Medicare Wellness preventive visit.  As a reminder, Annual Wellness Visits don't include a physical exam, and some assessments may be limited, especially if this visit is performed virtually. We may recommend an in-person follow-up visit with your provider if needed.  Visit Complete: Virtual I connected with  Carolyn Campbell on 07/05/24 by a audio enabled telemedicine application and verified that I am speaking with the correct person using two identifiers.  Patient Location: Home  Provider Location: Home Office  I discussed the limitations of evaluation and management by telemedicine. The patient expressed understanding and agreed to proceed.  Vital Signs: Because this visit was a virtual/telehealth visit, some criteria may be missing or patient reported. Any vitals not documented were not able to be obtained and vitals that have been documented are patient reported.  VideoDeclined- This patient declined Librarian, academic. Therefore the visit was completed with audio only.  Persons Participating in Visit: Patient.  AWV Questionnaire: No: Patient Medicare AWV questionnaire was not completed prior to this visit.  Cardiac Risk Factors include: advanced age (>83men, >89 women);dyslipidemia;hypertension;sedentary lifestyle     Objective:    There were no vitals filed for this visit. There is no height or weight on file to calculate BMI.     07/05/2024    1:28 PM 01/13/2024   11:12 AM 01/13/2024   10:59 AM 01/09/2024    6:43 AM 11/23/2023    1:57 PM 10/05/2023   10:48 AM 06/07/2023   10:23 AM  Advanced Directives  Does Patient Have a Medical Advance Directive? No No No No No No No  Would patient like information on creating a medical advance directive? No - Patient declined   No - Patient declined No - Patient declined      Current Medications (verified) Outpatient Encounter Medications as of  07/05/2024  Medication Sig   albuterol  (VENTOLIN  HFA) 108 (90 Base) MCG/ACT inhaler Inhale 2 puffs into the lungs every 6 (six) hours as needed for wheezing or shortness of breath (chest tightness).   brimonidine (ALPHAGAN) 0.2 % ophthalmic solution Place 1 drop into both eyes in the morning and at bedtime.   diclofenac  Sodium (VOLTAREN ) 1 % GEL Apply 4 g topically 4 (four) times daily.   ELIQUIS  5 MG TABS tablet TAKE ONE TABLET BY MOUTH TWICE A DAY   gabapentin  (NEURONTIN ) 300 MG capsule 1 po qHS x 4 days, then bid x 4 days, then tid   latanoprost  (XALATAN ) 0.005 % ophthalmic solution Place 1 drop into both eyes at bedtime.    levothyroxine  (SYNTHROID ) 50 MCG tablet TAKE ONE TABLET BY MOUTH BEFORE BREAKFAST FOR 5 DAYS, AND TAKE TWO  TABLETS BEFORE BREAKFAST FOR 2 DAYS A WEEK   losartan -hydrochlorothiazide  (HYZAAR) 50-12.5 MG tablet Take 1 tablet by mouth daily.   Multiple Vitamin (MULTIVITAMIN) tablet Take 1 tablet by mouth daily.   rosuvastatin  (CRESTOR ) 40 MG tablet Take 1 tablet (40 mg total) by mouth daily.   sertraline  (ZOLOFT ) 50 MG tablet Take 1 tablet (50 mg total) by mouth daily.   vedolizumab  (ENTYVIO ) 300 MG injection Induction Entyvio  300mg  at week 0,2, and 6 and then maintenance does every 8 weeks   No facility-administered encounter medications on file as of 07/05/2024.    Allergies (verified) Patient has no known allergies.   History: Past Medical History:  Diagnosis Date   Abdominal pain, epigastric    Acute metabolic encephalopathy  07/22/2022   Allergic rhinitis    Anemia 03/27/2024   Arthritis    Asthma    Breast cancer (HCC) 2006   LT LUMPECTOMY   Cataract    Clotting disorder    Cognitive deficits as late effect of cerebrovascular disease    Convulsions, unspecified convulsion type (HCC) 09/24/2022   COPD (chronic obstructive pulmonary disease) (HCC)    COVID-19 virus infection 04/2021   Crohn's disease (HCC) 05/21/2015   FOLLOWED BY GI   Crohn's disease of  both small and large intestine with rectal bleeding (HCC) 12/04/2014   Depression    Currently taking zoloft .   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 05/2021 Echo: EF 60-65%, no rwma, nl RV fxn.   History of kidney stones    Hypercholesterolemia 01/18/2007   Hyperlipidemia    Hypertension    Hypothyroidism    Low back pain radiating to left leg 06/17/2021   Nonobstructive CAD (coronary artery disease)    a. 10/2012 Cath: diffuse minor irregs-->med rx; b. 08/2021 Cor CTA: Ca2+ = 55.6 (77th%'ile), LAD 25p, LCX <25p. No signif non-cardiac findings.   Occlusion, cerebral artery    NOS w/infarction   Osteoporosis    Overweight (BMI 25.0-29.9) 07/17/2022   Personal history of radiation therapy 2006   BREAST CA   Pulmonary embolism (HCC)    a.04/2021 CTA Chest: Small filling defects are noted in upper and lower lobe branches of L PA-->acute PE-->eliquis . *PE occurred in context of COVID infxn.   Stroke [facility])    Ulcer    Past Surgical History:  Procedure Laterality Date   BREAST BIOPSY Left 2006   POS   BREAST LUMPECTOMY Left 09/20/2004   positive   BREAST SURGERY Left    malignant biopsy   CARDIAC CATHETERIZATION     COLONOSCOPY  2015   COLONOSCOPY N/A 01/09/2024   Procedure: COLONOSCOPY;  Surgeon: Unk Corinn Skiff, MD;  Location: [facility];  Service: Endoscopy;  Laterality: N/A;   COLONOSCOPY WITH PROPOFOL  N/A 06/08/2017   Procedure: COLONOSCOPY WITH PROPOFOL ;  Surgeon: Unk Corinn Skiff, MD;  Location: [facility];  Service: Gastroenterology;  Laterality: N/A;   COLONOSCOPY WITH PROPOFOL  N/A 03/08/2019   Procedure: COLONOSCOPY WITH PROPOFOL ;  Surgeon: Unk Corinn Skiff, MD;  Location: [facility] ENDOSCOPY;  Service:  Gastroenterology;  Laterality: N/A;   COLONOSCOPY WITH PROPOFOL  N/A 07/16/2020   Procedure: COLONOSCOPY WITH PROPOFOL ;  Surgeon: Unk Corinn Skiff, MD;  Location: [facility] ENDOSCOPY;  Service: Gastroenterology;  Laterality: N/A;   COLONOSCOPY WITH PROPOFOL  N/A 09/30/2022   Procedure: COLONOSCOPY WITH PROPOFOL ;  Surgeon: Unk Corinn Skiff, MD;  Location: [facility] ENDOSCOPY;  Service: Gastroenterology;  Laterality: N/A;   ESOPHAGOGASTRODUODENOSCOPY  2015   ESOPHAGOGASTRODUODENOSCOPY (EGD) WITH PROPOFOL  N/A 06/08/2017   Procedure: ESOPHAGOGASTRODUODENOSCOPY (EGD) WITH PROPOFOL ;  Surgeon: Unk Corinn Skiff, MD;  Location: [facility] SURGERY [facility]TR;  Service: Gastroenterology;  Laterality: N/A;   ESOPHAGOGASTRODUODENOSCOPY (EGD) WITH PROPOFOL  N/A 03/08/2019   Procedure: ESOPHAGOGASTRODUODENOSCOPY (EGD) WITH PROPOFOL ;  Surgeon: Unk Corinn Skiff, MD;  Location: [facility] ENDOSCOPY;  Service: Gastroenterology;  Laterality: N/A;   EYE SURGERY     FINGER SURGERY     Right small finger   FRACTURE SURGERY     GIVENS CAPSULE STUDY  2015   TUBAL LIGATION     Family History  Problem Relation Age of Onset   Heart disease Mother    Heart attack Mother    Hypertension Mother    Varicose Veins Mother    Cerebrovascular Accident Father    Arthritis Father    Hypertension Father    Dementia Father    Breast cancer Sister    Cancer Sister    Breast cancer Sister 45   Cancer Sister    Cancer Sister    Hypertension Other    Diabetes Other    Heart attack Other 40   Mental illness Neg Hx    Social History   Socioeconomic History   Marital status: Married    Spouse name: Education administrator   Number of children: 2   Years of education: Not on file   Highest education level: Associate degree: academic program  Occupational History   Not on file  Tobacco Use   Smoking status: Never   Smokeless tobacco: Never  Vaping Use   Vaping status: Never Used  Substance and Sexual Activity   Alcohol use: Never   Drug use:  Never   Sexual activity: Not Currently    Partners: Male    Birth control/protection: Surgical  Other Topics Concern   Not on file  Social History Narrative   Married. One son and one daughter. She is disabled after she had breast cancer and strokes. 2 caffeinated beverages daily.   Social Drivers of Corporate investment banker Strain: Low Risk  (07/05/2024)   Overall Financial Resource Strain (CARDIA)    Difficulty of Paying Living Expenses: Not hard at all  Food Insecurity: No Food Insecurity (07/05/2024)   Hunger Vital Sign    Worried About Running Out of Food in the Last Year: Never true    Ran Out of Food in the Last Year: Never true  Transportation Needs: No Transportation Needs (07/05/2024)   PRAPARE - Administrator, Civil Service (Medical): No    Lack of Transportation (Non-Medical): No  Physical Activity: Insufficiently Active (07/05/2024)   Exercise Vital Sign    Days of Exercise per Week: 2 days    Minutes of Exercise per Session: 30 min  Stress: No Stress Concern Present (07/05/2024)   [employer] of Occupational Health - Occupational Stress Questionnaire    Feeling of Stress: Not at all  Social Connections: Moderately Integrated (07/05/2024)   Social Connection and Isolation Panel    Frequency of Communication with Friends and Family: Once a week    Frequency of Social Gatherings with Friends and Family: Once a week    Attends Religious Services: More than 4 times per year    Active Member of [employer] or Organizations: Yes    Attends Engineer, structural: More than 4 times per year    Marital Status: Married    Tobacco Counseling Counseling given: Not Answered    Clinical Intake:  Pre-visit preparation completed: Yes  Pain : No/denies pain     BMI - recorded: 28.7 Nutritional Status: BMI 25 -29 Overweight Nutritional Risks: None Diabetes: No  No results found for: HGBA1C   How often do you need to have someone help you  when you read instructions, pamphlets, or other written materials from your doctor or pharmacy?: 1 - Never  Interpreter Needed?: No  Information entered by :: Carolyn DAS, Carolyn Campbell   Activities of Daily Living     07/05/2024    1:30 PM  07/03/2024   10:42 PM  In your present state of health, do you have any difficulty performing the following activities:  Hearing? 0   Vision? 1 1  Difficulty concentrating or making decisions? 0 0  Walking or climbing stairs? 0 0  Dressing or bathing? 0 0  Doing errands, shopping? 0 0  Preparing Food and eating ? N N  Using the Toilet? N N  In the past six months, have you accidently leaked urine? Y Y  Do you have problems with loss of bowel control? Y Y  Managing your Medications? N N  Managing your Finances? N N  Housekeeping or managing your Housekeeping? N N    Patient Care Team: Leavy Mole, PA-C as PCP - General (Family Medicine) Perla Evalene PARAS, MD as PCP - Cardiology (Cardiology) Unk Corinn Skiff, MD as Consulting Physician (Gastroenterology) Laurice Francis NOVAK, OD as Consulting Physician (Optometry) Herminio Miu, MD as Consulting Physician (Otolaryngology) Babara Call, MD as Consulting Physician (Oncology)  I have updated your Care Teams any recent Medical Services you may have received from other providers in the past year.     Assessment:   This is a routine wellness examination for Carolyn Campbell.  Hearing/Vision screen Hearing Screening - Comments:: NO AIDS Vision Screening - Comments:: READERS- DR.NICE- SEEN 4X PER YEAR   Goals Addressed             This Visit's Progress    DIET - EAT MORE FRUITS AND VEGETABLES         Depression Screen     07/05/2024    1:27 PM 03/27/2024    1:21 PM 09/28/2023    3:07 PM 05/09/2023   11:24 AM 03/28/2023    2:26 PM 02/04/2023    1:03 PM 12/10/2022   11:10 AM  PHQ 2/9 Scores  PHQ - 2 Score 0 0 0 0 0 0 0  PHQ- 9 Score 0 1 0 0 0  0    Fall Risk     07/03/2024   10:42 PM 03/27/2024     1:21 PM 09/28/2023    3:04 PM 05/09/2023   11:24 AM 03/28/2023    2:25 PM  Fall Risk   Falls in the past year? 0 0 0 0 0  Number falls in past yr: 0 0 0 0 0  Injury with Fall? 0 0 0 0 0  Risk for fall due to : No Fall Risks History of fall(s)  No Fall Risks No Fall Risks  Follow up Falls evaluation completed;Falls prevention discussed Falls prevention discussed Falls evaluation completed Falls prevention discussed;Education provided;Falls evaluation completed Falls prevention discussed;Education provided;Falls evaluation completed    MEDICARE RISK AT HOME:  Medicare Risk at Home Any stairs in or around the home?: Yes If so, are there any without handrails?: No Home free of loose throw rugs in walkways, pet beds, electrical cords, etc?: No Adequate lighting in your home to reduce risk of falls?: Yes Life alert?: No Use of a cane, walker or w/c?: No Grab bars in the bathroom?: No Shower chair or bench in shower?: No Elevated toilet seat or a handicapped toilet?: No  TIMED UP AND GO:  Was the test performed?  No  Cognitive Function: 6CIT completed        07/05/2024    1:31 PM 02/04/2023    1:09 PM 08/02/2019   12:00 PM 11/02/2018    9:47 AM 03/28/2018   10:36 AM  6CIT Screen  What Year? 0 points 0  points 0 points 0 points 0 points  What month? 0 points 0 points 0 points 0 points 0 points  What time? 0 points 0 points 0 points 0 points 0 points  Count back from 20 0 points 0 points 0 points 0 points 0 points  Months in reverse 0 points 0 points 0 points 0 points 0 points  Repeat phrase 0 points 0 points 0 points 2 points 0 points  Total Score 0 points 0 points 0 points 2 points 0 points    Immunizations Immunization History  Administered Date(s) Administered   Fluad Quad(high Dose 65+) 06/25/2019, 05/29/2020, 08/04/2021, 07/08/2022   Fluad Trivalent(High Dose 65+) 07/15/2023   Hepatitis A, Adult 06/22/2017   Hepatitis B, ADULT 06/22/2017   Hepb-cpg 04/11/2019   Influenza  Whole 07/05/2012   Influenza, Seasonal, Injecte, Preservative Fre 06/08/2011   Influenza,inj,Quad PF,6+ Mos 07/31/2014, 07/07/2015, 06/15/2016, 06/22/2017, 06/23/2018   Influenza-Unspecified 07/21/2013, 07/31/2014, 07/07/2015, 06/15/2016, 06/22/2017, 06/23/2018   PFIZER Comirnaty(Gray Top)Covid-19 Tri-Sucrose Vaccine 02/11/2021   PFIZER(Purple Top)SARS-COV-2 Vaccination 10/27/2019, 11/03/2019, 11/24/2019, 06/10/2020, 07/24/2020   Pfizer Covid-19 Vaccine Bivalent Booster 82yrs & up 07/08/2022   Pneumococcal Conjugate-13 06/22/2017   Pneumococcal Polysaccharide-23 03/27/2019   Tdap 03/28/2012   Zoster Recombinant(Shingrix ) 04/11/2019, 01/29/2021    Screening Tests Health Maintenance  Topic Date Due   Influenza Vaccine  04/20/2024   COVID-19 Vaccine (8 - 2025-26 season) 05/21/2024   Mammogram  07/26/2024   DEXA SCAN  01/30/2025   Medicare Annual Wellness (AWV)  07/05/2025   Colonoscopy  01/08/2029   Pneumococcal Vaccine: 50+ Years  Completed   Hepatitis C Screening  Completed   Zoster Vaccines- Shingrix   Completed   Meningococcal B Vaccine  Aged Out   DTaP/Tdap/Td  Discontinued   Hepatitis B Vaccines 19-59 Average Risk  Discontinued    Health Maintenance Items Addressed: NEEDS FLU SHOT; REFERRAL SENT FOR MAMMOGRAM; UP TO DATE ON COLONOSCOPY & BDS  Additional Screening:  Vision Screening: Recommended annual ophthalmology exams for early detection of glaucoma and other disorders of the eye. Is the patient up to date with their annual eye exam?  Yes  Who is the provider or what is the name of the office in which the patient attends annual eye exams? DR. NICE  Dental Screening: Recommended annual dental exams for proper oral hygiene  Community Resource Referral / Chronic Care Management: CRR required this visit?  No   CCM required this visit?  No   Plan:    I have personally reviewed and noted the following in the patient's chart:   Medical and social history Use of  alcohol, tobacco or illicit drugs  Current medications and supplements including opioid prescriptions. Patient is not currently taking opioid prescriptions. Functional ability and status Nutritional status Physical activity Advanced directives List of other physicians Hospitalizations, surgeries, and ER visits in previous 12 months Vitals Screenings to include cognitive, depression, and falls Referrals and appointments  In addition, I have reviewed and discussed with patient certain preventive protocols, quality metrics, and best practice recommendations. A written personalized care plan for preventive services as well as general preventive health recommendations were provided to patient.   Carolyn GORMAN Das, Carolyn Campbell   [id number]   After Visit Summary: (MyChart) Due to this being a telephonic visit, the after visit summary with patients personalized plan was offered to patient via MyChart   Notes: REFERRAL SENT FOR MAMMOGRAM

## 2024-07-05 NOTE — Patient Instructions (Addendum)
 [name],  Thank you for taking the time for your Medicare Wellness Visit. I appreciate your continued commitment to your health goals. Please review the care plan we discussed, and feel free to reach out if I can assist you further.  Medicare recommends these wellness visits once per year to help you and your care team stay ahead of potential health issues. These visits are designed to focus on prevention, allowing your provider to concentrate on managing your acute and chronic conditions during your regular appointments.  Please note that Annual Wellness Visits do not include a physical exam. Some assessments may be limited, especially if the visit was conducted virtually. If needed, we may recommend a separate in-person follow-up with your provider.  Ongoing Care Seeing your primary care provider every 3 to 6 months helps us  monitor your health and provide consistent, personalized care.   Referrals If a referral was made during today's visit and you haven't received any updates within two weeks, please contact the referred provider directly to check on the status.  Recommended Screenings:  Health Maintenance  Topic Date Due   Flu Shot  04/20/2024   COVID-19 Vaccine (8 - 2025-26 season) 05/21/2024   Breast Cancer Screening  07/26/2024   DEXA scan (bone density measurement)  01/30/2025   Medicare Annual Wellness Visit  07/05/2025   Colon Cancer Screening  01/08/2029   Pneumococcal Vaccine for age over 62  Completed   Hepatitis C Screening  Completed   Zoster (Shingles) Vaccine  Completed   Meningitis B Vaccine  Aged Out   DTaP/Tdap/Td vaccine  Discontinued   Hepatitis B Vaccine  Discontinued    Advance Care Planning is important because it: Ensures you receive medical care that aligns with your values, goals, and preferences. Provides guidance to your family and loved ones, reducing the emotional burden of decision-making during critical moments.  Vision: Annual vision  screenings are recommended for early detection of glaucoma, cataracts, and diabetic retinopathy. These exams can also reveal signs of chronic conditions such as diabetes and high blood pressure.  Dental: Annual dental screenings help detect early signs of oral cancer, gum disease, and other conditions linked to overall health, including heart disease and diabetes.  Please see the attached documents for additional preventive care recommendations.   NEXT AWV 07/11/25 @ 12:40 PM BY PHONE

## 2024-07-06 ENCOUNTER — Other Ambulatory Visit: Payer: Self-pay | Admitting: Medical Genetics

## 2024-07-06 DIAGNOSIS — Z006 Encounter for examination for normal comparison and control in clinical research program: Secondary | ICD-10-CM

## 2024-07-10 ENCOUNTER — Ambulatory Visit
Admission: RE | Admit: 2024-07-10 | Discharge: 2024-07-10 | Disposition: A | Discharge status: Home / Self Care | Source: Ambulatory Visit | Attending: Gastroenterology | Admitting: Gastroenterology

## 2024-07-10 VITALS — BP 129/51 | HR 59 | Temp 97.2°F | Resp 16 | Wt 147.0 lb

## 2024-07-10 DIAGNOSIS — K5010 Crohn's disease of large intestine without complications: Secondary | ICD-10-CM | POA: Diagnosis not present

## 2024-07-10 MED ORDER — VEDOLIZUMAB 300 MG IV SOLR
300.0000 mg | Freq: Once | INTRAVENOUS | Status: AC
  Administered 2024-07-10: 300 mg via INTRAVENOUS
  Filled 2024-07-10: qty 5

## 2024-07-10 MED ORDER — SODIUM CHLORIDE 0.9 % IV SOLN: INTRAVENOUS | Status: DC

## 2024-07-16 ENCOUNTER — Encounter: Payer: Self-pay | Admitting: Family Medicine

## 2024-07-24 ENCOUNTER — Encounter: Payer: Self-pay | Admitting: Family Medicine

## 2024-07-25 ENCOUNTER — Ambulatory Visit: Attending: Neurology

## 2024-07-25 DIAGNOSIS — R41841 Cognitive communication deficit: Secondary | ICD-10-CM | POA: Diagnosis present

## 2024-07-25 MED ORDER — ROSUVASTATIN CALCIUM 40 MG PO TABS: 40.0000 mg | ORAL_TABLET | Freq: Every day | ORAL | 0 refills | Status: DC

## 2024-07-25 NOTE — Therapy (Signed)
 OUTPATIENT SPEECH LANGUAGE PATHOLOGY  EVALUATION   Patient Name: Carolyn Campbell MRN: [id number] DOB:[birth year], 68 y.o., female Today's Date: 07/25/2024  PCP: Michelene Cower, PA-C REFERRING PROVIDER: Jannett Fairly, MD   End of Session - 07/25/24 1520     Visit Number 1    Number of Visits 24    Date for Recertification  10/17/24    SLP Start Time 1400    SLP Stop Time  1450    SLP Time Calculation (min) 50 min    Activity Tolerance Patient tolerated treatment well           Patient Active Problem List   Diagnosis Date Noted   Anemia 03/27/2024   REM behavioral disorder 03/14/2023   Excessive daytime sleepiness 03/14/2023   Crohn's disease of large intestine without complication (HCC) 09/30/2022   Encounter for current long-term use of anticoagulants 09/24/2022   MDD (major depressive disorder), recurrent episode, mild 08/18/2022   Restless leg syndrome 08/18/2022   Pulmonary embolism (HCC) 07/16/2022   Pain in limb 11/10/2021   CAD (coronary artery disease) 08/06/2021   Glaucoma 02/23/2021   Other long term (current) drug therapy 01/22/2021   Varicose veins of both lower extremities with pain 07/19/2018   Arthritis of right hand 12/21/2017   Major depression in remission 12/21/2017   Fibrocystic breast changes 06/22/2017   Osteopenia 10/18/2016   Atherosclerosis of aorta 12/19/2015   Vitreous degeneration 05/21/2015   Glaucoma associated with chamber angle anomalies 05/21/2015   H/O malignant neoplasm of breast 05/21/2015   Solitary pulmonary nodule 11/07/2012   Cognitive deficits as late effect of cerebrovascular disease 01/20/2010   CVA, old, hemiparesis (HCC) 04/28/2009   Acquired hypothyroidism 06/12/2008   HLD (hyperlipidemia) 01/18/2007   Primary hypertension 01/18/2007    ONSET DATE: referral date 04/19/24  REFERRING DIAG: cognitive impairment  THERAPY DIAG:  Cognitive communication deficit  Rationale for Evaluation and Treatment  Rehabilitation  SUBJECTIVE:   SUBJECTIVE STATEMENT: Pt alert, pleasant, and cooperative.  Pt accompanied by: self  PERTINENT HISTORY & DIAGNOSTIC FINDINGS: Pt is a 68 yo female who presents for cognitive-communication evaluation in setting of multifactorial cognitive impairment. Pt with PMHx including, but not limited to: stroke, depression, REM sleep behavior disorder, Crohn's disease, HLD, HTN, thyroid  disease. Pt with reports of gradual memory decline including difficulty remembering people's names, what she's doing, and losing train of thought. Pt with familial hx of dementia (father). MRI brain, 07/19/22, 1. No acute intracranial abnormality. 2. Mild chronic microvascular ischemic change.  PAIN:  Are you having pain? No   FALLS: Has patient fallen in last 6 months?  No  LIVING ENVIRONMENT: Lives with: lives with their spouse and lives with their son Lives in: House/apartment  PLOF:  Level of assistance: Independent with ADLs Employment: Retired   PATIENT GOALS   for memory to improve  OBJECTIVE:   COGNITIVE COMMUNICATION: Overall cognitive status: Impaired Areas of impairment:  Attention: Impaired: Comment: suspect higher level attention deficits per pt report Memory: Impaired: Immediate Short term Visuospatial ability: impaired  AUDITORY COMPREHENSION: Overall auditory comprehension: Appears intact    READING COMPREHENSION: DNT  EXPRESSION: verbal  VERBAL EXPRESSION: Level of generative/spontaneous verbalization: word and phrase Automatic speech: name: intact and social response: intact  Repetition: Appears intact Naming: Confrontation: self-correction Pragmatics: Appears intact Comments: Endorses anomia during conversations; not appreciated this date  WRITTEN EXPRESSION: Dominant hand: right   Written expression: Appears intact  MOTOR SPEECH: Overall motor speech: impaired Level of impairment: all levels Respiration: adequate  Phonation: mildly  hoarse; functional Resonance: WFL Articulation: Impaired: all levels; very minimal and baseline per pt Intelligibility: Intelligible  ORAL MOTOR EXAMINATION: WFL  STANDARDIZED ASSESSMENTS: Addenbrooke's Cognitive Examination - ACE III The Addenbrooke's Cognitive Examination-III (ACE-III) is a brief cognitive test that assesses five cognitive domains. The total score is 100 with higher scores indicating better cognitive functioning. Cut off scores of 88 and 82 are recommended for suspicion of dementia (88 has sensitivity of 1.00 and specificity of 0.96, 82 has sensitivity of 0.93 and specificity of 1.00). American Version A  Attention 18/18  Memory 14/26  Fluency 12/14  Language 24/26  Visuospatial 13/16  TOTAL ACE- III Score 81/100     PATIENT REPORTED OUTCOME MEASURES (PROM): To be completed in upcoming sessions, as appropriate   TODAY'S TREATMENT:  Pt educated re: role of SLP, results of assessment, domains of cognition, CLOF, and SLP POC. Of note, pt tearful re: CLOF. Supportive counseling provided.    PATIENT EDUCATION: Education details: as above Person educated: Patient Education method: Explanation Education comprehension: verbalized understanding and needs further education  HOME EXERCISE PROGRAM:        To be provided in upcoming sessions    GOALS:  Goals reviewed with patient? Yes  SHORT TERM GOALS: Target date: 10 sessions  Pt will complete PROM for memory.  Baseline: Goal status: INITIAL   2.  Pt and/or caregiver will endorse successful implementation of at least x3 strategies to improve attention/memory.  Baseline:  Goal status: INITIAL  3.  With minimal assistance, patient will establish external aid for memory/executive function and bring to more than 50% of therapy sessions.  Baseline:  Goal status: INITIAL  4.  With moderate assistance, pt will utilize preferred compensation for anomia during structured tasks.  Baseline:  Goal status:  INITIAL   LONG TERM GOALS: Target date: 12 weeks  Pt will report improved feelings and/or use of compensations for memory per PROM.  Baseline:  Goal status: INITIAL  2.  Pt will demonstrate understanding of  ways to promote and support cognitive-communication outside of SLP sessions.  Baseline:  Goal status: INITIAL    ASSESSMENT:  CLINICAL IMPRESSION:  Pt is a 68 yo female who presents for cognitive-communication evaluation in setting of multifactorial cognitive impairment. Pt with PMHx including, but not limited to: stroke, depression, REM sleep behavior disorder, Crohn's disease, HLD, HTN, thyroid  disease. Pt with reports of gradual memory decline including difficulty remembering people's names, what she is doing, and losing train of thought. Pt with familial hx of dementia (father). MRI brain, 07/19/22, 1. No acute intracranial abnormality. 2. Mild chronic microvascular ischemic change.   Assessment today completed via informal means as well as standardized assessment (ACE-III). Pt presents with mild cognitive-communication deficits affecting memory, language, and visuospatial skills. Suspect higher level attention deficits (e.g. divided attention) based on pt's report. Pt also reports anomia which was not apparent on evaluation and may also be related to higher level attention deficits. Pt endorsed CLOF is impacting her QoL both at home and in the community. Will plan to complete PROM in upcoming sessions to further assess the impact her cognitive-communication deficits are having in her daily life.   Recommend course of ST targeting above mentioned deficits with emphasis on education and compensation.  OBJECTIVE IMPAIRMENTS include attention, memory, and expressive language. These impairments are limiting patient from ADLs/IADLs and effectively communicating at home and in community. Factors affecting potential to achieve goals and functional outcome are co-morbidities. Patient will  benefit from  skilled SLP services to address above impairments and improve overall function.  REHAB POTENTIAL: Good  PLAN: SLP FREQUENCY: 2x/week  SLP DURATION: 12 weeks  PLANNED INTERVENTIONS: Cognitive reorganization, Internal/external aids, Functional tasks, Multimodal communication approach, SLP instruction and feedback, Compensatory strategies, and Patient/family education    Delon Bangs, M.S., CCC-SLP Speech-Language Pathologist [place] [facility] [phone] [place])  [facility] Outpatient Rehabilitation at [facility] [address] [place], [place], [address] Phone: [phone]   Fax:  [phone]

## 2024-07-30 ENCOUNTER — Ambulatory Visit

## 2024-07-30 DIAGNOSIS — R41841 Cognitive communication deficit: Secondary | ICD-10-CM

## 2024-07-30 NOTE — Therapy (Addendum)
 " OUTPATIENT SPEECH LANGUAGE PATHOLOGY  TREATMENT   Patient Name: Carolyn Campbell MRN: [id number] DOB:[birth year], 68 y.o., female Today's Date: 07/30/2024  PCP: Michelene Cower, PA-C REFERRING PROVIDER: Jannett Fairly, MD   End of Session - 07/30/24 1453     Number of Visits 24    Date for Recertification  10/17/24    Authorization Type [facility] Medicare 2025    Authorization Time Period [facility] auth#: [id number] for 16 SLP vst from 11/05-12/31    Authorization - Visit Number 2    Authorization - Number of Visits 16    Progress Note Due on Visit 10    SLP Start Time 1405    SLP Stop Time  1450    SLP Time Calculation (min) 45 min    Activity Tolerance Patient tolerated treatment well           Patient Active Problem List   Diagnosis Date Noted   Anemia 03/27/2024   REM behavioral disorder 03/14/2023   Excessive daytime sleepiness 03/14/2023   Crohn's disease of large intestine without complication (HCC) 09/30/2022   Encounter for current long-term use of anticoagulants 09/24/2022   MDD (major depressive disorder), recurrent episode, mild 08/18/2022   Restless leg syndrome 08/18/2022   Pulmonary embolism (HCC) 07/16/2022   Pain in limb 11/10/2021   CAD (coronary artery disease) 08/06/2021   Glaucoma 02/23/2021   Other long term (current) drug therapy 01/22/2021   Varicose veins of both lower extremities with pain 07/19/2018   Arthritis of right hand 12/21/2017   Major depression in remission 12/21/2017   Fibrocystic breast changes 06/22/2017   Osteopenia 10/18/2016   Atherosclerosis of aorta 12/19/2015   Vitreous degeneration 05/21/2015   Glaucoma associated with chamber angle anomalies 05/21/2015   H/O malignant neoplasm of breast 05/21/2015   Solitary pulmonary nodule 11/07/2012   Cognitive deficits as late effect of cerebrovascular disease 01/20/2010   CVA, old, hemiparesis (HCC) 04/28/2009   Acquired hypothyroidism 06/12/2008   HLD (hyperlipidemia) 01/18/2007   Primary  hypertension 01/18/2007    ONSET DATE: referral date 04/19/24  REFERRING DIAG: cognitive impairment  THERAPY DIAG:  Cognitive communication deficit  Rationale for Evaluation and Treatment Rehabilitation  SUBJECTIVE:   SUBJECTIVE STATEMENT: Pt alert, pleasant, and cooperative.  Pt accompanied by: self  PERTINENT HISTORY & DIAGNOSTIC FINDINGS: Pt is a 68 yo female who presents for cognitive-communication evaluation in setting of multifactorial cognitive impairment. Pt with PMHx including, but not limited to: stroke, depression, REM sleep behavior disorder, Crohn's disease, HLD, HTN, thyroid  disease. Pt with reports of gradual memory decline including difficulty remembering people's names, what she's doing, and losing train of thought. Pt with familial hx of dementia (father). MRI brain, 07/19/22, 1. No acute intracranial abnormality. 2. Mild chronic microvascular ischemic change.  PAIN:  Are you having pain? No   FALLS: Has patient fallen in last 6 months?  No  LIVING ENVIRONMENT: Lives with: lives with their spouse and lives with their son Lives in: House/apartment  PLOF:  Level of assistance: Independent with ADLs Employment: Retired   PATIENT GOALS   for memory to improve  OBJECTIVE:    PATIENT REPORTED OUTCOME MEASURES (PROM):  MULTIFACTORIAL MEMORY QUESTIONNAIRE (MMQ)  Administered patient self-reported outcome measure Multifactorial Memory Questionnaire (MMQ). The Multifactorial Memory Questionnaire [facility]) consists of three scales measuring separate aspects of metamemory; Satisfaction, Ability and Strategy.    Very Low - < 20 Low - 20 to 29 Below Average - 30-39 Average - 40 to 60 Above Average - 60  to 70 High - 71 to 80 Very High - > 80  Pt reports:  Very Low - < 20 Memory Satisfaction (T-score: 25) Below Average - 30-39 Memory Ability (T-score: 37) Low - 20 to 29 use of Memory Strategies (T-score: 36)     TODAY'S TREATMENT:  Reviewed results of  assessment, domains of cognition, and SLP POC. Pt participated in meaningful dialogue re: CLOF and goals.    PATIENT EDUCATION: Education details: as above Person educated: Patient Education method: Explanation Education comprehension: verbalized understanding and needs further education  HOME EXERCISE PROGRAM:        To be provided in upcoming sessions    GOALS:  Goals reviewed with patient? Yes  SHORT TERM GOALS: Target date: 10 sessions  Pt will complete PROM for memory.  Baseline: Goal status: MET   2.  Pt and/or caregiver will endorse successful implementation of at least x3 strategies to improve attention/memory.  Baseline:  Goal status: INITIAL  3.  With minimal assistance, patient will establish external aid for memory/executive function and bring to more than 50% of therapy sessions.  Baseline:  Goal status: INITIAL  4.  With moderate assistance, pt will utilize preferred compensation for anomia during structured tasks.  Baseline:  Goal status: INITIAL   LONG TERM GOALS: Target date: 12 weeks  Pt will report improved feelings and/or use of compensations for memory per PROM.  Baseline:  Goal status: INITIAL  2.  Pt will demonstrate understanding of  ways to promote and support cognitive-communication outside of SLP sessions.  Baseline:  Goal status: INITIAL    ASSESSMENT:  CLINICAL IMPRESSION:  Pt is a 68 yo female who presents for cognitive-communication tx in setting of multifactorial cognitive impairment. Pt with PMHx including, but not limited to: stroke, depression, REM sleep behavior disorder, Crohn's disease, HLD, HTN, thyroid  disease. Pt with reports of gradual memory decline including difficulty remembering people's names, what she is doing, and losing train of thought. Pt with familial hx of dementia (father). MRI brain, 07/19/22, 1. No acute intracranial abnormality. 2. Mild chronic microvascular ischemic change.   Initial completed via  informal means as well as standardized assessment (ACE-III). Pt presents with mild cognitive-communication deficits affecting memory, language, and visuospatial skills. Suspect higher level attention deficits (e.g. divided attention) based on pt's report. Pt also reports anomia which was not apparent on evaluation and may also be related to higher level attention deficits. Pt endorsed CLOF is impacting her QoL both at home and in the community.    See details of today's tx above.    Recommend course of ST targeting above mentioned deficits with emphasis on education and compensation.   OBJECTIVE IMPAIRMENTS include attention, memory, and expressive language. These impairments are limiting patient from ADLs/IADLs and effectively communicating at home and in community. Factors affecting potential to achieve goals and functional outcome are co-morbidities. Patient will benefit from skilled SLP services to address above impairments and improve overall function.  REHAB POTENTIAL: Good  PLAN: SLP FREQUENCY: 2x/week  SLP DURATION: 12 weeks  PLANNED INTERVENTIONS: Cognitive reorganization, Internal/external aids, Functional tasks, Multimodal communication approach, SLP instruction and feedback, Compensatory strategies, and Patient/family education    Delon Bangs, M.S., CCC-SLP Speech-Language Pathologist [place] [facility] [phone] [place])  [facility] Outpatient Rehabilitation at [facility] [address] [place], [place], [address] Phone: [phone]   Fax:  [phone]            "

## 2024-08-01 ENCOUNTER — Ambulatory Visit

## 2024-08-01 DIAGNOSIS — R41841 Cognitive communication deficit: Secondary | ICD-10-CM

## 2024-08-01 NOTE — Therapy (Signed)
 OUTPATIENT SPEECH LANGUAGE PATHOLOGY  TREATMENT   Patient Name: Carolyn Campbell MRN: [id number] DOB:[birth year], 68 y.o., female Today's Date: 08/01/2024  PCP: Michelene Cower, PA-C REFERRING PROVIDER: Jannett Fairly, MD   End of Session - 08/01/24 1402     Visit Number 3    Number of Visits 24    Date for Recertification  10/17/24    Authorization Type [facility] Medicare 2025    Authorization Time Period [facility] auth#: [id number] for 16 SLP vst from 11/05-12/31    Authorization - Visit Number 3    Authorization - Number of Visits 16    Progress Note Due on Visit 10    SLP Start Time 1402    SLP Stop Time  1445    SLP Time Calculation (min) 43 min           Patient Active Problem List   Diagnosis Date Noted   Anemia 03/27/2024   REM behavioral disorder 03/14/2023   Excessive daytime sleepiness 03/14/2023   Crohn's disease of large intestine without complication (HCC) 09/30/2022   Encounter for current long-term use of anticoagulants 09/24/2022   MDD (major depressive disorder), recurrent episode, mild 08/18/2022   Restless leg syndrome 08/18/2022   Pulmonary embolism (HCC) 07/16/2022   Pain in limb 11/10/2021   CAD (coronary artery disease) 08/06/2021   Glaucoma 02/23/2021   Other long term (current) drug therapy 01/22/2021   Varicose veins of both lower extremities with pain 07/19/2018   Arthritis of right hand 12/21/2017   Major depression in remission 12/21/2017   Fibrocystic breast changes 06/22/2017   Osteopenia 10/18/2016   Atherosclerosis of aorta 12/19/2015   Vitreous degeneration 05/21/2015   Glaucoma associated with chamber angle anomalies 05/21/2015   H/O malignant neoplasm of breast 05/21/2015   Solitary pulmonary nodule 11/07/2012   Cognitive deficits as late effect of cerebrovascular disease 01/20/2010   CVA, old, hemiparesis (HCC) 04/28/2009   Acquired hypothyroidism 06/12/2008   HLD (hyperlipidemia) 01/18/2007   Primary hypertension 01/18/2007    ONSET DATE:  referral date 04/19/24  REFERRING DIAG: cognitive impairment  THERAPY DIAG:  Cognitive communication deficit  Rationale for Evaluation and Treatment Rehabilitation  SUBJECTIVE:   SUBJECTIVE STATEMENT: Pt alert, pleasant, and cooperative.  Pt accompanied by: self  PERTINENT HISTORY & DIAGNOSTIC FINDINGS: Pt is a 68 yo female who presents for cognitive-communication evaluation in setting of multifactorial cognitive impairment. Pt with PMHx including, but not limited to: stroke, depression, REM sleep behavior disorder, Crohn's disease, HLD, HTN, thyroid  disease. Pt with reports of gradual memory decline including difficulty remembering people's names, what she's doing, and losing train of thought. Pt with familial hx of dementia (father). MRI brain, 07/19/22, 1. No acute intracranial abnormality. 2. Mild chronic microvascular ischemic change.  PAIN:  Are you having pain? No   FALLS: Has patient fallen in last 6 months?  No  LIVING ENVIRONMENT: Lives with: lives with their spouse and lives with their son Lives in: House/apartment  PLOF:  Level of assistance: Independent with ADLs Employment: Retired   PATIENT GOALS   for memory to improve  OBJECTIVE:   TODAY'S TREATMENT:  Introduced types of attention and memory compensations. Pt identified difficulty with alternating and divided attention. Discussed compensations with pt identifying doing 1 task at a time and telling self to mentally focus as things she can trial for HEP.   PATIENT EDUCATION: Education details: as above Person educated: Patient Education method: Explanation; Handout Education comprehension: verbalized understanding and needs further education  HOME EXERCISE PROGRAM:  Start implementing attention compensations    GOALS:  Goals reviewed with patient? Yes  SHORT TERM GOALS: Target date: 10 sessions  Pt will complete PROM for memory.  Baseline: Goal status: MET   2.  Pt and/or caregiver  will endorse successful implementation of at least x3 strategies to improve attention/memory.  Baseline:  Goal status: INITIAL  3.  With minimal assistance, patient will establish external aid for memory/executive function and bring to more than 50% of therapy sessions.  Baseline:  Goal status: INITIAL  4.  With moderate assistance, pt will utilize preferred compensation for anomia during structured tasks.  Baseline:  Goal status: INITIAL   LONG TERM GOALS: Target date: 12 weeks  Pt will report improved feelings and/or use of compensations for memory per PROM.  Baseline:  Goal status: INITIAL  2.  Pt will demonstrate understanding of  ways to promote and support cognitive-communication outside of SLP sessions.  Baseline:  Goal status: INITIAL    ASSESSMENT:  CLINICAL IMPRESSION:  Pt is a 68 yo female who presents for cognitive-communication tx in setting of multifactorial cognitive impairment. Pt with PMHx including, but not limited to: stroke, depression, REM sleep behavior disorder, Crohn's disease, HLD, HTN, thyroid  disease. Pt with reports of gradual memory decline including difficulty remembering people's names, what she is doing, and losing train of thought. Pt with familial hx of dementia (father). MRI brain, 07/19/22, 1. No acute intracranial abnormality. 2. Mild chronic microvascular ischemic change.   Initial completed via informal means as well as standardized assessment (ACE-III). Pt presents with mild cognitive-communication deficits affecting memory, language, and visuospatial skills. Suspect higher level attention deficits (e.g. divided attention) based on pt's report. Pt also reports anomia which was not apparent on evaluation and may also be related to higher level attention deficits. Pt endorsed CLOF is impacting her QoL both at home and in the community.    See details of today's tx above.    Recommend course of ST targeting above mentioned deficits with  emphasis on education and compensation.   OBJECTIVE IMPAIRMENTS include attention, memory, and expressive language. These impairments are limiting patient from ADLs/IADLs and effectively communicating at home and in community. Factors affecting potential to achieve goals and functional outcome are co-morbidities. Patient will benefit from skilled SLP services to address above impairments and improve overall function.  REHAB POTENTIAL: Good  PLAN: SLP FREQUENCY: 2x/week  SLP DURATION: 12 weeks  PLANNED INTERVENTIONS: Cognitive reorganization, Internal/external aids, Functional tasks, Multimodal communication approach, SLP instruction and feedback, Compensatory strategies, and Patient/family education    Delon Bangs, M.S., CCC-SLP Speech-Language Pathologist [place] [facility] [phone] [place])  [facility] [facility] Outpatient Rehabilitation at [facility] [address] [place], [place], [address] Phone: [phone]   Fax:  [phone]

## 2024-08-06 ENCOUNTER — Ambulatory Visit

## 2024-08-06 DIAGNOSIS — R41841 Cognitive communication deficit: Secondary | ICD-10-CM | POA: Diagnosis not present

## 2024-08-06 NOTE — Therapy (Signed)
 OUTPATIENT SPEECH LANGUAGE PATHOLOGY  TREATMENT   Patient Name: Carolyn Campbell MRN: [id number] DOB:[birth year], 68 y.o., female Today's Date: 08/06/2024  PCP: Michelene Cower, PA-C REFERRING PROVIDER: Jannett Fairly, MD   End of Session - 08/06/24 1400     Visit Number 4    Number of Visits 24    Date for Recertification  10/17/24    Authorization Type [facility] Medicare 2025    Authorization Time Period [facility] auth#: [id number] for 16 SLP vst from 11/05-12/31    Authorization - Visit Number 4    Authorization - Number of Visits 16    Progress Note Due on Visit 10    SLP Start Time 1400    SLP Stop Time  1445    SLP Time Calculation (min) 45 min    Activity Tolerance Patient tolerated treatment well           Patient Active Problem List   Diagnosis Date Noted   Anemia 03/27/2024   REM behavioral disorder 03/14/2023   Excessive daytime sleepiness 03/14/2023   Crohn's disease of large intestine without complication (HCC) 09/30/2022   Encounter for current long-term use of anticoagulants 09/24/2022   MDD (major depressive disorder), recurrent episode, mild 08/18/2022   Restless leg syndrome 08/18/2022   Pulmonary embolism (HCC) 07/16/2022   Pain in limb 11/10/2021   CAD (coronary artery disease) 08/06/2021   Glaucoma 02/23/2021   Other long term (current) drug therapy 01/22/2021   Varicose veins of both lower extremities with pain 07/19/2018   Arthritis of right hand 12/21/2017   Major depression in remission 12/21/2017   Fibrocystic breast changes 06/22/2017   Osteopenia 10/18/2016   Atherosclerosis of aorta 12/19/2015   Vitreous degeneration 05/21/2015   Glaucoma associated with chamber angle anomalies 05/21/2015   H/O malignant neoplasm of breast 05/21/2015   Solitary pulmonary nodule 11/07/2012   Cognitive deficits as late effect of cerebrovascular disease 01/20/2010   CVA, old, hemiparesis (HCC) 04/28/2009   Acquired hypothyroidism 06/12/2008   HLD (hyperlipidemia) 01/18/2007    Primary hypertension 01/18/2007    ONSET DATE: referral date 04/19/24  REFERRING DIAG: cognitive impairment  THERAPY DIAG:  Cognitive communication deficit  Rationale for Evaluation and Treatment Rehabilitation  SUBJECTIVE:   SUBJECTIVE STATEMENT: Pt alert, pleasant, and cooperative.  Pt accompanied by: self  PERTINENT HISTORY & DIAGNOSTIC FINDINGS: Pt is a 68 yo female who presents for cognitive-communication evaluation in setting of multifactorial cognitive impairment. Pt with PMHx including, but not limited to: stroke, depression, REM sleep behavior disorder, Crohn's disease, HLD, HTN, thyroid  disease. Pt with reports of gradual memory decline including difficulty remembering people's names, what she's doing, and losing train of thought. Pt with familial hx of dementia (father). MRI brain, 07/19/22, 1. No acute intracranial abnormality. 2. Mild chronic microvascular ischemic change.  PAIN:  Are you having pain? No   FALLS: Has patient fallen in last 6 months?  No  LIVING ENVIRONMENT: Lives with: lives with their spouse and lives with their son Lives in: House/apartment  PLOF:  Level of assistance: Independent with ADLs Employment: Retired   PATIENT GOALS   for memory to improve  OBJECTIVE:   TODAY'S TREATMENT:  Reveiwed types of attention and memory compensations. Pt reports completing 1 task at a time and talking out loud to keep herself on task. Introduced internal and external strategies. Pt planning to trial use of calendar (with important dates highlighted) and finding a permanent place for cell phone (a commonly misplaced item).  PATIENT EDUCATION: Education details:  as above Person educated: Patient Education method: Explanation; Handout Education comprehension: verbalized understanding and needs further education  HOME EXERCISE PROGRAM:        Start implementing attention compensations    GOALS:  Goals reviewed with patient? Yes  SHORT TERM  GOALS: Target date: 10 sessions  Pt will complete PROM for memory.  Baseline: Goal status: MET   2.  Pt and/or caregiver will endorse successful implementation of at least x3 strategies to improve attention/memory.  Baseline:  Goal status: INITIAL  3.  With minimal assistance, patient will establish external aid for memory/executive function and bring to more than 50% of therapy sessions.  Baseline:  Goal status: INITIAL  4.  With moderate assistance, pt will utilize preferred compensation for anomia during structured tasks.  Baseline:  Goal status: INITIAL   LONG TERM GOALS: Target date: 12 weeks  Pt will report improved feelings and/or use of compensations for memory per PROM.  Baseline:  Goal status: INITIAL  2.  Pt will demonstrate understanding of  ways to promote and support cognitive-communication outside of SLP sessions.  Baseline:  Goal status: INITIAL    ASSESSMENT:  CLINICAL IMPRESSION:  Pt is a 68 yo female who presents for cognitive-communication tx in setting of multifactorial cognitive impairment. Pt with PMHx including, but not limited to: stroke, depression, REM sleep behavior disorder, Crohn's disease, HLD, HTN, thyroid  disease. Pt with reports of gradual memory decline including difficulty remembering people's names, what she is doing, and losing train of thought. Pt with familial hx of dementia (father). MRI brain, 07/19/22, 1. No acute intracranial abnormality. 2. Mild chronic microvascular ischemic change.   Initial completed via informal means as well as standardized assessment (ACE-III). Pt presents with mild cognitive-communication deficits affecting memory, language, and visuospatial skills. Suspect higher level attention deficits (e.g. divided attention) based on pt's report. Pt also reports anomia which was not apparent on evaluation and may also be related to higher level attention deficits. Pt endorsed CLOF is impacting her QoL both at home and in  the community.    See details of today's tx above.    Recommend course of ST targeting above mentioned deficits with emphasis on education and compensation.   OBJECTIVE IMPAIRMENTS include attention, memory, and expressive language. These impairments are limiting patient from ADLs/IADLs and effectively communicating at home and in community. Factors affecting potential to achieve goals and functional outcome are co-morbidities. Patient will benefit from skilled SLP services to address above impairments and improve overall function.  REHAB POTENTIAL: Good  PLAN: SLP FREQUENCY: 2x/week  SLP DURATION: 12 weeks  PLANNED INTERVENTIONS: Cognitive reorganization, Internal/external aids, Functional tasks, Multimodal communication approach, SLP instruction and feedback, Compensatory strategies, and Patient/family education    Delon Bangs, M.S., CCC-SLP Speech-Language Pathologist [place] [facility] [phone] [place])  [place] [facility] Outpatient Rehabilitation at [facility] [address] [place], [place], [address] Phone: [phone]   Fax:  [phone]

## 2024-08-08 ENCOUNTER — Ambulatory Visit

## 2024-08-08 DIAGNOSIS — R41841 Cognitive communication deficit: Secondary | ICD-10-CM | POA: Diagnosis not present

## 2024-08-08 NOTE — Therapy (Signed)
 OUTPATIENT SPEECH LANGUAGE PATHOLOGY  TREATMENT   Patient Name: Carolyn Campbell MRN: [id number] DOB:1956/08/14, 68 y.o., female Today's Date: 08/08/2024  PCP: Michelene Cower, PA-C REFERRING PROVIDER: Jannett Fairly, MD   End of Session - 08/08/24 1610     Visit Number 5    Number of Visits 24    Date for Recertification  10/17/24    Authorization Type [facility] Medicare 2025    Authorization Time Period [facility] auth#: [id number] for 16 SLP vst from 11/05-12/31    Authorization - Visit Number 5    Authorization - Number of Visits 16    Progress Note Due on Visit 10    SLP Start Time 1530    SLP Stop Time  1600    SLP Time Calculation (min) 30 min    Activity Tolerance Patient tolerated treatment well           Patient Active Problem List   Diagnosis Date Noted   Anemia 03/27/2024   REM behavioral disorder 03/14/2023   Excessive daytime sleepiness 03/14/2023   Crohn's disease of large intestine without complication (HCC) 09/30/2022   Encounter for current long-term use of anticoagulants 09/24/2022   MDD (major depressive disorder), recurrent episode, mild 08/18/2022   Restless leg syndrome 08/18/2022   Pulmonary embolism (HCC) 07/16/2022   Pain in limb 11/10/2021   CAD (coronary artery disease) 08/06/2021   Glaucoma 02/23/2021   Other long term (current) drug therapy 01/22/2021   Varicose veins of both lower extremities with pain 07/19/2018   Arthritis of right hand 12/21/2017   Major depression in remission 12/21/2017   Fibrocystic breast changes 06/22/2017   Osteopenia 10/18/2016   Atherosclerosis of aorta 12/19/2015   Vitreous degeneration 05/21/2015   Glaucoma associated with chamber angle anomalies 05/21/2015   H/O malignant neoplasm of breast 05/21/2015   Solitary pulmonary nodule 11/07/2012   Cognitive deficits as late effect of cerebrovascular disease 01/20/2010   CVA, old, hemiparesis (HCC) 04/28/2009   Acquired hypothyroidism 06/12/2008   HLD (hyperlipidemia) 01/18/2007    Primary hypertension 01/18/2007    ONSET DATE: referral date 04/19/24  REFERRING DIAG: cognitive impairment  THERAPY DIAG:  Cognitive communication deficit  Rationale for Evaluation and Treatment Rehabilitation  SUBJECTIVE:   SUBJECTIVE STATEMENT: Pt alert, pleasant, and cooperative.  Pt accompanied by: self  PERTINENT HISTORY & DIAGNOSTIC FINDINGS: Pt is a 68 yo female who presents for cognitive-communication evaluation in setting of multifactorial cognitive impairment. Pt with PMHx including, but not limited to: stroke, depression, REM sleep behavior disorder, Crohn's disease, HLD, HTN, thyroid  disease. Pt with reports of gradual memory decline including difficulty remembering people's names, what she's doing, and losing train of thought. Pt with familial hx of dementia (father). MRI brain, 07/19/22, 1. No acute intracranial abnormality. 2. Mild chronic microvascular ischemic change.  PAIN:  Are you having pain? No   FALLS: Has patient fallen in last 6 months?  No  LIVING ENVIRONMENT: Lives with: lives with their spouse and lives with their son Lives in: House/apartment  PLOF:  Level of assistance: Independent with ADLs Employment: Retired   PATIENT GOALS   for memory to improve  OBJECTIVE:   TODAY'S TREATMENT:  Reveiwed types of attention and memory compensations. Introduced specific strategies for coding/recalling people's names. Pt demonstrated with min cues to code x4 people/names. After 10 minute delay, pt used strategies to indep recall 4/4 people/names. Pt reported successful implementation of several attention/memory strategies at home. Pt continues to describe anomic events, though not witnessed this date. Will  plan to address directly in upcoming sessions.   PATIENT EDUCATION: Education details: as above Person educated: Patient Education method: Programmer, Multimedia; Handout Education comprehension: verbalized understanding and needs further education  HOME  EXERCISE PROGRAM:        Continue implementation of attention/memory/name recall compensations    GOALS:  Goals reviewed with patient? Yes  SHORT TERM GOALS: Target date: 10 sessions  Pt will complete PROM for memory.  Baseline: Goal status: MET   2.  Pt and/or caregiver will endorse successful implementation of at least x3 strategies to improve attention/memory.  Baseline:  Goal status: INITIAL  3.  With minimal assistance, patient will establish external aid for memory/executive function and bring to more than 50% of therapy sessions.  Baseline:  Goal status: INITIAL  4.  With moderate assistance, pt will utilize preferred compensation for anomia during structured tasks.  Baseline:  Goal status: INITIAL   LONG TERM GOALS: Target date: 12 weeks  Pt will report improved feelings and/or use of compensations for memory per PROM.  Baseline:  Goal status: INITIAL  2.  Pt will demonstrate understanding of  ways to promote and support cognitive-communication outside of SLP sessions.  Baseline:  Goal status: INITIAL    ASSESSMENT:  CLINICAL IMPRESSION:  Pt is a 68 yo female who presents for cognitive-communication tx in setting of multifactorial cognitive impairment. Pt with PMHx including, but not limited to: stroke, depression, REM sleep behavior disorder, Crohn's disease, HLD, HTN, thyroid  disease. Pt with reports of gradual memory decline including difficulty remembering people's names, what she is doing, and losing train of thought. Pt with familial hx of dementia (father). MRI brain, 07/19/22, 1. No acute intracranial abnormality. 2. Mild chronic microvascular ischemic change.   Initial completed via informal means as well as standardized assessment (ACE-III). Pt presents with mild cognitive-communication deficits affecting memory, language, and visuospatial skills. Suspect higher level attention deficits (e.g. divided attention) based on pt's report. Pt also reports  anomia which was not apparent on evaluation and may also be related to higher level attention deficits. Pt endorsed CLOF is impacting her QoL both at home and in the community.    See details of today's tx above.    Recommend course of ST targeting above mentioned deficits with emphasis on education and compensation.   OBJECTIVE IMPAIRMENTS include attention, memory, and expressive language. These impairments are limiting patient from ADLs/IADLs and effectively communicating at home and in community. Factors affecting potential to achieve goals and functional outcome are co-morbidities. Patient will benefit from skilled SLP services to address above impairments and improve overall function.  REHAB POTENTIAL: Good  PLAN: SLP FREQUENCY: 2x/week  SLP DURATION: 12 weeks  PLANNED INTERVENTIONS: Cognitive reorganization, Internal/external aids, Functional tasks, Multimodal communication approach, SLP instruction and feedback, Compensatory strategies, and Patient/family education    Delon Bangs, M.S., CCC-SLP Speech-Language Pathologist [place] [facility] [phone] [place])  [place] [facility] Outpatient Rehabilitation at [facility] [address] [place], [place], [address] Phone: [phone]   Fax:  [phone]

## 2024-08-14 ENCOUNTER — Ambulatory Visit: Admitting: Speech Pathology

## 2024-08-14 DIAGNOSIS — R41841 Cognitive communication deficit: Secondary | ICD-10-CM | POA: Diagnosis not present

## 2024-08-14 NOTE — Therapy (Signed)
 OUTPATIENT SPEECH LANGUAGE PATHOLOGY  TREATMENT   Patient Name: Carolyn Campbell MRN: [id number] DOB:10-Oct-1955, 68 y.o., female Today's Date: 08/14/2024  PCP: Michelene Cower, PA-C REFERRING PROVIDER: Jannett Fairly, MD   End of Session - 08/14/24 1456     Visit Number 6    Number of Visits 24    Date for Recertification  10/17/24    Authorization Type [facility] Medicare 2025    Authorization Time Period [facility] auth#: [id number] for 16 SLP vst from 11/05-12/31    Authorization - Visit Number 6    Authorization - Number of Visits 16    Progress Note Due on Visit 10    SLP Start Time 1440    SLP Stop Time  1520    SLP Time Calculation (min) 40 min    Activity Tolerance Patient tolerated treatment well           Patient Active Problem List   Diagnosis Date Noted   Anemia 03/27/2024   REM behavioral disorder 03/14/2023   Excessive daytime sleepiness 03/14/2023   Crohn's disease of large intestine without complication (HCC) 09/30/2022   Encounter for current long-term use of anticoagulants 09/24/2022   MDD (major depressive disorder), recurrent episode, mild 08/18/2022   Restless leg syndrome 08/18/2022   Pulmonary embolism (HCC) 07/16/2022   Pain in limb 11/10/2021   CAD (coronary artery disease) 08/06/2021   Glaucoma 02/23/2021   Other long term (current) drug therapy 01/22/2021   Varicose veins of both lower extremities with pain 07/19/2018   Arthritis of right hand 12/21/2017   Major depression in remission 12/21/2017   Fibrocystic breast changes 06/22/2017   Osteopenia 10/18/2016   Atherosclerosis of aorta 12/19/2015   Vitreous degeneration 05/21/2015   Glaucoma associated with chamber angle anomalies 05/21/2015   H/O malignant neoplasm of breast 05/21/2015   Solitary pulmonary nodule 11/07/2012   Cognitive deficits as late effect of cerebrovascular disease 01/20/2010   CVA, old, hemiparesis (HCC) 04/28/2009   Acquired hypothyroidism 06/12/2008   HLD (hyperlipidemia) 01/18/2007    Primary hypertension 01/18/2007    ONSET DATE: referral date 04/19/24  REFERRING DIAG: cognitive impairment  THERAPY DIAG:  Cognitive communication deficit  Rationale for Evaluation and Treatment Rehabilitation  SUBJECTIVE:   SUBJECTIVE STATEMENT: Pt alert, pleasant, and cooperative, eager with new therapist.  Pt accompanied by: self  PERTINENT HISTORY & DIAGNOSTIC FINDINGS: Pt is a 68 yo female who presents for cognitive-communication evaluation in setting of multifactorial cognitive impairment. Pt with PMHx including, but not limited to: stroke, depression, REM sleep behavior disorder, Crohn's disease, HLD, HTN, thyroid  disease. Pt with reports of gradual memory decline including difficulty remembering people's names, what she's doing, and losing train of thought. Pt with familial hx of dementia (father). MRI brain, 07/19/22, 1. No acute intracranial abnormality. 2. Mild chronic microvascular ischemic change.  PAIN:  Are you having pain? No   FALLS: Has patient fallen in last 6 months?  No  LIVING ENVIRONMENT: Lives with: lives with their spouse and lives with their son Lives in: House/apartment  PLOF:  Level of assistance: Independent with ADLs Employment: Retired   PATIENT GOALS   for memory to improve  OBJECTIVE:   TODAY'S TREATMENT:     SLP provided organization and memory strategies targeting food preparation for Thanksgiving. Recommend pt write down schedule of when to complete steps when baking a cake including times, reduce distractions and use a checklist for specific steps.   Also introduced strategies to help with anomia including object description. With Min  A faded to supervision, pt able to complete semantic description task (with semantic constraints) using object descriptions in 97% of opportunities.    PATIENT EDUCATION: Education details: as above Person educated: Patient Education method: Programmer, Multimedia; Handout Education comprehension:  verbalized understanding and needs further education  HOME EXERCISE PROGRAM:        Continue implementation of attention/memory/name recall compensations    GOALS:  Goals reviewed with patient? Yes  SHORT TERM GOALS: Target date: 10 sessions  Pt will complete PROM for memory.  Baseline: Goal status: MET   2.  Pt and/or caregiver will endorse successful implementation of at least x3 strategies to improve attention/memory.  Baseline:  Goal status: INITIAL  3.  With minimal assistance, patient will establish external aid for memory/executive function and bring to more than 50% of therapy sessions.  Baseline:  Goal status: INITIAL  4.  With moderate assistance, pt will utilize preferred compensation for anomia during structured tasks.  Baseline:  Goal status: INITIAL   LONG TERM GOALS: Target date: 12 weeks  Pt will report improved feelings and/or use of compensations for memory per PROM.  Baseline:  Goal status: INITIAL  2.  Pt will demonstrate understanding of  ways to promote and support cognitive-communication outside of SLP sessions.  Baseline:  Goal status: INITIAL    ASSESSMENT:  CLINICAL IMPRESSION:  Pt is a 68 yo female who presents for cognitive-communication tx in setting of multifactorial cognitive impairment. Pt with PMHx including, but not limited to: stroke, depression, REM sleep behavior disorder, Crohn's disease, HLD, HTN, thyroid  disease. Pt with reports of gradual memory decline including difficulty remembering people's names, what she is doing, and losing train of thought. Pt with familial hx of dementia (father). MRI brain, 07/19/22, 1. No acute intracranial abnormality. 2. Mild chronic microvascular ischemic change.   Initial completed via informal means as well as standardized assessment (ACE-III). Pt presents with mild cognitive-communication deficits affecting memory, language, and visuospatial skills. Suspect higher level attention deficits  (e.g. divided attention) based on pt's report. Pt also reports anomia which was not apparent on evaluation and may also be related to higher level attention deficits. Pt endorsed CLOF is impacting her QoL both at home and in the community.    See details of today's tx above.    Recommend course of ST targeting above mentioned deficits with emphasis on education and compensation.   OBJECTIVE IMPAIRMENTS include attention, memory, and expressive language. These impairments are limiting patient from ADLs/IADLs and effectively communicating at home and in community. Factors affecting potential to achieve goals and functional outcome are co-morbidities. Patient will benefit from skilled SLP services to address above impairments and improve overall function.  REHAB POTENTIAL: Good  PLAN: SLP FREQUENCY: 2x/week  SLP DURATION: 12 weeks  PLANNED INTERVENTIONS: Cognitive reorganization, Internal/external aids, Functional tasks, Multimodal communication approach, SLP instruction and feedback, Compensatory strategies, and Patient/family education   Maurya Nethery B. Rubbie, M.S., CCC-SLP, Tree Surgeon Certified Brain Injury Specialist [facility]  [facility] Office [phone] Ascom [phone] Fax [phone]

## 2024-08-20 ENCOUNTER — Telehealth: Payer: Self-pay

## 2024-08-20 ENCOUNTER — Ambulatory Visit

## 2024-08-20 DIAGNOSIS — R41841 Cognitive communication deficit: Secondary | ICD-10-CM | POA: Insufficient documentation

## 2024-08-20 NOTE — Telephone Encounter (Signed)
 Pharmacy Quality Measure Review  This patient is appearing on the insurance-providing list for being at risk of failing the adherence measure for Statin Therapy for Patients with Cardiovascular Disease [facility]) medications this calendar year.   Medication: rosuvastatin  40 mg daily  Unable to find fill history via [facility], however, patient is on high intensity therapy. No action needed at this time.  Aspyn Warnke E. Marsh, PharmD, CPP Clinical Pharmacist [facility] [phone]

## 2024-08-20 NOTE — Therapy (Signed)
 OUTPATIENT SPEECH LANGUAGE PATHOLOGY  TREATMENT   Patient Name: Carolyn Campbell MRN: [id number] DOB:1955-11-23, 68 y.o., female Today's Date: 08/20/2024  PCP: Michelene Cower, PA-C REFERRING PROVIDER: Jannett Fairly, MD   End of Session - 08/20/24 1356     Visit Number 7    Number of Visits 24    Date for Recertification  10/17/24    Authorization Type [facility] Medicare 2025    Authorization Time Period [facility] auth#: [id number] for 16 SLP vst from 11/05-12/31    Authorization - Visit Number 7    Authorization - Number of Visits 16    Progress Note Due on Visit 10    SLP Start Time 1400    SLP Stop Time  1445    SLP Time Calculation (min) 45 min    Activity Tolerance Patient tolerated treatment well           Patient Active Problem List   Diagnosis Date Noted   Anemia 03/27/2024   REM behavioral disorder 03/14/2023   Excessive daytime sleepiness 03/14/2023   Crohn's disease of large intestine without complication (HCC) 09/30/2022   Encounter for current long-term use of anticoagulants 09/24/2022   MDD (major depressive disorder), recurrent episode, mild 08/18/2022   Restless leg syndrome 08/18/2022   Pulmonary embolism (HCC) 07/16/2022   Pain in limb 11/10/2021   CAD (coronary artery disease) 08/06/2021   Glaucoma 02/23/2021   Other long term (current) drug therapy 01/22/2021   Varicose veins of both lower extremities with pain 07/19/2018   Arthritis of right hand 12/21/2017   Major depression in remission 12/21/2017   Fibrocystic breast changes 06/22/2017   Osteopenia 10/18/2016   Atherosclerosis of aorta 12/19/2015   Vitreous degeneration 05/21/2015   Glaucoma associated with chamber angle anomalies 05/21/2015   H/O malignant neoplasm of breast 05/21/2015   Solitary pulmonary nodule 11/07/2012   Cognitive deficits as late effect of cerebrovascular disease 01/20/2010   CVA, old, hemiparesis (HCC) 04/28/2009   Acquired hypothyroidism 06/12/2008   HLD (hyperlipidemia) 01/18/2007    Primary hypertension 01/18/2007    ONSET DATE: referral date 04/19/24  REFERRING DIAG: cognitive impairment  THERAPY DIAG:  Cognitive communication deficit  Rationale for Evaluation and Treatment Rehabilitation  SUBJECTIVE:   SUBJECTIVE STATEMENT: Pt alert, pleasant, and cooperative.  Pt accompanied by: self  PERTINENT HISTORY & DIAGNOSTIC FINDINGS: Pt is a 68 yo female who presents for cognitive-communication evaluation in setting of multifactorial cognitive impairment. Pt with PMHx including, but not limited to: stroke, depression, REM sleep behavior disorder, Crohn's disease, HLD, HTN, thyroid  disease. Pt with reports of gradual memory decline including difficulty remembering people's names, what she's doing, and losing train of thought. Pt with familial hx of dementia (father). MRI brain, 07/19/22, 1. No acute intracranial abnormality. 2. Mild chronic microvascular ischemic change.  PAIN:  Are you having pain? No   FALLS: Has patient fallen in last 6 months?  No  LIVING ENVIRONMENT: Lives with: lives with their spouse and lives with their son Lives in: House/apartment  PLOF:  Level of assistance: Independent with ADLs Employment: Retired   PATIENT GOALS   for memory to improve  OBJECTIVE:   TODAY'S TREATMENT:  Reviewed anomia strategy of circumlocution/semantic features analysis. Pt demonstrated indep during barrier task with semantic constraints.     Reviewed attention and memory compensations with pt reporting successful implementation of several strategies as well as successful use of circumlocution in conversation with husband.   Pt expressed concern re: normal (vs atypical) cognitive changes. Further education  to be provided in upcoming sessions.   PATIENT EDUCATION: Education details: as above Person educated: Patient Education method: Programmer, Multimedia; Handout Education comprehension: verbalized understanding and needs further education  HOME EXERCISE  PROGRAM:        Continue implementation of attention/memory/name recall compensations    GOALS:  Goals reviewed with patient? Yes  SHORT TERM GOALS: Target date: 10 sessions  Pt will complete PROM for memory.  Baseline: Goal status: MET   2.  Pt and/or caregiver will endorse successful implementation of at least x3 strategies to improve attention/memory.  Baseline:  Goal status: INITIAL  3.  With minimal assistance, patient will establish external aid for memory/executive function and bring to more than 50% of therapy sessions.  Baseline:  Goal status: INITIAL  4.  With moderate assistance, pt will utilize preferred compensation for anomia during structured tasks.  Baseline:  Goal status: INITIAL   LONG TERM GOALS: Target date: 12 weeks  Pt will report improved feelings and/or use of compensations for memory per PROM.  Baseline:  Goal status: INITIAL  2.  Pt will demonstrate understanding of  ways to promote and support cognitive-communication outside of SLP sessions.  Baseline:  Goal status: INITIAL    ASSESSMENT:  CLINICAL IMPRESSION:  Pt is a 68 yo female who presents for cognitive-communication tx in setting of multifactorial cognitive impairment. Pt with PMHx including, but not limited to: stroke, depression, REM sleep behavior disorder, Crohn's disease, HLD, HTN, thyroid  disease. Pt with reports of gradual memory decline including difficulty remembering people's names, what she is doing, and losing train of thought. Pt with familial hx of dementia (father). MRI brain, 07/19/22, 1. No acute intracranial abnormality. 2. Mild chronic microvascular ischemic change.   Initial completed via informal means as well as standardized assessment (ACE-III). Pt presents with mild cognitive-communication deficits affecting memory, language, and visuospatial skills. Suspect higher level attention deficits (e.g. divided attention) based on pt's report. Pt also reports anomia  which was not apparent on evaluation and may also be related to higher level attention deficits. Pt endorsed CLOF is impacting her QoL both at home and in the community.    See details of today's tx above.    Recommend course of ST targeting above mentioned deficits with emphasis on education and compensation.   OBJECTIVE IMPAIRMENTS include attention, memory, and expressive language. These impairments are limiting patient from ADLs/IADLs and effectively communicating at home and in community. Factors affecting potential to achieve goals and functional outcome are co-morbidities. Patient will benefit from skilled SLP services to address above impairments and improve overall function.  REHAB POTENTIAL: Good  PLAN: SLP FREQUENCY: 2x/week  SLP DURATION: 12 weeks  PLANNED INTERVENTIONS: Cognitive reorganization, Internal/external aids, Functional tasks, Multimodal communication approach, SLP instruction and feedback, Compensatory strategies, and Patient/family education    Delon Bangs, M.S., CCC-SLP Speech-Language Pathologist [place] - [facility] [phone] [place])   [facility] Out[facility] Rehabilitation at [facility] [address] [place], [place], [address] Phone: [phone]   Fax:  [phone]

## 2024-08-21 ENCOUNTER — Ambulatory Visit
Admission: RE | Admit: 2024-08-21 | Discharge: 2024-08-21 | Disposition: A | Source: Ambulatory Visit | Attending: Gastroenterology

## 2024-08-21 VITALS — BP 135/53 | HR 63 | Temp 97.9°F | Resp 16 | Wt 147.0 lb

## 2024-08-21 DIAGNOSIS — K5010 Crohn's disease of large intestine without complications: Secondary | ICD-10-CM | POA: Insufficient documentation

## 2024-08-21 MED ORDER — VEDOLIZUMAB 300 MG IV SOLR
300.0000 mg | Freq: Once | INTRAVENOUS | Status: AC
  Administered 2024-08-21: 300 mg via INTRAVENOUS
  Filled 2024-08-21: qty 5

## 2024-08-21 MED ORDER — SODIUM CHLORIDE 0.9 % IV SOLN: INTRAVENOUS | Status: DC

## 2024-08-22 ENCOUNTER — Ambulatory Visit

## 2024-08-22 DIAGNOSIS — R41841 Cognitive communication deficit: Secondary | ICD-10-CM

## 2024-08-22 NOTE — Therapy (Signed)
 OUTPATIENT SPEECH LANGUAGE PATHOLOGY  TREATMENT   Patient Name: Carolyn Campbell MRN: [id number] DOB:[birth year], 68 y.o., female Today's Date: 08/22/2024  PCP: Michelene Cower, PA-C REFERRING PROVIDER: Jannett Fairly, MD   End of Session - 08/22/24 1537     Visit Number 8    Number of Visits 24    Date for Recertification  10/17/24    Authorization Type [facility] Medicare 2025    Authorization Time Period [facility] auth#: [id number] for 16 SLP vst from 11/05-12/31    Authorization - Visit Number 8    Authorization - Number of Visits 16    Progress Note Due on Visit 10    SLP Start Time 1400    SLP Stop Time  1445    SLP Time Calculation (min) 45 min    Activity Tolerance Patient tolerated treatment well           Patient Active Problem List   Diagnosis Date Noted   Anemia 03/27/2024   REM behavioral disorder 03/14/2023   Excessive daytime sleepiness 03/14/2023   Crohn's disease of large intestine without complication (HCC) 09/30/2022   Encounter for current long-term use of anticoagulants 09/24/2022   MDD (major depressive disorder), recurrent episode, mild 08/18/2022   Restless leg syndrome 08/18/2022   Pulmonary embolism (HCC) 07/16/2022   Pain in limb 11/10/2021   CAD (coronary artery disease) 08/06/2021   Glaucoma 02/23/2021   Other long term (current) drug therapy 01/22/2021   Varicose veins of both lower extremities with pain 07/19/2018   Arthritis of right hand 12/21/2017   Major depression in remission 12/21/2017   Fibrocystic breast changes 06/22/2017   Osteopenia 10/18/2016   Atherosclerosis of aorta 12/19/2015   Vitreous degeneration 05/21/2015   Glaucoma associated with chamber angle anomalies 05/21/2015   H/O malignant neoplasm of breast 05/21/2015   Solitary pulmonary nodule 11/07/2012   Cognitive deficits as late effect of cerebrovascular disease 01/20/2010   CVA, old, hemiparesis (HCC) 04/28/2009   Acquired hypothyroidism 06/12/2008   HLD (hyperlipidemia) 01/18/2007    Primary hypertension 01/18/2007    ONSET DATE: referral date 04/19/24  REFERRING DIAG: cognitive impairment  THERAPY DIAG:  Cognitive communication deficit  Rationale for Evaluation and Treatment Rehabilitation  SUBJECTIVE:   SUBJECTIVE STATEMENT: Pt alert, pleasant, and cooperative.  Pt accompanied by: self  PERTINENT HISTORY & DIAGNOSTIC FINDINGS: Pt is a 68 yo female who presents for cognitive-communication evaluation in setting of multifactorial cognitive impairment. Pt with PMHx including, but not limited to: stroke, depression, REM sleep behavior disorder, Crohn's disease, HLD, HTN, thyroid  disease. Pt with reports of gradual memory decline including difficulty remembering people's names, what she's doing, and losing train of thought. Pt with familial hx of dementia (father). MRI brain, 07/19/22, 1. No acute intracranial abnormality. 2. Mild chronic microvascular ischemic change.  PAIN:  Are you having pain? No   FALLS: Has patient fallen in last 6 months?  No  LIVING ENVIRONMENT: Lives with: lives with their spouse and lives with their son Lives in: House/apartment  PLOF:  Level of assistance: Independent with ADLs Employment: Retired   PATIENT GOALS   for memory to improve  OBJECTIVE:   TODAY'S TREATMENT:  [employer] was administered. Pt scored 50/60. This is above average for age range of 50-79.  (Norms for adults: 18-39 55.8, SD 3.8, 40-49 56.8, SD 3.0, 50-59 55.2, SD 4.0, 60-69 53.3, SD 4.6, 70-79 48.9, SD 6.3)  Number of spontaneously given correct responses: 50 Number of  stimulus cues given: 10 Number of correct responses following a stimulus cue: 0   Results of BNT explained to pt. Pt expressed concern re: normal (vs atypical) cognitive changes. Reviewed handout re: normal cognitive changes and red flags for cognitive decline.   PATIENT EDUCATION: Education details: as above Person educated:  Patient Education method: Programmer, Multimedia; Handout Education comprehension: verbalized understanding and needs further education  HOME EXERCISE PROGRAM:        Continue implementation of attention/memory/name recall compensations    GOALS:  Goals reviewed with patient? Yes  SHORT TERM GOALS: Target date: 10 sessions  Pt will complete PROM for memory.  Baseline: Goal status: MET   2.  Pt and/or caregiver will endorse successful implementation of at least x3 strategies to improve attention/memory.  Baseline:  Goal status: INITIAL  3.  With minimal assistance, patient will establish external aid for memory/executive function and bring to more than 50% of therapy sessions.  Baseline:  Goal status: INITIAL  4.  With moderate assistance, pt will utilize preferred compensation for anomia during structured tasks.  Baseline:  Goal status: INITIAL   LONG TERM GOALS: Target date: 12 weeks  Pt will report improved feelings and/or use of compensations for memory per PROM.  Baseline:  Goal status: INITIAL  2.  Pt will demonstrate understanding of  ways to promote and support cognitive-communication outside of SLP sessions.  Baseline:  Goal status: INITIAL    ASSESSMENT:  CLINICAL IMPRESSION:  Pt is a 68 yo female who presents for cognitive-communication tx in setting of multifactorial cognitive impairment. Pt with PMHx including, but not limited to: stroke, depression, REM sleep behavior disorder, Crohn's disease, HLD, HTN, thyroid  disease. Pt with reports of gradual memory decline including difficulty remembering people's names, what she is doing, and losing train of thought. Pt with familial hx of dementia (father). MRI brain, 07/19/22, 1. No acute intracranial abnormality. 2. Mild chronic microvascular ischemic change.   Initial completed via informal means as well as standardized assessment (ACE-III). Pt presents with mild cognitive-communication deficits affecting memory,  language, and visuospatial skills. Suspect higher level attention deficits (e.g. divided attention) based on pt's report. Pt also reports anomia which was not apparent on evaluation and may also be related to higher level attention deficits. Pt endorsed CLOF is impacting her QoL both at home and in the community.    See details of today's tx above.    Recommend course of ST targeting above mentioned deficits with emphasis on education and compensation.   OBJECTIVE IMPAIRMENTS include attention, memory, and expressive language. These impairments are limiting patient from ADLs/IADLs and effectively communicating at home and in community. Factors affecting potential to achieve goals and functional outcome are co-morbidities. Patient will benefit from skilled SLP services to address above impairments and improve overall function.  REHAB POTENTIAL: Good  PLAN: SLP FREQUENCY: 2x/week  SLP DURATION: 12 weeks  PLANNED INTERVENTIONS: Cognitive reorganization, Internal/external aids, Functional tasks, Multimodal communication approach, SLP instruction and feedback, Compensatory strategies, and Patient/family education    Delon Bangs, M.S., CCC-SLP Speech-Language Pathologist [place] - [facility] [phone] [place])  [facility] Out[facility] Rehabilitation at [facility] [address] [place], [place], [address] Phone: [phone]   Fax:  [phone]

## 2024-08-27 ENCOUNTER — Ambulatory Visit

## 2024-08-27 DIAGNOSIS — R41841 Cognitive communication deficit: Secondary | ICD-10-CM | POA: Diagnosis not present

## 2024-08-27 NOTE — Therapy (Signed)
 OUTPATIENT SPEECH LANGUAGE PATHOLOGY  TREATMENT   Patient Name: Carolyn Campbell MRN: [id number] DOB:[birth year], 68 y.o., female Today's Date: 08/27/2024  PCP: Michelene Cower, PA-C REFERRING PROVIDER: Jannett Fairly, MD   End of Session - 08/27/24 1443     Visit Number 9    Number of Visits 24    Date for Recertification  10/17/24    Authorization Type [facility] Medicare 2025    Authorization Time Period [facility] auth#: [id number] for 16 SLP vst from 11/05-12/31    Authorization - Visit Number 9    Authorization - Number of Visits 16    Progress Note Due on Visit 10    SLP Start Time 1403    SLP Stop Time  1445    SLP Time Calculation (min) 42 min    Activity Tolerance Patient tolerated treatment well           Patient Active Problem List   Diagnosis Date Noted   Anemia 03/27/2024   REM behavioral disorder 03/14/2023   Excessive daytime sleepiness 03/14/2023   Crohn's disease of large intestine without complication (HCC) 09/30/2022   Encounter for current long-term use of anticoagulants 09/24/2022   MDD (major depressive disorder), recurrent episode, mild 08/18/2022   Restless leg syndrome 08/18/2022   Pulmonary embolism (HCC) 07/16/2022   Pain in limb 11/10/2021   CAD (coronary artery disease) 08/06/2021   Glaucoma 02/23/2021   Other long term (current) drug therapy 01/22/2021   Varicose veins of both lower extremities with pain 07/19/2018   Arthritis of right hand 12/21/2017   Major depression in remission 12/21/2017   Fibrocystic breast changes 06/22/2017   Osteopenia 10/18/2016   Atherosclerosis of aorta 12/19/2015   Vitreous degeneration 05/21/2015   Glaucoma associated with chamber angle anomalies 05/21/2015   H/O malignant neoplasm of breast 05/21/2015   Solitary pulmonary nodule 11/07/2012   Cognitive deficits as late effect of cerebrovascular disease 01/20/2010   CVA, old, hemiparesis (HCC) 04/28/2009   Acquired hypothyroidism 06/12/2008   HLD (hyperlipidemia) 01/18/2007    Primary hypertension 01/18/2007    ONSET DATE: referral date 04/19/24  REFERRING DIAG: cognitive impairment  THERAPY DIAG:  Cognitive communication deficit  Rationale for Evaluation and Treatment Rehabilitation  SUBJECTIVE:   SUBJECTIVE STATEMENT: Pt alert, pleasant, and cooperative.  Pt accompanied by: self  PERTINENT HISTORY & DIAGNOSTIC FINDINGS: Pt is a 68 yo female who presents for cognitive-communication evaluation in setting of multifactorial cognitive impairment. Pt with PMHx including, but not limited to: stroke, depression, REM sleep behavior disorder, Crohn's disease, HLD, HTN, thyroid  disease. Pt with reports of gradual memory decline including difficulty remembering people's names, what she's doing, and losing train of thought. Pt with familial hx of dementia (father). MRI brain, 07/19/22, 1. No acute intracranial abnormality. 2. Mild chronic microvascular ischemic change.  PAIN:  Are you having pain? No   FALLS: Has patient fallen in last 6 months?  No  LIVING ENVIRONMENT: Lives with: lives with their spouse and lives with their son Lives in: House/apartment  PLOF:  Level of assistance: Independent with ADLs Employment: Retired   PATIENT GOALS   for memory to improve  OBJECTIVE:   TODAY'S TREATMENT:  Introduced ways to promote cognitive functioning. Discussion re: importance of mental stimulation, physical activity, social connection, and managing health conditions. Handout provided.   PATIENT EDUCATION: Education details: as above Person educated: Patient Education method: Explanation; Handout Education comprehension: verbalized understanding and needs further education  HOME EXERCISE PROGRAM:        Continue  implementation of attention/memory/name recall compensations  ID x2 ways to promote cognitive functioning outside of ST    GOALS:  Goals reviewed with patient? Yes  SHORT TERM GOALS: Target date: 10 sessions  Pt will complete PROM  for memory.  Baseline: Goal status: MET   2.  Pt and/or caregiver will endorse successful implementation of at least x3 strategies to improve attention/memory.  Baseline:  Goal status: INITIAL  3.  With minimal assistance, patient will establish external aid for memory/executive function and bring to more than 50% of therapy sessions.  Baseline:  Goal status: INITIAL  4.  With moderate assistance, pt will utilize preferred compensation for anomia during structured tasks.  Baseline:  Goal status: INITIAL   LONG TERM GOALS: Target date: 12 weeks  Pt will report improved feelings and/or use of compensations for memory per PROM.  Baseline:  Goal status: INITIAL  2.  Pt will demonstrate understanding of  ways to promote and support cognitive-communication outside of SLP sessions.  Baseline:  Goal status: INITIAL    ASSESSMENT:  CLINICAL IMPRESSION:  Pt is a 68 yo female who presents for cognitive-communication tx in setting of multifactorial cognitive impairment. Pt with PMHx including, but not limited to: stroke, depression, REM sleep behavior disorder, Crohn's disease, HLD, HTN, thyroid  disease. Pt with reports of gradual memory decline including difficulty remembering people's names, what she is doing, and losing train of thought. Pt with familial hx of dementia (father). MRI brain, 07/19/22, 1. No acute intracranial abnormality. 2. Mild chronic microvascular ischemic change.   Initial completed via informal means as well as standardized assessment (ACE-III). Pt presents with mild cognitive-communication deficits affecting memory, language, and visuospatial skills. Suspect higher level attention deficits (e.g. divided attention) based on pt's report. Pt also reports anomia which was not apparent on evaluation and may also be related to higher level attention deficits. Pt endorsed CLOF is impacting her QoL both at home and in the community.    See details of today's tx above.     Recommend course of ST targeting above mentioned deficits with emphasis on education and compensation.   OBJECTIVE IMPAIRMENTS include attention, memory, and expressive language. These impairments are limiting patient from ADLs/IADLs and effectively communicating at home and in community. Factors affecting potential to achieve goals and functional outcome are co-morbidities. Patient will benefit from skilled SLP services to address above impairments and improve overall function.  REHAB POTENTIAL: Good  PLAN: SLP FREQUENCY: 2x/week  SLP DURATION: 12 weeks  PLANNED INTERVENTIONS: Cognitive reorganization, Internal/external aids, Functional tasks, Multimodal communication approach, SLP instruction and feedback, Compensatory strategies, and Patient/family education    Delon Bangs, M.S., CCC-SLP Speech-Language Pathologist [place] - [facility] [phone] [place])  [facility] Outpatient Rehabilitation at [facility] [address] [place], [place], [address] Phone: [phone]   Fax:  [phone]

## 2024-08-29 ENCOUNTER — Ambulatory Visit

## 2024-09-03 ENCOUNTER — Ambulatory Visit

## 2024-09-05 ENCOUNTER — Ambulatory Visit

## 2024-09-10 ENCOUNTER — Ambulatory Visit

## 2024-09-10 DIAGNOSIS — R41841 Cognitive communication deficit: Secondary | ICD-10-CM | POA: Diagnosis not present

## 2024-09-10 NOTE — Therapy (Signed)
 " OUTPATIENT SPEECH LANGUAGE PATHOLOGY  TREATMENT / PROGRESS NOTE   Patient Name: Carolyn Campbell MRN: [id number] DOB:1955/10/14, 68 y.o., female Today's Date: 09/10/2024  PCP: Michelene Cower, PA-C REFERRING PROVIDER: Jannett Fairly, MD  Speech Therapy Progress Note  Dates of Reporting Period: 07/25/24 to 09/10/24.  Objective: Patient has been seen for 10 speech therapy sessions this reporting period targeting cognitive-communication. Patient is making progress toward LTGs and met 3/4 STGs this reporting period. See skilled intervention, clinical impressions, and goals below for details.    End of Session - 09/10/24 1056     Visit Number 10    Number of Visits 24    Date for Recertification  10/17/24    Authorization Type [facility] Medicare 2025    Authorization Time Period [facility] auth#: [id number] for 16 SLP vst from 11/05-12/31    Authorization - Visit Number 10    Authorization - Number of Visits 16    Progress Note Due on Visit 20    SLP Start Time 1100    SLP Stop Time  1145    SLP Time Calculation (min) 45 min    Activity Tolerance Patient tolerated treatment well           Patient Active Problem List   Diagnosis Date Noted   Anemia 03/27/2024   REM behavioral disorder 03/14/2023   Excessive daytime sleepiness 03/14/2023   Crohn's disease of large intestine without complication (HCC) 09/30/2022   Encounter for current long-term use of anticoagulants 09/24/2022   MDD (major depressive disorder), recurrent episode, mild 08/18/2022   Restless leg syndrome 08/18/2022   Pulmonary embolism (HCC) 07/16/2022   Pain in limb 11/10/2021   CAD (coronary artery disease) 08/06/2021   Glaucoma 02/23/2021   Other long term (current) drug therapy 01/22/2021   Varicose veins of both lower extremities with pain 07/19/2018   Arthritis of right hand 12/21/2017   Major depression in remission 12/21/2017   Fibrocystic breast changes 06/22/2017   Osteopenia 10/18/2016   Atherosclerosis of aorta  12/19/2015   Vitreous degeneration 05/21/2015   Glaucoma associated with chamber angle anomalies 05/21/2015   H/O malignant neoplasm of breast 05/21/2015   Solitary pulmonary nodule 11/07/2012   Cognitive deficits as late effect of cerebrovascular disease 01/20/2010   CVA, old, hemiparesis (HCC) 04/28/2009   Acquired hypothyroidism 06/12/2008   HLD (hyperlipidemia) 01/18/2007   Primary hypertension 01/18/2007    ONSET DATE: referral date 04/19/24  REFERRING DIAG: cognitive impairment  THERAPY DIAG:  Cognitive communication deficit  Rationale for Evaluation and Treatment Rehabilitation  SUBJECTIVE:   SUBJECTIVE STATEMENT: Pt alert, pleasant, and cooperative.  Pt accompanied by: self  PERTINENT HISTORY & DIAGNOSTIC FINDINGS: Pt is a 68 yo female who presents for cognitive-communication evaluation in setting of multifactorial cognitive impairment. Pt with PMHx including, but not limited to: stroke, depression, REM sleep behavior disorder, Crohn's disease, HLD, HTN, thyroid  disease. Pt with reports of gradual memory decline including difficulty remembering people's names, what she's doing, and losing train of thought. Pt with familial hx of dementia (father). MRI brain, 07/19/22, 1. No acute intracranial abnormality. 2. Mild chronic microvascular ischemic change.  PAIN:  Are you having pain? No   FALLS: Has patient fallen in last 6 months?  No  LIVING ENVIRONMENT: Lives with: lives with their spouse and lives with their son Lives in: House/apartment  PLOF:  Level of assistance: Independent with ADLs Employment: Retired   PATIENT GOALS   for memory to improve  OBJECTIVE:   TODAY'S TREATMENT:  Introduced Education Officer, Museum for improved executive functioning. Pt generated real-life scenarios where STAR method may be helpful. Pt walked through baking a cake with the STAR method with min cues. Handout provided.   Pt reported a rough patch with cognition which she attributed to  medication changes, but has since noted improvement since ceasing to take medication. Encouraged pt to contact doctor for any medication changes/side effects.   PATIENT EDUCATION: Education details: as above Person educated: Patient Education method: Programmer, Multimedia; Handout Education comprehension: verbalized understanding and needs further education  HOME EXERCISE PROGRAM:        Continue implementation of attention/memory/name recall compensations  ID x2 ways to promote cognitive functioning outside of ST    GOALS:  Goals reviewed with patient? Yes  SHORT TERM GOALS: Target date: 10 sessions  Pt will complete PROM for memory.  Baseline: Goal status: MET   2.  Pt and/or caregiver will endorse successful implementation of at least x3 strategies to improve attention/memory.  Baseline:  Goal status: MET  3.  With minimal assistance, patient will establish external aid for memory/executive function and bring to more than 50% of therapy sessions.  Baseline:  Goal status: PROGRESSING  4.  With moderate assistance, pt will utilize preferred compensation for anomia during structured tasks.  Baseline:  Goal status: MET   LONG TERM GOALS: Target date: 12 weeks  Pt will report improved feelings and/or use of compensations for memory per PROM.  Baseline:  Goal status: PROGRESSING  2.  Pt will demonstrate understanding of  ways to promote and support cognitive-communication outside of SLP sessions.  Baseline:  Goal status: PROGRESSING    ASSESSMENT:  CLINICAL IMPRESSION:  Pt is a 68 yo female who presents for cognitive-communication tx in setting of multifactorial cognitive impairment. Pt with PMHx including, but not limited to: stroke, depression, REM sleep behavior disorder, Crohn's disease, HLD, HTN, thyroid  disease. Pt with reports of gradual memory decline including difficulty remembering people's names, what she is doing, and losing train of thought. Pt with familial hx of  dementia (father). MRI brain, 07/19/22, 1. No acute intracranial abnormality. 2. Mild chronic microvascular ischemic change.   Initial completed via informal means as well as standardized assessment (ACE-III). Pt presents with mild cognitive-communication deficits affecting memory, language, and visuospatial skills. Suspect higher level attention deficits (e.g. divided attention) based on pt's report. Pt also reports anomia which was not apparent on evaluation and may also be related to higher level attention deficits. Pt endorsed CLOF is impacting her QoL both at home and in the community.    See details of today's tx above.    Recommend course of ST targeting above mentioned deficits with emphasis on education and compensation.   OBJECTIVE IMPAIRMENTS include attention, memory, and expressive language. These impairments are limiting patient from ADLs/IADLs and effectively communicating at home and in community. Factors affecting potential to achieve goals and functional outcome are co-morbidities. Patient will benefit from skilled SLP services to address above impairments and improve overall function.  REHAB POTENTIAL: Good  PLAN: SLP FREQUENCY: 2x/week  SLP DURATION: 12 weeks  PLANNED INTERVENTIONS: Cognitive reorganization, Internal/external aids, Functional tasks, Multimodal communication approach, SLP instruction and feedback, Compensatory strategies, and Patient/family education    Delon Bangs, M.S., CCC-SLP Speech-Language Pathologist [facility] [phone] [place])  [facility] Outpatient Rehabilitation at [facility] [address] [place], [place], [address] Phone: [phone]   Fax:  [phone]            "

## 2024-09-12 ENCOUNTER — Ambulatory Visit

## 2024-09-18 ENCOUNTER — Ambulatory Visit

## 2024-09-18 DIAGNOSIS — R41841 Cognitive communication deficit: Secondary | ICD-10-CM

## 2024-09-18 NOTE — Therapy (Signed)
 " OUTPATIENT SPEECH LANGUAGE PATHOLOGY  TREATMENT   Patient Name: Carolyn Campbell MRN: [id number] DOB:Jul 24, 1956, 68 y.o., female Today's Date: 09/18/2024  PCP: Michelene Cower, PA-C REFERRING PROVIDER: Jannett Fairly, MD    End of Session - 09/18/24 1354     Visit Number 11    Number of Visits 24    Date for Recertification  10/17/24    Authorization Type [facility] Medicare 2025    Authorization Time Period [facility] auth#: [id number] for 16 SLP vst from 11/05-12/31    Authorization - Visit Number 11    Authorization - Number of Visits 16    Progress Note Due on Visit 20    SLP Start Time 1355    SLP Stop Time  1440    SLP Time Calculation (min) 45 min    Activity Tolerance Patient tolerated treatment well           Patient Active Problem List   Diagnosis Date Noted   Anemia 03/27/2024   REM behavioral disorder 03/14/2023   Excessive daytime sleepiness 03/14/2023   Crohn's disease of large intestine without complication (HCC) 09/30/2022   Encounter for current long-term use of anticoagulants 09/24/2022   MDD (major depressive disorder), recurrent episode, mild 08/18/2022   Restless leg syndrome 08/18/2022   Pulmonary embolism (HCC) 07/16/2022   Pain in limb 11/10/2021   CAD (coronary artery disease) 08/06/2021   Glaucoma 02/23/2021   Other long term (current) drug therapy 01/22/2021   Varicose veins of both lower extremities with pain 07/19/2018   Arthritis of right hand 12/21/2017   Major depression in remission 12/21/2017   Fibrocystic breast changes 06/22/2017   Osteopenia 10/18/2016   Atherosclerosis of aorta 12/19/2015   Vitreous degeneration 05/21/2015   Glaucoma associated with chamber angle anomalies 05/21/2015   H/O malignant neoplasm of breast 05/21/2015   Solitary pulmonary nodule 11/07/2012   Cognitive deficits as late effect of cerebrovascular disease 01/20/2010   CVA, old, hemiparesis (HCC) 04/28/2009   Acquired hypothyroidism 06/12/2008   HLD (hyperlipidemia)  01/18/2007   Primary hypertension 01/18/2007    ONSET DATE: referral date 04/19/24  REFERRING DIAG: cognitive impairment  THERAPY DIAG:  Cognitive communication deficit  Rationale for Evaluation and Treatment Rehabilitation  SUBJECTIVE:   SUBJECTIVE STATEMENT: Pt alert, pleasant, and cooperative.  Pt accompanied by: self  PERTINENT HISTORY & DIAGNOSTIC FINDINGS: Pt is a 68 yo female who presents for cognitive-communication evaluation in setting of multifactorial cognitive impairment. Pt with PMHx including, but not limited to: stroke, depression, REM sleep behavior disorder, Crohn's disease, HLD, HTN, thyroid  disease. Pt with reports of gradual memory decline including difficulty remembering people's names, what she's doing, and losing train of thought. Pt with familial hx of dementia (father). MRI brain, 07/19/22, 1. No acute intracranial abnormality. 2. Mild chronic microvascular ischemic change.  PAIN:  Are you having pain? No   FALLS: Has patient fallen in last 6 months?  No  LIVING ENVIRONMENT: Lives with: lives with their spouse and lives with their son Lives in: House/apartment  PLOF:  Level of assistance: Independent with ADLs Employment: Retired   PATIENT GOALS   for memory to improve  OBJECTIVE:   TODAY'S TREATMENT:   MULTIFACTORIAL MEMORY QUESTIONNAIRE (MMQ)  Administered patient self-reported outcome measure Multifactorial Memory Questionnaire (MMQ). The Multifactorial Memory Questionnaire [facility]) consists of three scales measuring separate aspects of metamemory; Satisfaction, Ability and Strategy.   Pt's responses are converted to T-Scores with severity levels based on pt's T-Score.   Severity Levels (T-score) Very Low - <  20 Low - 20 to 29 Below Average - 30-39 Average - 40 to 60 Above Average - 60 to 70 High - 71 to 80 Very High - > 80  Pt reports:  Average - 40 to 60 Memory Satisfaction (T-score: 53) Average - 40 to 60 Memory Ability  (T-score: 46) Average - 40 to 60 use of Memory Strategies (T-score: 51)   Discussed results of PROM, progress to date, and SLP POC. Pt would like to discuss ways to promote cognition during next session, prior to d/c.    PATIENT EDUCATION: Education details: as above Person educated: Patient Education method: Programmer, Multimedia; Handout Education comprehension: verbalized understanding and needs further education  HOME EXERCISE PROGRAM:        Continue implementation of attention/memory/name recall compensations      GOALS:  Goals reviewed with patient? Yes  SHORT TERM GOALS: Target date: 10 sessions  Pt will complete PROM for memory.  Baseline: Goal status: MET   2.  Pt and/or caregiver will endorse successful implementation of at least x3 strategies to improve attention/memory.  Baseline:  Goal status: MET  3.  With minimal assistance, patient will establish external aid for memory/executive function and bring to more than 50% of therapy sessions.  Baseline:  Goal status: PROGRESSING  4.  With moderate assistance, pt will utilize preferred compensation for anomia during structured tasks.  Baseline:  Goal status: MET   LONG TERM GOALS: Target date: 12 weeks  Pt will report improved feelings and/or use of compensations for memory per PROM.  Baseline:  Goal status: MET  2.  Pt will demonstrate understanding of  ways to promote and support cognitive-communication outside of SLP sessions.  Baseline:  Goal status: PROGRESSING    ASSESSMENT:  CLINICAL IMPRESSION:  Pt is a 68 yo female who presents for cognitive-communication tx in setting of multifactorial cognitive impairment. Pt with PMHx including, but not limited to: stroke, depression, REM sleep behavior disorder, Crohn's disease, HLD, HTN, thyroid  disease. Pt with reports of gradual memory decline including difficulty remembering people's names, what she is doing, and losing train of thought. Pt with familial hx of  dementia (father). MRI brain, 07/19/22, 1. No acute intracranial abnormality. 2. Mild chronic microvascular ischemic change.   Initial completed via informal means as well as standardized assessment (ACE-III). Pt presents with mild cognitive-communication deficits affecting memory, language, and visuospatial skills. Suspect higher level attention deficits (e.g. divided attention) based on pt's report. Pt also reports anomia which was not apparent on evaluation and may also be related to higher level attention deficits. Pt endorsed CLOF is impacting her QoL both at home and in the community.    See details of today's tx above.    Recommend course of ST targeting above mentioned deficits with emphasis on education and compensation.   OBJECTIVE IMPAIRMENTS include attention, memory, and expressive language. These impairments are limiting patient from ADLs/IADLs and effectively communicating at home and in community. Factors affecting potential to achieve goals and functional outcome are co-morbidities. Patient will benefit from skilled SLP services to address above impairments and improve overall function.  REHAB POTENTIAL: Good  PLAN: SLP FREQUENCY: 2x/week  SLP DURATION: 12 weeks  PLANNED INTERVENTIONS: Cognitive reorganization, Internal/external aids, Functional tasks, Multimodal communication approach, SLP instruction and feedback, Compensatory strategies, and Patient/family education    Delon Bangs, M.S., CCC-SLP Speech-Language Pathologist [facility] [phone] [place])  [facility] Out[facility] Rehabilitation at [facility] [address] [place],  [place], [address] Phone: [phone]   Fax:  [phone]            "

## 2024-09-20 ENCOUNTER — Encounter: Payer: Self-pay | Admitting: Gastroenterology

## 2024-09-24 ENCOUNTER — Ambulatory Visit: Attending: Neurology

## 2024-09-24 DIAGNOSIS — R41841 Cognitive communication deficit: Secondary | ICD-10-CM | POA: Diagnosis present

## 2024-09-24 NOTE — Therapy (Addendum)
 " OUTPATIENT SPEECH LANGUAGE PATHOLOGY  TREATMENT / DISCHARGE SUMMARY   Patient Name: Carolyn Campbell MRN: [id number] DOB:[birth year], 69 y.o., female Today's Date: 09/24/2024  PCP: Michelene Cower, PA-C REFERRING PROVIDER: Jannett Fairly, MD    End of Session - 09/24/24 1533     Visit Number 12    Number of Visits 24    Date for Recertification  10/17/24    Authorization - Visit Number 12    Authorization - Number of Visits 16           Patient Active Problem List   Diagnosis Date Noted   Anemia 03/27/2024   REM behavioral disorder 03/14/2023   Excessive daytime sleepiness 03/14/2023   Crohn's disease of large intestine without complication (HCC) 09/30/2022   Encounter for current long-term use of anticoagulants 09/24/2022   MDD (major depressive disorder), recurrent episode, mild 08/18/2022   Restless leg syndrome 08/18/2022   Pulmonary embolism (HCC) 07/16/2022   Pain in limb 11/10/2021   CAD (coronary artery disease) 08/06/2021   Glaucoma 02/23/2021   Other long term (current) drug therapy 01/22/2021   Varicose veins of both lower extremities with pain 07/19/2018   Arthritis of right hand 12/21/2017   Major depression in remission 12/21/2017   Fibrocystic breast changes 06/22/2017   Osteopenia 10/18/2016   Atherosclerosis of aorta 12/19/2015   Vitreous degeneration 05/21/2015   Glaucoma associated with chamber angle anomalies 05/21/2015   H/O malignant neoplasm of breast 05/21/2015   Solitary pulmonary nodule 11/07/2012   Cognitive deficits as late effect of cerebrovascular disease 01/20/2010   CVA, old, hemiparesis (HCC) 04/28/2009   Acquired hypothyroidism 06/12/2008   HLD (hyperlipidemia) 01/18/2007   Primary hypertension 01/18/2007    ONSET DATE: referral date 04/19/24  REFERRING DIAG: cognitive impairment  THERAPY DIAG:  Cognitive communication deficit  Rationale for Evaluation and Treatment Rehabilitation  SUBJECTIVE:   SUBJECTIVE STATEMENT: Pt alert,  pleasant, and cooperative.  Pt accompanied by: self  PERTINENT HISTORY & DIAGNOSTIC FINDINGS: Pt is a 69 yo female who presents for cognitive-communication evaluation in setting of multifactorial cognitive impairment. Pt with PMHx including, but not limited to: stroke, depression, REM sleep behavior disorder, Crohn's disease, HLD, HTN, thyroid  disease. Pt with reports of gradual memory decline including difficulty remembering people's names, what she's doing, and losing train of thought. Pt with familial hx of dementia (father). MRI brain, 07/19/22, 1. No acute intracranial abnormality. 2. Mild chronic microvascular ischemic change.  PAIN:  Are you having pain? No   FALLS: Has patient fallen in last 6 months?  No  LIVING ENVIRONMENT: Lives with: lives with their spouse and lives with their son Lives in: House/apartment  PLOF:  Level of assistance: Independent with ADLs Employment: Retired   PATIENT GOALS   for memory to improve  OBJECTIVE:   TODAY'S TREATMENT:  Reviewed progress to date, attention/memory/executive functioning compensations, and ways to promote cognitive-communication at d/c. Pt participated in meaningful dialogue about progress and successes since starting ST. Pt agrees that she is ready to d/c from ST at this time.   PATIENT EDUCATION: Education details: as above Person educated: Patient Education method: Explanation; Handout Education comprehension: verbalized understanding  HOME EXERCISE PROGRAM:        Continue implementation of strategies        GOALS:  Goals reviewed with patient? Yes  SHORT TERM GOALS: Target date: 10 sessions  Pt will complete PROM for memory.  Baseline: Goal status: MET   2.  Pt and/or caregiver will endorse successful  implementation of at least x3 strategies to improve attention/memory.  Baseline:  Goal status: MET  3.  With minimal assistance, patient will establish external aid for memory/executive function and  bring to more than 50% of therapy sessions.  Baseline:  Goal status: MET  4.  With moderate assistance, pt will utilize preferred compensation for anomia during structured tasks.  Baseline:  Goal status: MET   LONG TERM GOALS: Target date: 12 weeks  Pt will report improved feelings and/or use of compensations for memory per PROM.  Baseline:  Goal status: MET  2.  Pt will demonstrate understanding of  ways to promote and support cognitive-communication outside of SLP sessions.  Baseline:  Goal status: MET    ASSESSMENT:  CLINICAL IMPRESSION:  Pt is a 69 yo female who presents for cognitive-communication tx in setting of multifactorial cognitive impairment. Pt with PMHx including, but not limited to: stroke, depression, REM sleep behavior disorder, Crohn's disease, HLD, HTN, thyroid  disease. Pt with reports of gradual memory decline including difficulty remembering people's names, what she is doing, and losing train of thought. Pt with familial hx of dementia (father). MRI brain, 07/19/22, 1. No acute intracranial abnormality. 2. Mild chronic microvascular ischemic change.   Initial completed via informal means as well as standardized assessment (ACE-III). Pt presents with mild cognitive-communication deficits affecting memory, language, and visuospatial skills. Suspect higher level attention deficits (e.g. divided attention) based on pt's report. Pt also reports anomia which was not apparent on evaluation and may also be related to higher level attention deficits. Pt endorsed CLOF is impacting her QoL both at home and in the community.    See details of today's session above.   Pt has participated in course of ST targeting above mentioned deficits with emphasis on education and compensation and has met all ST goals. Pt is now d/c'd from ST services.     PLAN: D/C ST; all goals met    Delon Bangs, M.S., CCC-SLP Speech-Language Pathologist [facility] [phone] [place])  [facility] Outpatient Rehabilitation at [facility] [address] [place], [place], [address] Phone: [phone]   Fax:  [phone]            "

## 2024-09-26 ENCOUNTER — Ambulatory Visit

## 2024-09-28 ENCOUNTER — Encounter: Admitting: Family Medicine

## 2024-10-01 ENCOUNTER — Ambulatory Visit

## 2024-10-02 ENCOUNTER — Ambulatory Visit
Admission: RE | Admit: 2024-10-02 | Discharge: 2024-10-02 | Disposition: A | Discharge status: Home / Self Care | Source: Ambulatory Visit | Attending: Gastroenterology | Admitting: Gastroenterology

## 2024-10-02 VITALS — BP 126/52 | HR 65 | Temp 97.8°F | Resp 16

## 2024-10-02 DIAGNOSIS — K5010 Crohn's disease of large intestine without complications: Secondary | ICD-10-CM | POA: Diagnosis present

## 2024-10-02 MED ORDER — SODIUM CHLORIDE 0.9 % IV SOLN: INTRAVENOUS | Status: DC

## 2024-10-02 MED ORDER — VEDOLIZUMAB 300 MG IV SOLR
300.0000 mg | Freq: Once | INTRAVENOUS | Status: AC
  Administered 2024-10-02: 300 mg via INTRAVENOUS
  Filled 2024-10-02: qty 5

## 2024-10-03 ENCOUNTER — Ambulatory Visit

## 2024-10-04 ENCOUNTER — Ambulatory Visit (INDEPENDENT_AMBULATORY_CARE_PROVIDER_SITE_OTHER): Admitting: Professional Counselor

## 2024-10-04 DIAGNOSIS — F439 Reaction to severe stress, unspecified: Secondary | ICD-10-CM
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
## 2024-10-08 ENCOUNTER — Ambulatory Visit

## 2024-10-10 ENCOUNTER — Ambulatory Visit: Admitting: Speech Pathology

## 2024-10-11 ENCOUNTER — Ambulatory Visit (INDEPENDENT_AMBULATORY_CARE_PROVIDER_SITE_OTHER): Admitting: Professional Counselor

## 2024-10-11 DIAGNOSIS — F439 Reaction to severe stress, unspecified: Secondary | ICD-10-CM | POA: Diagnosis not present
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
## 2024-10-15 ENCOUNTER — Ambulatory Visit

## 2024-10-17 ENCOUNTER — Ambulatory Visit

## 2024-10-18 ENCOUNTER — Ambulatory Visit: Admitting: Professional Counselor

## 2024-10-22 ENCOUNTER — Ambulatory Visit

## 2024-10-22 ENCOUNTER — Encounter: Payer: Self-pay | Admitting: Internal Medicine

## 2024-10-22 DIAGNOSIS — E039 Hypothyroidism, unspecified: Secondary | ICD-10-CM

## 2024-10-23 MED ORDER — ROSUVASTATIN CALCIUM 40 MG PO TABS: 40.0000 mg | ORAL_TABLET | Freq: Every day | ORAL | 0 refills | Status: AC

## 2024-10-23 MED ORDER — LEVOTHYROXINE SODIUM 50 MCG PO TABS: ORAL_TABLET | ORAL | 0 refills | Status: AC

## 2024-10-24 ENCOUNTER — Ambulatory Visit

## 2024-10-29 ENCOUNTER — Ambulatory Visit

## 2024-10-31 ENCOUNTER — Ambulatory Visit

## 2024-11-01 ENCOUNTER — Ambulatory Visit: Admitting: Professional Counselor

## 2024-11-13 ENCOUNTER — Ambulatory Visit

## 2024-12-06 ENCOUNTER — Encounter: Admitting: Internal Medicine

## 2025-01-11 ENCOUNTER — Ambulatory Visit: Admitting: Oncology

## 2025-01-11 ENCOUNTER — Other Ambulatory Visit

## 2025-07-11 ENCOUNTER — Ambulatory Visit
